# Patient Record
Sex: Female | Born: 1940 | Race: White | Hispanic: No | Smoking: Former smoker
Health system: Southern US, Community
[De-identification: ages and names within clinical notes are randomized; demographics above are authoritative.]

## PROBLEM LIST (undated history)

## (undated) DIAGNOSIS — Z8709 Personal history of other diseases of the respiratory system: Secondary | ICD-10-CM

## (undated) DIAGNOSIS — R519 Headache, unspecified: Secondary | ICD-10-CM

## (undated) DIAGNOSIS — G8929 Other chronic pain: Secondary | ICD-10-CM

## (undated) DIAGNOSIS — M1990 Unspecified osteoarthritis, unspecified site: Secondary | ICD-10-CM

## (undated) DIAGNOSIS — J449 Chronic obstructive pulmonary disease, unspecified: Secondary | ICD-10-CM

## (undated) DIAGNOSIS — Z8739 Personal history of other diseases of the musculoskeletal system and connective tissue: Secondary | ICD-10-CM

## (undated) DIAGNOSIS — R531 Weakness: Secondary | ICD-10-CM

## (undated) DIAGNOSIS — D649 Anemia, unspecified: Secondary | ICD-10-CM

## (undated) DIAGNOSIS — M51369 Other intervertebral disc degeneration, lumbar region without mention of lumbar back pain or lower extremity pain: Secondary | ICD-10-CM

## (undated) DIAGNOSIS — M2550 Pain in unspecified joint: Secondary | ICD-10-CM

## (undated) DIAGNOSIS — K219 Gastro-esophageal reflux disease without esophagitis: Secondary | ICD-10-CM

## (undated) DIAGNOSIS — R011 Cardiac murmur, unspecified: Secondary | ICD-10-CM

## (undated) DIAGNOSIS — M8580 Other specified disorders of bone density and structure, unspecified site: Secondary | ICD-10-CM

## (undated) DIAGNOSIS — I10 Essential (primary) hypertension: Secondary | ICD-10-CM

## (undated) DIAGNOSIS — M810 Age-related osteoporosis without current pathological fracture: Secondary | ICD-10-CM

## (undated) DIAGNOSIS — F329 Major depressive disorder, single episode, unspecified: Secondary | ICD-10-CM

## (undated) DIAGNOSIS — F419 Anxiety disorder, unspecified: Secondary | ICD-10-CM

## (undated) DIAGNOSIS — F32A Depression, unspecified: Secondary | ICD-10-CM

## (undated) DIAGNOSIS — R51 Headache: Secondary | ICD-10-CM

## (undated) DIAGNOSIS — Z8719 Personal history of other diseases of the digestive system: Secondary | ICD-10-CM

## (undated) DIAGNOSIS — J82 Pulmonary eosinophilia, not elsewhere classified: Secondary | ICD-10-CM

## (undated) DIAGNOSIS — S12100A Unspecified displaced fracture of second cervical vertebra, initial encounter for closed fracture: Secondary | ICD-10-CM

## (undated) DIAGNOSIS — M5136 Other intervertebral disc degeneration, lumbar region: Secondary | ICD-10-CM

## (undated) DIAGNOSIS — M660 Rupture of popliteal cyst: Secondary | ICD-10-CM

## (undated) DIAGNOSIS — N390 Urinary tract infection, site not specified: Secondary | ICD-10-CM

## (undated) DIAGNOSIS — T7840XA Allergy, unspecified, initial encounter: Secondary | ICD-10-CM

## (undated) DIAGNOSIS — H269 Unspecified cataract: Secondary | ICD-10-CM

## (undated) DIAGNOSIS — R32 Unspecified urinary incontinence: Secondary | ICD-10-CM

## (undated) DIAGNOSIS — J189 Pneumonia, unspecified organism: Secondary | ICD-10-CM

## (undated) DIAGNOSIS — R42 Dizziness and giddiness: Secondary | ICD-10-CM

## (undated) HISTORY — DX: Age-related osteoporosis without current pathological fracture: M81.0

## (undated) HISTORY — DX: Anxiety disorder, unspecified: F41.9

## (undated) HISTORY — PX: ADENOIDECTOMY: SUR15

## (undated) HISTORY — DX: Unspecified osteoarthritis, unspecified site: M19.90

## (undated) HISTORY — DX: Headache, unspecified: R51.9

## (undated) HISTORY — DX: Major depressive disorder, single episode, unspecified: F32.9

## (undated) HISTORY — PX: TONSILLECTOMY: SUR1361

## (undated) HISTORY — DX: Other chronic pain: G89.29

## (undated) HISTORY — PX: NASAL SINUS SURGERY: SHX719

## (undated) HISTORY — PX: CATARACT EXTRACTION W/ INTRAOCULAR LENS  IMPLANT, BILATERAL: SHX1307

## (undated) HISTORY — DX: Other intervertebral disc degeneration, lumbar region: M51.36

## (undated) HISTORY — DX: Rupture of popliteal cyst: M66.0

## (undated) HISTORY — DX: Unspecified displaced fracture of second cervical vertebra, initial encounter for closed fracture: S12.100A

## (undated) HISTORY — DX: Allergy, unspecified, initial encounter: T78.40XA

## (undated) HISTORY — DX: Unspecified cataract: H26.9

## (undated) HISTORY — DX: Headache: R51

## (undated) HISTORY — PX: ESOPHAGEAL DILATION: SHX303

## (undated) HISTORY — DX: Depression, unspecified: F32.A

## (undated) HISTORY — DX: Gastro-esophageal reflux disease without esophagitis: K21.9

## (undated) HISTORY — DX: Essential (primary) hypertension: I10

## (undated) HISTORY — PX: POLYPECTOMY: SHX149

## (undated) HISTORY — PX: COLONOSCOPY: SHX174

## (undated) HISTORY — PX: TONSILLECTOMY AND ADENOIDECTOMY: SHX28

## (undated) HISTORY — DX: Other intervertebral disc degeneration, lumbar region without mention of lumbar back pain or lower extremity pain: M51.369

## (undated) HISTORY — DX: Anemia, unspecified: D64.9

## (undated) HISTORY — PX: UPPER GASTROINTESTINAL ENDOSCOPY: SHX188

## (undated) HISTORY — PX: TOTAL ABDOMINAL HYSTERECTOMY: SHX209

## (undated) HISTORY — DX: Pulmonary eosinophilia, not elsewhere classified: J82

## (undated) HISTORY — DX: Chronic obstructive pulmonary disease, unspecified: J44.9

## (undated) HISTORY — PX: APPENDECTOMY: SHX54

## (undated) HISTORY — DX: Other specified disorders of bone density and structure, unspecified site: M85.80

---

## 1998-07-09 ENCOUNTER — Ambulatory Visit (HOSPITAL_COMMUNITY): Admission: RE | Admit: 1998-07-09 | Discharge: 1998-07-09 | Payer: Self-pay | Admitting: Internal Medicine

## 1998-08-29 ENCOUNTER — Other Ambulatory Visit: Admission: RE | Admit: 1998-08-29 | Discharge: 1998-08-29 | Payer: Self-pay | Admitting: Otolaryngology

## 1999-10-29 ENCOUNTER — Ambulatory Visit (HOSPITAL_COMMUNITY): Admission: RE | Admit: 1999-10-29 | Discharge: 1999-10-29 | Payer: Self-pay | Admitting: Internal Medicine

## 2005-05-09 ENCOUNTER — Ambulatory Visit: Payer: Self-pay | Admitting: Internal Medicine

## 2005-05-20 ENCOUNTER — Ambulatory Visit: Payer: Self-pay | Admitting: Internal Medicine

## 2005-06-02 ENCOUNTER — Ambulatory Visit: Payer: Self-pay | Admitting: Internal Medicine

## 2005-06-12 ENCOUNTER — Ambulatory Visit: Payer: Self-pay | Admitting: Internal Medicine

## 2005-09-23 ENCOUNTER — Ambulatory Visit: Payer: Self-pay | Admitting: Family Medicine

## 2006-09-15 ENCOUNTER — Ambulatory Visit: Payer: Self-pay | Admitting: Internal Medicine

## 2006-09-22 ENCOUNTER — Ambulatory Visit: Payer: Self-pay | Admitting: Internal Medicine

## 2006-11-05 ENCOUNTER — Ambulatory Visit: Payer: Self-pay | Admitting: Internal Medicine

## 2007-01-14 ENCOUNTER — Ambulatory Visit: Payer: Self-pay | Admitting: Internal Medicine

## 2007-06-07 ENCOUNTER — Encounter: Payer: Self-pay | Admitting: Internal Medicine

## 2007-06-09 DIAGNOSIS — K2100 Gastro-esophageal reflux disease with esophagitis, without bleeding: Secondary | ICD-10-CM | POA: Insufficient documentation

## 2007-06-09 DIAGNOSIS — M1990 Unspecified osteoarthritis, unspecified site: Secondary | ICD-10-CM | POA: Insufficient documentation

## 2007-06-30 ENCOUNTER — Ambulatory Visit: Payer: Self-pay | Admitting: Internal Medicine

## 2007-07-27 ENCOUNTER — Encounter: Payer: Self-pay | Admitting: Internal Medicine

## 2007-09-03 ENCOUNTER — Encounter: Payer: Self-pay | Admitting: Internal Medicine

## 2007-09-13 ENCOUNTER — Ambulatory Visit: Payer: Self-pay | Admitting: Family Medicine

## 2007-09-13 ENCOUNTER — Encounter (INDEPENDENT_AMBULATORY_CARE_PROVIDER_SITE_OTHER): Payer: Self-pay | Admitting: *Deleted

## 2007-09-29 ENCOUNTER — Ambulatory Visit: Payer: Self-pay | Admitting: Internal Medicine

## 2007-09-29 DIAGNOSIS — K219 Gastro-esophageal reflux disease without esophagitis: Secondary | ICD-10-CM | POA: Insufficient documentation

## 2007-09-30 ENCOUNTER — Encounter: Payer: Self-pay | Admitting: Internal Medicine

## 2007-10-04 ENCOUNTER — Encounter (INDEPENDENT_AMBULATORY_CARE_PROVIDER_SITE_OTHER): Payer: Self-pay | Admitting: *Deleted

## 2007-10-04 LAB — CONVERTED CEMR LAB
ALT: 29 units/L (ref 0–35)
AST: 34 units/L (ref 0–37)
Albumin: 4.1 g/dL (ref 3.5–5.2)
Alkaline Phosphatase: 65 units/L (ref 39–117)
BUN: 11 mg/dL (ref 6–23)
Basophils Absolute: 0 10*3/uL (ref 0.0–0.1)
Basophils Relative: 0.4 % (ref 0.0–1.0)
Bilirubin, Direct: 0.1 mg/dL (ref 0.0–0.3)
CO2: 31 meq/L (ref 19–32)
Calcium: 9.7 mg/dL (ref 8.4–10.5)
Chloride: 107 meq/L (ref 96–112)
Cholesterol: 197 mg/dL (ref 0–200)
Creatinine, Ser: 0.6 mg/dL (ref 0.4–1.2)
Eosinophils Absolute: 0.6 10*3/uL (ref 0.0–0.6)
Eosinophils Relative: 8.7 % — ABNORMAL HIGH (ref 0.0–5.0)
GFR calc Af Amer: 129 mL/min
GFR calc non Af Amer: 107 mL/min
Glucose, Bld: 102 mg/dL — ABNORMAL HIGH (ref 70–99)
HCT: 35.7 % — ABNORMAL LOW (ref 36.0–46.0)
HDL: 52.4 mg/dL (ref >39.0)
Hemoglobin: 12.6 g/dL (ref 12.0–15.0)
LDL Cholesterol: 113 mg/dL — ABNORMAL HIGH (ref 0–99)
Lymphocytes Relative: 20.7 % (ref 12.0–46.0)
MCHC: 35.3 g/dL (ref 30.0–36.0)
MCV: 94.6 fL (ref 78.0–100.0)
Monocytes Absolute: 0.6 10*3/uL (ref 0.2–0.7)
Monocytes Relative: 8.6 % (ref 3.0–11.0)
Neutro Abs: 4 10*3/uL (ref 1.4–7.7)
Neutrophils Relative %: 61.6 % (ref 43.0–77.0)
Platelets: 214 10*3/uL (ref 150–400)
Potassium: 4 meq/L (ref 3.5–5.1)
RBC: 3.78 M/uL — ABNORMAL LOW (ref 3.87–5.11)
RDW: 13.2 % (ref 11.5–14.6)
Sodium: 143 meq/L (ref 135–145)
TSH: 2.26 microintl units/mL (ref 0.35–5.50)
Total Bilirubin: 0.8 mg/dL (ref 0.3–1.2)
Total CHOL/HDL Ratio: 3.8
Total Protein: 7.6 g/dL (ref 6.0–8.3)
Triglycerides: 160 mg/dL — ABNORMAL HIGH (ref 0–149)
VLDL: 32 mg/dL (ref 0–40)
WBC: 6.6 10*3/uL (ref 4.5–10.5)

## 2007-10-22 ENCOUNTER — Ambulatory Visit: Payer: Self-pay | Admitting: Internal Medicine

## 2007-10-25 ENCOUNTER — Encounter (INDEPENDENT_AMBULATORY_CARE_PROVIDER_SITE_OTHER): Payer: Self-pay | Admitting: *Deleted

## 2007-11-02 ENCOUNTER — Ambulatory Visit: Payer: Self-pay | Admitting: Internal Medicine

## 2007-11-04 ENCOUNTER — Ambulatory Visit: Payer: Self-pay | Admitting: Internal Medicine

## 2007-11-09 ENCOUNTER — Ambulatory Visit: Payer: Self-pay | Admitting: Internal Medicine

## 2007-11-10 ENCOUNTER — Encounter (INDEPENDENT_AMBULATORY_CARE_PROVIDER_SITE_OTHER): Payer: Self-pay | Admitting: *Deleted

## 2007-11-10 LAB — CONVERTED CEMR LAB
Basophils Absolute: 0 10*3/uL (ref 0.0–0.1)
Basophils Relative: 0.7 % (ref 0.0–1.0)
Eosinophils Absolute: 0.7 10*3/uL — ABNORMAL HIGH (ref 0.0–0.6)
Eosinophils Relative: 12.2 % — ABNORMAL HIGH (ref 0.0–5.0)
Folate: 15.7 ng/mL
HCT: 36.6 % (ref 36.0–46.0)
Hemoglobin: 12.9 g/dL (ref 12.0–15.0)
Iron: 71 ug/dL (ref 42–145)
Lymphocytes Relative: 21.4 % (ref 12.0–46.0)
MCHC: 35.2 g/dL (ref 30.0–36.0)
MCV: 95.4 fL (ref 78.0–100.0)
Monocytes Absolute: 0.6 10*3/uL (ref 0.2–0.7)
Monocytes Relative: 10.3 % (ref 3.0–11.0)
Neutro Abs: 3.3 10*3/uL (ref 1.4–7.7)
Neutrophils Relative %: 55.4 % (ref 43.0–77.0)
Platelets: 214 10*3/uL (ref 150–400)
RBC: 3.83 M/uL — ABNORMAL LOW (ref 3.87–5.11)
RDW: 13.4 % (ref 11.5–14.6)
Vitamin B-12: 335 pg/mL (ref 211–911)
WBC: 5.9 10*3/uL (ref 4.5–10.5)

## 2007-11-19 ENCOUNTER — Ambulatory Visit: Payer: Self-pay | Admitting: Internal Medicine

## 2007-11-19 ENCOUNTER — Encounter: Payer: Self-pay | Admitting: Internal Medicine

## 2007-11-23 ENCOUNTER — Telehealth (INDEPENDENT_AMBULATORY_CARE_PROVIDER_SITE_OTHER): Payer: Self-pay | Admitting: *Deleted

## 2007-12-13 ENCOUNTER — Ambulatory Visit: Payer: Self-pay | Admitting: Internal Medicine

## 2008-02-14 ENCOUNTER — Telehealth: Payer: Self-pay | Admitting: Internal Medicine

## 2008-03-21 ENCOUNTER — Telehealth (INDEPENDENT_AMBULATORY_CARE_PROVIDER_SITE_OTHER): Payer: Self-pay | Admitting: *Deleted

## 2008-04-03 ENCOUNTER — Ambulatory Visit: Payer: Self-pay | Admitting: Internal Medicine

## 2008-04-16 LAB — CONVERTED CEMR LAB
Cholesterol: 195 mg/dL (ref 0–200)
Direct LDL: 102.7 mg/dL
HDL: 45.6 mg/dL (ref >39.0)
Total CHOL/HDL Ratio: 4.3
Triglycerides: 247 mg/dL (ref 0–149)
VLDL: 49 mg/dL — ABNORMAL HIGH (ref 0–40)

## 2008-04-17 ENCOUNTER — Encounter (INDEPENDENT_AMBULATORY_CARE_PROVIDER_SITE_OTHER): Payer: Self-pay | Admitting: *Deleted

## 2008-05-17 ENCOUNTER — Telehealth (INDEPENDENT_AMBULATORY_CARE_PROVIDER_SITE_OTHER): Payer: Self-pay | Admitting: *Deleted

## 2008-05-19 ENCOUNTER — Ambulatory Visit: Payer: Self-pay | Admitting: Internal Medicine

## 2008-05-19 DIAGNOSIS — T887XXA Unspecified adverse effect of drug or medicament, initial encounter: Secondary | ICD-10-CM | POA: Insufficient documentation

## 2008-05-19 DIAGNOSIS — R7989 Other specified abnormal findings of blood chemistry: Secondary | ICD-10-CM | POA: Insufficient documentation

## 2008-05-21 LAB — CONVERTED CEMR LAB
BUN: 10 mg/dL (ref 6–23)
Creatinine, Ser: 0.7 mg/dL (ref 0.4–1.2)
Hgb A1c MFr Bld: 5.8 % (ref 4.6–6.0)
Potassium: 4.4 meq/L (ref 3.5–5.1)

## 2008-05-23 ENCOUNTER — Encounter (INDEPENDENT_AMBULATORY_CARE_PROVIDER_SITE_OTHER): Payer: Self-pay | Admitting: *Deleted

## 2008-06-07 ENCOUNTER — Telehealth (INDEPENDENT_AMBULATORY_CARE_PROVIDER_SITE_OTHER): Payer: Self-pay | Admitting: *Deleted

## 2008-06-12 ENCOUNTER — Telehealth (INDEPENDENT_AMBULATORY_CARE_PROVIDER_SITE_OTHER): Payer: Self-pay | Admitting: *Deleted

## 2008-06-12 ENCOUNTER — Ambulatory Visit: Payer: Self-pay | Admitting: Internal Medicine

## 2008-06-12 DIAGNOSIS — R002 Palpitations: Secondary | ICD-10-CM | POA: Insufficient documentation

## 2008-06-26 ENCOUNTER — Ambulatory Visit: Payer: Self-pay | Admitting: Internal Medicine

## 2008-06-28 ENCOUNTER — Encounter (INDEPENDENT_AMBULATORY_CARE_PROVIDER_SITE_OTHER): Payer: Self-pay | Admitting: *Deleted

## 2008-06-28 LAB — CONVERTED CEMR LAB
BUN: 16 mg/dL (ref 6–23)
Calcium: 9.1 mg/dL (ref 8.4–10.5)
Creatinine, Ser: 0.8 mg/dL (ref 0.4–1.2)
Magnesium: 2.3 mg/dL (ref 1.5–2.5)
Potassium: 4.5 meq/L (ref 3.5–5.1)

## 2008-07-03 ENCOUNTER — Ambulatory Visit: Payer: Self-pay

## 2008-07-03 ENCOUNTER — Encounter: Payer: Self-pay | Admitting: Internal Medicine

## 2008-07-17 ENCOUNTER — Ambulatory Visit: Payer: Self-pay | Admitting: Internal Medicine

## 2008-07-17 ENCOUNTER — Telehealth (INDEPENDENT_AMBULATORY_CARE_PROVIDER_SITE_OTHER): Payer: Self-pay | Admitting: *Deleted

## 2008-07-21 ENCOUNTER — Telehealth (INDEPENDENT_AMBULATORY_CARE_PROVIDER_SITE_OTHER): Payer: Self-pay | Admitting: *Deleted

## 2008-08-04 ENCOUNTER — Ambulatory Visit: Payer: Self-pay | Admitting: Internal Medicine

## 2008-08-04 LAB — CONVERTED CEMR LAB
Cholesterol: 261 mg/dL (ref 0–200)
Direct LDL: 109.4 mg/dL
HDL: 49.1 mg/dL (ref >39.0)
Hgb A1c MFr Bld: 5.8 % (ref 4.6–6.0)
Total CHOL/HDL Ratio: 5.3
Triglycerides: 475 mg/dL (ref 0–149)
VLDL: 95 mg/dL — ABNORMAL HIGH (ref 0–40)

## 2008-08-07 ENCOUNTER — Telehealth (INDEPENDENT_AMBULATORY_CARE_PROVIDER_SITE_OTHER): Payer: Self-pay | Admitting: *Deleted

## 2008-08-09 ENCOUNTER — Ambulatory Visit: Payer: Self-pay | Admitting: Internal Medicine

## 2008-08-09 DIAGNOSIS — I10 Essential (primary) hypertension: Secondary | ICD-10-CM | POA: Insufficient documentation

## 2008-08-09 DIAGNOSIS — E781 Pure hyperglyceridemia: Secondary | ICD-10-CM | POA: Insufficient documentation

## 2008-08-10 ENCOUNTER — Encounter (INDEPENDENT_AMBULATORY_CARE_PROVIDER_SITE_OTHER): Payer: Self-pay | Admitting: *Deleted

## 2008-08-17 ENCOUNTER — Encounter: Payer: Self-pay | Admitting: Internal Medicine

## 2008-08-17 ENCOUNTER — Encounter: Admission: RE | Admit: 2008-08-17 | Discharge: 2008-09-19 | Payer: Self-pay | Admitting: Internal Medicine

## 2008-08-24 ENCOUNTER — Ambulatory Visit: Payer: Self-pay | Admitting: Cardiovascular Disease

## 2008-08-29 HISTORY — PX: SHOULDER SURGERY: SHX246

## 2008-09-06 ENCOUNTER — Ambulatory Visit: Payer: Self-pay

## 2008-09-06 ENCOUNTER — Encounter: Payer: Self-pay | Admitting: Cardiovascular Disease

## 2008-09-06 ENCOUNTER — Ambulatory Visit: Payer: Self-pay | Admitting: Cardiovascular Disease

## 2008-09-19 ENCOUNTER — Ambulatory Visit: Payer: Self-pay | Admitting: Cardiovascular Disease

## 2008-09-19 LAB — CONVERTED CEMR LAB
BUN: 11 mg/dL (ref 6–23)
CO2: 29 meq/L (ref 19–32)
Calcium: 9.6 mg/dL (ref 8.4–10.5)
Chloride: 105 meq/L (ref 96–112)
Creatinine, Ser: 0.7 mg/dL (ref 0.4–1.2)
GFR calc Af Amer: 108 mL/min
GFR calc non Af Amer: 89 mL/min
Glucose, Bld: 107 mg/dL — ABNORMAL HIGH (ref 70–99)
Potassium: 3.5 meq/L (ref 3.5–5.1)
Sodium: 140 meq/L (ref 135–145)

## 2008-09-29 ENCOUNTER — Ambulatory Visit: Payer: Self-pay | Admitting: Internal Medicine

## 2008-09-29 DIAGNOSIS — E785 Hyperlipidemia, unspecified: Secondary | ICD-10-CM | POA: Insufficient documentation

## 2008-09-29 DIAGNOSIS — F329 Major depressive disorder, single episode, unspecified: Secondary | ICD-10-CM | POA: Insufficient documentation

## 2008-09-29 DIAGNOSIS — F419 Anxiety disorder, unspecified: Secondary | ICD-10-CM | POA: Insufficient documentation

## 2008-09-29 DIAGNOSIS — R7309 Other abnormal glucose: Secondary | ICD-10-CM | POA: Insufficient documentation

## 2008-09-29 DIAGNOSIS — F411 Generalized anxiety disorder: Secondary | ICD-10-CM | POA: Insufficient documentation

## 2008-09-29 DIAGNOSIS — M25519 Pain in unspecified shoulder: Secondary | ICD-10-CM | POA: Insufficient documentation

## 2008-10-09 ENCOUNTER — Encounter (INDEPENDENT_AMBULATORY_CARE_PROVIDER_SITE_OTHER): Payer: Self-pay | Admitting: *Deleted

## 2008-10-09 ENCOUNTER — Telehealth (INDEPENDENT_AMBULATORY_CARE_PROVIDER_SITE_OTHER): Payer: Self-pay | Admitting: *Deleted

## 2008-10-09 LAB — CONVERTED CEMR LAB
ALT: 43 units/L — ABNORMAL HIGH (ref 0–35)
AST: 41 units/L — ABNORMAL HIGH (ref 0–37)
Albumin: 4.4 g/dL (ref 3.5–5.2)
Alkaline Phosphatase: 86 units/L (ref 39–117)
Basophils Absolute: 0 10*3/uL (ref 0.0–0.1)
Basophils Relative: 0.1 % (ref 0.0–3.0)
Bilirubin, Direct: 0.2 mg/dL (ref 0.0–0.3)
Cholesterol: 241 mg/dL (ref 0–200)
Direct LDL: 139.6 mg/dL
Eosinophils Absolute: 0.7 10*3/uL (ref 0.0–0.7)
Eosinophils Relative: 10.2 % — ABNORMAL HIGH (ref 0.0–5.0)
HCT: 37 % (ref 36.0–46.0)
HDL: 65.6 mg/dL (ref >39.0)
Hemoglobin: 12.6 g/dL (ref 12.0–15.0)
Hgb A1c MFr Bld: 5.3 % (ref 4.6–6.0)
Lymphocytes Relative: 18.4 % (ref 12.0–46.0)
MCHC: 34.1 g/dL (ref 30.0–36.0)
MCV: 96.5 fL (ref 78.0–100.0)
Monocytes Absolute: 0.6 10*3/uL (ref 0.1–1.0)
Monocytes Relative: 9.8 % (ref 3.0–12.0)
Neutro Abs: 3.9 10*3/uL (ref 1.4–7.7)
Neutrophils Relative %: 61.5 % (ref 43.0–77.0)
Platelets: 226 10*3/uL (ref 150–400)
RBC: 3.83 M/uL — ABNORMAL LOW (ref 3.87–5.11)
RDW: 14 % (ref 11.5–14.6)
TSH: 1.71 microintl units/mL (ref 0.35–5.50)
Total Bilirubin: 0.8 mg/dL (ref 0.3–1.2)
Total CHOL/HDL Ratio: 3.7
Total Protein: 7.8 g/dL (ref 6.0–8.3)
Triglycerides: 119 mg/dL (ref 0–149)
VLDL: 24 mg/dL (ref 0–40)
WBC: 6.4 10*3/uL (ref 4.5–10.5)

## 2008-10-13 ENCOUNTER — Encounter: Admission: RE | Admit: 2008-10-13 | Discharge: 2008-11-20 | Payer: Self-pay | Admitting: Internal Medicine

## 2008-10-19 ENCOUNTER — Encounter: Admission: RE | Admit: 2008-10-19 | Discharge: 2008-10-19 | Payer: Self-pay | Admitting: Internal Medicine

## 2008-10-24 ENCOUNTER — Encounter: Payer: Self-pay | Admitting: Internal Medicine

## 2008-10-24 ENCOUNTER — Telehealth (INDEPENDENT_AMBULATORY_CARE_PROVIDER_SITE_OTHER): Payer: Self-pay | Admitting: *Deleted

## 2008-10-26 ENCOUNTER — Telehealth (INDEPENDENT_AMBULATORY_CARE_PROVIDER_SITE_OTHER): Payer: Self-pay | Admitting: *Deleted

## 2008-10-26 ENCOUNTER — Encounter (INDEPENDENT_AMBULATORY_CARE_PROVIDER_SITE_OTHER): Payer: Self-pay | Admitting: *Deleted

## 2008-10-26 ENCOUNTER — Ambulatory Visit: Payer: Self-pay | Admitting: Family Medicine

## 2008-10-27 ENCOUNTER — Telehealth (INDEPENDENT_AMBULATORY_CARE_PROVIDER_SITE_OTHER): Payer: Self-pay | Admitting: *Deleted

## 2008-11-03 ENCOUNTER — Ambulatory Visit: Payer: Self-pay | Admitting: Cardiovascular Disease

## 2008-11-20 ENCOUNTER — Encounter: Payer: Self-pay | Admitting: Internal Medicine

## 2009-01-17 ENCOUNTER — Telehealth (INDEPENDENT_AMBULATORY_CARE_PROVIDER_SITE_OTHER): Payer: Self-pay | Admitting: *Deleted

## 2009-03-09 ENCOUNTER — Telehealth (INDEPENDENT_AMBULATORY_CARE_PROVIDER_SITE_OTHER): Payer: Self-pay | Admitting: *Deleted

## 2009-04-20 ENCOUNTER — Ambulatory Visit: Payer: Self-pay | Admitting: Family Medicine

## 2009-05-04 ENCOUNTER — Ambulatory Visit: Payer: Self-pay | Admitting: Internal Medicine

## 2009-05-04 DIAGNOSIS — M109 Gout, unspecified: Secondary | ICD-10-CM | POA: Insufficient documentation

## 2009-05-05 LAB — CONVERTED CEMR LAB
BUN: 14 mg/dL (ref 6–23)
Basophils Absolute: 0 10*3/uL (ref 0.0–0.1)
Basophils Relative: 0.1 % (ref 0.0–3.0)
Cholesterol: 183 mg/dL (ref 0–200)
Creatinine, Ser: 0.7 mg/dL (ref 0.4–1.2)
Eosinophils Absolute: 0.4 10*3/uL (ref 0.0–0.7)
Eosinophils Relative: 7.4 % — ABNORMAL HIGH (ref 0.0–5.0)
HCT: 36 % (ref 36.0–46.0)
HDL: 59.5 mg/dL (ref >39.00)
Hemoglobin: 12.3 g/dL (ref 12.0–15.0)
Hgb A1c MFr Bld: 5.4 % (ref 4.6–6.5)
LDL Cholesterol: 103 mg/dL — ABNORMAL HIGH (ref 0–99)
Lymphocytes Relative: 18.5 % (ref 12.0–46.0)
Lymphs Abs: 1 10*3/uL (ref 0.7–4.0)
MCHC: 34.3 g/dL (ref 30.0–36.0)
MCV: 94.6 fL (ref 78.0–100.0)
Monocytes Absolute: 0.5 10*3/uL (ref 0.1–1.0)
Monocytes Relative: 9.4 % (ref 3.0–12.0)
Neutro Abs: 3.6 10*3/uL (ref 1.4–7.7)
Neutrophils Relative %: 64.6 % (ref 43.0–77.0)
Platelets: 167 10*3/uL (ref 150.0–400.0)
Potassium: 4.4 meq/L (ref 3.5–5.1)
RBC: 3.8 M/uL — ABNORMAL LOW (ref 3.87–5.11)
RDW: 13.4 % (ref 11.5–14.6)
Total CHOL/HDL Ratio: 3
Triglycerides: 103 mg/dL (ref 0.0–149.0)
Uric Acid, Serum: 4 mg/dL (ref 2.4–7.0)
VLDL: 20.6 mg/dL (ref 0.0–40.0)
WBC: 5.5 10*3/uL (ref 4.5–10.5)

## 2009-05-07 ENCOUNTER — Encounter (INDEPENDENT_AMBULATORY_CARE_PROVIDER_SITE_OTHER): Payer: Self-pay | Admitting: *Deleted

## 2009-05-12 ENCOUNTER — Encounter: Payer: Self-pay | Admitting: Internal Medicine

## 2009-05-16 ENCOUNTER — Telehealth (INDEPENDENT_AMBULATORY_CARE_PROVIDER_SITE_OTHER): Payer: Self-pay | Admitting: *Deleted

## 2009-06-22 ENCOUNTER — Encounter: Payer: Self-pay | Admitting: Family Medicine

## 2009-09-08 ENCOUNTER — Encounter: Payer: Self-pay | Admitting: Internal Medicine

## 2009-10-15 ENCOUNTER — Telehealth (INDEPENDENT_AMBULATORY_CARE_PROVIDER_SITE_OTHER): Payer: Self-pay | Admitting: *Deleted

## 2009-11-02 ENCOUNTER — Encounter: Payer: Self-pay | Admitting: Internal Medicine

## 2009-11-09 ENCOUNTER — Encounter (INDEPENDENT_AMBULATORY_CARE_PROVIDER_SITE_OTHER): Payer: Self-pay | Admitting: *Deleted

## 2009-11-27 ENCOUNTER — Encounter: Payer: Self-pay | Admitting: Internal Medicine

## 2009-11-27 DIAGNOSIS — N63 Unspecified lump in unspecified breast: Secondary | ICD-10-CM | POA: Insufficient documentation

## 2009-12-10 ENCOUNTER — Telehealth: Payer: Self-pay | Admitting: Internal Medicine

## 2009-12-26 ENCOUNTER — Ambulatory Visit: Payer: Self-pay | Admitting: Internal Medicine

## 2009-12-26 DIAGNOSIS — M26629 Arthralgia of temporomandibular joint, unspecified side: Secondary | ICD-10-CM | POA: Insufficient documentation

## 2009-12-27 ENCOUNTER — Encounter (INDEPENDENT_AMBULATORY_CARE_PROVIDER_SITE_OTHER): Payer: Self-pay | Admitting: *Deleted

## 2010-01-01 ENCOUNTER — Ambulatory Visit: Payer: Self-pay | Admitting: Internal Medicine

## 2010-01-01 DIAGNOSIS — M8580 Other specified disorders of bone density and structure, unspecified site: Secondary | ICD-10-CM | POA: Insufficient documentation

## 2010-01-01 DIAGNOSIS — H15009 Unspecified scleritis, unspecified eye: Secondary | ICD-10-CM | POA: Insufficient documentation

## 2010-01-04 ENCOUNTER — Telehealth (INDEPENDENT_AMBULATORY_CARE_PROVIDER_SITE_OTHER): Payer: Self-pay | Admitting: *Deleted

## 2010-05-02 ENCOUNTER — Telehealth (INDEPENDENT_AMBULATORY_CARE_PROVIDER_SITE_OTHER): Payer: Self-pay | Admitting: *Deleted

## 2010-05-20 ENCOUNTER — Telehealth (INDEPENDENT_AMBULATORY_CARE_PROVIDER_SITE_OTHER): Payer: Self-pay | Admitting: *Deleted

## 2010-05-30 ENCOUNTER — Telehealth (INDEPENDENT_AMBULATORY_CARE_PROVIDER_SITE_OTHER): Payer: Self-pay | Admitting: *Deleted

## 2010-06-04 ENCOUNTER — Telehealth (INDEPENDENT_AMBULATORY_CARE_PROVIDER_SITE_OTHER): Payer: Self-pay | Admitting: *Deleted

## 2010-07-17 ENCOUNTER — Telehealth (INDEPENDENT_AMBULATORY_CARE_PROVIDER_SITE_OTHER): Payer: Self-pay | Admitting: *Deleted

## 2010-11-04 ENCOUNTER — Telehealth (INDEPENDENT_AMBULATORY_CARE_PROVIDER_SITE_OTHER): Payer: Self-pay | Admitting: *Deleted

## 2010-11-06 ENCOUNTER — Ambulatory Visit: Payer: Self-pay | Admitting: Internal Medicine

## 2010-11-06 ENCOUNTER — Telehealth: Payer: Self-pay | Admitting: Internal Medicine

## 2010-11-07 ENCOUNTER — Encounter: Payer: Self-pay | Admitting: Internal Medicine

## 2010-11-11 ENCOUNTER — Ambulatory Visit: Payer: Self-pay | Admitting: Internal Medicine

## 2010-11-15 ENCOUNTER — Ambulatory Visit: Payer: Self-pay | Admitting: Internal Medicine

## 2010-11-15 ENCOUNTER — Encounter (INDEPENDENT_AMBULATORY_CARE_PROVIDER_SITE_OTHER): Payer: Self-pay | Admitting: *Deleted

## 2010-11-19 LAB — CONVERTED CEMR LAB
Basophils Absolute: 0 10*3/uL (ref 0.0–0.1)
Basophils Relative: 0 % (ref 0–1)
Eosinophils Absolute: 0.2 10*3/uL (ref 0.0–0.7)
Eosinophils Relative: 2 % (ref 0–5)
HCT: 40.5 % (ref 36.0–46.0)
Hemoglobin: 13.3 g/dL (ref 12.0–15.0)
Hgb A1c MFr Bld: 6.2 % — ABNORMAL HIGH (ref <5.7)
Lymphocytes Relative: 25 % (ref 12–46)
Lymphs Abs: 2.6 10*3/uL (ref 0.7–4.0)
MCHC: 32.8 g/dL (ref 30.0–36.0)
MCV: 93.1 fL (ref 78.0–100.0)
Monocytes Absolute: 1.5 10*3/uL — ABNORMAL HIGH (ref 0.1–1.0)
Monocytes Relative: 15 % — ABNORMAL HIGH (ref 3–12)
Neutro Abs: 5.8 10*3/uL (ref 1.7–7.7)
Neutrophils Relative %: 58 % (ref 43–77)
Platelets: 363 10*3/uL (ref 150–400)
RBC: 4.35 M/uL (ref 3.87–5.11)
RDW: 14.4 % (ref 11.5–15.5)
WBC: 10.1 10*3/uL (ref 4.0–10.5)

## 2010-11-25 ENCOUNTER — Telehealth: Payer: Self-pay | Admitting: Internal Medicine

## 2010-11-25 ENCOUNTER — Ambulatory Visit: Payer: Self-pay | Admitting: Internal Medicine

## 2010-11-28 ENCOUNTER — Ambulatory Visit: Payer: Self-pay | Admitting: Internal Medicine

## 2010-11-28 DIAGNOSIS — J45998 Other asthma: Secondary | ICD-10-CM | POA: Insufficient documentation

## 2010-11-28 DIAGNOSIS — R5383 Other fatigue: Secondary | ICD-10-CM

## 2010-11-28 DIAGNOSIS — R5381 Other malaise: Secondary | ICD-10-CM | POA: Insufficient documentation

## 2010-11-28 DIAGNOSIS — R739 Hyperglycemia, unspecified: Secondary | ICD-10-CM | POA: Insufficient documentation

## 2010-12-02 LAB — CONVERTED CEMR LAB
BUN: 15 mg/dL (ref 6–23)
Basophils Absolute: 0 10*3/uL (ref 0.0–0.1)
Basophils Relative: 0.4 % (ref 0.0–3.0)
CO2: 27 meq/L (ref 19–32)
Calcium: 8.9 mg/dL (ref 8.4–10.5)
Chloride: 104 meq/L (ref 96–112)
Cortisol, Plasma: 12 ug/dL
Creatinine, Ser: 0.9 mg/dL (ref 0.4–1.2)
Eosinophils Absolute: 1.1 10*3/uL — ABNORMAL HIGH (ref 0.0–0.7)
Eosinophils Relative: 13.7 % — ABNORMAL HIGH (ref 0.0–5.0)
GFR calc non Af Amer: 70.78 mL/min (ref >60)
Glucose, Bld: 98 mg/dL (ref 70–99)
HCT: 38.5 % (ref 36.0–46.0)
Hemoglobin: 12.8 g/dL (ref 12.0–15.0)
Lymphocytes Relative: 15.6 % (ref 12.0–46.0)
Lymphs Abs: 1.2 10*3/uL (ref 0.7–4.0)
MCHC: 33.3 g/dL (ref 30.0–36.0)
MCV: 93.7 fL (ref 78.0–100.0)
Monocytes Absolute: 0.7 10*3/uL (ref 0.1–1.0)
Monocytes Relative: 9 % (ref 3.0–12.0)
Neutro Abs: 4.7 10*3/uL (ref 1.4–7.7)
Neutrophils Relative %: 61.3 % (ref 43.0–77.0)
Platelets: 174 10*3/uL (ref 150.0–400.0)
Potassium: 4.2 meq/L (ref 3.5–5.1)
RBC: 4.11 M/uL (ref 3.87–5.11)
RDW: 14.9 % — ABNORMAL HIGH (ref 11.5–14.6)
Sodium: 139 meq/L (ref 135–145)
TSH: 2.7 microintl units/mL (ref 0.35–5.50)
WBC: 7.7 10*3/uL (ref 4.5–10.5)

## 2010-12-03 LAB — CONVERTED CEMR LAB
IgA: 156 mg/dL (ref 68–378)
IgG (Immunoglobin G), Serum: 1010 mg/dL (ref 694–1618)
IgM, Serum: 257 mg/dL (ref 60–263)
Total Protein, Serum Electrophoresis: 7.2 g/dL (ref 6.0–8.3)

## 2010-12-11 ENCOUNTER — Ambulatory Visit: Payer: Self-pay | Admitting: Pulmonary Disease

## 2010-12-11 ENCOUNTER — Encounter: Payer: Self-pay | Admitting: Pulmonary Disease

## 2010-12-12 ENCOUNTER — Encounter: Payer: Self-pay | Admitting: Pulmonary Disease

## 2010-12-13 ENCOUNTER — Encounter: Payer: Self-pay | Admitting: Internal Medicine

## 2010-12-13 LAB — CONVERTED CEMR LAB
ALT: 29 units/L (ref 0–35)
AST: 34 units/L (ref 0–37)
Albumin: 4.2 g/dL (ref 3.5–5.2)
Alkaline Phosphatase: 64 units/L (ref 39–117)
BUN: 12 mg/dL (ref 6–23)
Basophils Absolute: 0 10*3/uL (ref 0.0–0.1)
Basophils Relative: 0.6 % (ref 0.0–3.0)
Bilirubin, Direct: 0.1 mg/dL (ref 0.0–0.3)
CO2: 29 meq/L (ref 19–32)
Calcium: 9.6 mg/dL (ref 8.4–10.5)
Chloride: 102 meq/L (ref 96–112)
Creatinine, Ser: 0.7 mg/dL (ref 0.4–1.2)
Eosinophils Absolute: 0.5 10*3/uL (ref 0.0–0.7)
Eosinophils Relative: 7 % — ABNORMAL HIGH (ref 0.0–5.0)
GFR calc non Af Amer: 83.04 mL/min (ref >60.00)
Glucose, Bld: 105 mg/dL — ABNORMAL HIGH (ref 70–99)
HCT: 37.8 % (ref 36.0–46.0)
Hemoglobin: 12.9 g/dL (ref 12.0–15.0)
Lymphocytes Relative: 16 % (ref 12.0–46.0)
Lymphs Abs: 1 10*3/uL (ref 0.7–4.0)
MCHC: 34.1 g/dL (ref 30.0–36.0)
MCV: 91.4 fL (ref 78.0–100.0)
Monocytes Absolute: 0.6 10*3/uL (ref 0.1–1.0)
Monocytes Relative: 9.7 % (ref 3.0–12.0)
Neutro Abs: 4.4 10*3/uL (ref 1.4–7.7)
Neutrophils Relative %: 66.7 % (ref 43.0–77.0)
Platelets: 235 10*3/uL (ref 150.0–400.0)
Potassium: 4.5 meq/L (ref 3.5–5.1)
RBC: 4.13 M/uL (ref 3.87–5.11)
RDW: 14.7 % — ABNORMAL HIGH (ref 11.5–14.6)
Sed Rate: 31 mm/hr — ABNORMAL HIGH (ref 0–22)
Sodium: 140 meq/L (ref 135–145)
Total Bilirubin: 0.4 mg/dL (ref 0.3–1.2)
Total Protein: 7.3 g/dL (ref 6.0–8.3)
WBC: 6.5 10*3/uL (ref 4.5–10.5)

## 2010-12-18 ENCOUNTER — Telehealth (INDEPENDENT_AMBULATORY_CARE_PROVIDER_SITE_OTHER): Payer: Self-pay | Admitting: *Deleted

## 2010-12-29 DIAGNOSIS — M660 Rupture of popliteal cyst: Secondary | ICD-10-CM

## 2010-12-29 DIAGNOSIS — J8281 Chronic eosinophilic pneumonia: Secondary | ICD-10-CM

## 2010-12-29 HISTORY — DX: Rupture of popliteal cyst: M66.0

## 2010-12-29 HISTORY — PX: BRONCHOSCOPY: SUR163

## 2010-12-29 HISTORY — DX: Chronic eosinophilic pneumonia: J82.81

## 2011-01-09 ENCOUNTER — Ambulatory Visit
Admission: RE | Admit: 2011-01-09 | Discharge: 2011-01-09 | Payer: Self-pay | Source: Home / Self Care | Attending: Pulmonary Disease | Admitting: Pulmonary Disease

## 2011-01-13 ENCOUNTER — Encounter: Payer: Self-pay | Admitting: Pulmonary Disease

## 2011-01-13 ENCOUNTER — Ambulatory Visit: Admit: 2011-01-13 | Payer: Self-pay | Admitting: Pulmonary Disease

## 2011-01-13 ENCOUNTER — Telehealth (INDEPENDENT_AMBULATORY_CARE_PROVIDER_SITE_OTHER): Payer: Self-pay | Admitting: *Deleted

## 2011-01-14 ENCOUNTER — Other Ambulatory Visit: Payer: Self-pay | Admitting: Pulmonary Disease

## 2011-01-14 ENCOUNTER — Encounter: Payer: Self-pay | Admitting: Pulmonary Disease

## 2011-01-14 LAB — CBC WITH DIFFERENTIAL/PLATELET
Basophils Absolute: 0.1 10*3/uL (ref 0.0–0.1)
Basophils Relative: 0.4 % (ref 0.0–3.0)
Eosinophils Absolute: 5 10*3/uL — ABNORMAL HIGH (ref 0.0–0.7)
Eosinophils Relative: 39.3 % — ABNORMAL HIGH (ref 0.0–5.0)
HCT: 35.5 % — ABNORMAL LOW (ref 36.0–46.0)
Hemoglobin: 12.1 g/dL (ref 12.0–15.0)
Lymphocytes Relative: 11.7 % — ABNORMAL LOW (ref 12.0–46.0)
Lymphs Abs: 1.5 10*3/uL (ref 0.7–4.0)
MCHC: 34.1 g/dL (ref 30.0–36.0)
MCV: 90.9 fl (ref 78.0–100.0)
Monocytes Absolute: 0.9 10*3/uL (ref 0.1–1.0)
Monocytes Relative: 6.9 % (ref 3.0–12.0)
Neutro Abs: 5.3 10*3/uL (ref 1.4–7.7)
Neutrophils Relative %: 41.7 % — ABNORMAL LOW (ref 43.0–77.0)
Platelets: 313 10*3/uL (ref 150.0–400.0)
RBC: 3.9 Mil/uL (ref 3.87–5.11)
RDW: 15 % — ABNORMAL HIGH (ref 11.5–14.6)
WBC: 12.7 10*3/uL — ABNORMAL HIGH (ref 4.5–10.5)

## 2011-01-14 LAB — CONVERTED CEMR LAB
ANA Titer 1: 1:40 {titer} — ABNORMAL HIGH
Anti Nuclear Antibody(ANA): POSITIVE — AB
HIV: NONREACTIVE
Rhuematoid fact SerPl-aCnc: <10 intl units/mL (ref <=14)

## 2011-01-14 LAB — BASIC METABOLIC PANEL
BUN: 13 mg/dL (ref 6–23)
CO2: 29 mEq/L (ref 19–32)
Calcium: 9.2 mg/dL (ref 8.4–10.5)
Chloride: 102 mEq/L (ref 96–112)
Creatinine, Ser: 0.8 mg/dL (ref 0.4–1.2)
GFR: 72.72 mL/min (ref >60.00)
Glucose, Bld: 79 mg/dL (ref 70–99)
Potassium: 4.4 mEq/L (ref 3.5–5.1)
Sodium: 140 mEq/L (ref 135–145)

## 2011-01-14 LAB — HEPATIC FUNCTION PANEL
ALT: 18 U/L (ref 0–35)
AST: 24 U/L (ref 0–37)
Albumin: 3.6 g/dL (ref 3.5–5.2)
Alkaline Phosphatase: 64 U/L (ref 39–117)
Bilirubin, Direct: 0.1 mg/dL (ref 0.0–0.3)
Total Bilirubin: 0.4 mg/dL (ref 0.3–1.2)
Total Protein: 6.8 g/dL (ref 6.0–8.3)

## 2011-01-14 LAB — BRAIN NATRIURETIC PEPTIDE: Pro B Natriuretic peptide (BNP): 37 pg/mL (ref 0.0–100.0)

## 2011-01-14 LAB — SEDIMENTATION RATE: Sed Rate: 69 mm/hr — ABNORMAL HIGH (ref 0–22)

## 2011-01-16 ENCOUNTER — Ambulatory Visit: Payer: Self-pay | Admitting: Internal Medicine

## 2011-01-21 ENCOUNTER — Encounter: Payer: Self-pay | Admitting: Pulmonary Disease

## 2011-01-21 ENCOUNTER — Ambulatory Visit (HOSPITAL_COMMUNITY)
Admission: RE | Admit: 2011-01-21 | Discharge: 2011-01-21 | Payer: Self-pay | Source: Home / Self Care | Attending: Pulmonary Disease | Admitting: Pulmonary Disease

## 2011-01-21 ENCOUNTER — Telehealth: Payer: Self-pay | Admitting: Pulmonary Disease

## 2011-01-22 LAB — FUNGAL STAIN: Fungal Smear: NONE SEEN

## 2011-01-22 LAB — BODY FLUID CELL COUNT WITH DIFFERENTIAL
Eos, Fluid: 12 %
Eos, Fluid: 48 %
Lymphs, Fluid: 12 %
Lymphs, Fluid: 2 %
Monocyte-Macrophage-Serous Fluid: 17 % — ABNORMAL LOW (ref 50–90)
Monocyte-Macrophage-Serous Fluid: 2 % — ABNORMAL LOW (ref 50–90)
Neutrophil Count, Fluid: 38 % — ABNORMAL HIGH (ref 0–25)
Neutrophil Count, Fluid: 69 % — ABNORMAL HIGH (ref 0–25)
Total Nucleated Cell Count, Fluid: 127 cu mm (ref 0–1000)
Total Nucleated Cell Count, Fluid: 720 cu mm (ref 0–1000)

## 2011-01-22 LAB — AFB STAIN: Acid Fast Smear: NONE SEEN

## 2011-01-22 LAB — PATHOLOGIST SMEAR REVIEW

## 2011-01-23 ENCOUNTER — Telehealth: Payer: Self-pay | Admitting: Pulmonary Disease

## 2011-01-23 LAB — CULTURE, RESPIRATORY W GRAM STAIN
Culture: NO GROWTH
Culture: NO GROWTH
Gram Stain: NONE SEEN
Gram Stain: NONE SEEN

## 2011-01-23 LAB — CULTURE, RESPIRATORY

## 2011-01-24 NOTE — Op Note (Signed)
Lisa Larson, Lisa Larson NO.:  [id number]  MEDICAL RECORD NO.:  [id number]          PATIENT TYPE:  AMB  LOCATION:  OMED                         FACILITY:  [facility]  PHYSICIAN:  [name], MD        DATE OF BIRTH:  28-Sep-1941  DATE OF PROCEDURE:  01/21/2011 DATE OF DISCHARGE:                              OPERATIVE REPORT   PREOPERATIVE DIAGNOSIS:  Nonresolving pneumonia, rule out eosinophilic pneumonia.  POSTOPERATIVE DIAGNOSIS:  Nonresolving pneumonia, rule out eosinophilic pneumonia.  PROCEDURE:  Bronchoscopy with BAL from the left lower lobe and right upper lobe and bronchial brushing from the left upper lobe.  INDICATION:  [name] is a 70 year old female who has a history of asthma.  She was diagnosed with pneumonia in November 2011.  She had been on multiple courses of antibiotics and prednisone with nonresolving pulmonary infiltrates.  Her peripheral smear showed greater than 35% eosinophils.  As a result, she is scheduled for bronchoscopy with airway sampling.  DESCRIPTION OF PROCEDURE:  The procedure explained to the patient. Risks were detailed as bleeding, infection, pneumothorax, and nondiagnosis.  Consent form was signed.  The patient was brought to the bronchoscopy suite.  She was given a total of 6 mg of Versed and 100 mcg of fentanyl intravenously for sedation and analgesia.  She had a total of 14 mL of 1% lidocaine instilled for topical anesthesia of her airways.  The bronchoscope was entered orally.  The vocal cords visualized and appeared to have normal motion.  The bronchoscope was then entered in the trachea.  The carina was visualized.  There is no obvious widening of the carina.  The bronchoscope was then entered in the right main bronchus.  The right upper, middle, and lower lobe orifices were all visualized.  The mucosa appeared somewhat inflamed, but there are no obvious endobronchial lesions.  Then, 120 mL of saline was  instilled to the right upper lobe with approximately 20 mL of pink fluid returned.  The bronchoscope was then entered into the left main bronchus.  The left upper lingular and lower lobe orifices were visualized.  There appeared to be inflammation mostly involving the lingular orifice and the superior segment of the left lower lobe.  The 120 mL of saline was instilled to the left lower lobe with approximately 12 mL of pink to bloody fluid returned.  Then brush cytology was performed from the left upper lobe.  The airways from the left side appeared to be somewhat friable with easy bleeding with any irritation. The airways were carefully inspected, and there did not appear to be any significant complications.  The bronchoscope was then withdrawn.  The patient returned to the recovery room in stable condition.  The specimens will be sent for cytology, microbiology, cell count with differential, and histopathology.  I will call the patient with the results of her bronchoscopy and further therapeutic or diagnostic interventions will be determined based on this.     [name], MD     VS/MEDQ  D:  01/21/2011  T:  01/21/2011  Job:  [id number]  Electronically Signed by [name] MD on 01/22/2011 08:20:44  AM

## 2011-01-26 LAB — CONVERTED CEMR LAB
ALT: 27 units/L (ref 0–35)
AST: 33 units/L (ref 0–37)
Albumin: 4.5 g/dL (ref 3.5–5.2)
Alkaline Phosphatase: 59 units/L (ref 39–117)
BUN: 16 mg/dL (ref 6–23)
Basophils Absolute: 0 10*3/uL (ref 0.0–0.1)
Basophils Relative: 0.1 % (ref 0.0–3.0)
Bilirubin, Direct: 0 mg/dL (ref 0.0–0.3)
CO2: 28 meq/L (ref 19–32)
Calcium: 9.7 mg/dL (ref 8.4–10.5)
Chloride: 102 meq/L (ref 96–112)
Cholesterol: 205 mg/dL — ABNORMAL HIGH (ref 0–200)
Creatinine, Ser: 0.8 mg/dL (ref 0.4–1.2)
Direct LDL: 107.3 mg/dL
Eosinophils Absolute: 0.7 10*3/uL (ref 0.0–0.7)
Eosinophils Relative: 8.8 % — ABNORMAL HIGH (ref 0.0–5.0)
GFR calc non Af Amer: 76.12 mL/min (ref >60)
Glucose, Bld: 90 mg/dL (ref 70–99)
HCT: 41.6 % (ref 36.0–46.0)
HDL: 62.5 mg/dL (ref >39.00)
Hemoglobin: 13.9 g/dL (ref 12.0–15.0)
Hgb A1c MFr Bld: 5.4 % (ref 4.6–6.5)
Lymphocytes Relative: 19.5 % (ref 12.0–46.0)
Lymphs Abs: 1.5 10*3/uL (ref 0.7–4.0)
MCHC: 33.4 g/dL (ref 30.0–36.0)
MCV: 96.9 fL (ref 78.0–100.0)
Monocytes Absolute: 0.6 10*3/uL (ref 0.1–1.0)
Monocytes Relative: 7.5 % (ref 3.0–12.0)
Neutro Abs: 4.9 10*3/uL (ref 1.4–7.7)
Neutrophils Relative %: 64.1 % (ref 43.0–77.0)
Platelets: 237 10*3/uL (ref 150.0–400.0)
Potassium: 4.3 meq/L (ref 3.5–5.1)
RBC: 4.29 M/uL (ref 3.87–5.11)
RDW: 13.4 % (ref 11.5–14.6)
Sodium: 139 meq/L (ref 135–145)
TSH: 2.74 microintl units/mL (ref 0.35–5.50)
Total Bilirubin: 0.8 mg/dL (ref 0.3–1.2)
Total CHOL/HDL Ratio: 3
Total Protein: 8 g/dL (ref 6.0–8.3)
Triglycerides: 180 mg/dL — ABNORMAL HIGH (ref 0.0–149.0)
Uric Acid, Serum: 3.8 mg/dL (ref 2.4–7.0)
VLDL: 36 mg/dL (ref 0.0–40.0)
Vit D, 25-Hydroxy: 48 ng/mL (ref 30–89)
WBC: 7.7 10*3/uL (ref 4.5–10.5)

## 2011-01-28 NOTE — Assessment & Plan Note (Signed)
Summary: blood pressure--tl   Vital Signs:  Patient Profile:   70 Years Old Female Weight:      159.8 pounds Temp:     98.0 degrees F oral Pulse rate:   60 / minute Resp:     15 per minute BP sitting:   132 / 80  (left arm) Cuff size:   large  Vitals Entered By: Shonna Chock (August 09, 2008 11:59 AM)             Comments STOPPED VERAPAMIL X 1 WEEK.Shonna Chock  August 09, 2008 12:02 PM  Compared cuffs, patient's cuff 124/81./[facility]  August 09, 2008 12:04 PM      PCP:  Laury Axon  Chief Complaint:  BLOOD PRESSURE CONCERS. B/P WILL RUN REAL HIGH THEN DROP REAL LOW.  History of Present Illness: She is still on Benicar samples ; Diovan Rx has not been started. She stopped Verapamil 4 days ago. Labs reviewed : TG 475; LDL 109.4. She has read [employer] Sugar [employer], she has started to reduce carbs.  Hypertension History:      She complains of headache, neurologic problems, and side effects from treatment, but denies chest pain, palpitations, dyspnea with exertion, orthopnea, PND, peripheral edema, visual symptoms, and syncope.  She notes the following problems with antihypertensive medication side effects: Benazepril caused hoarseness or cough.  Further comments include: BP  82/51- 176/94 .        Positive major cardiovascular risk factors include female age 36 years old or older, hyperlipidemia, and hypertension.  Negative major cardiovascular risk factors include non-tobacco-user status.       Current Allergies (reviewed today): ! SULFA ! PERCODAN ! LOTENSIN ! VIOXX ! * DICLOFENAC      Physical Exam  General:     well-nourished,in no acute distress; alert,appropriate and cooperative throughout examination Heart:     Normal rate and regular rhythm. S1 and S2 normal without gallop, murmur, click, rub or other extra sounds. Pulses:     R and L carotid,radial,dorsalis pedis and posterior tibial pulses are full and equal bilaterally Extremities:     No edema Psych:     slightly anxious. She D/Ced Verapamil because it had to be taken with food.    Impression & Recommendations:  Problem # 1:  LABILE HYPERTENSION (ICD-401.9)  Her updated medication list for this problem includes:    Clonidine Hcl 0.2 Mg Tabs (Clonidine hcl) .Marland Kitchen... 1 bid    Diovan 160 Mg Tabs (Valsartan) .Marland Kitchen... 1 once daily after benicar samples completed  Orders: Cardiology Referral (Cardiology)   Problem # 2:  HYPERTRIGLYCERIDEMIA (ICD-272.1)  Orders: Nutrition Referral (Nutrition)   Complete Medication List: 1)  Lorazepam 1 Mg Tabs (Lorazepam) .... 1/2 tab q8hrs prn 2)  Accolate 20 Mg Tabs (Zafirlukast) .... Once daily 3)  Oscal 500/200 D-3 Tabs (Calcium-vitamin d tabs) .... 2 tabs daily 4)  Zyrtec 10 Mg Tabs (Cetirizine hcl) .Marland Kitchen.. 1po qd 5)  Advair Diskus 250-50 Mcg/dose Misc (Fluticasone-salmeterol) .Marland Kitchen.. 1 puff bid 6)  Nasonex 50 Mcg/act Susp (Mometasone furoate) .... Use 1 to 2 sprays in each nostril daily 7)  Allopurinol 300 Mg Tabs (Allopurinol) .Marland Kitchen.. 1 by mouth qd 8)  Baby Aspirin 81 Mg Chew (Aspirin) 9)  Albuterol Mdi  10)  Vit D1000iuqd  .Marland Kitchen.. 1 by mouth qd 11)  Multivitamin  12)  Sertraline Hcl 50 Mg Tabs (Sertraline hcl) .Marland Kitchen.. 1& 1/2 qd 13)  Omeprazole 20 Mg Cpdr (Omeprazole) .Marland Kitchen.. 1 by mouth qd 14)  Mucinex  Dm  ..Marland Kitchen. 1 by mouth qd 15)  Tylenol As 2 Tabs Bid  16)  B-1  17)  Verapamil Hcl Cr 240 Mg Tbcr (verapamil Hcl)  .Marland Kitchen.. 1 qd 18)  Clonidine Hcl 0.2 Mg Tabs (Clonidine hcl) .Marland Kitchen.. 1 bid 19)  Diovan 160 Mg Tabs (Valsartan) .Marland Kitchen.. 1 once daily after benicar samples completed  Hypertension Assessment/Plan:      The patient's hypertensive risk group is category B: At least one risk factor (excluding diabetes) with no target organ damage.  Today's blood pressure is 132/80.     Patient Instructions: 1)  Stop Benicar; take Clonidine & Diovan . 2)  Check your Blood Pressure regularly. If it is above: 130/85 ON AVERAGE  over 72 hrs you should  resume the Verapamil.Tell  Pharmacist you are INTOLERANT to Benazepril  & all ACE-I drugs.   ]

## 2011-01-28 NOTE — Letter (Signed)
Summary: Results Follow up Letter  [facility] [place]  [address] [place], [place] [address]   Phone: [phone]  Fax: [phone]    05/07/2009 MRN: [id number]  [facility] [address] [place], [place]  [address]  Dear [name],  The following are the results of your recent test(s):  Test         Result    Pap Smear:        Normal _____  Not Normal _____ Comments: ______________________________________________________ Cholesterol: LDL(Bad cholesterol):         Your goal is less than:         HDL (Good cholesterol):       Your goal is more than: Comments:  ______________________________________________________ Mammogram:        Normal _____  Not Normal _____ Comments:  ___________________________________________________________________ Hemoccult:        Normal _____  Not normal _______ Comments:    _____________________________________________________________________ Other Tests: PLEASE SEE ATTAHCEDE LABS DONE ON 05/04/2009    We routinely do not discuss normal results over the telephone.  If you desire a copy of the results, or you have any questions about this information we can discuss them at your next office visit.   Sincerely,

## 2011-01-28 NOTE — Assessment & Plan Note (Signed)
Summary: CPX- jr   Vital Signs:  Patient profile:   70 year old female Height:      61 inches Weight:      154.6 pounds BMI:     29.32 Temp:     98.2 degrees F oral Resp:     16 per minute BP sitting:   128 / 80  (left arm) Cuff size:   large  Vitals Entered By: Shonna Chock (December 26, 2009 11:08 AM)  CC: CPX with fasting labs , General Medical Evaluation Comments REVIEWED MED LIST, PATIENT AGREED DOSE AND INSTRUCTION CORRECT    Primary Care Provider:  Laury Axon  CC:  CPX with fasting labs  and General Medical Evaluation.  History of Present Illness: Lisa Larson is here for a physical; she has intermittent nocturnal numbness LUE which awakens her.   Preventive Screening-Counseling & Management  Caffeine-Diet-Exercise     Does Patient Exercise: no  Allergies: 1)  ! Sulfa 2)  ! Percodan 3)  ! Lotensin 4)  ! Vioxx 5)  ! * Diclofenac  Past History:  Past Medical History: Asthma Hypertension Osteoporosis (BMD  11/27/2009 @ [facility]), S/P Fosamax X 5 years (?) GERD Depression Hyperlipidemia Anxiety Fractured shoulder 9/09,Dr Gean Birchwood   Past Surgical History: TAH+ BSO for dysfunctional bleeding T&A Colonoscopy-2001(due 2011) Sinus surgery-2001 Appendectomy Esophageal dilation X1, Dr Juanda Chance  Family History: Father: arthritis,RA,PTE, HTN Mother: HTN, Altzheimer's Siblings: bro HTN, DM; bro  NHL,esophageal stricture,HTN daughter IgA deficiency; son laryngeal cancer (sarcoma)  Social History: Former Smoker: quit 1970 Alcohol use-yes : 2-4 glasses/ day No diet Occupation:[employer] Employee Regular exercise-no  Review of Systems       Weight down with diet change. Fatty benign tissue on mammograms. Eyes:  Complains of red eye; denies discharge, eye pain, and vision loss-both eyes. ENT:  ST last week w/o purulence, frontal headache, or facial pain. CV:  Denies chest pain or discomfort, difficulty breathing at night, difficulty breathing while lying down,  leg cramps with exertion, palpitations, swelling of feet, and swelling of hands. Resp:  Denies cough, shortness of breath, sputum productive, and wheezing; No rescue MDI needed.  Physical Exam  General:  well-nourished,in no acute distress; alert,appropriate and cooperative throughout examination Head:  Normocephalic and atraumatic without obvious abnormalities.  Eyes:  No corneal or conjunctival inflammation noted. Perrla. Funduscopic exam benign, without hemorrhages, exudates or papilledema.  Ears:  External ear exam shows no significant lesions or deformities.  Otoscopic examination reveals clear canals, tympanic membranes are intact bilaterally without bulging, retraction, inflammation or discharge. Hearing is grossly normal bilaterally. Nose:  External nasal examination shows no deformity or inflammation. Nasal mucosa are pink and moist without lesions or exudates. Mouth:  Oral mucosa and oropharynx without lesions or exudates.  Teeth in good repair. Some pain R TMJ  Neck:  No deformities, masses, or tenderness noted. Lungs:  Normal respiratory effort, chest expands symmetrically. Lungs are clear to auscultation, no crackles or wheezes. Heart:  Normal rate and regular rhythm. S1 and S2 normal without gallop, murmur, click, rub . S4 Abdomen:  Bowel sounds positive,abdomen soft and non-tender without masses, organomegaly or hernias noted. Genitalia:  TAH & BSO Msk:  No deformity or scoliosis noted of thoracic or lumbar spine.   Pulses:  R and L carotid,radial,dorsalis pedis and posterior tibial pulses are full and equal bilaterally Extremities:  No clubbing, cyanosis, edema. OA hand changes. Decreased ROM R knee Neurologic:  alert & oriented X3 and DTRs symmetrical and normal.   Skin:  Intact without suspicious lesions or rashes Cervical Nodes:  No lymphadenopathy noted Axillary Nodes:  No palpable lymphadenopathy Psych:  memory intact for recent and remote, normally interactive, good eye  contact, not anxious appearing, and not depressed appearing.     Impression & Recommendations:  Problem # 1:  PREVENTIVE HEALTH CARE (ICD-V70.0)  Orders: EKG w/ Interpretation (93000) Venipuncture (96045) TLB-Lipid Panel (80061-LIPID) TLB-BMP (Basic Metabolic Panel-BMET) (80048-METABOL) TLB-CBC Platelet - w/Differential (85025-CBCD) TLB-Hepatic/Liver Function Pnl (80076-HEPATIC) TLB-TSH (Thyroid Stimulating Hormone) (84443-TSH) T-Vitamin D (25-Hydroxy) (40981-19147) TLB-A1C / Hgb A1C (Glycohemoglobin) (83036-A1C) TLB-Uric Acid, Blood (84550-URIC)  Problem # 2:  TMJ PAIN (ICD-524.62)  Problem # 3:  HYPERLIPIDEMIA (ICD-272.4)  Orders: EKG w/ Interpretation (93000) Venipuncture (82956) TLB-Lipid Panel (80061-LIPID)  Problem # 4:  HYPERGLYCEMIA, FASTING (ICD-790.29)  Orders: Venipuncture (21308) TLB-A1C / Hgb A1C (Glycohemoglobin) (83036-A1C)  Problem # 5:  HYPERTRIGLYCERIDEMIA (ICD-272.1)  Problem # 6:  ASTHMA (ICD-493.90) stable  Her updated medication list for this problem includes:    Accolate 20 Mg Tabs (Zafirlukast) ..... Once daily    Advair Diskus 250-50 Mcg/dose Misc (Fluticasone-salmeterol) .Marland Kitchen... 1 puff bid  Problem # 7:  OSTEOPOROSIS (ICD-733.00)  Orders: Venipuncture (65784) T-Vitamin D (25-Hydroxy) (69629-52841)  Problem # 8:  GERD (ICD-530.81)  Her updated medication list for this problem includes:    Omeprazole 20 Mg Cpdr (Omeprazole) .Marland Kitchen... 1 by mouth qd  Problem # 9:  GOUT, UNSPECIFIED (ICD-274.9)  Her updated medication list for this problem includes:    Allopurinol 300 Mg Tabs (Allopurinol) .Marland Kitchen... 1 by mouth qd  Orders: TLB-Uric Acid, Blood (84550-URIC)  Complete Medication List: 1)  Lorazepam 1 Mg Tabs (Lorazepam) .... 1/2 tab q8hrs prn 2)  Accolate 20 Mg Tabs (Zafirlukast) .... Once daily 3)  Oscal 500/200 D-3 Tabs (Calcium-vitamin d tabs) .... 2 tabs daily 4)  Zyrtec 10 Mg Tabs (Cetirizine hcl) .Marland Kitchen.. 1po qd 5)  Advair Diskus 250-50  Mcg/dose Misc (Fluticasone-salmeterol) .Marland Kitchen.. 1 puff bid 6)  Nasonex 50 Mcg/act Susp (Mometasone furoate) .... Use 1 to 2 sprays in each nostril daily 7)  Allopurinol 300 Mg Tabs (Allopurinol) .Marland Kitchen.. 1 by mouth qd 8)  Baby Aspirin 81 Mg Chew (Aspirin) 9)  Albuterol Mdi  10)  Vit D1000iuqd  .Marland Kitchen.. 1 by mouth qd 11)  Multivitamin  12)  Sertraline Hcl 100 Mg Tabs (Sertraline hcl) .Marland Kitchen.. 1 by mouth daily 13)  Omeprazole 20 Mg Cpdr (Omeprazole) .Marland Kitchen.. 1 by mouth qd 14)  Mucinex Dm  .Marland KitchenMarland Kitchen. 1 by mouth qd 15)  Tylenol As 2 Tabs Bid  16)  B-1  17)  Diovan 160 Mg Tabs (Valsartan) .Marland Kitchen.. 1 once daily after benicar samples completed 18)  Amlodipine Besylate 10 Mg Tabs (Amlodipine besylate) .Marland Kitchen.. 1 by mouth once daily  Patient Instructions: 1)  Check your Blood Pressure regularly. If it is above:135/85 ON AVERAGE  you should make an appointment. Soft diet & warm compresses for TMJ symptoms. Zicam Melts as needed for sore throat. Prescriptions: NASONEX 50 MCG/ACT  SUSP (MOMETASONE FUROATE) Use 1 to 2 sprays in each nostril daily  #17 Gram x 11   Entered and Authorized by:   Marga Melnick MD   Signed by:   Marga Melnick MD on 12/26/2009   Method used:   Print then Give to Patient   RxID:   [id number] LORAZEPAM 1 MG TABS (LORAZEPAM) 1/2 tab q8hrs prn  #60 x 5   Entered and Authorized by:   Marga Melnick MD   Signed by:   Marga Melnick MD  on 12/26/2009   Method used:   Print then Give to Patient   RxID:   [id number] ACCOLATE 20 MG TABS (ZAFIRLUKAST) once daily  #90 Tablet x 3   Entered and Authorized by:   Marga Melnick MD   Signed by:   Marga Melnick MD on 12/26/2009   Method used:   Print then Give to Patient   RxID:   [id number] DIOVAN 160 MG  TABS (VALSARTAN) 1 once daily AFTER Benicar samples completed  #90 x 3   Entered and Authorized by:   Marga Melnick MD   Signed by:   Marga Melnick MD on 12/26/2009   Method used:   Print then Give to Patient   RxID:   [id number]  OMEPRAZOLE 20 MG CPDR (OMEPRAZOLE) 1 by mouth qd  #990 x 1   Entered and Authorized by:   Marga Melnick MD   Signed by:   Marga Melnick MD on 12/26/2009   Method used:   Print then Give to Patient   RxID:   [id number] SERTRALINE HCL 100 MG TABS (SERTRALINE HCL) 1 by mouth daily  #90 Tablet x 3   Entered and Authorized by:   Marga Melnick MD   Signed by:   Marga Melnick MD on 12/26/2009   Method used:   Print then Give to Patient   RxID:   [id number] ALLOPURINOL 300 MG TABS (ALLOPURINOL) 1 by mouth qd  #90 x 3   Entered and Authorized by:   Marga Melnick MD   Signed by:   Marga Melnick MD on 12/26/2009   Method used:   Print then Give to Patient   RxID:   [id number]

## 2011-01-28 NOTE — Assessment & Plan Note (Signed)
Summary: ? pink eye right//fd   Vital Signs:  Patient profile:   70 year old female Height:      61 inches Weight:      155 pounds Temp:     98.1 degrees F oral Pulse rate:   68 / minute Resp:     17 per minute BP sitting:   120 / 68  (left arm)  Vitals Entered By: Jeremy Johann CMA (January 01, 2010 11:32 AM) CC: right eye pink,with some crusting at night x2week, red rash under breast Comments REVIEWED MED LIST, PATIENT AGREED DOSE AND INSTRUCTION CORRECT    Primary Care Provider:  Laury Axon  CC:  right eye pink, with some crusting at night x2week, and red rash under breast.  History of Present Illness: Pain OD X 2 weeks; no treatment. Pain awakens her; sunlight aggravates pain. BMD results reviewed; Osteopenia @ femoral neck.  Allergies: 1)  ! Sulfa 2)  ! Percodan 3)  ! Lotensin 4)  ! Vioxx 5)  ! * Diclofenac  Past History:  Past Medical History: Asthma Hypertension Osteopenia (BMD  T score -1.6 @ L femoral neck 11/27/2009), S/P Fosamax X 5 years (?) GERD Depression Hyperlipidemia Anxiety Fractured shoulder 9/09,Dr Gean Birchwood   Review of Systems Eyes:  Complains of eye irritation, eye pain, and red eye; denies blurring, discharge, and vision loss-both eyes. Derm:  Complains of changes in color of skin and rash; Recurrent inframammary rash.  Physical Exam  General:  well-nourished,in no acute distress; alert,appropriate and cooperative throughout examination Eyes:  No  conjunctival inflammation noted; mild scleritis. EOMI. Perrla. Vision grossly normal. Skin:  Minimal erythema OD lids Cervical Nodes:  No lymphadenopathy noted Axillary Nodes:  No palpable lymphadenopathy   Impression & Recommendations:  Problem # 1:  SCLERITIS (ICD-379.00)  Problem # 2:  OSTEOPENIA (ICD-733.90)  Complete Medication List: 1)  Lorazepam 1 Mg Tabs (Lorazepam) .... 1/2 tab q8hrs prn 2)  Accolate 20 Mg Tabs (Zafirlukast) .... Once daily 3)  Oscal 500/200 D-3 Tabs  (Calcium-vitamin d tabs) .... 2 tabs daily 4)  Zyrtec 10 Mg Tabs (Cetirizine hcl) .Marland Kitchen.. 1po qd 5)  Advair Diskus 250-50 Mcg/dose Misc (Fluticasone-salmeterol) .Marland Kitchen.. 1 puff bid 6)  Nasonex 50 Mcg/act Susp (Mometasone furoate) .... Use 1 to 2 sprays in each nostril daily 7)  Allopurinol 300 Mg Tabs (Allopurinol) .Marland Kitchen.. 1 by mouth qd 8)  Baby Aspirin 81 Mg Chew (Aspirin) 9)  Albuterol Mdi  10)  Vit D1000iuqd  .Marland Kitchen.. 1 by mouth qd 11)  Multivitamin  12)  Sertraline Hcl 100 Mg Tabs (Sertraline hcl) .Marland Kitchen.. 1 by mouth daily 13)  Omeprazole 20 Mg Cpdr (Omeprazole) .Marland Kitchen.. 1 by mouth qd 14)  Mucinex Dm  .Marland KitchenMarland Kitchen. 1 by mouth qd 15)  Tylenol As 2 Tabs Bid  16)  B-1  17)  Diovan 160 Mg Tabs (Valsartan) .Marland Kitchen.. 1 once daily after benicar samples completed 18)  Amlodipine Besylate 10 Mg Tabs (Amlodipine besylate) .Marland Kitchen.. 1 by mouth once daily 19)  Amoxicillin-pot Clavulanate 500-125 Mg Tabs (Amoxicillin-pot clavulanate) .Marland Kitchen.. 1 two times a day with a meal  Patient Instructions: 1)  Nizoral & Erythromycin ointment as Rxed. BMD in 25 months. Prescriptions: AMOXICILLIN-POT CLAVULANATE 500-125 MG TABS (AMOXICILLIN-POT CLAVULANATE) 1 two times a day with a meal  #20 x 0   Entered and Authorized by:   Marga Melnick MD   Signed by:   Marga Melnick MD on 01/01/2010   Method used:   Faxed to .Marland KitchenMarland Kitchen  [facility] [address] (retail)       [address]       [place], [place]  [address]       Ph: [phone]       Fax: [phone]   RxID:   [id number]

## 2011-01-28 NOTE — Progress Notes (Signed)
Summary: wants to increase Zoloft//hop  Phone Note Call from Patient Call back at Home Phone [phone]   Caller: Patient Call For: zoloft Summary of Call: pt called and left msg in ref to Zoloft taking 1 1/2, says Dr Alwyn Ren told her could take 2, calling to ask is it ok take 2.  Spoke with pt husband pt not at home, informed husband Dr Alwyn Ren out of office this pm will have to address in am Initial call taken by: Kandice Hams,  January 17, 2009 3:11 PM  Follow-up for Phone Call        up to 100 mg once daily OK; if no better OV needed Follow-up by: Marga Melnick MD,  January 17, 2009 3:18 PM  Additional Follow-up for Phone Call Additional follow up Details #1::        Spoke with pt informed per Dr Alwyn Ren, ok pt says she feels so good on the 1 1/2 wanted to go to 100mg . [facility]  Additional Follow-up by: Kandice Hams,  January 18, 2009 9:11 AM    New/Updated Medications: SERTRALINE HCL 100 MG TABS (SERTRALINE HCL) 1 by mouth daily   Prescriptions: SERTRALINE HCL 100 MG TABS (SERTRALINE HCL) 1 by mouth daily  #30 x 3   Entered by:   Kandice Hams   Authorized by:   Marga Melnick MD   Signed by:   Kandice Hams on 01/18/2009   Method used:   Faxed to ...       [facility] [address] [phone][facility] (retail)       [address]       [place], [place]  [address]       Ph: [phone] or [phone]       Fax: [phone]   RxID:   [id number]

## 2011-01-28 NOTE — Progress Notes (Signed)
Summary: **Culture Results**  Phone Note Outgoing Call Call back at [facility] Phone [phone]   Call placed by: Shonna Chock,  May 16, 2009 12:13 PM Call placed to: Patient Summary of Call: Left message on machine for patient to return call when avaliable, Reason for call:   Stool cultures negative for these infectious organisms. Excellent ; no evidence of gastrintestinal parasites  Chrae Malloy  May 16, 2009 12:14 PM     Follow-up for Phone Call        Spoke with, patient ok'd information./Chrae Malloy  May 16, 2009 12:18 PM

## 2011-01-28 NOTE — Progress Notes (Signed)
Summary: Refill Request  Phone Note Refill Request Call back at [phone] Message from:  Pharmacy on June 04, 2010 12:38 PM  Refills Requested: Medication #1:  ACCOLATE 20 MG TABS once daily   Dosage confirmed as above?Dosage Confirmed   Brand Name Necessary? No   Supply Requested: 3 months [facility] [address]  Next Appointment Scheduled: none Initial call taken by: Harold Barban,  June 04, 2010 12:38 PM    Prescriptions: ACCOLATE 20 MG TABS (ZAFIRLUKAST) once daily  #90 Tablet x 2   Entered by:   Shonna Chock   Authorized by:   Marga Melnick MD   Signed by:   Shonna Chock on 06/04/2010   Method used:   Electronically to        [facility] (retail)       [address]       [place], [place]  [address]       Ph: [phone]       Fax: [phone]   RxID:   [id number]

## 2011-01-28 NOTE — Progress Notes (Signed)
Summary: KEEP TRYING TO REACH PATIENT 06/07/08  Phone Note Call from Patient Call back at Home Phone [phone] Call back at Work Phone [phone]   Caller: Patient Summary of Call: call work # after 10:00 - bp med was changed from clonidine hcl .2mg  1 twice a day to verapamil sa 120mg  myl 1 tab a day she started taking 060909 her bp was 182/102 at 8:30 pm --- today [id number] 178/124 - 212/108 - insert states may take 1 week to take effect but she wants to know if she should increase because reading so high Initial call taken by: Okey Regal Spring,  June 07, 2008 8:49 AM  Follow-up for Phone Call        DR.HOPPER PLEASE ADVISE./Chrae Malloy  June 07, 2008 8:56 AM   Additional Follow-up for Phone Call Additional follow up Details #1::        Per Dr.Hopper have patient take Verapamil 1 by mouth every 12 hours.  Called patient @ home number x 2 ([employer]), Called work number-unable to reach patient. Will try again later. Shonna Chock  June 07, 2008 9:40 AM     Additional Follow-up for Phone Call Additional follow up Details #2::    Pt called back informed pt per Dr Alwyn Ren to take Verapamil every 12 hours.Kandice Hams  June 07, 2008 2:17 PM  Follow-up by: Kandice Hams,  June 07, 2008 2:17 PM

## 2011-01-28 NOTE — Progress Notes (Signed)
Summary: gout med/hop  Phone Note Call from Patient   Caller: Patient Call For: gout med Summary of Call: Pt called for med for gout in her right toe, pt says she has had before last refill allopurinol #30 6 refill 05/19/08 last ov 10/26/08 Initial call taken by: Kandice Hams,  October 27, 2008 3:39 PM  Follow-up for Phone Call        left msg for pt rx faxed [facility] .Kandice Hams  October 30, 2008 9:19 AM  Follow-up by: Kandice Hams,  October 30, 2008 9:19 AM      Prescriptions: ALLOPURINOL 300 MG TABS (ALLOPURINOL) 1 by mouth qd  #30 x 6   Entered by:   Kandice Hams   Authorized by:   Marga Melnick MD   Signed by:   Kandice Hams on 10/30/2008   Method used:   [facility]ed to ...       [facility] [phone][facility] (retail)       [address]       [place], [place]  [address]       Ph: [phone] or [phone]       Fax: [phone]   RxID:   [id number]

## 2011-01-28 NOTE — Progress Notes (Signed)
Summary: HOPPER-REFILL ALLOPURINOL 300MG    Phone Note Refill Request   Refills Requested: Medication #1:  ALLOPURINOL 300 MG TABS 1 by mouth qd RECD BY FAX [id number] [address] [phone] LAST FILLED #30 ON 11.09.08  Initial call taken by: Gwen Pounds,  November 23, 2007 8:52 AM  Follow-up for Phone Call        Rx completed in Dr. Tiajuana Amass Follow-up by: Wendall Stade,  November 23, 2007 9:50 AM      Prescriptions: ALLOPURINOL 300 MG TABS (ALLOPURINOL) 1 by mouth qd  #30 x 6   Entered by:   Wendall Stade   Authorized by:   Marga Melnick MD   Signed by:   Wendall Stade on 11/23/2007   Method used:   Electronically [facility] to ...       [facility] [id number]       [address]       [place], [place]  [address]       Ph: [phone] or [phone]       Fax: [phone]   RxID:   [id number] ALLOPURINOL 300 MG TABS (ALLOPURINOL) 1 by mouth qd  #30 x 6   Entered by:   Wendall Stade   Authorized by:   Marga Melnick MD   Signed by:   Wendall Stade on 11/23/2007   Method used:   Print then Give to Patient   RxID:   [id number]

## 2011-01-28 NOTE — Miscellaneous (Signed)
  Clinical Lists Changes  Orders: Added new Test order of T-2 View CXR (71020TC) - Signed 

## 2011-01-28 NOTE — Progress Notes (Signed)
Summary: lorazepam refill   Phone Note Refill Request Message from:  Fax from Pharmacy on July 17, 2010 3:55 PM  Refills Requested: Medication #1:  LORAZEPAM 1 MG TABS 1/2 tab q8hrs prn Trisha Mangle [id number] - TE [id number]  Initial call taken by: Okey Regal Spring,  July 17, 2010 3:56 PM  Follow-up for Phone Call        Please advise. Lucious Groves CMA  July 17, 2010 4:32 PM   Additional Follow-up for Phone Call Additional follow up Details #1::        OK for refill & RX 5 Additional Follow-up by: Marga Melnick MD,  July 17, 2010 4:34 PM    Prescriptions: LORAZEPAM 1 MG TABS (LORAZEPAM) 1/2 tab q8hrs prn  #60 x 5   Entered by:   Doristine Devoid CMA   Authorized by:   Marga Melnick MD   Signed by:   Doristine Devoid CMA on 07/17/2010   Method used:   Telephoned to ...       [facility] (retail)       [address]       [place], [place]  [address]       Ph: [phone]       Fax: [phone]   RxID:   [id number]

## 2011-01-28 NOTE — Medication Information (Signed)
Summary: Possible Noncompliance with Advair/[employer]  Possible Noncompliance with Advair/[employer]   Imported By: Lanelle Bal 06/27/2009 11:44:03  _____________________________________________________________________  External Attachment:    Type:   Image     Comment:   External Document

## 2011-01-28 NOTE — Assessment & Plan Note (Signed)
Summary: asthma/cbs   Vital Signs:  Patient profile:   70 year old female Height:      61 inches Weight:      154.8 pounds BMI:     29.35 O2 Sat:      89 % on Room air Temp:     98.5 degrees F oral Pulse rate:   80 / minute Resp:     18 per minute BP sitting:   112 / 74  (left arm) Cuff size:   large  Vitals Entered By: Shonna Chock CMA (November 06, 2010 10:14 AM)  O2 Flow:  Room air CC: Asthma and dry cough x 1 week , COPD follow-up   Primary Care Provider:  Laury Axon  CC:  Asthma and dry cough x 1 week  and COPD follow-up.  History of Present Illness: Asthma/COPD flare  X 7-10 days ; initial trigger was exposure to husband's RTI.  The patient reports shortness of breath, wheezing, cough, and exercise induced symptoms, but denies increased sputum, heat intolerance, and nocturnal awakening.  The patient reports limitation of moderate activities.  Current treatment includes MDI without spacer, albuterol 2X/ day.  Medication use includes controller med daily, Advair 2X/day.  Symptoms are worse with fumes or chemicals, cinnamon odor @ work. Peak flow 200 L/min; her normal is ?Marland Kitchen    Current Medications (verified): 1)  Lorazepam 1 Mg Tabs (Lorazepam) .... 1/2 Tab Q8hrs Prn 2)  Accolate 20 Mg Tabs (Zafirlukast) .... Once Daily 3)  Oscal 500/200 D-3  Tabs (Calcium-Vitamin D Tabs) .... 2 Tabs Daily 4)  Zyrtec 10 Mg Tabs (Cetirizine Hcl) .Marland Kitchen.. 1po Qd 5)  Advair Diskus 250-50 Mcg/dose Misc (Fluticasone-Salmeterol) .Marland Kitchen.. 1 Puff Bid 6)  Nasonex 50 Mcg/act  Susp (Mometasone Furoate) .... Use 1 To 2 Sprays in Each Nostril Daily 7)  Allopurinol 300 Mg Tabs (Allopurinol) .Marland Kitchen.. 1 By Mouth Qd 8)  Baby Aspirin 81 Mg Chew (Aspirin) 9)  Albuterol Mdi 10)  Vit D1000iuqd .Marland Kitchen.. 1 By Mouth Qd 11)  Multivitamin 12)  Sertraline Hcl 100 Mg Tabs (Sertraline Hcl) .Marland Kitchen.. 1 By Mouth Daily 13)  Omeprazole 20 Mg Cpdr (Omeprazole) .Marland Kitchen.. 1 By Mouth Qd 14)  Mucinex Dm .Marland Kitchen.. 1 By Mouth Qd 15)  Tylenol As 2 Tabs Bid 16)   B-1 17)  Diovan 160 Mg  Tabs (Valsartan) .Marland Kitchen.. 1 Once Daily After Benicar Samples Completed 18)  Amlodipine Besylate 10 Mg Tabs (Amlodipine Besylate) .Marland Kitchen.. 1 By Mouth Once Daily  Allergies: 1)  ! Sulfa 2)  ! Percodan 3)  ! Lotensin 4)  ! Vioxx 5)  ! * Diclofenac  Review of Systems General:  Complains of chills; denies fever. ENT:  Denies earache, nasal congestion, sinus pressure, and sore throat; No frontal headache, facial pain or purulence. Resp:  Denies chest pain with inspiration and coughing up blood.  Physical Exam  General:  well-nourished,in no acute distress; alert,appropriate and cooperative throughout examination Eyes:  No corneal or conjunctival inflammation noted.  Ears:  External ear exam shows no significant lesions or deformities.  Otoscopic examination reveals clear canals, tympanic membranes are intact bilaterally without bulging, retraction, inflammation or discharge. Hearing is grossly normal bilaterally. Nose:  External nasal examination shows no deformity or inflammation. Nasal mucosa are pink and moist without lesions or exudates. Mouth:  Oral mucosa and oropharynx without lesions or exudates.  Teeth in good repair. Lungs:  Normal respiratory effort, chest expands symmetrically. Lungs : scattered  dry crackles  w/o increased WOB Heart:  normal  rate, regular rhythm, no murmur, no gallop, no rub, no JVD, and no HJR.   Extremities:  No clubbing, cyanosis, edema. OA hand changes Skin:  Intact without suspicious lesions or rashes Cervical Nodes:  No lymphadenopathy noted Axillary Nodes:  No palpable lymphadenopathy   Impression & Recommendations:  Problem # 1:  BRONCHITIS-ACUTE (ICD-466.0)  The following medications were removed from the medication list:    Amoxicillin-pot Clavulanate 500-125 Mg Tabs (Amoxicillin-pot clavulanate) .Marland Kitchen... 1 two times a day with a meal Her updated medication list for this problem includes:    Accolate 20 Mg Tabs (Zafirlukast) .....  Once daily    Advair Diskus 250-50 Mcg/dose Misc (Fluticasone-salmeterol) .Marland Kitchen... 1 puff bid    Azithromycin 250 Mg Tabs (Azithromycin) .Marland Kitchen... As per pack    Dulera 200-5 Mcg/act Aero (Mometasone furo-formoterol fum) .Marland Kitchen... 1-2 puffs every 12 hrs ; gargle & spit after use    Singulair 10 Mg Tabs (Montelukast sodium) .Marland Kitchen... 1 once daily  Orders: T-2 View CXR (71020TC)  Problem # 2:  ASTHMA NOS W/ACUTE EXACERBATION (ICD-493.92)  Her updated medication list for this problem includes:    Accolate 20 Mg Tabs (Zafirlukast) ..... Once daily    Advair Diskus 250-50 Mcg/dose Misc (Fluticasone-salmeterol) .Marland Kitchen... 1 puff bid    Prednisone 20 Mg Tabs (Prednisone) .Marland Kitchen... 1 two times a day with a meal    Dulera 200-5 Mcg/act Aero (Mometasone furo-formoterol fum) .Marland Kitchen... 1-2 puffs every 12 hrs ; gargle & spit after use    Singulair 10 Mg Tabs (Montelukast sodium) .Marland Kitchen... 1 once daily  Orders: T-2 View CXR (71020TC)  Complete Medication List: 1)  Lorazepam 1 Mg Tabs (Lorazepam) .... 1/2 tab q8hrs prn 2)  Accolate 20 Mg Tabs (Zafirlukast) .... Once daily 3)  Oscal 500/200 D-3 Tabs (Calcium-vitamin d tabs) .... 2 tabs daily 4)  Zyrtec 10 Mg Tabs (Cetirizine hcl) .Marland Kitchen.. 1po qd 5)  Advair Diskus 250-50 Mcg/dose Misc (Fluticasone-salmeterol) .Marland Kitchen.. 1 puff bid 6)  Nasonex 50 Mcg/act Susp (Mometasone furoate) .... Use 1 to 2 sprays in each nostril daily 7)  Allopurinol 300 Mg Tabs (Allopurinol) .Marland Kitchen.. 1 by mouth qd 8)  Baby Aspirin 81 Mg Chew (Aspirin) 9)  Albuterol Mdi  10)  Vit D1000iuqd  .Marland Kitchen.. 1 by mouth qd 11)  Multivitamin  12)  Sertraline Hcl 100 Mg Tabs (Sertraline hcl) .Marland Kitchen.. 1 by mouth daily 13)  Omeprazole 20 Mg Cpdr (Omeprazole) .Marland Kitchen.. 1 by mouth qd 14)  Mucinex Dm  .Marland KitchenMarland Kitchen. 1 by mouth qd 15)  Tylenol As 2 Tabs Bid  16)  B-1  17)  Diovan 160 Mg Tabs (Valsartan) .Marland Kitchen.. 1 once daily after benicar samples completed 18)  Amlodipine Besylate 10 Mg Tabs (Amlodipine besylate) .Marland Kitchen.. 1 by mouth once daily 19)  Prednisone 20 Mg  Tabs (Prednisone) .Marland Kitchen.. 1 two times a day with a meal 20)  Azithromycin 250 Mg Tabs (Azithromycin) .... As per pack 21)  Dulera 200-5 Mcg/act Aero (Mometasone furo-formoterol fum) .Marland Kitchen.. 1-2 puffs every 12 hrs ; gargle & spit after use 22)  Singulair 10 Mg Tabs (Montelukast sodium) .Marland Kitchen.. 1 once daily  Patient Instructions: 1)  Stop Advair & use Dulera. 2)  Drink as much fluid as you can tolerate for the next few days. Prescriptions: SINGULAIR 10 MG TABS (MONTELUKAST SODIUM) 1 once daily  #30 x 11   Entered and Authorized by:   Marga Melnick MD   Signed by:   Marga Melnick MD on 11/06/2010   Method used:   Print then  Give to Patient   RxID:   [id number] DULERA 200-5 MCG/ACT AERO (MOMETASONE FURO-FORMOTEROL FUM) 1-2 puffs every 12 hrs ; gargle & spit after use  #1 x 11   Entered and Authorized by:   Marga Melnick MD   Signed by:   Marga Melnick MD on 11/06/2010   Method used:   Print then Give to Patient   RxID:   [id number] AZITHROMYCIN 250 MG TABS (AZITHROMYCIN) as per pack  #1 x 0   Entered and Authorized by:   Marga Melnick MD   Signed by:   Marga Melnick MD on 11/06/2010   Method used:   Faxed to ...       [facility] [phone][facility] (retail)       [address]       [place], [place]  [address]       Ph: [phone]       Fax: [phone]   RxID:   [id number] PREDNISONE 20 MG TABS (PREDNISONE) 1 two times a day with a meal  #14 x 0   Entered and Authorized by:   Marga Melnick MD   Signed by:   Marga Melnick MD on 11/06/2010   Method used:   Faxed to ...       [facility] [phone]* (retail)       [address]       [place], [place]  [address]       Ph: [phone]       Fax: [phone]   RxID:   [id number]    Orders Added: 1)  Est. Patient Level IV [15176] 2)  T-2 View CXR [71020TC]

## 2011-01-28 NOTE — Progress Notes (Signed)
Summary: XRay Results  Phone Note Call from Patient Call back at Home Phone [phone]   Caller: Patient Details for Reason: Chest Xray Results Summary of Call: Spoke with patient  Please pick up samples of Avelox 400 mg once daily # 5  to optimally treat the pneumonia.Hold the  Zithromax. Repeat CXray in 5 days (Mon 11/14)  Patient will stop by tomorrow in the morning to pick up samples, per Dr.Zelpha Messing patient to take Zithromax tonight then hold, patient ok'd. Copy of Xray and order to recheck Xray placed in bag with samples  **Dr.Leron Stoffers patient is schedule to work Friday, patient would like to know if its ok for her to go to work with a DX of pneumonia  Shonna Chock CMA  November 06, 2010 4:33 PM

## 2011-01-28 NOTE — Progress Notes (Signed)
Summary: HOPPER--INHALER  Phone Note Call from Patient Call back at Home Phone [phone] Call back at Work Phone [phone]   Reason for Call: Refill Medication Summary of Call: PT NEEDS A RESCUE DRUG FOR HER ASTHMA TO CARRY AROUND WITH HER[facility] [phone] Initial call taken by: Freddy Jaksch,  May 17, 2008 3:24 PM  Follow-up for Phone Call        Spokew with pt who said seems like she needs an MDI, rescueinhaler due to asthma ov scheduled to discuss.Kandice Hams  May 17, 2008 4:14 PM   Follow-up by: Kandice Hams,  May 17, 2008 4:14 PM

## 2011-01-28 NOTE — Assessment & Plan Note (Signed)
Summary: acute/asthma discuss MDI rx/alr   Vital Signs:  Patient Profile:   70 Years Old Female Weight:      162.25 pounds Temp:     98.6 degrees F oral Pulse rate:   68 / minute Resp:     14 per minute BP sitting:   130 / 74  (left arm)  Pt. in pain?   no  Vitals Entered By: Ardyth Man (May 19, 2008 10:38 AM)                  PCP:  Laury Axon  Chief Complaint:  discuss asthma.  History of Present Illness: Last week [employer] called her & inquired as to rescue MDI use." Nurse said I need a MDI ; also Oral Surgeon said Clonidine might cause dry mouth & cause teeth to fall out"  Hypertension History:      She complains of headache, neurologic problems, and side effects from treatment, but denies chest pain, palpitations, dyspnea with exertion, orthopnea, PND, peripheral edema, visual symptoms, and syncope.  She notes the following problems with antihypertensive medication side effects: see comments re Clonidine.  Further comments include: BP 110/66-145/85.        Positive major cardiovascular risk factors include female age 63 years old or older and hypertension.  Negative major cardiovascular risk factors include non-tobacco-user status.       Current Allergies: ! SULFA ! PERCODAN ! LOTENSIN ! VIOXX ! * DICLOFENAC     Review of Systems  Eyes      Denies blurring, double vision, and vision loss-both eyes.  ENT      Gargles with Lavoris at bedtime which controls dry mouth  CV      Denies bluish discoloration of lips or nails, difficulty breathing at night, difficulty breathing while lying down, leg cramps with exertion, and swelling of feet.      NO PNDyspnea  Resp      Denies cough, shortness of breath, sputum productive, and wheezing.      No rescue MDI use for use on Asmanex & Nasonex  MS      Complains of joint pain.      Denies joint redness and joint swelling.      Rheu Dxed CTS. PMH gout  Neuro      Complains of headaches, numbness, and tingling.    Denies weakness.      [place] N&T of hands. Occipital bilat ,dull ,last min ,w/o radiation; Tylenol helps   Physical Exam  General:     Well-developed,well-nourished,in no acute distress; alert,appropriate and cooperative throughout examination Eyes:     No corneal or conjunctival inflammation noted. EOMI. Perrla. Funduscopic exam benign, without hemorrhages, exudates or papilledema. Art narrowing Ears:     External ear exam shows no significant lesions or deformities.  Otoscopic examination reveals clear canals, tympanic membranes are intact bilaterally without bulging, retraction, inflammation or discharge. Hearing is grossly normal bilaterally. Nose:     External nasal examination shows no deformity or inflammation. Nasal mucosa are pink and moist without lesions or exudates. Mouth:     Oral mucosa and oropharynx without lesions or exudates.  Teeth in good repair. Normal hydration Lungs:     Normal respiratory effort, chest expands symmetrically. Lungs are clear to auscultation, no crackles or wheezes. Heart:     Normal rate and regular rhythm. S1 and S2 normal without gallop, murmur, click, rub or other extra sounds. Pulses:     R and L carotid,radial,dorsalis pedis and posterior  tibial pulses are full and equal bilaterally Extremities:     No clubbing, cyanosis, edema. Severe  deformities of fingers  noted . Neurologic:     alert & oriented X3, cranial nerves II-XII intact, strength normal in all extremities, sensation intact to light touch, and DTRs symmetrical and normal.   Skin:     Intact without suspicious lesions or rashes Cervical Nodes:     No lymphadenopathy noted Axillary Nodes:     No palpable lymphadenopathy Psych:     memory intact for recent and remote and slightly anxious.      Impression & Recommendations:  Problem # 1:  UNS ADVRS EFF UNS RX MEDICINAL&BIOLOGICAL SBSTNC (ICD-995.20) dry mouth with Clonidine  Problem # 2:  ASTHMA (ICD-493.90) Quiescent  Her updated medication list for this problem includes:    Accolate 20 Mg Tabs (Zafirlukast) ..... Once daily    Advair Diskus 250-50 Mcg/dose Misc (Fluticasone-salmeterol) .Marland Kitchen... 1 puff bid   Problem # 3:  HYPERTENSION (ICD-401.9)  The following medications were removed from the medication list:    Clonidine Hcl 0.2 Mg Tabs (Clonidine hcl) .Marland Kitchen... 1 two times a day to maintain bp < 130/85  Her updated medication list for this problem includes:    Verapamil Hcl Cr 120 Mg Tbcr (Verapamil hcl) .Marland Kitchen... 1 qd  Orders: TLB-Creatinine, Blood (82565-CREA) TLB-Potassium (K+) (84132-K) TLB-BUN (Urea Nitrogen) (84520-BUN)   Complete Medication List: 1)  Lorazepam 1 Mg Tabs (Lorazepam) .... 1/2 tab q8hrs prn 2)  Accolate 20 Mg Tabs (Zafirlukast) .... Once daily 3)  Oscal 500/200 D-3 Tabs (Calcium-vitamin d tabs) .... 2 tabs daily 4)  Zyrtec 10 Mg Tabs (Cetirizine hcl) .Marland Kitchen.. 1po qd 5)  Advair Diskus 250-50 Mcg/dose Misc (Fluticasone-salmeterol) .Marland Kitchen.. 1 puff bid 6)  Flonase 50 Mcg/act Susp (Fluticasone propionate) .Marland Kitchen.. 1-2 sprays each nostril once daily #1 7)  Allopurinol 300 Mg Tabs (Allopurinol) .Marland Kitchen.. 1 by mouth qd 8)  Baby Aspirin 81 Mg Chew (Aspirin) 9)  Albuterol Mdi  10)  Vit D1000iuqd  .Marland Kitchen.. 1 by mouth qd 11)  Multivitamin  12)  Sertraline Hcl 50 Mg Tabs (Sertraline hcl) .Marland Kitchen.. 1 by mouth qd 13)  Omeprazole 20 Mg Cpdr (Omeprazole) .Marland Kitchen.. 1 by mouth qd 14)  Mucinex Dm  .Marland KitchenMarland Kitchen. 1 by mouth qd 15)  Tylenol As 2 Tabs Bid  16)  B-1  17)  Verapamil Hcl Cr 120 Mg Tbcr (Verapamil hcl) .Marland Kitchen.. 1 qd  Other Orders: TLB-A1C / Hgb A1C (Glycohemoglobin) (83036-A1C)  Hypertension Assessment/Plan:      The patient's hypertensive risk group is category B: At least one risk factor (excluding diabetes) with no target organ damage.  Today's blood pressure is 130/74.     Patient Instructions: 1)  Check your Blood Pressure regularly. If it is above: 130/85 ON AVERAGE  you should make an appointment.   Prescriptions:  VERAPAMIL HCL CR 120 MG  TBCR (VERAPAMIL HCL) 1 qd  #30 x 5   Entered and Authorized by:   Marga Melnick MD   Signed by:   Marga Melnick MD on 05/19/2008   Method used:   Electronically sent to ...       [facility] [address] [phone][facility]       [address]       [place], [place]  [address]       Ph: [phone] or [phone]       Fax: [phone]   RxID:   [id number] OMEPRAZOLE 20 MG CPDR (OMEPRAZOLE) 1 by mouth  qd  #30 Capsule x 5   Entered and Authorized by:   Marga Melnick MD   Signed by:   Marga Melnick MD on 05/19/2008   Method used:   Electronically sent to ...       [facility] [address] [phone]*       [address]       [place], [place]  [address]       Ph: [phone] or [phone]       Fax: [phone]   RxID:   [id number] SERTRALINE HCL 50 MG TABS (SERTRALINE HCL) 1 by mouth qd  #90 x 1   Entered and Authorized by:   Marga Melnick MD   Signed by:   Marga Melnick MD on 05/19/2008   Method used:   Electronically sent to ...       [facility] [address] [facility]       [address]       [place], [place]  [address]       Ph: [phone] or [phone]       Fax: [phone]   RxID:   [id number] ALBUTEROL MDI   #1 x 2   Entered and Authorized by:   Marga Melnick MD   Signed by:   Marga Melnick MD on 05/19/2008   Method used:   Electronically sent to ...       [facility]       [address]       [place], [place]  [address]       Ph: [phone] or [phone]       Fax: [phone]   RxID:   [id number] ALLOPURINOL 300 MG TABS (ALLOPURINOL) 1 by mouth qd  #30 x 6   Entered and Authorized by:   Marga Melnick MD   Signed by:   Marga Melnick MD on 05/19/2008   Method used:   Electronically sent to ...       [facility]       [address]       [place], [place]  [address]       Ph: [phone] or [phone]       Fax: [phone]   RxID:    [id number] ADVAIR DISKUS 250-50 MCG/DOSE MISC (FLUTICASONE-SALMETEROL) 1 puff bid  #3 x 3   Entered and Authorized by:   Marga Melnick MD   Signed by:   Marga Melnick MD on 05/19/2008   Method used:   Electronically sent to ...       [facility]       [address]       [place], [place]  [address]       Ph: [phone] or [phone]       Fax: [phone]   RxID:   [id number] ACCOLATE 20 MG TABS (ZAFIRLUKAST) once daily  #60 Tablet x 5   Entered and Authorized by:   Marga Melnick MD   Signed by:   Marga Melnick MD on 05/19/2008   Method used:   Electronically sent to ...       [facility]       [address]       [place], [place]  [address]       Ph: [phone] or [phone]       Fax: [phone]   RxID:   [id number] LORAZEPAM 1 MG TABS (LORAZEPAM)  1/2 tab q8hrs prn  #60 x 3   Entered and Authorized by:   Marga Melnick MD   Signed by:   Marga Melnick MD on 05/19/2008   Method used:   Electronically sent to ...       [facility]       [address]       [place], [place]  [address]       Ph: [phone] or [phone]       Fax: [phone]   RxID:   [id number]  ]

## 2011-01-28 NOTE — Progress Notes (Signed)
Summary: FYI  Phone Note Call from Patient   Caller: Spouse Reason for Call: Talk to Nurse Summary of Call: Dr. Alwyn Ren Patient is having trouble with her blood pressure. Alanys took her blood pressure this morning 115/68, yesterday it was 98/61, August 2nd 82/51. Yesterday she is having trouble with being dizzy. Patient has stop taking one of her blood pressure to see if that medication is making her having a low blood pressure. Husband is not sure which one. Oluwatomisin has went to work. Patient is off on wednesday would like to see the doctor.  Initial call taken by: Charolette Child,  August 07, 2008 10:27 AM  Follow-up for Phone Call        Patient doesnt have time off from work until Wednesday so i made an appointment for 11:15 for the patient.  Follow-up by: Charolette Child,  August 07, 2008 10:37 AM  Additional Follow-up for Phone Call Additional follow up Details #1::        NOTED./Chrae [facility]  August 07, 2008 10:41 AM  Called patient at work, offered earlier appointment, patient stated she really cant miss work. Patient stated Wed. would be her next avaliable. Patient ok'd if symptoms changed she needs to seek medical attention at Urgent Care or Emergency Room. Additional Follow-up by: Shonna Chock,  August 07, 2008 10:50 AM

## 2011-01-28 NOTE — Miscellaneous (Signed)
Summary: Orders Update  Medications Added NASONEX 50 MCG/ACT  SUSP (MOMETASONE FUROATE) Use 1 to 2 sprays in each nostril daily       Clinical Lists Changes  Medications: Changed medication from FLONASE 50 MCG/ACT  SUSP (FLUTICASONE PROPIONATE) 1-2 sprays each nostril once daily #1 to NASONEX 50 MCG/ACT  SUSP (MOMETASONE FUROATE) Use 1 to 2 sprays in each nostril daily - Signed Rx of NASONEX 50 MCG/ACT  SUSP (MOMETASONE FUROATE) Use 1 to 2 sprays in each nostril daily;  #50 x 3;  Signed;  Entered by: Ardyth Man;  Authorized by: Marga Melnick MD;  Method used: Electronic    Prescriptions: NASONEX 50 MCG/ACT  SUSP (MOMETASONE FUROATE) Use 1 to 2 sprays in each nostril daily  #50 x 3   Entered by:   Ardyth Man   Authorized by:   Marga Melnick MD   Signed by:   Ardyth Man on 05/19/2008   Method used:   Electronically sent to ...       [id number]       [address]       [place], [place]  [address]       Ph: [phone] or [phone]       Fax: [phone]   RxID:   [id number]

## 2011-01-28 NOTE — Progress Notes (Signed)
Summary: Refill Request: DUPLICATE  Phone Note Refill Request Call back at [phone] Message from:  Pharmacy on May 20, 2010 8:44 AM  Refills Requested: Medication #1:  ADVAIR DISKUS 250-50 MCG/DOSE MISC 1 puff bid   Dosage confirmed as above?Dosage Confirmed Karin [facility]  Next Appointment Scheduled: none Initial call taken by: Harold Barban,  May 20, 2010 8:45 AM  Follow-up for Phone Call        DUPLICATE, called the pharmach and left message on VM informing them rx was filled on 05/02/10, if not on file to please fill at this time. Follow-up by: Shonna Chock,  May 20, 2010 2:24 PM

## 2011-01-28 NOTE — Letter (Signed)
Summary: Results Follow up Letter  [place] [facility] [place]  [address] [place], [place] [address]   Phone: [phone]  Fax: [phone]    10/09/2008 MRN: [id number]  [facility] [address] [place], [place]  [address]  Dear [name],  The following are the results of your recent test(s):  Test         Result    Pap Smear:        Normal _____  Not Normal _____ Comments: ______________________________________________________ Cholesterol: LDL(Bad cholesterol):         Your goal is less than:         HDL (Good cholesterol):       Your goal is more than: Comments:  ______________________________________________________ Mammogram:        Normal _____  Not Normal _____ Comments:  ___________________________________________________________________ Hemoccult:        Normal _____  Not normal _______ Comments:    _____________________________________________________________________ Other Tests: PLEASE SEE ATTACHED LABS FROM 09/29/08 AND COMMENTS    We routinely do not discuss normal results over the telephone.  If you desire a copy of the results, or you have any questions about this information we can discuss them at your next office visit.   Sincerely,

## 2011-01-28 NOTE — Progress Notes (Signed)
Summary: ?bladder infection  Phone Note Call from Patient   Caller: Patient Summary of Call: pt left VM that she may possible have a bladder infection and would like dr hopper to call her in a med to [facility]. call pt back informed pt that she would need a OV to come in to give urine sample so that we can check for infection. offer pt appt pt decline stating that she will try some over the counter meds and if no better will call for appt....................Marland KitchenFelecia Deloach CMA  January 04, 2010 8:55 AM

## 2011-01-28 NOTE — Letter (Signed)
Summary: Results Follow up Letter  [place] [facility] [place]  [address] [place], [place] [address]   Phone: [phone]  Fax: [phone]    04/17/2008 MRN: [id number]  [facility] [address] [place], [place]  [address]  Dear [name],  The following are the results of your recent test(s):  Test         Result    Pap Smear:        Normal _____  Not Normal _____ Comments: ______________________________________________________ Cholesterol: LDL(Bad cholesterol):         Your goal is less than:         HDL (Good cholesterol):       Your goal is more than: Comments:  ______________________________________________________ Mammogram:        Normal _____  Not Normal _____ Comments:  ___________________________________________________________________ Hemoccult:        Normal _____  Not normal _______ Comments:    _____________________________________________________________________ Other Tests:  Please see attached results and comments   We routinely do not discuss normal results over the telephone.  If you desire a copy of the results, or you have any questions about this information we can discuss them at your next office visit.   Sincerely,

## 2011-01-28 NOTE — Letter (Signed)
Summary: Primary Care Consult Scheduled Letter  [place] [facility] [place]  [address] [place], [place] [address]   Phone: [phone]  Fax: [phone]      08/10/2008 MRN: [id number]  Lisa Larson [address] [place], [place]  [address]    Dear [name],      We have scheduled an appointment for you.  At the recommendation of Dr.Hopper, we have scheduled you a consult with Dr. Clifton James on August 24th at 11:00am.  Their address is [facility] [address] [place]. [address]. The office phone number is [phone].  If this appointment day and time is not convenient for you, please feel free to call the office of the doctor you are being referred to at the number listed above and reschedule the appointment.     It is important for you to keep your scheduled appointments. We are here to make sure you are given good patient care. If you have questions or you have made changes to your appointment, please notify us at  [phone], ask for [name].    Thank you,  Patient Care Coordinator [facility] [place]  Appended Document: Primary Care Consult Scheduled Letter Cardiology consult not received as yet. Due to reported lability of readings; 24 hr urine for catecholamines & metanephrines will be ordered if not done by Cardiology. Renal US not ordered as resistant HTN not documented @ [facility] to date. WF HopperMD

## 2011-01-28 NOTE — Progress Notes (Signed)
Summary: **Recent Labs**  Phone Note Call from Patient   Caller: Patient Summary of Call: MESSAGE LEFT ON VOICEMAIL-CC: REQUESTING LAB RESULTS. SPOKE WITH PATIENT, AWARE COPY OF LABS MAILED TODAY. BRIEFLY DISCUSSED LABS. PATIENT OK'D./[facility]  October 09, 2008 4:58 PM

## 2011-01-28 NOTE — Assessment & Plan Note (Signed)
Summary: flu shot.cbs  Nurse Visit    Prior Medications: LORAZEPAM 1 MG TABS (LORAZEPAM) 1/2 tab q8hrs prn ACCOLATE 20 MG TABS (ZAFIRLUKAST) once daily OSCAL 500/200 D-3  TABS (CALCIUM-VITAMIN D TABS) 2 tabs daily ZYRTEC 10 MG TABS (CETIRIZINE HCL) 1po qd ADVAIR DISKUS 250-50 MCG/DOSE MISC (FLUTICASONE-SALMETEROL) 1 puff bid NASONEX 50 MCG/ACT SUSP (MOMETASONE FUROATE) 1-2 sprays each nostril qd CLONIDINE HCL 0.2 MG TABS (CLONIDINE HCL) 1 two times a day to maintain BP < 130/85 ALLOPURINOL 300 MG TABS (ALLOPURINOL) 1 by mouth qd BABY ASPIRIN 81 MG CHEW (ASPIRIN)  ALBUTEROL MDI ()  VIT D1000IUQD () 1 by mouth qd MULTIVITAMIN ()  SERTRALINE HCL 50 MG TABS (SERTRALINE HCL) 1 by mouth qd OMEPRAZOLE 20 MG CPDR (OMEPRAZOLE) 1 by mouth qd MUCINEX DM () 1 by mouth qd TYLENOL AS 2 TABS BID ()  B-1 ()  Current Allergies: ! SULFA ! PERCODAN ! LOTENSIN ! VIOXX ! * DICLOFENAC    Orders Added: 1)  Influenza Vaccine NON MCR [00028]    ]  Influenza Vaccine    Vaccine Type: Fluvax Non-MCR    Site: right deltoid    Mfr: Sanofi Pasteur    Dose: 0.5 ml    Route: IM    Given by: Ardyth Man    Exp. Date: 06/27/2008    Lot #: [id number]    VIS given: 06/27/05 version given November 02, 2007.  Flu Vaccine Consent Questions    Do you have a history of severe allergic reactions to this vaccine? no    Any prior history of allergic reactions to egg and/or gelatin? no    Do you have a sensitivity to the preservative Thimersol? no    Do you have a past history of Guillan-Barre Syndrome? no    Do you currently have an acute febrile illness? no    Have you ever had a severe reaction to latex? no    Vaccine information given and explained to patient? yes    Are you currently pregnant? no

## 2011-01-28 NOTE — Letter (Signed)
Summary: Out of Work  [facility]  [address] [place], [place] [address]   Phone: [phone]  Fax: [phone]    November 07, 2010   Employee:  Janet Berlin    To Whom It May Concern:   For Medical reasons, please excuse the above named employee from work for the following dates:  Start:   11/06/10  End:   11/12/10  If you need additional information, please feel free to contact our office.         Sincerely,    Marga Melnick MD

## 2011-01-28 NOTE — Letter (Signed)
Summary: Out of Work  [facility]  [address] [place], [place] [address]   Phone: [phone]  Fax: [phone]    September 13, 2007   Employee:  Janet Berlin    To Whom It May Concern:   For Medical reasons, please excuse the above named employee from work for the following dates:  Start:   09/12/2007  End:   09/15/2007  If you need additional information, please feel free to contact our office.         Sincerely,    Chrae Marlynn Perking

## 2011-01-28 NOTE — Letter (Signed)
Summary: Results Follow up Letter  [place] [facility] [place]  [address] [place], [place] [address]   Phone: [phone]  Fax: [phone]    06/28/2008 MRN: [id number]  [facility] [address] [place], [place]  [address]  Dear [name],  The following are the results of your recent test(s):  Test         Result    Pap Smear:        Normal _____  Not Normal _____ Comments: ______________________________________________________ Cholesterol: LDL(Bad cholesterol):         Your goal is less than:         HDL (Good cholesterol):       Your goal is more than: Comments:  ______________________________________________________ Mammogram:        Normal _____  Not Normal _____ Comments:  ___________________________________________________________________ Hemoccult:        Normal _____  Not normal _______ Comments:    _____________________________________________________________________ Other Tests:  PLEASE SEE ATTACHED LABS AND COMMENTS  We routinely do not discuss normal results over the telephone.  If you desire a copy of the results, or you have any questions about this information we can discuss them at your next office visit.   Sincerely,

## 2011-01-28 NOTE — Assessment & Plan Note (Signed)
Summary: bp up.cbs  Medications Added CLONIDINE HCL 0.2 MG TABS (CLONIDINE HCL) 1/2  - 1 two times a day to maintain BP < 130/85 * VIT D1000IUQD  * OMEPROZOLE 20MG   * MULTIVITAMIN         Vital Signs:  Patient Profile:   70 Years Old Female Weight:      156.25 pounds Pulse rate:   68 / minute Pulse rhythm:   regular BP sitting:   138 / 82  (left arm) Cuff size:   large  Pt. in pain?   no  Vitals Entered By: Wendall Stade (June 30, 2007 1:43 PM)                Chief Complaint:  blood pressure elevated.  History of Present Illness: got upset at work yesterday and blood pressure has been elevated  Hypertension History:      She complains of headache, but denies chest pain, palpitations, dyspnea with exertion, orthopnea, PND, peripheral edema, visual symptoms, neurologic problems, syncope, and side effects from treatment.  She notes no problems with any antihypertensive medication side effects.  Further comments include: minor ha over R crown,no Rx;no epistaxis.        Positive major cardiovascular risk factors include female age 30 years old or older and hypertension.  Negative major cardiovascular risk factors include non-tobacco-user status.      Prior Medications :  LORAZEPAM 1 MG TABS (LORAZEPAM) 1/2 tab q8hrs prn ACCOLATE 20 MG TABS (ZAFIRLUKAST) once daily OSCAL 500/200 D-3  TABS (CALCIUM-VITAMIN D TABS)  ZYRTEC 10 MG TABS (CETIRIZINE HCL)  ADVAIR DISKUS 250-50 MCG/DOSE MISC (FLUTICASONE-SALMETEROL)  NASONEX 50 MCG/ACT SUSP (MOMETASONE FUROATE)  CLONIDINE HCL 0.2 MG TABS (CLONIDINE HCL) 1/2 two times a day ZOLOFT 50 MG TABS (SERTRALINE HCL) once daily ALLOPURINOL 300 MG TABS (ALLOPURINOL)  BABY ASPIRIN 81 MG CHEW (ASPIRIN)  * ALBUTEROL MDI     Current Allergies (reviewed today): ! SULFA ! PERCODAN ! LOTENSIN ! VIOXX ! * DICLOFENAC Updated/Current Medications (including changes made in today's visit):  LORAZEPAM 1 MG TABS (LORAZEPAM) 1/2 tab q8hrs prn  ACCOLATE 20 MG TABS (ZAFIRLUKAST) once daily OSCAL 500/200 D-3  TABS (CALCIUM-VITAMIN D TABS)  ZYRTEC 10 MG TABS (CETIRIZINE HCL)  ADVAIR DISKUS 250-50 MCG/DOSE MISC (FLUTICASONE-SALMETEROL)  NASONEX 50 MCG/ACT SUSP (MOMETASONE FUROATE)  CLONIDINE HCL 0.2 MG TABS (CLONIDINE HCL) 1/2  - 1 two times a day to maintain BP < 130/85 ZOLOFT 50 MG TABS (SERTRALINE HCL) once daily ALLOPURINOL 300 MG TABS (ALLOPURINOL)  BABY ASPIRIN 81 MG CHEW (ASPIRIN)  * ALBUTEROL MDI  * VIT D1000IUQD  * OMEPROZOLE 20MG   * MULTIVITAMIN        Physical Exam  General:     Well-developed,well-nourished,in no acute distress; alert,appropriate and cooperative throughout examination Eyes:     art narrowing Lungs:     Normal respiratory effort, chest expands symmetrically. Lungs are clear to auscultation, no crackles or wheezes. Heart:     grade1/2-1 /6 systolic murmur.   Abdomen:     Bowel sounds positive,abdomen soft and non-tender without masses, organomegaly or hernias noted. Msk:     osteoma & ganglion dorsum R foot Pulses:     R dorsalis pedis decreased and L dorsalis pedis decreased.   Extremities:     No clubbing, cyanosis, edema, or deformity noted with normal full range of motion of all joints.      Impression & Recommendations:  Problem # 1:  HYPERTENSION (ICD-401.9)  Her updated medication  list for this problem includes:    Clonidine Hcl 0.2 Mg Tabs (Clonidine hcl) .Marland Kitchen... 1/2  - 1 two times a day to maintain bp < 130/85   Medications Added to Medication List This Visit: 1)  Clonidine Hcl 0.2 Mg Tabs (Clonidine hcl) .... 1/2  - 1 two times a day to maintain bp < 130/85 2)  Vit D1000iuqd  3)  Omeprozole 20mg   4)  Multivitamin   Hypertension Assessment/Plan:      The patient's hypertensive risk group is category B: At least one risk factor (excluding diabetes) with no target organ damage.  Today's blood pressure is 138/82.     Patient Instructions: 1)  BP goal =average < 130/85;  Clonidine 0.2 mg 1/2  to 1 two times a day to control BP    Prescriptions: CLONIDINE HCL 0.2 MG TABS (CLONIDINE HCL) 1/2  - 1 two times a day to maintain BP < 130/85  #180 x 1   Entered and Authorized by:   Marga Melnick MD   Signed by:   Marga Melnick MD on 06/30/2007   Method used:   Print then Give to Patient   RxID:   [id number]

## 2011-01-28 NOTE — Letter (Signed)
Summary: [facility]  [facility]   Imported By: Lanelle Bal 08/22/2008 10:18:16  _____________________________________________________________________  External Attachment:    Type:   Image     Comment:   External Document

## 2011-01-28 NOTE — Assessment & Plan Note (Signed)
Summary: had chest xray this morning--need to discuss///sph   Vital Signs:  Patient profile:   70 year old female Weight:      156.5 pounds BMI:     29.68 Temp:     98.4 degrees F oral Pulse rate:   80 / minute Resp:     16 per minute BP sitting:   114 / 70  (left arm) Cuff size:   large  Vitals Entered By: Shonna Chock CMA (November 11, 2010 1:40 PM) CC: Follow-up on pneumponia, COPD follow-up   Primary Care Provider:  Laury Axon  CC:  Follow-up on pneumponia and COPD follow-up.  History of Present Illness: Pneumonia  Follow-Up:       The patient reports slight  shortness of breath &  chest tightness, cough, and heat intolerance, but denies wheezing, increased sputum, and nocturnal awakening.  The patient reports limitation of most activities and missed work days ( as recommended by me).  Current Rx includes Proair  MDI with spacer, but she has not used  this.  Medication use includes controller med daily, Dulera 1 puff two times a day . She is "25%" better after 5 days of Aveox.  Current Medications (verified): 1)  Lorazepam 1 Mg Tabs (Lorazepam) .... 1/2 Tab Q8hrs Prn 2)  Accolate 20 Mg Tabs (Zafirlukast) .... Once Daily 3)  Oscal 500/200 D-3  Tabs (Calcium-Vitamin D Tabs) .... 2 Tabs Daily 4)  Zyrtec 10 Mg Tabs (Cetirizine Hcl) .Marland Kitchen.. 1po Qd 5)  Advair Diskus 250-50 Mcg/dose Misc (Fluticasone-Salmeterol) .Marland Kitchen.. 1 Puff Bid 6)  Nasonex 50 Mcg/act  Susp (Mometasone Furoate) .... Use 1 To 2 Sprays in Each Nostril Daily 7)  Allopurinol 300 Mg Tabs (Allopurinol) .Marland Kitchen.. 1 By Mouth Qd 8)  Baby Aspirin 81 Mg Chew (Aspirin) 9)  Albuterol Mdi 10)  Vit D1000iuqd .Marland Kitchen.. 1 By Mouth Qd 11)  Multivitamin 12)  Sertraline Hcl 100 Mg Tabs (Sertraline Hcl) .Marland Kitchen.. 1 By Mouth Daily 13)  Omeprazole 20 Mg Cpdr (Omeprazole) .Marland Kitchen.. 1 By Mouth Qd 14)  Mucinex Dm .Marland Kitchen.. 1 By Mouth Qd 15)  Tylenol As 2 Tabs Bid 16)  B-1 17)  Diovan 160 Mg  Tabs (Valsartan) .Marland Kitchen.. 1 Once Daily After Benicar Samples Completed 18)   Amlodipine Besylate 10 Mg Tabs (Amlodipine Besylate) .Marland Kitchen.. 1 By Mouth Once Daily 19)  Prednisone 20 Mg Tabs (Prednisone) .Marland Kitchen.. 1 Two Times A Day With A Meal 20)  Dulera 200-5 Mcg/act Aero (Mometasone Furo-Formoterol Fum) .Marland Kitchen.. 1-2 Puffs Every 12 Hrs ; Gargle & Spit After Use 21)  Singulair 10 Mg Tabs (Montelukast Sodium) .Marland Kitchen.. 1 Once Daily  Allergies: 1)  ! Sulfa 2)  ! Percodan 3)  ! Lotensin 4)  ! Vioxx 5)  ! * Diclofenac  Review of Systems General:  Denies chills, fever, and sweats. Resp:  Denies chest pain with inspiration and coughing up blood.  Physical Exam  General:  in no acute distress; alert,appropriate and cooperative throughout examination Lungs:  Normal respiratory effort, chest expands symmetrically. Lungs : dry crackles  RLL > LLL; no  wheezes. O2 sats 92% Heart:  Normal rate and regular rhythm. S1 and S2 normal without gallop, murmur, click, rub or other extra sounds. Extremities:  No clubbing, cyanosis, edema. Cervical Nodes:  No lymphadenopathy noted Axillary Nodes:  No palpable lymphadenopathy   Impression & Recommendations:  Problem # 1:  PNEUMONIA, BILATERAL (ICD-486)  Complete Medication List: 1)  Lorazepam 1 Mg Tabs (Lorazepam) .... 1/2 tab q8hrs prn 2)  Accolate 20  Mg Tabs (Zafirlukast) .... Once daily 3)  Oscal 500/200 D-3 Tabs (Calcium-vitamin d tabs) .... 2 tabs daily 4)  Zyrtec 10 Mg Tabs (Cetirizine hcl) .Marland Kitchen.. 1po qd 5)  Advair Diskus 250-50 Mcg/dose Misc (Fluticasone-salmeterol) .Marland Kitchen.. 1 puff bid 6)  Nasonex 50 Mcg/act Susp (Mometasone furoate) .... Use 1 to 2 sprays in each nostril daily 7)  Allopurinol 300 Mg Tabs (Allopurinol) .Marland Kitchen.. 1 by mouth qd 8)  Baby Aspirin 81 Mg Chew (Aspirin) 9)  Albuterol Mdi  10)  Vit D1000iuqd  .Marland Kitchen.. 1 by mouth qd 11)  Multivitamin  12)  Sertraline Hcl 100 Mg Tabs (Sertraline hcl) .Marland Kitchen.. 1 by mouth daily 13)  Omeprazole 20 Mg Cpdr (Omeprazole) .Marland Kitchen.. 1 by mouth qd 14)  Mucinex Dm  .Marland KitchenMarland Kitchen. 1 by mouth qd 15)  Tylenol As 2 Tabs  Bid  16)  B-1  17)  Diovan 160 Mg Tabs (Valsartan) .Marland Kitchen.. 1 once daily after benicar samples completed 18)  Amlodipine Besylate 10 Mg Tabs (Amlodipine besylate) .Marland Kitchen.. 1 by mouth once daily 19)  Prednisone 20 Mg Tabs (Prednisone) .Marland Kitchen.. 1 two times a day with a meal 20)  Dulera 200-5 Mcg/act Aero (Mometasone furo-formoterol fum) .Marland Kitchen.. 1-2 puffs every 12 hrs ; gargle & spit after use 21)  Singulair 10 Mg Tabs (Montelukast sodium) .Marland Kitchen.. 1 once daily  Patient Instructions: 1)  Recommended remaining out of work  until 11/18/2010 due to bilateral pneumonia. Repeat chest Xray on Fri  11/ 18. Blow up 10 balloons / day. Dulera TWO puffs two times a day . Take Azithromycin 1 pil daily Tues -Fri. Align once daily for bowel changes.   Orders Added: 1)  Est. Patient Level III [[address]]

## 2011-01-28 NOTE — Progress Notes (Signed)
Summary: refill  Phone Note Refill Request Message from:  Fax from Pharmacy on November 04, 2010 10:39 AM  Refills Requested: Medication #1:  SERTRALINE HCL 100 MG TABS 1 by mouth daily [facility] - fax [phone]  Initial call taken by: Okey Regal [place],  November 04, 2010 10:40 AM    Prescriptions: SERTRALINE HCL 100 MG TABS (SERTRALINE HCL) 1 by mouth daily  #90 Tablet x 0   Entered by:   Shonna Chock CMA   Authorized by:   Marga Melnick MD   Signed by:   Shonna Chock CMA on 11/04/2010   Method used:   Electronically to        [facility] (retail)       [address]       [place], [place]  [address]       Ph: [phone]       Fax: [phone]   RxID:   [id number]

## 2011-01-28 NOTE — Medication Information (Signed)
Summary: Approval for Diovan/[employer]  Approval for Diovan/[employer]   Imported By: Lanelle Bal 10/26/2008 15:10:21  _____________________________________________________________________  External Attachment:    Type:   Image     Comment:   External Document

## 2011-01-28 NOTE — Letter (Signed)
Summary: Primary Care Appointment Letter  [place] [facility] [place]  [address] [place], [place] [address]   Phone: [phone]  Fax: [phone]    11/09/2009 MRN: [id number]  [facility] [address] [place], [place]  [address]  Dear [name],   Your Primary Care Physician Marga Melnick MD has indicated that:    __x_____it is time to schedule an appointment.    _______you missed your appointment on______ and need to call and          reschedule.    _______you need to have lab work done.    _______you need to schedule an appointment discuss lab or test results.    _______you need to call to reschedule your appointment that is                       scheduled on _________.     Please call our office as soon as possible. Our phone number is [phone]          _________. Please press option 1. Our office is open 8a-12noon and 1p-5p, Monday through Friday.     Thank you,    [facility] Scheduler

## 2011-01-28 NOTE — Progress Notes (Signed)
Summary: new rx   Phone Note Refill Request   Refills Requested: Medication #1:  ADVAIR DISKUS 250-50 MCG/DOSE MISC 1 puff bid [facility] [address] - fax [phone] - ph [phone] --- needs new rx    Method Requested: Fax to Local Pharmacy Initial call taken by: Okey Regal [place],  May 02, 2010 1:58 PM    Prescriptions: ADVAIR DISKUS 250-50 MCG/DOSE MISC (FLUTICASONE-SALMETEROL) 1 puff bid  #1 x 5   Entered by:   Shonna Chock   Authorized by:   Marga Melnick MD   Signed by:   Shonna Chock on 05/02/2010   Method used:   Electronically to        [facility] (retail)       [address]       [place], [place]  [address]       Ph: [phone]       Fax: [phone]   RxID:   [id number]

## 2011-01-28 NOTE — Letter (Signed)
Summary: Results Follow-up Letter  [facility]  [address] [place], [place] [address]   Phone: [phone]  Fax: [phone]    05/23/2008        Lisa Larson [address] [place], [place]  [address]  Dear Lisa Larson,   The following are the results of your recent test(s):  Test     Result     Pap Smear    Normal_______  Not Normal_____       Comments: _________________________________________________________ Cholesterol LDL(Bad cholesterol):          Your goal is less than:         HDL (Good cholesterol):        Your goal is more than: _________________________________________________________ Other Tests:   _________________________________________________________  Please call for an appointment Or _________________________________________________________ _________________________________________________________ _________________________________________________________  Sincerely,  Ardyth Man [place] at [facility]

## 2011-01-28 NOTE — Miscellaneous (Signed)
Summary: Orders Update   Clinical Lists Changes  Problems: Added new problem of BREAST MASS, LEFT (ICD-611.72) Orders: Added new Referral order of Radiology Referral (Radiology) - Signed

## 2011-01-28 NOTE — Progress Notes (Signed)
Summary: LORAZEPAM//HOP  Phone Note Refill Request   Refills Requested: Medication #1:  LORAZEPAM 1 MG TABS 1/2 tab q8hrs prn REFILL [facility]  LAST OV 09/29/08, LAST REFILL #60 X 5 ON 07/21/08  Initial call taken by: Kandice Hams,  March 09, 2009 4:21 PM  Follow-up for Phone Call        OK X 1 as directed Follow-up by: Marga Melnick,  March 09, 2009 4:53 PM      Prescriptions: LORAZEPAM 1 MG TABS (LORAZEPAM) 1/2 tab q8hrs prn  #60 x 0   Entered by:   Kandice Hams   Authorized by:   Marga Melnick   Signed by:   Kandice Hams on 03/09/2009   Method used:   Printed then faxed to ...       [facility] [address] [phone][facility] ([facility]ail[facility]       [address]       [place], [place]  [address]       Ph: [phone] or [phone]       Fax: [phone]   RxID:   [id number]

## 2011-01-28 NOTE — Assessment & Plan Note (Signed)
Summary: DISCUSS TEST RESULTS AND PNEUMONIA/KB   Vital Signs:  Patient profile:   70 year old female Weight:      160.8 pounds BMI:     30.49 Temp:     98.5 degrees F oral Pulse rate:   72 / minute Resp:     17 per minute BP sitting:   132 / 80  (left arm) Cuff size:   large  Vitals Entered By: Shonna Chock CMA (November 28, 2010 8:02 AM) CC: Discus Chest Xray (patient with mailed copy) , COPD follow-up, Fatigue   Primary Care Provider:  Laury Axon  CC:  Discus Chest Xray (patient with mailed copy) , COPD follow-up, and Fatigue.  History of Present Illness:      This is a 70 year old woman who presents  with persistant respiratory  symptoms in context of slowly resolving bilateral  PNA.  The patient reports shortness of breath and cough, but denies wheezing, increased sputum, and nocturnal awakening.  The patient reports no limitation in activities.  Current treatment includes MDI without spacer.  Medication use includes quick relief med rarely and controller med daily.  Fatigue is major issue & is compromising her abiity to work. The patient reports fatigue with minimal exertion.  The patient denies fever, night sweats, and weight loss.  Other symptoms include leg swelling.  The patient denies the following symptoms: orthopnea, PND, and melena.   "I felt really good on Prednisone"  Current Medications (verified): 1)  Lorazepam 1 Mg Tabs (Lorazepam) .... 1/2 Tab Q8hrs Prn 2)  Accolate 20 Mg Tabs (Zafirlukast) .... Once Daily 3)  Oscal 500/200 D-3  Tabs (Calcium-Vitamin D Tabs) .... 2 Tabs Daily 4)  Zyrtec 10 Mg Tabs (Cetirizine Hcl) .Marland Kitchen.. 1po Qd 5)  Nasonex 50 Mcg/act  Susp (Mometasone Furoate) .... Use 1 To 2 Sprays in Each Nostril Daily 6)  Allopurinol 300 Mg Tabs (Allopurinol) .Marland Kitchen.. 1 By Mouth Qd 7)  Baby Aspirin 81 Mg Chew (Aspirin) 8)  Albuterol Mdi 9)  Vit D1000iuqd .Marland Kitchen.. 1 By Mouth Qd 10)  Multivitamin 11)  Sertraline Hcl 100 Mg Tabs (Sertraline Hcl) .Marland Kitchen.. 1 By Mouth Daily 12)   Omeprazole 20 Mg Cpdr (Omeprazole) .Marland Kitchen.. 1 By Mouth Qd 13)  Mucinex Dm .Marland Kitchen.. 1 By Mouth Qd 14)  Tylenol As 2 Tabs Bid 15)  B-1 16)  Diovan 160 Mg  Tabs (Valsartan) .Marland Kitchen.. 1 Once Daily After Benicar Samples Completed 17)  Amlodipine Besylate 10 Mg Tabs (Amlodipine Besylate) .Marland Kitchen.. 1 By Mouth Once Daily 18)  Dulera 200-5 Mcg/act Aero (Mometasone Furo-Formoterol Fum) .Marland Kitchen.. 1-2 Puffs Every 12 Hrs ; Gargle & Spit After Use 19)  Singulair 10 Mg Tabs (Montelukast Sodium) .Marland Kitchen.. 1 Once Daily  Allergies: 1)  ! Sulfa 2)  ! Percodan 3)  ! Lotensin 4)  ! Vioxx 5)  ! * Diclofenac  Physical Exam  General:  Well-developed,well-nourished,in no acute distress; alert,appropriate and cooperative throughout examination Neck:  No deformities, masses, or tenderness noted. Lungs:  Normal respiratory effort, chest expands symmetrically. Lungs are clear to auscultation, no crackles or wheezes. Heart:  normal rate, regular rhythm, no gallop, no rub, no JVD, no HJR, and grade 1/2  /6 systolic murmur LSB.   Abdomen:  Bowel sounds positive,abdomen soft and non-tender without masses, organomegaly or hernias noted. Pulses:  R and L carotid,radial,dorsalis pedis and posterior tibial pulses are full and equal bilaterally Extremities:  No clubbing, cyanosis. OA finger changes. trace left & R pedal edema.   Neurologic:  alert & oriented X3 and DTRs symmetrical and normal except 1/2 + L knee.   Skin:  Intact without suspicious lesions or rashes Cervical Nodes:  No lymphadenopathy noted Axillary Nodes:  No palpable lymphadenopathy Psych:  Oriented X3, flat affect, and subdued.     Impression & Recommendations:  Problem # 1:  PNEUMONIA, BILATERAL (ICD-486)  slowly resolving; R/O immunocompromise   Orders: Venipuncture ([address]) TLB-CBC Platelet - w/Differential (85025-CBCD) T- * Misc. Laboratory test [phone]) Misc. Referral (Misc. Ref)  Problem # 2:  FATIGUE (ICD-780.79) new, debilitating Orders: Venipuncture  ([address]) TLB-BMP (Basic Metabolic Panel-BMET) (80048-METABOL) TLB-CBC Platelet - w/Differential (85025-CBCD) TLB-TSH (Thyroid Stimulating Hormone) (84443-TSH) TLB-Cortisol (82533-CORT) T- * Misc. Laboratory test [phone])  Problem # 3:  ASTHMA (ICD-493.90)  clinically stable on controller The following medications were removed from the medication list:    Prednisone (pak) 10 Mg Tabs (Prednisone) .Marland Kitchen... As per pack Her updated medication list for this problem includes:    Accolate 20 Mg Tabs (Zafirlukast) ..... Once daily    Dulera 200-5 Mcg/act Aero (Mometasone furo-formoterol fum) .Marland Kitchen... 1-2 puffs every 12 hrs ; gargle & spit after use    Singulair 10 Mg Tabs (Montelukast sodium) .Marland Kitchen... 1 once daily  Orders: Venipuncture ([address]) TLB-Cortisol (82533-CORT) T- * Misc. Laboratory test [phone]) Misc. Referral (Misc. 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 1 once daily after benicar samples completed  Complete Medication List: 1)  Lorazepam 1 Mg Tabs (Lorazepam) .... 1/2 tab q8hrs prn 2)  Accolate 20 Mg Tabs (Zafirlukast) .... Once daily 3)  Oscal 500/200 D-3 Tabs (Calcium-vitamin d tabs) .... 2 tabs daily 4)  Zyrtec 10 Mg Tabs (Cetirizine hcl) .Marland Kitchen.. 1po qd 5)  Nasonex 50 Mcg/act Susp (Mometasone furoate) .... Use 1 to 2 sprays in each nostril daily 6)  Allopurinol 300 Mg Tabs (Allopurinol) .Marland Kitchen.. 1 by mouth qd 7)  Baby Aspirin 81 Mg Chew (Aspirin) 8)  Albuterol Mdi  9)  Vit D1000iuqd  .Marland Kitchen.. 1 by mouth qd 10)  Multivitamin  11)  Sertraline Hcl 100 Mg Tabs (Sertraline hcl) .Marland Kitchen.. 1 by mouth daily 12)  Omeprazole 20 Mg Cpdr (Omeprazole) .Marland Kitchen.. 1 by mouth qd 13)  Mucinex Dm  .Marland KitchenMarland Kitchen. 1 by mouth qd 14)  Tylenol As 2 Tabs Bid  15)  B-1  16)  Diovan 160 Mg Tabs (Valsartan) .Marland Kitchen.. 1 once daily after benicar samples completed 17)  Amlodipine Besylate 10 Mg Tabs  (Amlodipine besylate) .Marland Kitchen.. 1 by mouth once daily 18)  Dulera 200-5 Mcg/act Aero (Mometasone furo-formoterol fum) .Marland Kitchen.. 1-2 puffs every 12 hrs ; gargle & spit after use 19)  Singulair 10 Mg Tabs (Montelukast sodium) .Marland Kitchen.. 1 once daily  Patient Instructions: 1)  Recommended remaining out of work for  12/1-12/05/2010 . Extensive testing required.   Orders Added: 1)  Est. Patient Level III [[address]] 2)  Venipuncture [[address]] 3)  TLB-BMP (Basic Metabolic Panel-BMET) [80048-METABOL] 4)  TLB-CBC Platelet - w/Differential [85025-CBCD] 5)  TLB-TSH (Thyroid Stimulating Hormone) [84443-TSH] 6)  TLB-Cortisol [82533-CORT] 7)  T- * Misc. Laboratory test [phone] 8)  Misc. Referral [Misc. Ref]

## 2011-01-28 NOTE — Letter (Signed)
Summary: Out of Work  [facility]  [address] [place], [place] [address]   Phone: [phone]  Fax: [phone]    October 26, 2008   Employee:  Janet Berlin    To Whom It May Concern:   For Medical reasons, please excuse the above named employee from work for the following dates:  Start:   October 26, 2008    End:   October 27, 2008    If you need additional information, please feel free to contact our office.         Sincerely,    Doristine Devoid

## 2011-01-28 NOTE — Assessment & Plan Note (Signed)
  Prescriptions: LORAZEPAM 1 MG TABS (LORAZEPAM) 1/2 tab q8hrs prn  #30 x 2   Entered and Authorized by:   Wendall Stade   Signed by:   Wendall Stade on 06/07/2007   Method used:   Historical   RxID:   [id number]

## 2011-01-28 NOTE — Miscellaneous (Signed)
Summary: Immunization Entry--Flu and pneumo vacc   Immunization History:  Influenza Immunization History:    Influenza:  historical (09/08/2009)  Pneumovax Immunization History:    Pneumovax:  historical (09/08/2009)

## 2011-01-28 NOTE — Assessment & Plan Note (Signed)
Summary: [email or web address]   Vital Signs:  Patient Profile:   70 Years Old Female Weight:      157 pounds Temp:     98.3 degrees F oral Pulse rate:   82 / minute BP sitting:   138 / 80  (left arm) Cuff size:   large  Vitals Entered By: Shonna Chock (September 13, 2007 3:01 PM)             Comments NEED WORK NOTE: YESTERDAY TODAY AND TOMORROW IF ALLOWED.     PCP:  Laury Axon  Chief Complaint:  LOW-GRADE FEVER, CONGESTION, PRODUCTIVE CIOUGH SINCE SAT., and URI symptoms.  History of Present Illness:  URI Symptoms      This is a 70 year old woman who presents with URI symptoms.  The symptoms began duration > 3 days ago.  Pt here c/o congestion and st , cough etc since Friday.  The patient reports nasal congestion, purulent nasal discharge, sore throat, and dry cough, but denies productive cough and earache.  The patient denies fever, low-grade fever (<100.5 degrees), fever of 100.5-103 degrees, fever of 103.1-104 degrees, fever to >104 degrees, stiff neck, dyspnea, wheezing, rash, vomiting, diarrhea, use of an antipyretic, and response to antipyretic.  The patient also reports headache.  The patient denies itchy watery eyes, itchy throat, sneezing, seasonal symptoms, response to antihistamine, muscle aches, and severe fatigue.  Risk factors for Strep sinusitis include tooth pain.  The patient denies the following risk factors for Strep sinusitis: unilateral facial pain, unilateral nasal discharge, poor response to decongestant, double sickening, Strep exposure, tender adenopathy, and absence of cough.    Current Allergies: ! SULFA ! PERCODAN ! LOTENSIN ! VIOXX ! * DICLOFENAC     Review of Systems      See HPI   Physical Exam  General:     Well-developed,well-nourished,in no acute distress; alert,appropriate and cooperative throughout examination Head:     Normocephalic and atraumatic without obvious abnormalities. No apparent alopecia or balding. Nose:     L frontal sinus  tenderness, L maxillary sinus tenderness, R frontal sinus tenderness, and R maxillary sinus tenderness.   Mouth:     Oral mucosa and oropharynx without lesions or exudates.  Teeth in good repair. Neck:     no cervical lymphadenopathy.   Lungs:     Normal respiratory effort, chest expands symmetrically. Lungs are clear to auscultation, no crackles or wheezes. Heart:     normal rate, regular rhythm, and no murmur.      Impression & Recommendations:  Problem # 1:  SINUSITIS, ACUTE NOS (ICD-461.9) OTC cough med Her updated medication list for this problem includes:    Nasonex 50 Mcg/act Susp (Mometasone furoate)    Augmentin 875-125 Mg Tabs (Amoxicillin-pot clavulanate) .Marland Kitchen... 1 by mouth two times a day Instructed on treatment. Call if symptoms persist or worsen.   Complete Medication List: 1)  Lorazepam 1 Mg Tabs (Lorazepam) .... 1/2 tab q8hrs prn 2)  Accolate 20 Mg Tabs (Zafirlukast) .... Once daily 3)  Oscal 500/200 D-3 Tabs (Calcium-vitamin d tabs) 4)  Zyrtec 10 Mg Tabs (Cetirizine hcl) 5)  Advair Diskus 250-50 Mcg/dose Misc (Fluticasone-salmeterol) 6)  Nasonex 50 Mcg/act Susp (Mometasone furoate) 7)  Clonidine Hcl 0.2 Mg Tabs (Clonidine hcl) .... 1/2  - 1 two times a day to maintain bp < 130/85 8)  Zoloft 50 Mg Tabs (Sertraline hcl) .... Once daily 9)  Allopurinol 300 Mg Tabs (Allopurinol) 10)  Baby Aspirin 81 Mg Chew (Aspirin) 11)  Albuterol Mdi  12)  Vit D1000iuqd  13)  Omeprozole 20mg   14)  Multivitamin  15)  Zyrtec Allergy 10 Mg Tabs (Cetirizine hcl) 16)  Sertraline Hcl 50 Mg Tabs (Sertraline hcl) 17)  Omeprazole 20 Mg Cpdr (Omeprazole) 18)  Meloxicam 7.5 Mg Tabs (Meloxicam) 19)  Augmentin 875-125 Mg Tabs (Amoxicillin-pot clavulanate) .Marland Kitchen.. 1 by mouth two times a day     Prescriptions: AUGMENTIN 875-125 MG  TABS (AMOXICILLIN-POT CLAVULANATE) 1 by mouth two times a day  #20 x 0   Entered and Authorized by:   Loreen Freud DO   Signed by:   Loreen Freud DO on  09/13/2007   Method used:   [facility] ...       [facility]   [address]       [address]       [place], [place]  [address]       Ph: [phone] or [phone]       Fax: [phone]   RxID:   [id number]  ]

## 2011-01-28 NOTE — Progress Notes (Signed)
Summary: Hop--Cheater ARB  Phone Note Call from Patient Call back at Home Phone [phone]   Caller: Daughter Summary of Call: Pt daughter is calling back with a cheaper ARB--cozaar,Diovan are cover under her insurance--[facility] [address] Initial call taken by: Freddy Jaksch,  July 17, 2008 11:09 AM  Follow-up for Phone Call        Diovan 160 will replace Benicar 40 (to start AFTER Benicar samples completed) Follow-up by: Marga Melnick MD,  July 18, 2008 8:00 AM  Additional Follow-up for Phone Call Additional follow up Details #1::        spoke with pt husband informed Diovan to replace Benicar, [facility], burt do not start until finishing samples of Benicar.Kandice Hams  July 18, 2008 11:42 AM  Additional Follow-up by: Kandice Hams,  July 18, 2008 11:42 AM    New/Updated Medications: DIOVAN 160 MG  TABS (VALSARTAN) 1 once daily AFTER Benicar samples completed   Prescriptions: DIOVAN 160 MG  TABS (VALSARTAN) 1 once daily AFTER Benicar samples completed  #30 x 5   Entered and Authorized by:   Marga Melnick MD   Signed by:   Marga Melnick MD on 07/18/2008   Method used:   Electronically [facility] to ...       [facility] [address] [id number]       [address]       [place], [place]  [address]       Ph: [phone] or [phone]       Fax: [phone]   RxID:   [id number]

## 2011-01-28 NOTE — Assessment & Plan Note (Signed)
Summary: 2 WKS OV--PH   Vital Signs:  Patient Profile:   71 Years Old Female Weight:      162 pounds Pulse rate:   70 / minute Resp:     15 per minute BP sitting:   132 / 90  (left arm)  Vitals Entered By: Doristine Devoid (June 26, 2008 11:27 AM)                 PCP:  Laury Axon  Chief Complaint:  2 week rov on bp.  History of Present Illness: Alcohol intake "better, as little as 2 /day"  Hypertension History:      She complains of headache, palpitations, neurologic problems, and side effects from treatment, but denies chest pain, dyspnea with exertion, orthopnea, PND, peripheral edema, visual symptoms, and syncope.  She notes the following problems with antihypertensive medication side effects: Diffuse nonspecific ha Rxed with Tylenol. Occa numbness LUE.  Further comments include: BP 119/73- 170/93 on Clonidine 0.2 mg two times a day & Verapamil 240 mg once daily .        Positive major cardiovascular risk factors include female age 83 years old or older and hypertension.  Negative major cardiovascular risk factors include non-tobacco-user status.       Current Allergies: ! SULFA ! PERCODAN ! LOTENSIN ! VIOXX ! * DICLOFENAC     Review of Systems  Eyes      Denies blurring, double vision, and vision loss-both eyes.  CV      Complains of palpitations.      palpitations @ night   Physical Exam  General:     in no acute distress;flat affect but cooperative throughout examination Lungs:     Normal respiratory effort, chest expands symmetrically. Lungs are clear to auscultation, no crackles or wheezes. Heart:     Normal rate and regular rhythm. S1 and S2 normal without gallop, murmur, click, rub. S4 Pulses:     R and L carotid,radial,dorsalis pedis and posterior tibial pulses are full and equal bilaterally Neurologic:     alert & oriented X3.      Impression & Recommendations:  Problem # 1:  HYPERTENSION (ICD-401.9) Assessment: Deteriorated  Her updated  medication list for this problem includes:    Clonidine Hcl 0.2 Mg Tabs (Clonidine hcl) .Marland Kitchen... 1 bid    Benicar 40 Mg Tabs (Olmesartan medoxomil)    Verapamil 240 mg once daily  Orders: Cardiology Referral (Cardiology) TLB-Creatinine, Blood (82565-CREA) TLB-BUN (Urea Nitrogen) (84520-BUN)   Problem # 2:  PALPITATIONS (ICD-785.1)  Orders: Cardiology Referral (Cardiology) TLB-Calcium (82310-CA) TLB-Potassium (K+) (84132-K) TLB-Magnesium (Mg) (83735-MG)   Complete Medication List: 1)  Lorazepam 1 Mg Tabs (Lorazepam) .... 1/2 tab q8hrs prn 2)  Accolate 20 Mg Tabs (Zafirlukast) .... Once daily 3)  Oscal 500/200 D-3 Tabs (Calcium-vitamin d tabs) .... 2 tabs daily 4)  Zyrtec 10 Mg Tabs (Cetirizine hcl) .Marland Kitchen.. 1po qd 5)  Advair Diskus 250-50 Mcg/dose Misc (Fluticasone-salmeterol) .Marland Kitchen.. 1 puff bid 6)  Nasonex 50 Mcg/act Susp (Mometasone furoate) .... Use 1 to 2 sprays in each nostril daily 7)  Allopurinol 300 Mg Tabs (Allopurinol) .Marland Kitchen.. 1 by mouth qd 8)  Baby Aspirin 81 Mg Chew (Aspirin) 9)  Albuterol Mdi  10)  Vit D1000iuqd  .Marland Kitchen.. 1 by mouth qd 11)  Multivitamin  12)  Sertraline Hcl 50 Mg Tabs (Sertraline hcl) .Marland Kitchen.. 1 by mouth qd 13)  Omeprazole 20 Mg Cpdr (Omeprazole) .Marland Kitchen.. 1 by mouth qd 14)  Mucinex Dm  .Marland KitchenMarland Kitchen. 1 by  mouth qd 15)  Tylenol As 2 Tabs Bid  16)  B-1  17)  Verapamil Hcl Cr 240 Mg Tbcr (verapamil Hcl)  .Marland Kitchen.. 1 qd 18)  Clonidine Hcl 0.2 Mg Tabs (Clonidine hcl) .Marland Kitchen.. 1 bid 19)  Benicar 40 Mg Tabs (Olmesartan medoxomil)  Hypertension Assessment/Plan:      The patient's hypertensive risk group is category B: At least one risk factor (excluding diabetes) with no target organ damage.  Today's blood pressure is 132/90.     Patient Instructions: 1)  Please schedule a follow-up appointment in 3 weeks.   Prescriptions: BENICAR 40 MG  TABS (OLMESARTAN MEDOXOMIL)   #42 x 0   Entered and Authorized by:   Marga Melnick MD   Signed by:   Marga Melnick MD on 06/26/2008   Method used:    Print then Give to Patient   RxID:   [id number] CLONIDINE HCL 0.2 MG  TABS (CLONIDINE HCL) 1 bid  #60 x 5   Entered and Authorized by:   Marga Melnick MD   Signed by:   Marga Melnick MD on 06/26/2008   Method used:   Electronically [facility] to ...       [facility] [address] [id number]       [address]       [place], [place]  [address]       Ph: [phone] or [phone]       Fax: [phone]   RxID:   [id number]  ]

## 2011-01-28 NOTE — Assessment & Plan Note (Signed)
Summary: acute/elevated bp   Vital Signs:  Patient Profile:   70 Years Old Female Weight:      158.0 pounds Temp:     98.2 degrees F oral Pulse rate:   94 / minute Resp:     16 per minute BP sitting:   172 / 104  (right arm) Cuff size:   regular  Vitals Entered By: Shonna Chock (June 12, 2008 11:10 AM)                 PCP:  Laury Axon  Chief Complaint:  ELEVATED BLOOD PRESSURE, HA'S, CHILLS, FEELS CUNFUSED, and TINGLING(HANDS) DISCUSS WORK NOTE.  History of Present Illness: Since Clonidine D/Ced  (due to mouth dryness) & Verapamil started ; BP is progressively elevated.  Hypertension History:      She complains of headache, palpitations, neurologic problems, and side effects from treatment, but denies chest pain, dyspnea with exertion, orthopnea, PND, peripheral edema, visual symptoms, and syncope.  She notes the following problems with antihypertensive medication side effects: Lotensin caused ? hoarseness.  Further comments include: BP on CCB  168/86- 202/108.        Positive major cardiovascular risk factors include female age 66 years old or older and hypertension.  Negative major cardiovascular risk factors include non-tobacco-user status.       Current Allergies (reviewed today): ! SULFA ! PERCODAN ! LOTENSIN ! VIOXX ! * DICLOFENAC   Social History:    Former Smoker    Alcohol use-yes (Husband & daughter state 1&1/2 liters)    No diet    Review of Systems  General      Complains of chills.      Denies fever and weight loss.  Eyes      Denies blurring, double vision, and vision loss-both eyes.  CV      Denies bluish discoloration of lips or nails and leg cramps with exertion.  Neuro      Complains of numbness and tingling.      Denies brief paralysis, inability to speak, and weakness.      CTS LUE. Diffuse headache , aching, lasts < 1 hour. Tylenol 2 two times a day as needed.   Physical Exam  General:     Well-developed,well-nourished,in no acute  distress; alert,appropriate and cooperative throughout examination Eyes:     No corneal or conjunctival inflammation noted. EOMI. Perrla. Funduscopic exam benign, without hemorrhages, exudates or papilledema. Vision grossly normal. Minimal ptosis OD Lungs:     Normal respiratory effort, chest expands symmetrically. Lungs are clear to auscultation, no crackles or wheezes. Heart:     Normal rate and regular rhythm. S1 and S2 normal without gallop, murmur, click, rub or other extra sounds. Abdomen:     Bowel sounds positive,abdomen soft and non-tender without masses, organomegaly or hernias noted.No AAA or bruits Pulses:     R and L carotid,radial,dorsalis pedis and posterior tibial pulses are full and equal bilaterally Extremities:     No clubbing, cyanosis, edema.DJD  deformity noted . Neurologic:     alert & oriented X3, cranial nerves II-XII intact, strength normal in all extremities, sensation intact to light touch, and DTRs symmetrical and normal.   Skin:     Minor ecchymoses  LUE Psych:     memory intact for recent and remote, normally interactive, good eye contact, and not anxious appearing.      Impression & Recommendations:  Problem # 1:  HYPERTENSION (ICD-401.9)  Her updated medication list for this problem includes:  Clonidine Hcl 0.2 Mg Tabs (Clonidine hcl) .Marland Kitchen... 1/2- 1bid  Orders: EKG w/ Interpretation (93000)   Problem # 2:  PALPITATIONS (ICD-785.1)  Problem # 3:  HEADACHE (ICD-784.0)  Her updated medication list for this problem includes:    Baby Aspirin 81 Mg Chew (Aspirin)   Complete Medication List: 1)  Lorazepam 1 Mg Tabs (Lorazepam) .... 1/2 tab q8hrs prn 2)  Accolate 20 Mg Tabs (Zafirlukast) .... Once daily 3)  Oscal 500/200 D-3 Tabs (Calcium-vitamin d tabs) .... 2 tabs daily 4)  Zyrtec 10 Mg Tabs (Cetirizine hcl) .Marland Kitchen.. 1po qd 5)  Advair Diskus 250-50 Mcg/dose Misc (Fluticasone-salmeterol) .Marland Kitchen.. 1 puff bid 6)  Nasonex 50 Mcg/act Susp (Mometasone  furoate) .... Use 1 to 2 sprays in each nostril daily 7)  Allopurinol 300 Mg Tabs (Allopurinol) .Marland Kitchen.. 1 by mouth qd 8)  Baby Aspirin 81 Mg Chew (Aspirin) 9)  Albuterol Mdi  10)  Vit D1000iuqd  .Marland Kitchen.. 1 by mouth qd 11)  Multivitamin  12)  Sertraline Hcl 50 Mg Tabs (Sertraline hcl) .Marland Kitchen.. 1 by mouth qd 13)  Omeprazole 20 Mg Cpdr (Omeprazole) .Marland Kitchen.. 1 by mouth qd 14)  Mucinex Dm  .Marland KitchenMarland Kitchen. 1 by mouth qd 15)  Tylenol As 2 Tabs Bid  16)  B-1  17)  Verapamil Hcl Cr 240 Mg Tbcr (verapamil Hcl)  .Marland Kitchen.. 1 qd 18)  Clonidine Hcl 0.2 Mg Tabs (Clonidine hcl) .... 1/2- 1bid  Hypertension Assessment/Plan:      The patient's hypertensive risk group is category B: At least one risk factor (excluding diabetes) with no target organ damage.  Today's blood pressure is 172/104.     Patient Instructions: 1)  Check your Blood Pressure regularly. If it is above:  !30/85 ON AVERAGE  on Clonidine & Verapamil you should make an appointment. 2)  Please schedule a follow-up appointment in 2 weeks. 3)  Recommended remaining out of work for 6/15 & 06/13/08.   Prescriptions: CLONIDINE HCL 0.2 MG  TABS (CLONIDINE HCL) 1/2- 1bid  #60 x 5   Entered and Authorized by:   Marga Melnick MD   Signed by:   Marga Melnick MD on 06/12/2008   Method used:   Print then Give to Patient   RxID:   [id number] VERAPAMIL HCL CR 240 MG  TBCR (VERAPAMIL HCL) 1 qd  #30 x 5   Entered and Authorized by:   Marga Melnick MD   Signed by:   Marga Melnick MD on 06/12/2008   Method used:   Print then Give to Patient   RxID:   [id number]  ]

## 2011-01-28 NOTE — Progress Notes (Signed)
Summary: LORAZEPAM REFILL   Phone Note Refill Request Message from:  Fax from Pharmacy on December 10, 2009 4:40 PM  Refills Requested: Medication #1:  LORAZEPAM 1 MG TABS 1/2 tab q8hrs prn   Last Refilled: 05/04/2009 HAS PENDING OV SCHEDULED FOR 12/26/09  [facility]:[phone], ZO:[phone]  Initial call taken by: Doristine Devoid,  December 10, 2009 4:40 PM  Follow-up for Phone Call        OK X 1 Follow-up by: Marga Melnick MD,  December 11, 2009 5:59 AM    Prescriptions: LORAZEPAM 1 MG TABS (LORAZEPAM) 1/2 tab q8hrs prn  #60 x 0   Entered by:   Shonna Chock   Authorized by:   Marga Melnick MD   Signed by:   Shonna Chock on 12/11/2009   Method used:   Printed then faxed to ...       [facility] [phone][facility] (retail[facility]       [address]       [place], [place]  [address]       Ph: [phone] or [phone]       Fax: [phone]   RxID:   [id number]

## 2011-01-28 NOTE — Assessment & Plan Note (Signed)
Summary: 3weeks--tl   Vital Signs:  Patient Profile:   70 Years Old Female Weight:      160.4 pounds Temp:     97.7 degrees F oral Pulse rate:   72 / minute Resp:     14 per minute BP sitting:   136 / 82  (left arm) Cuff size:   regular  Vitals Entered By: Shonna Chock (July 17, 2008 9:38 AM)                 PCP:  Laury Axon  Chief Complaint:  FOLLOW-UP (BLOOD-PRESSURE).  History of Present Illness: Holter monitor reviewed ; rare SVE & couplets. Only stimulants are chocolate & coffee.  Hypertension History:      She complains of headache, palpitations, and neurologic problems, but denies chest pain, dyspnea with exertion, orthopnea, PND, peripheral edema, visual symptoms, syncope, and side effects from treatment.  She notes no problems with any antihypertensive medication side effects.  Further comments include: BP @ home varies 109/67-169/102. Headache after husband came back to town.        Positive major cardiovascular risk factors include female age 61 years old or older and hypertension.  Negative major cardiovascular risk factors include non-tobacco-user status.       Current Allergies: ! SULFA ! PERCODAN ! LOTENSIN ! VIOXX ! * DICLOFENAC     Review of Systems  Eyes      Denies blurring, double vision, and vision loss-both eyes.  Neuro      Non specific diffuse ha; Rx: Tylenol. Numbness LUE X 1 yr. Dr Jimmy Footman Dxed CTS   Physical Exam  General:     ,well-nourished,in no acute distress; alert,anxious but  cooperative throughout examination Lungs:     Normal respiratory effort, chest expands symmetrically. Lungs are clear to auscultation, no crackles or wheezes. Heart:     Normal rate and regular rhythm. S1 and S2 normal without gallop, murmur, click, rub . S4 with slurring Pulses:     R and L carotid,radial,dorsalis pedis and posterior tibial pulses are full and equal bilaterally Extremities:     No edema Psych:     moderately anxious.  "Don't ever  stop my Zoloft"    Impression & Recommendations:  Problem # 1:  PALPITATIONS (ICD-785.1)  Problem # 2:  HYPERTENSION (ICD-401.9)  Her updated medication list for this problem includes:    Clonidine Hcl 0.2 Mg Tabs (Clonidine hcl) .Marland Kitchen... 1 bid    Benicar 40 Mg Tabs (Olmesartan medoxomil)   Problem # 3:  HEADACHE (ICD-784.0)  Her updated medication list for this problem includes:    Baby Aspirin 81 Mg Chew (Aspirin)   Complete Medication List: 1)  Lorazepam 1 Mg Tabs (Lorazepam) .... 1/2 tab q8hrs prn 2)  Accolate 20 Mg Tabs (Zafirlukast) .... Once daily 3)  Oscal 500/200 D-3 Tabs (Calcium-vitamin d tabs) .... 2 tabs daily 4)  Zyrtec 10 Mg Tabs (Cetirizine hcl) .Marland Kitchen.. 1po qd 5)  Advair Diskus 250-50 Mcg/dose Misc (Fluticasone-salmeterol) .Marland Kitchen.. 1 puff bid 6)  Nasonex 50 Mcg/act Susp (Mometasone furoate) .... Use 1 to 2 sprays in each nostril daily 7)  Allopurinol 300 Mg Tabs (Allopurinol) .Marland Kitchen.. 1 by mouth qd 8)  Baby Aspirin 81 Mg Chew (Aspirin) 9)  Albuterol Mdi  10)  Vit D1000iuqd  .Marland Kitchen.. 1 by mouth qd 11)  Multivitamin  12)  Sertraline Hcl 50 Mg Tabs (Sertraline hcl) .Marland Kitchen.. 1& 1/2 qd 13)  Omeprazole 20 Mg Cpdr (Omeprazole) .Marland Kitchen.. 1 by mouth qd 14)  Mucinex Dm  .Marland KitchenMarland Kitchen. 1 by mouth qd 15)  Tylenol As 2 Tabs Bid  16)  B-1  17)  Verapamil Hcl Cr 240 Mg Tbcr (verapamil Hcl)  .Marland Kitchen.. 1 qd 18)  Clonidine Hcl 0.2 Mg Tabs (Clonidine hcl) .Marland Kitchen.. 1 bid 19)  Benicar 40 Mg Tabs (Olmesartan medoxomil)  Hypertension Assessment/Plan:      The patient's hypertensive risk group is category B: At least one risk factor (excluding diabetes) with no target organ damage.  Today's blood pressure is 136/82.     Patient Instructions: 1)  Check your Blood Pressure regularly. If it is above:  130/85 ON AVERAGE you should make an appointment. Avoid stimulants as discussed.Determine which is preferred ARB (now on Benicar 40 mg)   Prescriptions: BENICAR 40 MG  TABS (OLMESARTAN MEDOXOMIL)   #30 x 5   Entered and  Authorized by:   Marga Melnick MD   Signed by:   Marga Melnick MD on 07/17/2008   Method used:   Print then Give to Patient   RxID:   [id number] SERTRALINE HCL 50 MG TABS (SERTRALINE HCL) 1& 1/2 qd  #45 x 5   Entered and Authorized by:   Marga Melnick MD   Signed by:   Marga Melnick MD on 07/17/2008   Method used:   Electronically sent to ...       [id number]       [address]       [place], [place]  [address]       Ph: [phone] or [phone]       Fax: [phone]   RxID:   [id number]  ]

## 2011-01-28 NOTE — Progress Notes (Signed)
Summary: PT WANTS TO CHANGE FROM NASONAX TO [facility]  Phone Note Call from Patient Call back at Home Phone [phone]   Caller: Patient Reason for Call: Refill Medication, Talk to Nurse Summary of Call: DR Eleaner Dibartolo// PT IS TAKING NASONAX AND WOULD LIKE TO CHANGE TO FLONASE. PT WANTS TO KNOW IF ITS OK WITH DR Vy Badley. PT SAYS THE OTHER MED IS LIKE $50 FOR EACH RX. [facility] [address] Initial call taken by: Job Founds,  February 14, 2008 8:55 AM  Follow-up for Phone Call        OK for generic Flonase X 1 year Follow-up by: Marga Melnick MD,  February 14, 2008 6:08 PM         Appended Document: PT WANTS TO CHANGE FROM NASONAX TO FLONASE rx chanfed from nasonex to flonase and [facility] left message for patient

## 2011-01-28 NOTE — Assessment & Plan Note (Signed)
Summary: 2 week follow-up prediabetes//fd   Vital Signs:  Patient profile:   70 year old female Height:      61 inches Weight:      156.6 pounds BMI:     29.70 Temp:     97.9 degrees F oral Pulse rate:   72 / minute Resp:     17 per minute BP sitting:   122 / 74  (left arm) Cuff size:   large  Vitals Entered By: Shonna Chock (May 04, 2009 9:18 AM) CC: 1.) 2 WEEK FOLLOW-UP: ONGOING PRODUCTIVE COUGH-DIARRHEA (WORSE SINCE TAKING ABX)  2.)REFILL LORAZEPAM,ACCOLATE,ADVAIR,ALLOPURINOL,NASONEX,SERTRALINE   Primary Care Provider:  Laury Axon  CC:  1.) 2 WEEK FOLLOW-UP: ONGOING PRODUCTIVE COUGH-DIARRHEA (WORSE SINCE TAKING ABX)  2.)REFILL LORAZEPAM, ACCOLATE, ADVAIR, ALLOPURINOL, NASONEX, and SERTRALINE.  History of Present Illness: Watery stool worse on Augmentin (completed 04/30/09); minimal diarrhea pre antibiotics as per husband. UV:OZDGUYQIHK 2 mg  with benefit. Residual productive cough with yellow sputum. PMH esophageal stricture ; no PMH colitis. Colonoscopy neg 2001.  Allergies: 1)  ! Sulfa 2)  ! Percodan 3)  ! Lotensin 4)  ! Vioxx 5)  ! * Diclofenac  Past History:  Past Medical History:    Asthma    Hypertension    Osteoporosis    GERD    Depression    Hyperlipidemia    Anxiety    Fractured shoulder 9/09,Dr Turner Daniels   Past Surgical History:    TAH+ BSO-dysfunctional bleeding    T&A    Colonoscopy-2001(due 2011)    Sinus surgery-2001    Appendectomy    Esophageal dilation X1, Dr Juanda Chance  Family History:    Family History Diabetes 1st degree relative    Father: arthritis,RA,PTE, HTN    Mother: HTN, Alheimer's    Siblings: bro HTN, DM; bro  NHL,esophageal stricture,HTN    daughter IgA deficiency; son laryngeal CA(sarcoma)  Review of Systems General:  Complains of fever and sweats; denies chills; Weight down 1#; normal T 97; T up to 98.9. ENT:  Denies ear discharge, earache, nasal congestion, and sinus pressure; No frontal headache or facial pain or nasal purulence.Marland Kitchen  Resp:  Complains of cough and sputum productive; denies chest pain with inspiration, coughing up blood, shortness of breath, and wheezing. GI:  See HPI; Denies abdominal pain, bloody stools, constipation, dark tarry stools, indigestion, nausea, and vomiting.  Physical Exam  General:  in no acute distress; alert,appropriate and cooperative throughout examination Eyes:  No corneal or conjunctival inflammation noted.No icterus Ears:  External ear exam shows no significant lesions or deformities.  Otoscopic examination reveals clear canals, tympanic membranes are intact bilaterally without bulging, retraction, inflammation or discharge. Hearing is grossly normal bilaterally. Nose:  External nasal examination shows no deformity or inflammation. Nasal mucosa are pink and moist without lesions or exudates. Mouth:  Oral mucosa and oropharynx without lesions or exudates.  Teeth in good repair. Lungs:  Normal respiratory effort, chest expands symmetrically. Lungs are clear to auscultation, no crackles or wheezes. Heart:  Normal rate and regular rhythm. S1 and S2 normal without gallop, murmur, click, rub.S4 with slurring Abdomen:  Bowel sounds positive,abdomen soft and non-tender without masses, organomegaly or hernias noted. Skin:  Intact without suspicious lesions or rashes. No tenting or jaundice Cervical Nodes:  No lymphadenopathy noted Axillary Nodes:  No palpable lymphadenopathy   Impression & Recommendations:  Problem # 1:  BRONCHITIS-ACUTE (ICD-466.0)  Her updated medication list for this problem includes:    Accolate 20 Mg Tabs (Zafirlukast) .Marland KitchenMarland KitchenMarland KitchenMarland Kitchen  Once daily    Advair Diskus 250-50 Mcg/dose Misc (Fluticasone-salmeterol) .Marland Kitchen... 1 puff bid    Metronidazole 500 Mg Tabs (Metronidazole) .Marland Kitchen... 1 three times a day ;no alcohol on this  Orders: Venipuncture ([address]) TLB-CBC Platelet - w/Differential (85025-CBCD)  Problem # 2:  DIARRHEA (ICD-787.91)  Orders: Venipuncture (60454) T-Culture,  C-Diff Toxin A/B (09811-91478) T-Stool Giardia / Crypto- EIA (29562) T-Stool for O&P (13086-57846) TLB-CBC Platelet - w/Differential (85025-CBCD) TLB-Creatinine, Blood (82565-CREA) TLB-Potassium (K+) (84132-K) TLB-BUN (Urea Nitrogen) (84520-BUN)  Problem # 3:  HYPERGLYCEMIA, FASTING (ICD-790.29)  Orders: Venipuncture (96295) TLB-A1C / Hgb A1C (Glycohemoglobin) (83036-A1C)  Problem # 4:  HYPERTRIGLYCERIDEMIA (ICD-272.1)  Orders: Venipuncture (28413) TLB-Lipid Panel (80061-LIPID)  Problem # 5:  GOUT, UNSPECIFIED (ICD-274.9)  Her updated medication list for this problem includes:    Allopurinol 300 Mg Tabs (Allopurinol) .Marland Kitchen... 1 by mouth qd  Orders: TLB-Uric Acid, Blood (84550-URIC)  Complete Medication List: 1)  Lorazepam 1 Mg Tabs (Lorazepam) .... 1/2 tab q8hrs prn 2)  Accolate 20 Mg Tabs (Zafirlukast) .... Once daily 3)  Oscal 500/200 D-3 Tabs (Calcium-vitamin d tabs) .... 2 tabs daily 4)  Zyrtec 10 Mg Tabs (Cetirizine hcl) .Marland Kitchen.. 1po qd 5)  Advair Diskus 250-50 Mcg/dose Misc (Fluticasone-salmeterol) .Marland Kitchen.. 1 puff bid 6)  Nasonex 50 Mcg/act Susp (Mometasone furoate) .... Use 1 to 2 sprays in each nostril daily 7)  Allopurinol 300 Mg Tabs (Allopurinol) .Marland Kitchen.. 1 by mouth qd 8)  Baby Aspirin 81 Mg Chew (Aspirin) 9)  Albuterol Mdi  10)  Vit D1000iuqd  .Marland Kitchen.. 1 by mouth qd 11)  Multivitamin  12)  Sertraline Hcl 100 Mg Tabs (Sertraline hcl) .Marland Kitchen.. 1 by mouth daily 13)  Omeprazole 20 Mg Cpdr (Omeprazole) .Marland Kitchen.. 1 by mouth qd 14)  Mucinex Dm  .Marland KitchenMarland Kitchen. 1 by mouth qd 15)  Tylenol As 2 Tabs Bid  16)  B-1  17)  Diovan 160 Mg Tabs (Valsartan) .Marland Kitchen.. 1 once daily after benicar samples completed 18)  Amlodipine Besylate 10 Mg Tabs (Amlodipine besylate) .Marland Kitchen.. 1 by mouth once daily 19)  Metronidazole 500 Mg Tabs (Metronidazole) .Marland Kitchen.. 1 three times a day ;no alcohol on this  Patient Instructions: 1)  Follow [employer] low carb diet; avoid High Fructose Corn Syrup if #1,2 or #3 on label of  food/beverage. Prescriptions: METRONIDAZOLE 500 MG TABS (METRONIDAZOLE) 1 three times a day ;NO alcohol on this  #30 x 0   Entered and Authorized by:   Marga Melnick MD   Signed by:   Shonna Chock on 05/04/2009   Method used:   Print then Give to Patient   RxID:   [id number] SERTRALINE HCL 100 MG TABS (SERTRALINE HCL) 1 by mouth daily  #90 x 1   Entered and Authorized by:   Marga Melnick MD   Signed by:   Marga Melnick MD on 05/04/2009   Method used:   Print then Give to Patient   RxID:   [id number] ALLOPURINOL 300 MG TABS (ALLOPURINOL) 1 by mouth qd  #90 x 3   Entered and Authorized by:   Marga Melnick MD   Signed by:   Marga Melnick MD on 05/04/2009   Method used:   Print then Give to Patient   RxID:   [id number] NASONEX 50 MCG/ACT  SUSP (MOMETASONE FUROATE) Use 1 to 2 sprays in each nostril daily  #17 Gram x 11   Entered and Authorized by:   Marga Melnick MD   Signed by:   Marga Melnick MD on 05/04/2009  Method used:   Print then Give to Patient   RxID:   [id number] ADVAIR DISKUS 250-50 MCG/DOSE MISC (FLUTICASONE-SALMETEROL) 1 puff bid  #1 x 11   Entered and Authorized by:   Marga Melnick MD   Signed by:   Marga Melnick MD on 05/04/2009   Method used:   Print then Give to Patient   RxID:   [id number] LORAZEPAM 1 MG TABS (LORAZEPAM) 1/2 tab q8hrs prn  #60 x 3   Entered and Authorized by:   Marga Melnick MD   Signed by:   Marga Melnick MD on 05/04/2009   Method used:   Print then Give to Patient   RxID:   [id number] ACCOLATE 20 MG TABS (ZAFIRLUKAST) once daily  #90 x 3   Entered and Authorized by:   Marga Melnick MD   Signed by:   Marga Melnick MD on 05/04/2009   Method used:   Print then Give to Patient   RxID:   [id number]

## 2011-01-28 NOTE — Progress Notes (Signed)
Summary: prior auth APPROVED forDiovan //[employer]  Phone Note Call from Patient   Caller: Patient Call For: diovan 160 Summary of Call: pt called and says she cannot get her Diovan [facility] spoke with pharmaciy who says pt needs prior authroization Initial call taken by: Kandice Hams,  October 24, 2008 3:22 PM  Follow-up for Phone Call        Spoke with Rosann Auerbach prior auth APPROVED INDEFINITE [facility] informed and pt informed .Kandice Hams  October 24, 2008 3:37 PM  Follow-up by: Kandice Hams,  October 24, 2008 3:37 PM

## 2011-01-28 NOTE — Miscellaneous (Signed)
Summary: Discharge/[facility]  Discharge/[facility]   Imported By: Lanelle Bal 11/29/2008 09:30:22  _____________________________________________________________________  External Attachment:    Type:   Image     Comment:   External Document

## 2011-01-28 NOTE — Progress Notes (Signed)
Summary: Refill Request  Phone Note Refill Request Call back at [phone] Message from:  Pharmacy on May 30, 2010 4:20 PM  Refills Requested: Medication #1:  ALLOPURINOL 300 MG TABS 1 by mouth qd   Dosage confirmed as above?Dosage Confirmed   Supply Requested: 3 months   Last Refilled: 03/01/2010 Karin Golden on [address]  Next Appointment Scheduled: none Initial call taken by: Harold Barban,  May 30, 2010 4:20 PM    Prescriptions: ALLOPURINOL 300 MG TABS (ALLOPURINOL) 1 by mouth qd  #90 x 2   Entered by:   Shonna Chock   Authorized by:   Marga Melnick MD   Signed by:   Shonna Chock on 05/30/2010   Method used:   Electronically to        [facility] (retail)       [address]       [place], [place]  [address]       Ph: [phone]       Fax: [phone]   RxID:   [id number]

## 2011-01-28 NOTE — Assessment & Plan Note (Signed)
Summary: bronchitis--acute only//tl   Vital Signs:  Patient Profile:   70 Years Old Female Weight:      157 pounds Temp:     98.8 degrees F oral Pulse rate:   64 / minute Pulse rhythm:   regular Resp:     15 per minute BP sitting:   120 / 74  (left arm) Cuff size:   large  Pt. in pain?   no  Vitals Entered By: Wendall Stade (December 13, 2007 4:27 PM)                  PCP:  Laury Axon  Chief Complaint:  acute feeling bad and Cough.  History of Present Illness: sick since this weekend, aching all over, sore throat, headache,coughing green, fever, chills, and aches  all over  Cough      This is a 70 year old woman who presents with Cough.  The patient reports productive cough and malaise, but denies non-productive cough, pleuritic chest pain, shortness of breath, exertional dyspnea, fever, and hemoptysis.  Associated symtpoms include cold/URI symptoms, sore throat, and nasal congestion.  Ineffective prior treatments have included OTC cough medication.  Risk factors include recurrent sinus infections and history of asthma.    Current Allergies (reviewed today): ! SULFA ! PERCODAN ! LOTENSIN ! VIOXX ! * DICLOFENAC     Review of Systems  Eyes      Complains of discharge, eye pain, and red eye.      Denies vision loss-1 eye.      in OD 12/14, better today  ENT      Complains of sinus pressure.      Denies earache.      Facial pain, dental pain   Physical Exam  General:     Well-developed,well-nourished,in no acute distress; alert,appropriate and cooperative throughout examination Eyes:     No corneal or conjunctival inflammation noted. EOMI. Perrla. Vision grossly normal. Ears:     External ear exam shows no significant lesions or deformities.  Otoscopic examination reveals clear canals, tympanic membranes are intact bilaterally without bulging, retraction, inflammation or discharge. Hearing is grossly normal bilaterally. Nose:     External nasal examination  shows no deformity or inflammation. Nasal mucosa are pink and moist without lesions or exudates. Mouth:     Uvula erythematous; slightly hoarse Lungs:     Normal respiratory effort, chest expands symmetrically. Lungs are clear to auscultation, no crackles or wheezes. Cervical Nodes:     No lymphadenopathy noted Axillary Nodes:     No palpable lymphadenopathy    Impression & Recommendations:  Problem # 1:  BRONCHITIS-ACUTE (ICD-466.0)  Her updated medication list for this problem includes:    Accolate 20 Mg Tabs (Zafirlukast) ..... Once daily    Advair Diskus 250-50 Mcg/dose Misc (Fluticasone-salmeterol) .Marland Kitchen... 1 puff bid    Cefuroxime Axetil 250 Mg Tabs (Cefuroxime axetil) .Marland Kitchen... 1 two times a day   Problem # 2:  URI (ICD-465.9)  Her updated medication list for this problem includes:    Zyrtec 10 Mg Tabs (Cetirizine hcl) .Marland Kitchen... 1po qd    Baby Aspirin 81 Mg Chew (Aspirin)   Complete Medication List: 1)  Lorazepam 1 Mg Tabs (Lorazepam) .... 1/2 tab q8hrs prn 2)  Accolate 20 Mg Tabs (Zafirlukast) .... Once daily 3)  Oscal 500/200 D-3 Tabs (Calcium-vitamin d tabs) .... 2 tabs daily 4)  Zyrtec 10 Mg Tabs (Cetirizine hcl) .Marland Kitchen.. 1po qd 5)  Advair Diskus 250-50 Mcg/dose Misc (Fluticasone-salmeterol) .Marland Kitchen.. 1 puff  bid 6)  Nasonex 50 Mcg/act Susp (Mometasone furoate) .Marland Kitchen.. 1-2 sprays each nostril qd 7)  Clonidine Hcl 0.2 Mg Tabs (Clonidine hcl) .Marland Kitchen.. 1 two times a day to maintain bp < 130/85 8)  Allopurinol 300 Mg Tabs (Allopurinol) .Marland Kitchen.. 1 by mouth qd 9)  Baby Aspirin 81 Mg Chew (Aspirin) 10)  Albuterol Mdi  11)  Vit D1000iuqd  .Marland Kitchen.. 1 by mouth qd 12)  Multivitamin  13)  Sertraline Hcl 50 Mg Tabs (Sertraline hcl) .Marland Kitchen.. 1 by mouth qd 14)  Omeprazole 20 Mg Cpdr (Omeprazole) .Marland Kitchen.. 1 by mouth qd 15)  Mucinex Dm  .Marland KitchenMarland Kitchen. 1 by mouth qd 16)  Tylenol As 2 Tabs Bid  17)  B-1  18)  Cefuroxime Axetil 250 Mg Tabs (Cefuroxime axetil) .Marland Kitchen.. 1 two times a day   Patient Instructions: 1)  Delsym as needed  cough. 2)  Drink as much fluid as you can tolerate for the next few days. 3)  Recommended remaining out of work for 12/14,12/15 & 12/14/07.    Prescriptions: CEFUROXIME AXETIL 250 MG  TABS (CEFUROXIME AXETIL) 1 two times a day  #20 x 0   Entered and Authorized by:   Marga Melnick MD   Signed by:   Marga Melnick MD on 12/13/2007   Method used:   Print then Give to Patient   RxID:   [id number]  ]

## 2011-01-28 NOTE — Progress Notes (Signed)
Summary: PAPERWORK FROM [facility] ABOUT LFT HUMERUS FRACTURE  Phone Note Call from Patient Call back at [facility] Phone [phone]   Caller: Patient Summary of Call: PATIENT DROPPED OFF PAPERWORK FOR DR HOPPER TO REVIEW AND THEN ADD TO PATIENT'S CHART ABOUT HER "LEFT HUMERUS FRACTURE" FROM [facility]   WILL MAKE COPY AND SEND ORIG TO [facility] Initial call taken by: Jerolyn Shin,  October 26, 2008 11:40 AM  Follow-up for Phone Call        Placed on ledge for Dr.Hopper to review. Then to be scanned into patient's chart. Follow-up by: Shonna Chock,  October 26, 2008 12:18 PM

## 2011-01-28 NOTE — Miscellaneous (Signed)
Summary: Flu [facility] Pneumonia/[facility]  [facility] Pneumonia/[facility]   Imported By: Lanelle Bal 11/18/2010 12:55:08  _____________________________________________________________________  External Attachment:    Type:   Image     Comment:   External Document

## 2011-01-28 NOTE — Progress Notes (Signed)
Summary: ? Emphysema  Phone Note From Other Clinic Call back at [phone], Ext: [phone]   Caller: [employer] Summary of Call: Message left on Voicemail: Patient is unclear if she has a DX of Emphysema so Rosann Auerbach is calling on the patient's behalf. Please call to clarify this information.   I called and D/W with Heidi that after reviewing the patient's problem list, that is NOT one of her DX's, information was ok'd. Shonna Chock  October 15, 2009 3:40 PM

## 2011-01-28 NOTE — Progress Notes (Signed)
Summary: Xray  Phone Note Call from Patient Call back at Home Phone [phone]   Caller: Patient Summary of Call: Pt would like to be contacted once her xray results come in to let her know if she needs to make an appt with Dr. Alwyn Ren Wednesday. Pt can be reached at [phone]. Ok to leave message with husband Initial call taken by: Lavell Islam,  November 25, 2010 8:15 AM  Follow-up for Phone Call        Dr.Dayna Geurts please advise if patient needs to schedule a f/u with you based on recent xray results Follow-up by: Shonna Chock CMA,  November 25, 2010 1:19 PM  Additional Follow-up for Phone Call Additional follow up Details #1::        The Xray continues to  slowly improve. I recommend  a Pulmonary consult simply  to be cautious. 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 Lucious Groves CMA  November 26, 2010 8:21 AM   Patient notified and will come in to discuss tomorrow. Lucious Groves CMA  November 27, 2010 3:31 PM

## 2011-01-28 NOTE — Assessment & Plan Note (Signed)
Summary: 5 DAY FOLLOWUP///[facility]   Vital Signs:  Patient profile:   70 year old female Weight:      157 pounds BMI:     29.77 O2 Sat:      96 % Temp:     97.9 degrees F oral Pulse rate:   64 / minute Resp:     17 per minute BP sitting:   120 / 68  (left arm) Cuff size:   large  Vitals Entered By: Shonna Chock CMA (November 15, 2010 2:58 PM) CC: Follow-up visit: discuss xray (copy given), COPD follow-up   Primary Care Provider:  Laury Axon  CC:  Follow-up visit: discuss xray (copy given) and COPD follow-up.  History of Present Illness:      This is a 70 year old woman who presents for PNA follow-up.  The patient reports dry  cough, but denies shortness of breath, chest tightness, wheezing, increased sputum, and nocturnal awakening.   Serial Xrays reviewed : definite clearing , but residual RUL & RLL parenchymal changes.  Current Medications (verified): 1)  Lorazepam 1 Mg Tabs (Lorazepam) .... 1/2 Tab Q8hrs Prn 2)  Accolate 20 Mg Tabs (Zafirlukast) .... Once Daily 3)  Oscal 500/200 D-3  Tabs (Calcium-Vitamin D Tabs) .... 2 Tabs Daily 4)  Zyrtec 10 Mg Tabs (Cetirizine Hcl) .Marland Kitchen.. 1po Qd 5)  Nasonex 50 Mcg/act  Susp (Mometasone Furoate) .... Use 1 To 2 Sprays in Each Nostril Daily 6)  Allopurinol 300 Mg Tabs (Allopurinol) .Marland Kitchen.. 1 By Mouth Qd 7)  Baby Aspirin 81 Mg Chew (Aspirin) 8)  Albuterol Mdi 9)  Vit D1000iuqd .Marland Kitchen.. 1 By Mouth Qd 10)  Multivitamin 11)  Sertraline Hcl 100 Mg Tabs (Sertraline Hcl) .Marland Kitchen.. 1 By Mouth Daily 12)  Omeprazole 20 Mg Cpdr (Omeprazole) .Marland Kitchen.. 1 By Mouth Qd 13)  Mucinex Dm .Marland Kitchen.. 1 By Mouth Qd 14)  Tylenol As 2 Tabs Bid 15)  B-1 16)  Diovan 160 Mg  Tabs (Valsartan) .Marland Kitchen.. 1 Once Daily After Benicar Samples Completed 17)  Amlodipine Besylate 10 Mg Tabs (Amlodipine Besylate) .Marland Kitchen.. 1 By Mouth Once Daily 18)  Dulera 200-5 Mcg/act Aero (Mometasone Furo-Formoterol Fum) .Marland Kitchen.. 1-2 Puffs Every 12 Hrs ; Gargle & Spit After Use 19)  Singulair 10 Mg Tabs (Montelukast Sodium) .Marland Kitchen.. 1  Once Daily  Allergies: 1)  ! Sulfa 2)  ! Percodan 3)  ! Lotensin 4)  ! Vioxx 5)  ! * Diclofenac  Review of Systems General:  Denies fever and sweats; Minima chills. Resp:  Denies chest pain with inspiration and coughing up blood.  Physical Exam  General:  well-nourished,in no acute distress; alert,appropriate and cooperative throughout examination Lungs:  Normal respiratory effort, chest expands symmetrically. Lungs:  crackles  @ bases ; wheezes. Heart:  Normal rate and regular rhythm. S1 and S2 normal without gallop, murmur, click, rub .S4 Cervical Nodes:  No lymphadenopathy noted Axillary Nodes:  No palpable lymphadenopathy   Impression & Recommendations:  Problem # 1:  PNEUMONIA, BILATERAL (ICD-486)  slowly improving   Orders: Venipuncture (09811) T-2 View CXR (71020TC) Specimen Handling (91478)  Problem # 2:  HYPERGLYCEMIA, FASTING (ICD-790.29)  on steroids  Orders: Venipuncture (29562) Specimen Handling (13086)  Complete Medication List: 1)  Lorazepam 1 Mg Tabs (Lorazepam) .... 1/2 tab q8hrs prn 2)  Accolate 20 Mg Tabs (Zafirlukast) .... Once daily 3)  Oscal 500/200 D-3 Tabs (Calcium-vitamin d tabs) .... 2 tabs daily 4)  Zyrtec 10 Mg Tabs (Cetirizine hcl) .Marland Kitchen.. 1po qd 5)  Nasonex 50 Mcg/act  Susp (Mometasone furoate) .... Use 1 to 2 sprays in each nostril daily 6)  Allopurinol 300 Mg Tabs (Allopurinol) .Marland Kitchen.. 1 by mouth qd 7)  Baby Aspirin 81 Mg Chew (Aspirin) 8)  Albuterol Mdi  9)  Vit D1000iuqd  .Marland Kitchen.. 1 by mouth qd 10)  Multivitamin  11)  Sertraline Hcl 100 Mg Tabs (Sertraline hcl) .Marland Kitchen.. 1 by mouth daily 12)  Omeprazole 20 Mg Cpdr (Omeprazole) .Marland Kitchen.. 1 by mouth qd 13)  Mucinex Dm  .Marland KitchenMarland Kitchen. 1 by mouth qd 14)  Tylenol As 2 Tabs Bid  15)  B-1  16)  Diovan 160 Mg Tabs (Valsartan) .Marland Kitchen.. 1 once daily after benicar samples completed 17)  Amlodipine Besylate 10 Mg Tabs (Amlodipine besylate) .Marland Kitchen.. 1 by mouth once daily 18)  Dulera 200-5 Mcg/act Aero (Mometasone  furo-formoterol fum) .Marland Kitchen.. 1-2 puffs every 12 hrs ; gargle & spit after use 19)  Singulair 10 Mg Tabs (Montelukast sodium) .Marland Kitchen.. 1 once daily 20)  Prednisone (pak) 10 Mg Tabs (Prednisone) .... As per pack  Other Orders: Admin 1st Vaccine (86578) Flu Vaccine 60yrs + (46962)  Patient Instructions: 1)  Medically cleared to return to work; return to work note provided. May return 11/18/2010 , BUT must avoid freezer exposures due to recent pneumonia. Blow up 10 balloons / day. Repeat CXray 11/28 Prescriptions: PREDNISONE (PAK) 10 MG TABS (PREDNISONE) as per pack  #1 x 0   Entered and Authorized by:   Marga Melnick MD   Signed by:   Marga Melnick MD on 11/15/2010   Method used:   Faxed to ...       [facility]  [address] [phone]* (retail)       [address]       [place], [place]  [address]       Ph: [phone] or [phone]       Fax: [phone]   RxID:   [id number]    Orders Added: 1)  Est. Patient Level III [60109] 2)  Venipuncture [32355] 3)  Admin 1st Vaccine [90471] 4)  Flu Vaccine 37yrs + [90658] 5)  T-2 View CXR [71020TC] 6)  Specimen Handling [99000] Flu Vaccine Consent Questions     Do you have a history of severe allergic reactions to this vaccine? no    Any prior history of allergic reactions to egg and/or gelatin? no    Do you have a sensitivity to the preservative Thimersol? no    Do you have a past history of Guillan-Barre Syndrome? no    Do you currently have an acute febrile illness? no    Have you ever had a severe reaction to latex? no    Vaccine information given and explained to patient? yes    Are you currently pregnant? no    Lot Number:[id number]   Exp Date:06/27/2010   Site Given  Right Deltoid IMB-CBC Platelet - w/Differential [85025-CBCD] 4)  TLB-A1C / Hgb A1C (Glycohemoglobin) [83036-A1C] 5)  Admin 1st Vaccine [90471] 6)  Flu Vaccine 16yrs + [73220]    .lbflu

## 2011-01-28 NOTE — Letter (Signed)
Summary: Results Follow up Letter  [facility]  [address] [place], [place] [address]   Phone: [phone]  Fax: [phone]    11/10/2007 MRN: [id number]  [facility] [address] [place], [place]  [address]  Dear [name],  The following are the results of your recent test(s):  Test         Result    Pap Smear:        Normal _____  Not Normal _____ Comments: ______________________________________________________ Cholesterol: LDL(Bad cholesterol):         Your goal is less than:         HDL (Good cholesterol):       Your goal is more than: Comments:  ______________________________________________________ Mammogram:        Normal _____  Not Normal _____ Comments:  ___________________________________________________________________ Hemoccult:        Normal _____  Not normal _______ Comments:    _____________________________________________________________________ Other Tests:  Please see attached results and comments   We routinely do not discuss normal results over the telephone.  If you desire a copy of the results, or you have any questions about this information we can discuss them at your next office visit.   Sincerely,

## 2011-01-30 NOTE — Miscellaneous (Signed)
Summary: Orders Update pft charges  Clinical Lists Changes  Orders: Added new Service order of Carbon Monoxide diffusing w/capacity (94720) - Signed Added new Service order of Lung Volumes (94240) - Signed Added new Service order of Spirometry (Pre & Post) (94060) - Signed 

## 2011-01-30 NOTE — Assessment & Plan Note (Signed)
Summary: 1 month return/mhh   Copy to:  Dr. Marga Melnick Primary Provider/Referring Provider:  Laury Axon  CC:  patient here for follow-up...she says her breathing has improved and denies any cough or wheezing.  History of Present Illness: 70 yo female for evaluation of slow to resolve pneumonia with increased eosinophils on CBC.  She has been feeling better.  She is working full time.  She denies fever, cough, wheeze, sputum, sinus congestion, sore throat, skin rash, abdominal pain, hemoptysis, gland swelling, or leg swelling.  Current Medications (verified): 1)  Amlodipine Besylate 10 Mg Tabs (Amlodipine Besylate) .Marland Kitchen.. 1 By Mouth Once Daily 2)  Diovan 160 Mg  Tabs (Valsartan) .Marland Kitchen.. 1 Once Daily After Benicar Samples Completed 3)  Aspirin 81 Mg Tabs (Aspirin) .Marland Kitchen.. 1 By Mouth Daily 4)  Sertraline Hcl 100 Mg Tabs (Sertraline Hcl) .Marland Kitchen.. 1 By Mouth Daily 5)  Lorazepam 1 Mg Tabs (Lorazepam) .... 1/2 By Mouth Every 8 Hours As Needed 6)  Dulera 200-5 Mcg/act Aero (Mometasone Furo-Formoterol Fum) .Marland Kitchen.. 1-2 Puffs Every 12 Hrs ; Gargle & Spit After Use 7)  Singulair 10 Mg Tabs (Montelukast Sodium) .Marland Kitchen.. 1 Once Daily 8)  Proair Hfa 108 (90 Base) Mcg/act Aers (Albuterol Sulfate) .Marland Kitchen.. 1-2 Puffs Four Times Daily As Needed 9)  Nasonex 50 Mcg/act  Susp (Mometasone Furoate) .... Use 1 To 2 Sprays in Each Nostril Daily 10)  Accolate 20 Mg Tabs (Zafirlukast) .... Once Daily 11)  Zyrtec 10 Mg Tabs (Cetirizine Hcl) .Marland Kitchen.. 1 By Mouth Daily 12)  Omeprazole 20 Mg Cpdr (Omeprazole) .Marland Kitchen.. 1 By Mouth Daily 13)  Allopurinol 300 Mg Tabs (Allopurinol) .Marland Kitchen.. 1 By Mouth Qd 14)  Oscal 500/200 D-3  Tabs (Calcium-Vitamin D Tabs) .... 2 Tabs Daily 15)  Vitamin D 1000 Unit Tabs (Cholecalciferol) .Marland Kitchen.. 1 By Mouth Daily 16)  Vitamin B-1 100 Mg Tabs (Thiamine Hcl) .Marland Kitchen.. 1 By Mouth Daily As Needed in The Summer 17)  Multivitamins  Tabs (Multiple Vitamin) .Marland Kitchen.. 1 By Mouth Daily 18)  Mucinex Dm 30-600 Mg Xr12h-Tab  (Dextromethorphan-Guaifenesin) .Marland Kitchen.. 1 By Mouth Daily As Needed 19)  Tylenol Extra Strength 500 Mg Tabs (Acetaminophen) .... 2 By Mouth Two Times A Day As Needed  Allergies (verified): 1)  ! Sulfa 2)  ! Percodan 3)  ! Lotensin 4)  ! Vioxx 5)  ! 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 Asthma Hypertension Osteopenia (BMD  T score -1.6 @ L femoral neck 11/27/2009), S/P Fosamax X 5 years (?) GERD Depression Hyperlipidemia Anxiety Fractured shoulder 9/09,Dr Gean Birchwood   Past Surgical History: Reviewed history from 12/26/2009 and no changes required. TAH+ BSO for dysfunctional bleeding T&A Colonoscopy-2001(due 2011) Sinus surgery-2001 Appendectomy Esophageal dilation X1, Dr Juanda Chance  Vital Signs:  Patient profile:   70 year old female Height:      64 inches (162.56 cm) Weight:      163.13 pounds (74.15 kg) BMI:     28.10 O2 Sat:      92 % on Room air Temp:     98.3 degrees F (36.83 degrees C) oral Pulse rate:   78 / minute BP sitting:   138 / 72  (  left arm) Cuff size:   regular  Vitals Entered By: Michel Bickers CMA (January 09, 2011 10:04 AM)  O2 Sat at Rest %:  92 O2 Flow:  Room air CC: patient here for follow-up...she says her breathing has improved, denies any cough or wheezing Comments Medications reviewed with patient Michel Bickers CMA  January 09, 2011 10:14 AM   Physical Exam  General:  normal appearance and healthy appearing.   Nose:  no deformity, discharge, inflammation, or lesions Mouth:  no deformity or  lesions Neck:  no masses, thyromegaly, or abnormal cervical nodes Lungs:  clear bilaterally to auscultation and percussion Heart:  regular rate and rhythm, S1, S2 without murmurs, rubs, gallops, or clicks Extremities:  no clubbing, cyanosis, edema, or deformity noted Neurologic:  normal CN II-XII and strength normal.   Cervical Nodes:  no significant adenopathy Psych:  alert and cooperative; normal mood and affect; normal attention span and concentration   Impression & Recommendations:  Problem # 1:  PNEUMONIA, BILATERAL (ICD-486) She has improved clinically and by physical exam.  I will repeat her chest xray today to document clearing of her infiltrate.  If her chest xray is improved, then she can follow up with pulmonary as needed.  Will calll her with results of chest xray.  Other Orders: Est. Patient Level III (21308) T-2 View CXR (71020TC)  Patient Instructions: 1)  Chest xray today; will call you with results 2)  Follow up as needed

## 2011-01-30 NOTE — Progress Notes (Signed)
Summary: Refill Request  Phone Note Refill Request Call back at [phone] Message from:  Pharmacy on January 13, 2011 8:00 AM  Refills Requested: Medication #1:  OMEPRAZOLE 20 MG CPDR 1 by mouth daily   Dosage confirmed as above?Dosage Confirmed   Supply Requested: 1 year   Last Refilled: 12/03/2010 [facility] [address]  Next Appointment Scheduled: none Initial call taken by: Harold Barban,  January 13, 2011 8:01 AM    Prescriptions: OMEPRAZOLE 20 MG CPDR (OMEPRAZOLE) 1 by mouth daily  #30 x 5   Entered by:   Shonna Chock CMA   Authorized by:   Marga Melnick MD   Signed by:   Shonna Chock CMA on 01/13/2011   Method used:   Electronically to        [facility] (retail)       [address]       [place], [place]  [address]       Ph: [phone]       Fax: [phone]   RxID:   [id number]

## 2011-01-30 NOTE — Progress Notes (Signed)
  Phone Note Outgoing Call   Call placed by: [name] MD,  January 23, 2011 3:07 PM Call placed to: Patient Summary of Call: Discussed bronchoscopy results with patient.  Findings consistent with eosinophilic pneumonia.  Will start prednisone 40 mg once daily.  She is scheduled for follow up on Feb 8. Initial call taken by: [name] MD,  January 23, 2011 3:07 PM    New/Updated Medications: PREDNISONE 20 MG TABS (PREDNISONE) two by mouth once daily Prescriptions: PREDNISONE 20 MG TABS (PREDNISONE) two by mouth once daily  #60 x 2   Entered and Authorized by:   [name] MD   Signed by:   [name] MD on 01/23/2011   Method used:   Electronically to        [facility] (retail)       [address]       [place], [place]  [address]       Ph: [phone]       Fax: [phone]   RxID:   [id number]

## 2011-01-30 NOTE — Progress Notes (Signed)
Summary: refill  Phone Note Refill Request Message from:  Fax from Pharmacy on December 18, 2010 1:52 PM  Refills Requested: Medication #1:  DIOVAN 160 MG  TABS 1 once daily AFTER Benicar samples completed [facility] - fax [phone]  Initial call taken by: Okey Regal [place],  December 18, 2010 1:53 PM    Prescriptions: DIOVAN 160 MG  TABS (VALSARTAN) 1 once daily AFTER Benicar samples completed  #90 x 1   Entered by:   Shonna Chock CMA   Authorized by:   Marga Melnick MD   Signed by:   Shonna Chock CMA on 12/18/2010   Method used:   Electronically to        [facility] (retail)       [address]       [place], [place]  [address]       Ph: [phone]       Fax: [phone]   RxID:   [id number]

## 2011-01-30 NOTE — Assessment & Plan Note (Signed)
Summary: pulm eval/jd   Visit Type:  Initial Consult Copy to:  Dr. Marga Melnick Primary Provider/Referring Provider:  Laury Axon  CC:  Pulmonary consult...PFT done today.  History of Present Illness: 70 yo female for evaluation of slow to resolve pneumonia with increased eosinophils on CBC.  She developed respiratory symptoms at the beginning of Nov 2011.  Her husband had a cold, and she caught something from him.  She had shortness of breath, wheeze, and dry cough.  She would also hear some crackle noises with her breathing.  She denies skin rash, fever, hemoptysis, sinus congestion, sore throat.  She was feeling fatigued.  She has a history of allergies and asthma.  She was changed from advair to [place].  She had a chest xray from Nov 9 which showed right > left infiltrates.  She was treated with zithromax and prednisone.  She was also given a flu and pneumonia vaccine.  She had follow up chest xrays on Nov 14, 18, and 28 which showed progressive improvement in her infiltrates.  She was given an additional course of prednisone on Nov 18 for persistent shortness of breath and wheeze.  She is now off prednisone and antibiotics.  She had lab testing on Dec 1 which showed an increase eosinophil count in her peripheral smear.  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  As a result pulmonary consultation was requested.  Prior to this past November she never had pneumonia before.  She denies any history of TB.  She used to have terrible allergies, and was getting allergies shots before with Dr. Stevphen Rochester.  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  She no longer works there, and her allergies hve improved.  She denies any recent animal exposures.  She currently works at [employer].  She is from [place] originally, and denies any recent travel.  She smoked 1 pack per day for 15 years and quit 30 years  ago.  Much of her previous symptoms have improved.  She has returned to work.  She still feels fatigued.  She is not having as much cough.  She is not having fever, sputum, skin rash, sinus congestion, or wheeze.   Date               WBC              Eos (absolute)           Eos% 11/04/07         5.9                     0.7                          12.2 09/29/08         6.4                     0.7                          10.2 05/04/09         5.5                     0.4  7.4 12/26/09         7.7                     0.7                          8.8 11/15/10         10.8                   0.2                          2 11/28/10         7.7                     1.1                          13.7   Immunofixation electrophoresis 11/28/10: Total Protein           7.2 g/dL                      5.7-8.4   IgG                       1010 mg/dL                 [id number]   IgA                       156 mg/dL                   84-132   IgM                       257 mg/dL                   44-010  Pulmonary function testing 12/11/10: FEV1 1.70(82%), FEV1% 68, TLC 4.01(83%), DLCO 67%, no bronchodilator response.  Mild obstruction, normal lung volumes, mild diffusion defect.  CXR  Procedure date:  11/06/2010  Findings:       CHEST - 2 VIEW    Comparison: None.    Findings: Heart size and mediastinal contours are within normal   limits. There is extensive patchy airspace disease in the right   upper lobe and right lower lobe, with dense consolidation in the   right lower lobe.  There is a patchy opacity in the lingula.  The   remainder of the left lung is clear.  No definite pleural effusion.   Negative for pneumothorax.    IMPRESSION:   Extensive multifocal pneumonia in the right lung and pneumonia   verses atelectasis in the lingula.    CXR  Procedure date:  11/11/2010  Findings:      CHEST - 2 VIEW    Comparison: 11/06/2010    Findings: The cardiomediastinal  silhouette is stable.   Focal airspace disease/opacity within the right upper lobe and   bilateral lower lobes are improved.   No new airspace disease, pleural effusion or pneumothorax noted.   Mild left basilar atelectasis/scarring again noted.    No acute bony abnormalities are present.    IMPRESSION:   Improving multifocal pneumonia.  CXR  Procedure date:  11/15/2010  Findings:      CHEST - 2 VIEW  Comparison: 11/06/2010    Findings: Midline trachea.  Mild cardiomegaly.  Cannot exclude   right suprahilar soft tissue fullness versus central right upper   lobe airspace disease.    No pleural effusion or pneumothorax.  Minimal improvement in right   upper lobe and bibasilar airspace disease.  No new findings.  Mild   right hemidiaphragm elevation.    IMPRESSION:    1.  Improved but persistent bilateral airspace disease.  Recommend   radiographic follow-up until clearing.   2.  Cannot exclude right suprahilar soft tissue fullness versus   central right upper lobe pulmonary opacity.  CXR  Procedure date:  11/25/2010  Findings:      CHEST - 2 VIEW    Comparison: 11/15/2010 and 11/06/2010    Findings: Trachea is midline.  Heart size stable.  There has been   marked interval improvement in right upper lobe air space   consolidation, when compared with 11/06/2010.  Bibasilar air space   disease has improved as well, without complete resolution.  No   pleural fluid.    IMPRESSION:   Improving multilobar pneumonia, when compared baseline exam of   11/06/2010.   Preventive Screening-Counseling & Management  Alcohol-Tobacco     Alcohol drinks/day: 0     Smoking Status: quit     Packs/Day: 1.0     Year Started: 1965     Year Quit: 1980  Current Medications (verified): 1)  Lorazepam 1 Mg Tabs (Lorazepam) .... 1/2 By Mouth Every 8 Hours As Needed 2)  Accolate 20 Mg Tabs (Zafirlukast) .... Once Daily 3)  Oscal 500/200 D-3  Tabs (Calcium-Vitamin D Tabs) .... 2  Tabs Daily 4)  Zyrtec 10 Mg Tabs (Cetirizine Hcl) .Marland Kitchen.. 1 By Mouth Daily 5)  Nasonex 50 Mcg/act  Susp (Mometasone Furoate) .... Use 1 To 2 Sprays in Each Nostril Daily 6)  Allopurinol 300 Mg Tabs (Allopurinol) .Marland Kitchen.. 1 By Mouth Qd 7)  Aspirin 81 Mg Tabs (Aspirin) .Marland Kitchen.. 1 By Mouth Daily 8)  Proair Hfa 108 (90 Base) Mcg/act Aers (Albuterol Sulfate) .Marland Kitchen.. 1-2 Puffs Four Times Daily As Needed 9)  Vitamin D 1000 Unit Tabs (Cholecalciferol) .Marland Kitchen.. 1 By Mouth Daily 10)  Multivitamins  Tabs (Multiple Vitamin) .Marland Kitchen.. 1 By Mouth Daily 11)  Sertraline Hcl 100 Mg Tabs (Sertraline Hcl) .Marland Kitchen.. 1 By Mouth Daily 12)  Omeprazole 20 Mg Cpdr (Omeprazole) .Marland Kitchen.. 1 By Mouth Daily 13)  Mucinex Dm 30-600 Mg Xr12h-Tab (Dextromethorphan-Guaifenesin) .Marland Kitchen.. 1 By Mouth Daily As Needed 14)  Tylenol Extra Strength 500 Mg Tabs (Acetaminophen) .... 2 By Mouth Two Times A Day As Needed 15)  Vitamin B-1 100 Mg Tabs (Thiamine Hcl) .Marland Kitchen.. 1 By Mouth Daily As Needed in The Summer 16)  Diovan 160 Mg  Tabs (Valsartan) .Marland Kitchen.. 1 Once Daily After Benicar Samples Completed 17)  Amlodipine Besylate 10 Mg Tabs (Amlodipine Besylate) .Marland Kitchen.. 1 By Mouth Once Daily 18)  Dulera 200-5 Mcg/act Aero (Mometasone Furo-Formoterol Fum) .Marland Kitchen.. 1-2 Puffs Every 12 Hrs ; Gargle & Spit After Use 19)  Singulair 10 Mg Tabs (Montelukast Sodium) .Marland Kitchen.. 1 Once Daily  Allergies (verified): 1)  ! Sulfa 2)  ! Percodan 3)  ! Lotensin 4)  ! Vioxx 5)  ! * Diclofenac  Past History:  Past Medical History: Reviewed history from 01/01/2010 and no changes required. Asthma Hypertension Osteopenia (BMD  T score -1.6 @ L femoral neck 11/27/2009), S/P Fosamax X 5 years (?) GERD Depression Hyperlipidemia Anxiety Fractured shoulder 9/09,Dr Gean Birchwood  Past Surgical History: Reviewed history from 12/26/2009 and no changes required. TAH+ BSO for dysfunctional bleeding T&A Colonoscopy-2001(due 2011) Sinus surgery-2001 Appendectomy Esophageal dilation X1, Dr Juanda Chance  Family  History: Reviewed history from 12/26/2009 and no changes required. Father: arthritis,RA,PTE, HTN Mother: HTN, Altzheimer's Siblings: bro HTN, DM; bro  NHL,esophageal stricture,HTN daughter IgA deficiency; son laryngeal cancer (sarcoma)  Social History: Reviewed history from 12/26/2009 and no changes required. Former Smoker: quit 1970 Alcohol use-yes : 2-4 glasses/ day No diet Occupation:Grocery Store Employee Regular exercise-no Alcohol drinks/day:  0 Packs/Day:  1.0  Review of Systems       The patient complains of shortness of breath with activity, non-productive cough, chest pain, hand/feet swelling, and joint stiffness or pain.  The patient denies shortness of breath at rest, productive cough, coughing up blood, irregular heartbeats, acid heartburn, indigestion, loss of appetite, weight change, abdominal pain, difficulty swallowing, sore throat, tooth/dental problems, headaches, nasal congestion/difficulty breathing through nose, sneezing, itching, ear ache, anxiety, depression, rash, change in color of mucus, and fever.    Vital Signs:  Patient profile:   70 year old female Height:      64 inches (162.56 cm) Weight:      168 pounds (76.36 kg) BMI:     28.94 O2 Sat:      95 % on Room air Temp:     97.7 degrees F (36.50 degrees C) oral Pulse rate:   76 / minute BP sitting:   118 / 78  (left arm) Cuff size:   regular  Vitals Entered By: Michel Bickers CMA (December 11, 2010 11:06 AM)  O2 Sat at Rest %:  95 O2 Flow:  Room air CC: Pulmonary consult...PFT done today Is Patient Diabetic? Yes Comments Medications reviewed with patient Michel Bickers [facility]  December 11, 2010 11:20 AM   Physical Exam  General:  normal appearance and healthy appearing.   Eyes:  PERRLA and EOMI.   Ears:  TMs intact and clear with normal canals Nose:  no deformity, discharge, inflammation, or lesions Mouth:  no deformity or lesions Neck:  no masses, thyromegaly, or abnormal cervical nodes Chest  Wall:  no deformities noted Lungs:  clear bilaterally to auscultation and percussion Heart:  regular rate and rhythm, S1, S2 without murmurs, rubs, gallops, or clicks Abdomen:  bowel sounds positive; abdomen soft and non-tender without masses, or organomegaly Msk:  no deformity or scoliosis noted with normal posture Pulses:  pulses normal Extremities:  no clubbing, cyanosis, edema, or deformity noted Neurologic:  normal CN II-XII and strength normal.   Skin:  intact without lesions or rashes Cervical Nodes:  no significant adenopathy Psych:  alert and cooperative; normal mood and affect; normal attention span and concentration   Impression & Recommendations:  Problem # 1:  PNEUMONIA, BILATERAL (ICD-486) She developed pneumonia on chest xray from Nov 9.  She has clinically improved with antibiotic and prednisone therapy.  She was found to have increase in eosinophil count on peripheral smear from CBC on Dec 1.  Given her continued clinically improvement even off prednisone I am not sure how much to make off the slight increase in eosinophil count.  Additionally, eosinophilic pneumonias are more commonly associated with restrictive defect on PFT, which was not seen for [name].  To further assess this I will repeat her chest xray today.  I will also repeat her lab work, including CBC with peripheral smear to determine if she has persistent elevation in her eosinophil count.  If she does,  would then need to arrange for CT chest and also consider bronchoscopy.  I have advised her that, given her current status, there is no pulmonary contra-indication to her continuing her work schedule.  Problem # 2:  ASTHMA (ICD-493.90) This has improved with recent prednisone therapy.  She now appears stable on her current regimen.  Will defer further management of this to primary care.  Medications Added to Medication List This Visit: 1)  Aspirin 81 Mg Tabs (Aspirin) .Marland Kitchen.. 1 by mouth daily 2)   Lorazepam 1 Mg Tabs (Lorazepam) .... 1/2 by mouth every 8 hours as needed 3)  Proair Hfa 108 (90 Base) Mcg/act Aers (Albuterol sulfate) .Marland Kitchen.. 1-2 puffs four times daily as needed 4)  Zyrtec 10 Mg Tabs (Cetirizine hcl) .Marland Kitchen.. 1 by mouth daily 5)  Omeprazole 20 Mg Cpdr (Omeprazole) .Marland Kitchen.. 1 by mouth daily 6)  Vitamin D 1000 Unit Tabs (Cholecalciferol) .Marland Kitchen.. 1 by mouth daily 7)  Vitamin B-1 100 Mg Tabs (Thiamine hcl) .Marland Kitchen.. 1 by mouth daily as needed in the summer 8)  Multivitamins Tabs (Multiple vitamin) .Marland Kitchen.. 1 by mouth daily 9)  Mucinex Dm 30-600 Mg Xr12h-tab (Dextromethorphan-guaifenesin) .Marland Kitchen.. 1 by mouth daily as needed 10)  Tylenol Extra Strength 500 Mg Tabs (Acetaminophen) .... 2 by mouth two times a day as needed  Complete Medication List: 1)  Amlodipine Besylate 10 Mg Tabs (Amlodipine besylate) .Marland Kitchen.. 1 by mouth once daily 2)  Diovan 160 Mg Tabs (Valsartan) .Marland Kitchen.. 1 once daily after benicar samples completed 3)  Aspirin 81 Mg Tabs (Aspirin) .Marland Kitchen.. 1 by mouth daily 4)  Sertraline Hcl 100 Mg Tabs (Sertraline hcl) .Marland Kitchen.. 1 by mouth daily 5)  Lorazepam 1 Mg Tabs (Lorazepam) .... 1/2 by mouth every 8 hours as needed 6)  Dulera 200-5 Mcg/act Aero (Mometasone furo-formoterol fum) .Marland Kitchen.. 1-2 puffs every 12 hrs ; gargle & spit after use 7)  Singulair 10 Mg Tabs (Montelukast sodium) .Marland Kitchen.. 1 once daily 8)  Proair Hfa 108 (90 Base) Mcg/act Aers (Albuterol sulfate) .Marland Kitchen.. 1-2 puffs four times daily as needed 9)  Nasonex 50 Mcg/act Susp (Mometasone furoate) .... Use 1 to 2 sprays in each nostril daily 10)  Accolate 20 Mg Tabs (Zafirlukast) .... Once daily 11)  Zyrtec 10 Mg Tabs (Cetirizine hcl) .Marland Kitchen.. 1 by mouth daily 12)  Omeprazole 20 Mg Cpdr (Omeprazole) .Marland Kitchen.. 1 by mouth daily 13)  Allopurinol 300 Mg Tabs (Allopurinol) .Marland Kitchen.. 1 by mouth qd 14)  Oscal 500/200 D-3 Tabs (Calcium-vitamin d tabs) .... 2 tabs daily 15)  Vitamin D 1000 Unit Tabs (Cholecalciferol) .Marland Kitchen.. 1 by mouth daily 16)  Vitamin B-1 100 Mg Tabs (Thiamine hcl)  .Marland Kitchen.. 1 by mouth daily as needed in the summer 17)  Multivitamins Tabs (Multiple vitamin) .Marland Kitchen.. 1 by mouth daily 18)  Mucinex Dm 30-600 Mg Xr12h-tab (Dextromethorphan-guaifenesin) .Marland Kitchen.. 1 by mouth daily as needed 19)  Tylenol Extra Strength 500 Mg Tabs (Acetaminophen) .... 2 by mouth two times a day as needed  Other Orders: Consultation Level IV (99244) T-2 View CXR (71020TC) TLB-CBC Platelet - w/Differential (85025-CBCD) TLB-BMP (Basic Metabolic Panel-BMET) (80048-METABOL) TLB-Hepatic/Liver Function Pnl (80076-HEPATIC) TLB-Sedimentation Rate (ESR) (85652-ESR)  Patient Instructions: 1)  Lab test today 2)  Chest xray today 3)  Follow up in 4 weeks

## 2011-01-30 NOTE — Medication Information (Signed)
Summary: Noncompliance with Dulera/[employer]  Noncompliance with Dulera/[employer]   Imported By: Lanelle Bal 12/25/2010 08:01:08  _____________________________________________________________________  External Attachment:    Type:   Image     Comment:   External Document

## 2011-01-30 NOTE — Progress Notes (Signed)
Summary: states dr wanted her schedule so he could call her  Phone Note Call from Patient   Caller: Patient Call For: Dr. Craige Cotta Summary of Call: Patient stated that she had a procedure this morning and she told him to call our office back this afternoon and let him know what her schedule is so he can call her.  Her schedule is Wed she is working from 8:30 to 4:30.  She is off on Thursday.  Friday she is working from 3:00pm.  Monday she is working from 2:00 to 8:30pm.  Tuesday she is working from 2:00 to 8:30pm. Patient can be reached at [phone]. She leaves 30 minutes before she has to be at work and arrives about 45 minutes after she gets off. Initial call taken by: Vedia Coffer,  January 21, 2011 4:24 PM  Follow-up for Phone Call        called and spoke with pt.  pt states she had bronch this morning with VS and was told to call back to give her work schedule so that VS knew when to call pt at home to discuss bronch results.  Will forward message to VS.  Arman Filter LPN  January 21, 2011 5:00 PM

## 2011-01-30 NOTE — Miscellaneous (Signed)
Summary: Pulmonary function test   Pulmonary Function Test Date: 12/11/2010 Height (in.): 64 Gender: Female  Pre-Spirometry FVC    Value: 2.49 L/min   Pred: 2.90 L/min     % Pred: 86 % FEV1    Value: 1.70 L     Pred: 2.09 L     % Pred: 82 % FEV1/FVC  Value: 68 %     Pred: 72 %     % Pred: . % FEF 25-75  Value: 0.99 L/min   Pred: 2.38 L/min     % Pred: 41 %  Post-Spirometry FVC    Value: 2.49 L/min   Pred: 2.90 L/min     % Pred: 86 % FEV1    Value: 1.78 L     Pred: 2.09 L     % Pred: 85 % FEV1/FVC  Value: 71 %     Pred: 72 %     % Pred: . % FEF 25-75  Value: 1.20 L/min   Pred: 2.38 L/min     % Pred: 50 %  Lung Volumes TLC    Value: 4.01 L   % Pred: 83 % RV    Value: 1.52 L   % Pred: 80 % DLCO    Value: 14.1 %   % Pred: 67 % DLCO/VA  Value: 3.74 %   % Pred: 103 %  Comments: Mild obstruction.  No bronchodilator response.  Normal lung volumes.  Mild diffusion defect corrects for lung volumes.  Clinical Lists Changes  Observations: Added new observation of PFT COMMENTS: Mild obstruction.  No bronchodilator response.  Normal lung volumes.  Mild diffusion defect corrects for lung volumes. (12/11/2010 8:48) Added new observation of DLCO/VA%EXP: 103 % (12/11/2010 8:48) Added new observation of DLCO/VA: 3.74 % (12/11/2010 8:48) Added new observation of DLCO % EXPEC: 67 % (12/11/2010 8:48) Added new observation of DLCO: 14.1 % (12/11/2010 8:48) Added new observation of RV % EXPECT: 80 % (12/11/2010 8:48) Added new observation of RV: 1.52 L (12/11/2010 8:48) Added new observation of TLC % EXPECT: 83 % (12/11/2010 8:48) Added new observation of TLC: 4.01 L (12/11/2010 8:48) Added new observation of FEF2575%EXPS: 50 % (12/11/2010 8:48) Added new observation of PSTFEF25/75P: 2.38  (12/11/2010 8:48) Added new observation of PSTFEF25/75%: 1.20 L/min (12/11/2010 8:48) Added new observation of PSTFEV1/FCV%: . % (12/11/2010 8:48) Added new observation of FEV1FVCPRDPS: 72 % (12/11/2010  8:48) Added new observation of PSTFEV1/FVC: 71 % (12/11/2010 8:48) Added new observation of POSTFEV1%PRD: 85 % (12/11/2010 8:48) Added new observation of FEV1PRDPST: 2.09 L (12/11/2010 8:48) Added new observation of POST FEV1: 1.78 L/min (12/11/2010 8:48) Added new observation of POST FVC%EXP: 86 % (12/11/2010 8:48) Added new observation of FVCPRDPST: 2.90 L/min (12/11/2010 8:48) Added new observation of POST FVC: 2.49 L (12/11/2010 8:48) Added new observation of FEF % EXPEC: 41 % (12/11/2010 8:48) Added new observation of FEF25-75%PRE: 2.38 L/min (12/11/2010 8:48) Added new observation of FEF 25-75%: 0.99 L/min (12/11/2010 8:48) Added new observation of FEV1/FVC%EXP: . % (12/11/2010 8:48) Added new observation of FEV1/FVC PRE: 72 % (12/11/2010 8:48) Added new observation of FEV1/FVC: 68 % (12/11/2010 8:48) Added new observation of FEV1 % EXP: 82 % (12/11/2010 8:48) Added new observation of FEV1 PREDICT: 2.09 L (12/11/2010 8:48) Added new observation of FEV1: 1.70 L (12/11/2010 8:48) Added new observation of FVC % EXPECT: 86 % (12/11/2010 8:48) Added new observation of FVC PREDICT: 2.90 L (12/11/2010 8:48) Added new observation of FVC: 2.49 L (12/11/2010 8:48) Added new observation of PFT  HEIGHT: 64  (12/11/2010 8:48) Added new observation of PFT DATE: 12/11/2010  (12/11/2010 8:48)

## 2011-01-31 ENCOUNTER — Telehealth (INDEPENDENT_AMBULATORY_CARE_PROVIDER_SITE_OTHER): Payer: Self-pay | Admitting: *Deleted

## 2011-02-05 ENCOUNTER — Encounter: Payer: Self-pay | Admitting: Pulmonary Disease

## 2011-02-05 ENCOUNTER — Other Ambulatory Visit: Payer: Managed Care, Other (non HMO)

## 2011-02-05 ENCOUNTER — Ambulatory Visit (INDEPENDENT_AMBULATORY_CARE_PROVIDER_SITE_OTHER): Payer: Managed Care, Other (non HMO) | Admitting: Pulmonary Disease

## 2011-02-05 ENCOUNTER — Encounter (INDEPENDENT_AMBULATORY_CARE_PROVIDER_SITE_OTHER): Payer: Self-pay | Admitting: *Deleted

## 2011-02-05 ENCOUNTER — Other Ambulatory Visit: Payer: Self-pay | Admitting: Pulmonary Disease

## 2011-02-05 DIAGNOSIS — J8289 Other pulmonary eosinophilia, not elsewhere classified: Secondary | ICD-10-CM

## 2011-02-05 DIAGNOSIS — J82 Pulmonary eosinophilia, not elsewhere classified: Secondary | ICD-10-CM

## 2011-02-05 LAB — HEPATIC FUNCTION PANEL
ALT: 37 U/L — ABNORMAL HIGH (ref 0–35)
AST: 33 U/L (ref 0–37)
Albumin: 4.1 g/dL (ref 3.5–5.2)
Alkaline Phosphatase: 51 U/L (ref 39–117)
Bilirubin, Direct: 0.1 mg/dL (ref 0.0–0.3)
Total Bilirubin: 0.3 mg/dL (ref 0.3–1.2)
Total Protein: 7.4 g/dL (ref 6.0–8.3)

## 2011-02-05 LAB — CBC WITH DIFFERENTIAL/PLATELET
Basophils Absolute: 0 10*3/uL (ref 0.0–0.1)
Basophils Relative: 0.1 % (ref 0.0–3.0)
Eosinophils Absolute: 0 10*3/uL (ref 0.0–0.7)
Eosinophils Relative: 0 % (ref 0.0–5.0)
HCT: 40.3 % (ref 36.0–46.0)
Hemoglobin: 13.4 g/dL (ref 12.0–15.0)
Lymphocytes Relative: 6.9 % — ABNORMAL LOW (ref 12.0–46.0)
Lymphs Abs: 0.6 10*3/uL — ABNORMAL LOW (ref 0.7–4.0)
MCHC: 33.3 g/dL (ref 30.0–36.0)
MCV: 92.3 fl (ref 78.0–100.0)
Monocytes Absolute: 0.1 10*3/uL (ref 0.1–1.0)
Monocytes Relative: 1.4 % — ABNORMAL LOW (ref 3.0–12.0)
Neutro Abs: 7.6 10*3/uL (ref 1.4–7.7)
Neutrophils Relative %: 91.6 % — ABNORMAL HIGH (ref 43.0–77.0)
Platelets: 311 10*3/uL (ref 150.0–400.0)
RBC: 4.36 Mil/uL (ref 3.87–5.11)
RDW: 17 % — ABNORMAL HIGH (ref 11.5–14.6)
WBC: 8.3 10*3/uL (ref 4.5–10.5)

## 2011-02-05 LAB — BASIC METABOLIC PANEL
BUN: 21 mg/dL (ref 6–23)
CO2: 31 mEq/L (ref 19–32)
Calcium: 9.4 mg/dL (ref 8.4–10.5)
Chloride: 99 mEq/L (ref 96–112)
Creatinine, Ser: 0.8 mg/dL (ref 0.4–1.2)
GFR: 71.71 mL/min (ref >60.00)
Glucose, Bld: 136 mg/dL — ABNORMAL HIGH (ref 70–99)
Potassium: 4.5 mEq/L (ref 3.5–5.1)
Sodium: 137 mEq/L (ref 135–145)

## 2011-02-05 LAB — CONVERTED CEMR LAB: IgE (Immunoglobulin E), Serum: 282.1 intl units/mL — ABNORMAL HIGH (ref 0.0–180.0)

## 2011-02-05 NOTE — Progress Notes (Signed)
Summary: Nasonex refill  Phone Note Refill Request Message from:  Fax from Pharmacy on January 31, 2011 11:03 AM  Refills Requested: Medication #1:  NASONEX 50 MCG/ACT  SUSP Use 1 to 2 sprays in each nostril daily [id number], [address] [place], [place], [place]  phone - [phone],  fax - [phone]    qty = 17  Next Appointment Scheduled: none Initial call taken by: Jerolyn Shin,  January 31, 2011 11:03 AM    Prescriptions: NASONEX 50 MCG/ACT  SUSP (MOMETASONE FUROATE) Use 1 to 2 sprays in each nostril daily  #17 Gram x 5   Entered by:   Shonna Chock CMA   Authorized by:   Marga Melnick MD   Signed by:   Shonna Chock CMA on 01/31/2011   Method used:   Electronically to        [facility] (retail)       [address]       [place], [place]  [address]       Ph: [phone]       Fax: [phone]   RxID:   [id number]

## 2011-02-06 ENCOUNTER — Other Ambulatory Visit: Payer: Self-pay | Admitting: Pulmonary Disease

## 2011-02-06 ENCOUNTER — Ambulatory Visit (INDEPENDENT_AMBULATORY_CARE_PROVIDER_SITE_OTHER)
Admission: RE | Admit: 2011-02-06 | Discharge: 2011-02-06 | Disposition: A | Discharge status: Home / Self Care | Payer: Managed Care, Other (non HMO) | Source: Ambulatory Visit | Attending: Pulmonary Disease | Admitting: Pulmonary Disease

## 2011-02-06 DIAGNOSIS — J8289 Other pulmonary eosinophilia, not elsewhere classified: Secondary | ICD-10-CM

## 2011-02-13 NOTE — Op Note (Signed)
Summary: Bronchoscopy /  Lisa Larson  Bronchoscopy /  Lisa Larson   Imported By: Lennie Odor 02/05/2011 14:55:30  _____________________________________________________________________  External Attachment:    Type:   Image     Comment:   External Document

## 2011-02-13 NOTE — Assessment & Plan Note (Signed)
Summary: 3 wk follow-up//ms   Copy to:  Dr. Marga Melnick Primary Provider/Referring Provider:  Laury Axon  CC:  3 week follow up. Pt states her breathing has been "okay". Pt c/o dry cough. Pt states she still gets sob w/ exertions.  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  She is not having sinus congestions, sore throat, cough, wheeze, sputum, hemoptysis, fever, sweats, skin rash, abdominal pain, diarrhea, or gland swelling.  She is back to work on a regular schedule w/o difficulty.   Current Medications (verified): 1)  Amlodipine Besylate 10 Mg Tabs (Amlodipine Besylate) .Marland Kitchen.. 1 By Mouth Once Daily 2)  Diovan 160 Mg  Tabs (Valsartan) .Marland Kitchen.. 1 Once Daily After Benicar Samples Completed 3)  Sertraline Hcl 100 Mg Tabs (Sertraline Hcl) .Marland Kitchen.. 1 By Mouth Daily 4)  Lorazepam 1 Mg Tabs (Lorazepam) .... 1/2 By Mouth Every 8 Hours As Needed 5)  Dulera 200-5 Mcg/act Aero (Mometasone Furo-Formoterol Fum) .Marland Kitchen.. 1-2 Puffs Every 12 Hrs ; Gargle & Spit After Use 6)  Proair Hfa 108 (90 Base) Mcg/act Aers (Albuterol Sulfate) .Marland Kitchen.. 1-2 Puffs Four Times Daily As Needed 7)  Nasonex 50 Mcg/act  Susp (Mometasone Furoate) .... Use 1 To 2 Sprays in Each Nostril Daily 8)  Zyrtec 10 Mg Tabs (Cetirizine Hcl) .Marland Kitchen.. 1 By Mouth Daily 9)  Omeprazole 20 Mg Cpdr (Omeprazole) .Marland Kitchen.. 1 By Mouth Daily 10)  Allopurinol 300 Mg Tabs (Allopurinol) .Marland Kitchen.. 1 By Mouth Qd 11)  Oscal 500/200 D-3  Tabs (Calcium-Vitamin D Tabs) .... 2 Tabs Daily 12)  Vitamin D 1000 Unit Tabs (Cholecalciferol) .Marland Kitchen.. 1 By Mouth Daily 13)  Vitamin B-1 100 Mg Tabs (Thiamine Hcl) .Marland Kitchen.. 1 By Mouth Daily As Needed in The Summer 14)  Multivitamins  Tabs (Multiple Vitamin) .Marland Kitchen.. 1 By Mouth Daily 15)  Mucinex Dm 30-600 Mg Xr12h-Tab (Dextromethorphan-Guaifenesin) .Marland Kitchen.. 1 By Mouth Daily As Needed 16)  Tylenol Extra Strength 500 Mg Tabs (Acetaminophen) .... 2 By Mouth Two Times A Day As  Needed 17)  Prednisone 20 Mg Tabs (Prednisone) .... Two By Mouth Once Daily  Allergies (verified): 1)  ! Sulfa 2)  ! Percodan 3)  ! Lotensin 4)  ! Vioxx 5)  ! * Diclofenac  Past History:  Past Medical History: Reviewed history from 01/23/2011 and no changes required. 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 TAH+ BSO for dysfunctional bleeding T&A Colonoscopy-2001(due 2011) Sinus surgery-2001 Appendectomy Esophageal dilation X1, Dr Juanda Chance Bronchoscopy Jan. 2012, Dr. Craige Cotta  Vital Signs:  Patient profile:   70 year old female Height:      64 inches Weight:      167.50 pounds BMI:     28.86 O2 Sat:      97 % on Room air Temp:     97.9 degrees F oral Pulse rate:   69 / minute BP sitting:   138 / 74  (left arm) Cuff size:   regular  Vitals Entered By: Carver Fila (February 05, 2011 4:20 PM)  O2 Flow:  Room air CC: 3 week follow up. Pt states her breathing  has been "okay". Pt c/o dry cough. Pt states she still gets sob w/ exertions Comments meds and allergies updated Phone number updated Carver Fila  February 05, 2011 4:21 PM    Physical Exam  General:  normal appearance and healthy appearing.   Nose:  no deformity, discharge, inflammation, or lesions Mouth:  no deformity or lesions Neck:  no masses, thyromegaly, or abnormal cervical nodes Lungs:  clear bilaterally to auscultation and percussion Heart:  regular rate and rhythm, S1, S2 without murmurs, rubs, gallops, or clicks Extremities:  no clubbing, cyanosis, edema, or deformity noted Neurologic:  normal CN II-XII and strength normal.   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  Will repeat her chest xray and labs, and then decide when she can start to taper her prednisone dose.  Problem # 2:  ASTHMA (ICD-493.90) She is to continue dulera and as needed proair.  Complete Medication List: 1)  Prednisone 20 Mg Tabs (Prednisone) .... Two by mouth once daily 2)  Dulera 200-5 Mcg/act Aero (Mometasone furo-formoterol fum) .Marland Kitchen.. 1-2 puffs every 12 hrs ; gargle & spit after use 3)  Proair Hfa 108 (90 Base) Mcg/act Aers (Albuterol sulfate) .Marland Kitchen.. 1-2 puffs four times daily as needed 4)  Nasonex 50 Mcg/act Susp (Mometasone furoate) .... Use 1 to 2 sprays in each nostril daily 5)  Zyrtec 10 Mg Tabs (Cetirizine hcl) .Marland Kitchen.. 1 by mouth daily 6)  Amlodipine Besylate 10 Mg Tabs (Amlodipine besylate) .Marland Kitchen.. 1 by mouth once daily 7)  Diovan 160 Mg Tabs (Valsartan) .Marland Kitchen.. 1 once daily after benicar samples completed 8)  Sertraline Hcl 100 Mg Tabs (Sertraline hcl) .Marland Kitchen.. 1 by mouth daily 9)  Lorazepam 1 Mg Tabs (Lorazepam) .... 1/2 by mouth every 8 hours as needed 10)  Omeprazole 20 Mg Cpdr (Omeprazole) .Marland Kitchen.. 1 by mouth daily 11)  Allopurinol 300 Mg Tabs (Allopurinol) .Marland Kitchen.. 1 by mouth qd 12)  Oscal 500/200 D-3 Tabs (Calcium-vitamin d tabs) .... 2 tabs daily 13)  Vitamin D 1000 Unit Tabs (Cholecalciferol) .Marland Kitchen.. 1 by mouth daily 14)  Vitamin B-1 100 Mg Tabs (Thiamine hcl) .Marland Kitchen.. 1 by mouth daily as needed in the summer 15)  Multivitamins Tabs (Multiple vitamin) .Marland Kitchen.. 1 by mouth daily 16)  Mucinex Dm 30-600 Mg Xr12h-tab (Dextromethorphan-guaifenesin) .Marland Kitchen.. 1 by mouth daily as needed 17)  Tylenol Extra Strength 500 Mg Tabs (Acetaminophen) .... 2 by mouth two times a day as needed  Other Orders: T-2 View CXR (71020TC) Est.  Patient Level IV ([address]109) TLB-BMP (Basic Metabolic Panel-BMET) (80048-METABOL) TLB-CBC Platelet - w/Differential (85025-CBCD) TLB-Hepatic/Liver Function Pnl (80076-HEPATIC) T-IgE (Immunoglobulin E) (60454-09811)  Patient Instructions: 1)  You have eosinophilic pneumonia 2)  Will schedule chest xray 3)  Lab test today 4)  Continue prednisone 40 mg once daily  5)  Follow up in 4 weeks

## 2011-02-16 LAB — FUNGUS CULTURE W SMEAR
Fungal Smear: NONE SEEN
Fungal Smear: NONE SEEN

## 2011-02-17 ENCOUNTER — Telehealth: Payer: Self-pay | Admitting: Internal Medicine

## 2011-02-19 NOTE — Letter (Signed)
Summary: [employer] Pre Authorization Confirmation CT Chest   [employer] Pre Authorization Confirmation CT Chest   Imported By: Roderic Ovens 02/14/2011 15:51:03  _____________________________________________________________________  External Attachment:    Type:   Image     Comment:   External Document

## 2011-02-25 NOTE — Progress Notes (Signed)
Summary: refill--Lorazepam  Phone Note Refill Request Message from:  Fax from Pharmacy on February 17, 2011 8:16 AM  Refills Requested: Medication #1:  LORAZEPAM 1 MG TABS 1/2 by mouth every 8 hours as needed [facility]- [address] - fax [phone]  Initial call taken by: Okey Regal [place],  February 17, 2011 8:17 AM  Follow-up for Phone Call        RX'ed by Dr.Sood's office Follow-up by: Shonna Chock CMA,  February 17, 2011 9:59 AM  Additional Follow-up for Phone Call Additional follow up Details #1::        Dr. Craige Cotta has never filled this medication. All records show Dr. Alwyn Ren has been filling this for her. Additional Follow-up by: Michel Bickers CMA,  February 17, 2011 11:01 AM    Additional Follow-up for Phone Call Additional follow up Details #2::    Dr.Hopper please advise./Chrae [facility] CMA  February 17, 2011 1:23 PM   Additional Follow-up for Phone Call Additional follow up Details #3:: Details for Additional Follow-up Action Taken: refil X 6 mos.  Marga Melnick MD,  February 17, 2011 1:49 PM  Please advise of qty. Thanks. Lucious Groves CMA  February 17, 2011 2:55 PM   #60, R X 1  Prescriptions: LORAZEPAM 1 MG TABS (LORAZEPAM) 1/2 by mouth every 8 hours as needed  #60 x 1   Entered by:   Lucious Groves CMA   Authorized by:   Marga Melnick MD   Signed by:   Lucious Groves CMA on 02/17/2011   Method used:   Printed then faxed to ...       [facility] (retail)       [address]       [place], [place]  [address]       Ph: [phone]       Fax: [phone]   RxID:   [id number]

## 2011-02-27 ENCOUNTER — Ambulatory Visit (INDEPENDENT_AMBULATORY_CARE_PROVIDER_SITE_OTHER)
Admission: RE | Admit: 2011-02-27 | Discharge: 2011-02-27 | Disposition: A | Discharge status: Home / Self Care | Payer: Managed Care, Other (non HMO) | Source: Ambulatory Visit | Attending: Pulmonary Disease | Admitting: Pulmonary Disease

## 2011-02-27 ENCOUNTER — Ambulatory Visit (INDEPENDENT_AMBULATORY_CARE_PROVIDER_SITE_OTHER): Payer: Managed Care, Other (non HMO) | Admitting: Pulmonary Disease

## 2011-02-27 ENCOUNTER — Other Ambulatory Visit: Payer: Self-pay | Admitting: Pulmonary Disease

## 2011-02-27 ENCOUNTER — Encounter (INDEPENDENT_AMBULATORY_CARE_PROVIDER_SITE_OTHER): Payer: Self-pay | Admitting: *Deleted

## 2011-02-27 ENCOUNTER — Encounter: Payer: Self-pay | Admitting: Pulmonary Disease

## 2011-02-27 ENCOUNTER — Other Ambulatory Visit: Payer: Managed Care, Other (non HMO)

## 2011-02-27 DIAGNOSIS — J8289 Other pulmonary eosinophilia, not elsewhere classified: Secondary | ICD-10-CM

## 2011-02-27 DIAGNOSIS — J45909 Unspecified asthma, uncomplicated: Secondary | ICD-10-CM

## 2011-02-27 DIAGNOSIS — B370 Candidal stomatitis: Secondary | ICD-10-CM | POA: Insufficient documentation

## 2011-02-27 DIAGNOSIS — J82 Pulmonary eosinophilia, not elsewhere classified: Secondary | ICD-10-CM

## 2011-02-27 DIAGNOSIS — M25539 Pain in unspecified wrist: Secondary | ICD-10-CM | POA: Insufficient documentation

## 2011-02-27 LAB — HEPATIC FUNCTION PANEL
ALT: 42 U/L — ABNORMAL HIGH (ref 0–35)
AST: 29 U/L (ref 0–37)
Albumin: 4 g/dL (ref 3.5–5.2)
Alkaline Phosphatase: 39 U/L (ref 39–117)
Bilirubin, Direct: 0.1 mg/dL (ref 0.0–0.3)
Total Bilirubin: 0.5 mg/dL (ref 0.3–1.2)
Total Protein: 6.6 g/dL (ref 6.0–8.3)

## 2011-02-27 LAB — CBC WITH DIFFERENTIAL/PLATELET
Basophils Absolute: 0 10*3/uL (ref 0.0–0.1)
Basophils Relative: 0.3 % (ref 0.0–3.0)
Eosinophils Absolute: 0.1 10*3/uL (ref 0.0–0.7)
Eosinophils Relative: 1.4 % (ref 0.0–5.0)
HCT: 38.4 % (ref 36.0–46.0)
Hemoglobin: 13.2 g/dL (ref 12.0–15.0)
Lymphocytes Relative: 22 % (ref 12.0–46.0)
Lymphs Abs: 2.1 10*3/uL (ref 0.7–4.0)
MCHC: 34.4 g/dL (ref 30.0–36.0)
MCV: 92.1 fl (ref 78.0–100.0)
Monocytes Absolute: 1.1 10*3/uL — ABNORMAL HIGH (ref 0.1–1.0)
Monocytes Relative: 11.6 % (ref 3.0–12.0)
Neutro Abs: 6.1 10*3/uL (ref 1.4–7.7)
Neutrophils Relative %: 64.7 % (ref 43.0–77.0)
Platelets: 235 10*3/uL (ref 150.0–400.0)
RBC: 4.17 Mil/uL (ref 3.87–5.11)
RDW: 16.4 % — ABNORMAL HIGH (ref 11.5–14.6)
WBC: 9.5 10*3/uL (ref 4.5–10.5)

## 2011-02-27 LAB — BASIC METABOLIC PANEL
BUN: 21 mg/dL (ref 6–23)
CO2: 32 mEq/L (ref 19–32)
Calcium: 9.3 mg/dL (ref 8.4–10.5)
Chloride: 97 mEq/L (ref 96–112)
Creatinine, Ser: 0.8 mg/dL (ref 0.4–1.2)
GFR: 75.85 mL/min (ref >60.00)
Glucose, Bld: 76 mg/dL (ref 70–99)
Potassium: 4.1 mEq/L (ref 3.5–5.1)
Sodium: 137 mEq/L (ref 135–145)

## 2011-03-04 ENCOUNTER — Telehealth (INDEPENDENT_AMBULATORY_CARE_PROVIDER_SITE_OTHER): Payer: Self-pay | Admitting: *Deleted

## 2011-03-05 LAB — AFB CULTURE WITH SMEAR (NOT AT ARMC)
Acid Fast Smear: NONE SEEN
Acid Fast Smear: NONE SEEN

## 2011-03-06 NOTE — Assessment & Plan Note (Signed)
Summary: 2 week rov//sh   Visit Type:  Follow-up Copy to:  Dr. Marga Melnick Primary Provider/Referring Provider:  Loreen Freud  CC:  Follow up. Pt states her breathing has been doing "alright". Pt states she is still having a dry cough. Pt c/o swelling in her ankles and face since starting taking the prednisone.  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  She has noticed more throat soreness, and pain with swallowing.  She still has an occasional, dry cough.  She denies fever, chest pain, skin rash, sputum, hemoptysis, or sweats.  She remains on 40 mg prednisone.    She has been gaing weight, and getting puffy legs.  She feels her hands get numb and clumsy at times after she has been working for a while.   Current Medications (verified): 1)  Prednisone 20 Mg Tabs (Prednisone) .... Two By Mouth Once Daily 2)  Dulera 200-5 Mcg/act Aero (Mometasone Furo-Formoterol Fum) .Marland Kitchen.. 1-2 Puffs Every 12 Hrs ; Gargle & Spit After Use 3)  Proair Hfa 108 (90 Base) Mcg/act Aers (Albuterol Sulfate) .Marland Kitchen.. 1-2 Puffs Four Times Daily As Needed 4)  Nasonex 50 Mcg/act  Susp (Mometasone Furoate) .... Use 1 To 2 Sprays in Each Nostril Daily 5)  Zyrtec 10 Mg Tabs (Cetirizine Hcl) .Marland Kitchen.. 1 By Mouth Daily 6)  Amlodipine Besylate 10 Mg Tabs (Amlodipine Besylate) .Marland Kitchen.. 1 By Mouth Once Daily 7)  Diovan 160 Mg  Tabs (Valsartan) .Marland Kitchen.. 1 Once Daily After Benicar Samples Completed 8)  Sertraline Hcl 100 Mg Tabs (Sertraline Hcl) .Marland Kitchen.. 1 By Mouth Daily 9)  Lorazepam 1 Mg Tabs (Lorazepam) .... 1/2 By Mouth Every 8 Hours As Needed 10)  Omeprazole 20 Mg Cpdr (Omeprazole) .Marland Kitchen.. 1 By Mouth Daily 11)  Allopurinol 300 Mg Tabs (Allopurinol) .Marland Kitchen.. 1 By Mouth Qd 12)  Oscal 500/200 D-3  Tabs (Calcium-Vitamin D Tabs) .... 2 Tabs Daily 13)  Vitamin D 1000 Unit Tabs (Cholecalciferol) .Marland Kitchen.. 1 By Mouth Daily 14)  Vitamin B-1 100 Mg Tabs (Thiamine Hcl) .Marland Kitchen.. 1 By  Mouth Daily As Needed in The Summer 15)  Multivitamins  Tabs (Multiple Vitamin) .Marland Kitchen.. 1 By Mouth Daily 16)  Mucinex Dm 30-600 Mg Xr12h-Tab (Dextromethorphan-Guaifenesin) .Marland Kitchen.. 1 By Mouth Daily As Needed 17)  Tylenol Extra Strength 500 Mg Tabs (Acetaminophen) .... 2 By Mouth Two Times A Day As Needed  Allergies (verified): 1)  ! Sulfa 2)  ! Percodan 3)  ! Lotensin 4)  ! Vioxx 5)  ! 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 TAH+ BSO for dysfunctional bleeding T&A Colonoscopy-2001(due 2011) Sinus surgery-2001 Appendectomy Esophageal dilation X1, Dr Juanda Chance Bronchoscopy Jan. 2012, Dr. Craige Cotta  Vital Signs:  Patient profile:   70 year old female Height:      64 inches Weight:      169.38 pounds BMI:     29.18 O2 Sat:  97 % on Room air Temp:     98.1 degrees F oral Pulse rate:   78 / minute BP sitting:   138 / 82  (left arm) Cuff size:   regular  Vitals Entered By: Carver Fila (February 27, 2011 9:17 AM)  O2 Flow:  Room air CC: Follow up. Pt states her breathing has been doing "alright". Pt states she is still having a dry cough. Pt c/o swelling in her ankles and face since starting taking the prednisone Comments meds and allergies updated Phone number updated Mindy Silva  February 27, 2011 9:17 AM    Physical Exam  General:  normal appearance and healthy appearing.   Nose:  no deformity, discharge, inflammation, or lesions Mouth:  whitish build up in posterior pharynx with  surrounding erythema Neck:  no masses, thyromegaly, or abnormal cervical nodes Lungs:  faint basilar crackles, no wheeze Heart:  regular rate and rhythm, S1, S2 without murmurs, rubs, gallops, or clicks Abdomen:  bowel sounds positive; abdomen soft and non-tender without masses, or organomegaly Extremities:  no clubbing, cyanosis, edema, or deformity noted Neurologic:  normal CN II-XII and strength normal.   Skin:  intact without lesions or rashes Cervical Nodes:  no significant adenopathy   Wrist/Hand Exam  Skin:    Intact with no erythema; no scarring.    Inspection:    Inspection is normal.    Palpation:    Non-tender to palpation bilaterally.    Vascular:    Radial, ulnar, brachial, and axillary pulses 2+ and symmetric; capillary refill less than 2 seconds; no evidence of ischemia, clubbing, or cyanosis.    Motor:    Normal strength in the upper extremities.     Impression & Recommendations:  Problem # 1:  EOSINOPHILIC PNEUMONIA (ICD-518.3) Will repeat chest xray and labs, and then decide about plan to taper prednisone.  Problem # 2:  ASTHMA (ICD-493.90) Stable on current regimen.  Problem # 3:  THRUSH (ICD-112.0) Will give 5 days of nystatin. Emphasized need to rinse mouth after using dulera.  Problem # 4:  WRIST PAIN (ICD-719.43) Her symptoms are suggestive of carpal tunnel.  Advised her to d/w primary care.  Medications Added to Medication List This Visit: 1)  Nystatin 100000 Unit/ml Susp (Nystatin) .... 5 ml swish and swallow four times per day for 5 days  Complete Medication List: 1)  Prednisone 20 Mg Tabs (Prednisone) .... Two by mouth once daily 2)  Dulera 200-5 Mcg/act Aero (Mometasone furo-formoterol fum) .Marland Kitchen.. 1-2 puffs every 12 hrs ; gargle & spit after use 3)  Proair Hfa 108 (90 Base) Mcg/act Aers (Albuterol sulfate) .Marland Kitchen.. 1-2 puffs four times daily as needed 4)  Nasonex 50 Mcg/act Susp (Mometasone furoate) .... Use 1 to 2 sprays in each nostril  daily 5)  Zyrtec 10 Mg Tabs (Cetirizine hcl) .Marland Kitchen.. 1 by mouth daily 6)  Amlodipine Besylate 10 Mg Tabs (Amlodipine besylate) .Marland Kitchen.. 1 by mouth once daily 7)  Diovan 160 Mg Tabs (Valsartan) .Marland Kitchen.. 1 once daily after benicar samples completed 8)  Sertraline Hcl 100 Mg Tabs (Sertraline hcl) .Marland Kitchen.. 1 by mouth daily 9)  Lorazepam 1 Mg Tabs (Lorazepam) .... 1/2 by mouth every 8 hours as needed 10)  Omeprazole 20 Mg Cpdr (Omeprazole) .Marland Kitchen.. 1 by mouth daily 11)  Allopurinol 300 Mg Tabs (Allopurinol) .Marland Kitchen.. 1 by mouth qd 12)  Oscal 500/200 D-3 Tabs (Calcium-vitamin d tabs) .... 2 tabs daily 13)  Vitamin D 1000 Unit Tabs (Cholecalciferol) .Marland Kitchen.. 1 by  mouth daily 14)  Vitamin B-1 100 Mg Tabs (Thiamine hcl) .Marland Kitchen.. 1 by mouth daily as needed in the summer 15)  Multivitamins Tabs (Multiple vitamin) .Marland Kitchen.. 1 by mouth daily 16)  Mucinex Dm 30-600 Mg Xr12h-tab (Dextromethorphan-guaifenesin) .Marland Kitchen.. 1 by mouth daily as needed 17)  Tylenol Extra Strength 500 Mg Tabs (Acetaminophen) .... 2 by mouth two times a day as needed 18)  Nystatin 100000 Unit/ml Susp (Nystatin) .... 5 ml swish and swallow four times per day for 5 days  Other Orders: Est. Patient Level IV (10272) TLB-BMP (Basic Metabolic Panel-BMET) (80048-METABOL) TLB-CBC Platelet - w/Differential (85025-CBCD) TLB-Hepatic/Liver Function Pnl (80076-HEPATIC) T-2 View CXR (71020TC)  Patient Instructions: 1)  Chest xray and lab test today; will call you with results and discuss plans for prednisone dose 2)  Nystatin swish and swallow 5 ml four times per day for 5 days 3)  Rinse your mouth after using dulera 4)  Follow up in 6 to 8 weeks Prescriptions: NYSTATIN 100000 UNIT/ML SUSP (NYSTATIN) 5 ml swish and swallow four times per day for 5 days  #100 ml x 0   Entered and Authorized by:   [name] MD   Signed by:   [name] MD on 02/27/2011   Method used:   Electronically to        [facility] (retail)       [address]        [place], [place]  [address]       Ph: [phone]       Fax: [phone]   RxID:   [id number]

## 2011-03-13 ENCOUNTER — Encounter: Payer: Self-pay | Admitting: Internal Medicine

## 2011-03-13 ENCOUNTER — Ambulatory Visit (INDEPENDENT_AMBULATORY_CARE_PROVIDER_SITE_OTHER): Payer: Managed Care, Other (non HMO) | Admitting: Internal Medicine

## 2011-03-13 DIAGNOSIS — J45909 Unspecified asthma, uncomplicated: Secondary | ICD-10-CM

## 2011-03-13 DIAGNOSIS — J8289 Other pulmonary eosinophilia, not elsewhere classified: Secondary | ICD-10-CM

## 2011-03-18 NOTE — Progress Notes (Signed)
Summary: Wants a written letter--No  Phone Note Call from Patient Call back at Home Phone [phone]   Caller: Patient Call For: SOOD Summary of Call: PT RETURNED CALL FROM [facility] Initial call taken by: Tivis Ringer, CNA,  March 04, 2011 9:41 AM  Follow-up for Phone Call        Pt returned my call about her lab and cxr results. Pt verbalized understanding. Pt wants to know if Dr. Craige Cotta would write her a letter stating it is okay for her to have bottle water at work. Pt states she works at [employer] and the health department will not allow them to have bottle water in their work station. I informed pt that Dr. Craige Cotta was not in the offfice until tomorrow and [facility] would have to approve for [place] to wright this letter. She verbalized understanding and would await Dr. Evlyn Courier answer about the letter Carver Fila  March 04, 2011 10:18 AM   Additional Follow-up for Phone Call Additional follow up Details #1::        Please inform patient that there is no justification based on her respiratory disease to contradict health department requirements prohibiting water bottles in her work place.  Therefore I can not write a letter for her regarding this.  Please advise her to ask her employer if frequent water breaks may be an option. Additional Follow-up by: [name] MD,  March 12, 2011 6:04 PM    Additional Follow-up for Phone Call Additional follow up Details #2::    Called and spoke with patient and informed her that [facility] can't write her a letter and she verbalized understanding Carver Fila  March 13, 2011 8:58 AM

## 2011-03-18 NOTE — Assessment & Plan Note (Signed)
Summary: Pulmonary/  acute ov for cough ? upper airway source   Copy to:  Dr. 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  Pt denies any significant sore throat, nasal congestion or excess secretions, fever, chills, sweats, unintended wt loss, pleuritic or exertional cp, orthopnea pnd or leg swelling.  No hemoptysis.  Pt also denies any obvious fluctuation in symptoms with weather or environmental change or other alleviating or aggravating factors.         Current Medications (verified): 1)  Prednisone 20 Mg Tabs (Prednisone) .... 1.5 Pills Once Daily 2)  Dulera 200-5 Mcg/act Aero (Mometasone Furo-Formoterol Fum) .Marland Kitchen.. 1-2 Puffs Every 12 Hrs ; Gargle & Spit After Use 3)  Proair Hfa 108 (90 Base) Mcg/act Aers (Albuterol Sulfate) .Marland Kitchen.. 1-2 Puffs Four Times Daily As Needed 4)  Nasonex 50 Mcg/act  Susp (Mometasone Furoate) .... Use 1 To 2 Sprays in Each Nostril Daily 5)  Zyrtec 10 Mg Tabs (Cetirizine Hcl) .Marland Kitchen.. 1 By Mouth Daily 6)  Amlodipine Besylate 10 Mg Tabs (Amlodipine Besylate) .Marland Kitchen.. 1 By Mouth Once Daily 7)  Diovan 160 Mg  Tabs (Valsartan) .Marland Kitchen.. 1 Once Daily After Benicar Samples Completed 8)  Sertraline Hcl 100 Mg Tabs (Sertraline Hcl) .Marland Kitchen.. 1 By Mouth Daily 9)  Lorazepam 1 Mg Tabs (Lorazepam) .... 1/2 By Mouth Every 8 Hours As Needed 10)  Omeprazole 20 Mg Cpdr (Omeprazole) .Marland Kitchen.. 1 By Mouth Daily 11)  Allopurinol 300 Mg Tabs (Allopurinol) .Marland Kitchen.. 1 By Mouth Once Daily 12)  Oscal 500/200 D-3  Tabs  (Calcium-Vitamin D Tabs) .... 2 Tabs Daily 13)  Vitamin D 1000 Unit Tabs (Cholecalciferol) .Marland Kitchen.. 1 By Mouth Daily 14)  Vitamin B-1 100 Mg Tabs (Thiamine Hcl) .Marland Kitchen.. 1 By Mouth Daily As Needed in The Summer 15)  Century Senior  Tabs (Multiple Vitamins-Minerals) .Marland Kitchen.. 1 Once Daily 16)  Mucinex Dm 30-600 Mg Xr12h-Tab (Dextromethorphan-Guaifenesin) .Marland Kitchen.. 1 By Mouth Daily As Needed 17)  Tylenol Extra Strength 500 Mg Tabs (Acetaminophen) .... 2 By Mouth Two Times A Day As Needed  Allergies (verified): 1)  ! Sulfa 2)  ! Percodan 3)  ! Lotensin 4)  ! Vioxx 5)  ! 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      - 39% Eosinophils on CBC 01/14/11      - 01/14/11: HIV negative, ACE 39, RF negative, ANA 1:40, ANCA negtive      - BAL with 48% Eosinophils 01/21/11      - IgE 282 from 02/05/11 Asthma Hypertension Osteopenia (BMD  T score -1.6 @ L femoral neck 11/27/2009), S/P Fosamax X 5 years (?) GERD Depression Hyperlipidemia Anxiety Fractured shoulder 9/09,Dr Gean Birchwood   Vital Signs:  Patient profile:   70 year old female Weight:      170 pounds O2 Sat:      96 % on Room air Temp:     97.9 degrees F  oral Pulse rate:   79 / minute BP sitting:   132 / 78  (left arm)  Vitals Entered By: Vernie Murders (March 13, 2011 10:16 AM)  O2 Flow:  Room air  Physical Exam  Additional Exam:  anxious amb bf nad wt 169 > 170 March 13, 2011  HEENT: nl dentition, turbinates, and orophanx. Nl external ear canals without cough reflex NECK :  without JVD/Nodes/TM/ nl carotid upstrokes bilaterally LUNGS: no acc muscle use, clear to A and P bilaterally without cough on insp or exp maneuvers CV:  RRR  no s3 or murmur or increase in P2, no edema  ABD:  soft and nontender with nl excursion in the supine position. 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) Labs and xray reviewed from  02/27/2011 No evidence of dz activity at present  > See instructions for specific recommendations      Problem # 2:  ASTHMA (ICD-493.90) Not sure this is asthma but clearly an airway problem    DDX of  difficult airways managment all start with A and  include Adherence, Ace Inhibitors, Acid Reflux, Active Sinus Disease, Alpha 1 Antitripsin deficiency, Anxiety masquerading as Airways dz,  ABPA,  allergy(esp in young), Aspiration (esp in elderly), Adverse effects of DPI,  Active smokers, plus two Bs  = Bronchiectasis and Beta blocker use..and one C= CHF    ?Acid reflux issue:  See instructions for specific recommendations  (intolerance to ACE's which she is reported to have  always suggests an upper airway problem frequently reflux related)  ? sinusitis > augmentin then sinus ct if not better  Adherence seems ok.  I spent extra time with the patient today explaining optimal mdi  technique.  This improved from  50-75%  Medications Added to Medication List This Visit: 1)  Prednisone 20 Mg Tabs (Prednisone) .Marland Kitchen.. 1 in am with breakfast 2)  Omeprazole 20 Mg Cpdr (Omeprazole) .... Take  one 30-60 min before first meal of the day 3)  Allopurinol 300 Mg Tabs (Allopurinol) .Marland Kitchen.. 1 by mouth once daily 4)  Century Senior Tabs (Multiple vitamins-minerals) .Marland Kitchen.. 1 once daily 5)  Mucinex Dm 30-600 Mg Xr12h-tab (Dextromethorphan-guaifenesin) .Marland Kitchen.. 1 by mouth daily as needed for cough or congestion 6)  Augmentin 875-125 Mg Tabs (Amoxicillin-pot clavulanate) .... By mouth twice daily  Other Orders: Est. Patient Level IV (16109)  Patient Instructions: 1)  Omeprazole Take  one 30-60 min before first meal of the day  2)  Add pepcid 20 mg otc one bedtime  3)  if cough >  mucinex dm  4)  Change dulera 100 2 puffs first thing  in am and 2 puffs again in pm  about 12 hours later  5)  Reduce Prednisone to 20 mg each am 6)  Augmentin 875 twice daily x 10 days 7)  GERD (REFLUX)  is a common cause of respiratory symptoms. It commonly presents without heartburn and can be treated with medication, but also with lifestyle changes including avoidance of late meals, excessive alcohol, smoking cessation, and avoid fatty foods, chocolate, peppermint, colas, red wine, and acidic juices such as orange juice. NO MINT OR MENTHOL PRODUCTS SO NO COUGH DROPS  8)  USE SUGARLESS CANDY INSTEAD (jolley ranchers)  9)  NO OIL  BASED VITAMINS   Prescriptions: AUGMENTIN 875-125 MG  TABS (AMOXICILLIN-POT CLAVULANATE) By mouth twice daily  #20 x 0   Entered and Authorized by:   Nyoka Cowden MD   Signed by:   Nyoka Cowden MD on 03/13/2011   Method used:   Electronically to        [facility] (retail)       [address]       [place], [place]  [address]       Ph: [phone]       Fax: [phone]   RxID:   [id number]

## 2011-03-28 ENCOUNTER — Other Ambulatory Visit: Payer: Self-pay | Admitting: Internal Medicine

## 2011-03-28 MED ORDER — ALLOPURINOL 300 MG PO TABS: 300.0000 mg | ORAL_TABLET | Freq: Every day | ORAL | Status: DC

## 2011-03-28 NOTE — Telephone Encounter (Signed)
Uric Acid level due to be checked 274.9

## 2011-03-31 ENCOUNTER — Other Ambulatory Visit: Payer: Self-pay | Admitting: *Deleted

## 2011-03-31 MED ORDER — LORAZEPAM 1 MG PO TABS: 1.0000 mg | ORAL_TABLET | Freq: Three times a day (TID) | ORAL | Status: DC | PRN

## 2011-04-15 ENCOUNTER — Encounter: Payer: Self-pay | Admitting: Pulmonary Disease

## 2011-04-17 ENCOUNTER — Ambulatory Visit (INDEPENDENT_AMBULATORY_CARE_PROVIDER_SITE_OTHER): Payer: Managed Care, Other (non HMO) | Admitting: Pulmonary Disease

## 2011-04-17 ENCOUNTER — Encounter: Payer: Self-pay | Admitting: Pulmonary Disease

## 2011-04-17 VITALS — BP 122/72 | HR 78 | Temp 97.9°F | Ht 64.0 in | Wt 173.2 lb

## 2011-04-17 DIAGNOSIS — J45909 Unspecified asthma, uncomplicated: Secondary | ICD-10-CM

## 2011-04-17 DIAGNOSIS — J8289 Other pulmonary eosinophilia, not elsewhere classified: Secondary | ICD-10-CM

## 2011-04-17 DIAGNOSIS — J309 Allergic rhinitis, unspecified: Secondary | ICD-10-CM | POA: Insufficient documentation

## 2011-04-17 MED ORDER — PREDNISONE 5 MG PO TABS: ORAL_TABLET | ORAL | Status: DC

## 2011-04-17 NOTE — Assessment & Plan Note (Signed)
She has clinical improvement.  Will continue prednisone taper.  Will have her decrease to 15 mg prednisone daily for two weeks, and then 10 mg daily until next visit.  Will defer lab and chest xray at this time.

## 2011-04-17 NOTE — Progress Notes (Signed)
Subjective:    Patient ID: Lisa Larson, female    DOB: 07-25-1941, 70 y.o.   MRN: [id number]  HPI 70 yo female with eosinophilc pneumonia, and asthma.  She was treated for a bronchitis since her last visit with me.  She then had dental extraction and was started on amoxicillin.  Her breathing has been doing well.  She denies cough, skin rash, fever, sweats, joint swelling, or chest pain.  She is not having diarrhea.  She is concerned about weight gain from prednisone.  Past Medical History  Diagnosis Date  . 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 IGE 282 from 02-05-11  . Asthma   . Hypertension   . Osteopenia     BMD t score-1.6 at L femoral neck 11-27-2009, s/p fosamax x 5 years  . GERD (gastroesophageal reflux disease)   . Depression   . Hyperlipidemia   . Anxiety      Family History  Problem Relation Age of Onset  . Arthritis Father   . Rheum arthritis Father   . Hypertension Father   . Hypertension Mother   . Alzheimer's disease Mother   . Hypertension Brother   . Diabetes Brother   . Cancer Son     laryngeal     History   Social History  . Marital Status: Married    Spouse Name: N/A    Number of Children: N/A  . Years of Education: N/A   Occupational History  . grocery store clerk    Social History Main Topics  . Smoking status: Former Smoker -- 1.0 packs/day for 15 years    Types: Cigarettes    Quit date: 12/29/1968  . Smokeless tobacco: Not on file  . Alcohol Use: 7.0 oz/week    14 drink(s) per week  . Drug Use: No  . Sexually Active: Not on file   Other Topics Concern  . Not on file   Social History Narrative  . No narrative on file     Allergies  Allergen Reactions  . Benazepril Hcl   . Oxycodone-Aspirin   . Rofecoxib   . Sulfonamide Derivatives      Outpatient Prescriptions Prior to Visit  Medication Sig  Dispense Refill  . acetaminophen (TYLENOL) 500 MG tablet Take 500 mg by mouth every 6 (six) hours as needed.        Marland Kitchen albuterol (PROAIR HFA) 108 (90 BASE) MCG/ACT inhaler Inhale 2 puffs into the lungs every 6 (six) hours as needed.        Marland Kitchen allopurinol (ZYLOPRIM) 300 MG tablet Take 1 tablet (300 mg total) by mouth daily.  30 tablet  0  . amLODipine (NORVASC) 10 MG tablet Take 10 mg by mouth daily.        . calcium-vitamin D (OSCAL WITH D) 500-200 MG-UNIT per tablet Take 2 tablets by mouth daily.        . cetirizine (ZYRTEC) 10 MG tablet Take 10 mg by mouth daily.        . Cholecalciferol (VITAMIN D3) 1000 UNITS tablet Take 1,000 Units by mouth daily.        Marland Kitchen dextromethorphan-guaiFENesin (MUCINEX DM) 30-600 MG per 12 hr tablet Take 1 tablet by mouth every 12 (twelve) hours as needed.        Marland Kitchen LORazepam (ATIVAN) 1 MG tablet Take 1 tablet (1 mg total) by mouth every 8 (eight)  hours as needed for anxiety.  30 tablet  1  . mometasone (NASONEX) 50 MCG/ACT nasal spray 2 sprays by Nasal route daily.        . Multiple Vitamins-Minerals (CENTURY SENIOR FORMULA PO) Take 1 tablet by mouth daily.        Marland Kitchen omeprazole (PRILOSEC) 20 MG capsule Take 20 mg by mouth daily.        . predniSONE (DELTASONE) 20 MG tablet Take 20 mg by mouth daily.        . sertraline (ZOLOFT) 100 MG tablet Take 100 mg by mouth daily.        . Thiamine HCl (VITAMIN B-1) 100 MG tablet Take 100 mg by mouth daily.        . valsartan (DIOVAN) 160 MG tablet Take 160 mg by mouth daily.        Marland Kitchen amoxicillin-clavulanate (AUGMENTIN) 875-125 MG per tablet Take 1 tablet by mouth 2 (two) times daily.           Review of Systems     Objective:   Physical Exam Filed Vitals:   04/17/11 1357  BP: 122/72  Pulse: 78  Temp: 97.9 F (36.6 C)  TempSrc: Oral  Height: 5\' 4"  (1.626 m)  Weight: 173 lb 3.2 oz (78.563 kg)  SpO2: 97%     General: normal appearance and healthy appearing.  Nose: no deformity, discharge, inflammation, or lesions    Mouth: no exudate Neck: no masses, thyromegaly, or abnormal cervical nodes  Lungs: no wheeze or rales Heart: regular rate and rhythm, S1, S2 without murmurs, rubs, gallops, or clicks  Abdomen: bowel sounds positive; abdomen soft and non-tender without masses, or organomegaly  Extremities: no clubbing, cyanosis, edema, or deformity noted  Neurologic: normal CN II-XII and strength normal.  Skin: intact without lesions or rashes  Cervical Nodes: no significant adenopathy        Assessment & Plan:   EOSINOPHILIC PNEUMONIA She has clinical improvement.  Will continue prednisone taper.  Will have her decrease to 15 mg prednisone daily for two weeks, and then 10 mg daily until next visit.  Will defer lab and chest xray at this time.  ASTHMA She is to continue on dulera 100 bid and prn proair.  Allergic rhinitis She is to continue with nasonex and prn zyrtec.    Updated Medication List Outpatient Encounter Prescriptions as of 04/17/2011  Medication Sig Dispense Refill  . acetaminophen (TYLENOL) 500 MG tablet Take 500 mg by mouth every 6 (six) hours as needed.        Marland Kitchen albuterol (PROAIR HFA) 108 (90 BASE) MCG/ACT inhaler Inhale 2 puffs into the lungs every 6 (six) hours as needed.        Marland Kitchen allopurinol (ZYLOPRIM) 300 MG tablet Take 1 tablet (300 mg total) by mouth daily.  30 tablet  0  . amLODipine (NORVASC) 10 MG tablet Take 10 mg by mouth daily.        Marland Kitchen amoxicillin (AMOXIL) 500 MG capsule Take 500 mg by mouth 3 (three) times daily.        . calcium-vitamin D (OSCAL WITH D) 500-200 MG-UNIT per tablet Take 2 tablets by mouth daily.        . cetirizine (ZYRTEC) 10 MG tablet Take 10 mg by mouth daily.        . Cholecalciferol (VITAMIN D3) 1000 UNITS tablet Take 1,000 Units by mouth daily.        Marland Kitchen dextromethorphan-guaiFENesin (MUCINEX DM) 30-600 MG per 12 hr  tablet Take 1 tablet by mouth every 12 (twelve) hours as needed.        Marland Kitchen HYDROcodone-acetaminophen (NORCO) 5-325 MG per tablet 1-2  tablets every 4 hrs as needed      . LORazepam (ATIVAN) 1 MG tablet Take 1 tablet (1 mg total) by mouth every 8 (eight) hours as needed for anxiety.  30 tablet  1  . mometasone (NASONEX) 50 MCG/ACT nasal spray 2 sprays by Nasal route daily.        . Mometasone Furo-Formoterol Fum (DULERA) 100-5 MCG/ACT AERO Inhale into the lungs. 2 puffs twice a day       . Multiple Vitamins-Minerals (CENTURY SENIOR FORMULA PO) Take 1 tablet by mouth daily.        Marland Kitchen omeprazole (PRILOSEC) 20 MG capsule Take 20 mg by mouth daily.        . predniSONE (DELTASONE) 20 MG tablet Take 20 mg by mouth daily.        . sertraline (ZOLOFT) 100 MG tablet Take 100 mg by mouth daily.        . Thiamine HCl (VITAMIN B-1) 100 MG tablet Take 100 mg by mouth daily.        . valsartan (DIOVAN) 160 MG tablet Take 160 mg by mouth daily.        Marland Kitchen amoxicillin-clavulanate (AUGMENTIN) 875-125 MG per tablet Take 1 tablet by mouth 2 (two) times daily.        . predniSONE (DELTASONE) 5 MG tablet Uses as directed  14 tablet  0

## 2011-04-17 NOTE — Assessment & Plan Note (Signed)
She is to continue on dulera 100 bid and prn proair.

## 2011-04-17 NOTE — Assessment & Plan Note (Signed)
She is to continue with nasonex and prn zyrtec.

## 2011-04-17 NOTE — Patient Instructions (Signed)
Prednisone 15 mg per day for two weeks, then 10 mg per day until next visit

## 2011-05-04 ENCOUNTER — Other Ambulatory Visit: Payer: Self-pay | Admitting: Internal Medicine

## 2011-05-05 ENCOUNTER — Ambulatory Visit (INDEPENDENT_AMBULATORY_CARE_PROVIDER_SITE_OTHER): Payer: Managed Care, Other (non HMO) | Admitting: Critical Care Medicine

## 2011-05-05 ENCOUNTER — Telehealth: Payer: Self-pay | Admitting: Pulmonary Disease

## 2011-05-05 ENCOUNTER — Encounter: Payer: Self-pay | Admitting: *Deleted

## 2011-05-05 ENCOUNTER — Ambulatory Visit (INDEPENDENT_AMBULATORY_CARE_PROVIDER_SITE_OTHER)
Admission: RE | Admit: 2011-05-05 | Discharge: 2011-05-05 | Disposition: A | Discharge status: Home / Self Care | Payer: Managed Care, Other (non HMO) | Source: Ambulatory Visit | Attending: Critical Care Medicine | Admitting: Critical Care Medicine

## 2011-05-05 ENCOUNTER — Encounter: Payer: Self-pay | Admitting: Critical Care Medicine

## 2011-05-05 VITALS — BP 150/90 | HR 88 | Temp 98.2°F | Ht 61.0 in | Wt 176.4 lb

## 2011-05-05 DIAGNOSIS — M25471 Effusion, right ankle: Secondary | ICD-10-CM

## 2011-05-05 DIAGNOSIS — M25473 Effusion, unspecified ankle: Secondary | ICD-10-CM

## 2011-05-05 DIAGNOSIS — J8289 Other pulmonary eosinophilia, not elsewhere classified: Secondary | ICD-10-CM

## 2011-05-05 NOTE — Assessment & Plan Note (Signed)
dx Jan. 2012  - 39% Eosinophils on CBC 01/14/11  - 01/14/11: HIV negative, ACE 39, RF negative, ANA 1:40, ANCA negtive  - BAL with 48% Eosinophils 01/21/11  - IgE 282 from 02/05/11   Recent pred pulse and taper, now with pedal edema R>L with bruising over ankle, suspect traumatic bruise exacerbated by amlodipine and steroid use Note unremarkable ankle film, doubt DVT Plan Cont pred per dr Craige Cotta Ace wrap, stay off feet 48hrs No need for venous doppler

## 2011-05-05 NOTE — Progress Notes (Signed)
Subjective:    Patient ID: Lisa Larson, female    DOB: 11-25-1941, 70 y.o.   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  She was treated for a bronchitis since her last visit with me.  She then had dental extraction and was started on amoxicillin.  Her breathing has been doing well.  She denies cough, skin rash, fever, sweats, joint swelling, or chest pain.  She is not having diarrhea.  She is concerned about weight gain from prednisone.  05/05/2011 OV Acute workin  Sood Pt. At last ov for eos PNA pred was to taper to 10mg /d.  Elwin Sleight and nasonex to continue Since last ov notes ankle swelling on ankle.  Worse as day progresses.  Notes pain in the ankle and hard to walk on this. Had tooth cleaned , dentist saw the pt.  Advised pt see pulm as she is on pred.  No spec trauma.  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 IGE 282 from 02-05-11  . Asthma   . Hypertension   . Osteopenia     BMD t score-1.6 at L femoral neck 11-27-2009, s/p fosamax x 5 years  . GERD (gastroesophageal reflux disease)   . Depression   . Hyperlipidemia   . Anxiety      Family History  Problem Relation Age of Onset  . Arthritis Father   . Rheum arthritis Father   . Hypertension Father   . Hypertension Mother   . Alzheimer's disease Mother   . Hypertension Brother   . Diabetes Brother   . Cancer Son     laryngeal     History   Social History  . Marital Status: Married    Spouse Name: N/A    Number of Children: N/A  . Years of Education: N/A   Occupational History  . grocery store clerk    Social History Main Topics  . Smoking status: Former Smoker -- 1.0 packs/day for 15 years    Types: Cigarettes    Quit date: 12/29/1968  . Smokeless tobacco: Never  Used  . Alcohol Use: 7.0 oz/week    14 drink(s) per week  . Drug Use: No  . Sexually Active: Not on file   Other Topics Concern  . Not on file   Social History Narrative  . No narrative on file     Allergies  Allergen Reactions  . Benazepril Hcl   . Oxycodone-Aspirin   . Rofecoxib   . Sulfonamide Derivatives      Outpatient Prescriptions Prior to Visit  Medication Sig Dispense Refill  . acetaminophen (TYLENOL) 500 MG tablet Take 500 mg by mouth every 6 (six) hours as needed.        Marland Kitchen albuterol (PROAIR HFA) 108 (90 BASE) MCG/ACT inhaler Inhale 2 puffs into the lungs every 6 (six) hours as needed.        Marland Kitchen allopurinol (ZYLOPRIM) 300 MG tablet Take 1 tablet (300 mg total) by mouth daily.  30 tablet  0  . amLODipine (NORVASC) 10 MG tablet Take 10 mg by mouth daily.        . calcium-vitamin D (OSCAL WITH D) 500-200 MG-UNIT per tablet Take 2 tablets by mouth daily.        Marland Kitchen  cetirizine (ZYRTEC) 10 MG tablet Take 10 mg by mouth daily.        . Cholecalciferol (VITAMIN D3) 1000 UNITS tablet Take 1,000 Units by mouth daily.        Marland Kitchen dextromethorphan-guaiFENesin (MUCINEX DM) 30-600 MG per 12 hr tablet Take 1 tablet by mouth every 12 (twelve) hours as needed.        Marland Kitchen LORazepam (ATIVAN) 1 MG tablet Take 1 tablet (1 mg total) by mouth every 8 (eight) hours as needed for anxiety.  30 tablet  1  . mometasone (NASONEX) 50 MCG/ACT nasal spray 2 sprays by Nasal route daily.        . Mometasone Furo-Formoterol Fum (DULERA) 100-5 MCG/ACT AERO Inhale into the lungs. 2 puffs twice a day       . Multiple Vitamins-Minerals (CENTURY SENIOR FORMULA PO) Take 1 tablet by mouth daily.        Marland Kitchen omeprazole (PRILOSEC) 20 MG capsule Take 20 mg by mouth daily.       . predniSONE (DELTASONE) 20 MG tablet Take 10 mg by mouth daily.       . Thiamine HCl (VITAMIN B-1) 100 MG tablet Take 100 mg by mouth daily.        . valsartan (DIOVAN) 160 MG tablet Take 160 mg by mouth daily.        . sertraline (ZOLOFT) 100 MG  tablet Take 100 mg by mouth daily.        . predniSONE (DELTASONE) 5 MG tablet Uses as directed  14 tablet  0  . amoxicillin (AMOXIL) 500 MG capsule Take 500 mg by mouth 3 (three) times daily.        Marland Kitchen amoxicillin-clavulanate (AUGMENTIN) 875-125 MG per tablet Take 1 tablet by mouth 2 (two) times daily.        Marland Kitchen HYDROcodone-acetaminophen (NORCO) 5-325 MG per tablet 1-2 tablets every 4 hrs as needed         Review of Systems Constitutional:   No  weight loss, night sweats,  Fevers, chills, fatigue, lassitude. HEENT:   No headaches,  Difficulty swallowing,  Tooth/dental problems,  Sore throat,                No sneezing, itching, ear ache, nasal congestion, post nasal drip,   CV:  No chest pain,  Orthopnea, PND,  anasarca, dizziness, palpitations  GI  No heartburn, indigestion, abdominal pain, nausea, vomiting, diarrhea, change in bowel habits, loss of appetite  Resp: No shortness of breath with exertion or at rest.  No excess mucus, no productive cough,  No non-productive cough,  No coughing up of blood.  No change in color of mucus.  No wheezing.  No chest wall deformity  Skin: no rash or lesions.  GU: no dysuria, change in color of urine, no urgency or frequency.  No flank pain.  MS: .  No decreased range of motion.  No back pain.  Psych:  No change in mood or affect. No depression or anxiety.  No memory loss.      Objective:   Physical Exam  Filed Vitals:   05/05/11 0955  BP: 150/90  Pulse: 88  Temp: 98.2 F (36.8 C)  TempSrc: Oral  Height: 5\' 1"  (1.549 m)  Weight: 176 lb 6.4 oz (80.015 kg)  SpO2: 98%    Gen: Pleasant, well-nourished, in no distress,  normal affect  ENT: No lesions,  mouth clear,  oropharynx clear, no postnasal drip  Neck: No JVD, no TMG, no  carotid bruits  Lungs: No use of accessory muscles, no dullness to percussion, clear without rales or rhonchi  Cardiovascular: RRR, heart sounds normal, no murmur or gallops, no peripheral edema  Abdomen:  soft and NT, no HSM,  BS normal  Musculoskeletal: No deformities, no cyanosis or clubbing. Edema in R ankle, bruising in same area.  Neuro: alert, non focal  Skin: Warm, no lesions or rashes   R ankle Film 05/05/11 Findings: Soft tissue swelling diffusely, most pronounced laterally. No acute bony abnormality. Specifically, no fracture, subluxation, or dislocation. Soft tissues are intact. Advanced degenerative changes in the midfoot and hindfoot. Large plantar calcaneal spur.  IMPRESSION: No acute bony abnormality.    Filed Vitals:   05/05/11 0955  BP: 150/90  Pulse: 88  Temp: 98.2 F (36.8 C)  TempSrc: Oral  Height: 5\' 1"  (1.549 m)  Weight: 176 lb 6.4 oz (80.015 kg)  SpO2: 98%     General: normal appearance and healthy appearing.  Nose: no deformity, discharge, inflammation, or lesions  Mouth: no exudate Neck: no masses, thyromegaly, or abnormal cervical nodes  Lungs: no wheeze or rales Heart: regular rate and rhythm, S1, S2 without murmurs, rubs, gallops, or clicks  Abdomen: bowel sounds positive; abdomen soft and non-tender without masses, or organomegaly  Extremities: no clubbing, cyanosis, edema, or deformity noted  Neurologic: normal CN II-XII and strength normal.  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 acetaminophen (TYLENOL) 500 MG tablet Take 500 mg by mouth every 6 (six) hours as needed.        Marland Kitchen albuterol (PROAIR HFA) 108 (90 BASE) MCG/ACT inhaler Inhale 2 puffs into the lungs every 6 (six) hours as needed.        Marland Kitchen allopurinol (ZYLOPRIM) 300 MG tablet Take 1 tablet (300 mg total) by mouth daily.  30 tablet  0  . amLODipine (NORVASC) 10 MG tablet Take 10 mg by mouth daily.        . calcium-vitamin D (OSCAL WITH D) 500-200 MG-UNIT per tablet Take 2 tablets by mouth daily.        . cetirizine (ZYRTEC) 10 MG tablet Take 10 mg by mouth daily.        . Cholecalciferol (VITAMIN D3) 1000 UNITS tablet Take 1,000 Units by mouth daily.        Marland Kitchen dextromethorphan-guaiFENesin (MUCINEX DM) 30-600 MG per 12 hr tablet Take 1 tablet by mouth every 12 (twelve) hours as needed.        Marland Kitchen LORazepam (ATIVAN) 1 MG tablet Take 1 tablet (1 mg total) by mouth every 8 (eight) hours as needed for anxiety.  30 tablet  1  . mometasone (NASONEX) 50 MCG/ACT nasal spray 2 sprays by Nasal route daily.        . Mometasone  Furo-Formoterol Fum (DULERA) 100-5 MCG/ACT AERO Inhale into the lungs. 2 puffs twice a day       . Multiple Vitamins-Minerals (CENTURY SENIOR FORMULA PO) Take 1 tablet by mouth daily.        Marland Kitchen omeprazole (PRILOSEC) 20 MG capsule Take 20 mg by mouth daily.       . predniSONE (DELTASONE) 20 MG tablet Take 10 mg by mouth daily.       . Thiamine HCl (VITAMIN B-1) 100 MG tablet Take 100 mg by mouth daily.        . valsartan (DIOVAN) 160 MG tablet Take 160 mg by mouth daily.        Marland Kitchen DISCONTD: sertraline (ZOLOFT) 100 MG tablet Take 100 mg by mouth daily.        . predniSONE (DELTASONE) 5 MG tablet Uses as directed  14 tablet  0  . DISCONTD: amoxicillin (AMOXIL) 500 MG capsule Take 500 mg by mouth 3 (three) times daily.        Marland Kitchen DISCONTD: amoxicillin-clavulanate (AUGMENTIN) 875-125 MG per tablet Take 1 tablet by mouth 2 (two) times daily.        Marland Kitchen DISCONTD: HYDROcodone-acetaminophen (NORCO)  5-325 MG per tablet 1-2 tablets every 4 hrs as needed

## 2011-05-05 NOTE — Telephone Encounter (Signed)
Spoke w/ pt and she states her r foot is swollen and her ankles are purple, hurts to walk on it. Pt was advised she needs to call her pulmonologist to be seen. Since VS is not in pt will see PW at 9:45.

## 2011-05-05 NOTE — Patient Instructions (Signed)
Elevate R ankle, place ace wrap or ankle support See your primary Care MD if this problem continues We will obtain an xray of the ankle Stay off feet, no work for 48hours Keep next scheduled visit with Dr Craige Cotta

## 2011-05-13 NOTE — Assessment & Plan Note (Signed)
[facility]                                 ON-CALL NOTE   GRIZEL, VESELY                    MRN:          [id number]  DATE:09/12/2007                            DOB:          July 03, 1941    This is a patient of Dr. Alwyn Ren.   The patient states that she did not feel well yesterday and now she has  nasal drainage that is yellow and she thinks she has a sinus infection  and has to go to work tonight and wonders if we can call in an  antibiotic.  She denies any specific fever or face pain but just feels  malaise, has some red and watery eyes also.   I discussed with her to try Mucinex-D and that antibiotics may not even  be appropriate at this time.  And, if she does have a fever she should  probably stay out of work and follow up with Dr. Alwyn Ren as appropriate.  She can also use nasal saline washes for sinus infection clearing.     Neta Mends. Panosh, MD  Electronically Signed    WKP/MedQ  DD: 09/12/2007  DT: 09/12/2007  Job #: [id number]

## 2011-05-13 NOTE — Assessment & Plan Note (Signed)
[facility]                                 ON-CALL NOTE   Lisa Larson, Lisa Larson                    MRN:          [id number]  DATE:12/12/2007                            DOB:          06-15-1941    PRIMARY CARE PHYSICIAN:  Dr. Marga Melnick.   The patient is calling because she has a cold and she wants antibiotics  called in.   I advised her that we cannot call in medicine over the phone. She could  be seen at the [facility] urgent care today or see Dr. Alwyn Ren tomorrow  for evaluation.     Jeffrey A. Tawanna Cooler, MD  Electronically Signed    JAT/MedQ  DD: 12/12/2007  DT: 12/13/2007  Job #: [id number]

## 2011-05-13 NOTE — Assessment & Plan Note (Signed)
[facility]                            CARDIOLOGY OFFICE NOTE   JALANI, CULLIFER                    MRN:          [id number]  DATE:11/03/2008                            DOB:          1941/12/26    PRIMARY CARE PHYSICIAN:  Titus Dubin. Alwyn Ren, MD, FACP, FCCP   HISTORY OF PRESENT ILLNESS:  [name] is a pleasant 70 year old  Caucasian female with a past medical history significant for labile  essential hypertension, depression, asthma and remote tobacco abuse, as  well as alcohol use who presents for followup today in our Cardiology  office.  She was initially seen on August 24, 2008, for further  evaluation of her labile hypertension.  During that visit, the patient  was taking clonidine; however, was having severe dry mouth and dental  problems that were felt to be secondary to this.  I elected during that  visit to taper her off with the clonidine and start her on Norvasc 5 mg  once daily.  The patient called our office several weeks later to let us  know her blood pressure was still elevated, so we had increased her  Norvasc to 10 mg once daily.  She has done well since that visit;  however, she did had a fall at work where she tripped over pipe and  fractured her arm.  She has been followed for this injury by her other  doctors.  She has most recently seen in the office of Dr. Alwyn Ren by Dr.  Beverely Low for complaints of sinus congestion and possible sinus infection.  The patient also complains today of gout in her right foot.  She has no  other complaints at this time.  She denies any dizziness, palpitations,  chest pain, shortness of breath, near-syncope, syncope, orthopnea, PND  or lower extremity edema.   PAST MEDICAL HISTORY:  Unchanged and is significant for hypertension,  depression, asthma, and remote tobacco abuse.   REVIEW OF SYSTEMS:  As stated in the history present illness is  otherwise negative   PHYSICAL EXAMINATION:  VITAL  SIGNS:  Blood pressure 110/66, pulse 78 and  regular, and respirations 12 and unlabored.  GENERAL:  She is a pleasant middle-aged Caucasian female in no acute  distress.  She is alert and oriented x3.  NECK:  No JVD.  No carotid bruits.  No lymphadenopathy.  No thyromegaly.  SKIN:  Warm and dry.  PSYCHIATRIC:  Mood and affect are appropriate.  LUNGS:  Clear to auscultation bilaterally without wheezes, rhonchi, or  crackles noted.  CARDIOVASCULAR:  Regular rate and rhythm without murmurs, gallops, or  rubs noted.  ABDOMEN:  Soft, nontender, and nondistended.  Bowel sounds are present.  EXTREMITIES:  No evidence of edema.  Pulses are 2+ in all extremities.   DIAGNOSTIC STUDIES:  Surface echocardiogram performed on September 06, 2008, showed normal left ventricular systolic function with an ejection  fraction of 60%.  There were no left ventricular regional wall motion  abnormalities noted.  Doppler parameters were consistent with abnormal  left ventricular relaxation.  There were no significant valvular  abnormalities noted.  The  chambers were all in normal size.   ASSESSMENT AND PLAN:  This is a pleasant 70 year old Caucasian female  with a past medical history significant for labile hypertension, who  comes in for followup today.  Her blood pressure is well controlled  today.  She does not describe any symptoms that are suggestive of  extremely high blood pressures or extremely low blood pressures.  She  denies any dizziness, headaches, visual changes, or near-syncopal  episodes.  The patient has never had chest pain or shortness of breath.  I think she is doing well currently with her most recent medication  change which was the addition of Norvasc once daily.  I have reviewed  her echocardiogram with her today which is normal, except for a mild  relaxation abnormality, which is most likely secondary to long-standing  hypertension.  The most appropriate way to keep her from  developing  diastolic heart failure would be to adequately control her blood  pressure.  I feel that her blood pressure is well controlled at the  current time.  The patient does describe a recent gout exacerbation  which she has reported to her primary care physician.  She is asking  today if she can hold her hydrochlorothiazide secondary to the gout, and  I feel that this is appropriate.  The patient has been told in the past  by her rheumatologist that she should not take hydrochlorothiazide  during an episode of gout.  I will have her continue all of her other  medications.  I do not feel that she needs to be followed in our office  on a regular basis.  I have encouraged her to continue to follow with  Dr. Alwyn Ren in his primary care office.  She is aware that she can call  us if she has any change in her clinical status.     Lisa Carrow, MD  Electronically Signed    CM/MedQ  DD: 11/03/2008  DT: 11/03/2008  Job #: [id number]   cc:   Titus Dubin. Alwyn Ren, MD,FACP,FCCP

## 2011-05-13 NOTE — Assessment & Plan Note (Signed)
[facility]                                 ON-CALL NOTE   LYNNITA, SOMMA                    MRN:          [id number]  DATE:09/03/2008                            DOB:          January 17, 1941    The patient called concerning blood pressure medication.  She was given  a new medication amlodipine 5 mg 1 p.o. daily by Dr. Clifton James on Friday,  September 01, 2008, and had been monitoring her blood pressure throughout  the week and then found that it was still elevated above 160-170  systolic.  She called concerning this wanting to know if she needed to  take an extra dose.  She stated that Dr. Gibson Ramp nurse said that if  her blood pressure was greater than 180 systolic, then she would be okay  to take an additional 5 mg and Norvasc, however.  The patient did not do  so as her blood pressure was running 160s-170s and she was feeling bad.   I have advised the patient that she may take an additional 5 mg of  Norvasc this afternoon and increase her Norvasc to 5 mg p.o. daily and  keep track of her blood pressure.  The patient also states that she is  on Diovan 160 mg daily and I have advised her to continue that  medication as she was taking that as well.   The patient has a followup appointment with Dr. Clifton James on September 05, 2008, for blood pressure recheck and I have advised her to let him know  that we have advised her to go up on her Norvasc intermittently while  she is monitoring her blood pressure.      Bettey Mare. Lyman Bishop, NP  Electronically Signed      Verne Carrow, MD  Electronically Signed   KML/MedQ  DD: 09/03/2008  DT: 09/03/2008  Job #: [id number]

## 2011-05-13 NOTE — Assessment & Plan Note (Signed)
[facility]                            CARDIOLOGY OFFICE NOTE   Lisa Larson, Lisa Larson                    MRN:          [id number]  DATE:08/24/2008                            DOB:          1941-05-27    HISTORY OF PRESENT ILLNESS:  Lisa Larson is a pleasant 70 year old  Caucasian female with a past medical history significant for essential  hypertension, depression, asthma, and remote tobacco abuse, who presents  today for evaluation of labile hypertension.  The patient states that  she has had some adjustment in her medications recently that it seemed  to stabilize her blood pressure.  She has taken clonidine and verapamil  in the past, but has now been stabilized on clonidine along with Diovan.  The patient is accompanied by her daughter today, who states that she  has had some problems with her dental work and dry mouth.  Her oral  surgeon felt that her dry mouth was the cause of her dental problems and  this was most likely tied to clonidine.  The patient is concerned about  this and does wish to potentially eliminate clonidine from her medical  regimen, although it has been very beneficial to her recently.  She has  no other complaints at the current time other than feeling a little  dizzy when her blood pressure is low.  She does check her blood pressure  with a home blood pressure cuff.  She denies any chest pain, shortness  of breath, palpitations, near syncope, syncope, orthopnea, PND, or lower  extremity edema.   PAST MEDICAL HISTORY:  Significant for hypertension, depression, asthma,  and remote tobacco use.   PAST SURGICAL HISTORY:  Sinus surgery, tonsillectomy, and hysterectomy.   ALLERGIES:  PERCODAN, SULFA DRUGS, and ACE INHIBITORS, which cause  cough.   CURRENT MEDICATIONS:  1. Accolate 20 mg in the morning.  2. Clonidine 0.2 mg twice daily.  3. Omeprazole 20 mg once daily.  4. Allopurinol 300 mg once daily.  5. Zyrtec 1  tablet once daily.  6. Nasonex 50 mcg 2 sprays once daily.  7. Advair 50/250 mg 1 puff twice daily.  8. Os-Cal 500 Plus D 1 twice daily.  9. Vitamin D 1000 units once daily.  10.Sertraline 75 mg once daily.  11.Diovan 160 mg once daily.  12.Vitamin B1 100 mg once daily.  13.Multivitamins once daily.  14.Flaxseed oil twice daily.   SOCIAL HISTORY:  The patient remotely used tobacco, but has not smoked  in the last 20 years.  She does admit to drinking more than 2 glasses of  wine per night.  She denies any illicit drug use.  She is married and  works in the [employer] in the [employer] section.   FAMILY HISTORY:  The patient's father died at age 42 from a pulmonary  embolism and her mother died at age of 58 from dementia.  There is a  strong family history of high blood pressure in her brothers, mother,  and father.   REVIEW OF SYSTEMS:  As stated in the history of present illness.  It is  otherwise  negative.   PHYSICAL EXAMINATION:  GENERAL:  This is a pleasant middle-aged  Caucasian female in no acute distress.  Blood pressure 112/64, pulse 60  and regular, and respirations 12 and unlabored.  NECK:  No JVD.  No carotid bruits.  No lymphadenopathy.  No thyromegaly.  SKIN:  Warm and dry.  OROPHARYNX:  Clear without exudate.  LUNGS:  Clear to auscultation bilaterally without wheezes, rhonchi, or  crackles noted.  CARDIOVASCULAR:  Regular rate and rhythm without murmurs, gallops, or  rubs noted.  ABDOMEN:  Soft and nontender.  Bowel sounds present.  EXTREMITIES:  No evidence of edema.  Pulses are 2+ in the bilateral  dorsalis pedis and posterior tibial arteries.   DIAGNOSTIC STUDIES:  1. A 12-lead electrocardiogram reviewed in our office today shows      normal sinus rhythm with a ventricular rate of 62 beats per minute      and a corrected QT interval of 399 milliseconds.  There are no      ischemic changes noted.  2. Most recent fasting lipid profile performed on  08/04/2008 was a      total cholesterol 261, triglycerides 475, HDL cholesterol 49, and      LDL cholesterol 109.   ASSESSMENT AND PLAN:  This is a pleasant 70 year old Caucasian female  with a past medical history significant for essential hypertension that  has been labile in the recent past; however, it is now stabilized on two-  drug therapy.  As stated above, the patient wishes to stop using  clonidine secondary to a dry mouth that has been causing her significant  dental issues and is felt to be secondary to clonidine.  I will taper  her off the clonidine and will start Norvasc 5 mg once daily to take  along with her Diovan 160 mg once daily.  She is encouraged to continue  to follow her blood pressures at home and document these.  Given her  longstanding hypertension, I will perform a surface echocardiogram to  assess her left ventricular function, left ventricular wall thickness,  and also to rule out any significant valvular disease.  I will plan on  seeing her back in the office in 4 weeks to review the results of her  echocardiogram and to see  how her blood pressure has been controlled.  She is to alert our office  should she have swings in her blood pressure or develop headaches,  visual changes, chest pain, or dizziness.     Verne Carrow, MD  Electronically Signed    CM/MedQ  DD: 08/24/2008  DT: 08/25/2008  Job #: [id number]   cc:   Titus Dubin. Alwyn Ren, MD,FACP,FCCP  Luis Abed, MD, [facility]

## 2011-05-13 NOTE — Assessment & Plan Note (Signed)
[facility]                         GASTROENTEROLOGY OFFICE NOTE   JENNIER, SCHISSLER                    MRN:          [id number]  DATE:11/09/2007                            DOB:          10-21-1941    Ms. Cogan is a very nice 70 year old patient of Dr. Alwyn Ren who is  here today for evaluation of gastroesophageal reflux disease and pill  dysphagia.  In September, while having an upper respiratory infection,  Dr. Alwyn Ren put her on Nasonex.  The Nasonex is rather a large pill, and  patient had several episodes of choking.  She also has occasional  dysphagia to solids, but never had a food impaction or regurgitation of  her food.  Her brother, who is our patient, has a history of esophageal  stricture, which has required esophageal dilatation in the past.  Ms.  Girdler saw Dr. Lucie Leather in the past for allergies.  He determined that  she had gastroesophageal reflux, and put her on omeprazole 20 mg a day,  which she has taken now on an everyday basis.  Most recently, Ms.  Naas has awakened during the night with food and acid backing up  her throat.   We saw Ms. Gimbel in 2000 for screening colonoscopy, which was a  normal exam, and she was asked to come back in 10 years for recall  colonoscopy.  This would be in October 2010.  She has had occasional  bright red blood per rectum when she is constipated, otherwise her  bowels have been regular.   MEDICATIONS:  1. Accolate 20 mg p.o. daily.  2. Lorazepam 1 mg 1/2 tablet q.8 hours.  3. Clonidine 2 mg p.o. b.i.d.  4. Omeprazole 20 mg q.p.m.  5. Allopurinol 300 mg p.o. daily.  6. Zyrtec 1 nightly.  7. Nasonex 50 mcg 2 sprays.  8. Advair.  9. Os-Cal.  10.Vitamin D.  11.Citrulline HCL 50 mg q.a.m.  12.Tylenol p.r.n.   PAST MEDICAL HISTORY:  Significant for:  1. High blood pressure.  2. Heart rhythm problem.  3. Asthmatic bronchitis.  4. Hyperlipidemia.  5. Arthritic complaints.  6.  Anxiety/panic disorder.  7. Allergies.   FAMILY HISTORY:  Positive for history of lymphoma in her brother,  esophageal stricture in her brother, diabetes in grandmother and  brother.   SOCIAL HISTORY:  Married with 2 children.  She does not smoke, and  drinks alcohol socially.   REVIEW OF SYSTEMS:  Positive for allergies, urination changes, arthritic  complaints, skin rashes, anemia, excessive thirst, headaches, leakage of  urine.   PHYSICAL EXAMINATION:  Blood pressure 138/80.  Pulse 78.  Weight 158  pounds.  She was alert, oriented, and in no distress.  Her voice was normal.  Oral cavity was normal.  NECK:  Supple without adenopathy.  LUNGS:  Clear to auscultation.  COR:  With normal S1 and normal S2.  ABDOMEN:  Somewhat protuberant.  She was unable to relax.  Bowel sounds  were normoactive.  There was no tenderness.  Liver edge at costal  margin.  No scars.  RECTAL EXAM:  Not done.  EXTREMITIES:  No edema.  IMPRESSION:  A 70 year old white female history of gastroesophageal  reflux with recent exacerbation of nocturnal regurgitation not  controlled completely on omeprazole.  She has had pill dysphagia only  with large pills.  I suspect she may have either a mild esophageal  stricture, or possibly decreased esophageal motility, although the solid  food dysphagia, such as more of an anatomic obstruction than the  motility disorder.  Some of her medications could cause esophageal  dysmotility such as citrulline.  Her brother, who is our patient, had a  benign esophageal stricture.   PLAN:  1. Upper endoscopy scheduled to rule out Barrett's esophagus to obtain      appropriate biopsies and to proceed with esophageal dilatation if      indicated.  2. Anti-reflux measures.  It is a problem for the patient since she      comes back from work at 9 p.m., and usually has supper at that      time, and goes to bed shortly afterwards.  She will try to modify      her eating habits  at this point.  3. Take Prilosec 20 mg in the afternoon.     Hedwig Morton. Juanda Chance, MD  Electronically Signed    DMB/MedQ  DD: 11/09/2007  DT: 11/09/2007  Job #: [id number]   cc:   Titus Dubin. Alwyn Ren, MD,FACP,FCCP

## 2011-05-16 NOTE — Assessment & Plan Note (Signed)
[facility]                                   ON-CALL NOTE   MURL, ZOGG                    MRN:          [id number]  DATE:11/07/2006                            DOB:          03/29/1941    TELEPHONE TRIAGE NOTE:  Time received is 1:23 p.m.  Caller Janet Berlin.  She sees Dr. Alwyn Ren.  Telephone [phone].  Patient reports 2  days of burning on urination, also increased pressure to urinate and  frequency of urination, nausea or she is drinking plenty of water and using  as a standard.  My response is to call in __________  twice a day for the  next 5 days to [facility] at [phone].    ______________________________  Tera Mater. Clent Ridges, MD    SAF/MedQ  DD: 11/07/2006  DT: 11/07/2006  Job #: [id number]   cc:   Titus Dubin. Alwyn Ren, MD,FACP,FCCP

## 2011-05-16 NOTE — Assessment & Plan Note (Signed)
[facility]                          [place] OFFICE NOTE   Lisa Larson, Lisa Larson                    MRN:          [id number]  DATE:09/22/2006                            DOB:          Dec 06, 1941    Had a complete physical examination on September 22, 2006.  She does have  significant arthritic symptoms and had been taking Diclofenac.  Previously  she had been on Mobic.  She does have esophageal reflux and has been on  proton pump inhibitors.  Remotely, she had also been on Vioxx.   I have discussed with her the [facility] Box warnings associated with  nonsteroidal agents.  These include both increased cardiovascular risks as  well as GI risks.   She feels that her arthritis is compromising her ability to function at  work.  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   On exam she exhibits decreased range of motion of the left knee with slight  effusion.  She also has a slight limp to her gait.   Her sed rate was normal at 19.  RA factor was negative at less than 20.  Uric acid was elevated at 7.5.  She is not on hydrochlorothiazide.   Rheumatology consultation will be pursued to see if she is a candidate for  the intra-articular injections to the knees.  It is recommended that she  take Arthritis Strength Tylenol at bedtime and 1-3 baby aspirin in the  morning.  Uric acid will be addressed at this point with purine restriction;  a copy of the diet recommendations will be sent to her.       Titus Dubin. Alwyn Ren, MD,FACP,FCCP      [facility]/MedQ  DD:  09/29/2006  DT:  09/30/2006  Job #:  [id number]

## 2011-05-21 ENCOUNTER — Telehealth: Payer: Self-pay

## 2011-05-21 MED ORDER — TRAMADOL HCL 50 MG PO TABS: ORAL_TABLET | ORAL | Status: DC

## 2011-05-21 NOTE — Telephone Encounter (Signed)
The "pop" she heard  suggests she is dealing with tendon damage which would not show up on a x-ray. Please renew the tramadol and have her schedule an appointment. #30, 1/2 -1 q 6 hrs prn

## 2011-05-21 NOTE — Telephone Encounter (Signed)
Pt called would like to get rx for Tramadol for her ongoing leg pain due to arthritis called into pharmacy also has scheduled appt to have you look at ankle says she think she heard something pop and know ankle is alittle swollen and having trouble walking on it should she have xray first.   [facility].

## 2011-05-22 ENCOUNTER — Encounter: Payer: Self-pay | Admitting: Internal Medicine

## 2011-05-22 ENCOUNTER — Ambulatory Visit (INDEPENDENT_AMBULATORY_CARE_PROVIDER_SITE_OTHER): Payer: Managed Care, Other (non HMO) | Admitting: Internal Medicine

## 2011-05-22 DIAGNOSIS — IMO0002 Reserved for concepts with insufficient information to code with codable children: Secondary | ICD-10-CM

## 2011-05-22 DIAGNOSIS — M1710 Unilateral primary osteoarthritis, unspecified knee: Secondary | ICD-10-CM

## 2011-05-22 DIAGNOSIS — R609 Edema, unspecified: Secondary | ICD-10-CM

## 2011-05-22 DIAGNOSIS — M7120 Synovial cyst of popliteal space [Baker], unspecified knee: Secondary | ICD-10-CM

## 2011-05-22 MED ORDER — TRAMADOL HCL 50 MG PO TABS: ORAL_TABLET | ORAL | Status: DC

## 2011-05-22 NOTE — Progress Notes (Signed)
Subjective:    Patient ID: Lisa Larson, female    DOB: September 21, 1941, 70 y.o.   MRN: [id number]  HPI #1  Edema especially R ankle                                                                                                                                                         Onset:10/2011 ; ? Due to prednisone for Eosinophilic PNA Constitutional: no Fatigue; sleep disturbance   (no apnea , but some snoring as per husband)                                                        Cardiovascular: no Palpitations; tachycardia; claudication; paroxysmal nocturnal dyspnea Pulmonary:no Cough; sputum reduction; exertional dyspnea (improved on steroids) Endocrinologic:Weight change up 22# on steroids;  temperature intolerance to heat; no skin/hair/nail changes Possible triggers:no Salt  excess; new medications ( see comments re steroids)  #2 Extremity pain Onset:02/22 Trigger/injury:she felt a "pop" behind R knee when her son tried to help her up from couch Constitutional: no Fever, chills, sweats Musculoskeletal:no Muscle cramp or pain in R calf;some chronic  joint stiffness w/o redness or swelling Skin:no  Rash, color change Neuro: Weakness in both legs;no  incontinence (stool/urine); intermittent nonspecific numbness and tingling in legs; no tremor Heme:no Lymphadenopathy; increased  bruising . Bruising appeared 6 weeks ago. Treatment/response:ACE bandage with benefit  . Xray negative of ankle 05/07. Tramadol helped knee pain   Review of Systems     Objective:   Physical Exam General appearance:Slight Cushingnoid facies; in no acute distress.  Eyes: No conjunctival inflammation or scleral icterus is present.    Heart:  Normal rate and regular rhythm. S1 and S2 normal without gallop, murmur, click, rub or other extra sounds   Neck : normal thyroid; no NVD @ 15 degrees.  Lungs:Chest clear to auscultation; no wheezes, rhonchi,rales ,or rubs present.No increased work of breathing.    Abdomen: bowel sounds normal, soft and non-tender without masses, organomegaly or hernias noted.  No guarding or rebound . No HJR  Skin:Warm & dry.  Intact without suspicious lesions or rashes ; no jaundice or tenting  Lymphatic: No lymphadenopathy is noted about the head, neck, or  axillary  areas.    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  There is fusiform enlargement of her knees. She cries out in pain with flexion of the right knee. There is tenderness in the right popliteal space. She does have some edema at the right ankle. With range of motion of the ankle she describes pain in her knee. Homans sign is  negative. She limps  On R knee & ankle & uses a cane. She requires help onto table.          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  Clinically there is no evidence of any cardiac insufficiency; hepatic or renal etiologies are doubtful.  Plan: Renal and hepatic function will be checked. She is to continue Tramadol  every 6 hours as needed. Referral will be made to an orthopedist to evaluate the knee changes.

## 2011-05-22 NOTE — Patient Instructions (Signed)
You should remain out of work until seen by the [employer]

## 2011-05-23 ENCOUNTER — Telehealth: Payer: Self-pay

## 2011-05-23 LAB — HEPATIC FUNCTION PANEL
ALT: 31 U/L (ref 0–35)
AST: 32 U/L (ref 0–37)
Albumin: 4.2 g/dL (ref 3.5–5.2)
Alkaline Phosphatase: 58 U/L (ref 39–117)
Bilirubin, Direct: 0.1 mg/dL (ref 0.0–0.3)
Total Bilirubin: 0.6 mg/dL (ref 0.3–1.2)
Total Protein: 7.2 g/dL (ref 6.0–8.3)

## 2011-05-23 LAB — CREATININE, SERUM: Creatinine, Ser: 1 mg/dL (ref 0.4–1.2)

## 2011-05-23 LAB — BUN: BUN: 17 mg/dL (ref 6–23)

## 2011-05-23 NOTE — Telephone Encounter (Signed)
Patient states she was referred to Dr.Dean by our office and has an appointment on Tuesday at 3:45, patient would like any info that they may need to be faxed to them

## 2011-05-23 NOTE — Progress Notes (Signed)
Addended byPecola Lawless on: 05/23/2011 09:33 AM   Modules accepted: Orders

## 2011-05-28 NOTE — Telephone Encounter (Signed)
All related notes were faxed when patient was referred/rh 05-28-11

## 2011-05-29 ENCOUNTER — Encounter: Payer: Self-pay | Admitting: Pulmonary Disease

## 2011-05-29 ENCOUNTER — Ambulatory Visit (INDEPENDENT_AMBULATORY_CARE_PROVIDER_SITE_OTHER): Payer: Managed Care, Other (non HMO) | Admitting: Pulmonary Disease

## 2011-05-29 VITALS — BP 136/88 | HR 81 | Temp 98.0°F | Ht 61.0 in | Wt 172.4 lb

## 2011-05-29 DIAGNOSIS — J45909 Unspecified asthma, uncomplicated: Secondary | ICD-10-CM

## 2011-05-29 DIAGNOSIS — J8289 Other pulmonary eosinophilia, not elsewhere classified: Secondary | ICD-10-CM

## 2011-05-29 DIAGNOSIS — F1010 Alcohol abuse, uncomplicated: Secondary | ICD-10-CM | POA: Insufficient documentation

## 2011-05-29 MED ORDER — PREDNISONE 5 MG PO TABS: ORAL_TABLET | ORAL | Status: DC

## 2011-05-29 MED ORDER — MOMETASONE FURO-FORMOTEROL FUM 100-5 MCG/ACT IN AERO: INHALATION_SPRAY | RESPIRATORY_TRACT | Status: DC

## 2011-05-29 NOTE — Assessment & Plan Note (Signed)
Pt's son reports, and pt confirms, that she will drink at least one bottle of wine per day.  Advised her to use caution with her alcohol consumption given the multiple medications she is taking.  Advised her to discuss further with her PCP.

## 2011-05-29 NOTE — Patient Instructions (Signed)
Prednisone 5 mg pills: one pill daily for two weeks, then one pill every other day for two weeks, then stop prednisone Will schedule CT chest in 6 weeks Follow up in 6 weeks after CT chest done

## 2011-05-29 NOTE — Progress Notes (Signed)
Subjective:    Patient ID: Lisa Larson, female    DOB: 27-Jul-1941, 70 y.o.   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  She is not having any trouble with her breathing.  She denies cough, chest pain, fever, rash, sweats, sputum, wheeze, or hemoptysis.  She was seen by ortho and told she has a ruptured bakers cyst.  She feels like dulera helps her breathing.  She has not needed to use proair recently.  Her son reports that Lisa Larson drinks at least one bottle of wine per day, every day.  Lisa Larson does not thinks this is a problem.  She is not sure if she gets shakes, since she hasn't gone a day in years without drinking at least a bottle of wine.  Past Medical History  Diagnosis Date  . 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 IGE 282 from 02-05-11  . Asthma   . Hypertension   . Osteopenia     BMD t score-1.6 at L femoral neck 11-27-2009, s/p fosamax x 5 years  . GERD (gastroesophageal reflux disease)   . Depression   . Hyperlipidemia   . Anxiety   . Baker's cyst, ruptured 2012    right     Family History  Problem Relation Age of Onset  . Arthritis Father   . Rheum arthritis Father   . Hypertension Father   . Hypertension Mother   . Alzheimer's disease Mother   . Hypertension Brother   . Diabetes Brother   . Cancer Son     laryngeal     History   Social History  . Marital Status: Married    Spouse Name: N/A    Number of Children: N/A  . Years of Education: N/A   Occupational History  . grocery store clerk    Social History Main Topics  . Smoking status: Former Smoker -- 1.0 packs/day for 15 years    Types: Cigarettes    Quit date: 12/29/1968  . Smokeless tobacco: Never Used  . Alcohol Use: 7.0 oz/week    14 drink(s) per week  . Drug Use: No  . Sexually Active: Not on file   Other Topics Concern    . Not on file   Social History Narrative  . No narrative on file     Allergies  Allergen Reactions  . Benazepril Hcl   . Oxycodone-Aspirin   . Rofecoxib   . Sulfonamide Derivatives      Outpatient Prescriptions Prior to Visit  Medication Sig Dispense Refill  . albuterol (PROAIR HFA) 108 (90 BASE) MCG/ACT inhaler Inhale 2 puffs into the lungs every 6 (six) hours as needed.        Marland Kitchen allopurinol (ZYLOPRIM) 300 MG tablet Take 300 mg by mouth. 1/2 BY MOUTH DAILY       . amLODipine (NORVASC) 10 MG tablet Take 10 mg by mouth daily.        . calcium-vitamin D (OSCAL WITH D) 500-200 MG-UNIT per tablet Take 2 tablets by mouth daily.        . cetirizine (ZYRTEC) 10 MG tablet Take 10 mg by mouth daily.        . Cholecalciferol (VITAMIN D3) 1000 UNITS tablet Take 1,000 Units by mouth daily.        Marland Kitchen dextromethorphan-guaiFENesin (MUCINEX DM) 30-600 MG per 12 hr tablet Take  1 tablet by mouth every 12 (twelve) hours as needed.        Marland Kitchen LORazepam (ATIVAN) 1 MG tablet Take 1 tablet (1 mg total) by mouth every 8 (eight) hours as needed for anxiety.  30 tablet  1  . mometasone (NASONEX) 50 MCG/ACT nasal spray 2 sprays by Nasal route daily.        . Mometasone Furo-Formoterol Fum (DULERA) 100-5 MCG/ACT AERO Inhale into the lungs. 2 puffs twice a day      . Multiple Vitamins-Minerals (CENTURY SENIOR FORMULA PO) Take 1 tablet by mouth daily.        Marland Kitchen omeprazole (PRILOSEC) 20 MG capsule Take 20 mg by mouth daily.       . predniSONE (DELTASONE) 20 MG tablet Take 20 mg by mouth. 1/2 By mouth daily      . sertraline (ZOLOFT) 100 MG tablet TAKE ONE TABLET BY MOUTH DAILY  90 tablet  1  . Thiamine HCl (VITAMIN B-1) 100 MG tablet Take 100 mg by mouth daily.        . traMADol (ULTRAM) 50 MG tablet 1/2-1 every 6 hours prn  60 tablet  1  . valsartan (DIOVAN) 160 MG tablet Take 160 mg by mouth daily.        Marland Kitchen allopurinol (ZYLOPRIM) 300 MG tablet Take 1 tablet (300 mg total) by mouth daily.  30 tablet  0  .  famotidine (PEPCID) 20 MG tablet Take 20 mg by mouth daily.          Review of Systems     Objective:   Physical Exam  Filed Vitals:   05/29/11 1021  BP: 136/88  Pulse: 81  Temp: 98 F (36.7 C)  TempSrc: Oral  Height: 5\' 1"  (1.549 m)  Weight: 172 lb 6.4 oz (78.2 kg)  SpO2: 92%    General: normal appearance and healthy appearing.  Nose: no deformity, discharge, inflammation, or lesions  Mouth: no exudate  Neck: no masses, thyromegaly, or abnormal cervical nodes  Lungs: no wheeze or rales  Heart: regular rate and rhythm, S1, S2 without murmurs, rubs, gallops, or clicks  Abdomen: bowel sounds positive; abdomen soft and non-tender without masses, or organomegaly  Extremities: no clubbing, cyanosis, edema, or deformity noted  Neurologic: normal CN II-XII and strength normal.  Skin: intact without lesions or rashes  Cervical Nodes: no significant adenopathy      Assessment & Plan:   EOSINOPHILIC PNEUMONIA She is not having significant respiratory complaints.  Will try to wean off prednisone over the next one month.  Will then repeat CT chest off prednisone.  Advised her to call if she notices worsening symptoms.  ASTHMA Will refill dulera.  Alcohol abuse, daily use Pt's son reports, and pt confirms, that she will drink at least one bottle of wine per day.  Advised her to use caution with her alcohol consumption given the multiple medications she is taking.  Advised her to discuss further with her PCP.    Updated Medication List Outpatient Encounter Prescriptions as of 05/29/2011  Medication Sig Dispense Refill  . albuterol (PROAIR HFA) 108 (90 BASE) MCG/ACT inhaler Inhale 2 puffs into the lungs every 6 (six) hours as needed.        Marland Kitchen allopurinol (ZYLOPRIM) 300 MG tablet Take 300 mg by mouth. 1/2 BY MOUTH DAILY       . amLODipine (NORVASC) 10 MG tablet Take 10 mg by mouth daily.        . calcium-vitamin D (OSCAL  WITH D) 500-200 MG-UNIT per tablet Take 2 tablets by mouth  daily.        . cetirizine (ZYRTEC) 10 MG tablet Take 10 mg by mouth daily.        . Cholecalciferol (VITAMIN D3) 1000 UNITS tablet Take 1,000 Units by mouth daily.        Marland Kitchen dextromethorphan-guaiFENesin (MUCINEX DM) 30-600 MG per 12 hr tablet Take 1 tablet by mouth every 12 (twelve) hours as needed.        Marland Kitchen LORazepam (ATIVAN) 1 MG tablet Take 1 tablet (1 mg total) by mouth every 8 (eight) hours as needed for anxiety.  30 tablet  1  . mometasone (NASONEX) 50 MCG/ACT nasal spray 2 sprays by Nasal route daily.        . Mometasone Furo-Formoterol Fum (DULERA) 100-5 MCG/ACT AERO 2 puffs twice a day  1 Inhaler  11  . Multiple Vitamins-Minerals (CENTURY SENIOR FORMULA PO) Take 1 tablet by mouth daily.        Marland Kitchen omeprazole (PRILOSEC) 20 MG capsule Take 20 mg by mouth daily.       . sertraline (ZOLOFT) 100 MG tablet TAKE ONE TABLET BY MOUTH DAILY  90 tablet  1  . Thiamine HCl (VITAMIN B-1) 100 MG tablet Take 100 mg by mouth daily.        . traMADol (ULTRAM) 50 MG tablet 1/2-1 every 6 hours prn  60 tablet  1  . valsartan (DIOVAN) 160 MG tablet Take 160 mg by mouth daily.        Marland Kitchen DISCONTD: Mometasone Furo-Formoterol Fum (DULERA) 100-5 MCG/ACT AERO Inhale into the lungs. 2 puffs twice a day      . DISCONTD: predniSONE (DELTASONE) 20 MG tablet Take 20 mg by mouth. 1/2 By mouth daily      . predniSONE (DELTASONE) 5 MG tablet One pill daily for two weeks, then one pill every other day for two weeks  21 tablet  0  . DISCONTD: allopurinol (ZYLOPRIM) 300 MG tablet Take 1 tablet (300 mg total) by mouth daily.  30 tablet  0  . DISCONTD: famotidine (PEPCID) 20 MG tablet Take 20 mg by mouth daily.

## 2011-05-29 NOTE — Assessment & Plan Note (Signed)
Will refill dulera.

## 2011-05-29 NOTE — Assessment & Plan Note (Signed)
She is not having significant respiratory complaints.  Will try to wean off prednisone over the next one month.  Will then repeat CT chest off prednisone.  Advised her to call if she notices worsening symptoms.

## 2011-06-09 ENCOUNTER — Other Ambulatory Visit: Payer: Self-pay | Admitting: Internal Medicine

## 2011-06-27 ENCOUNTER — Other Ambulatory Visit: Payer: Self-pay | Admitting: Internal Medicine

## 2011-07-10 ENCOUNTER — Ambulatory Visit (INDEPENDENT_AMBULATORY_CARE_PROVIDER_SITE_OTHER)
Admission: RE | Admit: 2011-07-10 | Discharge: 2011-07-10 | Disposition: A | Discharge status: Home / Self Care | Payer: Managed Care, Other (non HMO) | Source: Ambulatory Visit | Attending: Pulmonary Disease | Admitting: Pulmonary Disease

## 2011-07-10 DIAGNOSIS — J8289 Other pulmonary eosinophilia, not elsewhere classified: Secondary | ICD-10-CM

## 2011-07-10 MED ORDER — IOHEXOL 300 MG/ML  SOLN
80.0000 mL | Freq: Once | INTRAMUSCULAR | Status: AC | PRN
  Administered 2011-07-10: 80 mL via INTRAVENOUS

## 2011-07-11 ENCOUNTER — Other Ambulatory Visit: Payer: Self-pay | Admitting: Internal Medicine

## 2011-07-11 MED ORDER — LORAZEPAM 1 MG PO TABS: 1.0000 mg | ORAL_TABLET | Freq: Three times a day (TID) | ORAL | Status: DC | PRN

## 2011-07-11 NOTE — Telephone Encounter (Signed)
RX sent to pharmacy  

## 2011-07-17 ENCOUNTER — Other Ambulatory Visit: Payer: Self-pay | Admitting: Internal Medicine

## 2011-07-17 MED ORDER — OMEPRAZOLE 20 MG PO CPDR: 20.0000 mg | DELAYED_RELEASE_CAPSULE | Freq: Every day | ORAL | Status: DC

## 2011-07-17 NOTE — Telephone Encounter (Signed)
Called in just in case rx was not received 05/2011

## 2011-07-18 ENCOUNTER — Telehealth: Payer: Self-pay | Admitting: Pulmonary Disease

## 2011-07-18 NOTE — Telephone Encounter (Signed)
Pt aware of results. Tay Whitwell, CMA  

## 2011-07-18 NOTE — Telephone Encounter (Signed)
Called spoke with patient who had CT chest 7.12.12, informed her that VS has been out of the office this week.  Patient voiced concern that she has had to wait a week for these results and requests another physician read her CT.  I informed patient that i cannot guarantee that another physician will do so, but she requested we try this anyway.  Will forward to doc of the day.  Dr Vassie Loll please advise, thanks!

## 2011-07-18 NOTE — Telephone Encounter (Signed)
Air space disease or pneumonia has resolved on CT scan Dr Craige Cotta to discuss further plan

## 2011-07-22 ENCOUNTER — Telehealth: Payer: Self-pay | Admitting: Pulmonary Disease

## 2011-07-22 DIAGNOSIS — J8289 Other pulmonary eosinophilia, not elsewhere classified: Secondary | ICD-10-CM

## 2011-07-22 NOTE — Telephone Encounter (Signed)
CT chest reviewed, and shows improvement in ASD.  Will have my nurse schedule ROV to review.

## 2011-07-24 ENCOUNTER — Other Ambulatory Visit: Payer: Self-pay | Admitting: Cardiovascular Disease

## 2011-07-25 NOTE — Telephone Encounter (Signed)
Pt rx refill

## 2011-07-25 NOTE — Telephone Encounter (Signed)
[place] pt. 

## 2011-07-29 NOTE — Telephone Encounter (Signed)
Spoke w/ pt and she is coming in tomorrow at 1:30 to discuss these results. Pt is aware of apt date and time

## 2011-07-29 NOTE — Telephone Encounter (Signed)
lmomtcb x1 

## 2011-07-29 NOTE — Telephone Encounter (Signed)
Returning call.

## 2011-07-30 ENCOUNTER — Encounter: Payer: Self-pay | Admitting: Pulmonary Disease

## 2011-07-30 ENCOUNTER — Ambulatory Visit (INDEPENDENT_AMBULATORY_CARE_PROVIDER_SITE_OTHER): Payer: Managed Care, Other (non HMO) | Admitting: Pulmonary Disease

## 2011-07-30 DIAGNOSIS — J8289 Other pulmonary eosinophilia, not elsewhere classified: Secondary | ICD-10-CM

## 2011-07-30 DIAGNOSIS — J45909 Unspecified asthma, uncomplicated: Secondary | ICD-10-CM

## 2011-07-30 NOTE — Assessment & Plan Note (Signed)
She is to continue dulera and prn albuterol.

## 2011-07-30 NOTE — Progress Notes (Signed)
Subjective:    Patient ID: Lisa Larson, female    DOB: Sep 25, 1941, 70 y.o.   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  She has been off prednisone since July 24.  She has not had cough, wheeze, sputum, fever, skin rash, hemoptysis, gland swelling, dyspnea, or chest pain.  She continues to use dulera.  She has not needed to use proair.  She has noticed some irritation in her right eye in the morning.  Review of Systems     Objective:   Physical Exam  BP 112/68  Pulse 84  Temp(Src) 96.3 F (35.7 C) (Oral)  Ht 5\' 1"  (1.549 m)  Wt 167 lb (75.751 kg)  BMI 31.55 kg/m2  SpO2 97%  General: normal appearance and healthy appearing.  Nose: no deformity, discharge, inflammation, or lesions  Mouth: no exudate  Neck: no masses, thyromegaly, or abnormal cervical nodes  Lungs: no wheeze or rales  Heart: regular rate and rhythm, S1, S2 without murmurs, rubs, gallops, or clicks  Abdomen: bowel sounds positive; abdomen soft and non-tender without masses, or organomegaly  Extremities: no clubbing, cyanosis, edema, or deformity noted  Neurologic: normal CN II-XII and strength normal.  Skin: intact without lesions or rashes  Cervical Nodes: no significant adenopathy  CT CHEST WITH CONTRAST 07/10/11:  Technique: Multidetector CT imaging of the chest was performed  following the standard protocol during bolus administration of  intravenous contrast.  Contrast: 80 ml Omnipaque-300  Comparison: None.   Findings: Bilateral multilobar pulmonary air space disease has  resolved since previous exam. Mild scarring is seen in the  inferior aspect the right middle lobe. No neural persistent areas  of pulmonary air space disease are identified. No suspicious  pulmonary nodules or masses are identified. No endobronchial  lesion identified. No evidence of pleural effusion.  No mediastinal or hilar masses are identified. No adenopathy seen  elsewhere within the  thorax. Hepatic steatosis again noted. Both  adrenal glands are normal in appearance.   IMPRESSION:  1. Resolution of multilobar pulmonary air space disease since  previous exam.  2. No active disease or acute findings.  3. Stable hepatic steatosis.        Assessment & Plan:   EOSINOPHILIC PNEUMONIA She has done well since d/c of prednisone.  Will monitor clinically and repeat chest xray and labs at next visit.    ASTHMA She is to continue dulera and prn albuterol.    Updated Medication List Outpatient Encounter Prescriptions as of 07/30/2011  Medication Sig Dispense Refill  . albuterol (PROAIR HFA) 108 (90 BASE) MCG/ACT inhaler Inhale 2 puffs into the lungs every 6 (six) hours as needed.        Marland Kitchen allopurinol (ZYLOPRIM) 300 MG tablet Take 300 mg by mouth. 1/2 BY MOUTH DAILY       . amLODipine (NORVASC) 10 MG tablet TAKE ONE TABLET BY MOUTH DAILY  30 tablet  0  . calcium-vitamin D (OSCAL WITH D) 500-200 MG-UNIT per tablet Take 2 tablets by mouth daily.        . cetirizine (ZYRTEC) 10 MG tablet Take 10 mg by mouth daily.        . Cholecalciferol (VITAMIN D3) 1000 UNITS tablet Take 1,000 Units by mouth daily.        Marland Kitchen dextromethorphan-guaiFENesin (MUCINEX DM) 30-600 MG per 12 hr tablet Take 1 tablet by mouth every 12 (twelve) hours as needed.        Marland Kitchen DIOVAN 160 MG  tablet TAKE ONE TABLET ONCE A DAY AFTER BENICAR SAMPLES COMPLETED  30 tablet  4  . LORazepam (ATIVAN) 1 MG tablet Take 1 tablet (1 mg total) by mouth every 8 (eight) hours as needed for anxiety.  30 tablet  1  . mometasone (NASONEX) 50 MCG/ACT nasal spray 2 sprays by Nasal route daily.        . Mometasone Furo-Formoterol Fum (DULERA) 100-5 MCG/ACT AERO 2 puffs twice a day  1 Inhaler  11  . Multiple Vitamins-Minerals (CENTURY SENIOR FORMULA PO) Take 1 tablet by mouth daily.        Marland Kitchen omeprazole (PRILOSEC) 20 MG capsule Take 1 capsule (20 mg total) by mouth daily.  90 capsule  2  . sertraline (ZOLOFT) 100 MG tablet TAKE ONE  TABLET BY MOUTH DAILY  90 tablet  1  . Thiamine HCl (VITAMIN B-1) 100 MG tablet Take 100 mg by mouth daily. As needed      . DISCONTD: famotidine (PEPCID) 20 MG tablet Take 20 mg by mouth. 1 at bedtime as needed       . DISCONTD: predniSONE (DELTASONE) 5 MG tablet One pill daily for two weeks, then one pill every other day for two weeks  21 tablet  0  . DISCONTD: traMADol (ULTRAM) 50 MG tablet 1/2-1 every 6 hours prn  60 tablet  1

## 2011-07-30 NOTE — Assessment & Plan Note (Signed)
She has done well since d/c of prednisone.  Will monitor clinically and repeat chest xray and labs at next visit.

## 2011-07-30 NOTE — Patient Instructions (Signed)
Follow up in 6 weeks with Chest xray, CMET, and CBC with differential

## 2011-08-11 ENCOUNTER — Other Ambulatory Visit: Payer: Self-pay | Admitting: Internal Medicine

## 2011-08-15 ENCOUNTER — Other Ambulatory Visit: Payer: Self-pay | Admitting: Internal Medicine

## 2011-08-15 MED ORDER — LORAZEPAM 1 MG PO TABS: 1.0000 mg | ORAL_TABLET | Freq: Three times a day (TID) | ORAL | Status: DC | PRN

## 2011-08-15 NOTE — Telephone Encounter (Signed)
RX sent to pharmacy  

## 2011-09-11 ENCOUNTER — Other Ambulatory Visit: Payer: Self-pay | Admitting: Cardiovascular Disease

## 2011-09-11 ENCOUNTER — Other Ambulatory Visit: Payer: Self-pay | Admitting: Internal Medicine

## 2011-09-11 NOTE — Telephone Encounter (Signed)
274.9 Uric Acid Level

## 2011-09-18 ENCOUNTER — Ambulatory Visit (INDEPENDENT_AMBULATORY_CARE_PROVIDER_SITE_OTHER): Payer: Managed Care, Other (non HMO) | Admitting: Internal Medicine

## 2011-09-18 ENCOUNTER — Encounter: Payer: Self-pay | Admitting: Internal Medicine

## 2011-09-18 VITALS — BP 114/68 | HR 82 | Temp 98.0°F | Resp 14 | Ht 60.0 in | Wt 162.0 lb

## 2011-09-18 DIAGNOSIS — I10 Essential (primary) hypertension: Secondary | ICD-10-CM

## 2011-09-18 DIAGNOSIS — R7989 Other specified abnormal findings of blood chemistry: Secondary | ICD-10-CM

## 2011-09-18 DIAGNOSIS — E119 Type 2 diabetes mellitus without complications: Secondary | ICD-10-CM

## 2011-09-18 DIAGNOSIS — Z0000 Encounter for general adult medical examination without abnormal findings: Secondary | ICD-10-CM

## 2011-09-18 DIAGNOSIS — M899 Disorder of bone, unspecified: Secondary | ICD-10-CM

## 2011-09-18 DIAGNOSIS — E790 Hyperuricemia without signs of inflammatory arthritis and tophaceous disease: Secondary | ICD-10-CM

## 2011-09-18 DIAGNOSIS — M949 Disorder of cartilage, unspecified: Secondary | ICD-10-CM

## 2011-09-18 DIAGNOSIS — E785 Hyperlipidemia, unspecified: Secondary | ICD-10-CM

## 2011-09-18 LAB — CBC WITH DIFFERENTIAL/PLATELET
Basophils Absolute: 0 10*3/uL (ref 0.0–0.1)
Basophils Relative: 0.4 % (ref 0.0–3.0)
Eosinophils Absolute: 0.6 10*3/uL (ref 0.0–0.7)
Eosinophils Relative: 9.7 % — ABNORMAL HIGH (ref 0.0–5.0)
HCT: 44.2 % (ref 36.0–46.0)
Hemoglobin: 14.8 g/dL (ref 12.0–15.0)
Lymphocytes Relative: 16.8 % (ref 12.0–46.0)
Lymphs Abs: 1 10*3/uL (ref 0.7–4.0)
MCHC: 33.4 g/dL (ref 30.0–36.0)
MCV: 94.9 fl (ref 78.0–100.0)
Monocytes Absolute: 0.5 10*3/uL (ref 0.1–1.0)
Monocytes Relative: 8.7 % (ref 3.0–12.0)
Neutro Abs: 3.9 10*3/uL (ref 1.4–7.7)
Neutrophils Relative %: 64.4 % (ref 43.0–77.0)
Platelets: 174 10*3/uL (ref 150.0–400.0)
RBC: 4.66 Mil/uL (ref 3.87–5.11)
RDW: 14.5 % (ref 11.5–14.6)
WBC: 6.1 10*3/uL (ref 4.5–10.5)

## 2011-09-18 LAB — HEPATIC FUNCTION PANEL
ALT: 31 U/L (ref 0–35)
AST: 47 U/L — ABNORMAL HIGH (ref 0–37)
Albumin: 4.6 g/dL (ref 3.5–5.2)
Alkaline Phosphatase: 68 U/L (ref 39–117)
Bilirubin, Direct: 0 mg/dL (ref 0.0–0.3)
Total Bilirubin: 0.7 mg/dL (ref 0.3–1.2)
Total Protein: 7.7 g/dL (ref 6.0–8.3)

## 2011-09-18 LAB — LIPID PANEL
Cholesterol: 203 mg/dL — ABNORMAL HIGH (ref 0–200)
HDL: 67 mg/dL (ref >39.00)
Total CHOL/HDL Ratio: 3
Triglycerides: 114 mg/dL (ref 0.0–149.0)
VLDL: 22.8 mg/dL (ref 0.0–40.0)

## 2011-09-18 LAB — BASIC METABOLIC PANEL
BUN: 12 mg/dL (ref 6–23)
CO2: 28 mEq/L (ref 19–32)
Calcium: 9.7 mg/dL (ref 8.4–10.5)
Chloride: 102 mEq/L (ref 96–112)
Creatinine, Ser: 0.9 mg/dL (ref 0.4–1.2)
GFR: 70.61 mL/min (ref >60.00)
Glucose, Bld: 107 mg/dL — ABNORMAL HIGH (ref 70–99)
Potassium: 3.8 mEq/L (ref 3.5–5.1)
Sodium: 140 mEq/L (ref 135–145)

## 2011-09-18 LAB — LDL CHOLESTEROL, DIRECT: Direct LDL: 120.5 mg/dL

## 2011-09-18 LAB — TSH: TSH: 1.1 u[IU]/mL (ref 0.35–5.50)

## 2011-09-18 LAB — URIC ACID: Uric Acid, Serum: 4.2 mg/dL (ref 2.4–7.0)

## 2011-09-18 LAB — HEMOGLOBIN A1C: Hgb A1c MFr Bld: 5.2 % (ref 4.6–6.5)

## 2011-09-18 MED ORDER — SERTRALINE HCL 100 MG PO TABS: 100.0000 mg | ORAL_TABLET | Freq: Every day | ORAL | Status: DC

## 2011-09-18 MED ORDER — ALLOPURINOL 300 MG PO TABS: ORAL_TABLET | ORAL | Status: DC

## 2011-09-18 MED ORDER — TRAMADOL HCL 50 MG PO TABS: ORAL_TABLET | ORAL | Status: DC

## 2011-09-18 MED ORDER — AMLODIPINE BESYLATE 10 MG PO TABS: 10.0000 mg | ORAL_TABLET | Freq: Every day | ORAL | Status: DC

## 2011-09-18 MED ORDER — LOSARTAN POTASSIUM 100 MG PO TABS: 100.0000 mg | ORAL_TABLET | Freq: Every day | ORAL | Status: DC

## 2011-09-18 MED ORDER — LORAZEPAM 1 MG PO TABS: 1.0000 mg | ORAL_TABLET | Freq: Three times a day (TID) | ORAL | Status: DC | PRN

## 2011-09-18 MED ORDER — OMEPRAZOLE 20 MG PO CPDR: 20.0000 mg | DELAYED_RELEASE_CAPSULE | Freq: Every day | ORAL | Status: DC

## 2011-09-18 MED ORDER — ALBUTEROL SULFATE HFA 108 (90 BASE) MCG/ACT IN AERS: 2.0000 | INHALATION_SPRAY | Freq: Four times a day (QID) | RESPIRATORY_TRACT | Status: DC | PRN

## 2011-09-18 MED ORDER — MOMETASONE FUROATE 50 MCG/ACT NA SUSP: 2.0000 | Freq: Every day | NASAL | Status: DC

## 2011-09-18 NOTE — Progress Notes (Signed)
Addended by: Edgardo Roys on: 09/18/2011 12:11 PM   Modules accepted: Orders

## 2011-09-18 NOTE — Progress Notes (Signed)
Addended byMarga Melnick F on: 09/18/2011 12:13 PM   Modules accepted: Orders

## 2011-09-18 NOTE — Patient Instructions (Signed)
Verify when her mammogram is due. As you prescriptions run out these will be replaced with generics if at all possible.

## 2011-09-18 NOTE — Progress Notes (Signed)
Subjective:    Patient ID: Lisa Larson, female    DOB: 10/09/1941, 70 y.o.   MRN: [id number]  HPI  She is not on Medicare at this time but remains on [employer]. 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 Medicare will start December 1 of this year.  She is essentially asymptomatic at this time except for some postnasal drainage which started this morning. Because of her pending medication coverage she is trying to change her medicines to generics.    Review of Systems CHRONIC HYPERTENSION: Disease Monitoring  Blood pressure range: averages < 140/90  Chest pain: no   Dyspnea: yes, stable (see Pulmonary records, Dr Craige Cotta)   Claudication: no   Medication compliance: yes,   Medication Side Effects  Lightheadedness: no   Urinary frequency: yes , nocturia 2-3 X/ night  Edema: no   Preventitive Healthcare:  Exercise: no, due to pulmonary disease   Diet Pattern: no plan  Salt Restriction: yes       Objective:   Physical Exam Gen.: well-nourished in appearance. Alert, appropriate and cooperative throughout exam. Head: Normocephalic without obvious abnormalities Eyes: No corneal or conjunctival inflammation noted. Pupils equal round reactive to light and accommodation.  Extraocular motion intact. Ptosis OS> OD. Ears: External  ear exam reveals no significant lesions or deformities. Canals: wax on R ; L normal.  Nose: External nasal exam reveals no deformity or inflammation. Nasal mucosa are pink and moist. No lesions or exudates noted.  Mouth: Oral mucosa and oropharynx reveal no lesions or exudates. Teeth in good repair. Neck: No deformities, masses, or tenderness noted. Range of motion  Slightly decreased. Thyroid normal. Lungs: Normal respiratory effort; chest expands symmetrically. Lungs are clear to auscultation without rales, wheezes, or increased work of breathing. SURPRIZINGLY clear Heart: Normal rate and rhythm. Normal  S1 and S2. No gallop, click, or rub. S4 w/o murmur. Abdomen: Bowel sounds normal; abdomen soft and nontender. No masses, organomegaly or hernias noted. Genitalia: S/P TAH & ? USO  .                                                                                   Musculoskeletal/extremities: Lordosis noted of  the thoracic  spine. No clubbing, cyanosis, edema noted. Range of motion  Dramatically decreased @ knees.Joints :marked DJD of hands. Fusiform changes of knees. Nail health  good. Vascular: Carotid, radial artery, dorsalis pedis and  posterior tibial pulses are full and equal. No bruits present. Neurologic: Alert and oriented x3. Deep tendon reflexes asymmetrical ; decreased @ L knee       Skin: Intact without suspicious lesions or rashes. Lymph: No cervical, axillary  lymphadenopathy present. Psych: Mood and affect are essentially  normal. Normally interactive  Assessment & Plan:  #1 comprehensive physical exam; no acute findings #2 see Problem List with Assessments & Recommendations Plan: see Orders

## 2011-09-19 LAB — VITAMIN D 25 HYDROXY (VIT D DEFICIENCY, FRACTURES): Vit D, 25-Hydroxy: 51 ng/mL (ref 30–89)

## 2011-09-25 ENCOUNTER — Telehealth: Payer: Self-pay | Admitting: *Deleted

## 2011-09-25 DIAGNOSIS — I10 Essential (primary) hypertension: Secondary | ICD-10-CM

## 2011-09-25 DIAGNOSIS — Z1231 Encounter for screening mammogram for malignant neoplasm of breast: Secondary | ICD-10-CM

## 2011-09-25 NOTE — Telephone Encounter (Signed)
Pt called to inform Dr Alwyn Ren that her last mammogram was done on 09-27-09 with indication to recheck in 1 year. Pt states that Dr hopper was unclear if she needed to have this done now. Please advise

## 2011-09-25 NOTE — Telephone Encounter (Signed)
I do recommend that the mammogram be scheduled as was recommended by the radiology facility. Please call them    and schedule this.

## 2011-09-26 MED ORDER — LOSARTAN POTASSIUM 100 MG PO TABS: 100.0000 mg | ORAL_TABLET | Freq: Every day | ORAL | Status: DC

## 2011-09-26 NOTE — Telephone Encounter (Signed)
Spoke with patient, patient ok with referral for mammogram, patient states she and day except 09/30/11.

## 2011-09-29 ENCOUNTER — Other Ambulatory Visit: Payer: Self-pay | Admitting: Pulmonary Disease

## 2011-09-29 DIAGNOSIS — J8289 Other pulmonary eosinophilia, not elsewhere classified: Secondary | ICD-10-CM

## 2011-09-30 ENCOUNTER — Encounter: Payer: Self-pay | Admitting: Pulmonary Disease

## 2011-09-30 ENCOUNTER — Ambulatory Visit (INDEPENDENT_AMBULATORY_CARE_PROVIDER_SITE_OTHER)
Admission: RE | Admit: 2011-09-30 | Discharge: 2011-09-30 | Disposition: A | Discharge status: Home / Self Care | Payer: Managed Care, Other (non HMO) | Source: Ambulatory Visit | Attending: Pulmonary Disease | Admitting: Pulmonary Disease

## 2011-09-30 ENCOUNTER — Ambulatory Visit (INDEPENDENT_AMBULATORY_CARE_PROVIDER_SITE_OTHER): Payer: Managed Care, Other (non HMO) | Admitting: Pulmonary Disease

## 2011-09-30 DIAGNOSIS — Z23 Encounter for immunization: Secondary | ICD-10-CM

## 2011-09-30 DIAGNOSIS — J45909 Unspecified asthma, uncomplicated: Secondary | ICD-10-CM

## 2011-09-30 DIAGNOSIS — J8289 Other pulmonary eosinophilia, not elsewhere classified: Secondary | ICD-10-CM

## 2011-09-30 NOTE — Patient Instructions (Signed)
Flu shot today Follow up in 3 months with chest xray and CBC with differential

## 2011-09-30 NOTE — Progress Notes (Signed)
Subjective:    Patient ID: Lisa Larson, female    DOB: [birth year], 70 y.o.   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  She has been doing well.  She denies cough, wheeze, sputum, fever, hemoptysis, skin rash, or chest pain.  She is not using albuterol much.  Her main problem is related to back pain and knee pain.  Lab work from 09/18/11 was reviewed.  Past Medical History  Diagnosis Date  . 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 IGE 282 from 02-05-11  . Asthma   . Hypertension   . Osteopenia     BMD t score-1.6 at L femoral neck 11-27-2009, s/p fosamax x 5 years  . GERD (gastroesophageal reflux disease)   . Depression   . Hyperlipidemia   . Anxiety   . Baker's cyst, ruptured 2012    right    Family History  Problem Relation Age of Onset  . Arthritis Father   . Rheum arthritis Father   . Hypertension Father   . Hypertension Mother   . Alzheimer's disease Mother   . Hypertension Brother   . Diabetes Brother   . Cancer Son     laryngeal    Social History  . Marital Status: Married   Occupational History  . grocery store clerk    Social History Main Topics  . Smoking status: Former Smoker -- 1.0 packs/day for 15 years    Types: Cigarettes    Quit date: 12/29/1968  . Smokeless tobacco: Never Used  . Alcohol Use: 7.0 oz/week    14 drink(s) per week  . Drug Use: No   Allergies  Allergen Reactions  . Benazepril Hcl     No PMH of angioedema; ACE-I caused cough  . Oxycodone-Aspirin   . Rofecoxib   . Sulfonamide Derivatives     Outpatient Prescriptions Prior to Visit  Medication Sig Dispense Refill  . albuterol (PROAIR HFA) 108 (90 BASE) MCG/ACT inhaler Inhale 2 puffs into the lungs every 6 (six) hours as needed.  18 g  11  . allopurinol (ZYLOPRIM) 300 MG tablet 1/2 BY MOUTH DAILY  45 tablet  2  .  amLODipine (NORVASC) 10 MG tablet Take 1 tablet (10 mg total) by mouth daily.  90 tablet  2  . calcium-vitamin D (OSCAL WITH D) 500-200 MG-UNIT per tablet Take 2 tablets by mouth daily.        . cetirizine (ZYRTEC) 10 MG tablet Take 10 mg by mouth daily.        . Cholecalciferol (VITAMIN D3) 1000 UNITS tablet Take 1,000 Units by mouth daily.        Marland Kitchen dextromethorphan-guaiFENesin (MUCINEX DM) 30-600 MG per 12 hr tablet Take 1 tablet by mouth every 12 (twelve) hours as needed.        . famotidine (PEPCID) 20 MG tablet Take 20 mg by mouth daily.        Marland Kitchen LORazepam (ATIVAN) 1 MG tablet Take 1 tablet (1 mg total) by mouth every 8 (eight) hours as needed for anxiety.  30 tablet  1  . losartan (COZAAR) 100 MG tablet Take 1 tablet (100 mg total) by mouth daily.  90 tablet  3  . mometasone (NASONEX) 50 MCG/ACT nasal spray Place 2 sprays into the nose daily.  17 g  11  . Mometasone Furo-Formoterol Fum (DULERA) 100-5  MCG/ACT AERO 2 puffs twice a day  1 Inhaler  11  . Multiple Vitamins-Minerals (CENTURY SENIOR FORMULA PO) Take 1 tablet by mouth daily.        Marland Kitchen omeprazole (PRILOSEC) 20 MG capsule Take 1 capsule (20 mg total) by mouth daily.  90 capsule  3  . sertraline (ZOLOFT) 100 MG tablet Take 1 tablet (100 mg total) by mouth daily.  90 tablet  2  . Thiamine HCl (VITAMIN B-1) 100 MG tablet Take 100 mg by mouth daily. As needed      . traMADol (ULTRAM) 50 MG tablet 1/2-1 by mouth every 6 hours as needed  60 tablet  1   Review of Systems     Objective:   Physical Exam  BP 138/78  Pulse 86  Temp(Src) 98.1 F (36.7 C) (Oral)  Wt 164 lb 9.6 oz (74.662 kg)  General: normal appearance and healthy appearing.  Nose: no deformity, discharge, inflammation, or lesions  Mouth: no exudate  Neck: no masses, thyromegaly, or abnormal cervical nodes  Lungs: no wheeze or rales  Heart: regular rate and rhythm, S1, S2 without murmurs, rubs, gallops, or clicks  Abdomen: bowel sounds positive; abdomen soft and  non-tender without masses, or organomegaly  Extremities: no clubbing, cyanosis, edema, or deformity noted  Neurologic: normal CN II-XII and strength normal.  Skin: intact without lesions or rashes  Cervical Nodes: no significant adenopathy      Assessment & Plan:   EOSINOPHILIC PNEUMONIA Clinically stable off prednisone.  She had increased percentage of eosinophils on recent blood test, but absolute count was in normal range.  I don't think she needs additional prednisone at this time.  Will continue clinical monitoring and repeat xray and lab work at next visit.  ASTHMA Stable on current inhaler regimen.  She got her influenza vaccine today.    Updated Medication List Outpatient Encounter Prescriptions as of 09/30/2011  Medication Sig Dispense Refill  . albuterol (PROAIR HFA) 108 (90 BASE) MCG/ACT inhaler Inhale 2 puffs into the lungs every 6 (six) hours as needed.  18 g  11  . allopurinol (ZYLOPRIM) 300 MG tablet 1/2 BY MOUTH DAILY  45 tablet  2  . amLODipine (NORVASC) 10 MG tablet Take 1 tablet (10 mg total) by mouth daily.  90 tablet  2  . calcium-vitamin D (OSCAL WITH D) 500-200 MG-UNIT per tablet Take 2 tablets by mouth daily.        . cetirizine (ZYRTEC) 10 MG tablet Take 10 mg by mouth daily.        . Cholecalciferol (VITAMIN D3) 1000 UNITS tablet Take 1,000 Units by mouth daily.        Marland Kitchen dextromethorphan-guaiFENesin (MUCINEX DM) 30-600 MG per 12 hr tablet Take 1 tablet by mouth every 12 (twelve) hours as needed.        . famotidine (PEPCID) 20 MG tablet Take 20 mg by mouth daily.        Marland Kitchen LORazepam (ATIVAN) 1 MG tablet Take 1 tablet (1 mg total) by mouth every 8 (eight) hours as needed for anxiety.  30 tablet  1  . losartan (COZAAR) 100 MG tablet Take 1 tablet (100 mg total) by mouth daily.  90 tablet  3  . mometasone (NASONEX) 50 MCG/ACT nasal spray Place 2 sprays into the nose daily.  17 g  11  . Mometasone Furo-Formoterol Fum (DULERA) 100-5 MCG/ACT AERO 2 puffs twice a day   1 Inhaler  11  . Multiple Vitamins-Minerals (CENTURY [place]  FORMULA PO) Take 1 tablet by mouth daily.        Marland Kitchen omeprazole (PRILOSEC) 20 MG capsule Take 1 capsule (20 mg total) by mouth daily.  90 capsule  3  . sertraline (ZOLOFT) 100 MG tablet Take 1 tablet (100 mg total) by mouth daily.  90 tablet  2  . Thiamine HCl (VITAMIN B-1) 100 MG tablet Take 100 mg by mouth daily. As needed      . traMADol (ULTRAM) 50 MG tablet 1/2-1 by mouth every 6 hours as needed  60 tablet  1

## 2011-09-30 NOTE — Assessment & Plan Note (Signed)
Stable on current inhaler regimen.  She got her influenza vaccine today.

## 2011-09-30 NOTE — Assessment & Plan Note (Signed)
Clinically stable off prednisone.  She had increased percentage of eosinophils on recent blood test, but absolute count was in normal range.  I don't think she needs additional prednisone at this time.  Will continue clinical monitoring and repeat xray and lab work at next visit.

## 2011-10-01 ENCOUNTER — Telehealth: Payer: Self-pay | Admitting: Pulmonary Disease

## 2011-10-01 DIAGNOSIS — J8281 Chronic eosinophilic pneumonia: Secondary | ICD-10-CM

## 2011-10-01 NOTE — Telephone Encounter (Signed)
CXR 09/30/11 shows vague nodular area in Rt upper lung field.    Results d/w pt over the phone.  Will arrange for CT chest with IV contrast to further assess.

## 2011-10-02 ENCOUNTER — Other Ambulatory Visit: Payer: Managed Care, Other (non HMO)

## 2011-10-07 ENCOUNTER — Telehealth: Payer: Self-pay | Admitting: Pulmonary Disease

## 2011-10-07 DIAGNOSIS — J8281 Chronic eosinophilic pneumonia: Secondary | ICD-10-CM

## 2011-10-07 NOTE — Telephone Encounter (Signed)
I spoke with pt and pt and she is requesting her CT results results from Friday. Pt states she had this done at [facility]. Pt also wanted [place] to add diclofenac to her allergy list but does not remember what kind of reaction she had to it. Please advise Dr. Craige Cotta, thanks  Carver Fila, CMA

## 2011-10-08 ENCOUNTER — Telehealth: Payer: Self-pay | Admitting: Pulmonary Disease

## 2011-10-08 MED ORDER — PREDNISONE 20 MG PO TABS: 20.0000 mg | ORAL_TABLET | Freq: Every day | ORAL | Status: AC

## 2011-10-08 NOTE — Telephone Encounter (Signed)
I have received disc. Pt states she wants a call back tomorrow, she can't wait any longer. Please advise Dr. Craige Cotta it is in your look at, thanks  Carver Fila, CMA

## 2011-10-08 NOTE — Telephone Encounter (Signed)
Reviewed CT chest results and d/w pt over phone.  Will start prednisone 20 mg daily.  Will have my nurse schedule ROV in 2 weeks with chest xray and CBC with differential.

## 2011-10-08 NOTE — Telephone Encounter (Signed)
Pt's daughter tiffany says mom is anxious and worried wants to know if any other doctor can read ct and give her results can be reached at [phone].Lisa Larson

## 2011-10-08 NOTE — Telephone Encounter (Signed)
I spoke with Dr. Craige Cotta and he does not have the results. I spoke with Lisa Larson and she is aware she needs to bring disc by so VS can review this tomorrow. Lisa Larson states she will get her daughter to drop the disc off this afternoon. Will hold in my box until receive disc.

## 2011-10-08 NOTE — Telephone Encounter (Signed)
Pt is requesting her CT results today that was done on Friday. Pt states she does not want to wait until Dr. Craige Cotta replies and wants these results ASAP. Will forward to the doc of the day. Please advise Dr. Shelle Iron, thanks  Carver Fila, CMA

## 2011-10-08 NOTE — Telephone Encounter (Signed)
Error.Lisa Larson ° °

## 2011-10-08 NOTE — Telephone Encounter (Signed)
I have paged Dr. Craige Cotta to see if he can review pt's ct results

## 2011-10-08 NOTE — Telephone Encounter (Signed)
PT NEEDS TO KNOW THE RESULTS OF HER CT. "CANNOT WAIT UNTIL VS RETURNS TO OFFICE". SHE ASKS THAT SOMEONE GIVE HER RESULTS TODAY. SHE IS VERY CONCERNED. CALL PT (AT # GIVEN PREVIOUSLY) OR HER DAUGHTER [name] Wittwer @ [phone]. AGAIN, SHE ASK THAT ANOTHER DR REVIEW THIS IS VS IS NOT AVAIL. Hazel Sams

## 2011-10-09 NOTE — Telephone Encounter (Signed)
Returning call.

## 2011-10-09 NOTE — Telephone Encounter (Signed)
Pt is coming in 10/30/11 at 9 am. Pt aware to have labs and cxr prior to apt. Nothing further was needed

## 2011-10-09 NOTE — Telephone Encounter (Signed)
[email or web address]  

## 2011-10-15 ENCOUNTER — Encounter: Payer: Self-pay | Admitting: Pulmonary Disease

## 2011-10-17 ENCOUNTER — Encounter: Payer: Self-pay | Admitting: Internal Medicine

## 2011-10-30 ENCOUNTER — Ambulatory Visit (INDEPENDENT_AMBULATORY_CARE_PROVIDER_SITE_OTHER): Payer: Managed Care, Other (non HMO) | Admitting: Pulmonary Disease

## 2011-10-30 ENCOUNTER — Encounter: Payer: Self-pay | Admitting: Pulmonary Disease

## 2011-10-30 ENCOUNTER — Ambulatory Visit (INDEPENDENT_AMBULATORY_CARE_PROVIDER_SITE_OTHER)
Admission: RE | Admit: 2011-10-30 | Discharge: 2011-10-30 | Disposition: A | Discharge status: Home / Self Care | Payer: Managed Care, Other (non HMO) | Source: Ambulatory Visit | Attending: Pulmonary Disease | Admitting: Pulmonary Disease

## 2011-10-30 ENCOUNTER — Other Ambulatory Visit (INDEPENDENT_AMBULATORY_CARE_PROVIDER_SITE_OTHER): Payer: Managed Care, Other (non HMO)

## 2011-10-30 VITALS — BP 140/72 | HR 79 | Temp 97.9°F | Ht 61.0 in | Wt 169.4 lb

## 2011-10-30 DIAGNOSIS — J8281 Chronic eosinophilic pneumonia: Secondary | ICD-10-CM

## 2011-10-30 DIAGNOSIS — J8289 Other pulmonary eosinophilia, not elsewhere classified: Secondary | ICD-10-CM

## 2011-10-30 DIAGNOSIS — J45909 Unspecified asthma, uncomplicated: Secondary | ICD-10-CM

## 2011-10-30 DIAGNOSIS — J309 Allergic rhinitis, unspecified: Secondary | ICD-10-CM

## 2011-10-30 LAB — CBC WITH DIFFERENTIAL/PLATELET
Basophils Absolute: 0 10*3/uL (ref 0.0–0.1)
Basophils Relative: 0.4 % (ref 0.0–3.0)
Eosinophils Absolute: 0.1 10*3/uL (ref 0.0–0.7)
Eosinophils Relative: 1.7 % (ref 0.0–5.0)
HCT: 38.7 % (ref 36.0–46.0)
Hemoglobin: 13.3 g/dL (ref 12.0–15.0)
Lymphocytes Relative: 27.8 % (ref 12.0–46.0)
Lymphs Abs: 2.2 10*3/uL (ref 0.7–4.0)
MCHC: 34.3 g/dL (ref 30.0–36.0)
MCV: 94.2 fl (ref 78.0–100.0)
Monocytes Absolute: 0.7 10*3/uL (ref 0.1–1.0)
Monocytes Relative: 8.9 % (ref 3.0–12.0)
Neutro Abs: 4.7 10*3/uL (ref 1.4–7.7)
Neutrophils Relative %: 61.2 % (ref 43.0–77.0)
Platelets: 176 10*3/uL (ref 150.0–400.0)
RBC: 4.11 Mil/uL (ref 3.87–5.11)
RDW: 14.4 % (ref 11.5–14.6)
WBC: 7.7 10*3/uL (ref 4.5–10.5)

## 2011-10-30 MED ORDER — PREDNISONE 5 MG PO TABS: ORAL_TABLET | ORAL | Status: DC

## 2011-10-30 MED ORDER — MOMETASONE FURO-FORMOTEROL FUM 100-5 MCG/ACT IN AERO: 2.0000 | INHALATION_SPRAY | Freq: Every day | RESPIRATORY_TRACT | Status: DC

## 2011-10-30 MED ORDER — MOMETASONE FUROATE 50 MCG/ACT NA SUSP: 2.0000 | Freq: Every day | NASAL | Status: DC | PRN

## 2011-10-30 NOTE — Assessment & Plan Note (Signed)
She can use nasonex as needed.

## 2011-10-30 NOTE — Assessment & Plan Note (Signed)
She has radiographic improvement and decrease in eosinophil count with prednisone.  Will gradually decrease prednisone as tolerated and f/u CXR and CBC with differential.

## 2011-10-30 NOTE — Progress Notes (Signed)
Chief Complaint  Patient presents with  . 2 week follow    w/ cxr and lab. Pt states her breathing has been alright. Pt c/o dry cough for a while.    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  She has felt more energetic since starting prednisone again.  She does not have cough, wheeze, sputum, fever, skin rash, or leg swelling.  Her daughter wants her to get a second opinion from an allergist.  She is scheduled to Dr. Lucie Leather.    Past Medical History  Diagnosis Date  . 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 IGE 282 from 02-05-11  . Asthma   . Hypertension   . Osteopenia     BMD t score-1.6 at L femoral neck 11-27-2009, s/p fosamax x 5 years  . GERD (gastroesophageal reflux disease)   . Depression   . Hyperlipidemia   . Anxiety   . Baker's cyst, ruptured 2012    right    Past Surgical History  Procedure Date  . Shoulder surgery 08-2008    fracture repair, Dr. Gean Birchwood  . Total abdominal hysterectomy   . Tonsillectomy and adenoidectomy   . Colonoscopy   . Nasal sinus surgery   . Appendectomy   . Esophageal dilation     dr. Juanda Chance  . Bronchoscopy 12-2010    Dr. Craige Cotta    Current Outpatient Prescriptions on File Prior to Visit  Medication Sig Dispense Refill  . albuterol (PROAIR HFA) 108 (90 BASE) MCG/ACT inhaler Inhale 2 puffs into the lungs every 6 (six) hours as needed.  18 g  11  . allopurinol (ZYLOPRIM) 300 MG tablet 1/2 BY MOUTH DAILY  45 tablet  2  . amLODipine (NORVASC) 10 MG tablet Take 1 tablet (10 mg total) by mouth daily.  90 tablet  2  . calcium-vitamin D (OSCAL WITH D) 500-200 MG-UNIT per tablet Take 2 tablets by mouth daily.        . cetirizine (ZYRTEC) 10 MG tablet Take 10 mg by mouth daily.        . Cholecalciferol (VITAMIN D3) 1000 UNITS tablet Take 1,000 Units by mouth daily.        Marland Kitchen  dextromethorphan-guaiFENesin (MUCINEX DM) 30-600 MG per 12 hr tablet Take 1 tablet by mouth every 12 (twelve) hours as needed.        . famotidine (PEPCID) 20 MG tablet Take 20 mg by mouth daily.        Marland Kitchen LORazepam (ATIVAN) 1 MG tablet Take 1 tablet (1 mg total) by mouth every 8 (eight) hours as needed for anxiety.  30 tablet  1  . losartan (COZAAR) 100 MG tablet Take 1 tablet (100 mg total) by mouth daily.  90 tablet  3  . omeprazole (PRILOSEC) 20 MG capsule Take 1 capsule (20 mg total) by mouth daily.  90 capsule  3  . sertraline (ZOLOFT) 100 MG tablet Take 1 tablet (100 mg total) by mouth daily.  90 tablet  2  . Thiamine HCl (VITAMIN B-1) 100 MG tablet Take 100 mg by mouth daily. As needed      . traMADol (ULTRAM) 50 MG tablet 1/2-1 by mouth every 6 hours as needed  60 tablet  1    Allergies  Allergen Reactions  . Benazepril Hcl     No PMH of angioedema; ACE-I caused  cough  . Diclofenac   . Oxycodone-Aspirin   . Rofecoxib   . Sulfonamide Derivatives     Physical Exam:  Blood pressure 140/72, pulse 79, temperature 97.9 F (36.6 C), temperature source Oral, height 5\' 1"  (1.549 m), weight 169 lb 6.4 oz (76.839 kg), SpO2 97.00%.  General: normal appearance and healthy appearing.  Nose: no deformity, discharge, inflammation, or lesions  Mouth: no exudate  Neck: no masses, thyromegaly, or abnormal cervical nodes  Lungs: no wheeze or rales  Heart: regular rate and rhythm, S1, S2 without murmurs, rubs, gallops, or clicks  Abdomen: bowel sounds positive; abdomen soft and non-tender without masses, or organomegaly  Extremities: no clubbing, cyanosis, edema, or deformity noted  Neurologic: normal CN II-XII and strength normal.  Skin: intact without lesions or rashes  Cervical Nodes: no significant adenopathy  CHEST - 2 VIEW 10/30/11:  Comparison: Chest x-ray of 09/30/2011  Findings: Linear atelectasis and/or scarring remains at the lung bases primarily in the right middle lobe and or  lingula. No active infiltrate or effusion is seen. The heart is mildly enlarged and stable. There are degenerative changes throughout the thoracic spine.  IMPRESSION:  Stable basilar linear atelectasis or scarring. No active lung disease.  Lab Results  Component Value Date   WBC 7.7 10/30/2011   HGB 13.3 10/30/2011   HCT 38.7 10/30/2011   MCV 94.2 10/30/2011   PLT 176.0 10/30/2011  Eosinophil 0.1  Assessment/Plan:  EOSINOPHILIC PNEUMONIA She has radiographic improvement and decrease in eosinophil count with prednisone.  Will gradually decrease prednisone as tolerated and f/u CXR and CBC with differential.  ASTHMA Will decrease dulera to two puffs qhs.  Allergic rhinitis She can use nasonex as needed.     Outpatient Encounter Prescriptions as of 10/30/2011  Medication Sig Dispense Refill  . albuterol (PROAIR HFA) 108 (90 BASE) MCG/ACT inhaler Inhale 2 puffs into the lungs every 6 (six) hours as needed.  18 g  11  . allopurinol (ZYLOPRIM) 300 MG tablet 1/2 BY MOUTH DAILY  45 tablet  2  . amLODipine (NORVASC) 10 MG tablet Take 1 tablet (10 mg total) by mouth daily.  90 tablet  2  . calcium-vitamin D (OSCAL WITH D) 500-200 MG-UNIT per tablet Take 2 tablets by mouth daily.        . cetirizine (ZYRTEC) 10 MG tablet Take 10 mg by mouth daily.        . Cholecalciferol (VITAMIN D3) 1000 UNITS tablet Take 1,000 Units by mouth daily.        Marland Kitchen dextromethorphan-guaiFENesin (MUCINEX DM) 30-600 MG per 12 hr tablet Take 1 tablet by mouth every 12 (twelve) hours as needed.        . famotidine (PEPCID) 20 MG tablet Take 20 mg by mouth daily.        Marland Kitchen LORazepam (ATIVAN) 1 MG tablet Take 1 tablet (1 mg total) by mouth every 8 (eight) hours as needed for anxiety.  30 tablet  1  . losartan (COZAAR) 100 MG tablet Take 1 tablet (100 mg total) by mouth daily.  90 tablet  3  . mometasone (NASONEX) 50 MCG/ACT nasal spray Place 2 sprays into the nose daily as needed.  17 g  11  . mometasone-formoterol  (DULERA) 100-5 MCG/ACT AERO Inhale 2 puffs into the lungs at bedtime. 2 puffs twice a day  1 Inhaler  11  . omeprazole (PRILOSEC) 20 MG capsule Take 1 capsule (20 mg total) by mouth daily.  90 capsule  3  .  predniSONE (DELTASONE) 20 MG tablet Once a day      . sertraline (ZOLOFT) 100 MG tablet Take 1 tablet (100 mg total) by mouth daily.  90 tablet  2  . Thiamine HCl (VITAMIN B-1) 100 MG tablet Take 100 mg by mouth daily. As needed      . traMADol (ULTRAM) 50 MG tablet 1/2-1 by mouth every 6 hours as needed  60 tablet  1  . DISCONTD: mometasone (NASONEX) 50 MCG/ACT nasal spray Place 2 sprays into the nose daily.  17 g  11  . DISCONTD: Mometasone Furo-Formoterol Fum (DULERA) 100-5 MCG/ACT AERO 2 puffs twice a day  1 Inhaler  11  . predniSONE (DELTASONE) 5 MG tablet Use as directed  30 tablet  3  . DISCONTD: Multiple Vitamins-Minerals (CENTURY SENIOR FORMULA PO) Take 1 tablet by mouth daily.          Abdalla Naramore 10/30/2011, 11:55 AM

## 2011-10-30 NOTE — Assessment & Plan Note (Signed)
Will decrease dulera to two puffs qhs.

## 2011-10-30 NOTE — Patient Instructions (Signed)
Prednisone 20 mg daily for 2 weeks, then 15 mg daily until next visit Can use nasonex as needed Change dulera to two puffs at night Follow up in 6 weeks with chest xray and CBC with differential

## 2011-11-04 ENCOUNTER — Encounter: Payer: Self-pay | Admitting: Pulmonary Disease

## 2011-11-26 ENCOUNTER — Other Ambulatory Visit: Payer: Self-pay | Admitting: Internal Medicine

## 2011-11-28 MED ORDER — LORAZEPAM 1 MG PO TABS: 1.0000 mg | ORAL_TABLET | Freq: Three times a day (TID) | ORAL | Status: DC | PRN

## 2011-11-28 NOTE — Telephone Encounter (Signed)
Nasonex was filled for a year by Dr.Sood, left message on voicemail for pharmacy

## 2011-12-10 ENCOUNTER — Other Ambulatory Visit: Payer: Self-pay | Admitting: Pulmonary Disease

## 2011-12-10 DIAGNOSIS — J8289 Other pulmonary eosinophilia, not elsewhere classified: Secondary | ICD-10-CM

## 2011-12-11 ENCOUNTER — Ambulatory Visit (INDEPENDENT_AMBULATORY_CARE_PROVIDER_SITE_OTHER): Payer: Managed Care, Other (non HMO) | Admitting: Pulmonary Disease

## 2011-12-11 ENCOUNTER — Telehealth: Payer: Self-pay | Admitting: Pulmonary Disease

## 2011-12-11 ENCOUNTER — Encounter: Payer: Self-pay | Admitting: Pulmonary Disease

## 2011-12-11 ENCOUNTER — Other Ambulatory Visit (INDEPENDENT_AMBULATORY_CARE_PROVIDER_SITE_OTHER): Payer: Managed Care, Other (non HMO)

## 2011-12-11 ENCOUNTER — Ambulatory Visit (INDEPENDENT_AMBULATORY_CARE_PROVIDER_SITE_OTHER)
Admission: RE | Admit: 2011-12-11 | Discharge: 2011-12-11 | Disposition: A | Discharge status: Home / Self Care | Payer: Managed Care, Other (non HMO) | Source: Ambulatory Visit | Attending: Pulmonary Disease | Admitting: Pulmonary Disease

## 2011-12-11 DIAGNOSIS — J8289 Other pulmonary eosinophilia, not elsewhere classified: Secondary | ICD-10-CM

## 2011-12-11 DIAGNOSIS — R739 Hyperglycemia, unspecified: Secondary | ICD-10-CM

## 2011-12-11 DIAGNOSIS — R7309 Other abnormal glucose: Secondary | ICD-10-CM

## 2011-12-11 LAB — CBC WITH DIFFERENTIAL/PLATELET
Basophils Absolute: 0 10*3/uL (ref 0.0–0.1)
Basophils Relative: 0 % (ref 0.0–3.0)
Eosinophils Absolute: 0.1 10*3/uL (ref 0.0–0.7)
Eosinophils Relative: 2 % (ref 0.0–5.0)
HCT: 38.6 % (ref 36.0–46.0)
Hemoglobin: 13.3 g/dL (ref 12.0–15.0)
Lymphocytes Relative: 11.7 % — ABNORMAL LOW (ref 12.0–46.0)
Lymphs Abs: 0.8 10*3/uL (ref 0.7–4.0)
MCHC: 34.3 g/dL (ref 30.0–36.0)
MCV: 94.7 fl (ref 78.0–100.0)
Monocytes Absolute: 0.4 10*3/uL (ref 0.1–1.0)
Monocytes Relative: 6.2 % (ref 3.0–12.0)
Neutro Abs: 5.7 10*3/uL (ref 1.4–7.7)
Neutrophils Relative %: 80.1 % — ABNORMAL HIGH (ref 43.0–77.0)
Platelets: 193 10*3/uL (ref 150.0–400.0)
RBC: 4.08 Mil/uL (ref 3.87–5.11)
RDW: 14.9 % — ABNORMAL HIGH (ref 11.5–14.6)
WBC: 7.2 10*3/uL (ref 4.5–10.5)

## 2011-12-11 LAB — HEMOGLOBIN A1C: Hgb A1c MFr Bld: 5.6 % (ref 4.6–6.5)

## 2011-12-11 MED ORDER — PREDNISONE 5 MG PO TABS: 5.0000 mg | ORAL_TABLET | Freq: Every day | ORAL | Status: DC

## 2011-12-11 NOTE — Progress Notes (Signed)
Chief Complaint  Patient presents with  . Follow-up    w/ cxr and cbc. Pt states her breathing has been fine. pt c/o dry cough    History of Present Illness: Lisa Larson is a 70 y.o. 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  She was seen by Dr. Lucie Leather since last visit.  She is now on 10 mg prednisone per day.   Her breathing is okay.  She denies cough, wheeze, chest pain, fever, sweats, sinus congestion, wheeze, sputum production, sore throat, or skin rashes.  She has noticed more trouble with her vision while reading.   Past Medical History  Diagnosis Date  . 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 IGE 282 from 02-05-11  . Asthma   . Hypertension   . Osteopenia     BMD t score-1.6 at L femoral neck 11-27-2009, s/p fosamax x 5 years  . GERD (gastroesophageal reflux disease)   . Depression   . Hyperlipidemia   . Anxiety   . Baker's cyst, ruptured 2012    right    Past Surgical History  Procedure Date  . Shoulder surgery 08-2008    fracture repair, Dr. Gean Birchwood  . Total abdominal hysterectomy   . Tonsillectomy and adenoidectomy   . Colonoscopy   . Nasal sinus surgery   . Appendectomy   . Esophageal dilation     dr. Juanda Chance  . Bronchoscopy 12-2010    Dr. Craige Cotta    Current Outpatient Prescriptions on File Prior to Visit  Medication Sig Dispense Refill  . albuterol (PROAIR HFA) 108 (90 BASE) MCG/ACT inhaler Inhale 2 puffs into the lungs every 6 (six) hours as needed.  18 g  11  . allopurinol (ZYLOPRIM) 300 MG tablet 1/2 BY MOUTH DAILY  45 tablet  2  . amLODipine (NORVASC) 10 MG tablet Take 1 tablet (10 mg total) by mouth daily.  90 tablet  2  . calcium-vitamin D (OSCAL WITH D) 500-200 MG-UNIT per tablet Take 2 tablets by mouth daily.        . cetirizine (ZYRTEC) 10 MG tablet Take 10 mg by mouth daily.        . Cholecalciferol (VITAMIN D3)  1000 UNITS tablet Take 1,000 Units by mouth daily.        Marland Kitchen dextromethorphan-guaiFENesin (MUCINEX DM) 30-600 MG per 12 hr tablet Take 1 tablet by mouth every 12 (twelve) hours as needed.        . famotidine (PEPCID) 20 MG tablet Take 20 mg by mouth daily.        Marland Kitchen LORazepam (ATIVAN) 1 MG tablet Take 1 tablet (1 mg total) by mouth every 8 (eight) hours as needed for anxiety.  30 tablet  2  . losartan (COZAAR) 100 MG tablet Take 1 tablet (100 mg total) by mouth daily.  90 tablet  3  . mometasone (NASONEX) 50 MCG/ACT nasal spray Place 2 sprays into the nose daily as needed.  17 g  11  . mometasone-formoterol (DULERA) 100-5 MCG/ACT AERO Inhale 2 puffs into the lungs at bedtime. 2 puffs twice a day  1 Inhaler  11  . omeprazole (PRILOSEC) 20 MG capsule Take 1 capsule (20 mg total) by mouth daily.  90 capsule  3  . sertraline (ZOLOFT) 100 MG tablet Take 1 tablet (100 mg total) by mouth daily.  90 tablet  2  . Thiamine HCl (VITAMIN  B-1) 100 MG tablet Take 100 mg by mouth daily. As needed      . traMADol (ULTRAM) 50 MG tablet 1/2-1 by mouth every 6 hours as needed  60 tablet  1    Allergies  Allergen Reactions  . Benazepril Hcl     No PMH of angioedema; ACE-I caused cough  . Diclofenac   . Oxycodone-Aspirin   . Rofecoxib   . Sulfonamide Derivatives     Physical Exam:  Blood pressure 134/76, pulse 99, temperature 97.4 F (36.3 C), temperature source Oral, height 5' (1.524 m), weight 174 lb 9.6 oz (79.198 kg), SpO2 97.00%.  General - obese HEENT - PERRLA, EOMI, no sinus tenderness, no oral exudate, no LAN, no thyromegaly Chest - no wheeze/rales Cardiac - s1s2 regular, no murmur Abd - soft, nontender Ext - no e/c/c Neuro - normal strength Skin - no rashes Psych - normal mood, behavior  Lab Results  Component Value Date   WBC 7.2 12/11/2011   HGB 13.3 12/11/2011   HCT 38.6 12/11/2011   MCV 94.7 12/11/2011   PLT 193.0 12/11/2011   Eosinophil 2%, absolute 0.1  Dg Chest 2  View  12/11/2011  *RADIOLOGY REPORT*  Clinical Data: Cough.  CHEST - 2 VIEW  Comparison: Plain films of the chest 10/30/2011 and 12/11/2010.  Findings: Mild linear atelectasis or scar in the right middle lobe and left lower lobe noted.  Lungs otherwise clear.  Heart size normal.  No pneumothorax or effusion.  IMPRESSION: No acute finding.  Stable compared to prior exam.  Original Report Authenticated By: Bernadene Bell. Maricela Curet, M.D.     Assessment/Plan:    Outpatient Encounter Prescriptions as of 12/11/2011  Medication Sig Dispense Refill  . albuterol (PROAIR HFA) 108 (90 BASE) MCG/ACT inhaler Inhale 2 puffs into the lungs every 6 (six) hours as needed.  18 g  11  . allopurinol (ZYLOPRIM) 300 MG tablet 1/2 BY MOUTH DAILY  45 tablet  2  . amLODipine (NORVASC) 10 MG tablet Take 1 tablet (10 mg total) by mouth daily.  90 tablet  2  . calcium-vitamin D (OSCAL WITH D) 500-200 MG-UNIT per tablet Take 2 tablets by mouth daily.        . cetirizine (ZYRTEC) 10 MG tablet Take 10 mg by mouth daily.        . Cholecalciferol (VITAMIN D3) 1000 UNITS tablet Take 1,000 Units by mouth daily.        Marland Kitchen dextromethorphan-guaiFENesin (MUCINEX DM) 30-600 MG per 12 hr tablet Take 1 tablet by mouth every 12 (twelve) hours as needed.        . famotidine (PEPCID) 20 MG tablet Take 20 mg by mouth daily.        Marland Kitchen LORazepam (ATIVAN) 1 MG tablet Take 1 tablet (1 mg total) by mouth every 8 (eight) hours as needed for anxiety.  30 tablet  2  . losartan (COZAAR) 100 MG tablet Take 1 tablet (100 mg total) by mouth daily.  90 tablet  3  . mometasone (NASONEX) 50 MCG/ACT nasal spray Place 2 sprays into the nose daily as needed.  17 g  11  . mometasone-formoterol (DULERA) 100-5 MCG/ACT AERO Inhale 2 puffs into the lungs at bedtime. 2 puffs twice a day  1 Inhaler  11  . omeprazole (PRILOSEC) 20 MG capsule Take 1 capsule (20 mg total) by mouth daily.  90 capsule  3  . predniSONE (DELTASONE) 5 MG tablet Take 1 tablet (5 mg total) by  mouth daily.  Use as directed  30 tablet  3  . sertraline (ZOLOFT) 100 MG tablet Take 1 tablet (100 mg total) by mouth daily.  90 tablet  2  . Thiamine HCl (VITAMIN B-1) 100 MG tablet Take 100 mg by mouth daily. As needed      . traMADol (ULTRAM) 50 MG tablet 1/2-1 by mouth every 6 hours as needed  60 tablet  1  . DISCONTD: predniSONE (DELTASONE) 5 MG tablet Use as directed  30 tablet  3  . DISCONTD: predniSONE (DELTASONE) 5 MG tablet Take 10 mg by mouth daily. Use as directed       . DISCONTD: predniSONE (DELTASONE) 20 MG tablet Once a day        Malin Sambrano 12/11/2011, 2:58 PM

## 2011-12-11 NOTE — Telephone Encounter (Signed)
I spoke with patient about results and she verbalized understanding and had no questions 

## 2011-12-11 NOTE — Patient Instructions (Signed)
Prednisone 5 mg daily until next visit Follow up in 2 months

## 2011-12-11 NOTE — Telephone Encounter (Signed)
Will have my nurse inform patient that HbA1C was normal.  Lab Results  Component Value Date   HGBA1C 5.6 12/11/2011

## 2011-12-11 NOTE — Telephone Encounter (Signed)
I spoke with pt and made her aware these results are not in yet and will call her once they do

## 2011-12-11 NOTE — Assessment & Plan Note (Signed)
She is doing well with prednisone taper.  Will continue decrease prednisone as tolerated.  She is to use 5 mg per day until next visit.

## 2012-01-19 ENCOUNTER — Encounter: Payer: Self-pay | Admitting: Pulmonary Disease

## 2012-01-26 ENCOUNTER — Telehealth: Payer: Self-pay | Admitting: Internal Medicine

## 2012-01-26 MED ORDER — LORAZEPAM 1 MG PO TABS: 1.0000 mg | ORAL_TABLET | Freq: Three times a day (TID) | ORAL | Status: DC | PRN

## 2012-01-26 NOTE — Telephone Encounter (Signed)
Request for new prescription  Lorazepam 1mg  tablet. Take one tablet by mouth every 8 hrs as needed for anxiety.

## 2012-01-26 NOTE — Telephone Encounter (Signed)
Rx sent 

## 2012-02-24 ENCOUNTER — Telehealth: Payer: Self-pay | Admitting: Pulmonary Disease

## 2012-02-24 ENCOUNTER — Ambulatory Visit (INDEPENDENT_AMBULATORY_CARE_PROVIDER_SITE_OTHER): Payer: Managed Care, Other (non HMO) | Admitting: Pulmonary Disease

## 2012-02-24 ENCOUNTER — Encounter: Payer: Self-pay | Admitting: Pulmonary Disease

## 2012-02-24 VITALS — BP 120/76 | HR 85 | Temp 97.0°F | Ht 60.0 in | Wt 175.4 lb

## 2012-02-24 DIAGNOSIS — J309 Allergic rhinitis, unspecified: Secondary | ICD-10-CM

## 2012-02-24 DIAGNOSIS — J45909 Unspecified asthma, uncomplicated: Secondary | ICD-10-CM

## 2012-02-24 DIAGNOSIS — J8289 Other pulmonary eosinophilia, not elsewhere classified: Secondary | ICD-10-CM

## 2012-02-24 MED ORDER — PREDNISONE 2.5 MG PO TABS: 2.5000 mg | ORAL_TABLET | Freq: Every day | ORAL | Status: DC

## 2012-02-24 NOTE — Telephone Encounter (Signed)
Spoke with pt and she states that she lost her rx for prednisone that [facility] gave her. I advised will send to pharmacy electronically. Pt states nothing further needed.

## 2012-02-24 NOTE — Assessment & Plan Note (Signed)
She has been doing well.  Will decrease her prednisone to 2.5 mg daily.

## 2012-02-24 NOTE — Patient Instructions (Signed)
Prednisone 2.5 mg daily Follow up in 4 to 6 weeks

## 2012-02-24 NOTE — Assessment & Plan Note (Signed)
She is to continue her current inhaler regimen. 

## 2012-02-24 NOTE — Progress Notes (Signed)
Chief Complaint  Patient presents with  . Follow-up    reports breathing is doing well.  no new complaints.  requesting sample of nasonex    History of Present Illness: Lisa Larson is a 71 y.o. 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  She is now down to 5 mg prednisone per day.  She is not having cough, wheeze, sputum, fever, rash, hemoptysis, or gland swelling.  She does not need to use proair much.  She was approved for disability based on her arthritis.   Past Medical History  Diagnosis Date  . 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 IGE 282 from 02-05-11  . Asthma   . Hypertension   . Osteopenia     BMD t score-1.6 at L femoral neck 11-27-2009, s/p fosamax x 5 years  . GERD (gastroesophageal reflux disease)   . Depression   . Hyperlipidemia   . Anxiety   . Baker's cyst, ruptured 2012    right    Past Surgical History  Procedure Date  . Shoulder surgery 08-2008    fracture repair, Dr. Gean Birchwood  . Total abdominal hysterectomy   . Tonsillectomy and adenoidectomy   . Colonoscopy   . Nasal sinus surgery   . Appendectomy   . Esophageal dilation     dr. Juanda Chance  . Bronchoscopy 12-2010    Dr. Craige Cotta    Current Outpatient Prescriptions on File Prior to Visit  Medication Sig Dispense Refill  . albuterol (PROAIR HFA) 108 (90 BASE) MCG/ACT inhaler Inhale 2 puffs into the lungs every 6 (six) hours as needed.  18 g  11  . allopurinol (ZYLOPRIM) 300 MG tablet 1/2 BY MOUTH DAILY  45 tablet  2  . amLODipine (NORVASC) 10 MG tablet Take 1 tablet (10 mg total) by mouth daily.  90 tablet  2  . calcium-vitamin D (OSCAL WITH D) 500-200 MG-UNIT per tablet Take 2 tablets by mouth daily.        . cetirizine (ZYRTEC) 10 MG tablet Take 10 mg by mouth daily.        . Cholecalciferol (VITAMIN D3) 1000 UNITS tablet Take 1,000 Units by mouth daily.        Marland Kitchen  dextromethorphan-guaiFENesin (MUCINEX DM) 30-600 MG per 12 hr tablet Take 1 tablet by mouth every 12 (twelve) hours as needed.        . famotidine (PEPCID) 20 MG tablet Take 20 mg by mouth daily.        Marland Kitchen LORazepam (ATIVAN) 1 MG tablet Take 1 tablet (1 mg total) by mouth every 8 (eight) hours as needed for anxiety.  30 tablet  2  . losartan (COZAAR) 100 MG tablet Take 1 tablet (100 mg total) by mouth daily.  90 tablet  3  . mometasone (NASONEX) 50 MCG/ACT nasal spray Place 2 sprays into the nose daily as needed.  17 g  11  . omeprazole (PRILOSEC) 20 MG capsule Take 1 capsule (20 mg total) by mouth daily.  90 capsule  3  . sertraline (ZOLOFT) 100 MG tablet Take 1 tablet (100 mg total) by mouth daily.  90 tablet  2  . Thiamine HCl (VITAMIN B-1) 100 MG tablet Take 100 mg by mouth daily. As needed      . traMADol (ULTRAM) 50 MG tablet 1/2-1 by mouth every 6 hours as needed  60 tablet  1  Allergies  Allergen Reactions  . Benazepril Hcl     No PMH of angioedema; ACE-I caused cough  . Diclofenac   . Oxycodone-Aspirin   . Rofecoxib   . Sulfonamide Derivatives     Physical Exam:  Blood pressure 120/76, pulse 85, temperature 97 F (36.1 C), temperature source Oral, height 5' (1.524 m), weight 175 lb 6.4 oz (79.561 kg), SpO2 94.00%.  General - obese HEENT - PERRLA, EOMI, no sinus tenderness, no oral exudate, no LAN, no thyromegaly Chest - no wheeze/rales Cardiac - s1s2 regular, no murmur Abd - soft, nontender Ext - no e/c/c Neuro - normal strength Skin - no rashes Psych - normal mood, behavior  Assessment/Plan:    Outpatient Encounter Prescriptions as of 02/24/2012  Medication Sig Dispense Refill  . albuterol (PROAIR HFA) 108 (90 BASE) MCG/ACT inhaler Inhale 2 puffs into the lungs every 6 (six) hours as needed.  18 g  11  . allopurinol (ZYLOPRIM) 300 MG tablet 1/2 BY MOUTH DAILY  45 tablet  2  . amLODipine (NORVASC) 10 MG tablet Take 1 tablet (10 mg total) by mouth daily.  90 tablet   2  . calcium-vitamin D (OSCAL WITH D) 500-200 MG-UNIT per tablet Take 2 tablets by mouth daily.        . cetirizine (ZYRTEC) 10 MG tablet Take 10 mg by mouth daily.        . Cholecalciferol (VITAMIN D3) 1000 UNITS tablet Take 1,000 Units by mouth daily.        Marland Kitchen dextromethorphan-guaiFENesin (MUCINEX DM) 30-600 MG per 12 hr tablet Take 1 tablet by mouth every 12 (twelve) hours as needed.        . famotidine (PEPCID) 20 MG tablet Take 20 mg by mouth daily.        Marland Kitchen LORazepam (ATIVAN) 1 MG tablet Take 1 tablet (1 mg total) by mouth every 8 (eight) hours as needed for anxiety.  30 tablet  2  . losartan (COZAAR) 100 MG tablet Take 1 tablet (100 mg total) by mouth daily.  90 tablet  3  . mometasone (NASONEX) 50 MCG/ACT nasal spray Place 2 sprays into the nose daily as needed.  17 g  11  . mometasone-formoterol (DULERA) 100-5 MCG/ACT AERO Inhale 2 puffs into the lungs at bedtime.      Marland Kitchen omeprazole (PRILOSEC) 20 MG capsule Take 1 capsule (20 mg total) by mouth daily.  90 capsule  3  . sertraline (ZOLOFT) 100 MG tablet Take 1 tablet (100 mg total) by mouth daily.  90 tablet  2  . Thiamine HCl (VITAMIN B-1) 100 MG tablet Take 100 mg by mouth daily. As needed      . traMADol (ULTRAM) 50 MG tablet 1/2-1 by mouth every 6 hours as needed  60 tablet  1  . DISCONTD: mometasone-formoterol (DULERA) 100-5 MCG/ACT AERO Inhale 2 puffs into the lungs at bedtime. 2 puffs twice a day  1 Inhaler  11  . DISCONTD: predniSONE (DELTASONE) 5 MG tablet Take 1 tablet (5 mg total) by mouth daily. Use as directed  30 tablet  3  . predniSONE (DELTASONE) 2.5 MG tablet Take 1 tablet (2.5 mg total) by mouth daily.  30 tablet  2    Lisa Larson 02/24/2012, 3:25 PM

## 2012-02-24 NOTE — Assessment & Plan Note (Signed)
Stable

## 2012-03-02 ENCOUNTER — Telehealth: Payer: Self-pay | Admitting: Internal Medicine

## 2012-03-02 NOTE — Telephone Encounter (Signed)
Rosann Auerbach was informed that Dr.Hopper is out of office until 03/08/12.

## 2012-03-02 NOTE — Telephone Encounter (Signed)
I thought hers was a respiratory disability , but her [facility]ologist stated her Orthopedist has  Documented disability based on musculoskeletal issues. Rosann Auerbach should contact these subspecialists for clarification

## 2012-03-02 NOTE — Telephone Encounter (Signed)
Pam from [facility] called and would like to know if Dr. Alwyn Ren is responsible for the patient's work restrictions. Call back # [phone] ext [phone].

## 2012-03-02 NOTE — Telephone Encounter (Signed)
I called [employer] and left detailed message for Pam with Dr.Hopper's response, pam to return call if she has additional questions or concerns

## 2012-03-29 ENCOUNTER — Telehealth: Payer: Self-pay | Admitting: Internal Medicine

## 2012-03-29 MED ORDER — LORAZEPAM 1 MG PO TABS: ORAL_TABLET | ORAL | Status: DC

## 2012-03-29 NOTE — Telephone Encounter (Signed)
#   30 ; 1/2 -1 q 8 hrs prn only, not routinely

## 2012-03-29 NOTE — Telephone Encounter (Signed)
OK to refill

## 2012-03-29 NOTE — Telephone Encounter (Signed)
Refill: lorazepam 1mg  tablet. Take 1 tablet by mouth every 8 hours as needed for anxiety.

## 2012-04-23 ENCOUNTER — Ambulatory Visit (INDEPENDENT_AMBULATORY_CARE_PROVIDER_SITE_OTHER): Payer: Managed Care, Other (non HMO) | Admitting: Pulmonary Disease

## 2012-04-23 ENCOUNTER — Encounter: Payer: Self-pay | Admitting: Pulmonary Disease

## 2012-04-23 ENCOUNTER — Ambulatory Visit (INDEPENDENT_AMBULATORY_CARE_PROVIDER_SITE_OTHER)
Admission: RE | Admit: 2012-04-23 | Discharge: 2012-04-23 | Disposition: A | Discharge status: Home / Self Care | Payer: Managed Care, Other (non HMO) | Source: Ambulatory Visit | Attending: Pulmonary Disease | Admitting: Pulmonary Disease

## 2012-04-23 ENCOUNTER — Other Ambulatory Visit (INDEPENDENT_AMBULATORY_CARE_PROVIDER_SITE_OTHER): Payer: Managed Care, Other (non HMO)

## 2012-04-23 VITALS — BP 112/70 | HR 83 | Temp 98.1°F | Ht 60.0 in | Wt 170.4 lb

## 2012-04-23 DIAGNOSIS — J8289 Other pulmonary eosinophilia, not elsewhere classified: Secondary | ICD-10-CM

## 2012-04-23 DIAGNOSIS — J309 Allergic rhinitis, unspecified: Secondary | ICD-10-CM

## 2012-04-23 DIAGNOSIS — J45909 Unspecified asthma, uncomplicated: Secondary | ICD-10-CM

## 2012-04-23 LAB — CBC WITH DIFFERENTIAL/PLATELET
Basophils Absolute: 0 10*3/uL (ref 0.0–0.1)
Basophils Relative: 0.6 % (ref 0.0–3.0)
Eosinophils Absolute: 0.6 10*3/uL (ref 0.0–0.7)
Eosinophils Relative: 9.4 % — ABNORMAL HIGH (ref 0.0–5.0)
HCT: 41.9 % (ref 36.0–46.0)
Hemoglobin: 13.8 g/dL (ref 12.0–15.0)
Lymphocytes Relative: 14.6 % (ref 12.0–46.0)
Lymphs Abs: 1 10*3/uL (ref 0.7–4.0)
MCHC: 32.9 g/dL (ref 30.0–36.0)
MCV: 95.9 fl (ref 78.0–100.0)
Monocytes Absolute: 0.8 10*3/uL (ref 0.1–1.0)
Monocytes Relative: 11.1 % (ref 3.0–12.0)
Neutro Abs: 4.4 10*3/uL (ref 1.4–7.7)
Neutrophils Relative %: 64.3 % (ref 43.0–77.0)
Platelets: 178 10*3/uL (ref 150.0–400.0)
RBC: 4.37 Mil/uL (ref 3.87–5.11)
RDW: 14.3 % (ref 11.5–14.6)
WBC: 6.8 10*3/uL (ref 4.5–10.5)

## 2012-04-23 LAB — COMPREHENSIVE METABOLIC PANEL
ALT: 60 U/L — ABNORMAL HIGH (ref 0–35)
AST: 54 U/L — ABNORMAL HIGH (ref 0–37)
Albumin: 4.2 g/dL (ref 3.5–5.2)
Alkaline Phosphatase: 68 U/L (ref 39–117)
BUN: 16 mg/dL (ref 6–23)
CO2: 30 mEq/L (ref 19–32)
Calcium: 9.5 mg/dL (ref 8.4–10.5)
Chloride: 103 mEq/L (ref 96–112)
Creatinine, Ser: 1 mg/dL (ref 0.4–1.2)
GFR: 60.52 mL/min (ref >60.00)
Glucose, Bld: 97 mg/dL (ref 70–99)
Potassium: 4.4 mEq/L (ref 3.5–5.1)
Sodium: 141 mEq/L (ref 135–145)
Total Bilirubin: 0.6 mg/dL (ref 0.3–1.2)
Total Protein: 7.2 g/dL (ref 6.0–8.3)

## 2012-04-23 NOTE — Assessment & Plan Note (Signed)
Slightly worse with increase in pollen.  She is to continue her current regimen of nasonex and zyrtec.

## 2012-04-23 NOTE — Progress Notes (Signed)
Chief Complaint  Patient presents with  . Follow-up    Pt states she has some increase SOB due to pollen. some dry cough, very little wheezing denies any chest tx    History of Present Illness: Lisa Larson is a 71 y.o. 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  She has noticed more trouble with her allergies with increase in pollen.  She is not having much cough, wheeze, or chest congestion.  She denies fever or sweats.    She was seen by Dr. Nita Sells with dermatology for a skin lesion on her back.  She had biopsy of this done.  Pathology report from 04/02/12 showed excoriation with dermal eosinophils.   Past Medical History  Diagnosis Date  . 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 IGE 282 from 02-05-11  . Asthma   . Hypertension   . Osteopenia     BMD t score-1.6 at L femoral neck 11-27-2009, s/p fosamax x 5 years  . GERD (gastroesophageal reflux disease)   . Depression   . Hyperlipidemia   . Anxiety   . Baker's cyst, ruptured 2012    right    Past Surgical History  Procedure Date  . Shoulder surgery 08-2008    fracture repair, Dr. Gean Birchwood  . Total abdominal hysterectomy   . Tonsillectomy and adenoidectomy   . Colonoscopy   . Nasal sinus surgery   . Appendectomy   . Esophageal dilation     dr. Juanda Chance  . Bronchoscopy 12-2010    Dr. Craige Cotta    Current Outpatient Prescriptions on File Prior to Visit  Medication Sig Dispense Refill  . albuterol (PROAIR HFA) 108 (90 BASE) MCG/ACT inhaler Inhale 2 puffs into the lungs every 6 (six) hours as needed.  18 g  11  . allopurinol (ZYLOPRIM) 300 MG tablet 1/2 BY MOUTH DAILY  45 tablet  2  . amLODipine (NORVASC) 10 MG tablet Take 1 tablet (10 mg total) by mouth daily.  90 tablet  2  . calcium-vitamin D (OSCAL WITH D) 500-200 MG-UNIT per tablet Take 2 tablets by mouth daily.        . cetirizine  (ZYRTEC) 10 MG tablet Take 10 mg by mouth daily.        . Cholecalciferol (VITAMIN D3) 1000 UNITS tablet Take 1,000 Units by mouth daily.        Marland Kitchen dextromethorphan-guaiFENesin (MUCINEX DM) 30-600 MG per 12 hr tablet Take 1 tablet by mouth every 12 (twelve) hours as needed.        . famotidine (PEPCID) 20 MG tablet Take 20 mg by mouth daily.        Marland Kitchen LORazepam (ATIVAN) 1 MG tablet Take 1/2-1 tablet every 8 hours as needed. Not routinely.  30 tablet  0  . losartan (COZAAR) 100 MG tablet Take 1 tablet (100 mg total) by mouth daily.  90 tablet  3  . mometasone (NASONEX) 50 MCG/ACT nasal spray Place 2 sprays into the nose daily as needed.  17 g  11  . mometasone-formoterol (DULERA) 100-5 MCG/ACT AERO Inhale 2 puffs into the lungs at bedtime.      Marland Kitchen omeprazole (PRILOSEC) 20 MG capsule Take 1 capsule (20 mg total) by mouth daily.  90 capsule  3  . sertraline (ZOLOFT) 100 MG tablet Take 1 tablet (100 mg total) by mouth daily.  90 tablet  2  .  Thiamine HCl (VITAMIN B-1) 100 MG tablet Take 100 mg by mouth daily. As needed      . traMADol (ULTRAM) 50 MG tablet 1/2-1 by mouth every 6 hours as needed  60 tablet  1    Allergies  Allergen Reactions  . Benazepril Hcl     No PMH of angioedema; ACE-I caused cough  . Diclofenac   . Oxycodone-Aspirin   . Rofecoxib   . Sulfonamide Derivatives     Physical Exam:  Blood pressure 112/70, pulse 83, temperature 98.1 F (36.7 C), temperature source Oral, height 5' (1.524 m), weight 170 lb 6.4 oz (77.293 kg), SpO2 96.00%.  General - obese HEENT - PERRLA, EOMI, no sinus tenderness, no oral exudate, no LAN, no thyromegaly Chest - no wheeze/rales Cardiac - s1s2 regular, no murmur Abd - soft, nontender Ext - no e/c/c Neuro - normal strength Skin - healing skin biopsy site mid-lower back Psych - normal mood, behavior  Assessment/Plan:    Outpatient Encounter Prescriptions as of 04/23/2012  Medication Sig Dispense Refill  . albuterol (PROAIR HFA) 108 (90  BASE) MCG/ACT inhaler Inhale 2 puffs into the lungs every 6 (six) hours as needed.  18 g  11  . allopurinol (ZYLOPRIM) 300 MG tablet 1/2 BY MOUTH DAILY  45 tablet  2  . amLODipine (NORVASC) 10 MG tablet Take 1 tablet (10 mg total) by mouth daily.  90 tablet  2  . calcium-vitamin D (OSCAL WITH D) 500-200 MG-UNIT per tablet Take 2 tablets by mouth daily.        . cetirizine (ZYRTEC) 10 MG tablet Take 10 mg by mouth daily.        . Cholecalciferol (VITAMIN D3) 1000 UNITS tablet Take 1,000 Units by mouth daily.        Marland Kitchen dextromethorphan-guaiFENesin (MUCINEX DM) 30-600 MG per 12 hr tablet Take 1 tablet by mouth every 12 (twelve) hours as needed.        . famotidine (PEPCID) 20 MG tablet Take 20 mg by mouth daily.        Marland Kitchen LORazepam (ATIVAN) 1 MG tablet Take 1/2-1 tablet every 8 hours as needed. Not routinely.  30 tablet  0  . losartan (COZAAR) 100 MG tablet Take 1 tablet (100 mg total) by mouth daily.  90 tablet  3  . mometasone (NASONEX) 50 MCG/ACT nasal spray Place 2 sprays into the nose daily as needed.  17 g  11  . mometasone-formoterol (DULERA) 100-5 MCG/ACT AERO Inhale 2 puffs into the lungs at bedtime.      Marland Kitchen omeprazole (PRILOSEC) 20 MG capsule Take 1 capsule (20 mg total) by mouth daily.  90 capsule  3  . predniSONE (DELTASONE) 2.5 MG tablet 1 tablet daily      . sertraline (ZOLOFT) 100 MG tablet Take 1 tablet (100 mg total) by mouth daily.  90 tablet  2  . Thiamine HCl (VITAMIN B-1) 100 MG tablet Take 100 mg by mouth daily. As needed      . traMADol (ULTRAM) 50 MG tablet 1/2-1 by mouth every 6 hours as needed  60 tablet  1    Lesley Atkin 04/23/2012, 4:18 PM

## 2012-04-23 NOTE — Assessment & Plan Note (Signed)
She is to continue her current inhaler regimen. 

## 2012-04-23 NOTE — Patient Instructions (Signed)
Chest xray and lab tests today>>will call with results Follow up in 6 weeks

## 2012-04-23 NOTE — Assessment & Plan Note (Signed)
Her respiratory symptoms have been stable.  Will continue 2.5 mg prednisone pending results of her tests.  Will repeat chest xray and lab results today, and call her with results.  She also had recent skin biopsy which showed eosinophils.  Will need to d/w Dr. Margo Aye with dermatology whether this may be related to her lung process.

## 2012-04-24 ENCOUNTER — Other Ambulatory Visit: Payer: Self-pay | Admitting: Internal Medicine

## 2012-04-26 ENCOUNTER — Telehealth: Payer: Self-pay | Admitting: *Deleted

## 2012-04-26 NOTE — Telephone Encounter (Signed)
Pt states that she would like to get a Rx for clonazepam instead of the lorazepam. Pt also notes that in the pass she has only been given a 10 day supply of the med and would like to get the quantity increased.  Last filled 03-29-12 #30.

## 2012-04-26 NOTE — Telephone Encounter (Signed)
I'm sorry but these are not equivalent agents; lorazepam would need to be weaned and the clonazepam initiated slowly. Please make an office visit

## 2012-04-27 NOTE — Telephone Encounter (Signed)
Called patient to schedule OV to discuss medication change. Pt states she is going through a lot right now with her husband's failing health, and she is under much stress. Patient was upset and crying on the phone while talking about her husband. I scheduled her for next available appt, tomorrow 04/28/12 @ 12:00p.

## 2012-04-28 ENCOUNTER — Encounter: Payer: Self-pay | Admitting: Internal Medicine

## 2012-04-28 ENCOUNTER — Telehealth: Payer: Self-pay | Admitting: Pulmonary Disease

## 2012-04-28 ENCOUNTER — Ambulatory Visit (INDEPENDENT_AMBULATORY_CARE_PROVIDER_SITE_OTHER): Payer: Managed Care, Other (non HMO) | Admitting: Internal Medicine

## 2012-04-28 VITALS — BP 140/82 | HR 77 | Temp 97.8°F | Wt 169.0 lb

## 2012-04-28 DIAGNOSIS — F32A Depression, unspecified: Secondary | ICD-10-CM

## 2012-04-28 DIAGNOSIS — R51 Headache: Secondary | ICD-10-CM

## 2012-04-28 DIAGNOSIS — F329 Major depressive disorder, single episode, unspecified: Secondary | ICD-10-CM

## 2012-04-28 DIAGNOSIS — F419 Anxiety disorder, unspecified: Secondary | ICD-10-CM

## 2012-04-28 DIAGNOSIS — F411 Generalized anxiety disorder: Secondary | ICD-10-CM

## 2012-04-28 MED ORDER — CLONAZEPAM 0.5 MG PO TABS: ORAL_TABLET | ORAL | Status: DC

## 2012-04-28 NOTE — Telephone Encounter (Signed)
BMET    Component Value Date/Time   NA 141 04/23/2012 1641   K 4.4 04/23/2012 1641   CL 103 04/23/2012 1641   CO2 30 04/23/2012 1641   GLUCOSE 97 04/23/2012 1641   BUN 16 04/23/2012 1641   CREATININE 1.0 04/23/2012 1641   CALCIUM 9.5 04/23/2012 1641   GFRNONAA 83.04 12/11/2010 1158   GFRAA 108 09/19/2008 0952    CBC    Component Value Date/Time   WBC 6.8 04/23/2012 1641   RBC 4.37 04/23/2012 1641   HGB 13.8 04/23/2012 1641   HCT 41.9 04/23/2012 1641   PLT 178.0 04/23/2012 1641   MCV 95.9 04/23/2012 1641   MCHC 32.9 04/23/2012 1641   RDW 14.3 04/23/2012 1641   LYMPHSABS 1.0 04/23/2012 1641   MONOABS 0.8 04/23/2012 1641   EOSABS 0.6 04/23/2012 1641   BASOSABS 0.0 04/23/2012 1641    Lab Results  Component Value Date   ALT 60* 04/23/2012   AST 54* 04/23/2012   ALKPHOS 68 04/23/2012   BILITOT 0.6 04/23/2012   Dg Chest 2 View  04/23/2012  *RADIOLOGY REPORT*  Clinical Data: Ex-smoker with COPD.  Shortness of breath.  CHEST - 2 VIEW  Comparison: 12/11/2011 and 10/30/2011.  Findings: The heart size and mediastinal contours are stable. There is stable linear scarring bilaterally.  No significant hyperinflation, confluent airspace opacity or significant pleural effusion is seen.  There are diffuse osteophytes of the thoracic spine.  IMPRESSION: Stable chronic lung disease with linear scarring bilaterally.  No acute cardiopulmonary process.  Original Report Authenticated By: Gerrianne Scale, M.D.     Attempted to call pt to discuss results.   Will have my nurse inform patient that her eosinophil level is still elevated, but better than before.  Other labs and chest xray improved.  She needs to continue prednisone 2.5 mg daily until next visit.

## 2012-04-28 NOTE — Patient Instructions (Signed)
Alcohol If you drink, do it moderately - 1 drink per day or less.   Please consider counselling services through social services.  Do not take alcohol within 6 hours of the clonazepam. Stop lorazepam

## 2012-04-28 NOTE — Telephone Encounter (Signed)
Pt is requesting her lab and cxr results Please advise Dr. Craige Cotta, thanks

## 2012-04-28 NOTE — Progress Notes (Signed)
  Subjective:    Patient ID: Lisa Larson, female    DOB: 1941/10/16, 71 y.o.   MRN: [id number]  HPI She is inquiring about changing from lorazepam to clonazepam. Because of her husband's severe, advanced health issues; she does not feel lorazepam has been effective.  She is on sertraline milligrams daily. She been taking lorazepam 1 mg one half pill twice a day.  In the last 2 weeks she struck her head on a cabinet; she did not lose consciousness. Since that time she decreased her alcohol intake from 4 glasses of wine a night to roughly 2.  She describes mixed anxiety and depression but denies panic attacks.  For disability has been terminated. She'll lose her insurance shortly     Review of Systems She has had some nausea since the head injury with intermittent headache. There's been no loss of consciousness.  She is unable to characterize the headache. It is diffuse over the entire head. It will last up to all day. She's also had some discomfort in her neck  She's had some chronic hearing loss.  She recently had cataract surgery bilaterally.     Objective:   Physical Exam  Gen. appearance: Well-nourished, in no distress Eyes: Extraocular motion intact, field of vision normal, vision grossly intact, no nystagmus ENT: Canals clear, tympanic membranes normal,  hearing grossly normal Neck: Normal range of motion but pain with lateral rotation, no masses, normal thyroid Cardiovascular: Rate and rhythm normal; no murmurs, gallops or extra heart sounds Muscle skeletal: Osteoarthritic finger changes, tone, &  strength normal Neuro:no cranial nerve deficit, deep tendon  reflexes normal. She limps when she walks. Romberg testing with finger-nose testing is normal. Oriented x3 Lymph: No cervical or axillary LA Skin: Warm and dry without suspicious lesions or rashes Psych: Affect flat but  interactive and cooperative. She exhibits lack of insight into the risk of the concomitant  tranquilizer & heavy alcohol intake.        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 No neurovascular deficit present  Plan: I discussed the risk of fall and health or life compromise with present situation with her son in attendance. Counseling would be invaluable if this is a possibility.

## 2012-04-29 NOTE — Telephone Encounter (Signed)
I spoke with patient about results and she verbalized understanding and had no questions 

## 2012-04-30 ENCOUNTER — Telehealth: Payer: Self-pay

## 2012-04-30 ENCOUNTER — Telehealth: Payer: Self-pay | Admitting: Pulmonary Disease

## 2012-04-30 NOTE — Telephone Encounter (Signed)
Left message to call office

## 2012-04-30 NOTE — Telephone Encounter (Signed)
Called, spoke with Lisa Larson.  Lisa Larson states she had a skin bx positive eosinophil.  States Dr. Craige Cotta was going to discuss this with Dr. Margo Aye to see if this could be contributing to her lung condition.  She is calling to see if Dr. Craige Cotta has spoke with Dr. Margo Aye yet.  Lisa Larson aware Dr. Craige Cotta will be back in the office on Monday and ok with this.  Dr. Craige Cotta, pls advise.  Thank you.

## 2012-04-30 NOTE — Telephone Encounter (Signed)
These cannot be taken together; she has to be either on the clonazepam or lorazepam, but not both. Neither should be taken within 8 hours of heavy alcohol intake. Please call prescription for lorazepam #30 pharmacy. Jury letter was written

## 2012-04-30 NOTE — Telephone Encounter (Signed)
Patient called to say she does not like the way Clonazepam makes her feel (nauseated and just doesn't feel well) . Patient would like to switch back to Lorazepam. Patient will need a new rx for Lorazepam sent to [facility] on [address].  Dr.Hopper please advise

## 2012-05-04 NOTE — Telephone Encounter (Signed)
Spoke with Dr. Margo Aye.  He reviewed path report, and said it is not unusually to have eosinophils in skin lesions, and this is a non-specific finding.  He advised nothing extra/different needed to be done for skin lesion as long as it continues to improve.  He is not sure if this is related to her eosinophilic pneumonia.  Left message on pt's voicemail advising of above.  Advised she is to continue her current dose of prednisone, and will discuss further at next visit.

## 2012-05-04 NOTE — Telephone Encounter (Signed)
lmomtcb to verify pt received Dr. Evlyn Courier VM

## 2012-05-06 MED ORDER — PREDNISONE 2.5 MG PO TABS: 2.5000 mg | ORAL_TABLET | Freq: Every day | ORAL | Status: DC

## 2012-05-06 NOTE — Telephone Encounter (Signed)
Pt advised. Mason Burleigh, CMA  

## 2012-05-11 MED ORDER — LORAZEPAM 1 MG PO TABS: 1.0000 mg | ORAL_TABLET | Freq: Three times a day (TID) | ORAL | Status: AC | PRN

## 2012-05-11 NOTE — Telephone Encounter (Signed)
Discuss with patient who states that she is not taking the clonazepam.

## 2012-05-14 ENCOUNTER — Telehealth: Payer: Self-pay | Admitting: Internal Medicine

## 2012-05-14 MED ORDER — AMLODIPINE BESYLATE 10 MG PO TABS: 10.0000 mg | ORAL_TABLET | Freq: Every day | ORAL | Status: DC

## 2012-05-14 NOTE — Telephone Encounter (Signed)
Refill: Amlodipine besylate 10mg  tab. Take one tablet by mouth daily. Qty 90. Last fill 05-12-12

## 2012-05-14 NOTE — Telephone Encounter (Signed)
RX sent

## 2012-06-14 ENCOUNTER — Other Ambulatory Visit: Payer: Self-pay | Admitting: Pulmonary Disease

## 2012-07-15 ENCOUNTER — Other Ambulatory Visit: Payer: Self-pay | Admitting: *Deleted

## 2012-07-15 DIAGNOSIS — I10 Essential (primary) hypertension: Secondary | ICD-10-CM

## 2012-07-15 MED ORDER — LORAZEPAM 1 MG PO TABS: ORAL_TABLET | ORAL | Status: DC

## 2012-07-15 MED ORDER — SERTRALINE HCL 100 MG PO TABS: 100.0000 mg | ORAL_TABLET | Freq: Every day | ORAL | Status: DC

## 2012-07-15 MED ORDER — LOSARTAN POTASSIUM 100 MG PO TABS: 100.0000 mg | ORAL_TABLET | Freq: Every day | ORAL | Status: DC

## 2012-07-15 NOTE — Telephone Encounter (Signed)
Lorazepam # 30; other 2 # 90

## 2012-07-15 NOTE — Telephone Encounter (Signed)
Last OV 04-28-12, lorazepam last filled 04-30-12 #30, celexa last filled #90 2. Pt states that she just got her medicare approve so she would like to get hard Rx so she can shop around to see where med are cheaper at...Marland KitchenMarland KitchenPlease advise

## 2012-07-15 NOTE — Telephone Encounter (Signed)
Rx printed awaiting signature. 

## 2012-07-21 ENCOUNTER — Ambulatory Visit: Payer: Managed Care, Other (non HMO) | Admitting: Pulmonary Disease

## 2012-08-19 ENCOUNTER — Ambulatory Visit (INDEPENDENT_AMBULATORY_CARE_PROVIDER_SITE_OTHER)
Admission: RE | Admit: 2012-08-19 | Discharge: 2012-08-19 | Disposition: A | Discharge status: Home / Self Care | Payer: Medicare Other | Source: Ambulatory Visit | Attending: Pulmonary Disease | Admitting: Pulmonary Disease

## 2012-08-19 ENCOUNTER — Encounter: Payer: Self-pay | Admitting: Pulmonary Disease

## 2012-08-19 ENCOUNTER — Other Ambulatory Visit (INDEPENDENT_AMBULATORY_CARE_PROVIDER_SITE_OTHER): Payer: Medicare Other

## 2012-08-19 ENCOUNTER — Ambulatory Visit (INDEPENDENT_AMBULATORY_CARE_PROVIDER_SITE_OTHER): Payer: Medicare Other | Admitting: Pulmonary Disease

## 2012-08-19 VITALS — BP 130/70 | HR 94 | Temp 97.9°F | Ht 61.0 in | Wt 167.7 lb

## 2012-08-19 DIAGNOSIS — T50904A Poisoning by unspecified drugs, medicaments and biological substances, undetermined, initial encounter: Secondary | ICD-10-CM

## 2012-08-19 DIAGNOSIS — J8289 Other pulmonary eosinophilia, not elsewhere classified: Secondary | ICD-10-CM

## 2012-08-19 DIAGNOSIS — J8281 Chronic eosinophilic pneumonia: Secondary | ICD-10-CM

## 2012-08-19 DIAGNOSIS — J4540 Moderate persistent asthma, uncomplicated: Secondary | ICD-10-CM

## 2012-08-19 DIAGNOSIS — R7309 Other abnormal glucose: Secondary | ICD-10-CM | POA: Diagnosis not present

## 2012-08-19 DIAGNOSIS — J45909 Unspecified asthma, uncomplicated: Secondary | ICD-10-CM

## 2012-08-19 DIAGNOSIS — R739 Hyperglycemia, unspecified: Secondary | ICD-10-CM

## 2012-08-19 DIAGNOSIS — T50905A Adverse effect of unspecified drugs, medicaments and biological substances, initial encounter: Secondary | ICD-10-CM

## 2012-08-19 LAB — COMPREHENSIVE METABOLIC PANEL
ALT: 66 U/L — ABNORMAL HIGH (ref 0–35)
AST: 75 U/L — ABNORMAL HIGH (ref 0–37)
Albumin: 4.4 g/dL (ref 3.5–5.2)
Alkaline Phosphatase: 71 U/L (ref 39–117)
BUN: 13 mg/dL (ref 6–23)
CO2: 28 mEq/L (ref 19–32)
Calcium: 9.9 mg/dL (ref 8.4–10.5)
Chloride: 101 mEq/L (ref 96–112)
Creatinine, Ser: 0.8 mg/dL (ref 0.4–1.2)
GFR: 71.38 mL/min (ref >60.00)
Glucose, Bld: 97 mg/dL (ref 70–99)
Potassium: 4.5 mEq/L (ref 3.5–5.1)
Sodium: 137 mEq/L (ref 135–145)
Total Bilirubin: 0.9 mg/dL (ref 0.3–1.2)
Total Protein: 7.6 g/dL (ref 6.0–8.3)

## 2012-08-19 LAB — CBC WITH DIFFERENTIAL/PLATELET
Basophils Absolute: 0 10*3/uL (ref 0.0–0.1)
Basophils Relative: 0.3 % (ref 0.0–3.0)
Eosinophils Absolute: 0.7 10*3/uL (ref 0.0–0.7)
Eosinophils Relative: 10.2 % — ABNORMAL HIGH (ref 0.0–5.0)
HCT: 41.5 % (ref 36.0–46.0)
Hemoglobin: 13.9 g/dL (ref 12.0–15.0)
Lymphocytes Relative: 15 % (ref 12.0–46.0)
Lymphs Abs: 1.1 10*3/uL (ref 0.7–4.0)
MCHC: 33.4 g/dL (ref 30.0–36.0)
MCV: 97.3 fl (ref 78.0–100.0)
Monocytes Absolute: 0.7 10*3/uL (ref 0.1–1.0)
Monocytes Relative: 10 % (ref 3.0–12.0)
Neutro Abs: 4.6 10*3/uL (ref 1.4–7.7)
Neutrophils Relative %: 64.5 % (ref 43.0–77.0)
Platelets: 174 10*3/uL (ref 150.0–400.0)
RBC: 4.26 Mil/uL (ref 3.87–5.11)
RDW: 14.1 % (ref 11.5–14.6)
WBC: 7.1 10*3/uL (ref 4.5–10.5)

## 2012-08-19 LAB — HEMOGLOBIN A1C: Hgb A1c MFr Bld: 5.1 % (ref 4.6–6.5)

## 2012-08-19 MED ORDER — MOMETASONE FURO-FORMOTEROL FUM 100-5 MCG/ACT IN AERO: 2.0000 | INHALATION_SPRAY | Freq: Every day | RESPIRATORY_TRACT | Status: DC

## 2012-08-19 NOTE — Assessment & Plan Note (Signed)
Will resume dulera and continue prn albuterol.  Will need to arrange for f/u PFT's at next visit.

## 2012-08-19 NOTE — Patient Instructions (Signed)
Chest xray and labs today>>will call with results Dulera two puffs twice per day, and rinse mouth after each use Follow up in 6 to 8 weeks

## 2012-08-19 NOTE — Progress Notes (Signed)
Chief Complaint  Patient presents with  . Follow-up    still SOB w/ exertion, wheezing, some cough, occasional chest hurting. denies ay runny nose, PND. has not taken the dulera in 2 weeks bc she ran out    History of Present Illness: Lisa Larson is a 71 y.o. 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  She ran out of her dulera.  She noticed more cough, wheeze, and dyspnea after this.  She denies fever.  She still has sore on her back.  She denies gland swelling, or joint swelling.  She remains on 2.5 mg prednisone daily.    Past Medical History  Diagnosis Date  . 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 IGE 282 from 02-05-11  . Asthma   . Hypertension   . Osteopenia     BMD t score-1.6 at L femoral neck 11-27-2009, s/p fosamax x 5 years  . GERD (gastroesophageal reflux disease)   . Depression   . Hyperlipidemia   . Anxiety   . Baker's cyst, ruptured 2012    right    Past Surgical History  Procedure Date  . Shoulder surgery 08-2008    fracture repair, Dr. Gean Birchwood  . Total abdominal hysterectomy   . Tonsillectomy and adenoidectomy   . Colonoscopy   . Nasal sinus surgery   . Appendectomy   . Esophageal dilation     dr. Juanda Chance  . Bronchoscopy 12-2010    Dr. Craige Cotta    Current Outpatient Prescriptions on File Prior to Visit  Medication Sig Dispense Refill  . albuterol (PROAIR HFA) 108 (90 BASE) MCG/ACT inhaler Inhale 2 puffs into the lungs every 6 (six) hours as needed.  18 g  11  . allopurinol (ZYLOPRIM) 300 MG tablet 1/2 BY MOUTH DAILY  45 tablet  2  . amLODipine (NORVASC) 10 MG tablet Take 1 tablet (10 mg total) by mouth daily.  90 tablet  2  . calcium-vitamin D (OSCAL WITH D) 500-200 MG-UNIT per tablet Take 2 tablets by mouth daily.        . cetirizine (ZYRTEC) 10 MG tablet Take 10 mg by mouth daily.        . Cholecalciferol (VITAMIN D3)  1000 UNITS tablet Take 1,000 Units by mouth daily.        Marland Kitchen dextromethorphan-guaiFENesin (MUCINEX DM) 30-600 MG per 12 hr tablet Take 1 tablet by mouth every 12 (twelve) hours as needed.        . famotidine (PEPCID) 20 MG tablet Take 20 mg by mouth daily.        Marland Kitchen LORazepam (ATIVAN) 1 MG tablet Take 1 tablet (1 mg total) by mouth every 8 (eight) hours as needed for anxiety.  30 tablet  0  . losartan (COZAAR) 100 MG tablet Take 1 tablet (100 mg total) by mouth daily.  90 tablet  0  . mometasone (NASONEX) 50 MCG/ACT nasal spray Place 2 sprays into the nose daily as needed.  17 g  11  . mometasone-formoterol (DULERA) 100-5 MCG/ACT AERO Inhale 2 puffs into the lungs at bedtime.      Marland Kitchen omeprazole (PRILOSEC) 20 MG capsule TAKE ONE CAPSULE BY MOUTH EVERY DAY  90 capsule  1  . predniSONE (DELTASONE) 2.5 MG tablet Take 1 tablet (2.5 mg total) by mouth daily. 1 tablet daily  30 tablet  6  . sertraline (ZOLOFT) 100 MG tablet Take  1 tablet (100 mg total) by mouth daily.  90 tablet  0  . Thiamine HCl (VITAMIN B-1) 100 MG tablet Take 100 mg by mouth daily. As needed      . traMADol (ULTRAM) 50 MG tablet 1/2-1 by mouth every 6 hours as needed  60 tablet  1    Allergies  Allergen Reactions  . Benazepril Hcl     No PMH of angioedema; ACE-I caused cough  . Diclofenac   . Oxycodone-Aspirin   . Rofecoxib   . Sulfonamide Derivatives     Physical Exam:  Blood pressure 130/70, pulse 94, temperature 97.9 F (36.6 C), temperature source Oral, height 5\' 1"  (1.549 m), weight 167 lb 11.2 oz (76.068 kg), SpO2 92.00%.  Body mass index is 31.69 kg/(m^2). Wt Readings from Last 3 Encounters:  08/19/12 167 lb 11.2 oz (76.068 kg)  04/28/12 169 lb (76.658 kg)  04/23/12 170 lb 6.4 oz (77.293 kg)    General - obese HEENT - PERRLA, EOMI, no sinus tenderness, no oral exudate, no LAN, no thyromegaly Chest - no wheeze/rales Cardiac - s1s2 regular, no murmur Abd - soft, nontender Ext - no e/c/c Neuro - normal  strength Skin - healing skin biopsy site mid-lower back Psych - normal mood, behavior  Assessment/Plan:    Outpatient Encounter Prescriptions as of 08/19/2012  Medication Sig Dispense Refill  . albuterol (PROAIR HFA) 108 (90 BASE) MCG/ACT inhaler Inhale 2 puffs into the lungs every 6 (six) hours as needed.  18 g  11  . allopurinol (ZYLOPRIM) 300 MG tablet 1/2 BY MOUTH DAILY  45 tablet  2  . amLODipine (NORVASC) 10 MG tablet Take 1 tablet (10 mg total) by mouth daily.  90 tablet  2  . calcium-vitamin D (OSCAL WITH D) 500-200 MG-UNIT per tablet Take 2 tablets by mouth daily.        . cetirizine (ZYRTEC) 10 MG tablet Take 10 mg by mouth daily.        . Cholecalciferol (VITAMIN D3) 1000 UNITS tablet Take 1,000 Units by mouth daily.        Marland Kitchen dextromethorphan-guaiFENesin (MUCINEX DM) 30-600 MG per 12 hr tablet Take 1 tablet by mouth every 12 (twelve) hours as needed.        . famotidine (PEPCID) 20 MG tablet Take 20 mg by mouth daily.        Marland Kitchen LORazepam (ATIVAN) 1 MG tablet Take 1 tablet (1 mg total) by mouth every 8 (eight) hours as needed for anxiety.  30 tablet  0  . losartan (COZAAR) 100 MG tablet Take 1 tablet (100 mg total) by mouth daily.  90 tablet  0  . mometasone (NASONEX) 50 MCG/ACT nasal spray Place 2 sprays into the nose daily as needed.  17 g  11  . mometasone-formoterol (DULERA) 100-5 MCG/ACT AERO Inhale 2 puffs into the lungs at bedtime.      Marland Kitchen omeprazole (PRILOSEC) 20 MG capsule TAKE ONE CAPSULE BY MOUTH EVERY DAY  90 capsule  1  . predniSONE (DELTASONE) 2.5 MG tablet Take 1 tablet (2.5 mg total) by mouth daily. 1 tablet daily  30 tablet  6  . sertraline (ZOLOFT) 100 MG tablet Take 1 tablet (100 mg total) by mouth daily.  90 tablet  0  . Thiamine HCl (VITAMIN B-1) 100 MG tablet Take 100 mg by mouth daily. As needed      . traMADol (ULTRAM) 50 MG tablet 1/2-1 by mouth every 6 hours as needed  60 tablet  1  . DISCONTD: clonazePAM (KLONOPIN) 0.5 MG tablet 1 qhs prn  30 tablet  0  .  DISCONTD: predniSONE (DELTASONE) 2.5 MG tablet TAKE 1 TABLET BY MOUTH EVERY DAY  30 tablet  1    Tarrin Lebow 08/19/2012, 10:37 AM

## 2012-08-19 NOTE — Assessment & Plan Note (Signed)
Will repeat chest xray and labs today.  Will call with results, and discuss further about plans for prednisone.

## 2012-08-20 ENCOUNTER — Telehealth: Payer: Self-pay | Admitting: Pulmonary Disease

## 2012-08-20 DIAGNOSIS — J8281 Chronic eosinophilic pneumonia: Secondary | ICD-10-CM

## 2012-08-20 MED ORDER — PREDNISONE 10 MG PO TABS: 10.0000 mg | ORAL_TABLET | Freq: Every day | ORAL | Status: DC

## 2012-08-20 NOTE — Telephone Encounter (Signed)
I spoke with pt and is aware I have placed order for her cbc w/ diff to be done on 09/02/12. Nothing further was needed

## 2012-08-20 NOTE — Telephone Encounter (Signed)
CXR and Lab tests from 08/19/12 reviewed with pt.   CXR no change.  Labs showed increased eosinophil count.  Will increase prednisone to 10 mg daily>>new script sent.  Will have my nurse schedule repeat CBC with differential for September 02, 2012.   Advised her to d/w PCP about refilling ativan.

## 2012-09-03 ENCOUNTER — Other Ambulatory Visit (INDEPENDENT_AMBULATORY_CARE_PROVIDER_SITE_OTHER): Payer: Medicare Other

## 2012-09-03 ENCOUNTER — Telehealth: Payer: Self-pay | Admitting: Pulmonary Disease

## 2012-09-03 DIAGNOSIS — J8289 Other pulmonary eosinophilia, not elsewhere classified: Secondary | ICD-10-CM

## 2012-09-03 DIAGNOSIS — J8281 Chronic eosinophilic pneumonia: Secondary | ICD-10-CM

## 2012-09-03 LAB — CBC WITH DIFFERENTIAL/PLATELET
Basophils Absolute: 0 10*3/uL (ref 0.0–0.1)
Basophils Relative: 0.1 % (ref 0.0–3.0)
Eosinophils Absolute: 0.2 10*3/uL (ref 0.0–0.7)
Eosinophils Relative: 3 % (ref 0.0–5.0)
HCT: 40.5 % (ref 36.0–46.0)
Hemoglobin: 13.6 g/dL (ref 12.0–15.0)
Lymphocytes Relative: 25.3 % (ref 12.0–46.0)
Lymphs Abs: 2 10*3/uL (ref 0.7–4.0)
MCHC: 33.5 g/dL (ref 30.0–36.0)
MCV: 97.3 fl (ref 78.0–100.0)
Monocytes Absolute: 1 10*3/uL (ref 0.1–1.0)
Monocytes Relative: 12.2 % — ABNORMAL HIGH (ref 3.0–12.0)
Neutro Abs: 4.8 10*3/uL (ref 1.4–7.7)
Neutrophils Relative %: 59.4 % (ref 43.0–77.0)
Platelets: 172 10*3/uL (ref 150.0–400.0)
RBC: 4.16 Mil/uL (ref 3.87–5.11)
RDW: 14 % (ref 11.5–14.6)
WBC: 8 10*3/uL (ref 4.5–10.5)

## 2012-09-03 MED ORDER — PREDNISONE 10 MG PO TABS: 5.0000 mg | ORAL_TABLET | Freq: Every day | ORAL | Status: DC

## 2012-09-03 NOTE — Telephone Encounter (Signed)
CBC    Component Value Date/Time   WBC 8.0 09/03/2012 0955   RBC 4.16 09/03/2012 0955   HGB 13.6 09/03/2012 0955   HCT 40.5 09/03/2012 0955   PLT 172.0 09/03/2012 0955   MCV 97.3 09/03/2012 0955   MCHC 33.5 09/03/2012 0955   RDW 14.0 09/03/2012 0955   LYMPHSABS 2.0 09/03/2012 0955   MONOABS 1.0 09/03/2012 0955   EOSABS 0.2 09/03/2012 0955   BASOSABS 0.0 09/03/2012 0955   Results d/w pt over phone.  She is feeling better.  Will have her change prednisone to 5 mg daily until next visit.

## 2012-09-08 ENCOUNTER — Telehealth: Payer: Self-pay | Admitting: Internal Medicine

## 2012-09-08 MED ORDER — AMLODIPINE BESYLATE 10 MG PO TABS: 10.0000 mg | ORAL_TABLET | Freq: Every day | ORAL | Status: DC

## 2012-09-08 NOTE — Telephone Encounter (Signed)
Refill: Amlodipine besylate 10mg .

## 2012-09-08 NOTE — Telephone Encounter (Signed)
RX sent

## 2012-10-12 ENCOUNTER — Encounter: Payer: Self-pay | Admitting: Pulmonary Disease

## 2012-10-12 ENCOUNTER — Other Ambulatory Visit (INDEPENDENT_AMBULATORY_CARE_PROVIDER_SITE_OTHER): Payer: Medicare Other

## 2012-10-12 ENCOUNTER — Ambulatory Visit (INDEPENDENT_AMBULATORY_CARE_PROVIDER_SITE_OTHER): Payer: Medicare Other | Admitting: Pulmonary Disease

## 2012-10-12 ENCOUNTER — Ambulatory Visit (INDEPENDENT_AMBULATORY_CARE_PROVIDER_SITE_OTHER)
Admission: RE | Admit: 2012-10-12 | Discharge: 2012-10-12 | Disposition: A | Discharge status: Home / Self Care | Payer: Medicare Other | Source: Ambulatory Visit | Attending: Pulmonary Disease | Admitting: Pulmonary Disease

## 2012-10-12 VITALS — BP 130/72 | HR 82 | Temp 98.1°F | Ht 61.0 in | Wt 167.0 lb

## 2012-10-12 DIAGNOSIS — R918 Other nonspecific abnormal finding of lung field: Secondary | ICD-10-CM | POA: Diagnosis not present

## 2012-10-12 DIAGNOSIS — J309 Allergic rhinitis, unspecified: Secondary | ICD-10-CM | POA: Diagnosis not present

## 2012-10-12 DIAGNOSIS — H6121 Impacted cerumen, right ear: Secondary | ICD-10-CM | POA: Insufficient documentation

## 2012-10-12 DIAGNOSIS — Z23 Encounter for immunization: Secondary | ICD-10-CM

## 2012-10-12 DIAGNOSIS — J45909 Unspecified asthma, uncomplicated: Secondary | ICD-10-CM

## 2012-10-12 DIAGNOSIS — H6120 Impacted cerumen, unspecified ear: Secondary | ICD-10-CM

## 2012-10-12 DIAGNOSIS — J8281 Chronic eosinophilic pneumonia: Secondary | ICD-10-CM

## 2012-10-12 DIAGNOSIS — J8289 Other pulmonary eosinophilia, not elsewhere classified: Secondary | ICD-10-CM

## 2012-10-12 DIAGNOSIS — J4540 Moderate persistent asthma, uncomplicated: Secondary | ICD-10-CM

## 2012-10-12 LAB — CBC WITH DIFFERENTIAL/PLATELET
Basophils Absolute: 0 10*3/uL (ref 0.0–0.1)
Basophils Relative: 0.4 % (ref 0.0–3.0)
Eosinophils Absolute: 0.3 10*3/uL (ref 0.0–0.7)
Eosinophils Relative: 4.7 % (ref 0.0–5.0)
HCT: 38.9 % (ref 36.0–46.0)
Hemoglobin: 13.1 g/dL (ref 12.0–15.0)
Lymphocytes Relative: 17.9 % (ref 12.0–46.0)
Lymphs Abs: 1.3 10*3/uL (ref 0.7–4.0)
MCHC: 33.6 g/dL (ref 30.0–36.0)
MCV: 97.9 fl (ref 78.0–100.0)
Monocytes Absolute: 0.8 10*3/uL (ref 0.1–1.0)
Monocytes Relative: 10.5 % (ref 3.0–12.0)
Neutro Abs: 4.8 10*3/uL (ref 1.4–7.7)
Neutrophils Relative %: 66.5 % (ref 43.0–77.0)
Platelets: 189 10*3/uL (ref 150.0–400.0)
RBC: 3.97 Mil/uL (ref 3.87–5.11)
RDW: 14.5 % (ref 11.5–14.6)
WBC: 7.3 10*3/uL (ref 4.5–10.5)

## 2012-10-12 MED ORDER — PREDNISONE 5 MG PO TABS: ORAL_TABLET | ORAL | Status: DC

## 2012-10-12 MED ORDER — MOMETASONE FURO-FORMOTEROL FUM 100-5 MCG/ACT IN AERO: 2.0000 | INHALATION_SPRAY | RESPIRATORY_TRACT | Status: DC | PRN

## 2012-10-12 NOTE — Patient Instructions (Signed)
Prednisone 5 mg daily Chest xray and blood test today>>will call with results Will schedule PFT>>will call with results Dulera two puffs as needed for cough, wheeze, or chest congestion Nasonex two sprays as needed for sinus congestion Try debrox for ear wax>>can get this over the counter Flu shot today Follow up in 8 weeks

## 2012-10-12 NOTE — Progress Notes (Signed)
Chief Complaint  Patient presents with  . Follow-up    breathing unchanged, occasional wheezing, occaisonal cough. dulera costs her $230  . Medication Refill    prednisone, nasonex, dulera    History of Present Illness: Lisa Larson is a 71 y.o. 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  She has been doing okay.  She is not having much cough.  Her daughter told her she was wheezing last week, but she didn't feel like she was.  She denies fever, sweats, sputum, chest pain, or hemoptysis.  The rash on her back has resolved.  She uses her dulera several times per week.  This helps.  She is concerned about how much this costs.  She is using her proair about twice per week.  She remains on  5 mg prednisone daily.  Tests: CBC 01/14/11>>39% eosinophils 01/14/11>> HIV negative, ACE 39, RF negative, ANA 1:40, ANCA negative  CT chest 01/16/11>>Multifocal b/l ASD with GGO BAL 01/21/11>>48% Eosinophils  IgE 02/25/11>>282 CT chest 07/22/11>>resolution of ASD CT chest 10/03/11>>recurrence of nodular infiltrate Rt upper lobe 10/08/11>>resume prednisone 10/08/11 CXR 08/19/12>>no acute findings  Past Medical History  Diagnosis Date  . 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 Asthma   . Hypertension   . Osteopenia     BMD t score-1.6 at L femoral neck 11-27-2009, s/p fosamax x 5 years  . GERD (gastroesophageal reflux disease)   . Depression   . Hyperlipidemia   . Anxiety   . Baker's cyst, ruptured 2012    right    Past Surgical History  Procedure Date  . Shoulder surgery 08-2008    fracture repair, Dr. Gean Birchwood  . Total abdominal hysterectomy   . Tonsillectomy and adenoidectomy   . Colonoscopy   . Nasal sinus surgery   . Appendectomy   . Esophageal dilation     dr. Juanda Chance  . Bronchoscopy 12-2010    Dr. Craige Cotta    Current Outpatient Prescriptions on File Prior to Visit  Medication Sig Dispense Refill  . albuterol (PROAIR HFA) 108 (90 BASE) MCG/ACT inhaler Inhale 2  puffs into the lungs every 6 (six) hours as needed.  18 g  11  . allopurinol (ZYLOPRIM) 300 MG tablet 1/2 BY MOUTH DAILY  45 tablet  2  . amLODipine (NORVASC) 10 MG tablet Take 1 tablet (10 mg total) by mouth daily.  90 tablet  1  . calcium-vitamin D (OSCAL WITH D) 500-200 MG-UNIT per tablet Take 2 tablets by mouth daily.        Marland Kitchen LORazepam (ATIVAN) 1 MG tablet Take 1 tablet (1 mg total) by mouth every 8 (eight) hours as needed for anxiety.  30 tablet  0  . losartan (COZAAR) 100 MG tablet Take 1 tablet (100 mg total) by mouth daily.  90 tablet  0  . mometasone (NASONEX) 50 MCG/ACT nasal spray Place 2 sprays into the nose daily as needed.  17 g  11  . omeprazole (PRILOSEC) 20 MG capsule TAKE ONE CAPSULE BY MOUTH EVERY DAY  90 capsule  1  . sertraline (ZOLOFT) 100 MG tablet Take 1 tablet (100 mg total) by mouth daily.  90 tablet  0  . Thiamine HCl (VITAMIN B-1) 100 MG tablet Take 100 mg by mouth daily. As needed      . traMADol (ULTRAM) 50 MG tablet 1/2-1 by mouth every 6 hours as needed  60 tablet  1  . DISCONTD: mometasone-formoterol (DULERA) 100-5 MCG/ACT AERO Inhale 2 puffs into the lungs at  bedtime.  1 Inhaler  5    Allergies  Allergen Reactions  . Benazepril Hcl     No PMH of angioedema; ACE-I caused cough  . Diclofenac   . Oxycodone-Aspirin   . Rofecoxib   . Sulfonamide Derivatives     Physical Exam:  Filed Vitals:   10/12/12 1345 10/12/12 1346  BP:  130/72  Pulse:  82  Temp: 98.1 F (36.7 C)   TempSrc: Oral   Height: 5\' 1"  (1.549 m)   Weight: 167 lb (75.751 kg)   SpO2:  96%    Body mass index is 31.55 kg/(m^2).  Wt Readings from Last 3 Encounters:  10/12/12 167 lb (75.751 kg)  08/19/12 167 lb 11.2 oz (76.068 kg)  04/28/12 169 lb (76.658 kg)    General - obese HEENT - no sinus tenderness, no oral exudate, no LAN, b/l cerumen build up Chest - no wheeze/rales/dullness Cardiac - s1s2 regular, no murmur Abd - soft, nontender Ext - no edema Neuro - normal  strength Skin - well healed skin biopsy site mid-lower back Psych - normal mood, behavior  Assessment/Plan:    Outpatient Encounter Prescriptions as of 10/12/2012  Medication Sig Dispense Refill  . albuterol (PROAIR HFA) 108 (90 BASE) MCG/ACT inhaler Inhale 2 puffs into the lungs every 6 (six) hours as needed.  18 g  11  . allopurinol (ZYLOPRIM) 300 MG tablet 1/2 BY MOUTH DAILY  45 tablet  2  . amLODipine (NORVASC) 10 MG tablet Take 1 tablet (10 mg total) by mouth daily.  90 tablet  1  . calcium-vitamin D (OSCAL WITH D) 500-200 MG-UNIT per tablet Take 2 tablets by mouth daily.        Marland Kitchen LORazepam (ATIVAN) 1 MG tablet Take 1 tablet (1 mg total) by mouth every 8 (eight) hours as needed for anxiety.  30 tablet  0  . losartan (COZAAR) 100 MG tablet Take 1 tablet (100 mg total) by mouth daily.  90 tablet  0  . mometasone (NASONEX) 50 MCG/ACT nasal spray Place 2 sprays into the nose daily as needed.  17 g  11  . mometasone-formoterol (DULERA) 100-5 MCG/ACT AERO Inhale 2 puffs into the lungs as needed (Cough, wheezing, chest congestion).  1 Inhaler  5  . omeprazole (PRILOSEC) 20 MG capsule TAKE ONE CAPSULE BY MOUTH EVERY DAY  90 capsule  1  . sertraline (ZOLOFT) 100 MG tablet Take 1 tablet (100 mg total) by mouth daily.  90 tablet  0  . Thiamine HCl (VITAMIN B-1) 100 MG tablet Take 100 mg by mouth daily. As needed      . traMADol (ULTRAM) 50 MG tablet 1/2-1 by mouth every 6 hours as needed  60 tablet  1  . DISCONTD: mometasone-formoterol (DULERA) 100-5 MCG/ACT AERO Inhale 2 puffs into the lungs at bedtime.  1 Inhaler  5  . DISCONTD: predniSONE (DELTASONE) 10 MG tablet Take 0.5 tablets (5 mg total) by mouth daily.  30 tablet  5  . predniSONE (DELTASONE) 5 MG tablet Use as directed  30 tablet  3  . DISCONTD: cetirizine (ZYRTEC) 10 MG tablet Take 10 mg by mouth daily.        Marland Kitchen DISCONTD: Cholecalciferol (VITAMIN D3) 1000 UNITS tablet Take 1,000 Units by mouth daily.        Marland Kitchen DISCONTD:  dextromethorphan-guaiFENesin (MUCINEX DM) 30-600 MG per 12 hr tablet Take 1 tablet by mouth every 12 (twelve) hours as needed.        Marland Kitchen DISCONTD:  famotidine (PEPCID) 20 MG tablet Take 20 mg by mouth daily.          Takeela Peil 10/12/2012, 2:23 PM

## 2012-10-12 NOTE — Assessment & Plan Note (Addendum)
Stable.  She is getting benefit from prednisone therapy for this also.  She is concerned about the expense of her inhalers.  I have given her a sample of dulera.  She can use dulera and/or proair as needed for cough, wheeze, or chest congestion.  Will check her PFT's.

## 2012-10-12 NOTE — Assessment & Plan Note (Signed)
Stable current regimen of prednisone 5 mg daily.  Will repeat CXR and CBC with diff>>will call with results and discuss whether she can decrease her prednisone further.

## 2012-10-12 NOTE — Assessment & Plan Note (Signed)
Stable.  She can use nasonex as needed.  I have given her sample of this.

## 2012-10-12 NOTE — Assessment & Plan Note (Signed)
She can try OTC debrox.  If this does not help, then she is to d/w her PCP.

## 2012-10-13 ENCOUNTER — Telehealth: Payer: Self-pay | Admitting: Pulmonary Disease

## 2012-10-13 DIAGNOSIS — M1710 Unilateral primary osteoarthritis, unspecified knee: Secondary | ICD-10-CM | POA: Diagnosis not present

## 2012-10-13 MED ORDER — PREDNISONE 2.5 MG PO TABS: ORAL_TABLET | ORAL | Status: DC

## 2012-10-13 NOTE — Telephone Encounter (Signed)
Dg Chest 2 View  10/12/2012  *RADIOLOGY REPORT*  Clinical Data: Follow-up evaluation for pneumonia.  CHEST - 2 VIEW  Comparison: Chest x-ray 08/19/2012.  Findings: Linear opacities in the mid lungs bilaterally, unchanged compared to the prior examination, favored to represent areas of scarring.  No acute consolidative airspace disease.  No pleural effusions.  Mild elevation of the left hemidiaphragm is unchanged. Pulmonary vasculature is within normal limits.  Heart size is upper limits of normal.  Mediastinal contours are unremarkable. Atherosclerosis in the thoracic aorta.  IMPRESSION: 1.  No radiographic evidence of acute cardiopulmonary disease.  The appearance of the chest is essentially unchanged, as above.   Original Report Authenticated By: Florencia Reasons, M.D.    CBC    Component Value Date/Time   WBC 7.3 10/12/2012 1432   RBC 3.97 10/12/2012 1432   HGB 13.1 10/12/2012 1432   HCT 38.9 10/12/2012 1432   PLT 189.0 10/12/2012 1432   MCV 97.9 10/12/2012 1432   MCHC 33.6 10/12/2012 1432   RDW 14.5 10/12/2012 1432   LYMPHSABS 1.3 10/12/2012 1432   MONOABS 0.8 10/12/2012 1432   EOSABS 0.3 10/12/2012 1432   BASOSABS 0.0 10/12/2012 1432    Discussed results with pt.  She is to change prednisone to 5 mg every other day alternating with 2.5 mg every other day.

## 2012-10-16 ENCOUNTER — Other Ambulatory Visit: Payer: Self-pay | Admitting: Internal Medicine

## 2012-10-18 NOTE — Telephone Encounter (Signed)
Uric Acid (995.20/Gout)

## 2012-10-25 ENCOUNTER — Other Ambulatory Visit: Payer: Self-pay | Admitting: Internal Medicine

## 2012-10-25 ENCOUNTER — Telehealth: Payer: Self-pay | Admitting: Internal Medicine

## 2012-10-25 MED ORDER — SERTRALINE HCL 100 MG PO TABS: 100.0000 mg | ORAL_TABLET | Freq: Every day | ORAL | Status: DC

## 2012-10-25 NOTE — Telephone Encounter (Signed)
Refill Sertraline

## 2012-10-25 NOTE — Telephone Encounter (Signed)
Refill: Sertraline 100 mg.

## 2012-10-25 NOTE — Telephone Encounter (Signed)
RX sent

## 2012-11-04 ENCOUNTER — Ambulatory Visit (INDEPENDENT_AMBULATORY_CARE_PROVIDER_SITE_OTHER): Payer: Medicare Other | Admitting: Pulmonary Disease

## 2012-11-04 DIAGNOSIS — J8289 Other pulmonary eosinophilia, not elsewhere classified: Secondary | ICD-10-CM

## 2012-11-04 LAB — PULMONARY FUNCTION TEST

## 2012-11-04 NOTE — Progress Notes (Signed)
PFT done today. 

## 2012-11-09 ENCOUNTER — Telehealth: Payer: Self-pay | Admitting: Pulmonary Disease

## 2012-11-09 NOTE — Telephone Encounter (Signed)
PFT 11/04/12>>FEV1 1.77 (87%), FEV1% 68, TLC 4.87 (101%), DLCO 69%, +BD from FEF 25-75%.  Results reviewed with pt.  No change to current treatment plan.

## 2012-11-17 ENCOUNTER — Encounter: Payer: Self-pay | Admitting: Pulmonary Disease

## 2012-11-19 ENCOUNTER — Other Ambulatory Visit: Payer: Self-pay | Admitting: Internal Medicine

## 2012-11-19 NOTE — Telephone Encounter (Signed)
Rx sent.    MW 

## 2012-11-26 ENCOUNTER — Other Ambulatory Visit: Payer: Self-pay | Admitting: Internal Medicine

## 2012-11-26 NOTE — Telephone Encounter (Signed)
Refill done.  

## 2012-12-07 ENCOUNTER — Encounter: Payer: Self-pay | Admitting: Pulmonary Disease

## 2012-12-07 ENCOUNTER — Ambulatory Visit (INDEPENDENT_AMBULATORY_CARE_PROVIDER_SITE_OTHER): Payer: Medicare Other | Admitting: Pulmonary Disease

## 2012-12-07 VITALS — BP 120/74 | HR 76 | Temp 98.3°F | Ht 61.0 in | Wt 169.2 lb

## 2012-12-07 DIAGNOSIS — J8289 Other pulmonary eosinophilia, not elsewhere classified: Secondary | ICD-10-CM | POA: Diagnosis not present

## 2012-12-07 DIAGNOSIS — J309 Allergic rhinitis, unspecified: Secondary | ICD-10-CM | POA: Diagnosis not present

## 2012-12-07 DIAGNOSIS — J45909 Unspecified asthma, uncomplicated: Secondary | ICD-10-CM

## 2012-12-07 DIAGNOSIS — J4540 Moderate persistent asthma, uncomplicated: Secondary | ICD-10-CM

## 2012-12-07 MED ORDER — PREDNISONE 2.5 MG PO TABS: 2.5000 mg | ORAL_TABLET | Freq: Every day | ORAL | Status: DC

## 2012-12-07 MED ORDER — CETIRIZINE HCL 10 MG PO CAPS: 10.0000 mg | ORAL_CAPSULE | Freq: Every day | ORAL | Status: DC | PRN

## 2012-12-07 MED ORDER — BECLOMETHASONE DIPROPIONATE 80 MCG/ACT IN AERS: 2.0000 | INHALATION_SPRAY | Freq: Two times a day (BID) | RESPIRATORY_TRACT | Status: DC

## 2012-12-07 NOTE — Assessment & Plan Note (Signed)
Clinically stable.  Will decrease prednisone to 2.5 mg daily.  Will plan to follow up in 8 weeks with chest xray and CBC with differential.

## 2012-12-07 NOTE — Patient Instructions (Signed)
Stop dulera Qvar two puffs twice per day, and rinse mouth after each use Change prednisone to 2.5 mg (one pill) daily Follow up in 8 weeks with Chest xray and CBC with differential

## 2012-12-07 NOTE — Progress Notes (Signed)
Chief Complaint  Patient presents with  . Follow-up    Pt reports breathing is doing well, dulera has helped (pt reports too expensive), also reports having a dry cough x 1-2 weeks-- would like rx for zyrrtec     History of Present Illness: Lisa Larson is a 71 y.o. 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  She has been doing okay.  She has noticed a dry cough over the past one week.  She denies sinus congestion, post nasal drip, fever, wheeze, swelling, or rash.  She is concerned about expense of dulera.  She wants a script for cetirizine.  Tests: CBC 01/14/11>>39% eosinophils 01/14/11>> HIV negative, ACE 39, RF negative, ANA 1:40, ANCA negative  CT chest 01/16/11>>Multifocal b/l ASD with GGO BAL 01/21/11>>48% Eosinophils  IgE 02/25/11>>282 CT chest 07/22/11>>resolution of ASD CT chest 10/03/11>>recurrence of nodular infiltrate Rt upper lobe 10/08/11>>resume prednisone 10/08/11 CXR 08/19/12>>no acute findings PFT 11/04/12>>FEV1 1.77 (87%), FEV1% 68, TLC 4.87 (101%), DLCO 69%, +BD from FEF 25-75%.  Past Medical History  Diagnosis Date  . 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 Asthma   . Hypertension   . Osteopenia     BMD t score-1.6 at L femoral neck 11-27-2009, s/p fosamax x 5 years  . GERD (gastroesophageal reflux disease)   . Depression   . Hyperlipidemia   . Anxiety   . Baker's cyst, ruptured 2012    right    Past Surgical History  Procedure Date  . Shoulder surgery 08-2008    fracture repair, Dr. Gean Birchwood  . Total abdominal hysterectomy   . Tonsillectomy and adenoidectomy   . Colonoscopy   . Nasal sinus surgery   . Appendectomy   . Esophageal dilation     dr. Juanda Chance  . Bronchoscopy 12-2010    Dr. Craige Cotta    Current Outpatient Prescriptions on File Prior to Visit  Medication Sig Dispense Refill  . albuterol (PROAIR HFA) 108 (90 BASE) MCG/ACT inhaler Inhale 2 puffs into the lungs every 6 (six) hours as needed.  18 g  11  . allopurinol  (ZYLOPRIM) 300 MG tablet TAKE 1/2 TABLET BY MOUTH EVERY DAY  15 tablet  0  . amLODipine (NORVASC) 10 MG tablet Take 1 tablet (10 mg total) by mouth daily.  90 tablet  1  . calcium-vitamin D (OSCAL WITH D) 500-200 MG-UNIT per tablet Take 2 tablets by mouth daily.        Marland Kitchen LORazepam (ATIVAN) 1 MG tablet TAKE 1 TABLET BY MOUTH EVERY 8 HOURS AS NEEDED FOR ANXIETY  30 tablet  0  . losartan (COZAAR) 100 MG tablet TAKE 1 TABLET BY MOUTH EVERY DAY  30 tablet  5  . mometasone (NASONEX) 50 MCG/ACT nasal spray Place 2 sprays into the nose daily as needed.  17 g  11  . mometasone-formoterol (DULERA) 100-5 MCG/ACT AERO Inhale 2 puffs into the lungs as needed (Cough, wheezing, chest congestion).  1 Inhaler  5  . omeprazole (PRILOSEC) 20 MG capsule TAKE ONE CAPSULE BY MOUTH EVERY DAY  90 capsule  1  . predniSONE (DELTASONE) 2.5 MG tablet 2 pills every other day, alternating with 1 pill every other day  45 tablet  3  . sertraline (ZOLOFT) 100 MG tablet Take 1 tablet (100 mg total) by mouth daily.  90 tablet  1  . Thiamine HCl (VITAMIN B-1) 100 MG tablet Take 100 mg by mouth daily. As needed      . traMADol (ULTRAM) 50 MG tablet 1/2-1 by  mouth every 6 hours as needed  60 tablet  1    Allergies  Allergen Reactions  . Benazepril Hcl     No PMH of angioedema; ACE-I caused cough  . Diclofenac   . Oxycodone-Aspirin   . Rofecoxib   . Sulfonamide Derivatives     Physical Exam:  Filed Vitals:   12/07/12 1345  BP: 120/74  Pulse: 76  Temp: 98.3 F (36.8 C)  TempSrc: Oral  Height: 5\' 1"  (1.549 m)  Weight: 169 lb 3.2 oz (76.749 kg)  SpO2: 98%    Current Encounter SPO2  12/07/12 1345 98%  10/12/12 1346 96%    Body mass index is 31.97 kg/(m^2).   Wt Readings from Last 3 Encounters:  12/07/12 169 lb 3.2 oz (76.749 kg)  10/12/12 167 lb (75.751 kg)  08/19/12 167 lb 11.2 oz (76.068 kg)    General - obese HEENT - no sinus tenderness, no oral exudate, no LAN Chest - no wheeze/rales/dullness Cardiac  - s1s2 regular, no murmur Abd - soft, nontender Ext - no edema Neuro - normal strength Skin - no rash Psych - normal mood, behavior  Assessment/Plan:  [name], MD [facility] Pulmonary/Critical Care 12/07/2012, 1:53 PM Pager:  [phone] After 3pm call: [phone]

## 2012-12-07 NOTE — Assessment & Plan Note (Signed)
I have given sample of nasonex.  I have written script for cetirizine.

## 2012-12-07 NOTE — Assessment & Plan Note (Signed)
Stable.  Will change from dulera to Qvar.  I have given her sample of Qvar.

## 2012-12-20 ENCOUNTER — Other Ambulatory Visit: Payer: Self-pay | Admitting: Internal Medicine

## 2012-12-31 ENCOUNTER — Other Ambulatory Visit: Payer: Self-pay | Admitting: Internal Medicine

## 2012-12-31 NOTE — Telephone Encounter (Signed)
Uric Acid (DX gout(

## 2013-02-02 ENCOUNTER — Other Ambulatory Visit: Payer: Self-pay | Admitting: Internal Medicine

## 2013-02-02 DIAGNOSIS — M109 Gout, unspecified: Secondary | ICD-10-CM

## 2013-02-02 DIAGNOSIS — T887XXA Unspecified adverse effect of drug or medicament, initial encounter: Secondary | ICD-10-CM

## 2013-02-02 NOTE — Telephone Encounter (Signed)
Future orders placed 

## 2013-02-21 ENCOUNTER — Ambulatory Visit (INDEPENDENT_AMBULATORY_CARE_PROVIDER_SITE_OTHER)
Admission: RE | Admit: 2013-02-21 | Discharge: 2013-02-21 | Disposition: A | Discharge status: Home / Self Care | Payer: Medicare Other | Source: Ambulatory Visit | Attending: Pulmonary Disease | Admitting: Pulmonary Disease

## 2013-02-21 ENCOUNTER — Telehealth: Payer: Self-pay | Admitting: Pulmonary Disease

## 2013-02-21 ENCOUNTER — Ambulatory Visit (INDEPENDENT_AMBULATORY_CARE_PROVIDER_SITE_OTHER): Payer: Medicare Other | Admitting: Pulmonary Disease

## 2013-02-21 ENCOUNTER — Encounter: Payer: Self-pay | Admitting: Pulmonary Disease

## 2013-02-21 ENCOUNTER — Other Ambulatory Visit (INDEPENDENT_AMBULATORY_CARE_PROVIDER_SITE_OTHER): Payer: Medicare Other

## 2013-02-21 VITALS — BP 108/62 | HR 71 | Temp 97.3°F | Ht 61.0 in | Wt 168.8 lb

## 2013-02-21 DIAGNOSIS — J984 Other disorders of lung: Secondary | ICD-10-CM | POA: Diagnosis not present

## 2013-02-21 DIAGNOSIS — R059 Cough, unspecified: Secondary | ICD-10-CM | POA: Diagnosis not present

## 2013-02-21 DIAGNOSIS — R05 Cough: Secondary | ICD-10-CM | POA: Diagnosis not present

## 2013-02-21 DIAGNOSIS — J8289 Other pulmonary eosinophilia, not elsewhere classified: Secondary | ICD-10-CM | POA: Diagnosis not present

## 2013-02-21 DIAGNOSIS — J309 Allergic rhinitis, unspecified: Secondary | ICD-10-CM | POA: Diagnosis not present

## 2013-02-21 DIAGNOSIS — J45909 Unspecified asthma, uncomplicated: Secondary | ICD-10-CM | POA: Diagnosis not present

## 2013-02-21 DIAGNOSIS — J4540 Moderate persistent asthma, uncomplicated: Secondary | ICD-10-CM

## 2013-02-21 DIAGNOSIS — R110 Nausea: Secondary | ICD-10-CM | POA: Insufficient documentation

## 2013-02-21 LAB — CBC WITH DIFFERENTIAL/PLATELET
Basophils Absolute: 0 10*3/uL (ref 0.0–0.1)
Basophils Relative: 0.2 % (ref 0.0–3.0)
Eosinophils Absolute: 0.5 10*3/uL (ref 0.0–0.7)
Eosinophils Relative: 7.7 % — ABNORMAL HIGH (ref 0.0–5.0)
HCT: 37.4 % (ref 36.0–46.0)
Hemoglobin: 12.7 g/dL (ref 12.0–15.0)
Lymphocytes Relative: 20.8 % (ref 12.0–46.0)
Lymphs Abs: 1.2 10*3/uL (ref 0.7–4.0)
MCHC: 33.9 g/dL (ref 30.0–36.0)
MCV: 93.6 fl (ref 78.0–100.0)
Monocytes Absolute: 0.6 10*3/uL (ref 0.1–1.0)
Monocytes Relative: 10.9 % (ref 3.0–12.0)
Neutro Abs: 3.5 10*3/uL (ref 1.4–7.7)
Neutrophils Relative %: 60.4 % (ref 43.0–77.0)
Platelets: 185 10*3/uL (ref 150.0–400.0)
RBC: 3.99 Mil/uL (ref 3.87–5.11)
RDW: 13.4 % (ref 11.5–14.6)
WBC: 5.9 10*3/uL (ref 4.5–10.5)

## 2013-02-21 MED ORDER — ONDANSETRON HCL 4 MG PO TABS: 4.0000 mg | ORAL_TABLET | Freq: Three times a day (TID) | ORAL | Status: DC | PRN

## 2013-02-21 MED ORDER — PREDNISONE 2.5 MG PO TABS: 2.5000 mg | ORAL_TABLET | ORAL | Status: DC

## 2013-02-21 MED ORDER — MOMETASONE FURO-FORMOTEROL FUM 100-5 MCG/ACT IN AERO: 2.0000 | INHALATION_SPRAY | Freq: Two times a day (BID) | RESPIRATORY_TRACT | Status: DC

## 2013-02-21 NOTE — Progress Notes (Signed)
Chief Complaint  Patient presents with  . Follow-up    w/ CXR and labs. Breathing is ok, c/o dry cough, slight increase in wheezing but no chest tx.     History of Present Illness: Lisa Larson is a 72 y.o. 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  She has been using proair daily since she switched from [place] to Qvar.  She denies sputum, hemoptysis, fever, rash, wheeze, or swelling.  She had episode of stomach virus two weeks ago.  She had diarrhea and vomiting for about 36 hours.  This is improved, but she still feels nauseous.  She is using 2.5 mg prednisone daily.  Tests: CBC 01/14/11>>39% eosinophils 01/14/11>> HIV negative, ACE 39, RF negative, ANA 1:40, ANCA negative  CT chest 01/16/11>>Multifocal b/l ASD with GGO BAL 01/21/11>>48% Eosinophils  IgE 02/25/11>>282 CT chest 07/22/11>>resolution of ASD CT chest 10/03/11>>recurrence of nodular infiltrate Rt upper lobe 10/08/11>>resume prednisone 10/08/11 CXR 08/19/12>>no acute findings PFT 11/04/12>>FEV1 1.77 (87%), FEV1% 68, TLC 4.87 (101%), DLCO 69%, +BD from FEF 25-75%. CXR 02/21/13 >> Rt lung scarring, no change  Past Medical History  Diagnosis Date  . 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 Asthma   . Hypertension   . Osteopenia     BMD t score-1.6 at L femoral neck 11-27-2009, s/p fosamax x 5 years  . GERD (gastroesophageal reflux disease)   . Depression   . Hyperlipidemia   . Anxiety   . Baker's cyst, ruptured 2012    right    Past Surgical History  Procedure Laterality Date  . Shoulder surgery  08-2008    fracture repair, Dr. Gean Birchwood  . Total abdominal hysterectomy    . Tonsillectomy and adenoidectomy    . Colonoscopy    . Nasal sinus surgery    . Appendectomy    . Esophageal dilation      dr. Juanda Chance  . Bronchoscopy  12-2010    Dr. Craige Cotta    Current Outpatient Prescriptions on File Prior to Visit  Medication Sig Dispense Refill  . albuterol (PROAIR HFA) 108 (90 BASE) MCG/ACT inhaler  Inhale 2 puffs into the lungs every 6 (six) hours as needed.  18 g  11  . allopurinol (ZYLOPRIM) 300 MG tablet LABS OVERDUE, 1/2 by mouth daily  15 tablet  0  . amLODipine (NORVASC) 10 MG tablet Take 1 tablet (10 mg total) by mouth daily.  90 tablet  1  . LORazepam (ATIVAN) 1 MG tablet TAKE 1 TABLET BY MOUTH EVERY 8 HOURS AS NEEDED FOR ANXIETY  30 tablet  0  . losartan (COZAAR) 100 MG tablet TAKE 1 TABLET BY MOUTH EVERY DAY  30 tablet  5  . mometasone (NASONEX) 50 MCG/ACT nasal spray Place 2 sprays into the nose daily as needed.  17 g  11  . omeprazole (PRILOSEC) 20 MG capsule TAKE ONE CAPSULE BY MOUTH EVERY DAY  90 capsule  1  . predniSONE (DELTASONE) 2.5 MG tablet Take 1 tablet (2.5 mg total) by mouth daily.      . sertraline (ZOLOFT) 100 MG tablet TAKE 1 TABLET BY MOUTH EVERY DAY  90 tablet  1  . traMADol (ULTRAM) 50 MG tablet 1/2-1 by mouth every 6 hours as needed  60 tablet  1   No current facility-administered medications on file prior to visit.    Allergies  Allergen Reactions  . Benazepril Hcl     No PMH of angioedema; ACE-I caused cough  . Diclofenac   . Oxycodone-Aspirin   .  Rofecoxib   . Sulfonamide Derivatives     Physical Exam:  Filed Vitals:   02/21/13 1047 02/21/13 1049  BP:  108/62  Pulse:  71  Temp: 97.3 F (36.3 C)   TempSrc: Oral   Height: 5\' 1"  (1.549 m)   Weight: 168 lb 12.8 oz (76.567 kg)   SpO2:  97%    Current Encounter SPO2  02/21/13 1049 97%  12/07/12 1345 98%    Body mass index is 31.91 kg/(m^2).   Wt Readings from Last 3 Encounters:  02/21/13 168 lb 12.8 oz (76.567 kg)  12/07/12 169 lb 3.2 oz (76.749 kg)  10/12/12 167 lb (75.751 kg)    General - obese HEENT - no sinus tenderness, no oral exudate, no LAN Chest - no wheeze/rales/dullness Cardiac - s1s2 regular, no murmur Abd - soft, nontender Ext - no edema Neuro - normal strength Skin - no rash Psych - normal mood, behavior  Dg Chest 2 View  02/21/2013  *RADIOLOGY REPORT*   Clinical Data: Follow up pneumonia, cough  CHEST - 2 VIEW  Comparison: 10/12/2012  Findings: Stable scarring in the right mid/lower lung.  Bibasilar opacities, possibly atelectasis. No pleural effusion or pneumothorax.  Heart is top normal in size.  Degenerative changes of the visualized thoracolumbar spine.  IMPRESSION: No evidence of acute cardiopulmonary disease.  Stable right lower lobe scarring.   Original Report Authenticated By: Charline Bills, M.D.     CBC    Component Value Date/Time   WBC 5.9 02/21/2013 1036   RBC 3.99 02/21/2013 1036   HGB 12.7 02/21/2013 1036   HCT 37.4 02/21/2013 1036   PLT 185.0 02/21/2013 1036   MCV 93.6 02/21/2013 1036   MCHC 33.9 02/21/2013 1036   RDW 13.4 02/21/2013 1036   LYMPHSABS 1.2 02/21/2013 1036   MONOABS 0.6 02/21/2013 1036   EOSABS 0.5 02/21/2013 1036   BASOSABS 0.0 02/21/2013 1036      Assessment/Plan:  [name], MD [facility] Pulmonary/Critical Care 02/21/2013, 10:50 AM Pager:  [phone] After 3pm call: [phone]

## 2013-02-21 NOTE — Assessment & Plan Note (Signed)
Stable.  Will call her with results of CBC.  Will decrease prednisone to 2.5 mg every other day.

## 2013-02-21 NOTE — Assessment & Plan Note (Signed)
She has frequent use of proair since change from dulera to Qvar.  Her main concern with dulera was expense, but Qvar is no better.  Will restart dulera, and have her stop Qvar.

## 2013-02-21 NOTE — Patient Instructions (Signed)
Stop Qvar Dulera two puffs twice per day, and rinse mouth after each use Change prednisone to 2.5 mg pill every other day until next visit Zofran 4 mg as needed for nausea Follow up in 8 weeks

## 2013-02-21 NOTE — Assessment & Plan Note (Signed)
She had a stomach virus two weeks ago, and has residual nausea.  Will give her script for prn zofran.

## 2013-02-21 NOTE — Telephone Encounter (Signed)
Please advise Dr. Sood thanks 

## 2013-02-21 NOTE — Assessment & Plan Note (Signed)
I have given sample of nasonex.

## 2013-02-22 NOTE — Telephone Encounter (Signed)
As discussed during her office visit on 02/21/13, please inform pt that CXR showed scarring in Rt lung, but otherwise normal.  Please also inform her that lab tests were okay also.  No change to treatment plan as detailed on 02/21/13.

## 2013-02-22 NOTE — Telephone Encounter (Signed)
PT informed of lab results per Dr Craige Cotta.

## 2013-03-01 ENCOUNTER — Other Ambulatory Visit: Payer: Self-pay | Admitting: Internal Medicine

## 2013-03-02 NOTE — Telephone Encounter (Signed)
Left message on VM for patient to return call when available, reason for call (not left on VM) patient will need to sign contract and pick up RX

## 2013-03-15 DIAGNOSIS — Z79899 Other long term (current) drug therapy: Secondary | ICD-10-CM | POA: Diagnosis not present

## 2013-04-04 ENCOUNTER — Ambulatory Visit (INDEPENDENT_AMBULATORY_CARE_PROVIDER_SITE_OTHER): Payer: Medicare Other | Admitting: Internal Medicine

## 2013-04-04 ENCOUNTER — Encounter: Payer: Self-pay | Admitting: Internal Medicine

## 2013-04-04 VITALS — BP 128/78 | HR 79 | Temp 98.1°F | Resp 14 | Ht 60.9 in | Wt 165.0 lb

## 2013-04-04 DIAGNOSIS — F3289 Other specified depressive episodes: Secondary | ICD-10-CM

## 2013-04-04 DIAGNOSIS — F329 Major depressive disorder, single episode, unspecified: Secondary | ICD-10-CM | POA: Diagnosis not present

## 2013-04-04 DIAGNOSIS — M109 Gout, unspecified: Secondary | ICD-10-CM | POA: Diagnosis not present

## 2013-04-04 DIAGNOSIS — M899 Disorder of bone, unspecified: Secondary | ICD-10-CM | POA: Diagnosis not present

## 2013-04-04 DIAGNOSIS — E785 Hyperlipidemia, unspecified: Secondary | ICD-10-CM | POA: Diagnosis not present

## 2013-04-04 DIAGNOSIS — Z23 Encounter for immunization: Secondary | ICD-10-CM | POA: Diagnosis not present

## 2013-04-04 DIAGNOSIS — Z0000 Encounter for general adult medical examination without abnormal findings: Secondary | ICD-10-CM | POA: Diagnosis not present

## 2013-04-04 DIAGNOSIS — M949 Disorder of cartilage, unspecified: Secondary | ICD-10-CM

## 2013-04-04 DIAGNOSIS — I10 Essential (primary) hypertension: Secondary | ICD-10-CM | POA: Diagnosis not present

## 2013-04-04 LAB — BASIC METABOLIC PANEL
BUN: 9 mg/dL (ref 6–23)
CO2: 28 mEq/L (ref 19–32)
Calcium: 9.7 mg/dL (ref 8.4–10.5)
Chloride: 101 mEq/L (ref 96–112)
Creatinine, Ser: 0.8 mg/dL (ref 0.4–1.2)
GFR: 71.25 mL/min (ref >60.00)
Glucose, Bld: 106 mg/dL — ABNORMAL HIGH (ref 70–99)
Potassium: 3.8 mEq/L (ref 3.5–5.1)
Sodium: 138 mEq/L (ref 135–145)

## 2013-04-04 LAB — HEPATIC FUNCTION PANEL
ALT: 38 U/L — ABNORMAL HIGH (ref 0–35)
AST: 43 U/L — ABNORMAL HIGH (ref 0–37)
Albumin: 4.2 g/dL (ref 3.5–5.2)
Alkaline Phosphatase: 77 U/L (ref 39–117)
Bilirubin, Direct: 0.1 mg/dL (ref 0.0–0.3)
Total Bilirubin: 0.8 mg/dL (ref 0.3–1.2)
Total Protein: 7.6 g/dL (ref 6.0–8.3)

## 2013-04-04 LAB — LIPID PANEL
Cholesterol: 215 mg/dL — ABNORMAL HIGH (ref 0–200)
HDL: 54.7 mg/dL (ref >39.00)
Total CHOL/HDL Ratio: 4
Triglycerides: 131 mg/dL (ref 0.0–149.0)
VLDL: 26.2 mg/dL (ref 0.0–40.0)

## 2013-04-04 LAB — LDL CHOLESTEROL, DIRECT: Direct LDL: 132.6 mg/dL

## 2013-04-04 LAB — TSH: TSH: 2.32 u[IU]/mL (ref 0.35–5.50)

## 2013-04-04 LAB — URIC ACID: Uric Acid, Serum: 3.5 mg/dL (ref 2.4–7.0)

## 2013-04-04 MED ORDER — ALLOPURINOL 300 MG PO TABS: ORAL_TABLET | ORAL | Status: DC

## 2013-04-04 MED ORDER — LOSARTAN POTASSIUM 100 MG PO TABS: ORAL_TABLET | ORAL | Status: DC

## 2013-04-04 MED ORDER — SERTRALINE HCL 100 MG PO TABS: ORAL_TABLET | ORAL | Status: DC

## 2013-04-04 MED ORDER — LORAZEPAM 1 MG PO TABS: ORAL_TABLET | ORAL | Status: DC

## 2013-04-04 MED ORDER — OMEPRAZOLE 20 MG PO CPDR: DELAYED_RELEASE_CAPSULE | ORAL | Status: DC

## 2013-04-04 MED ORDER — AMLODIPINE BESYLATE 10 MG PO TABS: 10.0000 mg | ORAL_TABLET | Freq: Every day | ORAL | Status: DC

## 2013-04-04 NOTE — Progress Notes (Signed)
Subjective:    Patient ID: Lisa Larson, female    DOB: Mar 17, 1941, 72 y.o.   MRN: [id number]  HPI Medicare Wellness Visit:  Psychosocial & medical history were reviewed as required by Medicare (abuse,antisocial behavioral risks,firearm risk).  Social history: caffeine: 1 cup coffee /day  , alcohol: 28 glasses of wine/ week  ,  tobacco use: quit 1970 Exercise :  None due to knees No home & personal  safety / fall risk Activities of daily living: no limitations  Seatbelt used; NO smoke alarm employed. Power of Attorney/Living Will status : in place Ophthalmology exam current Hearing evaluation not current Orientation :oriented X 3  Memory & recall :good Math testing:fair Mood & affect : flat . Depression / anxiety: major stressors (see below) Travel history : last 1994 [place] Immunization status : Shingles needed Transfusion history:  none  Preventive health surveillance ( colonoscopies, BMD , mammograms,PAP as per protocol/ SOC):  colonoscopy ?due; mammogram appt cancelled Dental care:  Every 6 mos. Chart reviewed &  Updated. Active issues reviewed & addressed.      Review of Systems She is under a great deal of stress. Her husband they be terminally ill and is in a nursing home. Her daughter is facing heart surgery. Her son has trigeminal neuralgia and has moved in with her.     Objective:   Physical Exam Gen.: Adequately nourished in appearance. Alert, appropriate and cooperative throughout exam. Head: Normocephalic without obvious abnormalities  Eyes: No corneal or conjunctival inflammation noted.  Extraocular motion intact. Vision grossly normal with lenses Ears: External  ear exam reveals no significant lesions or deformities. Canals clear .TMs normal. Hearing is grossly slightly decreased bilaterally. Nose: External nasal exam reveals no deformity or inflammation. Nasal mucosa are pink and moist. No lesions or exudates noted.  Mouth: Oral mucosa and oropharynx  reveal no lesions or exudates. Teeth in good repair. Neck: No deformities, masses, or tenderness noted. Range of motion slightly decreased. Thyroid normal. Lungs: Normal respiratory effort; chest expands symmetrically. Lungs are clear to auscultation without rales, wheezes, or increased work of breathing. Heart: Normal rate and rhythm. Normal S1 and S2. No gallop, click, or rub. S4 w/o murmur. Abdomen: Bowel sounds normal; abdomen soft and nontender. No masses, organomegaly or hernias noted. Genitalia: S/P TAH & BSO                  Musculoskeletal/extremities: Accentuated curvature of upper thoracic  Spine. There is some asymmetry of the posterior thoracic musculature suggesting occult scoliosis. No clubbing, cyanosis,or  edema. Range of motion decreased @ L knee.Tone & strength  Normal. Joints  reveal significant DJD DIP changes. Nail health good. Able to lie down & sit up with minimal help. Negative SLR bilaterally Vascular: Carotid, radial artery, dorsalis pedis and  posterior tibial pulses are full and equal. No bruits present. Neurologic: Alert and oriented x3. Deep tendon reflexes symmetrical and normal.  Skin: Intact without suspicious lesions or rashes. Lymph: No cervical, axillary lymphadenopathy present. Psych: Mood and affect are normal. Normally interactive  Assessment & Plan:  #1 Medicare Wellness Exam; criteria met ; data entered #2 Situational depression; on Sertraline 100 mg /day #3 excess alcohol intake  Plan: see Orders

## 2013-04-04 NOTE — Patient Instructions (Addendum)
Preventive Health Care: Eat a low-fat diet with lots of fruits and vegetables, up to 7-9 servings per day. This would eliminate need for vitamin supplements for most individuals. Consume less than 30 grams of sugar per day from foods & drinks with High Fructose Corn Syrup as #2,3 or #4 on label. Smoke detectors on every level of your home, check batteries every year. Alcohol If you drink, do it moderately - 1 drink per day or less.  Please do not use Q-tips !. Should wax build up occur, please put 2-3 drops of mineral oil in the affected  ear at night to soften the wax .Cover the canal with a  cotton ball to prevent the oil from staining bed linens.In the morning fill the ear canal with hydrogen peroxide & lie in the opposite lateral decubitus position(on the side opposite the affected ear)  for 10-15 minutes.After allowing this period of time for the peroxide to dissolve the wax ;shower and use the thinnest washrag available to wick out the wax.If both ears are involved ; alternate this treatment from ear to ear each night until no wax is found on the washrag.

## 2013-04-08 LAB — VITAMIN D 1,25 DIHYDROXY
Vitamin D 1, 25 (OH)2 Total: 47 pg/mL (ref 18–72)
Vitamin D2 1, 25 (OH)2: <8 pg/mL
Vitamin D3 1, 25 (OH)2: 47 pg/mL

## 2013-04-18 ENCOUNTER — Encounter: Payer: Self-pay | Admitting: Internal Medicine

## 2013-04-24 ENCOUNTER — Other Ambulatory Visit: Payer: Self-pay | Admitting: Pulmonary Disease

## 2013-04-25 ENCOUNTER — Ambulatory Visit (INDEPENDENT_AMBULATORY_CARE_PROVIDER_SITE_OTHER)
Admission: RE | Admit: 2013-04-25 | Discharge: 2013-04-25 | Disposition: A | Discharge status: Home / Self Care | Payer: Medicare Other | Source: Ambulatory Visit | Attending: Pulmonary Disease | Admitting: Pulmonary Disease

## 2013-04-25 ENCOUNTER — Encounter: Payer: Self-pay | Admitting: Pulmonary Disease

## 2013-04-25 ENCOUNTER — Other Ambulatory Visit (INDEPENDENT_AMBULATORY_CARE_PROVIDER_SITE_OTHER): Payer: Medicare Other

## 2013-04-25 ENCOUNTER — Ambulatory Visit (INDEPENDENT_AMBULATORY_CARE_PROVIDER_SITE_OTHER): Payer: Medicare Other | Admitting: Pulmonary Disease

## 2013-04-25 VITALS — BP 110/68 | HR 77 | Temp 97.7°F | Ht 61.0 in | Wt 168.0 lb

## 2013-04-25 DIAGNOSIS — J8281 Chronic eosinophilic pneumonia: Secondary | ICD-10-CM

## 2013-04-25 DIAGNOSIS — J8289 Other pulmonary eosinophilia, not elsewhere classified: Secondary | ICD-10-CM | POA: Diagnosis not present

## 2013-04-25 DIAGNOSIS — J4540 Moderate persistent asthma, uncomplicated: Secondary | ICD-10-CM

## 2013-04-25 DIAGNOSIS — R7309 Other abnormal glucose: Secondary | ICD-10-CM

## 2013-04-25 DIAGNOSIS — R739 Hyperglycemia, unspecified: Secondary | ICD-10-CM

## 2013-04-25 DIAGNOSIS — J984 Other disorders of lung: Secondary | ICD-10-CM | POA: Diagnosis not present

## 2013-04-25 DIAGNOSIS — J309 Allergic rhinitis, unspecified: Secondary | ICD-10-CM | POA: Diagnosis not present

## 2013-04-25 DIAGNOSIS — J45909 Unspecified asthma, uncomplicated: Secondary | ICD-10-CM

## 2013-04-25 LAB — CBC WITH DIFFERENTIAL/PLATELET
Basophils Absolute: 0 10*3/uL (ref 0.0–0.1)
Basophils Relative: 0.5 % (ref 0.0–3.0)
Eosinophils Absolute: 0.8 10*3/uL — ABNORMAL HIGH (ref 0.0–0.7)
Eosinophils Relative: 13.7 % — ABNORMAL HIGH (ref 0.0–5.0)
HCT: 41.6 % (ref 36.0–46.0)
Hemoglobin: 14.2 g/dL (ref 12.0–15.0)
Lymphocytes Relative: 17.4 % (ref 12.0–46.0)
Lymphs Abs: 1 10*3/uL (ref 0.7–4.0)
MCHC: 34 g/dL (ref 30.0–36.0)
MCV: 96.8 fl (ref 78.0–100.0)
Monocytes Absolute: 0.6 10*3/uL (ref 0.1–1.0)
Monocytes Relative: 11 % (ref 3.0–12.0)
Neutro Abs: 3.2 10*3/uL (ref 1.4–7.7)
Neutrophils Relative %: 57.4 % (ref 43.0–77.0)
Platelets: 152 10*3/uL (ref 150.0–400.0)
RBC: 4.3 Mil/uL (ref 3.87–5.11)
RDW: 14.9 % — ABNORMAL HIGH (ref 11.5–14.6)
WBC: 5.6 10*3/uL (ref 4.5–10.5)

## 2013-04-25 LAB — HEMOGLOBIN A1C: Hgb A1c MFr Bld: 5.2 % (ref 4.6–6.5)

## 2013-04-25 NOTE — Progress Notes (Signed)
Chief Complaint  Patient presents with  . Follow-up    Pt reports she has been wheezing some. Pt reports she uses her rescue inhaler 2-3 times per week. She thinks her breathing is doing okay. she had a loss in the family so not sure if that is affecting everything.     History of Present Illness: Lisa Larson is a 72 y.o. 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  Her husband passed away two weeks ago.  She has been very distraught.  In fact, she didn't realize today would have been their wedding anniversary.  She is having trouble recently keeping up with her own health needs.  She has not been taking her medications on a consistent basis.  She has noticed her breathing has gotten worse as a result.  She has been trying to stay strong for her children.  She has noticed more cough and wheeze.  She denies sputum, fever, chest pain, or skin rash.  She has noticed some ankle swelling when she stands too long.  Tests: CBC 01/14/11>>39% eosinophils 01/14/11>> HIV negative, ACE 39, RF negative, ANA 1:40, ANCA negative  CT chest 01/16/11>>Multifocal b/l ASD with GGO BAL 01/21/11>>48% Eosinophils  IgE 02/25/11>>282 CT chest 07/22/11>>resolution of ASD CT chest 10/03/11>>recurrence of nodular infiltrate Rt upper lobe 10/08/11>>resume prednisone 10/08/11 CXR 08/19/12>>no acute findings PFT 11/04/12>>FEV1 1.77 (87%), FEV1% 68, TLC 4.87 (101%), DLCO 69%, +BD from FEF 25-75%. 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  She  has past surgical history that includes Shoulder surgery (08-2008); Total abdominal hysterectomy; Tonsillectomy and adenoidectomy; Colonoscopy; Nasal sinus surgery; Appendectomy; Esophageal dilation; Bronchoscopy (12-2010); and Cataract extraction  w/ intraocular lens  implant, bilateral.   Current Outpatient Prescriptions on File Prior to Visit  Medication Sig Dispense Refill  . albuterol (PROAIR HFA) 108 (90 BASE) MCG/ACT inhaler Inhale 2 puffs into the lungs every 6 (six) hours as needed.  18 g  11  . allopurinol (ZYLOPRIM) 300 MG tablet TAKE 1/2 TABLET BY MOUTH EVERY DAY  45 tablet  3  . amLODipine (NORVASC) 10 MG tablet Take 1 tablet (10 mg total) by mouth daily.  90 tablet  3  . Cetirizine HCl 10 MG CAPS Take 10 mg by mouth daily.      Marland Kitchen LORazepam (ATIVAN) 1 MG tablet TAKE 1 TABLET BY MOUTH EVERY 8 HOURS AS NEEDED Do not take within 8-12 hrs of alcohol  30 tablet  0  . losartan (COZAAR) 100 MG tablet TAKE 1 TABLET BY MOUTH EVERY DAY  90 tablet  3  . mometasone (NASONEX) 50 MCG/ACT nasal spray Place 2 sprays into the nose daily as needed.  17 g  11  . mometasone-formoterol (DULERA) 100-5 MCG/ACT AERO Inhale 2 puffs into the lungs 2 (two) times daily.  1 Inhaler  5  . omeprazole (PRILOSEC) 20 MG capsule TAKE ONE CAPSULE BY MOUTH EVERY DAY  90 capsule  3  . ondansetron (ZOFRAN) 4 MG tablet Take 1 tablet (4 mg total) by mouth every 8 (eight) hours as needed for nausea.  20 tablet  0  . predniSONE (DELTASONE) 2.5 MG tablet Take 1 tablet (2.5 mg total) by mouth every other day.      . sertraline (ZOLOFT) 100 MG tablet TAKE 1 TABLET BY MOUTH EVERY DAY  90 tablet  3  .  traMADol (ULTRAM) 50 MG tablet 1/2-1 by mouth every 6 hours as needed  60 tablet  1   No current facility-administered medications on file prior to visit.    Allergies  Allergen Reactions  . Sulfonamide Derivatives     Rash; dyspnea  . Benazepril Hcl     No PMH of angioedema; ACE-I caused cough  . Diclofenac   . Oxycodone-Aspirin   . Rofecoxib     Physical Exam:  General - obese HEENT - no sinus tenderness, no oral exudate, no LAN Chest - no wheeze/rales/dullness Cardiac - s1s2 regular, no murmur Abd - soft, nontender Ext - no edema Neuro - normal  strength Skin - no rash Psych - depressed    CBC    Component Value Date/Time   WBC 5.9 02/21/2013 1036   RBC 3.99 02/21/2013 1036   HGB 12.7 02/21/2013 1036   HCT 37.4 02/21/2013 1036   PLT 185.0 02/21/2013 1036   MCV 93.6 02/21/2013 1036   MCHC 33.9 02/21/2013 1036   RDW 13.4 02/21/2013 1036   LYMPHSABS 1.2 02/21/2013 1036   MONOABS 0.6 02/21/2013 1036   EOSABS 0.5 02/21/2013 1036   BASOSABS 0.0 02/21/2013 1036      Assessment/Plan:  [name], MD [facility] Pulmonary/Critical Care 04/25/2013, 10:27 AM Pager:  [phone] After 3pm call: [phone]

## 2013-04-25 NOTE — Patient Instructions (Signed)
Continue prednisone 2.5 mg every other day Lab work and chest xray today Follow up in 4 months

## 2013-04-26 ENCOUNTER — Telehealth: Payer: Self-pay | Admitting: Pulmonary Disease

## 2013-04-26 NOTE — Telephone Encounter (Signed)
Dg Chest 2 View  04/25/2013  *RADIOLOGY REPORT*  Clinical Data: Eosinophilic pneumonia.  Wheezing.  CHEST - 2 VIEW  Comparison: 02/21/2013  Findings: Increasing rounded opacity in the right lung base concerning for pneumonia.  Stable linear densities in both lung bases compatible with scarring.  Heart is borderline in size.  No effusions.  Mediastinal contours within normal limits.  No acute bony abnormality.  IMPRESSION: Bibasilar scarring.  Increasing rounded opacity the right lung base concerning for pneumonia.  Recommend follow up after treatment.   Original Report Authenticated By: Charlett Nose, M.D.     CBC    Component Value Date/Time   WBC 5.6 04/25/2013 1226   RBC 4.30 04/25/2013 1226   HGB 14.2 04/25/2013 1226   HCT 41.6 04/25/2013 1226   PLT 152.0 04/25/2013 1226   MCV 96.8 04/25/2013 1226   MCHC 34.0 04/25/2013 1226   RDW 14.9* 04/25/2013 1226   LYMPHSABS 1.0 04/25/2013 1226   MONOABS 0.6 04/25/2013 1226   EOSABS 0.8* 04/25/2013 1226   BASOSABS 0.0 04/25/2013 1226    Lab Results  Component Value Date   HGBA1C 5.2 04/25/2013    Attempted to call pt to discuss results.  She likely has progression of eosinophilic pneumonia due to inconsistent use of medications since her husband recently passed away.  Will have my nurse call to inform patient that CXR shows increase in pneumonia.  Will have her change her prednisone to 10 mg daily until next office visit.  She will need repeat chest xray and CBC with differential at next office visit.  She will need to have ROV schedule in 3 to 4 weeks instead of 4 months as was previously planned at last last ROV.

## 2013-04-27 ENCOUNTER — Telehealth: Payer: Self-pay | Admitting: Pulmonary Disease

## 2013-04-27 MED ORDER — PREDNISONE 10 MG PO TABS: 10.0000 mg | ORAL_TABLET | Freq: Every day | ORAL | Status: DC

## 2013-04-27 NOTE — Telephone Encounter (Signed)
See phone note 04/26/13

## 2013-04-27 NOTE — Telephone Encounter (Signed)
Please explain to pt that she likely has worsening of her eosinophilic pneumonia and needs higher dose of prednisone.  Her exam and lab tests weren't consistent with a bacterial pneumonia, and therefore does not need antibiotics at this time.

## 2013-04-27 NOTE — Assessment & Plan Note (Signed)
She is to continue nasonex and prn cetirizine.

## 2013-04-27 NOTE — Telephone Encounter (Signed)
Spoke with pt and notified of results per Dr. Craige Cotta. Pt verbalized understanding and denied any questions. She is scheduled for rov with VS for 05/25/13 at 11 am  Rx for 10 mg prednisone was sent to pharm  Nothing further needed

## 2013-04-27 NOTE — Telephone Encounter (Signed)
See 4/30 phone note

## 2013-04-27 NOTE — Assessment & Plan Note (Addendum)
Advised her to resume taking prednisone at 2.5 mg every other day for now.  Will repeat her chest xray and CBC with differential.  Depending on results will determine what adjustments to make to her prednisone regimen.  She is also concerned whether using prednisone can predispose her to develop diabetes.  Will check HbA1C.

## 2013-04-27 NOTE — Telephone Encounter (Signed)
I spoke with pt and explained to her VS response. She voiced her understanding and needed nothing further.

## 2013-04-27 NOTE — Assessment & Plan Note (Signed)
Will have her resume Dulera.  I have given a sample of this.  She can continue proair as needed.

## 2013-04-27 NOTE — Telephone Encounter (Signed)
I spoke with pt and she stated her husband had died from bacterial PNA. She is wanting to know if that could effect her in any way and if this would show up in her CXR (meaning bacterial PNA). Pt is just concerned bc her husband passed away from this. Please advise Dr. Craige Cotta thanks

## 2013-04-27 NOTE — Telephone Encounter (Signed)
lmomtcb x1 

## 2013-04-29 ENCOUNTER — Telehealth: Payer: Self-pay | Admitting: Pulmonary Disease

## 2013-04-29 NOTE — Telephone Encounter (Signed)
i advised pt of level. Nothing further was needed.

## 2013-05-20 ENCOUNTER — Telehealth: Payer: Self-pay | Admitting: Internal Medicine

## 2013-05-20 ENCOUNTER — Other Ambulatory Visit: Payer: Self-pay | Admitting: Internal Medicine

## 2013-05-20 NOTE — Telephone Encounter (Signed)
Per Dr.Hopper patient to be seen for she has not been seen recently for gas. Spoke with patient, patient scheduled appointment for next Thursday at 10:00 am

## 2013-05-20 NOTE — Telephone Encounter (Signed)
Patient is calling to request a referral to see a GI doctor. States that she has been having issues with severe gas. Patient has not had a colonoscopy in 10 years and is due for a screening. Please advise if patient needs an appointment.

## 2013-05-25 ENCOUNTER — Ambulatory Visit (INDEPENDENT_AMBULATORY_CARE_PROVIDER_SITE_OTHER): Payer: Medicare Other | Admitting: Pulmonary Disease

## 2013-05-25 ENCOUNTER — Ambulatory Visit (INDEPENDENT_AMBULATORY_CARE_PROVIDER_SITE_OTHER)
Admission: RE | Admit: 2013-05-25 | Discharge: 2013-05-25 | Disposition: A | Discharge status: Home / Self Care | Payer: Medicare Other | Source: Ambulatory Visit | Attending: Pulmonary Disease | Admitting: Pulmonary Disease

## 2013-05-25 ENCOUNTER — Encounter: Payer: Self-pay | Admitting: Pulmonary Disease

## 2013-05-25 ENCOUNTER — Other Ambulatory Visit (INDEPENDENT_AMBULATORY_CARE_PROVIDER_SITE_OTHER): Payer: Medicare Other

## 2013-05-25 VITALS — BP 138/94 | HR 89 | Temp 98.3°F | Ht 61.0 in | Wt 168.0 lb

## 2013-05-25 DIAGNOSIS — J984 Other disorders of lung: Secondary | ICD-10-CM | POA: Diagnosis not present

## 2013-05-25 DIAGNOSIS — J8281 Chronic eosinophilic pneumonia: Secondary | ICD-10-CM

## 2013-05-25 DIAGNOSIS — R222 Localized swelling, mass and lump, trunk: Secondary | ICD-10-CM | POA: Insufficient documentation

## 2013-05-25 DIAGNOSIS — J309 Allergic rhinitis, unspecified: Secondary | ICD-10-CM

## 2013-05-25 DIAGNOSIS — R110 Nausea: Secondary | ICD-10-CM

## 2013-05-25 DIAGNOSIS — J8289 Other pulmonary eosinophilia, not elsewhere classified: Secondary | ICD-10-CM

## 2013-05-25 LAB — CBC WITH DIFFERENTIAL/PLATELET
Basophils Absolute: 0 10*3/uL (ref 0.0–0.1)
Basophils Relative: 0.2 % (ref 0.0–3.0)
Eosinophils Absolute: 0.2 10*3/uL (ref 0.0–0.7)
Eosinophils Relative: 2.2 % (ref 0.0–5.0)
HCT: 40.7 % (ref 36.0–46.0)
Hemoglobin: 13.9 g/dL (ref 12.0–15.0)
Lymphocytes Relative: 11.7 % — ABNORMAL LOW (ref 12.0–46.0)
Lymphs Abs: 0.8 10*3/uL (ref 0.7–4.0)
MCHC: 34.2 g/dL (ref 30.0–36.0)
MCV: 94.6 fl (ref 78.0–100.0)
Monocytes Absolute: 0.3 10*3/uL (ref 0.1–1.0)
Monocytes Relative: 4.2 % (ref 3.0–12.0)
Neutro Abs: 5.8 10*3/uL (ref 1.4–7.7)
Neutrophils Relative %: 81.7 % — ABNORMAL HIGH (ref 43.0–77.0)
Platelets: 153 10*3/uL (ref 150.0–400.0)
RBC: 4.3 Mil/uL (ref 3.87–5.11)
RDW: 14.3 % (ref 11.5–14.6)
WBC: 7.1 10*3/uL (ref 4.5–10.5)

## 2013-05-25 MED ORDER — ONDANSETRON HCL 4 MG PO TABS: 4.0000 mg | ORAL_TABLET | Freq: Three times a day (TID) | ORAL | Status: DC | PRN

## 2013-05-25 MED ORDER — PREDNISONE 10 MG PO TABS: 10.0000 mg | ORAL_TABLET | Freq: Every day | ORAL | Status: DC

## 2013-05-25 NOTE — Progress Notes (Signed)
Chief Complaint  Patient presents with  . Follow-up    Breathing is unchanged. Reports having a "knot" on her upper chest and coughing. Denies chest pain or tightness, SOB.    History of Present Illness: Lisa Larson is a 72 y.o. 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  Her breathing has been doing better.  She is not having cough, sputum, chest congestion, wheeze, sinus congestion, or fever.  She is using 10 mg prednisone daily.  She has not needed to use nasonex or albuterol much.  She has noted swelling in her chest over her sternum.  This is not tender or itchy.  Tests: CBC 01/14/11>>39% eosinophils 01/14/11>> HIV negative, ACE 39, RF negative, ANA 1:40, ANCA negative  CT chest 01/16/11>>Multifocal b/l ASD with GGO BAL 01/21/11>>48% Eosinophils  IgE 02/25/11>>282 CT chest 07/22/11>>resolution of ASD CT chest 10/03/11>>recurrence of nodular infiltrate Rt upper lobe 10/08/11>>resume prednisone 10/08/11 CXR 08/19/12>>no acute findings PFT 11/04/12>>FEV1 1.77 (87%), FEV1% 68, TLC 4.87 (101%), DLCO 69%, +BD from FEF 25-75%. 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  She  has past surgical history that includes Shoulder surgery (08-2008); Total abdominal hysterectomy; Tonsillectomy and adenoidectomy; Colonoscopy; Nasal sinus surgery; Appendectomy; Esophageal dilation; Bronchoscopy (12-2010); and Cataract extraction w/ intraocular lens  implant, bilateral.   Current Outpatient Prescriptions on File Prior to Visit  Medication Sig Dispense Refill  . albuterol (PROAIR HFA) 108 (90 BASE) MCG/ACT inhaler Inhale 2 puffs into the lungs every 6 (six) hours as needed.  18 g  11  . allopurinol (ZYLOPRIM) 300 MG tablet TAKE 1/2 TABLET BY MOUTH EVERY DAY  45  tablet  3  . amLODipine (NORVASC) 10 MG tablet Take 1 tablet (10 mg total) by mouth daily.  90 tablet  3  . Cetirizine HCl 10 MG CAPS Take 10 mg by mouth daily.      Marland Kitchen LORazepam (ATIVAN) 1 MG tablet TAKE 1 TABLET BY MOUTH EVERY 8 HOURS AS NEEDED Do not take within 8-12 hrs of alcohol  30 tablet  0  . losartan (COZAAR) 100 MG tablet TAKE 1 TABLET BY MOUTH EVERY DAY  90 tablet  3  . mometasone (NASONEX) 50 MCG/ACT nasal spray Place 2 sprays into the nose daily as needed.  17 g  11  . mometasone-formoterol (DULERA) 100-5 MCG/ACT AERO Inhale 2 puffs into the lungs 2 (two) times daily.  1 Inhaler  5  . omeprazole (PRILOSEC) 20 MG capsule TAKE ONE CAPSULE BY MOUTH EVERY DAY  90 capsule  3  . ondansetron (ZOFRAN) 4 MG tablet Take 1 tablet (4 mg total) by mouth every 8 (eight) hours as needed for nausea.  20 tablet  0  . predniSONE (DELTASONE) 10 MG tablet Take 1 tablet (10 mg total) by mouth daily.  30 tablet  0  . sertraline (ZOLOFT) 100 MG tablet TAKE 1 TABLET BY MOUTH EVERY DAY  90 tablet  3  . traMADol (ULTRAM) 50 MG tablet 1/2-1 by mouth every 6 hours as needed  60 tablet  1   No current facility-administered medications on file prior to visit.    Allergies  Allergen Reactions  . Sulfonamide Derivatives     Rash; dyspnea  . Benazepril Hcl     No PMH of angioedema; ACE-I caused cough  . Diclofenac   . Oxycodone-Aspirin   .  Rofecoxib     Physical Exam:  General - obese HEENT - no sinus tenderness, no oral exudate, no LAN Chest - no wheeze/rales/dullness. Soft area over sternum non tender and w/o warmth. Cardiac - s1s2 regular, no murmur Abd - soft, nontender Ext - no edema Neuro - normal strength Skin - no rash Psych - normal mood, behavior    CBC    Component Value Date/Time   WBC 7.1 05/25/2013 1150   RBC 4.30 05/25/2013 1150   HGB 13.9 05/25/2013 1150   HCT 40.7 05/25/2013 1150   PLT 153.0 05/25/2013 1150   MCV 94.6 05/25/2013 1150   MCHC 34.2 05/25/2013 1150   RDW 14.3  05/25/2013 1150   LYMPHSABS 0.8 05/25/2013 1150   MONOABS 0.3 05/25/2013 1150   EOSABS 0.2 05/25/2013 1150   BASOSABS 0.0 05/25/2013 1150   Dg Chest 2 View  04/25/2013   *RADIOLOGY REPORT*  Clinical Data: Eosinophilic pneumonia.  Wheezing.  CHEST - 2 VIEW  Comparison: 02/21/2013  Findings: Increasing rounded opacity in the right lung base concerning for pneumonia.  Stable linear densities in both lung bases compatible with scarring.  Heart is borderline in size.  No effusions.  Mediastinal contours within normal limits.  No acute bony abnormality.  IMPRESSION: Bibasilar scarring.  Increasing rounded opacity the right lung base concerning for pneumonia.  Recommend follow up after treatment.   Original Report Authenticated By: Charlett Nose, M.D.   Dg Chest 2 View  05/25/2013   *RADIOLOGY REPORT*  Clinical Data: Cough, chest pain, former smoking history  CHEST - 2 VIEW  Comparison: Chest x-ray of 04/25/2013  Findings: The vague opacity at the right lung base noted previously has cleared most likely having represented pneumonia which has resolved. Basilar scarring appears stable primarily within the right middle lobe.  No effusion is seen.  Mediastinal contours are stable.  The heart is mildly enlarged and stable.  No acute bony abnormality is seen with degenerative changes present in the lower thoracic spine.  IMPRESSION:  1.  Clearing of right basilar opacity most consistent with resolution of pneumonia. 2.  Little change in primarily right middle lobe linear scarring.   Original Report Authenticated By: Dwyane Dee, M.D.      Assessment/Plan:  Lisa Helling, MD [facility] Pulmonary/Critical Care 05/25/2013, 12:12 PM Pager:  [phone] After 3pm call: [phone]

## 2013-05-25 NOTE — Patient Instructions (Signed)
Continue prednisone 10 mg daily Follow up in 6 weeks with chest xray

## 2013-05-25 NOTE — Assessment & Plan Note (Signed)
CXR and labs better since increase in prednisone.  Will continue prednisone 10 mg daily, and repeat CXR at follow up in 6 weeks.

## 2013-05-25 NOTE — Assessment & Plan Note (Signed)
Continue dulera and prn proair.

## 2013-05-25 NOTE — Assessment & Plan Note (Signed)
She notes lump in her mid chest.  This feels like a lipoma.  She will d/w her PCP.

## 2013-05-25 NOTE — Assessment & Plan Note (Signed)
Refilled zofran

## 2013-05-25 NOTE — Assessment & Plan Note (Signed)
Stable on prn nasonex.

## 2013-05-26 ENCOUNTER — Telehealth: Payer: Self-pay | Admitting: Internal Medicine

## 2013-05-26 ENCOUNTER — Encounter: Payer: Self-pay | Admitting: Internal Medicine

## 2013-05-26 ENCOUNTER — Ambulatory Visit (INDEPENDENT_AMBULATORY_CARE_PROVIDER_SITE_OTHER): Payer: Medicare Other | Admitting: Internal Medicine

## 2013-05-26 VITALS — BP 124/80 | HR 77 | Temp 98.2°F | Wt 168.0 lb

## 2013-05-26 DIAGNOSIS — D179 Benign lipomatous neoplasm, unspecified: Secondary | ICD-10-CM | POA: Diagnosis not present

## 2013-05-26 DIAGNOSIS — K219 Gastro-esophageal reflux disease without esophagitis: Secondary | ICD-10-CM | POA: Diagnosis not present

## 2013-05-26 DIAGNOSIS — K6289 Other specified diseases of anus and rectum: Secondary | ICD-10-CM | POA: Diagnosis not present

## 2013-05-26 DIAGNOSIS — R0789 Other chest pain: Secondary | ICD-10-CM

## 2013-05-26 NOTE — Patient Instructions (Addendum)
Reflux of gastric acid may be asymptomatic as this may occur mainly during sleep.The triggers for reflux  include stress; the "aspirin family" ; alcohol; peppermint; and caffeine (coffee, tea, cola, and chocolate). The aspirin family would include aspirin and the nonsteroidal agents such as ibuprofen &  Naproxen. Tylenol would not cause reflux. If having symptoms ; food & drink should be avoided for @ least 2 hours before going to bed.   Please take Nexium 40 mg samples in place of the Prilosec until you're seen by gastroenterology.

## 2013-05-26 NOTE — Progress Notes (Signed)
Subjective:    Patient ID: Lisa Larson, female    DOB: 19-Jan-1941, 72 y.o.   MRN: [id number]  HPI  She has several concerns. Over the last 6 weeks she's had rectal discomfort up to a level II which has been stable. This is reminiscent of the pain she had hemorrhoids following delivery of her child. She's been using cleansing wipes which helped minimally.  She's also concerned about a "lump "over the mid chest which is been present for 4 days, noted by her son. It is nontender and does not hurt. There is no associated change in color or temperature. There was no specific trigger or injury prior to appearance of the lesion.  Almost after the fact she mentions chest pain which occurs at night and awakens her. It will last several hours. She is unable to quantitate the discomfort but states it is up to a level IV. This is been present for approximately 10 days, again with no specific trigger or injury. It is not exertional and not associated with palpitations, dyspnea,PND, or hemoptysis.No associated dyspepsia ; she has pill dysphagia if not capsules. She takes maintenance each am. PMH of esophageal stricture; S/P X 1. Last colonoscopy 11 years ago.   Review of Systems  There was no associated rash or radicular pain in the area of the discomfort. Constitutional: No fever, chills, sweats, unexplained weight loss HEENT: Reports mild hearing loss and wax in ears.  No diplopia, blurred vision, or loss of vision, or tinnitus Cardiopulmonary: See HPI, occasional dry cough present.   GI: No abdominal pain; unexplained weight loss; melena; rectal bleeding GU: No hematuria, pyuria, or dysuria MS: Reports stiff knees. No reddness Heme/Lymph Reports bruising more easily with age. No abnormal bleeding Derm: No change in color or temperature of skin; no rash in area of pain Neuro: Tremor in hands since taking prednisone.  Occasional urine or stool incontinence (12 episodes in last year) due to urgency  and not being close enough to a bathroom.  No limb weakness; radicular pain. Psych: In addition to her husband's death in 2024/04/18Coumadin 2 other family members have died recently. She feels that the stress is part of her problem. She does not attend church but is spiritual.         Objective:   Physical Exam General appearance is one of adequate nourishment.No distress.  Eyes: No conjunctival inflammation or scleral icterus is present.  Oral exam: Dental hygiene is good; lips and gums are healthy appearing.There is no oropharyngeal erythema or exudate noted.   Heart:  Normal rate and regular rhythm. S1 and S2 normal without gallop, murmur, click, rub or other extra sounds    Chest: Mild pectus carinatum with superimposed lipoma which is not fixed to subcutaneous tissues and which transilluminates  Lungs:Chest clear to auscultation; minor rales  present.No increased work of breathing.   Abdomen: bowel sounds normal, soft and non-tender without masses, organomegaly or hernias noted.  No guarding or rebound   Skin:Warm & dry.  Intact without suspicious lesions or rashes ; no jaundice ; slight  tenting  Lymphatic: No lymphadenopathy is noted about the head, neck, axilla   Rectal exam negative for masses; Hemoccult card negative              Assessment & Plan:  #1 rectal pain; negative rectal exam and Hemoccult. Her colonoscopy is overdue. GI consultation appropriate in view of her concerns.  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 She'll be given a trial of Nexium in place of the Prilosec. Again GI consultation to address this problem as well.  #4 exogenous stress due to death of family members. I have recommended that she consult with hospice for support services. Hospice did work with her husband prior to his death

## 2013-05-26 NOTE — Telephone Encounter (Signed)
Spoke with patient and told her our policy for changing physicians. She said she will call back when she is not upset to discuss. Patient crying and states just forget and call to let Dr. Alwyn Ren know. Called and spoke with Dr. Frederik Pear office. They will let Dr. Alwyn Ren know.

## 2013-05-26 NOTE — Telephone Encounter (Signed)
Spoke with Rene Kocher and was notified of the situation. Will wait to see what the pt decides when she calls back.

## 2013-05-27 ENCOUNTER — Telehealth: Payer: Self-pay | Admitting: Internal Medicine

## 2013-05-27 NOTE — Telephone Encounter (Signed)
Spoke with Pt advise that I have discuss this issue with my [facility] and she is to followup with this issue and let Pt know the outcome.

## 2013-05-27 NOTE — Telephone Encounter (Signed)
Patient would like to switch from Dr. Juanda Chance to Dr. Christella Hartigan. Lisa Larson wants to have the same doctor as her daughter. Is this ok with you Dr. Juanda Chance?

## 2013-05-27 NOTE — Telephone Encounter (Signed)
This Dept of GI policy , not mine & I have no say

## 2013-05-27 NOTE — Telephone Encounter (Signed)
Patient called requesting to speak with Chrae regarding her GI doctor. She would like to switch to Dr. Christella Hartigan because of a family member's recommendation. She states she does not understand why she cannot switch doctors if they are in the same practice.

## 2013-05-27 NOTE — Telephone Encounter (Signed)
I called patient and again explained to her as I did on 05/26/13, that it is the decision of the specialty office she is a patient of, to allow her to change providers within the same practice.  I also explained to patient that this is the case with most if not all specialty offices, and even some primary care practices.  Patient has already been contacted by [facility], and was told their policy on switching providers.  Patient is upset, does not understand why she cannot see who she wants because this is a free world.  Patient states she plans to call [facility] back again to discuss how bad she wants to see Dr. Christella Hartigan.

## 2013-05-27 NOTE — Telephone Encounter (Signed)
OK to switch 

## 2013-05-30 NOTE — Telephone Encounter (Signed)
lmom for pt to call back

## 2013-05-30 NOTE — Telephone Encounter (Signed)
Dr Christella Hartigan, will you accept Ms Ahart as a NP; she states her daughter sees you? Thanks.

## 2013-05-30 NOTE — Telephone Encounter (Signed)
That is good with me, thanks

## 2013-05-31 NOTE — Telephone Encounter (Signed)
Spoke with patient and she would like to schedule OV with Dr. Christella Hartigan. Scheduled patient on 06/03/13 at 10:00 AM with Dr. Christella Hartigan.

## 2013-06-03 ENCOUNTER — Ambulatory Visit (INDEPENDENT_AMBULATORY_CARE_PROVIDER_SITE_OTHER): Payer: Medicare Other | Admitting: Gastroenterology

## 2013-06-03 ENCOUNTER — Encounter: Payer: Self-pay | Admitting: Gastroenterology

## 2013-06-03 VITALS — BP 116/72 | HR 80 | Wt 171.1 lb

## 2013-06-03 DIAGNOSIS — K5900 Constipation, unspecified: Secondary | ICD-10-CM | POA: Diagnosis not present

## 2013-06-03 DIAGNOSIS — K6289 Other specified diseases of anus and rectum: Secondary | ICD-10-CM

## 2013-06-03 MED ORDER — MOVIPREP 100 G PO SOLR: 1.0000 | Freq: Once | ORAL | Status: DC

## 2013-06-03 NOTE — Patient Instructions (Addendum)
Please start taking citrucel (orange flavored) powder fiber supplement.  This may cause some bloating at first but that usually goes away. Begin with a small spoonful and work your way up to a large, heaping spoonful daily over a week. You will be set up for a colonoscopy for constipation, rectal discomfort (LEC, moderate sedation)                                               We are excited to introduce MyChart, a new best-in-class service that provides you online access to important information in your electronic medical record. We want to make it easier for you to view your health information - all in one secure location - when and where you need it. We expect MyChart will enhance the quality of care and service we provide.  When you register for MyChart, you can:    View your test results.    Request appointments and receive appointment reminders via email.    Request medication renewals.    View your medical history, allergies, medications and immunizations.    Communicate with your physician's office through a password-protected site.    Conveniently print information such as your medication lists.  To find out if MyChart is right for you, please talk to a member of our clinical staff today. We will gladly answer your questions about this free health and wellness tool.  If you are age 70 or older and want a member of your family to have access to your record, you must provide written consent by completing a proxy form available at our office. Please speak to our clinical staff about guidelines regarding accounts for patients younger than age 16.  As you activate your MyChart account and need any technical assistance, please call the MyChart technical support line at [phone]) or email your question to [email or web address]. If you email your question(s), please include your name, a return phone number and the best time to reach you.  If you have non-urgent  health-related questions, you can send a message to our office through MyChart at [email or web address]. If you have a medical emergency, call 911.  Thank you for using MyChart as your new health and wellness resource!   MyChart licensed from [employer],  3474-2595. Patents Pending.

## 2013-06-03 NOTE — Progress Notes (Signed)
Review of pertinent gastrointestinal problems: 1. EGD Dr. Juanda Chance 2008; "acute gastritis" RUT biopsy taken, I cannot find result today.  Empiric maloney dilator passed 48Fr.   HPI: This is a  very pleasant 72 year old woman whom I am meeting for the first time today.  She requested that I become her primary GI.  In past month or so, she's had rectal pain.  No bleeding.  She has had some constipation recently.  But mostly has BM every day.  Has had 3 deaths recently (husband died, nephew killed himself, husbands sister died) all in the past month.  Had colonoscopy 11 years ago, recommended 10 year recall.  She has been on antibiotics recently for possible tooth infection  Review of systems: Pertinent positive and negative review of systems were noted in the above HPI section. Complete review of systems was performed and was otherwise normal.    Past Medical History  Diagnosis Date  . 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 Asthma   . Hypertension   . Osteopenia     BMD T score-1.6 at L femoral neck 11-27-2009, s/p fosamax x 5 years  . GERD (gastroesophageal reflux disease)   . Depression   . Hyperlipidemia   . Anxiety   . Baker's cyst, ruptured 2012    right  . Arthritis   . Chronic headaches   . COPD (chronic obstructive pulmonary disease)   . Status post dilation of esophageal narrowing   . Lipoma     Past Surgical History  Procedure Laterality Date  . Shoulder surgery Left 08-2008    fracture repair, Dr. Gean Birchwood  . Total abdominal hysterectomy    . Tonsillectomy and adenoidectomy    . Colonoscopy      negative  . Nasal sinus surgery    . Appendectomy    . Esophageal dilation      Dr Juanda Chance  . Bronchoscopy  12-2010    Dr. Craige Cotta  . Cataract extraction w/ intraocular lens  implant, bilateral Bilateral     Current Outpatient Prescriptions  Medication Sig Dispense Refill  . albuterol (PROAIR HFA) 108 (90 BASE) MCG/ACT inhaler Inhale 2 puffs into the lungs  every 6 (six) hours as needed.  18 g  11  . allopurinol (ZYLOPRIM) 300 MG tablet TAKE 1/2 TABLET BY MOUTH EVERY DAY  45 tablet  3  . amLODipine (NORVASC) 10 MG tablet Take 1 tablet (10 mg total) by mouth daily.  90 tablet  3  . Cetirizine HCl 10 MG CAPS Take 10 mg by mouth daily.      Marland Kitchen esomeprazole (NEXIUM) 40 MG capsule Take 40 mg by mouth daily before breakfast.      . LORazepam (ATIVAN) 1 MG tablet TAKE 1 TABLET BY MOUTH EVERY 8 HOURS AS NEEDED Do not take within 8-12 hrs of alcohol  30 tablet  0  . losartan (COZAAR) 100 MG tablet TAKE 1 TABLET BY MOUTH EVERY DAY  90 tablet  3  . mometasone (NASONEX) 50 MCG/ACT nasal spray Place 2 sprays into the nose daily as needed.  17 g  11  . mometasone-formoterol (DULERA) 100-5 MCG/ACT AERO Inhale 2 puffs into the lungs 2 (two) times daily.  1 Inhaler  5  . ondansetron (ZOFRAN) 4 MG tablet Take 1 tablet (4 mg total) by mouth every 8 (eight) hours as needed for nausea.  20 tablet  1  . predniSONE (DELTASONE) 10 MG tablet Take 1 tablet (10 mg total) by mouth daily.  30 tablet  2  . sertraline (ZOLOFT) 100 MG tablet TAKE 1 TABLET BY MOUTH EVERY DAY  90 tablet  3  . thiamine (VITAMIN B-1) 100 MG tablet Take 100 mg by mouth daily.      . traMADol (ULTRAM) 50 MG tablet 1/2-1 by mouth every 6 hours as needed  60 tablet  1   No current facility-administered medications for this visit.    Allergies as of 06/03/2013 - Review Complete 06/03/2013  Allergen Reaction Noted  . Sulfonamide derivatives  06/09/2007  . Benazepril hcl  06/09/2007  . Diclofenac  10/07/2011  . Oxycodone-aspirin  06/09/2007  . Rofecoxib  06/09/2007    Family History  Problem Relation Age of Onset  . Arthritis Father   . Rheum arthritis Father   . Hypertension Father   . Hypertension Mother   . Alzheimer's disease Mother   . Hypertension Brother   . Diabetes Brother   . Cancer Son     laryngeal  . Stroke Neg Hx   . Other Son     trigeminal neuralgia    History    Social History  . Marital Status: Married    Spouse Name: N/A    Number of Children: 2  . Years of Education: N/A   Occupational History  . grocery store clerk    Social History Main Topics  . Smoking status: Former Smoker -- 1.00 packs/day for 15 years    Types: Cigarettes    Quit date: 12/29/1968  . Smokeless tobacco: Never Used     Comment: smoked 1966- ? 1970, up to 1 ppd  . Alcohol Use: 16.8 oz/week    28 Glasses of wine per week     Comment: 4 a day  . Drug Use: No  . Sexually Active: Not on file   Other Topics Concern  . Not on file   Social History Narrative  . No narrative on file       Physical Exam: Wt 171 lb 2 oz (77.622 kg)  BMI 32.35 kg/m2 Constitutional: generally well-appearing Psychiatric: alert and oriented x3 Eyes: extraocular movements intact Mouth: oral pharynx moist, no lesions Neck: supple no lymphadenopathy Cardiovascular: heart regular rate and rhythm Lungs: clear to auscultation bilaterally Abdomen: soft, nontender, nondistended, no obvious ascites, no peritoneal signs, normal bowel sounds Extremities: no lower extremity edema bilaterally Skin: no lesions on visible extremities Rectal examination with female assistant in the room: Very small amount of external hemorrhoid tissue otherwise the examination was normal with no distal rectal masses, stool is brown, not sent for Hemoccult testing   Assessment and plan: 72 y.o. female with  intermittent constipation, rectal discomfort  Perhaps her constipation has pyrosis for rectal discomfort. She will add Citrucel fiber supplements to her diet. She has not had a colonoscopy in about 11 years would like to proceed with that for her now.

## 2013-06-08 ENCOUNTER — Other Ambulatory Visit: Payer: Medicare Other | Admitting: Gastroenterology

## 2013-06-28 HISTORY — PX: OTHER SURGICAL HISTORY: SHX169

## 2013-06-29 ENCOUNTER — Encounter: Payer: Self-pay | Admitting: Gastroenterology

## 2013-06-29 ENCOUNTER — Ambulatory Visit (AMBULATORY_SURGERY_CENTER): Payer: Medicare Other | Admitting: Gastroenterology

## 2013-06-29 VITALS — BP 101/53 | HR 70 | Temp 97.9°F | Resp 24 | Ht 61.0 in | Wt 171.0 lb

## 2013-06-29 DIAGNOSIS — I10 Essential (primary) hypertension: Secondary | ICD-10-CM | POA: Diagnosis not present

## 2013-06-29 DIAGNOSIS — K5900 Constipation, unspecified: Secondary | ICD-10-CM

## 2013-06-29 DIAGNOSIS — D126 Benign neoplasm of colon, unspecified: Secondary | ICD-10-CM | POA: Diagnosis not present

## 2013-06-29 DIAGNOSIS — E119 Type 2 diabetes mellitus without complications: Secondary | ICD-10-CM | POA: Diagnosis not present

## 2013-06-29 DIAGNOSIS — K219 Gastro-esophageal reflux disease without esophagitis: Secondary | ICD-10-CM | POA: Diagnosis not present

## 2013-06-29 MED ORDER — SODIUM CHLORIDE 0.9 % IV SOLN: 500.0000 mL | INTRAVENOUS | Status: DC

## 2013-06-29 NOTE — Progress Notes (Signed)
Propofol given over incremental dosages. Pt poorly tolerated versed fentanyl sedation. Low does propofol added to sedation for relaxing effect for patient. VSS uneventful after propofol.

## 2013-06-29 NOTE — Patient Instructions (Addendum)
Findings:  Polyps Recommendations: wait for pathology to decide when to repeat colonoscopy  YOU HAD AN ENDOSCOPIC PROCEDURE TODAY AT THE [facility]: Refer to the procedure report that was given to you for any specific questions about what was found during the examination.  If the procedure report does not answer your questions, please call your gastroenterologist to clarify.  If you requested that your care partner not be given the details of your procedure findings, then the procedure report has been included in a sealed envelope for you to review at your convenience later.  YOU SHOULD EXPECT: Some feelings of bloating in the abdomen. Passage of more gas than usual.  Walking can help get rid of the air that was put into your GI tract during the procedure and reduce the bloating. If you had a lower endoscopy (such as a colonoscopy or flexible sigmoidoscopy) you may notice spotting of blood in your stool or on the toilet paper. If you underwent a bowel prep for your procedure, then you may not have a normal bowel movement for a few days.  DIET: Your first meal following the procedure should be a light meal and then it is ok to progress to your normal diet.  A half-sandwich or bowl of soup is an example of a good first meal.  Heavy or fried foods are harder to digest and may make you feel nauseous or bloated.  Likewise meals heavy in dairy and vegetables can cause extra gas to form and this can also increase the bloating.  Drink plenty of fluids but you should avoid alcoholic beverages for 24 hours.  ACTIVITY: Your care partner should take you home directly after the procedure.  You should plan to take it easy, moving slowly for the rest of the day.  You can resume normal activity the day after the procedure however you should NOT DRIVE or use heavy machinery for 24 hours (because of the sedation medicines used during the test).    SYMPTOMS TO REPORT IMMEDIATELY: A gastroenterologist can be  reached at any hour.  During normal business hours, 8:30 AM to 5:00 PM Monday through Friday, call [phone].  After hours and on weekends, please call the GI answering service at [phone] who will take a message and have the physician on call contact you.   Following lower endoscopy (colonoscopy or flexible sigmoidoscopy):  Excessive amounts of blood in the stool  Significant tenderness or worsening of abdominal pains  Swelling of the abdomen that is new, acute  Fever of 100F or higher  FOLLOW UP: If any biopsies were taken you will be contacted by phone or by letter within the next 1-3 weeks.  Call your gastroenterologist if you have not heard about the biopsies in 3 weeks.  Our staff will call the home number listed on your records the next business day following your procedure to check on you and address any questions or concerns that you may have at that time regarding the information given to you following your procedure. This is a courtesy call and so if there is no answer at the home number and we have not heard from you through the emergency physician on call, we will assume that you have returned to your regular daily activities without incident.  SIGNATURES/CONFIDENTIALITY: You and/or your care partner have signed paperwork which will be entered into your electronic medical record.  These signatures attest to the fact that that the information above on your After Visit Summary  has been reviewed and is understood.  Full responsibility of the confidentiality of this discharge information lies with you and/or your care-partner.  Please follow all discharge instructions given to you by the recovery room nurse. If you have any questions or problems after discharge please call one of the numbers listed above. You will receive a phone call in the am to see how you are doing and answer any questions you may have. Thank you for choosing [facility] for your health care  needs.

## 2013-06-29 NOTE — Op Note (Signed)
[facility] [address] [place], [address]   COLONOSCOPY PROCEDURE REPORT  PATIENT: Lisa Larson, Lisa Larson  MR#: [id number] BIRTHDATE: [birth year] , 72  yrs. old GENDER: Female ENDOSCOPIST: Rachael Fee, MD REFERRED [id number] Alwyn Ren, M.D. PROCEDURE DATE:  06/29/2013 PROCEDURE:   Colonoscopy with snare polypectomy ASA CLASS:   Class III INDICATIONS:constipation, rectal discomfort; last colonoscopy >10 years ago. MEDICATIONS: MAC sedation, administered by CRNA, propofol (Diprivan) 20mg  IV, Fentanyl 150 mcg IV, and Versed 5 mg IV  DESCRIPTION OF PROCEDURE:   After the risks benefits and alternatives of the procedure were thoroughly explained, informed consent was obtained.  A digital rectal exam revealed no abnormalities of the rectum.   The LB [id number]  endoscope was introduced through the anus and advanced to the cecum, which was identified by both the appendix and ileocecal valve. No adverse events experienced.   The quality of the prep was good.  The instrument was then slowly withdrawn as the colon was fully examined.  COLON FINDINGS: Two sessile polyps were found, removed and sent to pathology.  Theses ranged in size from 2mm to 4mm, located in ascending and transverse segments, removed with cold snare.  The examination was otherwise normal.  Retroflexed views revealed no abnormalities. The time to cecum=3 minutes 20 seconds.  Withdrawal time=7 minutes 45 seconds.  The scope was withdrawn and the procedure completed. COMPLICATIONS: There were no complications.  ENDOSCOPIC IMPRESSION: Two sessile polyps were found, removed and sent to pathology. The examination was otherwise normal.  RECOMMENDATIONS: If the polyp(s) removed today are proven to be adenomatous (pre-cancerous) polyps, you will need a repeat colonoscopy in 5 years.  Otherwise you should continue to follow colorectal cancer screening guidelines for "routine risk" patients with  colonoscopy in 10 years.  You will receive a letter within 1-2 weeks with the results of your biopsy as well as final recommendations.  Please call my office if you have not received a letter after 3 weeks. Please start the daily fiber supplement that was recommended at your last office visit.   eSigned:  Rachael Fee, MD 06/29/2013 10:23 AM

## 2013-06-29 NOTE — Progress Notes (Signed)
Called to room to assist during endoscopic procedure.  Patient ID and intended procedure confirmed with present staff. Received instructions for my participation in the procedure from the performing physician.  

## 2013-06-29 NOTE — Progress Notes (Signed)
Patient did not experience any of the following events: a burn prior to discharge; a fall within the facility; wrong site/side/patient/procedure/implant event; or a hospital transfer or hospital admission upon discharge from the facility. (G8907) Patient did not have preoperative order for IV antibiotic SSI prophylaxis. (G8918)  

## 2013-06-30 ENCOUNTER — Telehealth: Payer: Self-pay

## 2013-06-30 NOTE — Progress Notes (Signed)
The following G-Codes are for the date of service 06-29-2013:  Patient did not experience any of the following events: a burn prior to discharge; a fall within the facility; wrong site/side/patient/procedure/implant event; or a hospital transfer or hospital admission upon discharge from the facility. (G8907)Patient did not have preoperative order for IV antibiotic SSI prophylaxis. (G8918) 

## 2013-06-30 NOTE — Telephone Encounter (Deleted)
#  [phone] tel has been disconnected. maw

## 2013-06-30 NOTE — Telephone Encounter (Signed)
Left a message on the pt's answering machine to call us if she has any questions or concerns. Maw  Pt tel # [phone].  It was incorrect on the call back screen. Maw

## 2013-07-07 ENCOUNTER — Ambulatory Visit (INDEPENDENT_AMBULATORY_CARE_PROVIDER_SITE_OTHER)
Admission: RE | Admit: 2013-07-07 | Discharge: 2013-07-07 | Disposition: A | Discharge status: Home / Self Care | Payer: Medicare Other | Source: Ambulatory Visit | Attending: Pulmonary Disease | Admitting: Pulmonary Disease

## 2013-07-07 ENCOUNTER — Ambulatory Visit (INDEPENDENT_AMBULATORY_CARE_PROVIDER_SITE_OTHER): Payer: Medicare Other | Admitting: Pulmonary Disease

## 2013-07-07 ENCOUNTER — Encounter: Payer: Self-pay | Admitting: Pulmonary Disease

## 2013-07-07 VITALS — BP 110/62 | HR 75 | Temp 98.0°F | Ht 60.25 in | Wt 172.4 lb

## 2013-07-07 DIAGNOSIS — J45909 Unspecified asthma, uncomplicated: Secondary | ICD-10-CM | POA: Diagnosis not present

## 2013-07-07 DIAGNOSIS — J4540 Moderate persistent asthma, uncomplicated: Secondary | ICD-10-CM

## 2013-07-07 DIAGNOSIS — J8289 Other pulmonary eosinophilia, not elsewhere classified: Secondary | ICD-10-CM | POA: Diagnosis not present

## 2013-07-07 DIAGNOSIS — J309 Allergic rhinitis, unspecified: Secondary | ICD-10-CM

## 2013-07-07 DIAGNOSIS — J984 Other disorders of lung: Secondary | ICD-10-CM | POA: Diagnosis not present

## 2013-07-07 MED ORDER — PREDNISONE 5 MG PO TABS: 5.0000 mg | ORAL_TABLET | Freq: Every day | ORAL | Status: DC

## 2013-07-07 MED ORDER — PREDNISONE 2.5 MG PO TABS: 2.5000 mg | ORAL_TABLET | Freq: Every day | ORAL | Status: DC

## 2013-07-07 NOTE — Assessment & Plan Note (Signed)
Stable on prn nasonex and claritin.

## 2013-07-07 NOTE — Patient Instructions (Signed)
Prednisone 7.5 mg daily until next visit Follow up in 2 months with chest xray, CBC with differential, and CMET

## 2013-07-07 NOTE — Assessment & Plan Note (Signed)
Continue dulera and prn proair.

## 2013-07-07 NOTE — Assessment & Plan Note (Signed)
Stable clinically and on chest xray.  Will decrease prednisone to 7.5 mg daily.  Will f/u CXR and labs in 2 months

## 2013-07-07 NOTE — Progress Notes (Signed)
Chief Complaint  Patient presents with  . Follow-up    W/ CXR. Pt reports of being slight hoarse, slight dry cough. Pt states at times she wakes up with "strange sounds" in her chest. Does not sound like wheezing.     History of Present Illness: Lisa Larson is a 72 y.o. 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  She gets occasional hoarseness and cough.  She is not having sputum, fever, chest pain, wheeze, hemoptysis, skin rash, joint pain, or swelling.  She uses albuterol about once per week.  She has to use nasonex about twice per week.  Tests: CBC 01/14/11>>39% eosinophils 01/14/11>> HIV negative, ACE 39, RF negative, ANA 1:40, ANCA negative  CT chest 01/16/11>>Multifocal b/l ASD with GGO BAL 01/21/11>>48% Eosinophils  IgE 02/25/11>>282 CT chest 07/22/11>>resolution of ASD CT chest 10/03/11>>recurrence of nodular infiltrate Rt upper lobe 10/08/11>>resume prednisone 10/08/11 CXR 08/19/12>>no acute findings PFT 11/04/12>>FEV1 1.77 (87%), FEV1% 68, TLC 4.87 (101%), DLCO 69%, +BD from FEF 25-75%. CXR 02/21/13 >> Rt lung scarring, no change  She  has a past medical history of Eosinophilic pneumonia (January 2012); Asthma; Hypertension; Osteopenia; GERD (gastroesophageal reflux disease); Depression; Hyperlipidemia; Anxiety; Baker's cyst, ruptured (2012); Arthritis; Chronic headaches; COPD (chronic obstructive pulmonary disease); Status post dilation of esophageal narrowing; and Lipoma.  She  has past surgical history that includes Shoulder surgery (Left, 08-2008); Total abdominal hysterectomy; Tonsillectomy and adenoidectomy; Colonoscopy; Nasal sinus surgery; Appendectomy; Esophageal dilation; Bronchoscopy (12-2010); and Cataract extraction w/ intraocular lens  implant, bilateral (Bilateral).   Current Outpatient Prescriptions on File Prior to Visit  Medication Sig Dispense Refill  . albuterol (PROAIR HFA) 108 (90 BASE) MCG/ACT inhaler Inhale 2 puffs into the lungs every 6  (six) hours as needed.  18 g  11  . allopurinol (ZYLOPRIM) 300 MG tablet TAKE 1/2 TABLET BY MOUTH EVERY DAY  45 tablet  3  . amLODipine (NORVASC) 10 MG tablet Take 1 tablet (10 mg total) by mouth daily.  90 tablet  3  . Cetirizine HCl 10 MG CAPS Take 10 mg by mouth daily.      Marland Kitchen esomeprazole (NEXIUM) 40 MG capsule Take 40 mg by mouth daily before breakfast.      . LORazepam (ATIVAN) 1 MG tablet TAKE 1 TABLET BY MOUTH EVERY 8 HOURS AS NEEDED Do not take within 8-12 hrs of alcohol  30 tablet  0  . losartan (COZAAR) 100 MG tablet TAKE 1 TABLET BY MOUTH EVERY DAY  90 tablet  3  . mometasone (NASONEX) 50 MCG/ACT nasal spray Place 2 sprays into the nose daily as needed.  17 g  11  . mometasone-formoterol (DULERA) 100-5 MCG/ACT AERO Inhale 2 puffs into the lungs 2 (two) times daily.  1 Inhaler  5  . ondansetron (ZOFRAN) 4 MG tablet Take 1 tablet (4 mg total) by mouth every 8 (eight) hours as needed for nausea.  20 tablet  1  . predniSONE (DELTASONE) 10 MG tablet Take 1 tablet (10 mg total) by mouth daily.  30 tablet  2  . sertraline (ZOLOFT) 100 MG tablet TAKE 1 TABLET BY MOUTH EVERY DAY  90 tablet  3  . thiamine (VITAMIN B-1) 100 MG tablet Take 100 mg by mouth daily.      . traMADol (ULTRAM) 50 MG tablet 1/2-1 by mouth every 6 hours as needed  60 tablet  1   No current facility-administered medications on file prior to visit.    Allergies  Allergen Reactions  . Sulfonamide Derivatives  Rash; dyspnea  . Benazepril Hcl     No PMH of angioedema; ACE-I caused cough  . Diclofenac   . Oxycodone-Aspirin   . Rofecoxib     Physical Exam:  General - obese HEENT - no sinus tenderness, no oral exudate, no LAN Chest - no wheeze/rales/dullness Cardiac - s1s2 regular, no murmur Abd - soft, nontender Ext - no edema Neuro - normal strength Skin - no rash Psych - normal mood, behavior    CBC    Component Value Date/Time   WBC 7.1 05/25/2013 1150   RBC 4.30 05/25/2013 1150   HGB 13.9  05/25/2013 1150   HCT 40.7 05/25/2013 1150   PLT 153.0 05/25/2013 1150   MCV 94.6 05/25/2013 1150   MCHC 34.2 05/25/2013 1150   RDW 14.3 05/25/2013 1150   LYMPHSABS 0.8 05/25/2013 1150   MONOABS 0.3 05/25/2013 1150   EOSABS 0.2 05/25/2013 1150   BASOSABS 0.0 05/25/2013 1150     Dg Chest 2 View  07/07/2013   *RADIOLOGY REPORT*  Clinical Data: Recent pneumonia; cough  CHEST - 2 VIEW  Comparison: May 25, 2013  Findings: There is persistent scarring in right middle lobe and lingula.  No edema or consolidation is appreciated on this study. There is no new opacity.  Heart size and pulmonary vascularity are normal.  No adenopathy. There is degenerative change in the thoracic spine.  IMPRESSION: Scarring in the right middle lobe and lingula, stable.  No frank edema or consolidation.  No new opacity.   Original Report Authenticated By: Bretta Bang, M.D.      Assessment/Plan:  [name], MD [facility] Pulmonary/Critical Care 07/07/2013, 1:54 PM Pager:  [phone] After 3pm call: [phone]

## 2013-07-10 ENCOUNTER — Encounter: Payer: Self-pay | Admitting: Gastroenterology

## 2013-08-01 ENCOUNTER — Telehealth: Payer: Self-pay | Admitting: Internal Medicine

## 2013-08-01 ENCOUNTER — Telehealth: Payer: Self-pay | Admitting: Pulmonary Disease

## 2013-08-01 MED ORDER — ESOMEPRAZOLE MAGNESIUM 40 MG PO CPDR: 40.0000 mg | DELAYED_RELEASE_CAPSULE | Freq: Every day | ORAL | Status: DC

## 2013-08-01 MED ORDER — MOMETASONE FURO-FORMOTEROL FUM 100-5 MCG/ACT IN AERO: 2.0000 | INHALATION_SPRAY | Freq: Two times a day (BID) | RESPIRATORY_TRACT | Status: DC

## 2013-08-01 NOTE — Telephone Encounter (Signed)
RX sent

## 2013-08-01 NOTE — Telephone Encounter (Signed)
Samples are up front for pick up. Pt is aware. 

## 2013-08-01 NOTE — Telephone Encounter (Signed)
Patient left vm on triage line stating she needs rx for nexium sent to [facility] [address]. She was recently given samples.

## 2013-08-16 DIAGNOSIS — M25529 Pain in unspecified elbow: Secondary | ICD-10-CM | POA: Diagnosis not present

## 2013-08-16 DIAGNOSIS — S5000XA Contusion of unspecified elbow, initial encounter: Secondary | ICD-10-CM | POA: Diagnosis not present

## 2013-08-16 DIAGNOSIS — S42209A Unspecified fracture of upper end of unspecified humerus, initial encounter for closed fracture: Secondary | ICD-10-CM | POA: Diagnosis not present

## 2013-08-17 DIAGNOSIS — S42309D Unspecified fracture of shaft of humerus, unspecified arm, subsequent encounter for fracture with routine healing: Secondary | ICD-10-CM | POA: Diagnosis not present

## 2013-08-17 DIAGNOSIS — F329 Major depressive disorder, single episode, unspecified: Secondary | ICD-10-CM | POA: Diagnosis not present

## 2013-08-17 DIAGNOSIS — S40029A Contusion of unspecified upper arm, initial encounter: Secondary | ICD-10-CM | POA: Diagnosis not present

## 2013-08-17 DIAGNOSIS — I10 Essential (primary) hypertension: Secondary | ICD-10-CM | POA: Diagnosis not present

## 2013-08-17 DIAGNOSIS — S40019A Contusion of unspecified shoulder, initial encounter: Secondary | ICD-10-CM | POA: Diagnosis not present

## 2013-08-17 DIAGNOSIS — R269 Unspecified abnormalities of gait and mobility: Secondary | ICD-10-CM | POA: Diagnosis not present

## 2013-08-18 ENCOUNTER — Other Ambulatory Visit: Payer: Medicare Other

## 2013-08-18 ENCOUNTER — Telehealth: Payer: Self-pay

## 2013-08-18 DIAGNOSIS — S40029A Contusion of unspecified upper arm, initial encounter: Secondary | ICD-10-CM | POA: Diagnosis not present

## 2013-08-18 DIAGNOSIS — S42309D Unspecified fracture of shaft of humerus, unspecified arm, subsequent encounter for fracture with routine healing: Secondary | ICD-10-CM | POA: Diagnosis not present

## 2013-08-18 DIAGNOSIS — S40019A Contusion of unspecified shoulder, initial encounter: Secondary | ICD-10-CM | POA: Diagnosis not present

## 2013-08-18 DIAGNOSIS — R269 Unspecified abnormalities of gait and mobility: Secondary | ICD-10-CM | POA: Diagnosis not present

## 2013-08-18 DIAGNOSIS — F329 Major depressive disorder, single episode, unspecified: Secondary | ICD-10-CM | POA: Diagnosis not present

## 2013-08-18 DIAGNOSIS — I10 Essential (primary) hypertension: Secondary | ICD-10-CM | POA: Diagnosis not present

## 2013-08-18 NOTE — Telephone Encounter (Signed)
Message left on triage voicemail: patient with recent fall, fell and broke shoulder (followed up by ortho). Patient is unable to get around due to recent fall. Patient with pain and burning when urinating and would like to know if MD will send in rx for ABX.  I called [name] and informed her that ABX requires an appointment. [name] indicated that she is having the hardest time with her mother right now and is absolutely unable to bring her in. [name] indicated a home health nurse is coming out today and questions if they can collect a clean catch and if she can drop sample urine sample for U/A and culture. She would like to know if a ABX will be prescribed then, I advised [name] that I will send message to covering MD to further advise

## 2013-08-18 NOTE — Telephone Encounter (Signed)
Ok for [facility] to collect a urine and either home health or family member can bring urine to office to run UA.  If UA/culture is + will prescribe abx

## 2013-08-18 NOTE — Telephone Encounter (Signed)
Patient's daughter was informed of Dr.Tabori's response and verbalized understanding. Per Charlean Sanfilippo [facility]) - place patient on lab schedule for today, urine specimen will be processed and resulted to covering MD

## 2013-08-19 DIAGNOSIS — F329 Major depressive disorder, single episode, unspecified: Secondary | ICD-10-CM | POA: Diagnosis not present

## 2013-08-19 DIAGNOSIS — S40019A Contusion of unspecified shoulder, initial encounter: Secondary | ICD-10-CM | POA: Diagnosis not present

## 2013-08-19 DIAGNOSIS — I10 Essential (primary) hypertension: Secondary | ICD-10-CM | POA: Diagnosis not present

## 2013-08-19 DIAGNOSIS — S40029A Contusion of unspecified upper arm, initial encounter: Secondary | ICD-10-CM | POA: Diagnosis not present

## 2013-08-19 DIAGNOSIS — R269 Unspecified abnormalities of gait and mobility: Secondary | ICD-10-CM | POA: Diagnosis not present

## 2013-08-19 DIAGNOSIS — S42309D Unspecified fracture of shaft of humerus, unspecified arm, subsequent encounter for fracture with routine healing: Secondary | ICD-10-CM | POA: Diagnosis not present

## 2013-08-20 ENCOUNTER — Ambulatory Visit (INDEPENDENT_AMBULATORY_CARE_PROVIDER_SITE_OTHER): Payer: Medicare Other | Admitting: Internal Medicine

## 2013-08-20 ENCOUNTER — Encounter: Payer: Self-pay | Admitting: Internal Medicine

## 2013-08-20 VITALS — Temp 98.3°F | Wt 172.0 lb

## 2013-08-20 DIAGNOSIS — R350 Frequency of micturition: Secondary | ICD-10-CM

## 2013-08-20 DIAGNOSIS — N390 Urinary tract infection, site not specified: Secondary | ICD-10-CM | POA: Insufficient documentation

## 2013-08-20 LAB — POCT URINALYSIS DIPSTICK
Glucose, UA: NEGATIVE
Spec Grav, UA: 1.015
Urobilinogen, UA: 0.2
pH, UA: 6.5

## 2013-08-20 MED ORDER — CIPROFLOXACIN HCL 500 MG PO TABS: 500.0000 mg | ORAL_TABLET | Freq: Two times a day (BID) | ORAL | Status: DC

## 2013-08-20 NOTE — Progress Notes (Signed)
Subjective:    Patient ID: Lisa Larson, female    DOB: 11-Aug-1941, 72 y.o.   MRN: [id number]  HPI Here with daughter Just broke shoulder--- had brought urine to [facility] office (home health nurse brought it) to keep her from having to go out  Started with dizziness 6 days ago Then noted increased urinary frequency Fell that night and couldn't get up---- fractured left shoulder (and knee is weak also)  Ongoing dysuria and frequency Burning Using depends--- having incontinence No fever  Current Outpatient Prescriptions on File Prior to Visit  Medication Sig Dispense Refill  . albuterol (PROAIR HFA) 108 (90 BASE) MCG/ACT inhaler Inhale 2 puffs into the lungs every 6 (six) hours as needed.  18 g  11  . allopurinol (ZYLOPRIM) 300 MG tablet TAKE 1/2 TABLET BY MOUTH EVERY DAY  45 tablet  3  . amLODipine (NORVASC) 10 MG tablet Take 1 tablet (10 mg total) by mouth daily.  90 tablet  3  . Cetirizine HCl 10 MG CAPS Take 10 mg by mouth daily.      Marland Kitchen esomeprazole (NEXIUM) 40 MG capsule Take 1 capsule (40 mg total) by mouth daily before breakfast.  30 capsule  5  . LORazepam (ATIVAN) 1 MG tablet TAKE 1 TABLET BY MOUTH EVERY 8 HOURS AS NEEDED Do not take within 8-12 hrs of alcohol  30 tablet  0  . losartan (COZAAR) 100 MG tablet TAKE 1 TABLET BY MOUTH EVERY DAY  90 tablet  3  . mometasone (NASONEX) 50 MCG/ACT nasal spray Place 2 sprays into the nose daily as needed.  17 g  11  . mometasone-formoterol (DULERA) 100-5 MCG/ACT AERO Inhale 2 puffs into the lungs 2 (two) times daily.  2 Inhaler  0  . ondansetron (ZOFRAN) 4 MG tablet Take 1 tablet (4 mg total) by mouth every 8 (eight) hours as needed for nausea.  20 tablet  1  . predniSONE (DELTASONE) 2.5 MG tablet Take 1 tablet (2.5 mg total) by mouth daily.  30 tablet  3  . predniSONE (DELTASONE) 5 MG tablet Take 1 tablet (5 mg total) by mouth daily.  30 tablet  3  . sertraline (ZOLOFT) 100 MG tablet TAKE 1 TABLET BY MOUTH EVERY DAY  90 tablet  3   . thiamine (VITAMIN B-1) 100 MG tablet Take 100 mg by mouth daily.      . traMADol (ULTRAM) 50 MG tablet 1/2-1 by mouth every 6 hours as needed  60 tablet  1   No current facility-administered medications on file prior to visit.    Allergies  Allergen Reactions  . Sulfonamide Derivatives     Rash; dyspnea  . Benazepril Hcl     No PMH of angioedema; ACE-I caused cough  . Diclofenac   . Oxycodone-Aspirin   . Rofecoxib     Past Medical History  Diagnosis Date  . 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 Asthma   . Hypertension   . Osteopenia     BMD T score-1.6 at L femoral neck 11-27-2009, s/p fosamax x 5 years  . GERD (gastroesophageal reflux disease)   . Depression   . Hyperlipidemia   . Anxiety   . Baker's cyst, ruptured 2012    right  . Arthritis   . Chronic headaches   . COPD (chronic obstructive pulmonary disease)   . Status post dilation of esophageal narrowing   . Lipoma     Past Surgical History  Procedure Laterality Date  . Shoulder surgery Left  08-2008    fracture repair, Dr. Gean Birchwood  . Total abdominal hysterectomy    . Tonsillectomy and adenoidectomy    . Colonoscopy      negative  . Nasal sinus surgery    . Appendectomy    . Esophageal dilation      Dr Juanda Chance  . Bronchoscopy  12-2010    Dr. Craige Cotta  . Cataract extraction w/ intraocular lens  implant, bilateral Bilateral     Family History  Problem Relation Age of Onset  . Arthritis Father   . Rheum arthritis Father   . Hypertension Father   . Hypertension Mother   . Alzheimer's disease Mother   . Hypertension Brother   . Diabetes Brother   . Cancer Son     laryngeal  . Stroke Neg Hx   . Other Son     trigeminal neuralgia    History   Social History  . Marital Status: Widowed    Spouse Name: N/A    Number of Children: 2  . Years of Education: N/A   Occupational History  . grocery store clerk    Social History Main Topics  . Smoking status: Former Smoker -- 1.00 packs/day for 15  years    Types: Cigarettes    Quit date: 12/29/1968  . Smokeless tobacco: Never Used     Comment: smoked 1966- ? 1970, up to 1 ppd  . Alcohol Use: 16.8 oz/week    28 Glasses of wine per week     Comment: 4 a day  . Drug Use: No  . Sexual Activity: Not on file   Other Topics Concern  . Not on file   Social History Narrative  . No narrative on file   Review of Systems Some nausea No sig abdominal pain Appetite is off Ongoing back pain--may be some worse lately Some constipation--better with citrucel    Objective:   Physical Exam  Constitutional: She appears well-developed and well-nourished. No distress.  Abdominal: She exhibits no distension. There is no tenderness. There is no rebound and no guarding.  Musculoskeletal:  Bruising along left shoulder/chest/arm---in sling No CVA tenderness          Assessment & Plan:

## 2013-08-20 NOTE — Assessment & Plan Note (Signed)
Fairly clear symptoms and abnormal urinalysis Not enough to send and no recurrent infections Symptoms started before the fall---might have contributed to her weakness  Will treat with cipro

## 2013-08-22 DIAGNOSIS — F329 Major depressive disorder, single episode, unspecified: Secondary | ICD-10-CM | POA: Diagnosis not present

## 2013-08-22 DIAGNOSIS — S42309D Unspecified fracture of shaft of humerus, unspecified arm, subsequent encounter for fracture with routine healing: Secondary | ICD-10-CM | POA: Diagnosis not present

## 2013-08-22 DIAGNOSIS — S40019A Contusion of unspecified shoulder, initial encounter: Secondary | ICD-10-CM | POA: Diagnosis not present

## 2013-08-22 DIAGNOSIS — R269 Unspecified abnormalities of gait and mobility: Secondary | ICD-10-CM | POA: Diagnosis not present

## 2013-08-22 DIAGNOSIS — S40029A Contusion of unspecified upper arm, initial encounter: Secondary | ICD-10-CM | POA: Diagnosis not present

## 2013-08-22 DIAGNOSIS — I10 Essential (primary) hypertension: Secondary | ICD-10-CM | POA: Diagnosis not present

## 2013-08-24 DIAGNOSIS — R269 Unspecified abnormalities of gait and mobility: Secondary | ICD-10-CM | POA: Diagnosis not present

## 2013-08-24 DIAGNOSIS — I10 Essential (primary) hypertension: Secondary | ICD-10-CM | POA: Diagnosis not present

## 2013-08-24 DIAGNOSIS — S40029A Contusion of unspecified upper arm, initial encounter: Secondary | ICD-10-CM | POA: Diagnosis not present

## 2013-08-24 DIAGNOSIS — S40019A Contusion of unspecified shoulder, initial encounter: Secondary | ICD-10-CM | POA: Diagnosis not present

## 2013-08-24 DIAGNOSIS — S42309D Unspecified fracture of shaft of humerus, unspecified arm, subsequent encounter for fracture with routine healing: Secondary | ICD-10-CM | POA: Diagnosis not present

## 2013-08-24 DIAGNOSIS — F329 Major depressive disorder, single episode, unspecified: Secondary | ICD-10-CM | POA: Diagnosis not present

## 2013-08-25 DIAGNOSIS — S42309D Unspecified fracture of shaft of humerus, unspecified arm, subsequent encounter for fracture with routine healing: Secondary | ICD-10-CM | POA: Diagnosis not present

## 2013-08-25 DIAGNOSIS — F329 Major depressive disorder, single episode, unspecified: Secondary | ICD-10-CM | POA: Diagnosis not present

## 2013-08-25 DIAGNOSIS — R269 Unspecified abnormalities of gait and mobility: Secondary | ICD-10-CM | POA: Diagnosis not present

## 2013-08-25 DIAGNOSIS — S40029A Contusion of unspecified upper arm, initial encounter: Secondary | ICD-10-CM | POA: Diagnosis not present

## 2013-08-25 DIAGNOSIS — S40019A Contusion of unspecified shoulder, initial encounter: Secondary | ICD-10-CM | POA: Diagnosis not present

## 2013-08-25 DIAGNOSIS — I10 Essential (primary) hypertension: Secondary | ICD-10-CM | POA: Diagnosis not present

## 2013-08-26 ENCOUNTER — Telehealth: Payer: Self-pay | Admitting: Internal Medicine

## 2013-08-26 DIAGNOSIS — S40019A Contusion of unspecified shoulder, initial encounter: Secondary | ICD-10-CM | POA: Diagnosis not present

## 2013-08-26 DIAGNOSIS — I10 Essential (primary) hypertension: Secondary | ICD-10-CM | POA: Diagnosis not present

## 2013-08-26 DIAGNOSIS — S42309D Unspecified fracture of shaft of humerus, unspecified arm, subsequent encounter for fracture with routine healing: Secondary | ICD-10-CM | POA: Diagnosis not present

## 2013-08-26 DIAGNOSIS — F329 Major depressive disorder, single episode, unspecified: Secondary | ICD-10-CM | POA: Diagnosis not present

## 2013-08-26 DIAGNOSIS — R269 Unspecified abnormalities of gait and mobility: Secondary | ICD-10-CM | POA: Diagnosis not present

## 2013-08-26 DIAGNOSIS — S40029A Contusion of unspecified upper arm, initial encounter: Secondary | ICD-10-CM | POA: Diagnosis not present

## 2013-08-26 NOTE — Telephone Encounter (Signed)
Last refill:05-13-13 Last OV:05-26-13 UDS:03-11-13-Mod Risk-UDS due. Please advise.//AB/CMA

## 2013-08-30 ENCOUNTER — Other Ambulatory Visit: Payer: Self-pay | Admitting: Internal Medicine

## 2013-08-30 DIAGNOSIS — S40019A Contusion of unspecified shoulder, initial encounter: Secondary | ICD-10-CM | POA: Diagnosis not present

## 2013-08-30 DIAGNOSIS — F329 Major depressive disorder, single episode, unspecified: Secondary | ICD-10-CM | POA: Diagnosis not present

## 2013-08-30 DIAGNOSIS — S42209A Unspecified fracture of upper end of unspecified humerus, initial encounter for closed fracture: Secondary | ICD-10-CM | POA: Diagnosis not present

## 2013-08-30 DIAGNOSIS — S40029A Contusion of unspecified upper arm, initial encounter: Secondary | ICD-10-CM | POA: Diagnosis not present

## 2013-08-30 DIAGNOSIS — R269 Unspecified abnormalities of gait and mobility: Secondary | ICD-10-CM | POA: Diagnosis not present

## 2013-08-30 DIAGNOSIS — M1710 Unilateral primary osteoarthritis, unspecified knee: Secondary | ICD-10-CM | POA: Diagnosis not present

## 2013-08-30 DIAGNOSIS — S42309D Unspecified fracture of shaft of humerus, unspecified arm, subsequent encounter for fracture with routine healing: Secondary | ICD-10-CM | POA: Diagnosis not present

## 2013-08-30 DIAGNOSIS — I10 Essential (primary) hypertension: Secondary | ICD-10-CM

## 2013-08-30 NOTE — Telephone Encounter (Signed)
[facility] and [facility]

## 2013-08-30 NOTE — Telephone Encounter (Signed)
Please arrange UDS, okay to refill, #30, Rx printed

## 2013-08-31 DIAGNOSIS — S42309D Unspecified fracture of shaft of humerus, unspecified arm, subsequent encounter for fracture with routine healing: Secondary | ICD-10-CM | POA: Diagnosis not present

## 2013-08-31 DIAGNOSIS — F329 Major depressive disorder, single episode, unspecified: Secondary | ICD-10-CM | POA: Diagnosis not present

## 2013-08-31 DIAGNOSIS — R269 Unspecified abnormalities of gait and mobility: Secondary | ICD-10-CM | POA: Diagnosis not present

## 2013-08-31 DIAGNOSIS — S40029A Contusion of unspecified upper arm, initial encounter: Secondary | ICD-10-CM | POA: Diagnosis not present

## 2013-08-31 DIAGNOSIS — I10 Essential (primary) hypertension: Secondary | ICD-10-CM | POA: Diagnosis not present

## 2013-08-31 DIAGNOSIS — S40019A Contusion of unspecified shoulder, initial encounter: Secondary | ICD-10-CM | POA: Diagnosis not present

## 2013-09-01 DIAGNOSIS — I10 Essential (primary) hypertension: Secondary | ICD-10-CM | POA: Diagnosis not present

## 2013-09-01 DIAGNOSIS — R269 Unspecified abnormalities of gait and mobility: Secondary | ICD-10-CM | POA: Diagnosis not present

## 2013-09-01 DIAGNOSIS — S42309D Unspecified fracture of shaft of humerus, unspecified arm, subsequent encounter for fracture with routine healing: Secondary | ICD-10-CM | POA: Diagnosis not present

## 2013-09-01 DIAGNOSIS — F329 Major depressive disorder, single episode, unspecified: Secondary | ICD-10-CM | POA: Diagnosis not present

## 2013-09-01 DIAGNOSIS — S40019A Contusion of unspecified shoulder, initial encounter: Secondary | ICD-10-CM | POA: Diagnosis not present

## 2013-09-01 DIAGNOSIS — S40029A Contusion of unspecified upper arm, initial encounter: Secondary | ICD-10-CM | POA: Diagnosis not present

## 2013-09-02 DIAGNOSIS — S42309D Unspecified fracture of shaft of humerus, unspecified arm, subsequent encounter for fracture with routine healing: Secondary | ICD-10-CM | POA: Diagnosis not present

## 2013-09-02 DIAGNOSIS — S40019A Contusion of unspecified shoulder, initial encounter: Secondary | ICD-10-CM | POA: Diagnosis not present

## 2013-09-02 DIAGNOSIS — S40029A Contusion of unspecified upper arm, initial encounter: Secondary | ICD-10-CM | POA: Diagnosis not present

## 2013-09-02 DIAGNOSIS — F329 Major depressive disorder, single episode, unspecified: Secondary | ICD-10-CM | POA: Diagnosis not present

## 2013-09-02 DIAGNOSIS — I10 Essential (primary) hypertension: Secondary | ICD-10-CM | POA: Diagnosis not present

## 2013-09-02 DIAGNOSIS — R269 Unspecified abnormalities of gait and mobility: Secondary | ICD-10-CM | POA: Diagnosis not present

## 2013-09-06 ENCOUNTER — Telehealth: Payer: Self-pay | Admitting: Internal Medicine

## 2013-09-06 DIAGNOSIS — S42309D Unspecified fracture of shaft of humerus, unspecified arm, subsequent encounter for fracture with routine healing: Secondary | ICD-10-CM | POA: Diagnosis not present

## 2013-09-06 DIAGNOSIS — S40019A Contusion of unspecified shoulder, initial encounter: Secondary | ICD-10-CM | POA: Diagnosis not present

## 2013-09-06 DIAGNOSIS — S40029A Contusion of unspecified upper arm, initial encounter: Secondary | ICD-10-CM | POA: Diagnosis not present

## 2013-09-06 DIAGNOSIS — R269 Unspecified abnormalities of gait and mobility: Secondary | ICD-10-CM | POA: Diagnosis not present

## 2013-09-06 DIAGNOSIS — I10 Essential (primary) hypertension: Secondary | ICD-10-CM | POA: Diagnosis not present

## 2013-09-06 DIAGNOSIS — F329 Major depressive disorder, single episode, unspecified: Secondary | ICD-10-CM | POA: Diagnosis not present

## 2013-09-06 NOTE — Telephone Encounter (Signed)
Spoke with Diane the pt nurse. She states that the pt had swelling of her feet last week. States that it is a +1 and is bilateral in both feet and ankles. Nurse states pt has no complaints of pain, nausea, dizziness. Nurse also advised that pt has been treated with Cipro for a UTI and diflucan for a spot under her arm. Would like to know what you advise and if the abx could be the cause.

## 2013-09-06 NOTE — Telephone Encounter (Signed)
Patient Information:  Caller Name: Donia Pounds  Phone: [phone]  Patient: Lisa Larson, Lisa Larson  Gender: Female  DOB: [birth year]  Age: 72 Years  PCP: Marga Melnick  Office Follow Up:  Does the office need to follow up with this patient?: Yes  Instructions For The Office: Diane from [place] would like a call back to hear Dr.Hopper's thoughts on the edema  RN Note:  offered to schedule appt, but Diane from [place] wanted to send message to the office and see what Dr.Hopper recommended  Symptoms  Reason For Call & Symptoms: c/o swelling to feet/ankles; says swelling has worsened; +1 at this time; denies SOB or any other sxs; Imari says that she has never had swelling in her feet, other than during pregnancy, so feels like this is an abnormal sx for her  Reviewed Health History In EMR: Yes  Reviewed Medications In EMR: Yes  Reviewed Allergies In EMR: Yes  Reviewed Surgeries / Procedures: Yes  Date of Onset of Symptoms: 08/30/2013  Treatments Tried: elevating feet  Treatments Tried Worked: No  Guideline(s) Used:  Leg Swelling and Edema  Disposition Per Guideline:   See Within 3 Days in Office  Reason For Disposition Reached:   Mild swelling of both ankles (i.e., pedal edema) AND new onset or worsening  Advice Given:  N/A  RN Overrode Recommendation:  Follow Up With Office Later  would like to hear from the office

## 2013-09-06 NOTE — Telephone Encounter (Signed)
I recommend fasting hepatic panel and BMET to assess possible causes of the edema. Office visit 1-2 days after labs done.

## 2013-09-07 ENCOUNTER — Ambulatory Visit: Payer: Medicare Other | Admitting: Pulmonary Disease

## 2013-09-07 DIAGNOSIS — R269 Unspecified abnormalities of gait and mobility: Secondary | ICD-10-CM | POA: Diagnosis not present

## 2013-09-07 DIAGNOSIS — S42309D Unspecified fracture of shaft of humerus, unspecified arm, subsequent encounter for fracture with routine healing: Secondary | ICD-10-CM | POA: Diagnosis not present

## 2013-09-07 DIAGNOSIS — S40029A Contusion of unspecified upper arm, initial encounter: Secondary | ICD-10-CM | POA: Diagnosis not present

## 2013-09-07 DIAGNOSIS — I10 Essential (primary) hypertension: Secondary | ICD-10-CM | POA: Diagnosis not present

## 2013-09-07 DIAGNOSIS — F329 Major depressive disorder, single episode, unspecified: Secondary | ICD-10-CM | POA: Diagnosis not present

## 2013-09-07 DIAGNOSIS — S40019A Contusion of unspecified shoulder, initial encounter: Secondary | ICD-10-CM | POA: Diagnosis not present

## 2013-09-07 NOTE — Telephone Encounter (Signed)
Spoke w/pt. She made appts for 09/08/13 and 09/09/13, but is having transportation issues. May need to call back and reschedule.

## 2013-09-08 ENCOUNTER — Other Ambulatory Visit (INDEPENDENT_AMBULATORY_CARE_PROVIDER_SITE_OTHER): Payer: Medicare Other

## 2013-09-08 ENCOUNTER — Encounter: Payer: Self-pay | Admitting: Internal Medicine

## 2013-09-08 DIAGNOSIS — M109 Gout, unspecified: Secondary | ICD-10-CM | POA: Diagnosis not present

## 2013-09-08 DIAGNOSIS — R7401 Elevation of levels of liver transaminase levels: Secondary | ICD-10-CM | POA: Diagnosis not present

## 2013-09-08 DIAGNOSIS — R7309 Other abnormal glucose: Secondary | ICD-10-CM

## 2013-09-08 DIAGNOSIS — R269 Unspecified abnormalities of gait and mobility: Secondary | ICD-10-CM | POA: Diagnosis not present

## 2013-09-08 DIAGNOSIS — T887XXA Unspecified adverse effect of drug or medicament, initial encounter: Secondary | ICD-10-CM

## 2013-09-08 DIAGNOSIS — I10 Essential (primary) hypertension: Secondary | ICD-10-CM | POA: Diagnosis not present

## 2013-09-08 DIAGNOSIS — S40029A Contusion of unspecified upper arm, initial encounter: Secondary | ICD-10-CM | POA: Diagnosis not present

## 2013-09-08 DIAGNOSIS — F329 Major depressive disorder, single episode, unspecified: Secondary | ICD-10-CM | POA: Diagnosis not present

## 2013-09-08 DIAGNOSIS — S40019A Contusion of unspecified shoulder, initial encounter: Secondary | ICD-10-CM | POA: Diagnosis not present

## 2013-09-08 DIAGNOSIS — S42309D Unspecified fracture of shaft of humerus, unspecified arm, subsequent encounter for fracture with routine healing: Secondary | ICD-10-CM | POA: Diagnosis not present

## 2013-09-08 LAB — ALT: ALT: 20 U/L (ref 0–35)

## 2013-09-08 LAB — BASIC METABOLIC PANEL
BUN: 13 mg/dL (ref 6–23)
CO2: 28 mEq/L (ref 19–32)
Calcium: 9.4 mg/dL (ref 8.4–10.5)
Chloride: 105 mEq/L (ref 96–112)
Creatinine, Ser: 0.8 mg/dL (ref 0.4–1.2)
GFR: 81.11 mL/min (ref >60.00)
Glucose, Bld: 84 mg/dL (ref 70–99)
Potassium: 3.7 mEq/L (ref 3.5–5.1)
Sodium: 138 mEq/L (ref 135–145)

## 2013-09-08 LAB — HEPATIC FUNCTION PANEL
ALT: 21 U/L (ref 0–35)
AST: 27 U/L (ref 0–37)
Albumin: 4.2 g/dL (ref 3.5–5.2)
Alkaline Phosphatase: 76 U/L (ref 39–117)
Bilirubin, Direct: 0.1 mg/dL (ref 0.0–0.3)
Total Bilirubin: 0.7 mg/dL (ref 0.3–1.2)
Total Protein: 7.1 g/dL (ref 6.0–8.3)

## 2013-09-08 LAB — URIC ACID: Uric Acid, Serum: 4.3 mg/dL (ref 2.4–7.0)

## 2013-09-08 LAB — AST: AST: 25 U/L (ref 0–37)

## 2013-09-08 LAB — HEMOGLOBIN A1C: Hgb A1c MFr Bld: 5 % (ref 4.6–6.5)

## 2013-09-09 ENCOUNTER — Encounter: Payer: Self-pay | Admitting: Internal Medicine

## 2013-09-09 ENCOUNTER — Ambulatory Visit (INDEPENDENT_AMBULATORY_CARE_PROVIDER_SITE_OTHER): Payer: Medicare Other | Admitting: Internal Medicine

## 2013-09-09 VITALS — BP 108/65 | HR 74 | Temp 98.1°F | Wt 175.8 lb

## 2013-09-09 DIAGNOSIS — I951 Orthostatic hypotension: Secondary | ICD-10-CM

## 2013-09-09 DIAGNOSIS — F329 Major depressive disorder, single episode, unspecified: Secondary | ICD-10-CM | POA: Diagnosis not present

## 2013-09-09 DIAGNOSIS — R269 Unspecified abnormalities of gait and mobility: Secondary | ICD-10-CM | POA: Diagnosis not present

## 2013-09-09 DIAGNOSIS — S40019A Contusion of unspecified shoulder, initial encounter: Secondary | ICD-10-CM | POA: Diagnosis not present

## 2013-09-09 DIAGNOSIS — R609 Edema, unspecified: Secondary | ICD-10-CM | POA: Diagnosis not present

## 2013-09-09 DIAGNOSIS — S40029A Contusion of unspecified upper arm, initial encounter: Secondary | ICD-10-CM | POA: Diagnosis not present

## 2013-09-09 DIAGNOSIS — S42309D Unspecified fracture of shaft of humerus, unspecified arm, subsequent encounter for fracture with routine healing: Secondary | ICD-10-CM | POA: Diagnosis not present

## 2013-09-09 DIAGNOSIS — I10 Essential (primary) hypertension: Secondary | ICD-10-CM | POA: Diagnosis not present

## 2013-09-09 NOTE — Telephone Encounter (Signed)
Pt was seen today for OV with Dr. Lavell Islam

## 2013-09-09 NOTE — Progress Notes (Signed)
  Subjective:    Patient ID: Lisa Larson, female    DOB: 1941-05-03, 72 y.o.   MRN: [id number]  HPI   She has had edema for approximately 3 weeks. There was no specific trigger such as increased salt intake, immobilization, or prolonged travel. Blood pressures have been monitored by home health nurses.  Labs 09/08/13 were reviewed. She has normal hepatorenal function.  Significantly she is on amlodipine 10 mg daily; she's also on chronic prednisone    Review of Systems  Home blood pressure  Averages < 135/85 Patient is compliant with medications Exercise program as exercises taught by Physical Therapy daily ; seen by PT 3 times per week  On low-fat low-salt diet  No chest pain, palpitations, dyspnea, claudication or paroxysmal nocturnal dyspnea described Significant positional lightheadedness. No significant headache, epistaxis, or syncope   She fractured her left shoulder 08/14/13 in a fall. This occurred at approximately 10 PM. It was in the context of some alcohol ingestion  She denied any cardiac or neurologic prodrome prior to the fall. Specifically she denied any change in heart rhythm or rate; headache;  limb numbness, tingling, or weakness          Objective:   Physical Exam Appears adequately nourished & in no acute distress  No carotid bruits are present.Thyroid normal to palpation  Heart rhythm and rate are normal with no significant murmurs or gallops.  Chest is clear with no increased work of breathing  There is no evidence of aortic aneurysm or renal artery bruits  Abdomen soft with no organomegaly or masses. Dullness to percussion in the upper quadrants  No clubbing or cyanosis . One half-1+ edema. Pedal pulses are intact   No ischemic skin changes are present . Nails  Healthy   Alert and oriented.   Left upper extremity in a sling. She has this mixed arthritic changes in hands, and mainly DIP joints, osteoarthritic in nature       Assessment  & Plan:  #1 edema; this may be related to the amlodipine and the prednisone. No hepatorenal insufficiency  #2 hypertension, well-controlled  Plan: See orders and recommendations.

## 2013-09-09 NOTE — Patient Instructions (Addendum)
Please review the medication list in the After Visit Summary provided.Please verify the medication name (this may be  brand or generic) & correct dosage. Write the name of the prescribing physician to the right of the medication and share this with all medical staff seen at each appointment. This will help provide continuity of care; help optimize therapeutic interventions;and help prevent drug:drug adverse reaction.  Decrease amlodipine 10 mg one half pill daily until your present supply is completed. Do not resume the amlodipine and monitor your blood pressure off this medication. This medication can cause significant edema.  Minimal Blood Pressure Goal= AVERAGE < 140/90;  Ideal is an AVERAGE < 135/85. This AVERAGE should be calculated from @ least 5-7 BP readings taken @ different times of day on different days of week. You should not respond to isolated BP readings , but rather the AVERAGE for that week .Please bring your  blood pressure cuff to office visits to verify that it is reliable.It  can also be checked against the blood pressure device at the pharmacy. Finger or wrist cuffs are not dependable; an arm cuff is.  Repeat the isometric exercises discussed 4- 5 times prior to standing if you've been seated for a period of time.

## 2013-09-12 DIAGNOSIS — S40029A Contusion of unspecified upper arm, initial encounter: Secondary | ICD-10-CM | POA: Diagnosis not present

## 2013-09-12 DIAGNOSIS — I10 Essential (primary) hypertension: Secondary | ICD-10-CM | POA: Diagnosis not present

## 2013-09-12 DIAGNOSIS — R269 Unspecified abnormalities of gait and mobility: Secondary | ICD-10-CM | POA: Diagnosis not present

## 2013-09-12 DIAGNOSIS — S42309D Unspecified fracture of shaft of humerus, unspecified arm, subsequent encounter for fracture with routine healing: Secondary | ICD-10-CM | POA: Diagnosis not present

## 2013-09-12 DIAGNOSIS — S40019A Contusion of unspecified shoulder, initial encounter: Secondary | ICD-10-CM | POA: Diagnosis not present

## 2013-09-12 DIAGNOSIS — Z79899 Other long term (current) drug therapy: Secondary | ICD-10-CM | POA: Diagnosis not present

## 2013-09-12 DIAGNOSIS — F329 Major depressive disorder, single episode, unspecified: Secondary | ICD-10-CM | POA: Diagnosis not present

## 2013-09-13 ENCOUNTER — Ambulatory Visit (INDEPENDENT_AMBULATORY_CARE_PROVIDER_SITE_OTHER): Payer: Medicare Other | Admitting: Internal Medicine

## 2013-09-13 ENCOUNTER — Other Ambulatory Visit: Payer: Self-pay | Admitting: Internal Medicine

## 2013-09-13 ENCOUNTER — Ambulatory Visit (INDEPENDENT_AMBULATORY_CARE_PROVIDER_SITE_OTHER)
Admission: RE | Admit: 2013-09-13 | Discharge: 2013-09-13 | Disposition: A | Discharge status: Home / Self Care | Payer: Medicare Other | Source: Ambulatory Visit | Attending: Internal Medicine | Admitting: Internal Medicine

## 2013-09-13 ENCOUNTER — Other Ambulatory Visit (INDEPENDENT_AMBULATORY_CARE_PROVIDER_SITE_OTHER): Payer: Medicare Other

## 2013-09-13 ENCOUNTER — Encounter: Payer: Self-pay | Admitting: Internal Medicine

## 2013-09-13 VITALS — BP 116/60 | HR 69 | Temp 97.1°F | Ht 61.0 in

## 2013-09-13 DIAGNOSIS — S40029A Contusion of unspecified upper arm, initial encounter: Secondary | ICD-10-CM | POA: Diagnosis not present

## 2013-09-13 DIAGNOSIS — S42309D Unspecified fracture of shaft of humerus, unspecified arm, subsequent encounter for fracture with routine healing: Secondary | ICD-10-CM | POA: Diagnosis not present

## 2013-09-13 DIAGNOSIS — F329 Major depressive disorder, single episode, unspecified: Secondary | ICD-10-CM | POA: Diagnosis not present

## 2013-09-13 DIAGNOSIS — I10 Essential (primary) hypertension: Secondary | ICD-10-CM | POA: Diagnosis not present

## 2013-09-13 DIAGNOSIS — J45909 Unspecified asthma, uncomplicated: Secondary | ICD-10-CM | POA: Diagnosis not present

## 2013-09-13 DIAGNOSIS — J4540 Moderate persistent asthma, uncomplicated: Secondary | ICD-10-CM

## 2013-09-13 DIAGNOSIS — J8289 Other pulmonary eosinophilia, not elsewhere classified: Secondary | ICD-10-CM | POA: Diagnosis not present

## 2013-09-13 DIAGNOSIS — S40019A Contusion of unspecified shoulder, initial encounter: Secondary | ICD-10-CM | POA: Diagnosis not present

## 2013-09-13 DIAGNOSIS — R269 Unspecified abnormalities of gait and mobility: Secondary | ICD-10-CM | POA: Diagnosis not present

## 2013-09-13 LAB — COMPREHENSIVE METABOLIC PANEL
ALT: 20 U/L (ref 0–35)
AST: 23 U/L (ref 0–37)
Albumin: 3.9 g/dL (ref 3.5–5.2)
Alkaline Phosphatase: 70 U/L (ref 39–117)
BUN: 14 mg/dL (ref 6–23)
CO2: 28 mEq/L (ref 19–32)
Calcium: 9.1 mg/dL (ref 8.4–10.5)
Chloride: 104 mEq/L (ref 96–112)
Creatinine, Ser: 0.8 mg/dL (ref 0.4–1.2)
GFR: 77.52 mL/min (ref >60.00)
Glucose, Bld: 97 mg/dL (ref 70–99)
Potassium: 4 mEq/L (ref 3.5–5.1)
Sodium: 137 mEq/L (ref 135–145)
Total Bilirubin: 0.5 mg/dL (ref 0.3–1.2)
Total Protein: 6.8 g/dL (ref 6.0–8.3)

## 2013-09-13 LAB — CBC WITH DIFFERENTIAL/PLATELET
Basophils Absolute: 0 10*3/uL (ref 0.0–0.1)
Basophils Relative: 0 % (ref 0.0–3.0)
Eosinophils Absolute: 0.2 10*3/uL (ref 0.0–0.7)
Eosinophils Relative: 2.5 % (ref 0.0–5.0)
HCT: 33.8 % — ABNORMAL LOW (ref 36.0–46.0)
Hemoglobin: 11.3 g/dL — ABNORMAL LOW (ref 12.0–15.0)
Lymphocytes Relative: 6.5 % — ABNORMAL LOW (ref 12.0–46.0)
Lymphs Abs: 0.6 10*3/uL — ABNORMAL LOW (ref 0.7–4.0)
MCHC: 33.5 g/dL (ref 30.0–36.0)
MCV: 95.6 fl (ref 78.0–100.0)
Monocytes Absolute: 0.5 10*3/uL (ref 0.1–1.0)
Monocytes Relative: 5.7 % (ref 3.0–12.0)
Neutro Abs: 7.5 10*3/uL (ref 1.4–7.7)
Neutrophils Relative %: 85.3 % — ABNORMAL HIGH (ref 43.0–77.0)
Platelets: 198 10*3/uL (ref 150.0–400.0)
RBC: 3.54 Mil/uL — ABNORMAL LOW (ref 3.87–5.11)
RDW: 14.4 % (ref 11.5–14.6)
WBC: 8.8 10*3/uL (ref 4.5–10.5)

## 2013-09-13 MED ORDER — PREDNISONE 5 MG PO TABS: 5.0000 mg | ORAL_TABLET | Freq: Every day | ORAL | Status: DC

## 2013-09-13 NOTE — Progress Notes (Signed)
Chief Complaint  Patient presents with  . Follow-up    Dr. Craige Cotta pt-- CXT/labs done--reports breathing is doing well, chest pain,leg swelling at times in center of chest-- concerned about adjusting prednisone dosage--denies any other concerns at this time    History of Present Illness: Lisa Larson is a 72 y.o. 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  She gets occasional hoarseness and cough.  She is not having sputum, fever, chest pain, wheeze, hemoptysis, skin rash, joint pain, or swelling.  She uses albuterol about once per week.  She has to use nasonex about twice per week.  Tests: CBC 01/14/11>>39% eosinophils 01/14/11>> HIV negative, ACE 39, RF negative, ANA 1:40, ANCA negative  CT chest 01/16/11>>Multifocal b/l ASD with GGO BAL 01/21/11>>48% Eosinophils  IgE 02/25/11>>282 CT chest 07/22/11>>resolution of ASD CT chest 10/03/11>>recurrence of nodular infiltrate Rt upper lobe 10/08/11>>resume prednisone 10/08/11 CXR 08/19/12>>no acute findings PFT 11/04/12>>FEV1 1.77 (87%), FEV1% 68, TLC 4.87 (101%), DLCO 69%, +BD from FEF 25-75%. CXR 02/21/13 >> Rt lung scarring, no change   09/13/2013 f/u ov/Mateja Dier re: eos pna on pred @ 7.5 mg per day  Chief Complaint  Patient presents with  . Follow-up    Dr. Craige Cotta pt-- CXT/labs done--reports breathing is doing well, chest pain,leg swelling at times in center of chest-- concerned about adjusting prednisone dosage--denies any other concerns at this time  no ex or pleuritic cp and not limited by sob  No obvious daytime variabilty or assoc chronic cough, chest tightness, subjective wheeze overt sinus or hb symptoms. No unusual exp hx or h/o childhood pna/ asthma or knowledge of premature birth.   Sleeping ok without nocturnal  or early am exacerbation  of respiratory  c/o's or need for noct saba. Also denies any obvious fluctuation of symptoms with weather or environmental changes or other aggravating or alleviating factors except  as outlined above    She  has a past medical history of Eosinophilic pneumonia (January 2012); Asthma; Hypertension; Osteopenia; GERD (gastroesophageal reflux disease); Depression; Hyperlipidemia; Anxiety; Baker's cyst, ruptured (2012); Arthritis; Chronic headaches; COPD (chronic obstructive pulmonary disease); Status post dilation of esophageal narrowing; Lipoma; and Shoulder fracture, left (08/14/2013).  She  has past surgical history that includes Shoulder surgery (Left, 08-2008); Total abdominal hysterectomy; Tonsillectomy and adenoidectomy; Colonoscopy; Nasal sinus surgery; Appendectomy; Esophageal dilation; Bronchoscopy (12-2010); and Cataract extraction w/ intraocular lens  implant, bilateral (Bilateral).   Current Outpatient Prescriptions on File Prior to Visit  Medication Sig Dispense Refill  . albuterol (PROAIR HFA) 108 (90 BASE) MCG/ACT inhaler Inhale 2 puffs into the lungs every 6 (six) hours as needed.  18 g  11  . calcium carbonate (OS-CAL) 600 MG TABS tablet Take 600 mg by mouth 2 (two) times daily with a meal.      . Cetirizine HCl 10 MG CAPS Take 10 mg by mouth daily.      . Cholecalciferol (VITAMIN D3) 2000 UNITS capsule Take 2,000 Units by mouth daily.      Marland Kitchen esomeprazole (NEXIUM) 40 MG capsule Take 1 capsule (40 mg total) by mouth daily before breakfast.  30 capsule  5  . HYDROcodone-acetaminophen (NORCO/VICODIN) 5-325 MG per tablet Take 1 tablet by mouth every 6 (six) hours as needed for pain.      Marland Kitchen LORazepam (ATIVAN) 1 MG tablet TAKE 1 TABLET BY MOUTH EVERY 8 HOURS AS NEEDED (DO NOT TAKE WITHIN 8-12 HOURS OF ALCOHOL)  30 tablet  0  . losartan (COZAAR) 100 MG tablet TAKE 1 TABLET BY  MOUTH EVERY DAY  90 tablet  3  . mometasone-formoterol (DULERA) 100-5 MCG/ACT AERO Inhale 2 puffs into the lungs 2 (two) times daily.  2 Inhaler  0  . ondansetron (ZOFRAN) 4 MG tablet Take 1 tablet (4 mg total) by mouth every 8 (eight) hours as needed for nausea.  20 tablet  1  . predniSONE  (DELTASONE) 2.5 MG tablet Take 1 tablet (2.5 mg total) by mouth daily.  30 tablet  3  . predniSONE (DELTASONE) 5 MG tablet Take 1 tablet (5 mg total) by mouth daily.  30 tablet  3  . sertraline (ZOLOFT) 100 MG tablet TAKE 1 TABLET BY MOUTH EVERY DAY  90 tablet  3  . thiamine (VITAMIN B-1) 100 MG tablet Take 50 mg by mouth daily.        No current facility-administered medications on file prior to visit.    Allergies  Allergen Reactions  . Sulfonamide Derivatives     Rash; dyspnea  . Benazepril Hcl     No PMH of angioedema; ACE-I caused cough  . Diclofenac   . Oxycodone-Aspirin   . Rofecoxib     Physical Exam:  General - obese HEENT - no sinus tenderness, no oral exudate, no LAN Chest - no wheeze/rales/dullness Cardiac - s1s2 regular, no murmur Abd - soft, nontender Ext - no edema Neuro - normal strength Skin - no rash Psych - normal mood, behavior  CXR  09/13/2013 :  No acute cardiopulmonary disease. Stable appearance from the prior exam.     CBC    Component Value Date/Time   WBC 8.8 09/13/2013 1156   RBC 3.54* 09/13/2013 1156   HGB 11.3* 09/13/2013 1156   HCT 33.8* 09/13/2013 1156   PLT 198.0 09/13/2013 1156   MCV 95.6 09/13/2013 1156   MCHC 33.5 09/13/2013 1156   RDW 14.4 09/13/2013 1156   LYMPHSABS 0.6* 09/13/2013 1156   MONOABS 0.5 09/13/2013 1156   EOSABS 0.2 09/13/2013 1156   BASOSABS 0.0 09/13/2013 1156     No results found.    Assessment/Plan:

## 2013-09-13 NOTE — Patient Instructions (Addendum)
Reduce the prednisone to 5 mg daily   Please schedule a follow up office visit in 6 weeks, call sooner if needed with Dr Craige Cotta

## 2013-09-14 DIAGNOSIS — R269 Unspecified abnormalities of gait and mobility: Secondary | ICD-10-CM | POA: Diagnosis not present

## 2013-09-14 DIAGNOSIS — S42309D Unspecified fracture of shaft of humerus, unspecified arm, subsequent encounter for fracture with routine healing: Secondary | ICD-10-CM | POA: Diagnosis not present

## 2013-09-14 DIAGNOSIS — F329 Major depressive disorder, single episode, unspecified: Secondary | ICD-10-CM | POA: Diagnosis not present

## 2013-09-14 DIAGNOSIS — S40019A Contusion of unspecified shoulder, initial encounter: Secondary | ICD-10-CM | POA: Diagnosis not present

## 2013-09-14 DIAGNOSIS — I10 Essential (primary) hypertension: Secondary | ICD-10-CM | POA: Diagnosis not present

## 2013-09-14 DIAGNOSIS — S40029A Contusion of unspecified upper arm, initial encounter: Secondary | ICD-10-CM | POA: Diagnosis not present

## 2013-09-14 NOTE — Assessment & Plan Note (Signed)
Adequate control on present rx, reviewed > no change in rx needed   

## 2013-09-14 NOTE — Progress Notes (Signed)
Quick Note:  Informed pt of labs and she verbalized understanding and stated she would call back if had further questions ______

## 2013-09-14 NOTE — Assessment & Plan Note (Addendum)
The goal with a chronic steroid dependent illness is always arriving at the lowest effective dose that controls the disease/symptoms and not accepting a set "formula" which is based on statistics or guidelines that don't always take into account patient  variability or the natural hx of the dz in every individual patient, which may well vary over time.  For now therefore I recommend the patient maintain  5 mg daily until next ov with Dr Craige Cotta.

## 2013-09-15 DIAGNOSIS — S40029A Contusion of unspecified upper arm, initial encounter: Secondary | ICD-10-CM | POA: Diagnosis not present

## 2013-09-15 DIAGNOSIS — I10 Essential (primary) hypertension: Secondary | ICD-10-CM | POA: Diagnosis not present

## 2013-09-15 DIAGNOSIS — S42309D Unspecified fracture of shaft of humerus, unspecified arm, subsequent encounter for fracture with routine healing: Secondary | ICD-10-CM | POA: Diagnosis not present

## 2013-09-15 DIAGNOSIS — R269 Unspecified abnormalities of gait and mobility: Secondary | ICD-10-CM | POA: Diagnosis not present

## 2013-09-15 DIAGNOSIS — S40019A Contusion of unspecified shoulder, initial encounter: Secondary | ICD-10-CM | POA: Diagnosis not present

## 2013-09-15 DIAGNOSIS — F329 Major depressive disorder, single episode, unspecified: Secondary | ICD-10-CM | POA: Diagnosis not present

## 2013-09-16 DIAGNOSIS — F329 Major depressive disorder, single episode, unspecified: Secondary | ICD-10-CM | POA: Diagnosis not present

## 2013-09-16 DIAGNOSIS — S42309D Unspecified fracture of shaft of humerus, unspecified arm, subsequent encounter for fracture with routine healing: Secondary | ICD-10-CM | POA: Diagnosis not present

## 2013-09-16 DIAGNOSIS — S40029A Contusion of unspecified upper arm, initial encounter: Secondary | ICD-10-CM | POA: Diagnosis not present

## 2013-09-16 DIAGNOSIS — I10 Essential (primary) hypertension: Secondary | ICD-10-CM | POA: Diagnosis not present

## 2013-09-16 DIAGNOSIS — R269 Unspecified abnormalities of gait and mobility: Secondary | ICD-10-CM | POA: Diagnosis not present

## 2013-09-16 DIAGNOSIS — S40019A Contusion of unspecified shoulder, initial encounter: Secondary | ICD-10-CM | POA: Diagnosis not present

## 2013-09-21 ENCOUNTER — Ambulatory Visit
Admission: RE | Admit: 2013-09-21 | Discharge: 2013-09-21 | Disposition: A | Discharge status: Home / Self Care | Payer: Medicare Other | Source: Ambulatory Visit | Attending: Orthopedic Surgery | Admitting: Orthopedic Surgery

## 2013-09-21 ENCOUNTER — Other Ambulatory Visit: Payer: Self-pay | Admitting: Orthopedic Surgery

## 2013-09-21 ENCOUNTER — Other Ambulatory Visit: Payer: Self-pay | Admitting: Internal Medicine

## 2013-09-21 DIAGNOSIS — M1710 Unilateral primary osteoarthritis, unspecified knee: Secondary | ICD-10-CM | POA: Diagnosis not present

## 2013-09-21 DIAGNOSIS — M79605 Pain in left leg: Secondary | ICD-10-CM

## 2013-09-21 DIAGNOSIS — M79609 Pain in unspecified limb: Secondary | ICD-10-CM | POA: Diagnosis not present

## 2013-09-21 DIAGNOSIS — M7989 Other specified soft tissue disorders: Secondary | ICD-10-CM | POA: Diagnosis not present

## 2013-09-21 DIAGNOSIS — R609 Edema, unspecified: Secondary | ICD-10-CM

## 2013-09-21 DIAGNOSIS — S42209A Unspecified fracture of upper end of unspecified humerus, initial encounter for closed fracture: Secondary | ICD-10-CM | POA: Diagnosis not present

## 2013-09-21 NOTE — Telephone Encounter (Signed)
rx refill- Ativan Last OV- 09/09/13 Last refilled- 08/26/13 #30 / 0 rf  UDS moderate risk- 03/11/13  Please advise- DJR

## 2013-09-26 ENCOUNTER — Telehealth: Payer: Self-pay | Admitting: *Deleted

## 2013-09-26 NOTE — Telephone Encounter (Signed)
Patient called requesting lab results from 09/08/2013. Copy mailed to patient She is also requesting a refill on LORazepam (ATIVAN) 1 MG tablet Last OV: 09/09/2013 Last refill: 08/26/2013 UDS: 03/11/2013

## 2013-09-26 NOTE — Telephone Encounter (Signed)
OK X 1 .The medication is " as needed" and should not be taken on a regular basis. It should be taken :" one half to one every 8 hours as needed only". Regular use can increase risk of addiction but more importantly affect level of alertness and balance with increased risk of falling. All labs 9/11 were completely normal

## 2013-09-27 ENCOUNTER — Other Ambulatory Visit: Payer: Self-pay | Admitting: Internal Medicine

## 2013-09-27 DIAGNOSIS — M25519 Pain in unspecified shoulder: Secondary | ICD-10-CM | POA: Diagnosis not present

## 2013-09-27 MED ORDER — LORAZEPAM 1 MG PO TABS: ORAL_TABLET | ORAL | Status: DC

## 2013-09-27 NOTE — Telephone Encounter (Signed)
Spoke with the pt and informed her of Dr. Frederik Pear note below.  Pt understood and agreed.  Rx printed and faxed to the pharmacy.//AB/CMA

## 2013-09-29 DIAGNOSIS — M25519 Pain in unspecified shoulder: Secondary | ICD-10-CM | POA: Diagnosis not present

## 2013-10-05 DIAGNOSIS — M25519 Pain in unspecified shoulder: Secondary | ICD-10-CM | POA: Diagnosis not present

## 2013-10-06 DIAGNOSIS — S42209A Unspecified fracture of upper end of unspecified humerus, initial encounter for closed fracture: Secondary | ICD-10-CM | POA: Diagnosis not present

## 2013-10-11 DIAGNOSIS — M25519 Pain in unspecified shoulder: Secondary | ICD-10-CM | POA: Diagnosis not present

## 2013-10-12 ENCOUNTER — Ambulatory Visit (INDEPENDENT_AMBULATORY_CARE_PROVIDER_SITE_OTHER): Payer: Medicare Other | Admitting: Internal Medicine

## 2013-10-12 ENCOUNTER — Encounter: Payer: Self-pay | Admitting: Internal Medicine

## 2013-10-12 VITALS — BP 120/73 | HR 68 | Temp 98.4°F | Wt 163.4 lb

## 2013-10-12 DIAGNOSIS — Z23 Encounter for immunization: Secondary | ICD-10-CM

## 2013-10-12 DIAGNOSIS — R079 Chest pain, unspecified: Secondary | ICD-10-CM

## 2013-10-12 MED ORDER — ESOMEPRAZOLE MAGNESIUM 40 MG PO CPDR: 40.0000 mg | DELAYED_RELEASE_CAPSULE | Freq: Every day | ORAL | Status: DC

## 2013-10-12 NOTE — Patient Instructions (Signed)
Reflux of gastric acid may be asymptomatic as this may occur mainly during sleep.The triggers for reflux  include stress; the "aspirin family" ; alcohol; peppermint; and caffeine (coffee, tea, cola, and chocolate). The aspirin family would include aspirin and the nonsteroidal agents such as ibuprofen &  Naproxen. Tylenol would not cause reflux. If having symptoms ; food & drink should be avoided for @ least 2 hours before going to bed.  

## 2013-10-12 NOTE — Progress Notes (Signed)
  Subjective:    Patient ID: Lisa Larson, female    DOB: Oct 17, 1941, 72 y.o.   MRN: [id number]  HPI   She has had intermittent substernal chest pain over the last 3 months. The initial episode occurred in the middle of the night. It is described as aching ("a heavy heart"); lasting minutes; and nonradiating. There is no factors such as deep breathing; movement of the thorax; or exercise which make it worse.  She did have some discomfort in the left elbow area prompting her orthopedist to refer her for evaluation  There is also no associated nausea, sweating, shortness of breath, palpitations and chest discomfort.  She does not find it is related to any medications or foods. She had mild chest pain today.      Review of Systems  [employer] company had raised her co-pay on Nexium so she's been using the over-the-counter 20 mg dose with response.  At this time she denies dyspepsia, dysphagia, abdominal pain, unexplained weight loss, melena, or rectal bleeding.  There's been no associated cough, sputum production, or hemoptysis with the chest pain.  There's been no radicular character to the pain. Her Orthopedist x-rayed her shoulder to assess healing  The ankle edema has resolved off amlodipine  She has no paroxysmal nocturnal dyspnea or claudication. She also has had no syncope or significant dizziness  There's been no rash, color change or temperature change in the area of discomfort.     Objective:   Physical Exam Appears well-nourished & in no acute distress  No carotid bruits are present.No neck pain distention present at 10 - 15 degrees. Thyroid normal to palpation  Heart rhythm and rate are normal with no significant murmurs or gallops.  Chest is surprisingly clear with no increased work of breathing  There is no evidence of aortic aneurysm or renal artery bruits  Abdomen protuberant but soft with no organomegaly or masses. No HJR  No clubbing, cyanosis or  significant edema present. Homan's negative  Pedal pulses are intact   No ischemic skin changes are present .    She is using a cane for ambulation. She requires help onto the exam table and help both reclining and sitting up. She has decreased range of motion of the left upper extremity. Tone and strength are generally decreased            Assessment & Plan:  #1 atypical chest pain See orders

## 2013-10-13 LAB — TROPONIN I: Troponin I: <0.01 ng/mL (ref <0.06)

## 2013-10-18 DIAGNOSIS — M25519 Pain in unspecified shoulder: Secondary | ICD-10-CM | POA: Diagnosis not present

## 2013-10-21 DIAGNOSIS — M25519 Pain in unspecified shoulder: Secondary | ICD-10-CM | POA: Diagnosis not present

## 2013-10-25 DIAGNOSIS — M25519 Pain in unspecified shoulder: Secondary | ICD-10-CM | POA: Diagnosis not present

## 2013-10-27 ENCOUNTER — Ambulatory Visit (INDEPENDENT_AMBULATORY_CARE_PROVIDER_SITE_OTHER): Payer: Medicare Other | Admitting: Internal Medicine

## 2013-10-27 ENCOUNTER — Encounter: Payer: Self-pay | Admitting: Internal Medicine

## 2013-10-27 VITALS — BP 130/79 | HR 68 | Temp 98.2°F | Wt 162.6 lb

## 2013-10-27 DIAGNOSIS — H6120 Impacted cerumen, unspecified ear: Secondary | ICD-10-CM

## 2013-10-27 DIAGNOSIS — H833X9 Noise effects on inner ear, unspecified ear: Secondary | ICD-10-CM | POA: Diagnosis not present

## 2013-10-27 DIAGNOSIS — H9319 Tinnitus, unspecified ear: Secondary | ICD-10-CM

## 2013-10-27 DIAGNOSIS — H6123 Impacted cerumen, bilateral: Secondary | ICD-10-CM

## 2013-10-27 DIAGNOSIS — H833X3 Noise effects on inner ear, bilateral: Secondary | ICD-10-CM

## 2013-10-27 DIAGNOSIS — H9313 Tinnitus, bilateral: Secondary | ICD-10-CM

## 2013-10-27 NOTE — Patient Instructions (Signed)
Your next office appointment will be determined based upon review of your Audiology evaluation

## 2013-10-27 NOTE — Progress Notes (Signed)
  Subjective:    Patient ID: Lisa Larson, female    DOB: May 15, 1941, 72 y.o.   MRN: [id number]  HPI  She's had some bilateral hearing loss associated with some tinnitus over the last 6 months. Her son is very concerned about her hearing loss    Review of Systems  She specifically denies blurred vision, double vision, or loss of vision.  She's had no frontal headache, facial pain, or nasal purulence  Also absent of any fever, chills, or sweats.     Objective:   Physical Exam General appearance:good health ;well nourished; no acute distress or increased work of breathing is present.  No  lymphadenopathy about the head, neck, or axilla noted.   Eyes: No conjunctival inflammation or lid edema is present. There is no scleral icterus.  Ears:  External ear exam shows no significant lesions or deformities.  Otoscopic examination revealed wax bilaterally. Hearing decreased bilaterally Nose:  External nasal examination shows no deformity or inflammation. Nasal mucosa are pink and moist without lesions or exudates. No septal dislocation or deviation.No obstruction to airflow.   Oral exam: Dental hygiene is good; lips and gums are healthy appearing.There is no oropharyngeal erythema or exudate noted.   Neck:  No deformities,  masses, or tenderness noted.   Supple with decreased range of motion without pain.   Skin: Warm & dry w/o jaundice or tenting.         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 Whisper heard @ 6 feet post procedure. Audiology evaluation

## 2013-10-28 DIAGNOSIS — M25519 Pain in unspecified shoulder: Secondary | ICD-10-CM | POA: Diagnosis not present

## 2013-11-01 DIAGNOSIS — H9190 Unspecified hearing loss, unspecified ear: Secondary | ICD-10-CM | POA: Diagnosis not present

## 2013-11-01 DIAGNOSIS — H903 Sensorineural hearing loss, bilateral: Secondary | ICD-10-CM | POA: Diagnosis not present

## 2013-11-01 DIAGNOSIS — H9319 Tinnitus, unspecified ear: Secondary | ICD-10-CM | POA: Diagnosis not present

## 2013-11-04 DIAGNOSIS — M25519 Pain in unspecified shoulder: Secondary | ICD-10-CM | POA: Diagnosis not present

## 2013-11-08 DIAGNOSIS — M25519 Pain in unspecified shoulder: Secondary | ICD-10-CM | POA: Diagnosis not present

## 2013-11-15 DIAGNOSIS — M25519 Pain in unspecified shoulder: Secondary | ICD-10-CM | POA: Diagnosis not present

## 2013-11-18 DIAGNOSIS — M25519 Pain in unspecified shoulder: Secondary | ICD-10-CM | POA: Diagnosis not present

## 2013-11-22 DIAGNOSIS — M25519 Pain in unspecified shoulder: Secondary | ICD-10-CM | POA: Diagnosis not present

## 2013-11-30 ENCOUNTER — Telehealth: Payer: Self-pay | Admitting: *Deleted

## 2013-11-30 ENCOUNTER — Encounter: Payer: Self-pay | Admitting: Internal Medicine

## 2013-11-30 ENCOUNTER — Other Ambulatory Visit: Payer: Self-pay | Admitting: *Deleted

## 2013-11-30 MED ORDER — LORAZEPAM 1 MG PO TABS: ORAL_TABLET | ORAL | Status: DC

## 2013-11-30 NOTE — Telephone Encounter (Signed)
LORazepam (ATIVAN) 1 MG tablet Last refill: 09/27/13 #30, 0 refills Last OV: 10/27/13 UDS due, moderate risk

## 2013-11-30 NOTE — Telephone Encounter (Signed)
OK X1 

## 2013-12-03 ENCOUNTER — Other Ambulatory Visit: Payer: Self-pay | Admitting: Internal Medicine

## 2013-12-05 ENCOUNTER — Ambulatory Visit (INDEPENDENT_AMBULATORY_CARE_PROVIDER_SITE_OTHER): Payer: Medicare Other | Admitting: Pulmonary Disease

## 2013-12-05 ENCOUNTER — Telehealth: Payer: Self-pay | Admitting: Pulmonary Disease

## 2013-12-05 ENCOUNTER — Encounter: Payer: Self-pay | Admitting: Pulmonary Disease

## 2013-12-05 ENCOUNTER — Other Ambulatory Visit (INDEPENDENT_AMBULATORY_CARE_PROVIDER_SITE_OTHER): Payer: Medicare Other

## 2013-12-05 ENCOUNTER — Ambulatory Visit (INDEPENDENT_AMBULATORY_CARE_PROVIDER_SITE_OTHER)
Admission: RE | Admit: 2013-12-05 | Discharge: 2013-12-05 | Disposition: A | Discharge status: Home / Self Care | Payer: Medicare Other | Source: Ambulatory Visit | Attending: Pulmonary Disease | Admitting: Pulmonary Disease

## 2013-12-05 VITALS — BP 122/80 | HR 76 | Temp 98.1°F | Ht 61.0 in | Wt 161.0 lb

## 2013-12-05 DIAGNOSIS — J45909 Unspecified asthma, uncomplicated: Secondary | ICD-10-CM

## 2013-12-05 DIAGNOSIS — J8289 Other pulmonary eosinophilia, not elsewhere classified: Secondary | ICD-10-CM | POA: Diagnosis not present

## 2013-12-05 DIAGNOSIS — J984 Other disorders of lung: Secondary | ICD-10-CM | POA: Diagnosis not present

## 2013-12-05 DIAGNOSIS — J4540 Moderate persistent asthma, uncomplicated: Secondary | ICD-10-CM

## 2013-12-05 DIAGNOSIS — J45998 Other asthma: Secondary | ICD-10-CM

## 2013-12-05 LAB — CBC WITH DIFFERENTIAL/PLATELET
Basophils Absolute: 0 10*3/uL (ref 0.0–0.1)
Basophils Relative: 0.3 % (ref 0.0–3.0)
Eosinophils Absolute: 0.6 10*3/uL (ref 0.0–0.7)
Eosinophils Relative: 7.9 % — ABNORMAL HIGH (ref 0.0–5.0)
HCT: 37.4 % (ref 36.0–46.0)
Hemoglobin: 12.9 g/dL (ref 12.0–15.0)
Lymphocytes Relative: 24.3 % (ref 12.0–46.0)
Lymphs Abs: 1.9 10*3/uL (ref 0.7–4.0)
MCHC: 34.3 g/dL (ref 30.0–36.0)
MCV: 87.7 fl (ref 78.0–100.0)
Monocytes Absolute: 0.8 10*3/uL (ref 0.1–1.0)
Monocytes Relative: 10.1 % (ref 3.0–12.0)
Neutro Abs: 4.4 10*3/uL (ref 1.4–7.7)
Neutrophils Relative %: 57.4 % (ref 43.0–77.0)
Platelets: 165 10*3/uL (ref 150.0–400.0)
RBC: 4.27 Mil/uL (ref 3.87–5.11)
RDW: 16.3 % — ABNORMAL HIGH (ref 11.5–14.6)
WBC: 7.7 10*3/uL (ref 4.5–10.5)

## 2013-12-05 LAB — COMPREHENSIVE METABOLIC PANEL
ALT: 27 U/L (ref 0–35)
AST: 33 U/L (ref 0–37)
Albumin: 4.2 g/dL (ref 3.5–5.2)
Alkaline Phosphatase: 73 U/L (ref 39–117)
BUN: 12 mg/dL (ref 6–23)
CO2: 30 mEq/L (ref 19–32)
Calcium: 9.2 mg/dL (ref 8.4–10.5)
Chloride: 104 mEq/L (ref 96–112)
Creatinine, Ser: 0.8 mg/dL (ref 0.4–1.2)
GFR: 72.1 mL/min (ref >60.00)
Glucose, Bld: 76 mg/dL (ref 70–99)
Potassium: 3.7 mEq/L (ref 3.5–5.1)
Sodium: 140 mEq/L (ref 135–145)
Total Bilirubin: 0.9 mg/dL (ref 0.3–1.2)
Total Protein: 7.4 g/dL (ref 6.0–8.3)

## 2013-12-05 NOTE — Progress Notes (Signed)
Chief Complaint  Patient presents with  . Follow-up    Breathing is unchanged. Reports wheezing. Had labs and CXR done today.    History of Present Illness: Lisa Larson is a 72 y.o. 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  She has lots of stress at home.    She ran out of dulera a few weeks ago, and has noticed more wheezing.  She was feeling better prior to this.  She remains on 5 mg prednisone daily.  She denies chest pain, fever, rash, sputum, joint swelling, or hemoptysis.  Tests: CBC 01/14/11>>39% eosinophils 01/14/11>> HIV negative, ACE 39, RF negative, ANA 1:40, ANCA negative  CT chest 01/16/11>>Multifocal b/l ASD with GGO BAL 01/21/11>>48% Eosinophils  IgE 02/25/11>>282 CT chest 07/22/11>>resolution of ASD CT chest 10/03/11>>recurrence of nodular infiltrate Rt upper lobe 10/08/11>>resume prednisone 10/08/11 CXR 08/19/12>>no acute findings PFT 11/04/12>>FEV1 1.77 (87%), FEV1% 68, TLC 4.87 (101%), DLCO 69%, +BD from FEF 25-75%. 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  She  has past surgical history that includes Shoulder surgery (Left, 08-2008); Total abdominal hysterectomy; Tonsillectomy and adenoidectomy; Colonoscopy; Nasal sinus surgery; Appendectomy; Esophageal dilation; Bronchoscopy (12-2010); and Cataract extraction w/ intraocular lens  implant, bilateral (Bilateral).   Current Outpatient Prescriptions on File Prior to Visit  Medication Sig Dispense Refill  . albuterol (PROAIR HFA) 108 (90 BASE) MCG/ACT inhaler Inhale 2 puffs into the lungs every 6 (six) hours as  needed.  18 g  11  . calcium carbonate (OS-CAL) 600 MG TABS tablet Take 600 mg by mouth 2 (two) times daily with a meal.      . Cetirizine HCl 10 MG CAPS Take 10 mg by mouth daily.      . Cholecalciferol (VITAMIN D3) 2000 UNITS capsule Take 2,000 Units by mouth daily.      Marland Kitchen esomeprazole (NEXIUM) 40 MG capsule Take 1 capsule (40 mg total) by mouth daily before breakfast.  30 capsule  5  . HYDROcodone-acetaminophen (NORCO/VICODIN) 5-325 MG per tablet Take 1 tablet by mouth every 6 (six) hours as needed for pain.      Marland Kitchen LORazepam (ATIVAN) 1 MG tablet TAKE 1/2 TO 1 TABLET BY MOUTH EVERY 8 HOURS AS NEEDED ONLY. (DO NOT TAKE WITHIN 8-12 HOURS OF ALCOHOL.)  30 tablet  0  . losartan (COZAAR) 100 MG tablet TAKE 1 TABLET BY MOUTH EVERY DAY  90 tablet  3  . ondansetron (ZOFRAN) 4 MG tablet Take 1 tablet (4 mg total) by mouth every 8 (eight) hours as needed for nausea.  20 tablet  1  . predniSONE (DELTASONE) 5 MG tablet Take 1 tablet (5 mg total) by mouth daily.  100 tablet  0  . sertraline (ZOLOFT) 100 MG tablet TAKE 1 TABLET BY MOUTH EVERY DAY  90 tablet  3  . thiamine (VITAMIN B-1) 100 MG tablet Take 50 mg by mouth daily.       . mometasone-formoterol (DULERA) 100-5 MCG/ACT AERO Inhale 2 puffs into the lungs 2 (two) times daily.  2 Inhaler  0   No current facility-administered medications on file prior to visit.    Allergies  Allergen Reactions  . Sulfonamide Derivatives     Rash; dyspnea  . Benazepril Hcl  No PMH of angioedema; ACE-I caused cough  . Diclofenac   . Oxycodone-Aspirin   . Rofecoxib     Physical Exam:  General - obese HEENT - no sinus tenderness, no oral exudate, no LAN Chest - no wheeze/rales/dullness Cardiac - s1s2 regular, no murmur Abd - soft, nontender Ext - no edema Neuro - normal strength Skin - no rash Psych - normal mood, behavior    CBC    Component Value Date/Time   WBC 8.8 09/13/2013 1156   RBC 3.54* 09/13/2013 1156   HGB 11.3* 09/13/2013 1156   HCT  33.8* 09/13/2013 1156   PLT 198.0 09/13/2013 1156   MCV 95.6 09/13/2013 1156   MCHC 33.5 09/13/2013 1156   RDW 14.4 09/13/2013 1156   LYMPHSABS 0.6* 09/13/2013 1156   MONOABS 0.5 09/13/2013 1156   EOSABS 0.2 09/13/2013 1156   BASOSABS 0.0 09/13/2013 1156    12/05/2013    CLINICAL DATA:  Follow-up asthma with wheezing.  History of smoking.   EXAM: CHEST  2 VIEW   COMPARISON:  09/13/2013   FINDINGS:  Two views of the chest demonstrate a stable linear density in the right lower chest which may represent scarring. Stable lucency in the upper lungs suggest chronic changes. Heart and mediastinum are within normal limits. No focal airspace disease. Chronic endplate changes in the thoracic spine.   IMPRESSION:  Stable scarring in the right lung.  No acute chest findings.    Electronically Signed   By: Richarda Overlie M.D.   On: 12/05/2013 12:47    Assessment/Plan:  [name], MD [facility] Pulmonary/Critical Care 12/05/2013, 11:42 AM Pager:  [phone] After 3pm call: [phone]

## 2013-12-05 NOTE — Telephone Encounter (Signed)
I called and spoke with pt. She reports if Dr. Craige Cotta wants her to have CXR and labs done then she wants this to be done prior to her appt. Pt saw MW in September and had CXR and labs done at his visit.  Please advise Dr. Craige Cotta thanks

## 2013-12-05 NOTE — Telephone Encounter (Signed)
Called spoke with patient, advised of pending lab and cxr orders.  Pt aware to arrive early for her appt, then go for recommended tests.  Nothing further needed; will sign off.

## 2013-12-05 NOTE — Patient Instructions (Signed)
Will call with results of blood test Follow up in 3 months

## 2013-12-05 NOTE — Telephone Encounter (Signed)
Per VS - pt will need bloodwork and CXR before her appointment. Orders have been placed.

## 2013-12-07 DIAGNOSIS — M25519 Pain in unspecified shoulder: Secondary | ICD-10-CM | POA: Diagnosis not present

## 2013-12-08 ENCOUNTER — Telehealth: Payer: Self-pay | Admitting: Pulmonary Disease

## 2013-12-08 NOTE — Telephone Encounter (Signed)
lmtcb x1 

## 2013-12-08 NOTE — Telephone Encounter (Signed)
Pt is aware that Dr. Craige Cotta has not had a chance to take a look at her results. Advised her that I would call as soon as he did.  VS - pt is requesting results, please advise. Thanks!

## 2013-12-08 NOTE — Telephone Encounter (Signed)
Results have been explained to patient, pt expressed understanding. Nothing further needed. Pt aware to cont pred 5mg  qd mychart message sent as in with detailed lab results per pt request.

## 2013-12-08 NOTE — Assessment & Plan Note (Signed)
Stable clinically and on xray.  Will repeat her CBC with differential to assess eosinophil count.  She is to continue 5 mg prednisone daily for now.

## 2013-12-08 NOTE — Assessment & Plan Note (Signed)
She has progression of asthma symptoms since she ran out of dulera.  Will renew this.  She is to continue prn proair.

## 2013-12-08 NOTE — Telephone Encounter (Signed)
CBC    Component Value Date/Time   WBC 7.7 12/05/2013 1043   RBC 4.27 12/05/2013 1043   HGB 12.9 12/05/2013 1043   HCT 37.4 12/05/2013 1043   PLT 165.0 12/05/2013 1043   MCV 87.7 12/05/2013 1043   MCHC 34.3 12/05/2013 1043   RDW 16.3* 12/05/2013 1043   LYMPHSABS 1.9 12/05/2013 1043   MONOABS 0.8 12/05/2013 1043   EOSABS 0.6 12/05/2013 1043   BASOSABS 0.0 12/05/2013 1043    CMP     Component Value Date/Time   NA 140 12/05/2013 1043   K 3.7 12/05/2013 1043   CL 104 12/05/2013 1043   CO2 30 12/05/2013 1043   GLUCOSE 76 12/05/2013 1043   BUN 12 12/05/2013 1043   CREATININE 0.8 12/05/2013 1043   CALCIUM 9.2 12/05/2013 1043   PROT 7.4 12/05/2013 1043   ALBUMIN 4.2 12/05/2013 1043   AST 33 12/05/2013 1043   ALT 27 12/05/2013 1043   ALKPHOS 73 12/05/2013 1043   BILITOT 0.9 12/05/2013 1043    Please inform pt that percentage of eosinophils on blood test is better, but still relatively high.  She should continue prednisone 5 mg daily.

## 2013-12-12 ENCOUNTER — Other Ambulatory Visit: Payer: Self-pay | Admitting: Internal Medicine

## 2013-12-13 ENCOUNTER — Other Ambulatory Visit: Payer: Self-pay | Admitting: Pulmonary Disease

## 2013-12-29 DIAGNOSIS — Z8709 Personal history of other diseases of the respiratory system: Secondary | ICD-10-CM

## 2013-12-29 HISTORY — DX: Personal history of other diseases of the respiratory system: Z87.09

## 2014-01-18 ENCOUNTER — Ambulatory Visit (INDEPENDENT_AMBULATORY_CARE_PROVIDER_SITE_OTHER): Payer: Medicare Other | Admitting: Internal Medicine

## 2014-01-18 ENCOUNTER — Encounter: Payer: Self-pay | Admitting: Internal Medicine

## 2014-01-18 VITALS — BP 121/78 | HR 80 | Temp 98.2°F | Wt 159.8 lb

## 2014-01-18 DIAGNOSIS — M2550 Pain in unspecified joint: Secondary | ICD-10-CM | POA: Diagnosis not present

## 2014-01-18 DIAGNOSIS — F3289 Other specified depressive episodes: Secondary | ICD-10-CM | POA: Diagnosis not present

## 2014-01-18 DIAGNOSIS — R198 Other specified symptoms and signs involving the digestive system and abdomen: Secondary | ICD-10-CM | POA: Diagnosis not present

## 2014-01-18 DIAGNOSIS — R194 Change in bowel habit: Secondary | ICD-10-CM | POA: Insufficient documentation

## 2014-01-18 DIAGNOSIS — F329 Major depressive disorder, single episode, unspecified: Secondary | ICD-10-CM

## 2014-01-18 LAB — RHEUMATOID FACTOR: Rhuematoid fact SerPl-aCnc: 10 IU/mL (ref <=14)

## 2014-01-18 NOTE — Progress Notes (Signed)
Pre visit review using our clinic review tool, if applicable. No additional management support is needed unless otherwise documented below in the visit note. 

## 2014-01-18 NOTE — Assessment & Plan Note (Signed)
Sed rate, RA,CCP

## 2014-01-18 NOTE — Patient Instructions (Signed)
Please take the probiotic , Align, every day until the bowels are normal. This will replace the normal bacteria which  are necessary for formation of normal stool and processing of food.  I recommend a Hospice Grief Counselling  consultation .

## 2014-01-18 NOTE — Assessment & Plan Note (Signed)
Hospice grief counselling recommended

## 2014-01-18 NOTE — Progress Notes (Signed)
   Subjective:    Patient ID: Lisa Larson, female    DOB: 02/11/1941, 73 y.o.   MRN: [id number]  HPI  After colonoscopy at which a pre-malignant polyp was removed; she mistakenly took Citrucel instead of Align. This resulted in loosening of the bowels of significance. She realized her mistake and has restarted with some improvement in her symptoms. She questions lactose intolerance , but she eats ice cream nightly.   Review of Systems   She has had bilateral hip pain for 3-4 days. She questioned whether this might be related to Nexium. She also has some shoulder discomfort but she has orthopedic problems for which she's been in rehabilitation.   She also describes "hurting all over". This is despite taking 5 mg of maintenance prednisone for her eosinophilic pneumonia  She continues to be depressed in the context of her husband's death last year. This is manifested by anhedonia and frank depression.     Objective:   Physical Exam Gen.: adequately nourished in appearance. Alert, appropriate and cooperative throughout exam.Appears younger than stated age  Head: Normocephalic without obvious abnormalities Eyes: No corneal or conjunctival inflammation noted.  Neck: No deformities, masses, or tenderness noted. Range of motion decreased. Lungs: Normal respiratory effort; chest expands symmetrically. Lungs are clear to auscultation without rales, wheezes, or increased work of breathing. Heart: Normal rate and rhythm. Normal S1 and S2. No gallop, click, or rub. s4 w/o murmur. Abdomen: Bowel sounds normal; abdomen soft and nontender. No masses, organomegaly or hernias noted.                 Musculoskeletal/extremities:   Accentuated curvature of upper thoracic spine. No clubbing, cyanosis, or edema noted. Range of motion normal .Tone & strength normal. Pain with range of motion of the left shoulder Hand joints reveal significant  PIP/ DIP changes. Fingernail health good. Fusiform knees w/o  effusion Vascular: Carotid, radial artery, dorsalis pedis and  posterior tibial pulses are  equal. Pedal pulses decreased Neurologic: Alert and oriented x3. Using cane.       Skin: Intact without suspicious lesions or rashes. Lymph: No cervical, axillary lymphadenopathy present. Psych: Mood and affect are flat. Normally interactive                                                                                        Assessment & Plan:  #1 hip and shoulder girdle arthralgias  #2 Bowel changes most likely related to Citrucel. #3 depression See orders

## 2014-01-18 NOTE — Assessment & Plan Note (Signed)
See AVS

## 2014-01-19 LAB — SEDIMENTATION RATE: Sed Rate: 24 mm/hr — ABNORMAL HIGH (ref 0–22)

## 2014-01-19 LAB — CYCLIC CITRUL PEPTIDE ANTIBODY, IGG: Cyclic Citrullin Peptide Ab: <2 U/mL (ref 0.0–5.0)

## 2014-02-20 ENCOUNTER — Telehealth: Payer: Self-pay | Admitting: *Deleted

## 2014-02-20 NOTE — Telephone Encounter (Signed)
Both OK  She has steroid induced Osteopenia

## 2014-02-20 NOTE — Telephone Encounter (Signed)
Patient phoned and stated orders for her bone scan and her mammogram needed to be put into the EMR system so that she could get them completed today.  Please advise.  CB# [phone]

## 2014-03-08 ENCOUNTER — Telehealth: Payer: Self-pay | Admitting: *Deleted

## 2014-03-08 DIAGNOSIS — I10 Essential (primary) hypertension: Secondary | ICD-10-CM

## 2014-03-08 DIAGNOSIS — M899 Disorder of bone, unspecified: Secondary | ICD-10-CM | POA: Diagnosis not present

## 2014-03-08 DIAGNOSIS — Z1231 Encounter for screening mammogram for malignant neoplasm of breast: Secondary | ICD-10-CM | POA: Diagnosis not present

## 2014-03-08 DIAGNOSIS — M949 Disorder of cartilage, unspecified: Secondary | ICD-10-CM | POA: Diagnosis not present

## 2014-03-08 MED ORDER — LOSARTAN POTASSIUM 100 MG PO TABS
ORAL_TABLET | ORAL | Status: DC
Start: 2014-03-08 — End: 2015-03-11

## 2014-03-08 NOTE — Telephone Encounter (Signed)
Bettey Mare, RPh phoned requesting refills for patient's losartan and ativan.  Refilled losartan per protocol.  Please advise regarding ativan.  CB# [phone]

## 2014-03-08 NOTE — Telephone Encounter (Signed)
Lorazepam OK X1

## 2014-03-09 ENCOUNTER — Telehealth: Payer: Self-pay | Admitting: *Deleted

## 2014-03-09 MED ORDER — LORAZEPAM 1 MG PO TABS: ORAL_TABLET | ORAL | Status: DC

## 2014-03-09 NOTE — Telephone Encounter (Signed)
Requesting Lorazepam 1mg  Take 1/2 to 1 tablet by mouth every 8 hours as needed. Last refill:12-06-13;#30,0 Last OV:01-18-14  UDS:09-07-13-Low risk Please advise.//AB/CMA

## 2014-03-09 NOTE — Telephone Encounter (Signed)
Phoned in Ativan order, per MD, to Antony Haste, [place]

## 2014-03-09 NOTE — Telephone Encounter (Signed)
Rx printed and faxed to the pharmacy.//AB/CMA 

## 2014-03-09 NOTE — Telephone Encounter (Signed)
OK X1 

## 2014-03-09 NOTE — Addendum Note (Signed)
Addended by: Harl Bowie on: 03/09/2014 03:45 PM   Modules accepted: Orders

## 2014-03-11 ENCOUNTER — Encounter: Payer: Self-pay | Admitting: Internal Medicine

## 2014-03-20 ENCOUNTER — Ambulatory Visit
Admission: RE | Admit: 2014-03-20 | Discharge: 2014-03-20 | Disposition: A | Discharge status: Home / Self Care | Payer: Medicare Other | Source: Ambulatory Visit | Attending: Pulmonary Disease | Admitting: Pulmonary Disease

## 2014-03-20 ENCOUNTER — Other Ambulatory Visit (INDEPENDENT_AMBULATORY_CARE_PROVIDER_SITE_OTHER): Payer: Medicare Other

## 2014-03-20 ENCOUNTER — Ambulatory Visit (INDEPENDENT_AMBULATORY_CARE_PROVIDER_SITE_OTHER): Payer: Medicare Other | Admitting: Pulmonary Disease

## 2014-03-20 ENCOUNTER — Encounter: Payer: Self-pay | Admitting: Pulmonary Disease

## 2014-03-20 VITALS — BP 142/82 | HR 74 | Temp 97.3°F | Ht 60.0 in | Wt 165.8 lb

## 2014-03-20 DIAGNOSIS — J82 Pulmonary eosinophilia, not elsewhere classified: Principal | ICD-10-CM

## 2014-03-20 DIAGNOSIS — J8289 Other pulmonary eosinophilia, not elsewhere classified: Secondary | ICD-10-CM

## 2014-03-20 DIAGNOSIS — E119 Type 2 diabetes mellitus without complications: Secondary | ICD-10-CM

## 2014-03-20 DIAGNOSIS — J8281 Chronic eosinophilic pneumonia: Secondary | ICD-10-CM

## 2014-03-20 DIAGNOSIS — J309 Allergic rhinitis, unspecified: Secondary | ICD-10-CM

## 2014-03-20 DIAGNOSIS — J45998 Other asthma: Secondary | ICD-10-CM

## 2014-03-20 DIAGNOSIS — M109 Gout, unspecified: Secondary | ICD-10-CM

## 2014-03-20 DIAGNOSIS — J45909 Unspecified asthma, uncomplicated: Secondary | ICD-10-CM

## 2014-03-20 LAB — BASIC METABOLIC PANEL
BUN: 12 mg/dL (ref 6–23)
CO2: 29 mEq/L (ref 19–32)
Calcium: 9.5 mg/dL (ref 8.4–10.5)
Chloride: 103 mEq/L (ref 96–112)
Creatinine, Ser: 1 mg/dL (ref 0.4–1.2)
GFR: 60.91 mL/min (ref >60.00)
Glucose, Bld: 100 mg/dL — ABNORMAL HIGH (ref 70–99)
Potassium: 4 mEq/L (ref 3.5–5.1)
Sodium: 140 mEq/L (ref 135–145)

## 2014-03-20 LAB — CBC WITH DIFFERENTIAL/PLATELET
Basophils Absolute: 0 10*3/uL (ref 0.0–0.1)
Basophils Relative: 0.4 % (ref 0.0–3.0)
Eosinophils Absolute: 0.4 10*3/uL (ref 0.0–0.7)
Eosinophils Relative: 7.7 % — ABNORMAL HIGH (ref 0.0–5.0)
HCT: 35 % — ABNORMAL LOW (ref 36.0–46.0)
Hemoglobin: 11.8 g/dL — ABNORMAL LOW (ref 12.0–15.0)
Lymphocytes Relative: 22.5 % (ref 12.0–46.0)
Lymphs Abs: 1.2 10*3/uL (ref 0.7–4.0)
MCHC: 33.6 g/dL (ref 30.0–36.0)
MCV: 98 fl (ref 78.0–100.0)
Monocytes Absolute: 0.7 10*3/uL (ref 0.1–1.0)
Monocytes Relative: 12.1 % — ABNORMAL HIGH (ref 3.0–12.0)
Neutro Abs: 3.1 10*3/uL (ref 1.4–7.7)
Neutrophils Relative %: 57.3 % (ref 43.0–77.0)
Platelets: 136 10*3/uL — ABNORMAL LOW (ref 150.0–400.0)
RBC: 3.57 Mil/uL — ABNORMAL LOW (ref 3.87–5.11)
RDW: 15.4 % — ABNORMAL HIGH (ref 11.5–14.6)
WBC: 5.5 10*3/uL (ref 4.5–10.5)

## 2014-03-20 LAB — HEMOGLOBIN A1C: Hgb A1c MFr Bld: 5.3 % (ref 4.6–6.5)

## 2014-03-20 LAB — URIC ACID: Uric Acid, Serum: 5.9 mg/dL (ref 2.4–7.0)

## 2014-03-20 MED ORDER — MOMETASONE FURO-FORMOTEROL FUM 100-5 MCG/ACT IN AERO: 2.0000 | INHALATION_SPRAY | Freq: Two times a day (BID) | RESPIRATORY_TRACT | Status: DC

## 2014-03-20 NOTE — Patient Instructions (Signed)
Try salt water spray (nasal irrigation) for sinus congestion Check with your insurance company about alternative to dulera Chest xray and lab tests today Follow up in 3 months

## 2014-03-20 NOTE — Progress Notes (Signed)
Chief Complaint  Patient presents with  . Follow-up    C/o dry cough x1 month. Pt denies SOB, CP/tightness.     History of Present Illness: Lisa Larson is a 73 y.o. 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  She has occasional dry cough.  This happens mostly at night when her sinus are congested.  She denies fever, wheeze, or chest pain.  She has not had skin rash.  She remains on 5 mg prednisone daily.  Tests: CBC 01/14/11>>39% eosinophils 01/14/11>> HIV negative, ACE 39, RF negative, ANA 1:40, ANCA negative  CT chest 01/16/11>>Multifocal b/l ASD with GGO BAL 01/21/11>>48% Eosinophils  IgE 02/25/11>>282 CT chest 07/22/11>>resolution of ASD CT chest 10/03/11>>recurrence of nodular infiltrate Rt upper lobe 10/08/11>>resume prednisone 10/08/11 CXR 08/19/12>>no acute findings PFT 11/04/12>>FEV1 1.77 (87%), FEV1% 68, TLC 4.87 (101%), DLCO 69%, +BD from FEF 25-75%. 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  She  has past surgical history that includes Shoulder surgery (Left, 08-2008); Total abdominal hysterectomy; Tonsillectomy and adenoidectomy; Colonoscopy; Nasal sinus surgery; Appendectomy; Esophageal dilation; Bronchoscopy (12-2010); and Cataract extraction w/ intraocular lens  implant, bilateral (Bilateral).   Current Outpatient Prescriptions on File Prior to Visit  Medication Sig Dispense Refill  . Acetaminophen (TYLENOL ARTHRITIS PAIN PO) Take 1 capsule by mouth daily as needed.      Marland Kitchen albuterol (PROAIR HFA) 108 (90 BASE) MCG/ACT inhaler Inhale 2 puffs into the lungs every 6 (six)  hours as needed.  18 g  11  . calcium carbonate (OS-CAL) 600 MG TABS tablet Take 600 mg by mouth 2 (two) times daily with a meal.      . Cetirizine HCl 10 MG CAPS Take 10 mg by mouth daily.      . Cholecalciferol (VITAMIN D3) 2000 UNITS capsule Take 2,000 Units by mouth daily.      Marland Kitchen LORazepam (ATIVAN) 1 MG tablet TAKE 1/2 TO 1 TABLET BY MOUTH EVERY 8 HOURS AS NEEDED ONLY. (DO NOT TAKE WITHIN 8-12 HOURS OF ALCOHOL.)  30 tablet  0  . losartan (COZAAR) 100 MG tablet TAKE 1 TABLET BY MOUTH EVERY DAY  90 tablet  3  . mometasone-formoterol (DULERA) 100-5 MCG/ACT AERO Inhale 2 puffs into the lungs 2 (two) times daily.  2 Inhaler  0  . NON FORMULARY Take 1 tablet by mouth daily. Probiotic      . ondansetron (ZOFRAN) 4 MG tablet Take 1 tablet (4 mg total) by mouth every 8 (eight) hours as needed for nausea.  20 tablet  1  . predniSONE (DELTASONE) 5 MG tablet TAKE 1 TABLET BY MOUTH EVERY DAY  30 tablet  2  . sertraline (ZOLOFT) 100 MG tablet TAKE 1 TABLET BY MOUTH EVERY DAY  90 tablet  3  . thiamine (VITAMIN B-1) 100 MG tablet Take 50 mg by mouth daily.       . methylcellulose packet Take 1 each by mouth as needed for constipation.       No current facility-administered medications on file prior to visit.    Allergies  Allergen Reactions  . Sulfonamide Derivatives     Rash; dyspnea  . Benazepril Hcl     No PMH of angioedema; ACE-I caused cough  .  Diclofenac   . Oxycodone-Aspirin   . Rofecoxib     Physical Exam:  General - obese HEENT - no sinus tenderness, no oral exudate, no LAN Chest - no wheeze/rales/dullness Cardiac - s1s2 regular, no murmur Abd - soft, nontender Ext - no edema Neuro - normal strength Skin - no rash Psych - normal mood, behavior   Assessment/Plan:  [name], MD [place] 03/20/2014, 10:21 AM Pager:  [phone] After 3pm call: [phone]

## 2014-03-21 ENCOUNTER — Encounter: Payer: Self-pay | Admitting: Internal Medicine

## 2014-03-21 NOTE — Assessment & Plan Note (Signed)
She had recent symptoms of Rt 1st metatarsal pain, and resumed allopurinol.  Will check uric acid level at her request. Advised her to d/w her PCP.

## 2014-03-21 NOTE — Assessment & Plan Note (Signed)
Her current symptoms of cough are likely related to allergies and post-nasal drip.  She can try nasal irrigation.  Advised her call if she wants to resume nasal steroid spray.

## 2014-03-21 NOTE — Assessment & Plan Note (Addendum)
Will repeat her chest xray and lab work today.  Will then decide about whether she can try to reduce her dose of prednisone.  She is concerned about whether she is getting diabetes from being on prednisone.  Will check her HbA1C.

## 2014-03-21 NOTE — Assessment & Plan Note (Signed)
She will check with her insurance company about which alternate LABA/ICS inhaler they will allow in place of dulera.

## 2014-03-23 ENCOUNTER — Telehealth: Payer: Self-pay | Admitting: Pulmonary Disease

## 2014-03-23 NOTE — Telephone Encounter (Signed)
Lab Results  Component Value Date   HGBA1C 5.3 03/20/2014   URIC ACID 5.9  CBC    Component Value Date/Time   WBC 5.5 03/20/2014 1053   RBC 3.57* 03/20/2014 1053   HGB 11.8* 03/20/2014 1053   HCT 35.0* 03/20/2014 1053   PLT 136.0* 03/20/2014 1053   MCV 98.0 03/20/2014 1053   MCHC 33.6 03/20/2014 1053   RDW 15.4* 03/20/2014 1053   LYMPHSABS 1.2 03/20/2014 1053   MONOABS 0.7 03/20/2014 1053   EOSABS 0.4 03/20/2014 1053   BASOSABS 0.0 03/20/2014 1053    BMET    Component Value Date/Time   NA 140 03/20/2014 1053   K 4.0 03/20/2014 1053   CL 103 03/20/2014 1053   CO2 29 03/20/2014 1053   GLUCOSE 100* 03/20/2014 1053   BUN 12 03/20/2014 1053   CREATININE 1.0 03/20/2014 1053   CALCIUM 9.5 03/20/2014 1053    Will have my nurse inform pt that labs are normal.  She needs to come back in to get chest xray >> will call her with results and discuss plans then regarding prednisone.

## 2014-03-27 NOTE — Telephone Encounter (Signed)
lmtcb x1 

## 2014-03-27 NOTE — Telephone Encounter (Signed)
Pt is aware of results. She knows that she needs an xray, attempted to have this done last week but the xray machine was broken. Pt will be in town tomorrow and will xray done then.

## 2014-03-28 ENCOUNTER — Telehealth: Payer: Self-pay | Admitting: Pulmonary Disease

## 2014-03-28 ENCOUNTER — Ambulatory Visit (INDEPENDENT_AMBULATORY_CARE_PROVIDER_SITE_OTHER)
Admission: RE | Admit: 2014-03-28 | Discharge: 2014-03-28 | Disposition: A | Discharge status: Home / Self Care | Payer: Medicare Other | Source: Ambulatory Visit | Attending: Pulmonary Disease | Admitting: Pulmonary Disease

## 2014-03-28 ENCOUNTER — Other Ambulatory Visit: Payer: Self-pay | Admitting: Pulmonary Disease

## 2014-03-28 DIAGNOSIS — J8289 Other pulmonary eosinophilia, not elsewhere classified: Secondary | ICD-10-CM | POA: Diagnosis not present

## 2014-03-28 DIAGNOSIS — J984 Other disorders of lung: Secondary | ICD-10-CM | POA: Diagnosis not present

## 2014-03-28 DIAGNOSIS — J82 Pulmonary eosinophilia, not elsewhere classified: Principal | ICD-10-CM

## 2014-03-28 MED ORDER — AZITHROMYCIN 250 MG PO TABS
ORAL_TABLET | ORAL | Status: DC
Start: 2014-03-28 — End: 2014-05-09

## 2014-03-28 NOTE — Telephone Encounter (Signed)
Per SN----  zpak #1  Take as directed Continue the prednisone the same Use nasal saline rec by Dr. Halford Chessman at the last OV.  thanks

## 2014-03-28 NOTE — Telephone Encounter (Signed)
Per SN----  cxr with stable scarring at the right middle lobe.  NAD.  thanks

## 2014-03-28 NOTE — Telephone Encounter (Signed)
I spoke with patient about results and she verbalized understanding and had no questions 

## 2014-03-28 NOTE — Telephone Encounter (Signed)
Called and spoke with pt. She reports she has a cold. C/o nasal congestion, slight dry cough, just doesn't feel good, slight wheezing but no chest tx. Pt saw VS 03/20/14. Pt is going to have her CXR done today.  Pt takes fexofenadine. Pt requesting recs. Please advise Dr. Lenna Gilford as VS is on vacation   Allergies  Allergen Reactions  . Sulfonamide Derivatives     Rash; dyspnea  . Benazepril Hcl     No PMH of angioedema; ACE-I caused cough  . Diclofenac   . Oxycodone-Aspirin   . Rofecoxib

## 2014-03-28 NOTE — Telephone Encounter (Signed)
Called made pt aware of recs. She voiced her understanding. She had CXR done today and she wants these results today. She is worried about what it might look like. VS is vacation. Please advise SN. CXR has been printed off as well. thanks

## 2014-03-30 ENCOUNTER — Telehealth: Payer: Self-pay | Admitting: Pulmonary Disease

## 2014-03-30 NOTE — Telephone Encounter (Signed)
Pt is aware of results. 

## 2014-03-30 NOTE — Telephone Encounter (Signed)
Dg Chest 2 View  03/28/2014   CLINICAL DATA:  Cough, ex-smoker, 3 month follow-up examination  EXAM: CHEST  2 VIEW  COMPARISON:  DG CHEST 2 VIEW dated 12/05/2013  FINDINGS: The lungs are adequately inflated. Stable scarring in the right middle lobe is present and stable. There is no alveolar pneumonia. There is no pleural effusion or pneumothorax. The cardiac silhouette is top-normal in size. The pulmonary vascularity is not engorged. The mediastinum is normal in width. The observed portions of the bony thorax exhibit degenerative change but no acute abnormality.  IMPRESSION: There are chronic changes present as described. There is no evidence of active cardiopulmonary disease.   Electronically Signed   By: David  [place]   On: 03/28/2014 12:46   Please inform pt that CXR shows stable scarring.  No change to current treatment plan.

## 2014-05-04 ENCOUNTER — Telehealth: Payer: Self-pay | Admitting: Gastroenterology

## 2014-05-04 NOTE — Telephone Encounter (Signed)
Pt has a change in bowels after she lost her husband and became very stressed would like to be seen.  She was given an appointment for 05/08/14 with Amy

## 2014-05-08 ENCOUNTER — Ambulatory Visit: Payer: Medicare Other | Admitting: Physician Assistant

## 2014-05-09 ENCOUNTER — Ambulatory Visit (INDEPENDENT_AMBULATORY_CARE_PROVIDER_SITE_OTHER): Payer: Medicare Other | Admitting: Physician Assistant

## 2014-05-09 ENCOUNTER — Other Ambulatory Visit (INDEPENDENT_AMBULATORY_CARE_PROVIDER_SITE_OTHER): Payer: Medicare Other

## 2014-05-09 ENCOUNTER — Encounter: Payer: Self-pay | Admitting: Physician Assistant

## 2014-05-09 VITALS — BP 124/70 | HR 68 | Ht 60.0 in | Wt 165.2 lb

## 2014-05-09 DIAGNOSIS — R197 Diarrhea, unspecified: Secondary | ICD-10-CM | POA: Diagnosis not present

## 2014-05-09 DIAGNOSIS — R195 Other fecal abnormalities: Secondary | ICD-10-CM

## 2014-05-09 LAB — CBC WITH DIFFERENTIAL/PLATELET
Basophils Absolute: 0 10*3/uL (ref 0.0–0.1)
Basophils Relative: 0.3 % (ref 0.0–3.0)
Eosinophils Absolute: 0.3 10*3/uL (ref 0.0–0.7)
Eosinophils Relative: 4.9 % (ref 0.0–5.0)
HCT: 34.9 % — ABNORMAL LOW (ref 36.0–46.0)
Hemoglobin: 11.9 g/dL — ABNORMAL LOW (ref 12.0–15.0)
Lymphocytes Relative: 11.3 % — ABNORMAL LOW (ref 12.0–46.0)
Lymphs Abs: 0.7 10*3/uL (ref 0.7–4.0)
MCHC: 34 g/dL (ref 30.0–36.0)
MCV: 100.5 fl — ABNORMAL HIGH (ref 78.0–100.0)
Monocytes Absolute: 0.4 10*3/uL (ref 0.1–1.0)
Monocytes Relative: 6.9 % (ref 3.0–12.0)
Neutro Abs: 4.5 10*3/uL (ref 1.4–7.7)
Neutrophils Relative %: 76.6 % (ref 43.0–77.0)
Platelets: 126 10*3/uL — ABNORMAL LOW (ref 150.0–400.0)
RBC: 3.47 Mil/uL — ABNORMAL LOW (ref 3.87–5.11)
RDW: 15.7 % — ABNORMAL HIGH (ref 11.5–15.5)
WBC: 5.9 10*3/uL (ref 4.0–10.5)

## 2014-05-09 MED ORDER — RESTORA PO CAPS
1.0000 | ORAL_CAPSULE | Freq: Every day | ORAL | Status: DC
Start: 2014-05-09 — End: 2014-10-02

## 2014-05-09 MED ORDER — CILIDINIUM-CHLORDIAZEPOXIDE 2.5-5 MG PO CAPS: ORAL_CAPSULE | ORAL | Status: DC

## 2014-05-09 NOTE — Progress Notes (Signed)
Subjective:    Patient ID: Lisa Larson, female    DOB: 09/06/1941, 73 y.o.   MRN: [id number]  HPI  Lisa Larson is a pleasant 73 year old white female known to Dr. Ardis Hughs. She had been seen in July of 2014 with complaints of constipation and rectal discomfort and underwent a colonoscopy with finding of 2 small sessile polyps which were removed. These were 2 mm to 4 mm in size and were tubular adenomas. Exam was otherwise negative. Patient has had a difficult winter, was diagnosed with eosinophilic pneumonia and has been on low-dose steroids, she also has history of hypertension, COPD, anxiety, hyperlipidemia, and depression. She lost her husband about a year ago. Patient comes in today do to change in bowel habits. She has developed postprandial urgency and frequent stools and says that on most days her stools are very loose. She has had some intermittent nausea especially early in the mornings. She denies any ongoing abdominal pain or cramping. Her appetite has been decreased however she is actually gained weight over the past few months due to prednisone. Generally not seeing any blood in her stools but says she saw scant amount of blood on the tissue a couple of times recently. She states is so far today she has had 5 bowel movements in May range from 4-6 bowel movements per day. She has been afraid to go out to eat  because eating definitely exacerbates her symptoms. She was seen by Dr. [name]  a few months ago and was asked to start on a probiotic ,which she said she had been taking fairly regularly in the form of Align, but did not notice any change. She also tried Citrucel but said that caused an increase in stool so she stopped it. Patient had also taken a Z-Pak about a month ago in addition to being on steroids over the past few months. She has been on Zoloft for depression and has a prescription for Ativan which she says definitely helps and sometimes will help with her urgency as  well.    Review of Systems  Constitutional: Positive for appetite change.  HENT: Negative.   Eyes: Negative.   Respiratory: Positive for cough.   Gastrointestinal: Positive for diarrhea and anal bleeding.  Endocrine: Negative.   Genitourinary: Negative.   Musculoskeletal: Positive for arthralgias.  Skin: Negative.   Allergic/Immunologic: Negative.   Neurological: Negative.   Hematological: Negative.   Psychiatric/Behavioral: Positive for dysphoric mood. The patient is nervous/anxious.    Outpatient Prescriptions Prior to Visit  Medication Sig Dispense Refill  . Acetaminophen (TYLENOL ARTHRITIS PAIN PO) Take 1 capsule by mouth daily as needed.      Marland Kitchen albuterol (PROAIR HFA) 108 (90 BASE) MCG/ACT inhaler Inhale 2 puffs into the lungs every 6 (six) hours as needed.  18 g  11  . allopurinol (ZYLOPRIM) 300 MG tablet Take 0.5 tablets by mouth daily.      . calcium carbonate (OS-CAL) 600 MG TABS tablet Take 600 mg by mouth 2 (two) times daily with a meal.      . Cetirizine HCl 10 MG CAPS Take 10 mg by mouth daily.      . Cholecalciferol (VITAMIN D3) 2000 UNITS capsule Take 2,000 Units by mouth daily.      Marland Kitchen esomeprazole (NEXIUM) 40 MG capsule Take 20 mg by mouth daily before breakfast.      . LORazepam (ATIVAN) 1 MG tablet TAKE 1/2 TO 1 TABLET BY MOUTH EVERY 8 HOURS AS NEEDED ONLY. (DO NOT TAKE  WITHIN 8-12 HOURS OF ALCOHOL.)  30 tablet  0  . losartan (COZAAR) 100 MG tablet TAKE 1 TABLET BY MOUTH EVERY DAY  90 tablet  3  . methylcellulose packet Take 1 each by mouth as needed for constipation.      . mometasone-formoterol (DULERA) 100-5 MCG/ACT AERO Inhale 2 puffs into the lungs 2 (two) times daily.  2 Inhaler  0  . NON FORMULARY Take 1 tablet by mouth daily. Probiotic      . ondansetron (ZOFRAN) 4 MG tablet Take 1 tablet (4 mg total) by mouth every 8 (eight) hours as needed for nausea.  20 tablet  1  . predniSONE (DELTASONE) 5 MG tablet TAKE 1 TABLET BY MOUTH EVERY DAY  30 tablet  2  .  sertraline (ZOLOFT) 100 MG tablet TAKE 1 TABLET BY MOUTH EVERY DAY  90 tablet  3  . thiamine (VITAMIN B-1) 100 MG tablet Take 50 mg by mouth daily.       Marland Kitchen azithromycin (ZITHROMAX) 250 MG tablet Take as directed  6 tablet  0   No facility-administered medications prior to visit.   Allergies  Allergen Reactions  . Sulfonamide Derivatives     Rash; dyspnea  . Benazepril Hcl     No PMH of angioedema; ACE-I caused cough  . Diclofenac   . Oxycodone-Aspirin   . Rofecoxib    Patient Active Problem List   Diagnosis Date Noted  . Arthralgia of multiple sites, bilateral 01/18/2014  . Bowel habit changes 01/18/2014  . UTI (urinary tract infection) 08/20/2013  . Nausea 02/21/2013  . Excessive cerumen in ear canal 10/12/2012  . Allergic rhinitis 04/17/2011  . EOSINOPHILIC PNEUMONIA 62/69/4854  . DM 11/28/2010  . Asthma, persistent controlled 11/28/2010  . OSTEOPENIA 01/01/2010  . GOUT, UNSPECIFIED 05/04/2009  . HYPERLIPIDEMIA 09/29/2008  . ANXIETY STATE, UNSPECIFIED 09/29/2008  . DEPRESSION 09/29/2008  . HYPERTRIGLYCERIDEMIA 08/09/2008  . 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 GERD 09/29/2007  . DEGENERATIVE JOINT DISEASE 06/09/2007   History  Substance Use Topics  . Smoking status: Former Smoker -- 1.00 packs/day for 15 years    Types: Cigarettes    Quit date: 12/29/1968  . Smokeless tobacco: Never Used     Comment: smoked 1966- ? 1970, up to 1 ppd  . Alcohol Use: 16.8 oz/week    28 Glasses of wine per week     Comment: 4 a day      family history includes Alzheimer's disease in her mother; Arthritis in her father; Cancer in her son; Colitis in her mother; Diabetes in her brother; Hypertension in her brother, father, and mother; Irritable bowel syndrome in her mother; Non-Hodgkin's lymphoma in her brother; Other in her son; Rheum arthritis in her father. There is no history of Stroke or Colon cancer.  Objective:   Physical Exam  well-developed older white female in no acute  distress, pleasant blood pressure 124/70 pulse 68 height 5 foot weight 165. HEENT ;nontraumatic normocephalic EOMI PERRLA sclera anicteric, Supple; no JVD, Cardiovascular; regular rate and rhythm with S1-S2 no murmur or gallop, Pulmonary; clear bilaterally, Abdomen; soft basically nontender there is no palpable mass or hepatosplenomegaly bowel sounds are active, Rectal; exam small external skin tag stool is brown and heme positive, Extremities; no clubbing cyanosis or edema skin warm and dry, Psych; mood and affect appropriate        Assessment & Plan:  #94  73 year old female with several month history of change in bowel habits with urgency and diarrhea/loose stools.  She is heme positive but this may be from local anorectal irritation. Negative colonoscopy less than 1 year ago save 2 small tubular adenomas. 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

## 2014-05-09 NOTE — Progress Notes (Signed)
i agree with the plan above 

## 2014-05-09 NOTE — Patient Instructions (Signed)
Please go to the basement level to have your labs drawn and stool test. Take a probiotic daily. Some suggestions of probiotic. 1. Restora 2. Culturel 3. Hardin Negus colon health  We sent a prescription for Librax to [facility] [employer].

## 2014-05-10 ENCOUNTER — Telehealth: Payer: Self-pay | Admitting: Physician Assistant

## 2014-05-10 LAB — GASTROINTESTINAL PATHOGEN PANEL PCR
C. difficile Tox A/B, PCR: NEGATIVE
Campylobacter, PCR: NEGATIVE
Cryptosporidium, PCR: NEGATIVE
E coli (ETEC) LT/ST PCR: NEGATIVE
E coli (STEC) stx1/stx2, PCR: NEGATIVE
E coli 0157, PCR: NEGATIVE
Giardia lamblia, PCR: NEGATIVE
Norovirus, PCR: NEGATIVE
Rotavirus A, PCR: NEGATIVE
Salmonella, PCR: NEGATIVE
Shigella, PCR: NEGATIVE

## 2014-05-10 MED ORDER — DICYCLOMINE HCL 10 MG PO CAPS: ORAL_CAPSULE | ORAL | Status: DC

## 2014-05-10 NOTE — Telephone Encounter (Signed)
Called the patient to advise Amy Esterwood PA-C agreed to prescribing the Bentyl ( dicyclomine ) . She can take 1 tab with breakfast and 1 tab with dinner.  I let the patient know this medication will be less expensive than the Librax. She wanted this prescription sent to [facility].

## 2014-05-11 ENCOUNTER — Telehealth: Payer: Self-pay | Admitting: Physician Assistant

## 2014-05-11 NOTE — Telephone Encounter (Signed)
See note attached to labs 

## 2014-05-11 NOTE — Telephone Encounter (Signed)
Patient requesting lab results. Please, advise

## 2014-05-29 ENCOUNTER — Other Ambulatory Visit: Payer: Self-pay

## 2014-05-29 DIAGNOSIS — F3289 Other specified depressive episodes: Secondary | ICD-10-CM

## 2014-05-29 DIAGNOSIS — F329 Major depressive disorder, single episode, unspecified: Secondary | ICD-10-CM

## 2014-05-29 MED ORDER — SERTRALINE HCL 100 MG PO TABS: ORAL_TABLET | ORAL | Status: DC

## 2014-05-29 NOTE — Telephone Encounter (Signed)
Last ov 01/18/14

## 2014-05-29 NOTE — Telephone Encounter (Signed)
OK # [phone] 

## 2014-06-13 ENCOUNTER — Ambulatory Visit (INDEPENDENT_AMBULATORY_CARE_PROVIDER_SITE_OTHER): Payer: Medicare Other | Admitting: Internal Medicine

## 2014-06-13 ENCOUNTER — Encounter: Payer: Self-pay | Admitting: Internal Medicine

## 2014-06-13 VITALS — BP 130/70 | HR 87 | Temp 98.2°F | Wt 163.4 lb

## 2014-06-13 DIAGNOSIS — IMO0002 Reserved for concepts with insufficient information to code with codable children: Secondary | ICD-10-CM

## 2014-06-13 DIAGNOSIS — S139XXA Sprain of joints and ligaments of unspecified parts of neck, initial encounter: Secondary | ICD-10-CM | POA: Diagnosis not present

## 2014-06-13 DIAGNOSIS — M533 Sacrococcygeal disorders, not elsewhere classified: Secondary | ICD-10-CM

## 2014-06-13 NOTE — Progress Notes (Signed)
Pre visit review using our clinic review tool, if applicable. No additional management support is needed unless otherwise documented below in the visit note. 

## 2014-06-13 NOTE — Patient Instructions (Signed)
Use an anti-inflammatory cream such as Aspercreme or Zostrix cream twice a day to the affected area as needed. In lieu of this warm moist compresses or  hot water bottle can be used. Do not apply ice .  If the  symptoms persist or progress;  I recommend a Physical Therapy  consultation .  Use a rubber doughnut when sitting for prolonged period time to prevent discomfort in the coccyx. Soaking  in hot tub will also provide relief.

## 2014-06-13 NOTE — Progress Notes (Signed)
   Subjective:    Patient ID: Lisa Larson, female    DOB: May 13, 1941, 73 y.o.   MRN: [id number]  HPI  Approximately 8 days ago her left ankle gave way and she slid to the floor landing on her buttocks.  Since that time she's had pain in the posterior neck which has been difficult to qualify; she states it has been as severe as a level VII. With nonsteroidals it has decreased to level V.  She has a past medical history of "broken coccyx with childbirth". Since the event she's had soreness in this area.  She states her son insisted she come in because he was concerned about her color and that  it might indicate that she was "getting ready to have an asthma attack".      Review of Systems  She has no numbness, tingling, or weakness in the extremities  She has no incontinence of urine or stool.  She denies any melena or rectal bleeding.       Objective:   Physical Exam   She appears adequately nourished and in no distress.  I cannot appreciate any significant pallor.  Thyroid normal to palpation  She has no scleral icterus or jaundice.  She has no lymphadenopathy about the neck or axilla  There is decreased range of motion of the neck laterally Chest is clear with no wheezing; no increased work of breathing  S4 is present with no dysrhythmias  Deep tendon reflexes are equal in the upper extremities  There is no loss of strength in  the upper extremities on direct  testing .  She does have mixed arthritic changes in hands, most severe at the DIP joints.  She's tender over the coccyx to palpation         Assessment & Plan:  #1 cervical musculo skeletal pain without neurologic deficit  #2 coccydynia, acute on chronic  See recommendations

## 2014-06-26 ENCOUNTER — Ambulatory Visit (INDEPENDENT_AMBULATORY_CARE_PROVIDER_SITE_OTHER): Payer: Medicare Other | Admitting: Pulmonary Disease

## 2014-06-26 ENCOUNTER — Encounter: Payer: Self-pay | Admitting: Pulmonary Disease

## 2014-06-26 VITALS — BP 128/82 | HR 78 | Ht 60.0 in | Wt 163.6 lb

## 2014-06-26 DIAGNOSIS — J302 Other seasonal allergic rhinitis: Secondary | ICD-10-CM

## 2014-06-26 DIAGNOSIS — J8289 Other pulmonary eosinophilia, not elsewhere classified: Secondary | ICD-10-CM

## 2014-06-26 DIAGNOSIS — J3089 Other allergic rhinitis: Secondary | ICD-10-CM | POA: Diagnosis not present

## 2014-06-26 DIAGNOSIS — J45998 Other asthma: Secondary | ICD-10-CM

## 2014-06-26 DIAGNOSIS — J45909 Unspecified asthma, uncomplicated: Secondary | ICD-10-CM | POA: Diagnosis not present

## 2014-06-26 DIAGNOSIS — J82 Pulmonary eosinophilia, not elsewhere classified: Principal | ICD-10-CM

## 2014-06-26 MED ORDER — PREDNISONE 2.5 MG PO TABS: 2.5000 mg | ORAL_TABLET | Freq: Every day | ORAL | Status: DC

## 2014-06-26 NOTE — Progress Notes (Signed)
Chief Complaint  Patient presents with  . Follow-up    Pt states no complaints with breathing since last OV.     History of Present Illness: Lisa Larson is a 73 y.o. 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  She recently fell after her ankle gave out.  She hurt her neck/back/coccyx are, but is slowly getting better.  Her breathing has been okay.  Her son says she wheezes sometimes, but she is not aware of this.  She has not needed to use proair much, and is not using dulera every day.  She has notice coughing episodes mostly at night.  She remains on prednisone 5 mg daily.  She denies any fever, chest pain, hemoptysis, skin rash, or leg swelling.  Tests: CBC 01/14/11>>39% eosinophils 01/14/11>> HIV negative, ACE 39, RF negative, ANA 1:40, ANCA negative  CT chest 01/16/11>>Multifocal b/l ASD with GGO BAL 01/21/11>>48% Eosinophils  IgE 02/25/11>>282 CT chest 07/22/11>>resolution of ASD CT chest 10/03/11>>recurrence of nodular infiltrate Rt upper lobe 10/08/11>>resume prednisone 10/08/11 CXR 08/19/12>>no acute findings PFT 11/04/12>>FEV1 1.77 (87%), FEV1% 68, TLC 4.87 (101%), DLCO 69%, +BD from FEF 25-75%. 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  She  has past surgical history that includes Shoulder surgery (Left, 08-2008); Total abdominal hysterectomy; Tonsillectomy and adenoidectomy; Colonoscopy; Nasal sinus surgery; Appendectomy; Esophageal dilation; Bronchoscopy (12-2010); and Cataract extraction w/ intraocular lens  implant, bilateral (Bilateral).   Current Outpatient Prescriptions  on File Prior to Visit  Medication Sig Dispense Refill  . Acetaminophen (TYLENOL ARTHRITIS PAIN PO) Take 1 capsule by mouth daily as needed.      Marland Kitchen albuterol (PROAIR HFA) 108 (90 BASE) MCG/ACT inhaler Inhale 2 puffs into the lungs every 6 (six) hours as needed.  18 g  11  . calcium carbonate (OS-CAL) 600 MG TABS tablet Take 600 mg by mouth 2 (two) times daily with a meal.      . Cetirizine HCl 10 MG CAPS Take 10 mg by mouth daily.      . Cholecalciferol (VITAMIN D3) 2000 UNITS capsule Take 2,000 Units by mouth daily.      Marland Kitchen dicyclomine (BENTYL) 10 MG capsule Take 1 tab with breakfast and 1 tab with dinner.  60 capsule  2  . esomeprazole (NEXIUM) 40 MG capsule Take 20 mg by mouth daily as needed.       Marland Kitchen LORazepam (ATIVAN) 1 MG tablet TAKE 1/2 TO 1 TABLET BY MOUTH EVERY 8 HOURS AS NEEDED ONLY. (DO NOT TAKE WITHIN 8-12 HOURS OF ALCOHOL.)  30 tablet  0  . losartan (COZAAR) 100 MG tablet TAKE 1 TABLET BY MOUTH EVERY DAY  90 tablet  3  . methylcellulose packet Take 1 each by mouth as needed for constipation.      . mometasone-formoterol (DULERA) 100-5 MCG/ACT AERO Inhale 2 puffs into the lungs 2 (two) times daily.  2 Inhaler  0  . NON FORMULARY Take 1 tablet by mouth daily. Probiotic      . ondansetron (ZOFRAN) 4 MG tablet Take 1 tablet (4 mg total) by mouth every 8 (eight) hours as needed for nausea.  20 tablet  1  . predniSONE (DELTASONE) 5 MG tablet TAKE 1 TABLET BY MOUTH EVERY DAY  30 tablet  2  . Probiotic Product (RESTORA) CAPS Take 1 capsule by mouth daily.  20 capsule  0  . sertraline (ZOLOFT) 100 MG tablet TAKE 1 TABLET BY MOUTH EVERY DAY  90 tablet  0  . thiamine (VITAMIN B-1) 100 MG tablet Take 50 mg by mouth daily.        No current facility-administered medications on file prior to visit.    Allergies  Allergen Reactions  . Sulfonamide Derivatives     Rash; dyspnea  . Benazepril Hcl     No PMH of angioedema; ACE-I caused cough  . Diclofenac   . Oxycodone-Aspirin   . Rofecoxib      Physical Exam:  General - obese HEENT - no sinus tenderness, no oral exudate, no LAN Chest - no wheeze/rales/dullness Cardiac - s1s2 regular, no murmur Abd - soft, nontender Ext - no edema Neuro - normal strength Skin - no rash Psych - normal mood, behavior   Assessment/Plan:  Lisa Mires, MD [place] 06/26/2014, 10:16 AM Pager:  [phone] After 3pm call: [phone]

## 2014-06-26 NOTE — Patient Instructions (Signed)
Prednisone 2.5 mg pill daily Follow up in 3 months with Chest xray, CBC with differential, and BMET

## 2014-06-28 ENCOUNTER — Other Ambulatory Visit: Payer: Self-pay | Admitting: Internal Medicine

## 2014-06-28 DIAGNOSIS — E119 Type 2 diabetes mellitus without complications: Secondary | ICD-10-CM

## 2014-07-06 NOTE — Assessment & Plan Note (Signed)
Stable.  Will decrease her dose of prednisone to 2.5 mg daily.  Will plan to repeat CXR, and lab work prior to next visit.

## 2014-07-06 NOTE — Assessment & Plan Note (Signed)
She is to continue dulera.  Explained that she could use this more regularly at night if her nocturnal cough symptoms persist.

## 2014-07-06 NOTE — Assessment & Plan Note (Signed)
Stable

## 2014-07-10 ENCOUNTER — Encounter: Payer: Medicare Other | Admitting: Internal Medicine

## 2014-08-16 ENCOUNTER — Other Ambulatory Visit: Payer: Self-pay | Admitting: Pulmonary Disease

## 2014-08-17 ENCOUNTER — Encounter: Payer: Medicare Other | Admitting: Internal Medicine

## 2014-08-17 NOTE — Telephone Encounter (Signed)
Pt last had refill 05/25/13 for zofran x 1 refill by Dr. Halford Chessman.  Please advise thanks

## 2014-08-21 ENCOUNTER — Telehealth: Payer: Self-pay | Admitting: Pulmonary Disease

## 2014-08-21 MED ORDER — MOMETASONE FURO-FORMOTEROL FUM 100-5 MCG/ACT IN AERO: 2.0000 | INHALATION_SPRAY | Freq: Two times a day (BID) | RESPIRATORY_TRACT | Status: DC

## 2014-08-21 NOTE — Telephone Encounter (Signed)
Spoke with the pt  She states that the [facility] is not covered by her ins  She is asking for samples  I advised will leave 2 boxes up front, but will need to bring drug formulary list to her upcoming appt so that VS can chose something more affordable  Pt verbalized understanding  Nothing further needed

## 2014-09-18 ENCOUNTER — Encounter: Payer: Medicare Other | Admitting: Internal Medicine

## 2014-09-20 IMAGING — CR DG CHEST 2V
2 series · 2 of 2 positions shown · non-contrast
Comparison: DG CHEST 2 VIEW dated 12/05/2013

CLINICAL DATA: Cough, ex-smoker, 3 month follow-up examination

EXAM:
CHEST  2 VIEW

[view not recorded (1 of 2)]
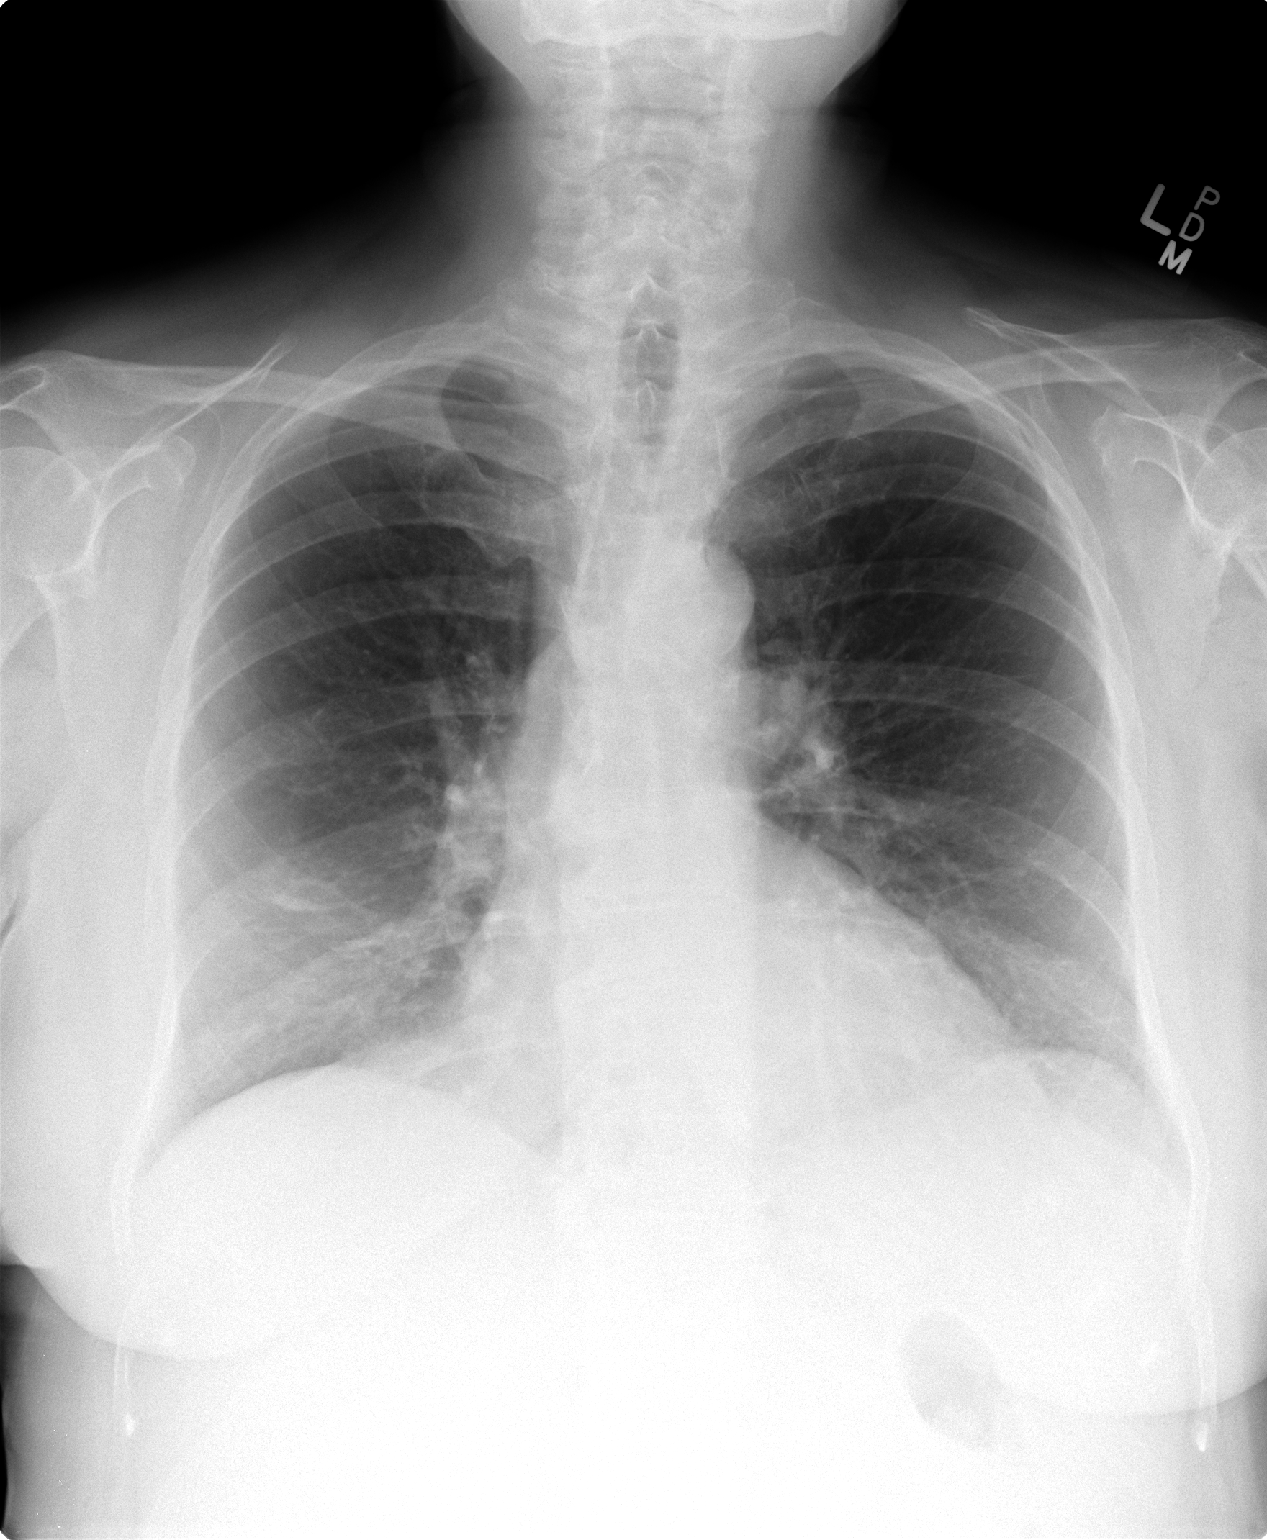

[view not recorded (2 of 2)]
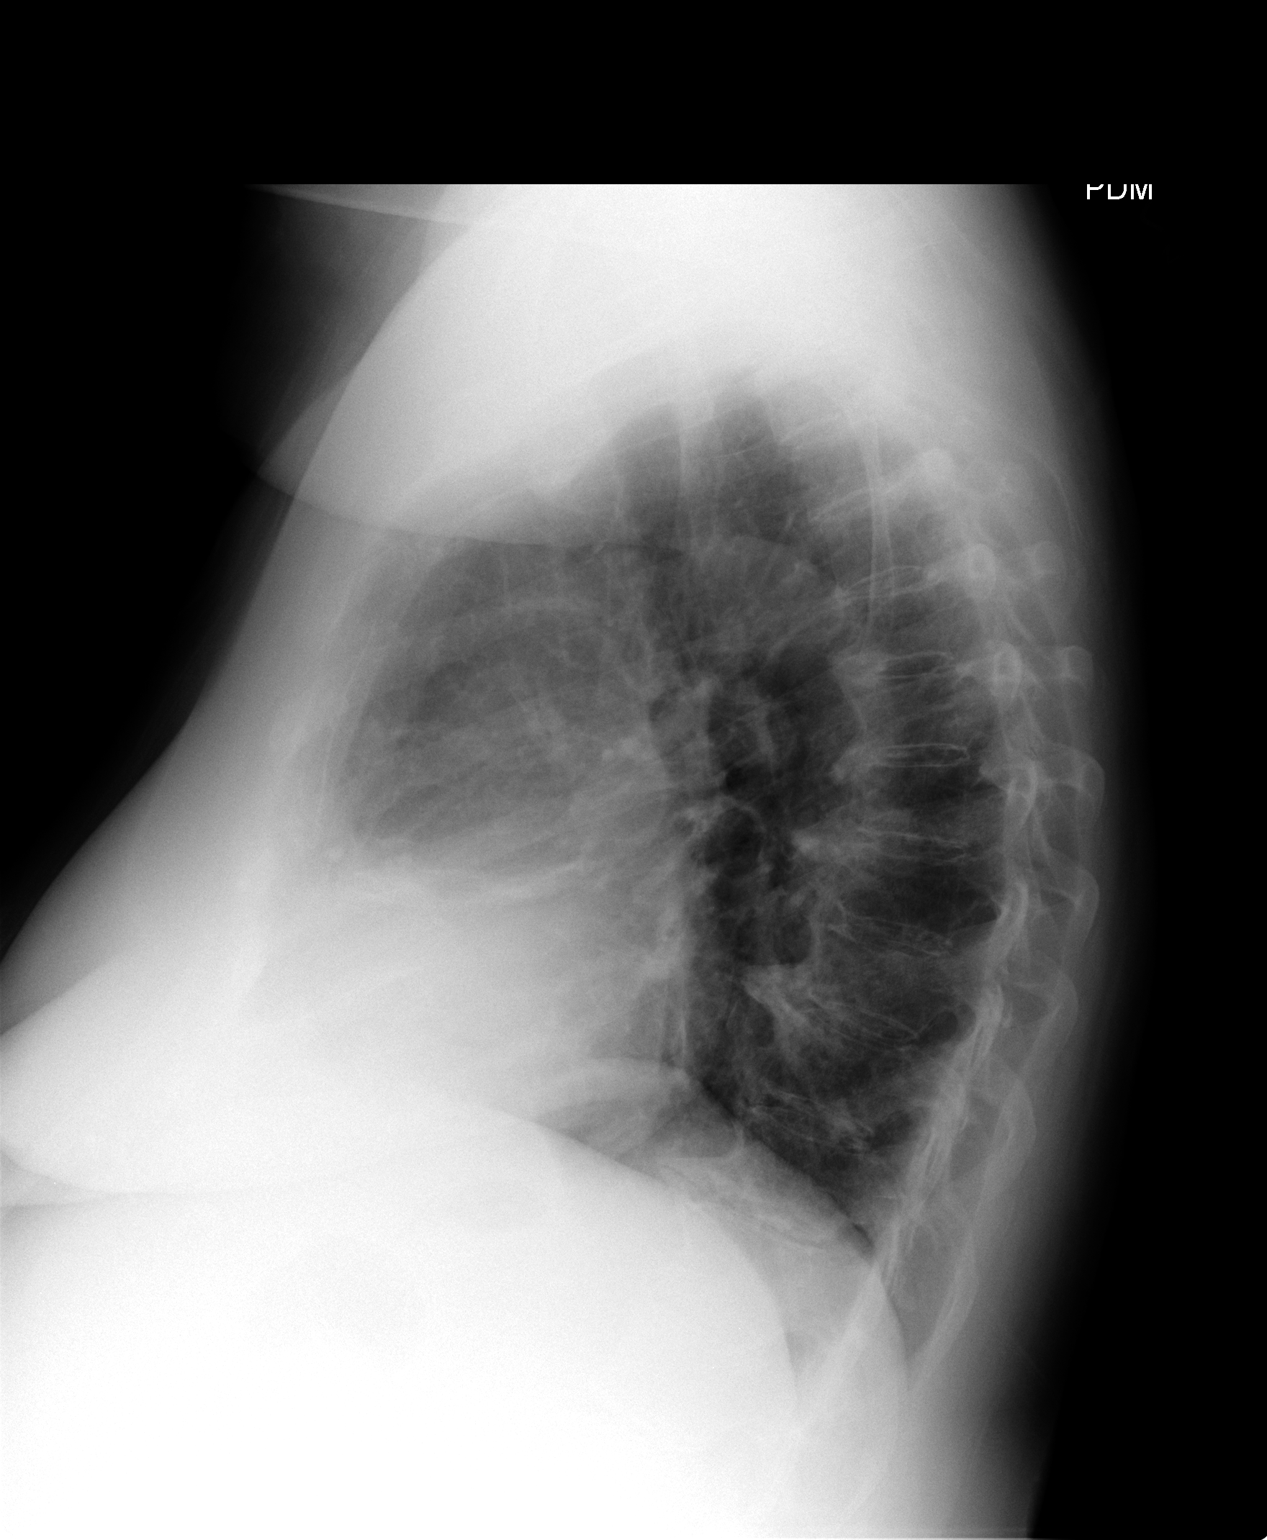

[2 of 2 positions shown; findings below may reference images not displayed]

FINDINGS: The lungs are adequately inflated. Stable scarring in the right
middle lobe is present and stable. There is no alveolar pneumonia.
There is no pleural effusion or pneumothorax. The cardiac silhouette
is top-normal in size. The pulmonary vascularity is not engorged.
The mediastinum is normal in width. The observed portions of the
bony thorax exhibit degenerative change but no acute abnormality.
IMPRESSION: There are chronic changes present as described. There is no evidence
of active cardiopulmonary disease.

## 2014-09-25 ENCOUNTER — Ambulatory Visit (INDEPENDENT_AMBULATORY_CARE_PROVIDER_SITE_OTHER): Payer: Medicare Other | Admitting: Internal Medicine

## 2014-09-25 ENCOUNTER — Other Ambulatory Visit (INDEPENDENT_AMBULATORY_CARE_PROVIDER_SITE_OTHER): Payer: Medicare Other

## 2014-09-25 ENCOUNTER — Encounter: Payer: Self-pay | Admitting: Internal Medicine

## 2014-09-25 VITALS — BP 138/80 | HR 82 | Temp 98.1°F | Resp 15 | Ht 60.0 in | Wt 158.4 lb

## 2014-09-25 DIAGNOSIS — M899 Disorder of bone, unspecified: Secondary | ICD-10-CM | POA: Diagnosis not present

## 2014-09-25 DIAGNOSIS — I10 Essential (primary) hypertension: Secondary | ICD-10-CM

## 2014-09-25 DIAGNOSIS — E781 Pure hyperglyceridemia: Secondary | ICD-10-CM | POA: Diagnosis not present

## 2014-09-25 DIAGNOSIS — R5381 Other malaise: Secondary | ICD-10-CM

## 2014-09-25 DIAGNOSIS — M949 Disorder of cartilage, unspecified: Secondary | ICD-10-CM

## 2014-09-25 DIAGNOSIS — K219 Gastro-esophageal reflux disease without esophagitis: Secondary | ICD-10-CM

## 2014-09-25 DIAGNOSIS — R5383 Other fatigue: Secondary | ICD-10-CM | POA: Diagnosis not present

## 2014-09-25 DIAGNOSIS — F3289 Other specified depressive episodes: Secondary | ICD-10-CM

## 2014-09-25 DIAGNOSIS — D649 Anemia, unspecified: Secondary | ICD-10-CM

## 2014-09-25 DIAGNOSIS — F411 Generalized anxiety disorder: Secondary | ICD-10-CM | POA: Diagnosis not present

## 2014-09-25 DIAGNOSIS — F329 Major depressive disorder, single episode, unspecified: Secondary | ICD-10-CM | POA: Diagnosis not present

## 2014-09-25 LAB — VITAMIN D 25 HYDROXY (VIT D DEFICIENCY, FRACTURES): VITD: 73.27 ng/mL (ref 30.00–100.00)

## 2014-09-25 LAB — LIPID PANEL
Cholesterol: 240 mg/dL — ABNORMAL HIGH (ref 0–200)
HDL: 103.2 mg/dL (ref >39.00)
LDL Cholesterol: 118 mg/dL — ABNORMAL HIGH (ref 0–99)
NonHDL: 136.8
Total CHOL/HDL Ratio: 2
Triglycerides: 95 mg/dL (ref 0.0–149.0)
VLDL: 19 mg/dL (ref 0.0–40.0)

## 2014-09-25 LAB — BASIC METABOLIC PANEL
BUN: 11 mg/dL (ref 6–23)
CO2: 28 mEq/L (ref 19–32)
Calcium: 9.8 mg/dL (ref 8.4–10.5)
Chloride: 101 mEq/L (ref 96–112)
Creatinine, Ser: 0.9 mg/dL (ref 0.4–1.2)
GFR: 64.69 mL/min (ref >60.00)
Glucose, Bld: 107 mg/dL — ABNORMAL HIGH (ref 70–99)
Potassium: 4 mEq/L (ref 3.5–5.1)
Sodium: 136 mEq/L (ref 135–145)

## 2014-09-25 LAB — CBC WITH DIFFERENTIAL/PLATELET
Basophils Absolute: 0 10*3/uL (ref 0.0–0.1)
Basophils Relative: 0.8 % (ref 0.0–3.0)
Eosinophils Absolute: 0.6 10*3/uL (ref 0.0–0.7)
Eosinophils Relative: 9.9 % — ABNORMAL HIGH (ref 0.0–5.0)
HCT: 39.8 % (ref 36.0–46.0)
Hemoglobin: 13.5 g/dL (ref 12.0–15.0)
Lymphocytes Relative: 18.1 % (ref 12.0–46.0)
Lymphs Abs: 1.2 10*3/uL (ref 0.7–4.0)
MCHC: 34 g/dL (ref 30.0–36.0)
MCV: 98.5 fl (ref 78.0–100.0)
Monocytes Absolute: 0.5 10*3/uL (ref 0.1–1.0)
Monocytes Relative: 7.6 % (ref 3.0–12.0)
Neutro Abs: 4.2 10*3/uL (ref 1.4–7.7)
Neutrophils Relative %: 63.6 % (ref 43.0–77.0)
Platelets: 141 10*3/uL — ABNORMAL LOW (ref 150.0–400.0)
RBC: 4.04 Mil/uL (ref 3.87–5.11)
RDW: 14.1 % (ref 11.5–15.5)
WBC: 6.5 10*3/uL (ref 4.0–10.5)

## 2014-09-25 LAB — HEPATIC FUNCTION PANEL
ALT: 43 U/L — ABNORMAL HIGH (ref 0–35)
AST: 57 U/L — ABNORMAL HIGH (ref 0–37)
Albumin: 4.4 g/dL (ref 3.5–5.2)
Alkaline Phosphatase: 57 U/L (ref 39–117)
Bilirubin, Direct: 0.2 mg/dL (ref 0.0–0.3)
Total Bilirubin: 0.8 mg/dL (ref 0.2–1.2)
Total Protein: 7.6 g/dL (ref 6.0–8.3)

## 2014-09-25 LAB — FERRITIN: Ferritin: 693.4 ng/mL — ABNORMAL HIGH (ref 10.0–291.0)

## 2014-09-25 LAB — TSH: TSH: 4.8 u[IU]/mL — ABNORMAL HIGH (ref 0.35–4.50)

## 2014-09-25 MED ORDER — SERTRALINE HCL 50 MG PO TABS: 50.0000 mg | ORAL_TABLET | Freq: Every day | ORAL | Status: DC

## 2014-09-25 MED ORDER — RANITIDINE HCL 150 MG PO TABS: 150.0000 mg | ORAL_TABLET | Freq: Two times a day (BID) | ORAL | Status: DC

## 2014-09-25 MED ORDER — LORAZEPAM 1 MG PO TABS: ORAL_TABLET | ORAL | Status: DC

## 2014-09-25 NOTE — Assessment & Plan Note (Signed)
   A1c 5.3% in May of this year

## 2014-09-25 NOTE — Assessment & Plan Note (Signed)
Improved; she rarely takes lorazepam.  Excess alcohol consumption and associated risk were discussed. She was to minimize lorazepam use. Standard of care of less than 1 alcoholic beverage per day reviewed

## 2014-09-25 NOTE — Progress Notes (Signed)
Pre visit review using our clinic review tool, if applicable. No additional management support is needed unless otherwise documented below in the visit note. 

## 2014-09-25 NOTE — Assessment & Plan Note (Signed)
Ranitidine 150 mg bid due to Nexium cost

## 2014-09-25 NOTE — Assessment & Plan Note (Signed)
Depression is significantly improved. Sertraline will be decreased to 50 mg daily to minimize risk of serotonin syndrome due to drug: drug interactions

## 2014-09-25 NOTE — Patient Instructions (Addendum)
Your next office appointment will be determined based upon review of your pending labs . Those instructions will be transmitted to you through My Chart   Reflux of gastric acid may be asymptomatic as this may occur mainly during sleep.The triggers for reflux  include stress; the "aspirin family" ; alcohol; peppermint; and caffeine (coffee, tea, cola, and chocolate). The aspirin family would include aspirin and the nonsteroidal agents such as ibuprofen &  Naproxen. Tylenol would not cause reflux. If having symptoms ; food & drink should be avoided for @ least 2 hours before going to bed.    Alcohol intake is recommended as less than  1 drink per day for women. Alcohol typically raises  triglycerides significantly.

## 2014-09-25 NOTE — Progress Notes (Signed)
Subjective:    Patient ID: Lisa Larson, female    DOB: 1941-05-08, 73 y.o.   MRN: [id number]  HPI She is here to assess active health issues & conditions. PMH, FH, & Social history verified & updated   She does have indigestion if she misses her PPI.  She has frequent stools but also will have loose stool. Her colonoscopy in July 2014 did reveal polyps.  In May of this year she did have a mild anemia. She has no definite bleeding dyscrasia but describes difficulty stopping bleeding. She also describes delayed healing.  She describes significant fatigue although she feels that her mood has improved dramatically. She rarely takes lorazepam.  She will have 4 drinks or more daily.    Review of Systems Unexplained weight loss, abdominal pain, significant dyspepsia, dysphagia, melena, rectal bleeding, or persistently small caliber stools are denied. Epistaxis, hemoptysis, hematuria, melena, or rectal bleeding denied. No unexplained weight loss, significant dyspepsia,dysphagia, or abdominal pain.  There is no abnormal bruising , bleeding, or difficulty stopping bleeding with injury.     Objective:   Physical Exam  Gen.: Healthy and well-nourished in appearance. Alert, appropriate and cooperative throughout exam. Appears younger than stated age  Head: Normocephalic without obvious abnormalities Eyes: Ptosis of lids.No corneal or conjunctival inflammation noted. Pupils equal round reactive to light and accommodation. Extraocular motion intact.  Ears: External  ear exam reveals no significant lesions or deformities. Canals clear .TMs normal. Hearing is grossly normal bilaterally. Nose: External nasal exam reveals no deformity or inflammation. Nasal mucosa are pink and moist. No lesions or exudates noted.   Mouth: Oral mucosa and oropharynx reveal no lesions or exudates. Upper partial Neck: No deformities, masses, or tenderness noted. Range of motion decreased. Thyroid normal. Lungs:  Normal respiratory effort; chest expands symmetrically. Lungs are clear to auscultation without rales, wheezes, or increased work of breathing. Heart: Normal rate and rhythm. Normal S1 and S2. No gallop, click, or rub. No murmur. Abdomen: Bowel sounds normal; abdomen soft and nontender. No masses, organomegaly or hernias noted. Genitalia: as per Gyn                                  Musculoskeletal/extremities:  Accentuated curvature of thoracic spine. No clubbing, cyanosis, edema, or significant extremity  deformity noted. Range of motion normal .Tone & strength normal. Hand joints reveal marked DJD DIP changes. Fusiform knees with crepitus. Fingernail health good. Able to lie down & sit up w/o help. Negative SLR bilaterally Vascular: Carotid, radial artery, dorsalis pedis and  posterior tibial pulses are equal but DPP decreased. No bruits present. Neurologic: Alert and oriented x3. Deep tendon reflexes symmetrical and normal.  Gait :limp of LLE      Skin: Intact without suspicious lesions or rashes. Lymph: No cervical, axillary lymphadenopathy present. Psych: Mood and affect are normal. Normally interactive                                                                                        Assessment & Plan:  See Current Assessment & Plan in Problem  List under specific Diagnosis

## 2014-09-25 NOTE — Assessment & Plan Note (Signed)
CBC ferritin 

## 2014-09-25 NOTE — Assessment & Plan Note (Signed)
BMET  Vitamin D level TSH

## 2014-09-26 ENCOUNTER — Telehealth: Payer: Self-pay

## 2014-09-26 ENCOUNTER — Other Ambulatory Visit (INDEPENDENT_AMBULATORY_CARE_PROVIDER_SITE_OTHER): Payer: Medicare Other

## 2014-09-26 ENCOUNTER — Other Ambulatory Visit: Payer: Self-pay | Admitting: Internal Medicine

## 2014-09-26 DIAGNOSIS — R7309 Other abnormal glucose: Secondary | ICD-10-CM | POA: Diagnosis not present

## 2014-09-26 DIAGNOSIS — R946 Abnormal results of thyroid function studies: Secondary | ICD-10-CM | POA: Insufficient documentation

## 2014-09-26 DIAGNOSIS — R740 Nonspecific elevation of levels of transaminase and lactic acid dehydrogenase [LDH]: Secondary | ICD-10-CM

## 2014-09-26 DIAGNOSIS — R7401 Elevation of levels of liver transaminase levels: Secondary | ICD-10-CM

## 2014-09-26 DIAGNOSIS — R7402 Elevation of levels of lactic acid dehydrogenase (LDH): Secondary | ICD-10-CM | POA: Insufficient documentation

## 2014-09-26 LAB — HEMOGLOBIN A1C: Hgb A1c MFr Bld: 5.1 % (ref 4.6–6.5)

## 2014-09-26 NOTE — Telephone Encounter (Signed)
Message copied by Shelly Coss on Tue Sep 26, 2014  8:29 AM ------      Message from: Hendricks Limes      Created: Tue Sep 26, 2014  7:02 AM       Please add A1c (790.29)       ------

## 2014-09-26 NOTE — Telephone Encounter (Signed)
Request for add on has been faxed to lab 

## 2014-10-02 ENCOUNTER — Ambulatory Visit (INDEPENDENT_AMBULATORY_CARE_PROVIDER_SITE_OTHER)
Admission: RE | Admit: 2014-10-02 | Discharge: 2014-10-02 | Disposition: A | Discharge status: Home / Self Care | Payer: Medicare Other | Source: Ambulatory Visit | Attending: Pulmonary Disease | Admitting: Pulmonary Disease

## 2014-10-02 ENCOUNTER — Ambulatory Visit (INDEPENDENT_AMBULATORY_CARE_PROVIDER_SITE_OTHER): Payer: Medicare Other | Admitting: Pulmonary Disease

## 2014-10-02 ENCOUNTER — Encounter: Payer: Self-pay | Admitting: Pulmonary Disease

## 2014-10-02 VITALS — BP 118/76 | HR 79 | Temp 97.7°F | Ht 60.0 in | Wt 161.4 lb

## 2014-10-02 DIAGNOSIS — J302 Other seasonal allergic rhinitis: Secondary | ICD-10-CM | POA: Diagnosis not present

## 2014-10-02 DIAGNOSIS — I10 Essential (primary) hypertension: Secondary | ICD-10-CM | POA: Diagnosis not present

## 2014-10-02 DIAGNOSIS — J45998 Other asthma: Secondary | ICD-10-CM | POA: Diagnosis not present

## 2014-10-02 DIAGNOSIS — Z23 Encounter for immunization: Secondary | ICD-10-CM

## 2014-10-02 DIAGNOSIS — J82 Pulmonary eosinophilia, not elsewhere classified: Secondary | ICD-10-CM

## 2014-10-02 DIAGNOSIS — J8281 Chronic eosinophilic pneumonia: Secondary | ICD-10-CM

## 2014-10-02 DIAGNOSIS — J8289 Other pulmonary eosinophilia, not elsewhere classified: Secondary | ICD-10-CM

## 2014-10-02 DIAGNOSIS — J984 Other disorders of lung: Secondary | ICD-10-CM | POA: Diagnosis not present

## 2014-10-02 MED ORDER — ALBUTEROL SULFATE HFA 108 (90 BASE) MCG/ACT IN AERS: 2.0000 | INHALATION_SPRAY | Freq: Four times a day (QID) | RESPIRATORY_TRACT | Status: DC | PRN

## 2014-10-02 NOTE — Patient Instructions (Signed)
Chest xray today Flu shot today Follow up in 3 months

## 2014-10-02 NOTE — Assessment & Plan Note (Signed)
Clinically stable.  Recently lab work showed increased eosinophil percentage, but absolute value was normal.  Will repeat CXR today >> if okay, will then change to 2.5 mg prednisone qod.  Will send script for 5 mg pills that she can then break in half.

## 2014-10-02 NOTE — Progress Notes (Signed)
Chief Complaint  Patient presents with  . Follow-up    Needs refill of Albuterol HFA. Pt c/o incr cough with congestion wth weather change. Denies mucus production. Pt still does nothave coverage for Dulera-problems with insurance. Requests flu shot.    History of Present Illness: Lisa Larson is a 73 y.o. 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  She remains on 2.5 mg prednisone daily.  She denies cough, fever, wheeze, sweats, rash, or gland swelling.  She has notice more runny nose >> she changed to generic cetirizine, but only using 5 mg daily.    She is having trouble getting dulera approved by her insurance.  She was told there are no alternatives that will be covered.   Tests: CBC 01/14/11>>39% eosinophils 01/14/11>> HIV negative, ACE 39, RF negative, ANA 1:40, ANCA negative  CT chest 01/16/11>>Multifocal b/l ASD with GGO BAL 01/21/11>>48% Eosinophils  IgE 02/25/11>>282 CT chest 07/22/11>>resolution of ASD CT chest 10/03/11>>recurrence of nodular infiltrate Rt upper lobe 10/08/11>>resume prednisone 10/08/11 CXR 08/19/12>>no acute findings PFT 11/04/12>>FEV1 1.77 (87%), FEV1% 68, TLC 4.87 (101%), DLCO 69%, +BD from FEF 25-75%. CXR 12/05/13 >> Rt lung scarring, no change  PMHx, PSHx, Medications, Allergies, Fhx, Shx reviewed.  Physical Exam:  General - obese HEENT - no sinus tenderness, no oral exudate, no LAN Chest - no wheeze/rales/dullness Cardiac - s1s2 regular, no murmur Abd - soft, nontender Ext - no edema Neuro - normal strength Skin - no rash Psych - normal mood, behavior  CBC    Component Value Date/Time   WBC 6.5 09/25/2014 1149   RBC 4.04 09/25/2014 1149   HGB 13.5 09/25/2014 1149   HCT 39.8 09/25/2014 1149   PLT 141.0* 09/25/2014 1149   MCV 98.5 09/25/2014 1149   MCHC 34.0 09/25/2014 1149   RDW 14.1 09/25/2014 1149   LYMPHSABS 1.2 09/25/2014 1149   MONOABS 0.5 09/25/2014 1149   EOSABS 0.6 09/25/2014 1149   BASOSABS 0.0 09/25/2014 1149        Assessment/Plan:  [name], MD [place] 10/02/2014, 10:00 AM Pager:  [phone] After 3pm call: [phone]

## 2014-10-02 NOTE — Assessment & Plan Note (Signed)
Explained that typical dose for cetirizine is 10 mg >> she will change to higher dose and see if this improves her runny nose.

## 2014-10-02 NOTE — Assessment & Plan Note (Signed)
She has done well with dulera.  Unfortunately her insurance company is preventing her from continuing this inhaler.  She will try to determine if there is alternative inhaler that she can use.  Sent refill for proair.

## 2014-10-03 ENCOUNTER — Telehealth: Payer: Self-pay | Admitting: Pulmonary Disease

## 2014-10-03 MED ORDER — PREDNISONE 5 MG PO TABS: 2.5000 mg | ORAL_TABLET | ORAL | Status: DC

## 2014-10-03 NOTE — Telephone Encounter (Signed)
Dg Chest 2 View  10/02/2014   CLINICAL DATA:  Eosinophilic pneumonia ; hypertension  EXAM: CHEST  2 VIEW  COMPARISON:  December 05, 2013 and March 28, 2014  FINDINGS: There is scarring in the right lower lobe, stable. There is no edema or consolidation. Heart is upper normal in size with pulmonary vascularity within normal limits. No adenopathy. There is degenerative change in the thoracic spine.  IMPRESSION: Stable right lower lobe scarring. No edema or consolidation. No apparent adenopathy.   Electronically Signed   By: Lowella Grip M.D.   On: 10/02/2014 10:23     Will have my nurse inform pt that CXR looks okay.  No evidence for recurrence of eosinophilic pneumonia.    She can change prednisone to 2.5 mg every other day, and remain on this dose until next follow up.  Please send in new script for prednisone 5 mg pills, take 1/2 pill every other day, dispense 30 pills with 3 refills.

## 2014-10-03 NOTE — Addendum Note (Signed)
Addended by: Virl Cagey on: 10/03/2014 09:08 AM   Modules accepted: Orders

## 2014-10-03 NOTE — Telephone Encounter (Signed)
Results have been explained to patient, pt expressed understanding.  Pt aware that Rx called into pharmacy -- prednisone 5 mg pills, take 1/2 pill every other day, dispense 30 pills  Nothing further needed.

## 2014-10-20 ENCOUNTER — Other Ambulatory Visit: Payer: Self-pay | Admitting: Pulmonary Disease

## 2014-10-24 NOTE — Telephone Encounter (Signed)
Patient requesting refill on Prednisone.  Ok to refill? 

## 2014-10-24 NOTE — Telephone Encounter (Signed)
Send script for prednisone 5 mg pill, 1/2 pill every other day, dispense 30 pills with 3 refills.

## 2014-11-02 ENCOUNTER — Encounter: Payer: Self-pay | Admitting: Internal Medicine

## 2014-11-02 ENCOUNTER — Ambulatory Visit (INDEPENDENT_AMBULATORY_CARE_PROVIDER_SITE_OTHER): Payer: Medicare Other | Admitting: Internal Medicine

## 2014-11-02 ENCOUNTER — Other Ambulatory Visit (INDEPENDENT_AMBULATORY_CARE_PROVIDER_SITE_OTHER): Payer: Medicare Other

## 2014-11-02 VITALS — BP 120/80 | HR 87 | Temp 98.0°F | Resp 14 | Wt 156.2 lb

## 2014-11-02 DIAGNOSIS — E785 Hyperlipidemia, unspecified: Secondary | ICD-10-CM | POA: Diagnosis not present

## 2014-11-02 DIAGNOSIS — F419 Anxiety disorder, unspecified: Secondary | ICD-10-CM | POA: Diagnosis not present

## 2014-11-02 DIAGNOSIS — R7402 Elevation of levels of lactic acid dehydrogenase (LDH): Secondary | ICD-10-CM

## 2014-11-02 DIAGNOSIS — E119 Type 2 diabetes mellitus without complications: Secondary | ICD-10-CM

## 2014-11-02 DIAGNOSIS — R740 Nonspecific elevation of levels of transaminase and lactic acid dehydrogenase [LDH]: Secondary | ICD-10-CM | POA: Diagnosis not present

## 2014-11-02 DIAGNOSIS — R748 Abnormal levels of other serum enzymes: Secondary | ICD-10-CM

## 2014-11-02 DIAGNOSIS — K589 Irritable bowel syndrome without diarrhea: Secondary | ICD-10-CM | POA: Diagnosis not present

## 2014-11-02 DIAGNOSIS — R7401 Elevation of levels of liver transaminase levels: Secondary | ICD-10-CM

## 2014-11-02 DIAGNOSIS — R946 Abnormal results of thyroid function studies: Secondary | ICD-10-CM

## 2014-11-02 DIAGNOSIS — K219 Gastro-esophageal reflux disease without esophagitis: Secondary | ICD-10-CM

## 2014-11-02 LAB — LIPID PANEL
Cholesterol: 197 mg/dL (ref 0–200)
HDL: 45.1 mg/dL (ref >39.00)
LDL Cholesterol: 127 mg/dL — ABNORMAL HIGH (ref 0–99)
NonHDL: 151.9
Total CHOL/HDL Ratio: 4
Triglycerides: 123 mg/dL (ref 0.0–149.0)
VLDL: 24.6 mg/dL (ref 0.0–40.0)

## 2014-11-02 LAB — T4, FREE: Free T4: 0.77 ng/dL (ref 0.60–1.60)

## 2014-11-02 LAB — ALT: ALT: 21 U/L (ref 0–35)

## 2014-11-02 LAB — AST: AST: 35 U/L (ref 0–37)

## 2014-11-02 LAB — HEPATIC FUNCTION PANEL
ALT: 21 U/L (ref 0–35)
AST: 35 U/L (ref 0–37)
Albumin: 3.6 g/dL (ref 3.5–5.2)
Alkaline Phosphatase: 59 U/L (ref 39–117)
Bilirubin, Direct: 0.2 mg/dL (ref 0.0–0.3)
Total Bilirubin: 0.8 mg/dL (ref 0.2–1.2)
Total Protein: 7.5 g/dL (ref 6.0–8.3)

## 2014-11-02 LAB — TSH: TSH: 2.67 u[IU]/mL (ref 0.35–4.50)

## 2014-11-02 MED ORDER — OMEPRAZOLE 20 MG PO CPDR: 20.0000 mg | DELAYED_RELEASE_CAPSULE | Freq: Every day | ORAL | Status: DC

## 2014-11-02 NOTE — Progress Notes (Signed)
Pre visit review using our clinic review tool, if applicable. No additional management support is needed unless otherwise documented below in the visit note. 

## 2014-11-02 NOTE — Patient Instructions (Signed)
Please continue a probiotic [employer], Florastor OR Align, every day . This will replace the normal bacteria which  are necessary for formation of normal stool and processing of food.  Reflux of gastric acid may be asymptomatic as this may occur mainly during sleep.The triggers for reflux  include stress; the "aspirin family" ; alcohol; peppermint; and caffeine (coffee, tea, cola, and chocolate). The aspirin family would include aspirin and the nonsteroidal agents such as ibuprofen &  Naproxen. Tylenol would not cause reflux. If having symptoms ; food & drink should be avoided for @ least 2 hours before going to bed.

## 2014-11-02 NOTE — Progress Notes (Signed)
   Subjective:    Patient ID: Lisa Larson, female    DOB: May 20, 1941, 73 y.o.   MRN: [id number]  HPI   She was unable to tolerate the ranitidine due to itching and increased abdominal discomfort. She also believed that it caused constipation and loose to watery stools.  An OTC Probiotic has been of benefit.  She switched to omeprazole over the weekend 10/31-11/1 with marked decrease in the frequency of the symptoms. She still has some loose stool. She describes the sensation of "rocks in my stomach" when stressed. She asked about increasing Lorazepam; I discussed risks as per Dr Elliot Cousin group, especially with alcohol.  She tried dicyclomine prescribed by the GI PA. She felt this affected her hearing.  Her last colonoscopy revealed polyps in 2013. She is to have this repeated in 2018.  She does have some decreased caliber of the stool but it's not persistently pencil thin.  She is concerned about some elevation of LFTs. Possible role of alcohol discussed. She was also under the impression she was anemic; labs reviewed & copy provided.   Review of Systems Unexplained weight loss, abdominal pain, significant dyspepsia, dysphagia, melena, rectal bleeding, or persistently small caliber stools are denied.     Objective:   Physical Exam    Positive or pertinent findings include: She has bilateral ptosis. There is slight exotropia of the left eye. There is slight tenting of the skin. Pedal pulses are decreased, especially the dorsalis pedis pulses. She has mixed arthritic changes in the hands. She becomes emotional intermittently with slight tearing when she discusses family stress.  General appearance :adequately nourished; in no distress. Eyes: No conjunctival inflammation or scleral icterus is present. Oral exam: Dental hygiene is good. Lips and gums are healthy appearing.There is no oropharyngeal erythema or exudate noted.  Heart:  Normal rate and regular rhythm. S1 and S2  normal without gallop, murmur, click, rub or other extra sounds   Lungs:Chest surprisingly clear; no wheezes, rhonchi,rales ,or rubs present.No increased work of breathing.  Abdomen: bowel sounds normal, soft and non-tender without masses, organomegaly or hernias noted.  No guarding or rebound.  Vascular : all pulses equal ; no bruits present. Skin:Warm & dry.  Intact without suspicious lesions or rashes ; no jaundice  Lymphatic: No lymphadenopathy is noted about the head, neck, axilla          Assessment & Plan:  #1 GERD #2IBS #3 elevated LFTs #4 anxiety See orders

## 2014-11-03 ENCOUNTER — Encounter: Payer: Self-pay | Admitting: Internal Medicine

## 2014-11-03 ENCOUNTER — Telehealth: Payer: Self-pay | Admitting: Internal Medicine

## 2014-11-03 NOTE — Telephone Encounter (Signed)
Patient would like a copy of her recent lab results mailed to her home address. She was unable to find Dr. Clayborn Heron comments on MyChart.

## 2014-11-06 NOTE — Telephone Encounter (Signed)
Result have been mailed

## 2014-11-10 ENCOUNTER — Telehealth: Payer: Self-pay | Admitting: Internal Medicine

## 2014-11-10 NOTE — Telephone Encounter (Signed)
Called pt no answer x's 10 rings.../lmb 

## 2014-11-10 NOTE — Telephone Encounter (Signed)
Pt request phone call concern about lab result that she got from my chart. Need clarification on this.

## 2014-11-10 NOTE — Telephone Encounter (Signed)
Pt return call back she stated on her lab report md had diabetes on her sheet. She states she was never told that she was a diabetic. Inform pt that at one time back in 2011 she had some abn glucose. They have been monitoring and for past couple years her A1C has been normal. MD check back in sept it was 5.1 not considered a diabetic...Lisa Larson

## 2014-11-20 ENCOUNTER — Telehealth: Payer: Self-pay | Admitting: Pulmonary Disease

## 2014-11-20 NOTE — Telephone Encounter (Signed)
Called and spoke to pt. Pt stated she was informed by her insurance that aceytlcysteine would be used in replacement of the dulera and would be cheaper. Pt is currently paying $250 per month. Advised pt that it does not seem accurate to have aceytlcysteine as an alternative and that I would call to see if there are any other medications of a different tier that would be cheaper. Called [facility] [phone]) and was advised there aren't any covered alternatives that they are aware of.   VS please advise if we are able to prescribe a similar medication to dulera and can check if covered.

## 2014-11-21 ENCOUNTER — Telehealth: Payer: Self-pay | Admitting: Pulmonary Disease

## 2014-11-21 MED ORDER — BUDESONIDE-FORMOTEROL FUMARATE 160-4.5 MCG/ACT IN AERO: 2.0000 | INHALATION_SPRAY | Freq: Two times a day (BID) | RESPIRATORY_TRACT | Status: DC

## 2014-11-21 MED ORDER — MOMETASONE FURO-FORMOTEROL FUM 100-5 MCG/ACT IN AERO: 2.0000 | INHALATION_SPRAY | Freq: Two times a day (BID) | RESPIRATORY_TRACT | Status: DC

## 2014-11-21 NOTE — Telephone Encounter (Signed)
Alternatives to dulera would be advair, symbicort, or breo.

## 2014-11-21 NOTE — Telephone Encounter (Signed)
Called and spoke to pt. Informed pt of the recs per VS. Rx sent to preferred pharmacy. Pt verbalized understanding and denied any further questions or concerns at this time.   

## 2014-11-21 NOTE — Telephone Encounter (Signed)
Called and spoke to pt. Pt stated she called [facility] and the Advair and symbicort are a tier 3 med that are both $35/month. Pt stated even with filling Dulera at [facility] it is still $195/month and she is unable to pay for it.   VS please advise if pt can change to advair or symbicort.

## 2014-11-21 NOTE — Telephone Encounter (Signed)
Pt returned call & can be reached at [phone].  Lisa Larson

## 2014-11-21 NOTE — Telephone Encounter (Signed)
Called and spoke with pt and she is aware of refills at the pharmacy.  Pt will call and get this refilled.  Nothing further is needed.

## 2014-11-21 NOTE — Telephone Encounter (Signed)
Called and spoke to pt. Informed pt of the alternatives. Pt stated she will call [facility] and see if they are covered and if cheaper than dulera. Pt also stated to send a script into [facility] instead of [facility] d/t her insurance covering more of the med if filled at [facility]. Pt stated she will call back if she wants to stay with [facility] or switch to a cheaper alternative.

## 2014-11-21 NOTE — Telephone Encounter (Signed)
Please change to symbicort 160/4.5 two puffs bid.  Dispense 1 inhaler with 5 refills.

## 2014-11-29 ENCOUNTER — Telehealth: Payer: Self-pay | Admitting: Pulmonary Disease

## 2014-11-29 MED ORDER — MOMETASONE FURO-FORMOTEROL FUM 100-5 MCG/ACT IN AERO: 2.0000 | INHALATION_SPRAY | Freq: Two times a day (BID) | RESPIRATORY_TRACT | Status: DC

## 2014-11-29 NOTE — Telephone Encounter (Signed)
Pt aware that Dulera samples are up front to be picked up --- Dulera 100-5, 30 day supply Pt states she is unable to get this through her insurance until after Jan 1 d/t it not being covered. Pt states that her insurance company faxed a form and it was never sent back. I explained to the patient that we more than likely did not receive this fax and in the future, if she is aware of something being faxed to our office that it time sensitive and needs immediate attention to let me know and if not received we can request it be faxed again. Explained to the patient that sometimes faxes do not come through the line. Apologized for the inconvenience. Nothing further needed.

## 2014-12-10 ENCOUNTER — Emergency Department (HOSPITAL_COMMUNITY)
Admission: EM | Admit: 2014-12-10 | Discharge: 2014-12-10 | Disposition: A | Discharge status: Home / Self Care | Payer: Medicare Other | Attending: Emergency Medicine | Admitting: Emergency Medicine

## 2014-12-10 ENCOUNTER — Encounter (HOSPITAL_COMMUNITY): Payer: Self-pay | Admitting: *Deleted

## 2014-12-10 ENCOUNTER — Emergency Department (HOSPITAL_COMMUNITY): Payer: Medicare Other

## 2014-12-10 DIAGNOSIS — S199XXA Unspecified injury of neck, initial encounter: Secondary | ICD-10-CM | POA: Diagnosis present

## 2014-12-10 DIAGNOSIS — Z79899 Other long term (current) drug therapy: Secondary | ICD-10-CM | POA: Diagnosis not present

## 2014-12-10 DIAGNOSIS — F419 Anxiety disorder, unspecified: Secondary | ICD-10-CM | POA: Insufficient documentation

## 2014-12-10 DIAGNOSIS — M8580 Other specified disorders of bone density and structure, unspecified site: Secondary | ICD-10-CM | POA: Insufficient documentation

## 2014-12-10 DIAGNOSIS — Z8701 Personal history of pneumonia (recurrent): Secondary | ICD-10-CM | POA: Insufficient documentation

## 2014-12-10 DIAGNOSIS — Y998 Other external cause status: Secondary | ICD-10-CM | POA: Insufficient documentation

## 2014-12-10 DIAGNOSIS — Z8781 Personal history of (healed) traumatic fracture: Secondary | ICD-10-CM | POA: Diagnosis not present

## 2014-12-10 DIAGNOSIS — F329 Major depressive disorder, single episode, unspecified: Secondary | ICD-10-CM | POA: Diagnosis not present

## 2014-12-10 DIAGNOSIS — F0781 Postconcussional syndrome: Secondary | ICD-10-CM | POA: Diagnosis not present

## 2014-12-10 DIAGNOSIS — Y9389 Activity, other specified: Secondary | ICD-10-CM | POA: Insufficient documentation

## 2014-12-10 DIAGNOSIS — M545 Low back pain: Secondary | ICD-10-CM | POA: Diagnosis not present

## 2014-12-10 DIAGNOSIS — S3992XA Unspecified injury of lower back, initial encounter: Secondary | ICD-10-CM | POA: Diagnosis not present

## 2014-12-10 DIAGNOSIS — M1990 Unspecified osteoarthritis, unspecified site: Secondary | ICD-10-CM | POA: Diagnosis not present

## 2014-12-10 DIAGNOSIS — K219 Gastro-esophageal reflux disease without esophagitis: Secondary | ICD-10-CM | POA: Insufficient documentation

## 2014-12-10 DIAGNOSIS — S3993XA Unspecified injury of pelvis, initial encounter: Secondary | ICD-10-CM | POA: Diagnosis not present

## 2014-12-10 DIAGNOSIS — Z862 Personal history of diseases of the blood and blood-forming organs and certain disorders involving the immune mechanism: Secondary | ICD-10-CM | POA: Diagnosis not present

## 2014-12-10 DIAGNOSIS — Z87891 Personal history of nicotine dependence: Secondary | ICD-10-CM | POA: Diagnosis not present

## 2014-12-10 DIAGNOSIS — M533 Sacrococcygeal disorders, not elsewhere classified: Secondary | ICD-10-CM | POA: Diagnosis not present

## 2014-12-10 DIAGNOSIS — Z8639 Personal history of other endocrine, nutritional and metabolic disease: Secondary | ICD-10-CM | POA: Insufficient documentation

## 2014-12-10 DIAGNOSIS — Y92410 Unspecified street and highway as the place of occurrence of the external cause: Secondary | ICD-10-CM | POA: Diagnosis not present

## 2014-12-10 DIAGNOSIS — I10 Essential (primary) hypertension: Secondary | ICD-10-CM | POA: Diagnosis not present

## 2014-12-10 DIAGNOSIS — M542 Cervicalgia: Secondary | ICD-10-CM | POA: Diagnosis not present

## 2014-12-10 DIAGNOSIS — S161XXA Strain of muscle, fascia and tendon at neck level, initial encounter: Secondary | ICD-10-CM | POA: Diagnosis not present

## 2014-12-10 DIAGNOSIS — M25551 Pain in right hip: Secondary | ICD-10-CM | POA: Diagnosis not present

## 2014-12-10 DIAGNOSIS — J449 Chronic obstructive pulmonary disease, unspecified: Secondary | ICD-10-CM | POA: Diagnosis not present

## 2014-12-10 MED ORDER — IBUPROFEN 400 MG PO TABS
400.0000 mg | ORAL_TABLET | Freq: Four times a day (QID) | ORAL | Status: DC | PRN
  Administered 2014-12-10: 400 mg via ORAL
  Filled 2014-12-10: qty 2

## 2014-12-10 MED ORDER — CYCLOBENZAPRINE HCL 10 MG PO TABS
10.0000 mg | ORAL_TABLET | Freq: Once | ORAL | Status: AC
  Administered 2014-12-10: 10 mg via ORAL
  Filled 2014-12-10: qty 1

## 2014-12-10 MED ORDER — PREDNISONE 50 MG PO TABS: 50.0000 mg | ORAL_TABLET | Freq: Every day | ORAL | Status: DC

## 2014-12-10 MED ORDER — IBUPROFEN 400 MG PO TABS: 400.0000 mg | ORAL_TABLET | Freq: Four times a day (QID) | ORAL | Status: DC | PRN

## 2014-12-10 NOTE — ED Notes (Signed)
Pt ambulates with cane. Stable and steady. alertx4
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
## 2014-12-10 NOTE — ED Notes (Signed)
MD Nanavati at the bedside  
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
## 2014-12-10 NOTE — Discharge Instructions (Signed)
We saw you in the ER for the neck pain, back pain, leg pain. Our imaging is normal in the ER. The CT scan doesn't show any fracture. We suspect that there might be contusion from the trauma and swelling as well, which might be causing the nerve compression. We also think that you are having a cervical sprain/spasms, from a whip lash type injury. Please see your primary care doctor. ICE and HEAT the area of pain.   Cervical Sprain A cervical sprain is an injury in the neck in which the strong, fibrous tissues (ligaments) that connect your neck bones stretch or tear. Cervical sprains can range from mild to severe. Severe cervical sprains can cause the neck vertebrae to be unstable. This can lead to damage of the spinal cord and can result in serious nervous system problems. The amount of time it takes for a cervical sprain to get better depends on the cause and extent of the injury. Most cervical sprains heal in 1 to 3 weeks. CAUSES  Severe cervical sprains may be caused by:   Contact sport injuries (such as from football, rugby, wrestling, hockey, auto racing, gymnastics, diving, martial arts, or boxing).   Motor vehicle collisions.   Whiplash injuries. This is an injury from a sudden forward and backward whipping movement of the head and neck.  Falls.  Mild cervical sprains may be caused by:   Being in an awkward position, such as while cradling a telephone between your ear and shoulder.   Sitting in a chair that does not offer proper support.   Working at a poorly Landscape architect station.   Looking up or down for long periods of time.  SYMPTOMS   Pain, soreness, stiffness, or a burning sensation in the front, back, or sides of the neck. This discomfort may develop immediately after the injury or slowly, 24 hours or more after the injury.   Pain or tenderness directly in the middle of the back of the neck.   Shoulder or upper back pain.   Limited ability to move the  neck.   Headache.   Dizziness.   Weakness, numbness, or tingling in the hands or arms.   Muscle spasms.   Difficulty swallowing or chewing.   Tenderness and swelling of the neck.  DIAGNOSIS  Most of the time your health care provider can diagnose a cervical sprain by taking your history and doing a physical exam. Your health care provider will ask about previous neck injuries and any known neck problems, such as arthritis in the neck. X-rays may be taken to find out if there are any other problems, such as with the bones of the neck. Other tests, such as a CT scan or MRI, may also be needed.  TREATMENT  Treatment depends on the severity of the cervical sprain. Mild sprains can be treated with rest, keeping the neck in place (immobilization), and pain medicines. Severe cervical sprains are immediately immobilized. Further treatment is done to help with pain, muscle spasms, and other symptoms and may include:  Medicines, such as pain relievers, numbing medicines, or muscle relaxants.   Physical therapy. This may involve stretching exercises, strengthening exercises, and posture training. Exercises and improved posture can help stabilize the neck, strengthen muscles, and help stop symptoms from returning.  HOME CARE INSTRUCTIONS   Put ice on the injured area.   Put ice in a plastic bag.   Place a towel between your skin and the bag.   Leave the ice on  for 15-20 minutes, 3-4 times a day.   If your injury was severe, you may have been given a cervical collar to wear. A cervical collar is a two-piece collar designed to keep your neck from moving while it heals.  Do not remove the collar unless instructed by your health care provider.  If you have long hair, keep it outside of the collar.  Ask your health care provider before making any adjustments to your collar. Minor adjustments may be required over time to improve comfort and reduce pressure on your chin or on the back  of your head.  Ifyou are allowed to remove the collar for cleaning or bathing, follow your health care provider's instructions on how to do so safely.  Keep your collar clean by wiping it with mild soap and water and drying it completely. If the collar you have been given includes removable pads, remove them every 1-2 days and hand wash them with soap and water. Allow them to air dry. They should be completely dry before you wear them in the collar.  If you are allowed to remove the collar for cleaning and bathing, wash and dry the skin of your neck. Check your skin for irritation or sores. If you see any, tell your health care provider.  Do not drive while wearing the collar.   Only take over-the-counter or prescription medicines for pain, discomfort, or fever as directed by your health care provider.   Keep all follow-up appointments as directed by your health care provider.   Keep all physical therapy appointments as directed by your health care provider.   Make any needed adjustments to your workstation to promote good posture.   Avoid positions and activities that make your symptoms worse.   Warm up and stretch before being active to help prevent problems.  SEEK MEDICAL CARE IF:   Your pain is not controlled with medicine.   You are unable to decrease your pain medicine over time as planned.   Your activity level is not improving as expected.  SEEK IMMEDIATE MEDICAL CARE IF:   You develop any bleeding.  You develop stomach upset.  You have signs of an allergic reaction to your medicine.   Your symptoms get worse.   You develop new, unexplained symptoms.   You have numbness, tingling, weakness, or paralysis in any part of your body.  MAKE SURE YOU:   Understand these instructions.  Will watch your condition.  Will get help right away if you are not doing well or get worse. Document Released: 10/12/2007 Document Revised: 12/20/2013 Document Reviewed:  06/22/2013 [facility] Patient Information 2015 [place], [place]. This information is not intended to replace advice given to you by your health care provider. Make sure you discuss any questions you have with your health care provider.  Concussion A concussion, or closed-head injury, is a brain injury caused by a direct blow to the head or by a quick and sudden movement (jolt) of the head or neck. Concussions are usually not life-threatening. Even so, the effects of a concussion can be serious. If you have had a concussion before, you are more likely to experience concussion-like symptoms after a direct blow to the head.  CAUSES  Direct blow to the head, such as from running into another player during a soccer game, being hit in a fight, or hitting your head on a hard surface.  A jolt of the head or neck that causes the brain to move back and forth inside  the skull, such as in a car crash. SIGNS AND SYMPTOMS The signs of a concussion can be hard to notice. Early on, they may be missed by you, family members, and health care providers. You may look fine but act or feel differently. Symptoms are usually temporary, but they may last for days, weeks, or even longer. Some symptoms may appear right away while others may not show up for hours or days. Every head injury is different. Symptoms include:  Mild to moderate headaches that will not go away.  A feeling of pressure inside your head.  Having more trouble than usual:  Learning or remembering things you have heard.  Answering questions.  Paying attention or concentrating.  Organizing daily tasks.  Making decisions and solving problems.  Slowness in thinking, acting or reacting, speaking, or reading.  Getting lost or being easily confused.  Feeling tired all the time or lacking energy (fatigued).  Feeling drowsy.  Sleep disturbances.  Sleeping more than usual.  Sleeping less than usual.  Trouble falling asleep.  Trouble  sleeping (insomnia).  Loss of balance or feeling lightheaded or dizzy.  Nausea or vomiting.  Numbness or tingling.  Increased sensitivity to:  Sounds.  Lights.  Distractions.  Vision problems or eyes that tire easily.  Diminished sense of taste or smell.  Ringing in the ears.  Mood changes such as feeling sad or anxious.  Becoming easily irritated or angry for little or no reason.  Lack of motivation.  Seeing or hearing things other people do not see or hear (hallucinations). DIAGNOSIS Your health care provider can usually diagnose a concussion based on a description of your injury and symptoms. He or she will ask whether you passed out (lost consciousness) and whether you are having trouble remembering events that happened right before and during your injury. Your evaluation might include:  A brain scan to look for signs of injury to the brain. Even if the test shows no injury, you may still have a concussion.  Blood tests to be sure other problems are not present. TREATMENT  Concussions are usually treated in an emergency department, in urgent care, or at a clinic. You may need to stay in the hospital overnight for further treatment.  Tell your health care provider if you are taking any medicines, including prescription medicines, over-the-counter medicines, and natural remedies. Some medicines, such as blood thinners (anticoagulants) and aspirin, may increase the chance of complications. Also tell your health care provider whether you have had alcohol or are taking illegal drugs. This information may affect treatment.  Your health care provider will send you home with important instructions to follow.  How fast you will recover from a concussion depends on many factors. These factors include how severe your concussion is, what part of your brain was injured, your age, and how healthy you were before the concussion.  Most people with mild injuries recover fully.  Recovery can take time. In general, recovery is slower in older persons. Also, persons who have had a concussion in the past or have other medical problems may find that it takes longer to recover from their current injury. HOME CARE INSTRUCTIONS General Instructions  Carefully follow the directions your health care provider gave you.  Only take over-the-counter or prescription medicines for pain, discomfort, or fever as directed by your health care provider.  Take only those medicines that your health care provider has approved.  Do not drink alcohol until your health care provider says you are well  enough to do so. Alcohol and certain other drugs may slow your recovery and can put you at risk of further injury.  If it is harder than usual to remember things, write them down.  If you are easily distracted, try to do one thing at a time. For example, do not try to watch TV while fixing dinner.  Talk with family members or close friends when making important decisions.  Keep all follow-up appointments. Repeated evaluation of your symptoms is recommended for your recovery.  Watch your symptoms and tell others to do the same. Complications sometimes occur after a concussion. Older adults with a brain injury may have a higher risk of serious complications, such as a blood clot on the brain.  Tell your teachers, school nurse, school counselor, coach, athletic trainer, or work Freight forwarder about your injury, symptoms, and restrictions. Tell them about what you can or cannot do. They should watch for:  Increased problems with attention or concentration.  Increased difficulty remembering or learning new information.  Increased time needed to complete tasks or assignments.  Increased irritability or decreased ability to cope with stress.  Increased symptoms.  Rest. Rest helps the brain to heal. Make sure you:  Get plenty of sleep at night. Avoid staying up late at night.  Keep the same  bedtime hours on weekends and weekdays.  Rest during the day. Take daytime naps or rest breaks when you feel tired.  Limit activities that require a lot of thought or concentration. These include:  Doing homework or job-related work.  Watching TV.  Working on the computer.  Avoid any situation where there is potential for another head injury (football, hockey, soccer, basketball, martial arts, downhill snow sports and horseback riding). Your condition will get worse every time you experience a concussion. You should avoid these activities until you are evaluated by the appropriate follow-up health care providers. Returning To Your Regular Activities You will need to return to your normal activities slowly, not all at once. You must give your body and brain enough time for recovery.  Do not return to sports or other athletic activities until your health care provider tells you it is safe to do so.  Ask your health care provider when you can drive, ride a bicycle, or operate heavy machinery. Your ability to react may be slower after a brain injury. Never do these activities if you are dizzy.  Ask your health care provider about when you can return to work or school. Preventing Another Concussion It is very important to avoid another brain injury, especially before you have recovered. In rare cases, another injury can lead to permanent brain damage, brain swelling, or death. The risk of this is greatest during the first 7-10 days after a head injury. Avoid injuries by:  Wearing a seat belt when riding in a car.  Drinking alcohol only in moderation.  Wearing a helmet when biking, skiing, skateboarding, skating, or doing similar activities.  Avoiding activities that could lead to a second concussion, such as contact or recreational sports, until your health care provider says it is okay.  Taking safety measures in your home.  Remove clutter and tripping hazards from floors and  stairways.  Use grab bars in bathrooms and handrails by stairs.  Place non-slip mats on floors and in bathtubs.  Improve lighting in dim areas. SEEK MEDICAL CARE IF:  You have increased problems paying attention or concentrating.  You have increased difficulty remembering or learning new information.  You  need more time to complete tasks or assignments than before.  You have increased irritability or decreased ability to cope with stress.  You have more symptoms than before. Seek medical care if you have any of the following symptoms for more than 2 weeks after your injury:  Lasting (chronic) headaches.  Dizziness or balance problems.  Nausea.  Vision problems.  Increased sensitivity to noise or light.  Depression or mood swings.  Anxiety or irritability.  Memory problems.  Difficulty concentrating or paying attention.  Sleep problems.  Feeling tired all the time. SEEK IMMEDIATE MEDICAL CARE IF:  You have severe or worsening headaches. These may be a sign of a blood clot in the brain.  You have weakness (even if only in one hand, leg, or part of the face).  You have numbness.  You have decreased coordination.  You vomit repeatedly.  You have increased sleepiness.  One pupil is larger than the other.  You have convulsions.  You have slurred speech.  You have increased confusion. This may be a sign of a blood clot in the brain.  You have increased restlessness, agitation, or irritability.  You are unable to recognize people or places.  You have neck pain.  It is difficult to wake you up.  You have unusual behavior changes.  You lose consciousness. MAKE SURE YOU:  Understand these instructions.  Will watch your condition.  Will get help right away if you are not doing well or get worse. Document Released: 03/06/2004 Document Revised: 12/20/2013 Document Reviewed: 07/07/2013 [facility] Patient Information 2015 [place], [place]. This  information is not intended to replace advice given to you by your health care provider. Make sure you discuss any questions you have with your health care provider. Contusion A contusion is a deep bruise. Contusions are the result of an injury that caused bleeding under the skin. The contusion may turn blue, purple, or yellow. Minor injuries will give you a painless contusion, but more severe contusions may stay painful and swollen for a few weeks.  CAUSES  A contusion is usually caused by a blow, trauma, or direct force to an area of the body. SYMPTOMS   Swelling and redness of the injured area.  Bruising of the injured area.  Tenderness and soreness of the injured area.  Pain. DIAGNOSIS  The diagnosis can be made by taking a history and physical exam. An X-ray, CT scan, or MRI may be needed to determine if there were any associated injuries, such as fractures. TREATMENT  Specific treatment will depend on what area of the body was injured. In general, the best treatment for a contusion is resting, icing, elevating, and applying cold compresses to the injured area. Over-the-counter medicines may also be recommended for pain control. Ask your caregiver what the best treatment is for your contusion. HOME CARE INSTRUCTIONS   Put ice on the injured area.  Put ice in a plastic bag.  Place a towel between your skin and the bag.  Leave the ice on for 15-20 minutes, 3-4 times a day, or as directed by your health care provider.  Only take over-the-counter or prescription medicines for pain, discomfort, or fever as directed by your caregiver. Your caregiver may recommend avoiding anti-inflammatory medicines (aspirin, ibuprofen, and naproxen) for 48 hours because these medicines may increase bruising.  Rest the injured area.  If possible, elevate the injured area to reduce swelling. SEEK IMMEDIATE MEDICAL CARE IF:   You have increased bruising or swelling.  You have  pain that is getting  worse.  Your swelling or pain is not relieved with medicines. MAKE SURE YOU:   Understand these instructions.  Will watch your condition.  Will get help right away if you are not doing well or get worse. Document Released: 09/24/2005 Document Revised: 12/20/2013 Document Reviewed: 10/20/2011 [facility] Patient Information 2015 [place], [place]. This information is not intended to replace advice given to you by your health care provider. Make sure you discuss any questions you have with your health care provider. Lumbosacral Radiculopathy Lumbosacral radiculopathy is a pinched nerve or nerves in the low back (lumbosacral area). When this happens you may have weakness in your legs and may not be able to stand on your toes. You may have pain going down into your legs. There may be difficulties with walking normally. There are many causes of this problem. Sometimes this may happen from an injury, or simply from arthritis or boney problems. It may also be caused by other illnesses such as diabetes. If there is no improvement after treatment, further studies may be done to find the exact cause. DIAGNOSIS  X-rays may be needed if the problems become long standing. Electromyograms may be done. This study is one in which the working of nerves and muscles is studied. HOME CARE INSTRUCTIONS   Applications of ice packs may be helpful. Ice can be used in a plastic bag with a towel around it to prevent frostbite to skin. This may be used every 2 hours for 20 to 30 minutes, or as needed, while awake, or as directed by your caregiver.  Only take over-the-counter or prescription medicines for pain, discomfort, or fever as directed by your caregiver.  If physical therapy was prescribed, follow your caregiver's directions. SEEK IMMEDIATE MEDICAL CARE IF:   You have pain not controlled with medications.  You seem to be getting worse rather than better.  You develop increasing weakness in your legs.  You  develop loss of bowel or bladder control.  You have difficulty with walking or balance, or develop clumsiness in the use of your legs.  You have a fever. MAKE SURE YOU:   Understand these instructions.  Will watch your condition.  Will get help right away if you are not doing well or get worse. Document Released: 12/15/2005 Document Revised: 03/08/2012 Document Reviewed: 08/04/2008 [facility] Patient Information 2015 [place], [place]. This information is not intended to replace advice given to you by your health care provider. Make sure you discuss any questions you have with your health care provider.  Back Exercises Back exercises help treat and prevent back injuries. The goal of back exercises is to increase the strength of your abdominal and back muscles and the flexibility of your back. These exercises should be started when you no longer have back pain. Back exercises include:  Pelvic Tilt. Lie on your back with your knees bent. Tilt your pelvis until the lower part of your back is against the floor. Hold this position 5 to 10 sec and repeat 5 to 10 times.  Knee to Chest. Pull first 1 knee up against your chest and hold for 20 to 30 seconds, repeat this with the other knee, and then both knees. This may be done with the other leg straight or bent, whichever feels better.  Sit-Ups or Curl-Ups. Bend your knees 90 degrees. Start with tilting your pelvis, and do a partial, slow sit-up, lifting your trunk only 30 to 45 degrees off the floor. Take at least 2 to  3 seconds for each sit-up. Do not do sit-ups with your knees out straight. If partial sit-ups are difficult, simply do the above but with only tightening your abdominal muscles and holding it as directed.  Hip-Lift. Lie on your back with your knees flexed 90 degrees. Push down with your feet and shoulders as you raise your hips a couple inches off the floor; hold for 10 seconds, repeat 5 to 10 times.  Back arches. Lie on your stomach,  propping yourself up on bent elbows. Slowly press on your hands, causing an arch in your low back. Repeat 3 to 5 times. Any initial stiffness and discomfort should lessen with repetition over time.  Shoulder-Lifts. Lie face down with arms beside your body. Keep hips and torso pressed to floor as you slowly lift your head and shoulders off the floor. Do not overdo your exercises, especially in the beginning. Exercises may cause you some mild back discomfort which lasts for a few minutes; however, if the pain is more severe, or lasts for more than 15 minutes, do not continue exercises until you see your caregiver. Improvement with exercise therapy for back problems is slow.  Cervical Strain and Sprain (Whiplash) with Rehab Cervical strain and sprain are injuries that commonly occur with "whiplash" injuries. Whiplash occurs when the neck is forcefully whipped backward or forward, such as during a motor vehicle accident or during contact sports. The muscles, ligaments, tendons, discs, and nerves of the neck are susceptible to injury when this occurs. RISK FACTORS Risk of having a whiplash injury increases if:  Osteoarthritis of the spine.  Situations that make head or neck accidents or trauma more likely.  High-risk sports (football, rugby, wrestling, hockey, auto racing, gymnastics, diving, contact karate, or boxing).  Poor strength and flexibility of the neck.  Previous neck injury.  Poor tackling technique.  Improperly fitted or padded equipment. SYMPTOMS   Pain or stiffness in the front or back of neck or both.  Symptoms may present immediately or up to 24 hours after injury.  Dizziness, headache, nausea, and vomiting.  Muscle spasm with soreness and stiffness in the neck.  Tenderness and swelling at the injury site. PREVENTION  Learn and use proper technique (avoid tackling with the head, spearing, and head-butting; use proper falling techniques to avoid landing on the  head).  Warm up and stretch properly before activity.  Maintain physical fitness:  Strength, flexibility, and endurance.  Cardiovascular fitness.  Wear properly fitted and padded protective equipment, such as padded soft collars, for participation in contact sports. PROGNOSIS  Recovery from cervical strain and sprain injuries is dependent on the extent of the injury. These injuries are usually curable in 1 week to 3 months with appropriate treatment.  RELATED COMPLICATIONS   Temporary numbness and weakness may occur if the nerve roots are damaged, and this may persist until the nerve has completely healed.  Chronic pain due to frequent recurrence of symptoms.  Prolonged healing, especially if activity is resumed too soon (before complete recovery). TREATMENT  Treatment initially involves the use of ice and medication to help reduce pain and inflammation. It is also important to perform strengthening and stretching exercises and modify activities that worsen symptoms so the injury does not get worse. These exercises may be performed at home or with a therapist. For patients who experience severe symptoms, a soft, padded collar may be recommended to be worn around the neck.  Improving your posture may help reduce symptoms. Posture improvement includes pulling your  chin and abdomen in while sitting or standing. If you are sitting, sit in a firm chair with your buttocks against the back of the chair. While sleeping, try replacing your pillow with a small towel rolled to 2 inches in diameter, or use a cervical pillow or soft cervical collar. Poor sleeping positions delay healing.  For patients with nerve root damage, which causes numbness or weakness, the use of a cervical traction apparatus may be recommended. Surgery is rarely necessary for these injuries. However, cervical strain and sprains that are present at birth (congenital) may require surgery. MEDICATION   If pain medication is  necessary, nonsteroidal anti-inflammatory medications, such as aspirin and ibuprofen, or other minor pain relievers, such as acetaminophen, are often recommended.  Do not take pain medication for 7 days before surgery.  Prescription pain relievers may be given if deemed necessary by your caregiver. Use only as directed and only as much as you need. HEAT AND COLD:   Cold treatment (icing) relieves pain and reduces inflammation. Cold treatment should be applied for 10 to 15 minutes every 2 to 3 hours for inflammation and pain and immediately after any activity that aggravates your symptoms. Use ice packs or an ice massage.  Heat treatment may be used prior to performing the stretching and strengthening activities prescribed by your caregiver, physical therapist, or athletic trainer. Use a heat pack or a warm soak. SEEK MEDICAL CARE IF:   Symptoms get worse or do not improve in 2 weeks despite treatment.  New, unexplained symptoms develop (drugs used in treatment may produce side effects). EXERCISES RANGE OF MOTION (ROM) AND STRETCHING EXERCISES - Cervical Strain and Sprain These exercises may help you when beginning to rehabilitate your injury. In order to successfully resolve your symptoms, you must improve your posture. These exercises are designed to help reduce the forward-head and rounded-shoulder posture which contributes to this condition. Your symptoms may resolve with or without further involvement from your physician, physical therapist or athletic trainer. While completing these exercises, remember:   Restoring tissue flexibility helps normal motion to return to the joints. This allows healthier, less painful movement and activity.  An effective stretch should be held for at least 20 seconds, although you may need to begin with shorter hold times for comfort.  A stretch should never be painful. You should only feel a gentle lengthening or release in the stretched tissue. STRETCH-  Axial Extensors  Lie on your back on the floor. You may bend your knees for comfort. Place a rolled-up hand towel or dish towel, about 2 inches in diameter, under the part of your head that makes contact with the floor.  Gently tuck your chin, as if trying to make a "double chin," until you feel a gentle stretch at the base of your head.  Hold __________ seconds. Repeat __________ times. Complete this exercise __________ times per day.  STRETCH - Axial Extension   Stand or sit on a firm surface. Assume a good posture: chest up, shoulders drawn back, abdominal muscles slightly tense, knees unlocked (if standing) and feet hip width apart.  Slowly retract your chin so your head slides back and your chin slightly lowers. Continue to look straight ahead.  You should feel a gentle stretch in the back of your head. Be certain not to feel an aggressive stretch since this can cause headaches later.  Hold for __________ seconds. Repeat __________ times. Complete this exercise __________ times per day. STRETCH - Cervical Side Bend  Stand or sit on a firm surface. Assume a good posture: chest up, shoulders drawn back, abdominal muscles slightly tense, knees unlocked (if standing) and feet hip width apart.  Without letting your nose or shoulders move, slowly tip your right / left ear to your shoulder until your feel a gentle stretch in the muscles on the opposite side of your neck.  Hold __________ seconds. Repeat __________ times. Complete this exercise __________ times per day. STRETCH - Cervical Rotators   Stand or sit on a firm surface. Assume a good posture: chest up, shoulders drawn back, abdominal muscles slightly tense, knees unlocked (if standing) and feet hip width apart.  Keeping your eyes level with the ground, slowly turn your head until you feel a gentle stretch along the back and opposite side of your neck.  Hold __________ seconds. Repeat __________ times. Complete this exercise  __________ times per day. RANGE OF MOTION - Neck Circles   Stand or sit on a firm surface. Assume a good posture: chest up, shoulders drawn back, abdominal muscles slightly tense, knees unlocked (if standing) and feet hip width apart.  Gently roll your head down and around from the back of one shoulder to the back of the other. The motion should never be forced or painful.  Repeat the motion 10-20 times, or until you feel the neck muscles relax and loosen. Repeat __________ times. Complete the exercise __________ times per day. STRENGTHENING EXERCISES - Cervical Strain and Sprain These exercises may help you when beginning to rehabilitate your injury. They may resolve your symptoms with or without further involvement from your physician, physical therapist, or athletic trainer. While completing these exercises, remember:   Muscles can gain both the endurance and the strength needed for everyday activities through controlled exercises.  Complete these exercises as instructed by your physician, physical therapist, or athletic trainer. Progress the resistance and repetitions only as guided.  You may experience muscle soreness or fatigue, but the pain or discomfort you are trying to eliminate should never worsen during these exercises. If this pain does worsen, stop and make certain you are following the directions exactly. If the pain is still present after adjustments, discontinue the exercise until you can discuss the trouble with your clinician. STRENGTH - Cervical Flexors, Isometric  Face a wall, standing about 6 inches away. Place a small pillow, a ball about 6-8 inches in diameter, or a folded towel between your forehead and the wall.  Slightly tuck your chin and gently push your forehead into the soft object. Push only with mild to moderate intensity, building up tension gradually. Keep your jaw and forehead relaxed.  Hold 10 to 20 seconds. Keep your breathing relaxed.  Release the  tension slowly. Relax your neck muscles completely before you start the next repetition. Repeat __________ times. Complete this exercise __________ times per day. STRENGTH- Cervical Lateral Flexors, Isometric   Stand about 6 inches away from a wall. Place a small pillow, a ball about 6-8 inches in diameter, or a folded towel between the side of your head and the wall.  Slightly tuck your chin and gently tilt your head into the soft object. Push only with mild to moderate intensity, building up tension gradually. Keep your jaw and forehead relaxed.  Hold 10 to 20 seconds. Keep your breathing relaxed.  Release the tension slowly. Relax your neck muscles completely before you start the next repetition. Repeat __________ times. Complete this exercise __________ times per day. STRENGTH - Cervical Extensors, Isometric  Stand about 6 inches away from a wall. Place a small pillow, a ball about 6-8 inches in diameter, or a folded towel between the back of your head and the wall.  Slightly tuck your chin and gently tilt your head back into the soft object. Push only with mild to moderate intensity, building up tension gradually. Keep your jaw and forehead relaxed.  Hold 10 to 20 seconds. Keep your breathing relaxed.  Release the tension slowly. Relax your neck muscles completely before you start the next repetition. Repeat __________ times. Complete this exercise __________ times per day. POSTURE AND BODY MECHANICS CONSIDERATIONS - Cervical Strain and Sprain Keeping correct posture when sitting, standing or completing your activities will reduce the stress put on different body tissues, allowing injured tissues a chance to heal and limiting painful experiences. The following are general guidelines for improved posture. Your physician or physical therapist will provide you with any instructions specific to your needs. While reading these guidelines, remember:  The exercises prescribed by your provider  will help you have the flexibility and strength to maintain correct postures.  The correct posture provides the optimal environment for your joints to work. All of your joints have less wear and tear when properly supported by a spine with good posture. This means you will experience a healthier, less painful body.  Correct posture must be practiced with all of your activities, especially prolonged sitting and standing. Correct posture is as important when doing repetitive low-stress activities (typing) as it is when doing a single heavy-load activity (lifting). PROLONGED STANDING WHILE SLIGHTLY LEANING FORWARD When completing a task that requires you to lean forward while standing in one place for a long time, place either foot up on a stationary 2- to 4-inch high object to help maintain the best posture. When both feet are on the ground, the low back tends to lose its slight inward curve. If this curve flattens (or becomes too large), then the back and your other joints will experience too much stress, fatigue more quickly, and can cause pain.  RESTING POSITIONS Consider which positions are most painful for you when choosing a resting position. If you have pain with flexion-based activities (sitting, bending, stooping, squatting), choose a position that allows you to rest in a less flexed posture. You would want to avoid curling into a fetal position on your side. If your pain worsens with extension-based activities (prolonged standing, working overhead), avoid resting in an extended position such as sleeping on your stomach. Most people will find more comfort when they rest with their spine in a more neutral position, neither too rounded nor too arched. Lying on a non-sagging bed on your side with a pillow between your knees, or on your back with a pillow under your knees will often provide some relief. Keep in mind, being in any one position for a prolonged period of time, no matter how correct your  posture, can still lead to stiffness. WALKING Walk with an upright posture. Your ears, shoulders, and hips should all line up. OFFICE WORK When working at a desk, create an environment that supports good, upright posture. Without extra support, muscles fatigue and lead to excessive strain on joints and other tissues. CHAIR:  A chair should be able to slide under your desk when your back makes contact with the back of the chair. This allows you to work closely.  The chair's height should allow your eyes to be level with the upper part of your monitor and  your hands to be slightly lower than your elbows.  Body position:  Your feet should make contact with the floor. If this is not possible, use a foot rest.  Keep your ears over your shoulders. This will reduce stress on your neck and low back. Document Released: 12/15/2005 Document Revised: 05/01/2014 Document Reviewed: 03/29/2009 [facility] Patient Information 2015 [place], [place]. This information is not intended to replace advice given to you by your health care provider. Make sure you discuss any questions you have with your health care provider.  See your caregivers for assistance with developing a proper back exercise program. Document Released: 01/22/2005 Document Revised: 03/08/2012 Document Reviewed: 10/16/2011 [facility] Patient Information 2015 [place], [place]. This information is not intended to replace advice given to you by your health care provider. Make sure you discuss any questions you have with your health care provider.

## 2014-12-15 ENCOUNTER — Ambulatory Visit: Payer: Medicare Other | Admitting: Internal Medicine

## 2014-12-18 ENCOUNTER — Other Ambulatory Visit (INDEPENDENT_AMBULATORY_CARE_PROVIDER_SITE_OTHER): Payer: Medicare Other

## 2014-12-18 ENCOUNTER — Ambulatory Visit (INDEPENDENT_AMBULATORY_CARE_PROVIDER_SITE_OTHER): Payer: Medicare Other | Admitting: Internal Medicine

## 2014-12-18 ENCOUNTER — Encounter: Payer: Self-pay | Admitting: Internal Medicine

## 2014-12-18 DIAGNOSIS — R413 Other amnesia: Secondary | ICD-10-CM | POA: Diagnosis not present

## 2014-12-18 DIAGNOSIS — M5416 Radiculopathy, lumbar region: Secondary | ICD-10-CM

## 2014-12-18 DIAGNOSIS — J209 Acute bronchitis, unspecified: Secondary | ICD-10-CM | POA: Diagnosis not present

## 2014-12-18 DIAGNOSIS — R351 Nocturia: Secondary | ICD-10-CM

## 2014-12-18 DIAGNOSIS — J069 Acute upper respiratory infection, unspecified: Secondary | ICD-10-CM | POA: Diagnosis not present

## 2014-12-18 LAB — CBC WITH DIFFERENTIAL/PLATELET
Basophils Absolute: 0 10*3/uL (ref 0.0–0.1)
Basophils Relative: 0.2 % (ref 0.0–3.0)
Eosinophils Absolute: 0.5 10*3/uL (ref 0.0–0.7)
Eosinophils Relative: 4.3 % (ref 0.0–5.0)
HCT: 37.1 % (ref 36.0–46.0)
Hemoglobin: 12.1 g/dL (ref 12.0–15.0)
Lymphocytes Relative: 8.7 % — ABNORMAL LOW (ref 12.0–46.0)
Lymphs Abs: 0.9 10*3/uL (ref 0.7–4.0)
MCHC: 32.7 g/dL (ref 30.0–36.0)
MCV: 98.6 fl (ref 78.0–100.0)
Monocytes Absolute: 0.9 10*3/uL (ref 0.1–1.0)
Monocytes Relative: 8.6 % (ref 3.0–12.0)
Neutro Abs: 8.5 10*3/uL — ABNORMAL HIGH (ref 1.4–7.7)
Neutrophils Relative %: 78.2 % — ABNORMAL HIGH (ref 43.0–77.0)
Platelets: 157 10*3/uL (ref 150.0–400.0)
RBC: 3.77 Mil/uL — ABNORMAL LOW (ref 3.87–5.11)
RDW: 13.8 % (ref 11.5–15.5)
WBC: 10.8 10*3/uL — ABNORMAL HIGH (ref 4.0–10.5)

## 2014-12-18 LAB — RPR

## 2014-12-18 MED ORDER — AMOXICILLIN 500 MG PO CAPS: 500.0000 mg | ORAL_CAPSULE | Freq: Three times a day (TID) | ORAL | Status: DC

## 2014-12-18 MED ORDER — GABAPENTIN 100 MG PO CAPS: ORAL_CAPSULE | ORAL | Status: DC
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
## 2014-12-18 NOTE — Patient Instructions (Addendum)
The  referrals will be scheduled and you'll be notified of the time.Please call the Referral Co-Ordinator @ [phone] if you have not been notified of appointment time within 7-10 days.  Zicam Melts or Zinc lozenges as per package label for sore throat .

## 2014-12-18 NOTE — Progress Notes (Signed)
Pre visit review using our clinic review tool, if applicable. No additional management support is needed unless otherwise documented below in the visit note. 

## 2014-12-19 ENCOUNTER — Encounter: Payer: Self-pay | Admitting: Internal Medicine

## 2014-12-19 ENCOUNTER — Other Ambulatory Visit: Payer: Self-pay | Admitting: Internal Medicine

## 2014-12-19 LAB — VITAMIN B12: Vitamin B-12: 244 pg/mL (ref 211–911)

## 2014-12-19 MED ORDER — ALBUTEROL SULFATE HFA 108 (90 BASE) MCG/ACT IN AERS: 2.0000 | INHALATION_SPRAY | Freq: Four times a day (QID) | RESPIRATORY_TRACT | Status: DC | PRN

## 2014-12-20 ENCOUNTER — Other Ambulatory Visit: Payer: Self-pay | Admitting: Internal Medicine

## 2014-12-20 DIAGNOSIS — E538 Deficiency of other specified B group vitamins: Secondary | ICD-10-CM

## 2014-12-25 ENCOUNTER — Other Ambulatory Visit: Payer: Self-pay | Admitting: Internal Medicine

## 2014-12-25 ENCOUNTER — Encounter: Payer: Self-pay | Admitting: Internal Medicine

## 2014-12-25 MED ORDER — AZITHROMYCIN 250 MG PO TABS: ORAL_TABLET | ORAL | Status: DC

## 2014-12-26 ENCOUNTER — Other Ambulatory Visit: Payer: Self-pay | Admitting: Internal Medicine

## 2014-12-26 ENCOUNTER — Ambulatory Visit (INDEPENDENT_AMBULATORY_CARE_PROVIDER_SITE_OTHER): Payer: Medicare Other | Admitting: Neurology

## 2014-12-26 ENCOUNTER — Encounter: Payer: Self-pay | Admitting: Internal Medicine

## 2014-12-26 ENCOUNTER — Encounter: Payer: Self-pay | Admitting: Neurology

## 2014-12-26 VITALS — BP 132/92 | HR 82 | Resp 18 | Ht 60.0 in | Wt 159.0 lb

## 2014-12-26 DIAGNOSIS — F0781 Postconcussional syndrome: Secondary | ICD-10-CM

## 2014-12-26 DIAGNOSIS — R413 Other amnesia: Secondary | ICD-10-CM | POA: Diagnosis not present

## 2014-12-26 MED ORDER — BENZONATATE 200 MG PO CAPS: 200.0000 mg | ORAL_CAPSULE | Freq: Three times a day (TID) | ORAL | Status: DC | PRN
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
## 2014-12-26 NOTE — Patient Instructions (Signed)
1. MRI brain without contrast 2. Physical and cognitive (brain) rest are important after a concussion 3. Follow-up in 6 months

## 2014-12-28 ENCOUNTER — Encounter: Payer: Self-pay | Admitting: Neurology

## 2015-01-03 ENCOUNTER — Encounter: Payer: Self-pay | Admitting: Internal Medicine

## 2015-01-03 ENCOUNTER — Other Ambulatory Visit: Payer: Self-pay | Admitting: Internal Medicine

## 2015-01-03 DIAGNOSIS — J441 Chronic obstructive pulmonary disease with (acute) exacerbation: Secondary | ICD-10-CM

## 2015-01-03 NOTE — Telephone Encounter (Signed)
Patient would like to know if she should see her pulmonologist, Dr. Halford Chessman, for her productive cough. Please see her MyChart message.

## 2015-01-05 ENCOUNTER — Ambulatory Visit (INDEPENDENT_AMBULATORY_CARE_PROVIDER_SITE_OTHER)
Admission: RE | Admit: 2015-01-05 | Discharge: 2015-01-05 | Disposition: A | Discharge status: Home / Self Care | Payer: Medicare Other | Source: Ambulatory Visit | Attending: Adult Health | Admitting: Adult Health

## 2015-01-05 ENCOUNTER — Ambulatory Visit (INDEPENDENT_AMBULATORY_CARE_PROVIDER_SITE_OTHER): Payer: Medicare Other | Admitting: Adult Health

## 2015-01-05 ENCOUNTER — Encounter: Payer: Self-pay | Admitting: Adult Health

## 2015-01-05 ENCOUNTER — Other Ambulatory Visit (INDEPENDENT_AMBULATORY_CARE_PROVIDER_SITE_OTHER): Payer: Medicare Other

## 2015-01-05 VITALS — BP 118/88 | HR 74 | Temp 97.1°F | Ht 60.0 in | Wt 159.0 lb

## 2015-01-05 DIAGNOSIS — J8289 Other pulmonary eosinophilia, not elsewhere classified: Secondary | ICD-10-CM

## 2015-01-05 DIAGNOSIS — J45998 Other asthma: Secondary | ICD-10-CM

## 2015-01-05 DIAGNOSIS — J82 Pulmonary eosinophilia, not elsewhere classified: Secondary | ICD-10-CM

## 2015-01-05 DIAGNOSIS — E538 Deficiency of other specified B group vitamins: Secondary | ICD-10-CM

## 2015-01-05 DIAGNOSIS — R05 Cough: Secondary | ICD-10-CM | POA: Diagnosis not present

## 2015-01-05 LAB — CBC WITH DIFFERENTIAL/PLATELET
Basophils Absolute: 0.1 10*3/uL (ref 0.0–0.1)
Basophils Relative: 1 % (ref 0.0–3.0)
Eosinophils Absolute: 1.1 10*3/uL — ABNORMAL HIGH (ref 0.0–0.7)
Eosinophils Relative: 13.5 % — ABNORMAL HIGH (ref 0.0–5.0)
HCT: 36.6 % (ref 36.0–46.0)
Hemoglobin: 12.2 g/dL (ref 12.0–15.0)
Lymphocytes Relative: 17.9 % (ref 12.0–46.0)
Lymphs Abs: 1.4 10*3/uL (ref 0.7–4.0)
MCHC: 33.4 g/dL (ref 30.0–36.0)
MCV: 96.1 fl (ref 78.0–100.0)
Monocytes Absolute: 0.7 10*3/uL (ref 0.1–1.0)
Monocytes Relative: 8.5 % (ref 3.0–12.0)
Neutro Abs: 4.7 10*3/uL (ref 1.4–7.7)
Neutrophils Relative %: 59.1 % (ref 43.0–77.0)
Platelets: 259 10*3/uL (ref 150.0–400.0)
RBC: 3.81 Mil/uL — ABNORMAL LOW (ref 3.87–5.11)
RDW: 13.3 % (ref 11.5–15.5)
WBC: 7.9 10*3/uL (ref 4.0–10.5)

## 2015-01-05 MED ORDER — MOMETASONE FURO-FORMOTEROL FUM 100-5 MCG/ACT IN AERO: 2.0000 | INHALATION_SPRAY | Freq: Two times a day (BID) | RESPIRATORY_TRACT | Status: AC

## 2015-01-05 MED ORDER — PREDNISONE 20 MG PO TABS: ORAL_TABLET | ORAL | Status: DC

## 2015-01-05 NOTE — Patient Instructions (Signed)
Increase Prednisone 40mg  daily for 2 weeks , then 30mg  daily for 1 week then 20mg  daily for 1 week then hold 10mg  daily until seen back in office  Follow up Dr. Halford Chessman  In 2-3  week with chest xray and As needed   Labs done today, will call with results.  Please contact office for sooner follow up if symptoms do not improve or worsen or seek emergency care
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
## 2015-01-05 NOTE — Addendum Note (Signed)
Addended by: Parke Poisson E on: 01/05/2015 04:49 PM   Modules accepted: Orders

## 2015-01-05 NOTE — Assessment & Plan Note (Signed)
?  flare with reoccurence of Right sided infiltrates No clinical improvement with abx x 2  On minimal dose of chronic steroids at 2.5mg  .every other day.   Check cbc with diff  Plan  Increase Prednisone 40mg  daily for 2 weeks , then 30mg  daily for 1 week then 20mg  daily for 1 week then hold 10mg  daily until seen back in office  Follow up Dr. Halford Chessman  In 2-3  week with chest xray and As needed   Labs done today, will call with results.  Please contact office for sooner follow up if symptoms do not improve or worsen or seek emergency care

## 2015-01-07 NOTE — Progress Notes (Signed)
Reviewed and agree.  Looks like she has flare of eosinophilic PNA with recurrent pulmonary infiltrates and elevated peripheral eosinophilia.

## 2015-01-08 ENCOUNTER — Telehealth: Payer: Self-pay | Admitting: Neurology

## 2015-01-08 ENCOUNTER — Ambulatory Visit
Admission: RE | Admit: 2015-01-08 | Discharge: 2015-01-08 | Disposition: A | Discharge status: Home / Self Care | Payer: Medicare Other | Source: Ambulatory Visit | Attending: Neurology | Admitting: Neurology

## 2015-01-08 DIAGNOSIS — S0990XA Unspecified injury of head, initial encounter: Secondary | ICD-10-CM | POA: Diagnosis not present

## 2015-01-08 DIAGNOSIS — R413 Other amnesia: Secondary | ICD-10-CM | POA: Diagnosis not present

## 2015-01-08 DIAGNOSIS — R4182 Altered mental status, unspecified: Secondary | ICD-10-CM | POA: Diagnosis not present

## 2015-01-08 LAB — METHYLMALONIC ACID, SERUM: Methylmalonic Acid, Quant: 181 nmol/L (ref 87–318)

## 2015-01-08 NOTE — Telephone Encounter (Signed)
Discussed MRI results with patient, MRI brain unremarkable, no acute changes. 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 Recommend CT of the cervical spine further evaluation. She is already scheduled to see Ortho Dr. Marcene Duos at 1/13 at 10:45am for leg symptoms. Instructed patient to discuss symptoms and MRI findings with him, our office will send him report of MRI brain. Discussed doing CT cervical spine, and if symptoms worsen acutely, go to ER immediately.   Tiff, pls fax report to Dr. Marlou Sa and FYI appt on 1/13 with patient. Thanks

## 2015-01-09 NOTE — Telephone Encounter (Signed)
Results faxed to Dr. Marlou Sa.

## 2015-01-10 DIAGNOSIS — M5442 Lumbago with sciatica, left side: Secondary | ICD-10-CM | POA: Diagnosis not present

## 2015-01-10 DIAGNOSIS — S12100K Unspecified displaced fracture of second cervical vertebra, subsequent encounter for fracture with nonunion: Secondary | ICD-10-CM | POA: Diagnosis not present

## 2015-01-10 DIAGNOSIS — M25512 Pain in left shoulder: Secondary | ICD-10-CM | POA: Diagnosis not present

## 2015-01-10 DIAGNOSIS — M25511 Pain in right shoulder: Secondary | ICD-10-CM | POA: Diagnosis not present

## 2015-01-11 ENCOUNTER — Other Ambulatory Visit: Payer: Self-pay | Admitting: Orthopedic Surgery

## 2015-01-11 DIAGNOSIS — M545 Low back pain: Secondary | ICD-10-CM

## 2015-01-11 DIAGNOSIS — M542 Cervicalgia: Secondary | ICD-10-CM

## 2015-01-12 ENCOUNTER — Other Ambulatory Visit: Payer: Medicare Other

## 2015-01-15 ENCOUNTER — Other Ambulatory Visit: Payer: Self-pay | Admitting: Internal Medicine

## 2015-01-15 ENCOUNTER — Encounter: Payer: Self-pay | Admitting: Internal Medicine

## 2015-01-15 MED ORDER — ONDANSETRON HCL 4 MG PO TABS: 4.0000 mg | ORAL_TABLET | Freq: Three times a day (TID) | ORAL | Status: DC | PRN

## 2015-01-22 ENCOUNTER — Ambulatory Visit: Payer: Medicare Other | Admitting: Pulmonary Disease

## 2015-01-22 ENCOUNTER — Encounter: Payer: Self-pay | Admitting: Internal Medicine

## 2015-01-22 ENCOUNTER — Telehealth: Payer: Self-pay | Admitting: Pulmonary Disease

## 2015-01-22 ENCOUNTER — Encounter: Payer: Self-pay | Admitting: Neurology

## 2015-01-22 NOTE — Telephone Encounter (Signed)
Please call to reschedule her ROV >> can be with me or Tammy Parrett.

## 2015-01-23 ENCOUNTER — Encounter: Payer: Self-pay | Admitting: Neurology

## 2015-01-23 ENCOUNTER — Other Ambulatory Visit: Payer: Medicare Other

## 2015-01-23 ENCOUNTER — Telehealth: Payer: Self-pay | Admitting: Family Medicine
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
## 2015-01-23 NOTE — Telephone Encounter (Signed)
Pt scheduled for OV with VS 01/31/15 at 10am with cxr prior. Nothing further needed.

## 2015-01-24 ENCOUNTER — Ambulatory Visit: Payer: Self-pay | Admitting: Internal Medicine

## 2015-01-25 ENCOUNTER — Ambulatory Visit: Payer: Medicare Other | Admitting: Internal Medicine

## 2015-01-25 ENCOUNTER — Ambulatory Visit
Admission: RE | Admit: 2015-01-25 | Discharge: 2015-01-25 | Disposition: A | Discharge status: Home / Self Care | Payer: Medicare Other | Source: Ambulatory Visit | Attending: Orthopedic Surgery | Admitting: Orthopedic Surgery

## 2015-01-25 DIAGNOSIS — M545 Low back pain: Secondary | ICD-10-CM

## 2015-01-25 DIAGNOSIS — M4807 Spinal stenosis, lumbosacral region: Secondary | ICD-10-CM | POA: Diagnosis not present

## 2015-01-25 DIAGNOSIS — S12101K Unspecified nondisplaced fracture of second cervical vertebra, subsequent encounter for fracture with nonunion: Secondary | ICD-10-CM | POA: Diagnosis not present

## 2015-01-25 DIAGNOSIS — M542 Cervicalgia: Secondary | ICD-10-CM

## 2015-01-25 DIAGNOSIS — S3992XA Unspecified injury of lower back, initial encounter: Secondary | ICD-10-CM | POA: Diagnosis not present

## 2015-01-25 DIAGNOSIS — M5136 Other intervertebral disc degeneration, lumbar region: Secondary | ICD-10-CM | POA: Diagnosis not present

## 2015-01-26 ENCOUNTER — Encounter: Payer: Self-pay | Admitting: Family

## 2015-01-26 ENCOUNTER — Ambulatory Visit (INDEPENDENT_AMBULATORY_CARE_PROVIDER_SITE_OTHER): Payer: Medicare Other | Admitting: Family

## 2015-01-26 VITALS — BP 148/90 | HR 72 | Temp 98.0°F | Resp 18 | Wt 161.0 lb

## 2015-01-26 DIAGNOSIS — J019 Acute sinusitis, unspecified: Secondary | ICD-10-CM | POA: Diagnosis not present

## 2015-01-26 MED ORDER — BENZONATATE 200 MG PO CAPS: 200.0000 mg | ORAL_CAPSULE | Freq: Three times a day (TID) | ORAL | Status: DC | PRN

## 2015-01-26 MED ORDER — LEVOFLOXACIN 500 MG PO TABS: 500.0000 mg | ORAL_TABLET | Freq: Every day | ORAL | Status: DC

## 2015-01-26 MED ORDER — FLUCONAZOLE 150 MG PO TABS: 150.0000 mg | ORAL_TABLET | Freq: Once | ORAL | Status: DC

## 2015-01-26 NOTE — Assessment & Plan Note (Signed)
Symptoms and exam consistent with bacterial sinusitis. Given previous antibiotic use will start levofloxacin. Refill Tessalon. Start Diflucan if use infection develops from multiple antibiotic use. Continue over-the-counter medications as needed for symptom relief. Follow-up if symptoms worsen or fail to improve.

## 2015-01-26 NOTE — Progress Notes (Signed)
Subjective:    Patient ID: Lisa Larson, female    DOB: 11-Feb-1941, 74 y.o.   MRN: [id number]  Chief Complaint  Patient presents with  . Cough    productive cough, sore throat, would like refill of tessalon, sinus drainage, has been sick since december was well for 2 weeks and started getting sick again    HPI:  Lisa Larson is a 74 y.o. female who presents today for an acute visit.   Associated symptoms of productive cough producing purulent sputum and sore throat  has been going on since December with a two week break from symptoms. She has been tried on Azithromycin, Amoxicillin and prednisone which provided minimal relief.   Allergies  Allergen Reactions  . Sulfonamide Derivatives     Rash; dyspnea  . Ace Inhibitors   . Benazepril Hcl     No PMH of angioedema; ACE-I caused cough  . Diclofenac   . Oxycodone-Aspirin   . Rofecoxib     Current Outpatient Prescriptions on File Prior to Visit  Medication Sig Dispense Refill  . albuterol (PROAIR HFA) 108 (90 BASE) MCG/ACT inhaler Inhale 2 puffs into the lungs every 6 (six) hours as needed. 18 g 2  . benzonatate (TESSALON) 200 MG capsule Take 1 capsule (200 mg total) by mouth 3 (three) times daily as needed for cough. 15 capsule 0  . calcium carbonate (OS-CAL) 600 MG TABS tablet Take 600 mg by mouth 2 (two) times daily with a meal.    . Cetirizine HCl 10 MG CAPS Take 10 mg by mouth daily.    . Cholecalciferol (VITAMIN D3) 2000 UNITS capsule Take 2,000 Units by mouth daily.    Marland Kitchen gabapentin (NEURONTIN) 100 MG capsule One pill every eight hours as needed for leg pain 30 capsule 2  . ibuprofen (ADVIL,MOTRIN) 400 MG tablet Take 1 tablet (400 mg total) by mouth every 6 (six) hours as needed. 30 tablet 0  . LORazepam (ATIVAN) 1 MG tablet TAKE 1/2 TO 1 TABLET BY MOUTH EVERY 8 HOURS AS NEEDED ONLY. (DO NOT TAKE WITHIN 8-12 HOURS OF ALCOHOL.) (Patient taking differently: Take 0.5-1 mg by mouth as needed for anxiety. ) 30 tablet 1    . losartan (COZAAR) 100 MG tablet TAKE 1 TABLET BY MOUTH EVERY DAY (Patient taking differently: Take 100 mg by mouth daily. ) 90 tablet 3  . mometasone-formoterol (DULERA) 100-5 MCG/ACT AERO Inhale 2 puffs into the lungs 2 (two) times daily. 2 Inhaler 0  . omeprazole (PRILOSEC) 20 MG capsule Take 1 capsule (20 mg total) by mouth daily. 30 capsule 5  . ondansetron (ZOFRAN) 4 MG tablet Take 1 tablet (4 mg total) by mouth every 8 (eight) hours as needed. for nausea 12 tablet 0  . predniSONE (DELTASONE) 20 MG tablet 2 tabs daily for 2 weeks then 1.5  tab daily for 1 week then 1 tab daily for 1 week  then 1/2 tab daily and hold at this dose. 60 tablet 0  . predniSONE (DELTASONE) 5 MG tablet Take 1/2 tablet every other day. (Patient taking differently: Take 2.5 mg by mouth every other day. ) 30 tablet 3  . Probiotic Product (PHILLIPS COLON HEALTH PO) Take 1 capsule by mouth daily.    . sertraline (ZOLOFT) 50 MG tablet Take 1 tablet (50 mg total) by mouth daily. 30 tablet 5  . thiamine (VITAMIN B-1) 100 MG tablet Take 50 mg by mouth daily.      No current facility-administered medications on file prior  to visit.      Review of Systems  Constitutional: Negative for fever and chills.  HENT: Positive for congestion, sinus pressure and sore throat.   Respiratory: Negative for chest tightness and shortness of breath.   Cardiovascular: Negative for chest pain.  Neurological: Negative for headaches.      Objective:    BP 148/90 mmHg  Pulse 72  Temp(Src) 98 F (36.7 C) (Oral)  Resp 18  Wt 161 lb (73.029 kg)  SpO2 97% Nursing note and vital signs reviewed.  Physical Exam  Constitutional: She is oriented to person, place, and time. She appears well-developed and well-nourished. No distress.  HENT:  Right Ear: Hearing, tympanic membrane, external ear and ear canal normal.  Left Ear: Hearing, tympanic membrane, external ear and ear canal normal.  Nose: Right sinus exhibits maxillary sinus  tenderness and frontal sinus tenderness. Left sinus exhibits maxillary sinus tenderness and frontal sinus tenderness.  Mouth/Throat: Uvula is midline, oropharynx is clear and moist and mucous membranes are normal.  Cardiovascular: Normal rate, regular rhythm, normal heart sounds and intact distal pulses.   Pulmonary/Chest: Effort normal and breath sounds normal.  Neurological: She is alert and oriented to person, place, and time.  Skin: Skin is warm and dry.  Psychiatric: She has a normal mood and affect. Her behavior is normal. Judgment and thought content normal.       Assessment & Plan:

## 2015-01-26 NOTE — Patient Instructions (Signed)
Thank you for choosing [facility].  Summary/Instructions:  Your prescription(s) have been submitted to your pharmacy or been printed and provided for you. Please take as directed and contact our office if you believe you are having problem(s) with the medication(s) or have any questions.  If your symptoms worsen or fail to improve, please contact our office for further instruction, or in case of emergency go directly to the emergency room at the closest medical facility.     

## 2015-01-26 NOTE — Progress Notes (Signed)
Pre visit review using our clinic review tool, if applicable. No additional management support is needed unless otherwise documented below in the visit note. 

## 2015-01-29 ENCOUNTER — Other Ambulatory Visit: Payer: Self-pay | Admitting: Internal Medicine

## 2015-01-29 ENCOUNTER — Telehealth: Payer: Self-pay | Admitting: *Deleted

## 2015-01-29 DIAGNOSIS — S12110A Anterior displaced Type II dens fracture, initial encounter for closed fracture: Secondary | ICD-10-CM | POA: Diagnosis not present

## 2015-01-29 NOTE — Telephone Encounter (Signed)
Should not be a maintenance medication; OV needed for refill

## 2015-01-29 NOTE — Telephone Encounter (Signed)
[facility] - Client TELEPHONE [facility] Patient Name: Lisa Larson Gender: Female DOB: 04/30/1941 Age: 73 Y 9 M 4 D Return Phone Number: [phone] (Primary) Address: [address] City/State/Zip: [name] [address] Client [facility] - Client Client Site [place] - Night Physician [place], [place] Type Call [place] Name [place] Phone Number [phone] Relationship To Patient Self Is this call to report lab results? No Call Type General Information Initial Comment Caller states, has an appt this morning at 10 am, she needs to cancel this due to transportation issues -- would like to reschedule for tomorrow after lunch with anyone General Information Type Appointment Nurse Assessment Guidelines Guideline Title Affirmed Question Affirmed Notes Nurse Date/Time (Eastern Time) Disp. Time Eilene Ghazi Time) Disposition Final User 01/25/2015 7:56:27 AM General Information Provided Yes Eather Colas After Care Instructions Given Call Event Type User Date / Time Description

## 2015-01-30 ENCOUNTER — Encounter: Payer: Self-pay | Admitting: Neurology

## 2015-01-31 ENCOUNTER — Ambulatory Visit (INDEPENDENT_AMBULATORY_CARE_PROVIDER_SITE_OTHER)
Admission: RE | Admit: 2015-01-31 | Discharge: 2015-01-31 | Disposition: A | Discharge status: Home / Self Care | Payer: Medicare Other | Source: Ambulatory Visit | Attending: Pulmonary Disease | Admitting: Pulmonary Disease

## 2015-01-31 ENCOUNTER — Ambulatory Visit (INDEPENDENT_AMBULATORY_CARE_PROVIDER_SITE_OTHER): Payer: Medicare Other | Admitting: Pulmonary Disease

## 2015-01-31 ENCOUNTER — Encounter: Payer: Self-pay | Admitting: Pulmonary Disease

## 2015-01-31 VITALS — BP 128/72 | HR 90 | Temp 98.1°F | Ht 60.0 in | Wt 164.0 lb

## 2015-01-31 DIAGNOSIS — J82 Pulmonary eosinophilia, not elsewhere classified: Secondary | ICD-10-CM | POA: Diagnosis not present

## 2015-01-31 DIAGNOSIS — J8289 Other pulmonary eosinophilia, not elsewhere classified: Secondary | ICD-10-CM

## 2015-01-31 DIAGNOSIS — J189 Pneumonia, unspecified organism: Secondary | ICD-10-CM | POA: Diagnosis not present

## 2015-01-31 DIAGNOSIS — J302 Other seasonal allergic rhinitis: Secondary | ICD-10-CM | POA: Diagnosis not present

## 2015-01-31 DIAGNOSIS — J019 Acute sinusitis, unspecified: Secondary | ICD-10-CM

## 2015-01-31 DIAGNOSIS — J45998 Other asthma: Secondary | ICD-10-CM

## 2015-01-31 MED ORDER — FLUTICASONE PROPIONATE 50 MCG/ACT NA SUSP: 2.0000 | Freq: Every day | NASAL | Status: DC

## 2015-01-31 MED ORDER — MOMETASONE FURO-FORMOTEROL FUM 100-5 MCG/ACT IN AERO: 2.0000 | INHALATION_SPRAY | Freq: Two times a day (BID) | RESPIRATORY_TRACT | Status: DC

## 2015-01-31 MED ORDER — PREDNISONE 10 MG PO TABS: 10.0000 mg | ORAL_TABLET | Freq: Every day | ORAL | Status: DC

## 2015-01-31 MED ORDER — LEVOFLOXACIN 500 MG PO TABS: 500.0000 mg | ORAL_TABLET | Freq: Every day | ORAL | Status: DC

## 2015-01-31 NOTE — Patient Instructions (Signed)
Levaquin 500 mg daily for 7 days Prednisone 10 mg daily Dulera two puffs twice per day Flonase two sprays each nostril daily >> check with pharmacist if nasacort is less expensive Follow up in 8 weeks with chest xray and CBC with differential
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
## 2015-02-02 DIAGNOSIS — S12112A Nondisplaced Type II dens fracture, initial encounter for closed fracture: Secondary | ICD-10-CM | POA: Diagnosis not present

## 2015-02-02 DIAGNOSIS — Z6831 Body mass index (BMI) 31.0-31.9, adult: Secondary | ICD-10-CM | POA: Diagnosis not present

## 2015-02-02 DIAGNOSIS — S12100A Unspecified displaced fracture of second cervical vertebra, initial encounter for closed fracture: Secondary | ICD-10-CM | POA: Insufficient documentation

## 2015-02-02 DIAGNOSIS — I10 Essential (primary) hypertension: Secondary | ICD-10-CM | POA: Diagnosis not present

## 2015-02-03 ENCOUNTER — Encounter: Payer: Self-pay | Admitting: Internal Medicine

## 2015-02-05 ENCOUNTER — Ambulatory Visit (INDEPENDENT_AMBULATORY_CARE_PROVIDER_SITE_OTHER): Payer: Medicare Other | Admitting: Internal Medicine

## 2015-02-05 ENCOUNTER — Other Ambulatory Visit (INDEPENDENT_AMBULATORY_CARE_PROVIDER_SITE_OTHER): Payer: Medicare Other

## 2015-02-05 ENCOUNTER — Encounter: Payer: Self-pay | Admitting: Internal Medicine

## 2015-02-05 VITALS — BP 140/82 | HR 80 | Temp 97.7°F | Resp 16 | Ht 60.0 in | Wt 160.0 lb

## 2015-02-05 DIAGNOSIS — R946 Abnormal results of thyroid function studies: Secondary | ICD-10-CM

## 2015-02-05 DIAGNOSIS — M549 Dorsalgia, unspecified: Secondary | ICD-10-CM | POA: Diagnosis not present

## 2015-02-05 DIAGNOSIS — R740 Nonspecific elevation of levels of transaminase and lactic acid dehydrogenase [LDH]: Secondary | ICD-10-CM

## 2015-02-05 DIAGNOSIS — M609 Myositis, unspecified: Secondary | ICD-10-CM | POA: Diagnosis not present

## 2015-02-05 DIAGNOSIS — M791 Myalgia: Secondary | ICD-10-CM

## 2015-02-05 DIAGNOSIS — F411 Generalized anxiety disorder: Secondary | ICD-10-CM | POA: Diagnosis not present

## 2015-02-05 DIAGNOSIS — IMO0001 Reserved for inherently not codable concepts without codable children: Secondary | ICD-10-CM

## 2015-02-05 DIAGNOSIS — R7401 Elevation of levels of liver transaminase levels: Secondary | ICD-10-CM

## 2015-02-05 DIAGNOSIS — F0781 Postconcussional syndrome: Secondary | ICD-10-CM

## 2015-02-05 DIAGNOSIS — M2550 Pain in unspecified joint: Secondary | ICD-10-CM

## 2015-02-05 DIAGNOSIS — R739 Hyperglycemia, unspecified: Secondary | ICD-10-CM

## 2015-02-05 LAB — HEPATIC FUNCTION PANEL
ALT: 25 U/L (ref 0–35)
AST: 24 U/L (ref 0–37)
Albumin: 4.1 g/dL (ref 3.5–5.2)
Alkaline Phosphatase: 45 U/L (ref 39–117)
Bilirubin, Direct: 0.2 mg/dL (ref 0.0–0.3)
Total Bilirubin: 0.7 mg/dL (ref 0.2–1.2)
Total Protein: 7 g/dL (ref 6.0–8.3)

## 2015-02-05 LAB — CK: Total CK: 48 U/L (ref 7–177)

## 2015-02-05 LAB — HEMOGLOBIN A1C: Hgb A1c MFr Bld: 5.6 % (ref 4.6–6.5)

## 2015-02-05 LAB — SEDIMENTATION RATE: Sed Rate: 28 mm/hr — ABNORMAL HIGH (ref 0–22)

## 2015-02-05 LAB — TSH: TSH: 2.08 u[IU]/mL (ref 0.35–4.50)

## 2015-02-05 MED ORDER — DULOXETINE HCL 30 MG PO CPEP: 30.0000 mg | ORAL_CAPSULE | Freq: Every day | ORAL | Status: DC
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
## 2015-02-05 NOTE — Progress Notes (Signed)
Pre visit review using our clinic review tool, if applicable. No additional management support is needed unless otherwise documented below in the visit note. 

## 2015-02-05 NOTE — Patient Instructions (Signed)
Your next office appointment will be determined based upon review of your pending labs . Those instructions will be transmitted to you through My Chart   Critical values will be called  Followup as needed for your acute issue. Please report any significant change in your symptoms.

## 2015-02-05 NOTE — Assessment & Plan Note (Signed)
A1c

## 2015-02-06 ENCOUNTER — Encounter: Payer: Self-pay | Admitting: Internal Medicine

## 2015-02-06 DIAGNOSIS — F0781 Postconcussional syndrome: Secondary | ICD-10-CM | POA: Insufficient documentation

## 2015-02-06 DIAGNOSIS — M549 Dorsalgia, unspecified: Secondary | ICD-10-CM | POA: Insufficient documentation

## 2015-02-06 NOTE — Assessment & Plan Note (Signed)
Cymbalta may help chronic pain as well as anxiety Odontoid fracture monitor by Dr Ellene Route; multiple co-morbidities (chiefly steroid dependent eosinophilic pneumonia & osteopenia) seemingly preclude surgical approach

## 2015-02-06 NOTE — Assessment & Plan Note (Signed)
Mental status screening WNL 12/26/14 and 02/05/15

## 2015-02-06 NOTE — Assessment & Plan Note (Signed)
Trial of Cymbalta

## 2015-02-08 ENCOUNTER — Telehealth: Payer: Self-pay | Admitting: Pulmonary Disease

## 2015-02-08 NOTE — Telephone Encounter (Signed)
Initiated PA through [email or web address] for [facility] [id number] Key : [id number] Call back to check status, [phone] Determination within 5 business days

## 2015-02-12 NOTE — Telephone Encounter (Signed)
Lisa Larson has been approved through 02/08/16 Filed under Medicare Part D.  Pt aware.

## 2015-02-13 ENCOUNTER — Telehealth: Payer: Self-pay

## 2015-02-13 NOTE — Telephone Encounter (Signed)
Phone call from patient checking to see if our office received a form and records from [facility] to give info to Dr [name]. 01/10/15 office note is scanned in chart as well as HIPPA form from them.

## 2015-02-14 NOTE — Telephone Encounter (Signed)
Dr Randel Pigg 01/13/14 records reviewed & info entered into her EMR record for continuity of care

## 2015-02-15 ENCOUNTER — Other Ambulatory Visit: Payer: Self-pay | Admitting: Internal Medicine

## 2015-02-15 NOTE — Telephone Encounter (Signed)
If you are staying nauseated ;you should be checked & consideration given to medication adjustment. It is not wize simply to take nausea medication. You may possibly need further testing.

## 2015-02-26 ENCOUNTER — Ambulatory Visit: Payer: Medicare Other | Admitting: Pulmonary Disease

## 2015-02-27 ENCOUNTER — Other Ambulatory Visit: Payer: Self-pay | Admitting: Internal Medicine

## 2015-02-28 ENCOUNTER — Other Ambulatory Visit: Payer: Self-pay | Admitting: Internal Medicine

## 2015-02-28 MED ORDER — GABAPENTIN 100 MG PO CAPS: ORAL_CAPSULE | ORAL | Status: DC

## 2015-02-28 MED ORDER — ONDANSETRON HCL 4 MG PO TABS: 4.0000 mg | ORAL_TABLET | Freq: Three times a day (TID) | ORAL | Status: DC | PRN

## 2015-02-28 NOTE — Telephone Encounter (Signed)
Left msg on triage stating needing refills on her gabapentin and ondanstron. Pt had also sent mychart msg. Replied back to pt stating refills has been sent back to [facility]...Lisa Larson

## 2015-03-07 DIAGNOSIS — M5442 Lumbago with sciatica, left side: Secondary | ICD-10-CM | POA: Diagnosis not present

## 2015-03-11 ENCOUNTER — Other Ambulatory Visit: Payer: Self-pay | Admitting: Internal Medicine

## 2015-03-15 DIAGNOSIS — Z6831 Body mass index (BMI) 31.0-31.9, adult: Secondary | ICD-10-CM | POA: Diagnosis not present

## 2015-03-15 DIAGNOSIS — I10 Essential (primary) hypertension: Secondary | ICD-10-CM | POA: Diagnosis not present

## 2015-03-15 DIAGNOSIS — S12112A Nondisplaced Type II dens fracture, initial encounter for closed fracture: Secondary | ICD-10-CM | POA: Diagnosis not present

## 2015-03-21 ENCOUNTER — Encounter: Payer: Self-pay | Admitting: Internal Medicine

## 2015-03-21 ENCOUNTER — Ambulatory Visit: Payer: Medicare Other | Admitting: Pulmonary Disease

## 2015-03-22 ENCOUNTER — Other Ambulatory Visit: Payer: Self-pay | Admitting: Internal Medicine

## 2015-03-22 NOTE — Telephone Encounter (Signed)
Lorazepam has been called to [facility] on [address]

## 2015-03-22 NOTE — Telephone Encounter (Signed)
OK X1 

## 2015-03-22 NOTE — Telephone Encounter (Signed)
2.8.16 last seen by you

## 2015-03-24 ENCOUNTER — Emergency Department (HOSPITAL_COMMUNITY): Payer: Medicare Other

## 2015-03-24 ENCOUNTER — Encounter: Payer: Self-pay | Admitting: Family Medicine

## 2015-03-24 ENCOUNTER — Encounter (HOSPITAL_COMMUNITY): Payer: Self-pay

## 2015-03-24 ENCOUNTER — Ambulatory Visit (INDEPENDENT_AMBULATORY_CARE_PROVIDER_SITE_OTHER): Payer: Medicare Other | Admitting: Family Medicine

## 2015-03-24 ENCOUNTER — Observation Stay (HOSPITAL_COMMUNITY): Payer: Medicare Other

## 2015-03-24 ENCOUNTER — Observation Stay (HOSPITAL_COMMUNITY)
Admission: EM | Admit: 2015-03-24 | Discharge: 2015-03-25 | Disposition: A | Discharge status: Home / Self Care | Payer: Medicare Other | Attending: Internal Medicine | Admitting: Internal Medicine

## 2015-03-24 VITALS — BP 132/78 | HR 84 | Temp 98.1°F | Wt 161.0 lb

## 2015-03-24 DIAGNOSIS — J45909 Unspecified asthma, uncomplicated: Secondary | ICD-10-CM | POA: Insufficient documentation

## 2015-03-24 DIAGNOSIS — J45998 Other asthma: Secondary | ICD-10-CM | POA: Diagnosis not present

## 2015-03-24 DIAGNOSIS — M8580 Other specified disorders of bone density and structure, unspecified site: Secondary | ICD-10-CM | POA: Insufficient documentation

## 2015-03-24 DIAGNOSIS — F32A Depression, unspecified: Secondary | ICD-10-CM | POA: Diagnosis present

## 2015-03-24 DIAGNOSIS — Z886 Allergy status to analgesic agent status: Secondary | ICD-10-CM | POA: Diagnosis not present

## 2015-03-24 DIAGNOSIS — I10 Essential (primary) hypertension: Secondary | ICD-10-CM

## 2015-03-24 DIAGNOSIS — R52 Pain, unspecified: Secondary | ICD-10-CM | POA: Insufficient documentation

## 2015-03-24 DIAGNOSIS — Z7952 Long term (current) use of systemic steroids: Secondary | ICD-10-CM | POA: Insufficient documentation

## 2015-03-24 DIAGNOSIS — Z9841 Cataract extraction status, right eye: Secondary | ICD-10-CM | POA: Insufficient documentation

## 2015-03-24 DIAGNOSIS — Z9049 Acquired absence of other specified parts of digestive tract: Secondary | ICD-10-CM | POA: Insufficient documentation

## 2015-03-24 DIAGNOSIS — F419 Anxiety disorder, unspecified: Secondary | ICD-10-CM | POA: Diagnosis not present

## 2015-03-24 DIAGNOSIS — Z9842 Cataract extraction status, left eye: Secondary | ICD-10-CM | POA: Insufficient documentation

## 2015-03-24 DIAGNOSIS — Z90710 Acquired absence of both cervix and uterus: Secondary | ICD-10-CM | POA: Insufficient documentation

## 2015-03-24 DIAGNOSIS — J449 Chronic obstructive pulmonary disease, unspecified: Secondary | ICD-10-CM | POA: Insufficient documentation

## 2015-03-24 DIAGNOSIS — F1099 Alcohol use, unspecified with unspecified alcohol-induced disorder: Secondary | ICD-10-CM | POA: Insufficient documentation

## 2015-03-24 DIAGNOSIS — E876 Hypokalemia: Secondary | ICD-10-CM | POA: Diagnosis not present

## 2015-03-24 DIAGNOSIS — E785 Hyperlipidemia, unspecified: Secondary | ICD-10-CM | POA: Diagnosis not present

## 2015-03-24 DIAGNOSIS — K8020 Calculus of gallbladder without cholecystitis without obstruction: Secondary | ICD-10-CM | POA: Insufficient documentation

## 2015-03-24 DIAGNOSIS — D696 Thrombocytopenia, unspecified: Secondary | ICD-10-CM | POA: Diagnosis not present

## 2015-03-24 DIAGNOSIS — R079 Chest pain, unspecified: Secondary | ICD-10-CM

## 2015-03-24 DIAGNOSIS — Z8701 Personal history of pneumonia (recurrent): Secondary | ICD-10-CM | POA: Diagnosis not present

## 2015-03-24 DIAGNOSIS — Z882 Allergy status to sulfonamides status: Secondary | ICD-10-CM | POA: Insufficient documentation

## 2015-03-24 DIAGNOSIS — Z888 Allergy status to other drugs, medicaments and biological substances status: Secondary | ICD-10-CM | POA: Diagnosis not present

## 2015-03-24 DIAGNOSIS — R748 Abnormal levels of other serum enzymes: Secondary | ICD-10-CM

## 2015-03-24 DIAGNOSIS — G894 Chronic pain syndrome: Secondary | ICD-10-CM | POA: Diagnosis not present

## 2015-03-24 DIAGNOSIS — R918 Other nonspecific abnormal finding of lung field: Secondary | ICD-10-CM | POA: Insufficient documentation

## 2015-03-24 DIAGNOSIS — K219 Gastro-esophageal reflux disease without esophagitis: Secondary | ICD-10-CM

## 2015-03-24 DIAGNOSIS — R0789 Other chest pain: Secondary | ICD-10-CM | POA: Diagnosis not present

## 2015-03-24 DIAGNOSIS — Z87891 Personal history of nicotine dependence: Secondary | ICD-10-CM | POA: Diagnosis not present

## 2015-03-24 DIAGNOSIS — F329 Major depressive disorder, single episode, unspecified: Secondary | ICD-10-CM | POA: Insufficient documentation

## 2015-03-24 LAB — CBC
HCT: 37.2 % (ref 36.0–46.0)
Hemoglobin: 12.3 g/dL (ref 12.0–15.0)
MCH: 31.5 pg (ref 26.0–34.0)
MCHC: 33.1 g/dL (ref 30.0–36.0)
MCV: 95.1 fL (ref 78.0–100.0)
Platelets: 144 10*3/uL — ABNORMAL LOW (ref 150–400)
RBC: 3.91 MIL/uL (ref 3.87–5.11)
RDW: 15.2 % (ref 11.5–15.5)
WBC: 7.9 10*3/uL (ref 4.0–10.5)

## 2015-03-24 LAB — URINALYSIS, ROUTINE W REFLEX MICROSCOPIC
Bilirubin Urine: NEGATIVE
Glucose, UA: NEGATIVE mg/dL
Hgb urine dipstick: NEGATIVE
Ketones, ur: NEGATIVE mg/dL
Nitrite: NEGATIVE
Protein, ur: NEGATIVE mg/dL
Specific Gravity, Urine: 1.017 (ref 1.005–1.030)
Urobilinogen, UA: 0.2 mg/dL (ref 0.0–1.0)
pH: 6 (ref 5.0–8.0)

## 2015-03-24 LAB — BASIC METABOLIC PANEL
Anion gap: 11 (ref 5–15)
BUN: 15 mg/dL (ref 6–23)
CO2: 22 mmol/L (ref 19–32)
Calcium: 8.8 mg/dL (ref 8.4–10.5)
Chloride: 105 mmol/L (ref 96–112)
Creatinine, Ser: 0.9 mg/dL (ref 0.50–1.10)
GFR calc Af Amer: 73 mL/min — ABNORMAL LOW (ref >90)
GFR calc non Af Amer: 63 mL/min — ABNORMAL LOW (ref >90)
Glucose, Bld: 102 mg/dL — ABNORMAL HIGH (ref 70–99)
Potassium: 3.3 mmol/L — ABNORMAL LOW (ref 3.5–5.1)
Sodium: 138 mmol/L (ref 135–145)

## 2015-03-24 LAB — I-STAT TROPONIN, ED: Troponin i, poc: 0.02 ng/mL (ref 0.00–0.08)

## 2015-03-24 LAB — URINE MICROSCOPIC-ADD ON

## 2015-03-24 LAB — HEPATIC FUNCTION PANEL
ALT: 53 U/L — ABNORMAL HIGH (ref 0–35)
AST: 53 U/L — ABNORMAL HIGH (ref 0–37)
Albumin: 4.1 g/dL (ref 3.5–5.2)
Alkaline Phosphatase: 49 U/L (ref 39–117)
Bilirubin, Direct: 0.3 mg/dL (ref 0.0–0.5)
Indirect Bilirubin: 0.8 mg/dL (ref 0.3–0.9)
Total Bilirubin: 1.1 mg/dL (ref 0.3–1.2)
Total Protein: 6.7 g/dL (ref 6.0–8.3)

## 2015-03-24 LAB — TROPONIN I
Troponin I: <0.03 ng/mL (ref <0.031)
Troponin I: <0.03 ng/mL (ref <0.031)

## 2015-03-24 LAB — LIPASE, BLOOD: Lipase: 102 U/L — ABNORMAL HIGH (ref 11–59)

## 2015-03-24 MED ORDER — PREDNISONE 10 MG PO TABS
10.0000 mg | ORAL_TABLET | Freq: Every day | ORAL | Status: DC
  Administered 2015-03-25: 10 mg via ORAL
  Filled 2015-03-24: qty 1

## 2015-03-24 MED ORDER — LORATADINE 10 MG PO TABS
10.0000 mg | ORAL_TABLET | Freq: Every day | ORAL | Status: DC
  Administered 2015-03-24: 10 mg via ORAL
  Filled 2015-03-24 (×2): qty 1

## 2015-03-24 MED ORDER — ONDANSETRON HCL 4 MG/2ML IJ SOLN: 4.0000 mg | Freq: Four times a day (QID) | INTRAMUSCULAR | Status: DC | PRN

## 2015-03-24 MED ORDER — LORAZEPAM 0.5 MG PO TABS
0.5000 mg | ORAL_TABLET | Freq: Four times a day (QID) | ORAL | Status: DC | PRN
Start: 2015-03-24 — End: 2015-03-25
  Administered 2015-03-24 – 2015-03-25 (×2): 0.5 mg via ORAL
  Filled 2015-03-24 (×2): qty 1

## 2015-03-24 MED ORDER — VITAMIN B-1 50 MG PO TABS
50.0000 mg | ORAL_TABLET | Freq: Every day | ORAL | Status: DC
  Administered 2015-03-24 – 2015-03-25 (×2): 50 mg via ORAL
  Filled 2015-03-24 (×2): qty 1

## 2015-03-24 MED ORDER — ALUM & MAG HYDROXIDE-SIMETH 200-200-20 MG/5ML PO SUSP: 30.0000 mL | Freq: Four times a day (QID) | ORAL | Status: DC | PRN

## 2015-03-24 MED ORDER — ONDANSETRON HCL 4 MG PO TABS: 4.0000 mg | ORAL_TABLET | Freq: Four times a day (QID) | ORAL | Status: DC | PRN

## 2015-03-24 MED ORDER — MOMETASONE FURO-FORMOTEROL FUM 100-5 MCG/ACT IN AERO
2.0000 | INHALATION_SPRAY | Freq: Two times a day (BID) | RESPIRATORY_TRACT | Status: DC
Start: 2015-03-24 — End: 2015-03-25
  Filled 2015-03-24: qty 8.8

## 2015-03-24 MED ORDER — CALCIUM CARBONATE 1250 (500 CA) MG PO TABS
1250.0000 mg | ORAL_TABLET | Freq: Two times a day (BID) | ORAL | Status: DC
  Administered 2015-03-24 – 2015-03-25 (×2): 1250 mg via ORAL
  Filled 2015-03-24 (×3): qty 1

## 2015-03-24 MED ORDER — POTASSIUM CHLORIDE IN NACL 20-0.9 MEQ/L-% IV SOLN
INTRAVENOUS | Status: DC
  Administered 2015-03-24 – 2015-03-25 (×2): via INTRAVENOUS
  Filled 2015-03-24 (×3): qty 1000

## 2015-03-24 MED ORDER — MORPHINE SULFATE 2 MG/ML IJ SOLN: 2.0000 mg | INTRAMUSCULAR | Status: DC | PRN

## 2015-03-24 MED ORDER — ASPIRIN EC 81 MG PO TBEC
81.0000 mg | DELAYED_RELEASE_TABLET | Freq: Every day | ORAL | Status: DC
  Administered 2015-03-24 – 2015-03-25 (×2): 81 mg via ORAL
  Filled 2015-03-24 (×2): qty 1

## 2015-03-24 MED ORDER — LOSARTAN POTASSIUM 50 MG PO TABS
100.0000 mg | ORAL_TABLET | Freq: Every day | ORAL | Status: DC
  Administered 2015-03-24: 100 mg via ORAL
  Filled 2015-03-24 (×2): qty 2

## 2015-03-24 MED ORDER — FLUTICASONE PROPIONATE 50 MCG/ACT NA SUSP
2.0000 | Freq: Every day | NASAL | Status: DC
  Administered 2015-03-24 – 2015-03-25 (×2): 2 via NASAL
  Filled 2015-03-24: qty 16

## 2015-03-24 MED ORDER — SODIUM CHLORIDE 0.9 % IJ SOLN
3.0000 mL | Freq: Two times a day (BID) | INTRAMUSCULAR | Status: DC
  Administered 2015-03-25: 3 mL via INTRAVENOUS

## 2015-03-24 MED ORDER — HYDROCODONE-ACETAMINOPHEN 5-325 MG PO TABS
1.0000 | ORAL_TABLET | Freq: Four times a day (QID) | ORAL | Status: DC | PRN
  Administered 2015-03-24 – 2015-03-25 (×2): 1 via ORAL
  Filled 2015-03-24 (×2): qty 1

## 2015-03-24 MED ORDER — ACETAMINOPHEN 325 MG PO TABS: 650.0000 mg | ORAL_TABLET | Freq: Four times a day (QID) | ORAL | Status: DC | PRN

## 2015-03-24 MED ORDER — ENOXAPARIN SODIUM 40 MG/0.4ML ~~LOC~~ SOLN
40.0000 mg | SUBCUTANEOUS | Status: DC
  Administered 2015-03-24: 40 mg via SUBCUTANEOUS
  Filled 2015-03-24 (×2): qty 0.4

## 2015-03-24 MED ORDER — PANTOPRAZOLE SODIUM 40 MG IV SOLR
40.0000 mg | INTRAVENOUS | Status: DC
  Administered 2015-03-24: 40 mg via INTRAVENOUS
  Filled 2015-03-24 (×2): qty 40

## 2015-03-24 MED ORDER — VITAMIN D 1000 UNITS PO TABS
2000.0000 [IU] | ORAL_TABLET | Freq: Every day | ORAL | Status: DC
Start: 2015-03-24 — End: 2015-03-25
  Administered 2015-03-24 – 2015-03-25 (×2): 2000 [IU] via ORAL
  Filled 2015-03-24 (×2): qty 2

## 2015-03-24 MED ORDER — DULOXETINE HCL 30 MG PO CPEP
30.0000 mg | ORAL_CAPSULE | Freq: Every day | ORAL | Status: DC
  Administered 2015-03-25: 30 mg via ORAL
  Filled 2015-03-24: qty 1

## 2015-03-24 MED ORDER — ACETAMINOPHEN 650 MG RE SUPP: 650.0000 mg | Freq: Four times a day (QID) | RECTAL | Status: DC | PRN

## 2015-03-24 MED ORDER — ALBUTEROL SULFATE (2.5 MG/3ML) 0.083% IN NEBU: 3.0000 mL | INHALATION_SOLUTION | Freq: Four times a day (QID) | RESPIRATORY_TRACT | Status: DC | PRN
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
## 2015-03-24 NOTE — ED Provider Notes (Signed)
CSN: [id number]     Arrival date & time 03/24/15  1010 History   First MD Initiated Contact with Patient 03/24/15 1105     Chief Complaint  Patient presents with  . Hypertension  . Chest Pain     (Consider location/radiation/quality/duration/timing/severity/associated sxs/prior Treatment) HPI  The patient is a 74 y/o female with hypertension who presents to the emergency department with multiple complaints, including chest and abdominal pain. For the past week, she has had a gradual onset of intermittent chest pain lasting several minutes at a time. She has not identified any relieving or aggravating factors. Her abdominal discomfort began more recently, within the last day. It is located in the epigastric area and bilateral upper quadrants. She does not feel it in her back. She endorses fatigue, cold sweats, anorexia, nausea, headache, diarrhea. The diarrhea was noted last night and this morning, was yellow in color, and very foul-smelling. Her blood pressure has been high for the past week, checked at home, with no change in BP medication. She has also had acid reflux worse from her baseline in the past week. She denies fever, vomiting, dysuria, hematuria, hematochezia.   She is a previous smoker, quit many years ago. She drinks 3-4 glasses of wine/night but has not for the past week due to feeling sick.  Past Medical History  Diagnosis Date  . 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 Asthma   . Hypertension   . Osteopenia     BMD T score-1.6 at L femoral neck 11-27-2009, s/p fosamax x 5 years  . GERD (gastroesophageal reflux disease)   . Depression   . Hyperlipidemia   . Anxiety   . Baker's cyst, ruptured 2012    right  . Arthritis   . Chronic headaches   . COPD (chronic obstructive pulmonary disease)   . Status post dilation of esophageal narrowing   . Lipoma   . Shoulder fracture, left 08/14/2013     Dr Marlou Sa   Past Surgical History  Procedure Laterality Date  . Shoulder  surgery Left 08-2008    fracture repair, Dr. Frederik Pear  . Total abdominal hysterectomy    . Tonsillectomy and adenoidectomy    . Colonoscopy with polypectomy  06/2013  . Nasal sinus surgery    . Appendectomy    . Esophageal dilation      Dr Olevia Perches  . Bronchoscopy  12-2010    Dr. Halford Chessman  . Cataract extraction w/ intraocular lens  implant, bilateral Bilateral    Family History  Problem Relation Age of Onset  . Arthritis Father   . Rheum arthritis Father   . Hypertension Father   . Hypertension Mother   . Alzheimer's disease Mother   . Hypertension Brother   . Diabetes Brother   . Cancer Son     laryngeal  . Stroke Neg Hx   . Other Son     trigeminal neuralgia  . Colon cancer Neg Hx   . Non-Hodgkin's lymphoma Brother   . Colitis Mother   . Irritable bowel syndrome Mother   . Heart attack Paternal Grandmother   . Diabetes Paternal Grandmother    History  Substance Use Topics  . Smoking status: Former Smoker -- 1.00 packs/day for 15 years    Types: Cigarettes    Quit date: 12/29/1968  . Smokeless tobacco: Never Used     Comment: smoked 1966- ? 1970, up to 1 ppd  . Alcohol Use: 16.8 oz/week    28 Glasses of wine per  week     Comment: 4 a day or more   OB History    No data available     Review of Systems  All other systems negative except as documented in the HPI. All pertinent positives and negatives as reviewed in the HPI.  Allergies  Sulfonamide derivatives; Ace inhibitors; Benazepril hcl; Diclofenac; Oxycodone-aspirin; and Rofecoxib  Home Medications   Prior to Admission medications   Medication Sig Start Date End Date Taking? Authorizing Provider  albuterol (PROAIR HFA) 108 (90 BASE) MCG/ACT inhaler Inhale 2 puffs into the lungs every 6 (six) hours as needed. 12/19/14  Yes Hendricks Limes, MD  calcium carbonate (OS-CAL) 600 MG TABS tablet Take 600 mg by mouth 2 (two) times daily with a meal.   Yes Historical Provider, MD  Cetirizine HCl 10 MG CAPS Take 10 mg  by mouth daily. 12/07/12  Yes [name], MD  Cholecalciferol (VITAMIN D3) 2000 UNITS capsule Take 2,000 Units by mouth daily.   Yes Historical Provider, MD  DULoxetine (CYMBALTA) 30 MG capsule Take 1 capsule (30 mg total) by mouth daily. 02/05/15  Yes Hendricks Limes, MD  fluticasone (FLONASE) 50 MCG/ACT nasal spray Place 2 sprays into both nostrils daily. 01/31/15  Yes [name], MD  gabapentin (NEURONTIN) 100 MG capsule TAKE 1 CAPSULE BY MOUTH EVERY 8 HOURS AS NEEDED FOR LEG PAIN 02/28/15  Yes Hendricks Limes, MD  HYDROcodone-acetaminophen (NORCO/VICODIN) 5-325 MG per tablet Take 1 tablet by mouth every 6 (six) hours as needed for moderate pain.   Yes Historical Provider, MD  LORazepam (ATIVAN) 1 MG tablet TAKE 1/2 TO 1 TABLET BY MOUTH EVERY 8 HOURS AS NEEDED. DO NOT TAKE WITHIN 8-12 HOURS OF ALCOHOL 03/22/15  Yes Hendricks Limes, MD  losartan (COZAAR) 100 MG tablet TAKE 1 TABLET BY MOUTH EVERY DAY 03/12/15  Yes Hendricks Limes, MD  mometasone-formoterol [facility]) 100-5 MCG/ACT AERO Inhale 2 puffs into the lungs 2 (two) times daily. 01/31/15  Yes [name], MD  omeprazole (PRILOSEC) 20 MG capsule Take 1 capsule (20 mg total) by mouth daily. Patient taking differently: Take 20 mg by mouth daily as needed (acid reflux).  11/02/14  Yes Hendricks Limes, MD  ondansetron (ZOFRAN) 4 MG tablet Take 1 tablet (4 mg total) by mouth every 8 (eight) hours as needed. for nausea 02/28/15  Yes Hendricks Limes, MD  predniSONE (DELTASONE) 10 MG tablet Take 10 mg by mouth daily with breakfast.   Yes Historical Provider, MD  Probiotic Product ([place]) Take 1 capsule by mouth daily.   Yes Historical Provider, MD  thiamine (VITAMIN B-1) 100 MG tablet Take 50 mg by mouth daily.    Yes Historical Provider, MD   BP 122/67 mmHg  Pulse 90  Temp(Src) 97.5 F (36.4 C) (Oral)  Resp 14  SpO2 99% Physical Exam  Constitutional: She is oriented to person, place, and time. She appears well-developed and  well-nourished. She appears distressed.  HENT:  Head: Normocephalic and atraumatic.  Eyes: EOM are normal. Pupils are equal, round, and reactive to light.  Cardiovascular: Normal rate, regular rhythm, normal heart sounds and intact distal pulses.  Exam reveals no gallop and no friction rub.   No murmur heard. Pulmonary/Chest: Effort normal and breath sounds normal. No respiratory distress.  Abdominal: Soft. Normal appearance, normal aorta and bowel sounds are normal. She exhibits no pulsatile midline mass. There is tenderness in the right upper quadrant, right lower quadrant, epigastric area and left  upper quadrant. There is guarding. There is no rigidity and no rebound.  Musculoskeletal: Normal range of motion. She exhibits no edema or tenderness.  Neurological: She is alert and oriented to person, place, and time.  Skin: Skin is warm and dry. No rash noted. She is not diaphoretic. No erythema.  Psychiatric: She has a normal mood and affect.    ED Course  Procedures (including critical care time) Labs Review Labs Reviewed  CBC - Abnormal; Notable for the following:    Platelets 144 (*)    All other components within normal limits  BASIC METABOLIC PANEL - Abnormal; Notable for the following:    Potassium 3.3 (*)    Glucose, Bld 102 (*)    GFR calc non Af Amer 63 (*)    GFR calc Af Amer 73 (*)    All other components within normal limits  I-STAT TROPOININ, ED    Imaging Review Dg Chest 2 View  03/24/2015   CLINICAL DATA:  Five day history of chest pain  EXAM: CHEST  2 VIEW  COMPARISON:  January 31, 2015  FINDINGS: There is right middle lobe infiltrate with volume loss, better seen on the lateral view. There is mild scarring in the left base. Lungs elsewhere clear. Heart size and pulmonary vascularity are normal. No adenopathy. No pneumothorax. Bones are osteoporotic.  IMPRESSION: Right middle lobe infiltrate.  Mild scarring left base.   Electronically Signed   By: Lowella Grip III  M.D.   On: 03/24/2015 12:45     EKG Interpretation   Date/Time:  Saturday March 24 2015 10:27:37 EDT Ventricular Rate:  90 PR Interval:  117 QRS Duration: 85 QT Interval:  340 QTC Calculation: 416 R Axis:   37 Text Interpretation:  Sinus rhythm Borderline short PR interval Low  voltage, precordial leads No previous ECGs available Confirmed by Wyvonnia Dusky   MD, STEPHEN [id number]) on 03/24/2015 10:30:31 AM      Spoke with the [facility] who will admit the patient for further evaluation and care   Dalia Heading, PA-C 03/24/15 [place], MD 03/25/15 [phone]

## 2015-03-24 NOTE — Assessment & Plan Note (Signed)
New with multiple complaints. Explained to pt and her son that we could check UA and EKG here but if she is feeling as bad as she says and her son is concerned about her mental and cardiac status, she should go to the ER for full work up. They agreed and are going to [facility] now.

## 2015-03-24 NOTE — ED Notes (Signed)
Dr. Rockne Menghini is with pt. At this time--will transport asap.  Pt. Remains in no distress.  Her son has been, and continues to be with her.

## 2015-03-24 NOTE — ED Notes (Signed)
Per pt, has had multiple complaints for last week.  Chest pain, nausea, cold sweat, reflux, fatigue, high blood pressure, decreased appetite, dehydration, headache.  Pt went to [facility] this morning but told that it would be quicker to come here for an EKG than trying to get one there.  Pt does complain left upper shoulder and arm pain today.
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
## 2015-03-24 NOTE — ED Notes (Signed)
Upon first attempt to take pt. To her room on $ [place], the hospitalist was with her.  Now the ultrasonographer is here and beginning her abd. U/s.  She remains in no distress.

## 2015-03-25 DIAGNOSIS — R0789 Other chest pain: Secondary | ICD-10-CM | POA: Diagnosis not present

## 2015-03-25 DIAGNOSIS — E876 Hypokalemia: Secondary | ICD-10-CM | POA: Diagnosis not present

## 2015-03-25 DIAGNOSIS — R748 Abnormal levels of other serum enzymes: Secondary | ICD-10-CM | POA: Diagnosis not present

## 2015-03-25 LAB — BASIC METABOLIC PANEL
Anion gap: 5 (ref 5–15)
BUN: 8 mg/dL (ref 6–23)
CO2: 25 mmol/L (ref 19–32)
Calcium: 9 mg/dL (ref 8.4–10.5)
Chloride: 112 mmol/L (ref 96–112)
Creatinine, Ser: 0.83 mg/dL (ref 0.50–1.10)
GFR calc Af Amer: 80 mL/min — ABNORMAL LOW (ref >90)
GFR calc non Af Amer: 69 mL/min — ABNORMAL LOW (ref >90)
Glucose, Bld: 92 mg/dL (ref 70–99)
Potassium: 4.2 mmol/L (ref 3.5–5.1)
Sodium: 142 mmol/L (ref 135–145)

## 2015-03-25 LAB — TROPONIN I: Troponin I: <0.03 ng/mL (ref <0.031)

## 2015-03-25 LAB — LIPASE, BLOOD: Lipase: 30 U/L (ref 11–59)

## 2015-03-25 MED ORDER — ASPIRIN 81 MG PO TBEC: 81.0000 mg | DELAYED_RELEASE_TABLET | Freq: Every day | ORAL | Status: DC

## 2015-03-25 MED ORDER — POTASSIUM CHLORIDE CRYS ER 20 MEQ PO TBCR
40.0000 meq | EXTENDED_RELEASE_TABLET | Freq: Once | ORAL | Status: AC
  Administered 2015-03-25: 40 meq via ORAL
  Filled 2015-03-25: qty 2

## 2015-03-25 MED ORDER — OMEPRAZOLE 20 MG PO CPDR: 20.0000 mg | DELAYED_RELEASE_CAPSULE | Freq: Every day | ORAL | Status: DC

## 2015-03-25 NOTE — Evaluation (Signed)
Physical Therapy Evaluation Patient Details Name: Lisa Larson MRN: [id number] DOB: [birth year] Today's Date: 03/25/2015   History of Present Illness     Clinical Impression  Pt very pleasant and motivated.  Pt performing all mobility tasks including amb fwd, bkwd and sideways without assist. Pt demonstrating good safety awareness and good stability with use of SPC.  Pt plans dc home this date with intermittent assist of family.    Follow Up Recommendations No PT follow up    Equipment Recommendations  None recommended by PT    Recommendations for Other Services       Precautions / Restrictions Precautions Precautions: None Restrictions Weight Bearing Restrictions: No      Mobility  Bed Mobility Overal bed mobility: Independent                Transfers Overall transfer level: Modified independent Equipment used: Straight cane                Ambulation/Gait Ambulation/Gait assistance: Supervision;Modified independent (Device/Increase time) Ambulation Distance (Feet): 400 Feet Assistive device: None;Straight cane Gait Pattern/deviations: Step-through pattern;Antalgic Gait velocity: mod pace    General Gait Details: Pt ambulated unassisted with SPC.  Pt able to ambulate sans assist device but with noted increased antalgic gait.  Pt perfoms back step and side step with decreased speed but demonstrating good stability and saftey awareness.  Stairs            Wheelchair Mobility    Modified Rankin (Stroke Patients Only)       Balance Overall balance assessment: No apparent balance deficits (not formally assessed)                                           Pertinent Vitals/Pain Pain Assessment: No/denies pain    Home Living Family/patient expects to be discharged to:: Private residence Living Arrangements: Alone Available Help at Discharge: Family;Available PRN/intermittently Type of Home: House Home Access: Ramped  entrance     Home Layout: One level Home Equipment: Walker - 2 wheels;Cane - single point      Prior Function Level of Independence: Independent;Independent with assistive device(s)         Comments: Utilized SPC "unless I forgot it"     Hand Dominance        Extremity/Trunk Assessment   Upper Extremity Assessment: Overall WFL for tasks assessed           Lower Extremity Assessment: Overall WFL for tasks assessed         Communication   Communication: No difficulties  Cognition Arousal/Alertness: Awake/alert Behavior During Therapy: WFL for tasks assessed/performed Overall Cognitive Status: Within Functional Limits for tasks assessed                      General Comments      Exercises        Assessment/Plan    PT Assessment Patent does not need any further PT services  PT Diagnosis Difficulty walking   PT Problem List    PT Treatment Interventions     PT Goals (Current goals can be found in the Care Plan section) Acute Rehab PT Goals Patient Stated Goal: HOME    Frequency     Barriers to discharge        Co-evaluation  End of Session Equipment Utilized During Treatment: Gait belt Activity Tolerance: Patient tolerated treatment well Patient left: in bed;with call bell/phone within reach;with family/visitor present Nurse Communication: Mobility status    Functional Assessment Tool Used: Clincal judgement Functional Limitation: Mobility: Walking and moving around Mobility: Walking and Moving Around Current Status ([id number]): At least 1 percent but less than 20 percent impaired, limited or restricted Mobility: Walking and Moving Around Goal Status [phone]): At least 1 percent but less than 20 percent impaired, limited or restricted Mobility: Walking and Moving Around Discharge Status [phone]): At least 1 percent but less than 20 percent impaired, limited or restricted    Time: 1240-1258 PT Time Calculation (min)  (ACUTE ONLY): 18 min   Charges:   PT Evaluation $Initial PT Evaluation Tier I: 1 Procedure     PT G Codes:   PT G-Codes **NOT FOR INPATIENT CLASS** Functional Assessment Tool Used: Clincal judgement Functional Limitation: Mobility: Walking and moving around Mobility: Walking and Moving Around Current Status ([id number]): At least 1 percent but less than 20 percent impaired, limited or restricted Mobility: Walking and Moving Around Goal Status [phone]): At least 1 percent but less than 20 percent impaired, limited or restricted Mobility: Walking and Moving Around Discharge Status [phone]): At least 1 percent but less than 20 percent impaired, limited or restricted    Lisa Larson 03/25/2015, 2:50 PM
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
## 2015-03-25 NOTE — Discharge Instructions (Signed)

## 2015-03-25 NOTE — Progress Notes (Signed)
UR completed 

## 2015-03-26 ENCOUNTER — Telehealth: Payer: Self-pay | Admitting: *Deleted

## 2015-03-26 NOTE — Telephone Encounter (Signed)
Transition Care Management Follow-up Telephone Call D/ C 03/25/15  How have you been since you were released from the hospital? Pt statts she is doing ok   Do you understand why you were in the hospital? YES   Do you understand the discharge instrcutions? YES  Items Reviewed:  Medications reviewed: YES  Allergies reviewed: NO  Dietary changes reviewed: YES, heart healthy  Referrals reviewed: No referral recommended   Functional Questionnaire:   Activities of Daily Living (ADLs):   She states they are independent in the following: ambulation, bathing and hygiene, feeding, continence, grooming, toileting and dressing States she doesn't require assistance     Any transportation issues/concerns?: NO   Any patient concerns? NO   Confirmed importance and date/time of follow-up visits scheduled: YES, made appt w/Dr. [name] 03/28/15   Confirmed with patient if condition begins to worsen call PCP or go to the ER.

## 2015-03-27 DIAGNOSIS — M4726 Other spondylosis with radiculopathy, lumbar region: Secondary | ICD-10-CM | POA: Diagnosis not present

## 2015-03-27 DIAGNOSIS — M5442 Lumbago with sciatica, left side: Secondary | ICD-10-CM | POA: Diagnosis not present

## 2015-03-28 ENCOUNTER — Encounter: Payer: Self-pay | Admitting: Internal Medicine

## 2015-03-28 ENCOUNTER — Ambulatory Visit (INDEPENDENT_AMBULATORY_CARE_PROVIDER_SITE_OTHER): Payer: Medicare Other | Admitting: Internal Medicine

## 2015-03-28 ENCOUNTER — Ambulatory Visit (INDEPENDENT_AMBULATORY_CARE_PROVIDER_SITE_OTHER)
Admission: RE | Admit: 2015-03-28 | Discharge: 2015-03-28 | Disposition: A | Discharge status: Home / Self Care | Payer: Medicare Other | Source: Ambulatory Visit | Attending: Internal Medicine | Admitting: Internal Medicine

## 2015-03-28 ENCOUNTER — Other Ambulatory Visit (INDEPENDENT_AMBULATORY_CARE_PROVIDER_SITE_OTHER): Payer: Medicare Other

## 2015-03-28 VITALS — BP 124/80 | HR 104 | Temp 98.1°F | Resp 16 | Ht 60.0 in | Wt 161.5 lb

## 2015-03-28 DIAGNOSIS — J984 Other disorders of lung: Secondary | ICD-10-CM | POA: Diagnosis not present

## 2015-03-28 DIAGNOSIS — E876 Hypokalemia: Secondary | ICD-10-CM

## 2015-03-28 DIAGNOSIS — R938 Abnormal findings on diagnostic imaging of other specified body structures: Secondary | ICD-10-CM | POA: Diagnosis not present

## 2015-03-28 DIAGNOSIS — R748 Abnormal levels of other serum enzymes: Secondary | ICD-10-CM

## 2015-03-28 DIAGNOSIS — R9389 Abnormal findings on diagnostic imaging of other specified body structures: Secondary | ICD-10-CM

## 2015-03-28 LAB — CBC WITH DIFFERENTIAL/PLATELET
Basophils Absolute: 0 10*3/uL (ref 0.0–0.1)
Basophils Relative: 0 % (ref 0.0–3.0)
Eosinophils Absolute: 0 10*3/uL (ref 0.0–0.7)
Eosinophils Relative: 0 % (ref 0.0–5.0)
HCT: 36 % (ref 36.0–46.0)
Hemoglobin: 12.6 g/dL (ref 12.0–15.0)
Lymphocytes Relative: 6.6 % — ABNORMAL LOW (ref 12.0–46.0)
Lymphs Abs: 0.6 10*3/uL — ABNORMAL LOW (ref 0.7–4.0)
MCHC: 35 g/dL (ref 30.0–36.0)
MCV: 91.7 fl (ref 78.0–100.0)
Monocytes Absolute: 0.4 10*3/uL (ref 0.1–1.0)
Monocytes Relative: 4.9 % (ref 3.0–12.0)
Neutro Abs: 7.5 10*3/uL (ref 1.4–7.7)
Neutrophils Relative %: 88.5 % — ABNORMAL HIGH (ref 43.0–77.0)
Platelets: 206 10*3/uL (ref 150.0–400.0)
RBC: 3.93 Mil/uL (ref 3.87–5.11)
RDW: 15.8 % — ABNORMAL HIGH (ref 11.5–15.5)
WBC: 8.4 10*3/uL (ref 4.0–10.5)

## 2015-03-28 LAB — HEPATIC FUNCTION PANEL
ALT: 36 U/L — ABNORMAL HIGH (ref 0–35)
AST: 26 U/L (ref 0–37)
Albumin: 4.3 g/dL (ref 3.5–5.2)
Alkaline Phosphatase: 52 U/L (ref 39–117)
Bilirubin, Direct: 0.2 mg/dL (ref 0.0–0.3)
Total Bilirubin: 0.6 mg/dL (ref 0.2–1.2)
Total Protein: 7.3 g/dL (ref 6.0–8.3)

## 2015-03-28 LAB — BASIC METABOLIC PANEL
BUN: 10 mg/dL (ref 6–23)
CO2: 30 mEq/L (ref 19–32)
Calcium: 9.9 mg/dL (ref 8.4–10.5)
Chloride: 100 mEq/L (ref 96–112)
Creatinine, Ser: 0.82 mg/dL (ref 0.40–1.20)
GFR: 72.85 mL/min (ref >60.00)
Glucose, Bld: 129 mg/dL — ABNORMAL HIGH (ref 70–99)
Potassium: 4 mEq/L (ref 3.5–5.1)
Sodium: 137 mEq/L (ref 135–145)

## 2015-03-28 LAB — LIPASE: Lipase: 27 U/L (ref 11.0–59.0)

## 2015-03-28 NOTE — Patient Instructions (Signed)
  Your next office appointment will be determined based upon review of your pending labs & xrays  Those instructions will be transmitted to you by My Chart  Critical results will be called.   Followup as needed for any active or acute issue. Please report any significant change in your symptoms. 

## 2015-03-28 NOTE — Assessment & Plan Note (Signed)
CBC & dif  LFTs Lipase Consider GI referral

## 2015-03-28 NOTE — Progress Notes (Signed)
   Subjective:    Patient ID: Lisa Larson, female    DOB: 1941-03-16, 74 y.o.   MRN: [id number]  HPI  The hospital records 3/26-3/27/16 were reviewed. She presented with nausea, abdominal pain, anorexia, generalized weakness, fatigue, and chills. She was also having chest pain. Cardiac enzymes were negative. Chest x-ray did suggest possible right middle lobe infiltrate although she had no significant pulmonary symptoms beyond her baseline. Ultrasound revealed gallstones the largest of which was 5 mm. She also had fatty liver. Lipase was as high as 120 but dropped to 30. She had mild reduction in platelet count without associated bleeding.  Prior to admission she had not been drinking  because of nausea for at least 2-3 days. Since discharge she has had some alcohol intake.  Follow-up of CBC, comprehensive metabolic profile, and lipase was recommended. Consideration for referral to General Surgery for the mobile gallstones was mentioned along with possible evaluation by GI.    Review of Systems Unexplained weight loss,anorexia, nausea /vomiting, abdominal pain, significant dyspepsia, dysphagia, melena, rectal bleeding, or persistently small caliber stools are now absent.  No fever , chills , sweats reported.   There is no significant cough, sputum production, wheezing,or  paroxysmal nocturnal dyspnea.     Objective:   Physical Exam Pertinent or positive findings include: She has a distant S4.  Abdomen is protuberant. There is slight tenting of the skin.  She has crepitus of the knees.  Pedal pulses are decreased, especially dorsalis pedis pulses.   General appearance is one of adequate nourishment w/o distress. Eyes: No conjunctival inflammation or scleral icterus is present. Oral exam: Dental hygiene is good; lips and gums are healthy appearing.There is no oropharyngeal erythema or exudate noted.  Heart:  Normal rate and regular rhythm. S1 and S2 normal without gallop, murmur,  click,or rub. Lungs:Chest clear to auscultation; no wheezes, rhonchi,rales ,or rubs present.No increased work of breathing.  Abdomen: bowel sounds normal, soft and non-tender without masses, organomegaly or hernias noted.  No guarding or rebound . Musculoskeletal: Able to lie flat and sit up without help. Negative straight leg raising bilaterally. Gait normal Skin:Warm & dry.  Intact without suspicious lesions or rashes ; no jaundice or tenting Lymphatic: No lymphadenopathy is noted about the head, neck, axilla             Assessment & Plan:  See Current Assessment & Plan in Problem List under specific Diagnosis

## 2015-03-28 NOTE — Progress Notes (Signed)
Pre visit review using our clinic review tool, if applicable. No additional management support is needed unless otherwise documented below in the visit note. 

## 2015-03-28 NOTE — Assessment & Plan Note (Signed)
CXRay

## 2015-03-29 ENCOUNTER — Telehealth: Payer: Self-pay

## 2015-03-29 ENCOUNTER — Telehealth: Payer: Self-pay | Admitting: Pulmonary Disease

## 2015-03-29 ENCOUNTER — Other Ambulatory Visit (INDEPENDENT_AMBULATORY_CARE_PROVIDER_SITE_OTHER): Payer: Medicare Other

## 2015-03-29 ENCOUNTER — Other Ambulatory Visit: Payer: Self-pay | Admitting: Internal Medicine

## 2015-03-29 DIAGNOSIS — R7309 Other abnormal glucose: Secondary | ICD-10-CM

## 2015-03-29 DIAGNOSIS — K8000 Calculus of gallbladder with acute cholecystitis without obstruction: Secondary | ICD-10-CM

## 2015-03-29 DIAGNOSIS — R748 Abnormal levels of other serum enzymes: Secondary | ICD-10-CM

## 2015-03-29 DIAGNOSIS — K8020 Calculus of gallbladder without cholecystitis without obstruction: Secondary | ICD-10-CM | POA: Insufficient documentation

## 2015-03-29 DIAGNOSIS — R739 Hyperglycemia, unspecified: Secondary | ICD-10-CM

## 2015-03-29 LAB — HEMOGLOBIN A1C: Hgb A1c MFr Bld: 5.6 % (ref 4.6–6.5)

## 2015-03-29 NOTE — Telephone Encounter (Signed)
Patient was in hospital on 03/24/15.  She had RML infiltrate noted on CXR.  CBC with diff did not show elevation in eosinophils.  She was not started on Abx since clinical suspicion for PNA was low.  ?if could be atelectasis in setting of asthma.  Attempted to call pt to discuss in more detail >> no answer.  Will have my nurse schedule ROV with either me or Lisa Larson to further assess.

## 2015-03-29 NOTE — Telephone Encounter (Signed)
Dr. Halford Chessman please advise if you are ok with waiting to see pt on 5/17 at next available appt or is it ok to double book her?  If so, please advise if you have a particular day you'd prefer.  Thanks!

## 2015-03-29 NOTE — Telephone Encounter (Signed)
lmtcb x1 for pt. 

## 2015-03-29 NOTE — Telephone Encounter (Signed)
Spoke with patient - states that she has company over which is why she did not answer her phone. Refused to schedule appt at this time. States that she will call back when she wants to schedule. States that she has seen all the results on MyChart already.  Will send to Dr Gwenette Greet as Juluis Rainier.

## 2015-03-29 NOTE — Telephone Encounter (Signed)
Noted.  Please inform Dr. [name] about this also.

## 2015-03-29 NOTE — Telephone Encounter (Signed)
Request for add on has been faxed to lab 

## 2015-03-29 NOTE — Telephone Encounter (Signed)
-----   Message from Hendricks Limes, MD sent at 03/29/2015  6:36 AM EDT ----- Please add A1c (R73.9)

## 2015-03-29 NOTE — Telephone Encounter (Signed)
Pt returned call.  Pt only wants to see VS. He has nothing available until 5/17  [phone]

## 2015-03-29 NOTE — Telephone Encounter (Signed)
Can double book visit.

## 2015-03-29 NOTE — Telephone Encounter (Signed)
Dr. [name] please see below messages pertaining to mutual patient. Thanks.

## 2015-03-30 NOTE — Telephone Encounter (Signed)
Pt returning call.Lisa Larson ° °

## 2015-03-30 NOTE — Telephone Encounter (Signed)
Pt called back  Lisa Larson will make appt on a day he is not already overbooked

## 2015-04-04 ENCOUNTER — Other Ambulatory Visit: Payer: Self-pay | Admitting: Internal Medicine

## 2015-04-05 DIAGNOSIS — M170 Bilateral primary osteoarthritis of knee: Secondary | ICD-10-CM | POA: Diagnosis not present

## 2015-04-05 DIAGNOSIS — M4726 Other spondylosis with radiculopathy, lumbar region: Secondary | ICD-10-CM | POA: Diagnosis not present

## 2015-04-05 DIAGNOSIS — M25561 Pain in right knee: Secondary | ICD-10-CM | POA: Diagnosis not present

## 2015-04-13 DIAGNOSIS — M1711 Unilateral primary osteoarthritis, right knee: Secondary | ICD-10-CM | POA: Diagnosis not present

## 2015-04-13 DIAGNOSIS — M1712 Unilateral primary osteoarthritis, left knee: Secondary | ICD-10-CM | POA: Diagnosis not present

## 2015-04-13 DIAGNOSIS — M25562 Pain in left knee: Secondary | ICD-10-CM | POA: Diagnosis not present

## 2015-04-13 DIAGNOSIS — M25561 Pain in right knee: Secondary | ICD-10-CM | POA: Diagnosis not present

## 2015-04-16 ENCOUNTER — Other Ambulatory Visit: Payer: Self-pay | Admitting: Internal Medicine

## 2015-04-16 NOTE — Telephone Encounter (Signed)
If nausea is persistent or recurring; GI referral indicated . Rather than simply treating a symptom the cause should be determined

## 2015-04-17 ENCOUNTER — Encounter: Payer: Self-pay | Admitting: Physician Assistant

## 2015-04-17 ENCOUNTER — Other Ambulatory Visit (INDEPENDENT_AMBULATORY_CARE_PROVIDER_SITE_OTHER): Payer: Medicare Other

## 2015-04-17 ENCOUNTER — Ambulatory Visit (INDEPENDENT_AMBULATORY_CARE_PROVIDER_SITE_OTHER): Payer: Medicare Other | Admitting: Physician Assistant

## 2015-04-17 VITALS — BP 122/70 | HR 84 | Ht 60.0 in | Wt 162.5 lb

## 2015-04-17 DIAGNOSIS — R079 Chest pain, unspecified: Secondary | ICD-10-CM

## 2015-04-17 DIAGNOSIS — K760 Fatty (change of) liver, not elsewhere classified: Secondary | ICD-10-CM | POA: Diagnosis not present

## 2015-04-17 DIAGNOSIS — R1013 Epigastric pain: Secondary | ICD-10-CM

## 2015-04-17 DIAGNOSIS — K8020 Calculus of gallbladder without cholecystitis without obstruction: Secondary | ICD-10-CM

## 2015-04-17 LAB — COMPREHENSIVE METABOLIC PANEL
ALT: 30 U/L (ref 0–35)
AST: 25 U/L (ref 0–37)
Albumin: 4.2 g/dL (ref 3.5–5.2)
Alkaline Phosphatase: 51 U/L (ref 39–117)
BUN: 17 mg/dL (ref 6–23)
CO2: 31 mEq/L (ref 19–32)
Calcium: 9.6 mg/dL (ref 8.4–10.5)
Chloride: 101 mEq/L (ref 96–112)
Creatinine, Ser: 0.94 mg/dL (ref 0.40–1.20)
GFR: 62.22 mL/min (ref >60.00)
Glucose, Bld: 97 mg/dL (ref 70–99)
Potassium: 4 mEq/L (ref 3.5–5.1)
Sodium: 138 mEq/L (ref 135–145)
Total Bilirubin: 0.9 mg/dL (ref 0.2–1.2)
Total Protein: 6.8 g/dL (ref 6.0–8.3)

## 2015-04-17 LAB — LIPASE: Lipase: 26 U/L (ref 11.0–59.0)
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
## 2015-04-17 NOTE — Progress Notes (Signed)
i agree with the above note, plan 

## 2015-04-17 NOTE — Patient Instructions (Addendum)
Please go to the basement level to have your labs drawn.  We made you an appointment at [facility] for 05-09-2015 at 10:30 am.  Please arrive at 10:00 am . You will see Dr. Greer Pickerel.

## 2015-04-18 ENCOUNTER — Other Ambulatory Visit: Payer: Self-pay | Admitting: Orthopedic Surgery

## 2015-04-18 DIAGNOSIS — M25561 Pain in right knee: Secondary | ICD-10-CM

## 2015-04-24 ENCOUNTER — Encounter: Payer: Self-pay | Admitting: Pulmonary Disease

## 2015-04-24 ENCOUNTER — Ambulatory Visit (INDEPENDENT_AMBULATORY_CARE_PROVIDER_SITE_OTHER): Payer: Medicare Other | Admitting: Pulmonary Disease

## 2015-04-24 ENCOUNTER — Ambulatory Visit (INDEPENDENT_AMBULATORY_CARE_PROVIDER_SITE_OTHER)
Admission: RE | Admit: 2015-04-24 | Discharge: 2015-04-24 | Disposition: A | Discharge status: Home / Self Care | Payer: Medicare Other | Source: Ambulatory Visit | Attending: Pulmonary Disease | Admitting: Pulmonary Disease

## 2015-04-24 VITALS — BP 122/80 | HR 80 | Ht 60.0 in | Wt 164.0 lb

## 2015-04-24 DIAGNOSIS — J82 Pulmonary eosinophilia, not elsewhere classified: Secondary | ICD-10-CM

## 2015-04-24 DIAGNOSIS — J449 Chronic obstructive pulmonary disease, unspecified: Secondary | ICD-10-CM | POA: Diagnosis not present

## 2015-04-24 DIAGNOSIS — R0602 Shortness of breath: Secondary | ICD-10-CM | POA: Diagnosis not present

## 2015-04-24 DIAGNOSIS — J8281 Chronic eosinophilic pneumonia: Secondary | ICD-10-CM

## 2015-04-24 DIAGNOSIS — J45998 Other asthma: Secondary | ICD-10-CM | POA: Diagnosis not present

## 2015-04-24 MED ORDER — ONDANSETRON HCL 4 MG PO TABS: 4.0000 mg | ORAL_TABLET | Freq: Three times a day (TID) | ORAL | Status: DC | PRN

## 2015-04-24 NOTE — Progress Notes (Signed)
Chief Complaint  Patient presents with  . Follow-up    Pt states breathing has improved. Denies any SOB, chest congestion/tightness, wheezing, cough. Pt no longer taking dulera.     History of Present Illness: Lisa Larson is a 74 y.o. 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  She was in hospital with gallbladder disease in March.  She had CXR which showed infiltrate in RML.    She feels her breathing is the only thing that isn't causing her problems.  She denies cough, wheeze, sputum, chest pain, fever, or hemoptysis.  She ran out of dulera, and has not used for several weeks.  She remains on prednisone 10 mg daily.  She feels flonase helps a lot with her sinuses.  She is not using albuterol much.  CBC from 03/28/15 showed absolute eosinophil count of 0.  Tests: CBC 01/14/11>>39% eosinophils 01/14/11>> HIV negative, ACE 39, RF negative, ANA 1:40, ANCA negative  CT chest 01/16/11>>Multifocal b/l ASD with GGO BAL 01/21/11>>48% Eosinophils  IgE 02/25/11>>282 CT chest 07/22/11>>resolution of ASD CT chest 10/03/11>>recurrence of nodular infiltrate Rt upper lobe PFT 11/04/12>>FEV1 1.77 (87%), FEV1% 68, TLC 4.87 (101%), DLCO 69%, +BD from FEF 25-75%.  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  Physical Exam: Blood pressure 122/80, pulse 80, height 5' (1.524 m), weight 164 lb (74.39 kg), SpO2 96 %. Body mass index is 32.03 kg/(m^2).  General - obese HEENT - no sinus tenderness, no oral exudate, no LAN, soft collar in place Chest - no wheeze/rales/dullness Cardiac - s1s2 regular, no murmur Abd - soft, nontender Ext - no edema Neuro - normal strength Skin - no rash Psych - normal mood, behavior   Assessment/Plan:  Eosinophilic pneumonia. She was noted to have faint infiltrate in RML on CXR in March 2016. Plan: - continue prednisone 10 mg daily - repeat CXR  today  Asthma. Plan: - continue prn proair - defer restarting dulera for now  Allergic rhinitis. Plan: - continue flonase   [name], MD [place] 04/24/2015, 2:10 PM Pager:  [phone] After 3pm call: [phone]

## 2015-04-24 NOTE — Patient Instructions (Signed)
Chest xray today  Follow up in 4 months 

## 2015-04-26 ENCOUNTER — Encounter: Payer: Self-pay | Admitting: Pulmonary Disease

## 2015-04-27 ENCOUNTER — Telehealth: Payer: Self-pay | Admitting: Pulmonary Disease

## 2015-04-27 NOTE — Telephone Encounter (Signed)
VS - please advise on pt's CXR results. Thanks.

## 2015-04-27 NOTE — Telephone Encounter (Signed)
Pt calling about her xray results   [phone]

## 2015-04-27 NOTE — Telephone Encounter (Signed)
Dg Chest 2 View  04/24/2015   CLINICAL DATA:  Shortness of breath. History of COPD. History of pneumonia.  EXAM: CHEST  2 VIEW  COMPARISON:  PA and lateral chest 03/24/2015, 03/28/2015, 10/02/2014 and single view of the chest 09/13/2013. CT chest 07/10/2011.  FINDINGS: Chronic linear opacity in the right middle lobe is identified and likely due to scar. It is present on the 2012 CT scan. The lungs are otherwise clear. Heart size is upper normal. No pneumothorax or pleural effusion.  IMPRESSION: No acute abnormality.  Chronic right middle lobe scarring.   Electronically Signed   By: Inge Rise M.D.   On: 04/24/2015 15:38    Will have my nurse inform pt that chest xray shows chronic scar in middle of right lung from previous episodes of pneumonia.  No evidence for active pneumonia at this time.  No change to current tx plan.

## 2015-04-27 NOTE — Telephone Encounter (Signed)
Patient notified.  No questions or concerns at this time. Nothing further needed.   

## 2015-04-29 ENCOUNTER — Ambulatory Visit
Admission: RE | Admit: 2015-04-29 | Discharge: 2015-04-29 | Disposition: A | Discharge status: Home / Self Care | Payer: Medicare Other | Source: Ambulatory Visit | Attending: Orthopedic Surgery | Admitting: Orthopedic Surgery

## 2015-04-29 DIAGNOSIS — M25561 Pain in right knee: Secondary | ICD-10-CM

## 2015-04-29 DIAGNOSIS — M23251 Derangement of posterior horn of lateral meniscus due to old tear or injury, right knee: Secondary | ICD-10-CM | POA: Diagnosis not present

## 2015-04-29 DIAGNOSIS — M1711 Unilateral primary osteoarthritis, right knee: Secondary | ICD-10-CM | POA: Diagnosis not present

## 2015-05-01 ENCOUNTER — Other Ambulatory Visit: Payer: Self-pay | Admitting: Internal Medicine

## 2015-05-02 ENCOUNTER — Encounter: Payer: Self-pay | Admitting: Pulmonary Disease

## 2015-05-02 ENCOUNTER — Encounter: Payer: Self-pay | Admitting: Internal Medicine

## 2015-05-02 ENCOUNTER — Other Ambulatory Visit: Payer: Self-pay | Admitting: Internal Medicine

## 2015-05-02 NOTE — Telephone Encounter (Signed)
5.4.16 mychart email from pt: Message     Could you please send me an email or preferably slow mail the results of my last lung x ray. it's important to me since I've had this pneumonia since Feb. of 2112 and it's now May 2016.    Thanking you in advance.    Lisa Larson    [phone].    Per VS' documentation in the 4.29.16 phone note: Will have my nurse inform pt that chest xray shows chronic scar in middle of right lung from previous episodes of pneumonia. No evidence for active pneumonia at this time. No change to current tx plan.  Email sent to patient with the above cxr results as stated by VS. Also offered to mail results to patient as well if she would like Will go ahead and sign off on email

## 2015-05-02 NOTE — Telephone Encounter (Signed)
Lorazepam has been called to [facility]

## 2015-05-02 NOTE — Telephone Encounter (Signed)
This medication is among those which experts have documented to have a very  high risk of affecting  mental  alertness  & balance. This results in increased risk of falling with serious health or life threatening injury. Such medication should be taken as infrequently as possible and @  the lowest possible dose.It should not be taken with alcohol, sedatives  or other agents which have a similar  adverse risk potential. These risks are greater as we age as there is decreased ability of the liver and kidneys to metabolize and excrete the medication, resulting in   increased blood levels of the active ingredient.  #30 1/2 prn only, NOT routinely

## 2015-05-03 DIAGNOSIS — M1711 Unilateral primary osteoarthritis, right knee: Secondary | ICD-10-CM | POA: Diagnosis not present

## 2015-05-03 DIAGNOSIS — M25561 Pain in right knee: Secondary | ICD-10-CM | POA: Diagnosis not present

## 2015-05-10 DIAGNOSIS — Z6831 Body mass index (BMI) 31.0-31.9, adult: Secondary | ICD-10-CM | POA: Diagnosis not present

## 2015-05-10 DIAGNOSIS — S12112A Nondisplaced Type II dens fracture, initial encounter for closed fracture: Secondary | ICD-10-CM | POA: Diagnosis not present

## 2015-05-31 ENCOUNTER — Other Ambulatory Visit: Payer: Self-pay | Admitting: Internal Medicine

## 2015-06-02 ENCOUNTER — Encounter: Payer: Self-pay | Admitting: Internal Medicine

## 2015-06-04 ENCOUNTER — Other Ambulatory Visit: Payer: Self-pay | Admitting: Geriatric Medicine

## 2015-06-04 MED ORDER — GABAPENTIN 100 MG PO CAPS: ORAL_CAPSULE | ORAL | Status: DC

## 2015-06-06 ENCOUNTER — Other Ambulatory Visit (HOSPITAL_COMMUNITY): Payer: Self-pay | Admitting: Orthopedic Surgery

## 2015-06-06 DIAGNOSIS — M1711 Unilateral primary osteoarthritis, right knee: Secondary | ICD-10-CM | POA: Diagnosis not present

## 2015-06-14 ENCOUNTER — Encounter (HOSPITAL_COMMUNITY): Payer: Self-pay

## 2015-06-14 ENCOUNTER — Encounter (HOSPITAL_COMMUNITY)
Admission: RE | Admit: 2015-06-14 | Discharge: 2015-06-14 | Disposition: A | Discharge status: Home / Self Care | Payer: Medicare Other | Source: Ambulatory Visit | Attending: Orthopedic Surgery | Admitting: Orthopedic Surgery

## 2015-06-14 DIAGNOSIS — Z01818 Encounter for other preprocedural examination: Secondary | ICD-10-CM | POA: Diagnosis not present

## 2015-06-14 HISTORY — DX: Personal history of other diseases of the musculoskeletal system and connective tissue: Z87.39

## 2015-06-14 HISTORY — DX: Dizziness and giddiness: R42

## 2015-06-14 HISTORY — DX: Personal history of other diseases of the respiratory system: Z87.09

## 2015-06-14 HISTORY — DX: Pain in unspecified joint: M25.50

## 2015-06-14 HISTORY — DX: Weakness: R53.1

## 2015-06-14 HISTORY — DX: Cardiac murmur, unspecified: R01.1

## 2015-06-14 LAB — BASIC METABOLIC PANEL
Anion gap: 10 (ref 5–15)
BUN: 13 mg/dL (ref 6–20)
CO2: 25 mmol/L (ref 22–32)
Calcium: 9.5 mg/dL (ref 8.9–10.3)
Chloride: 104 mmol/L (ref 101–111)
Creatinine, Ser: 0.94 mg/dL (ref 0.44–1.00)
GFR calc Af Amer: >60 mL/min (ref >60)
GFR calc non Af Amer: 59 mL/min — ABNORMAL LOW (ref >60)
Glucose, Bld: 174 mg/dL — ABNORMAL HIGH (ref 65–99)
Potassium: 4 mmol/L (ref 3.5–5.1)
Sodium: 139 mmol/L (ref 135–145)

## 2015-06-14 LAB — CBC
HCT: 38.1 % (ref 36.0–46.0)
Hemoglobin: 12.6 g/dL (ref 12.0–15.0)
MCH: 33.4 pg (ref 26.0–34.0)
MCHC: 33.1 g/dL (ref 30.0–36.0)
MCV: 101.1 fL — ABNORMAL HIGH (ref 78.0–100.0)
Platelets: 174 10*3/uL (ref 150–400)
RBC: 3.77 MIL/uL — ABNORMAL LOW (ref 3.87–5.11)
RDW: 14.3 % (ref 11.5–15.5)
WBC: 9.1 10*3/uL (ref 4.0–10.5)

## 2015-06-14 MED ORDER — CHLORHEXIDINE GLUCONATE 4 % EX LIQD: 60.0000 mL | Freq: Once | CUTANEOUS | Status: DC

## 2015-06-14 NOTE — Progress Notes (Addendum)
Saw a Cardiologist 8+yrs ago and sent by Dr.Hopper d/t chest pain but ruled anxiety  Echo report in epic from 2009  Stress test denies   Heart cath denies  Medical Md is Dr.William Hopper  EKG in epic from 03-24-15  CXR in epic from 04-24-15

## 2015-06-14 NOTE — Pre-Procedure Instructions (Signed)
Tiwanna Tuch  06/14/2015      [id number] - [place], [place] - [address] [place] [address] Phone: [phone] Fax: [phone]    Your procedure is scheduled on Tues, June 20 @ 7:30 AM  Report to [facility] Admitting at  5:30 AM.  Call this number if you have problems the morning of surgery:  [phone]   Remember:  Do not eat food or drink liquids after midnight.  Take these medicines the morning of surgery with A SIP OF WATER:Albuterol<Bring Your Inhaler With You>,Cetirizine,Cymbalta(Duloxetine),Flonase(Fluticasone),Gabapentin(Neurontin),Pain Pill(if needed),Ativan(Lorazepam),Omeprazole(Prilosec)              Stop taking your Aspirin,Vitamins,and any Herbal Medications. No Goody's,BC's,Aleve,Ibuprofen,or Fish Oil.   Do not wear jewelry, make-up or nail polish.  Do not wear lotions, powders, or perfumes.  You may wear deodorant.  Do not shave 48 hours prior to surgery.    Do not bring valuables to the hospital.  [facility] is not responsible for any belongings or valuables.  Contacts, dentures or bridgework may not be worn into surgery.  Leave your suitcase in the car.  After surgery it may be brought to your room.  For patients admitted to the hospital, discharge time will be determined by your treatment team.  Patients discharged the day of surgery will not be allowed to drive home.    Special instructions:  [facility] - Preparing for Surgery  Before surgery, you can play an important role.  Because skin is not sterile, your skin needs to be as free of germs as possible.  You can reduce the number of germs on you skin by washing with CHG (chlorahexidine gluconate) soap before surgery.  CHG is an antiseptic cleaner which kills germs and bonds with the skin to continue killing germs even after washing.  Please DO NOT use if you have an allergy to CHG or  antibacterial soaps.  If your skin becomes reddened/irritated stop using the CHG and inform your nurse when you arrive at Short Stay.  Do not shave (including legs and underarms) for at least 48 hours prior to the first CHG shower.  You may shave your face.  Please follow these instructions carefully:   1.  Shower with CHG Soap the night before surgery and the                                morning of Surgery.  2.  If you choose to wash your hair, wash your hair first as usual with your       normal shampoo.  3.  After you shampoo, rinse your hair and body thoroughly to remove the                      Shampoo.  4.  Use CHG as you would any other liquid soap.  You can apply chg directly       to the skin and wash gently with scrungie or a clean washcloth.  5.  Apply the CHG Soap to your body ONLY FROM THE NECK DOWN.        Do not use on open wounds or open sores.  Avoid contact with your eyes,       ears, mouth and genitals (private parts).  Wash genitals (private parts)  with your normal soap.  6.  Wash thoroughly, paying special attention to the area where your surgery        will be performed.  7.  Thoroughly rinse your body with warm water from the neck down.  8.  DO NOT shower/wash with your normal soap after using and rinsing off       the CHG Soap.  9.  Pat yourself dry with a clean towel.            10.  Wear clean pajamas.            11.  Place clean sheets on your bed the night of your first shower and do not        sleep with pets.  Day of Surgery  Do not apply any lotions/deoderants the morning of surgery.  Please wear clean clothes to the hospital/surgery center.    Please read over the following fact sheets that you were given. Pain Booklet, Coughing and Deep Breathing and Surgical Site Infection Prevention
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
## 2015-06-18 ENCOUNTER — Ambulatory Visit (HOSPITAL_COMMUNITY): Payer: Medicare Other | Admitting: Anesthesiology

## 2015-06-18 ENCOUNTER — Encounter (HOSPITAL_COMMUNITY): Payer: Self-pay | Admitting: *Deleted

## 2015-06-18 ENCOUNTER — Encounter (HOSPITAL_COMMUNITY)
Admission: RE | Disposition: A | Discharge status: Home / Self Care | Payer: Self-pay | Source: Ambulatory Visit | Attending: Orthopedic Surgery

## 2015-06-18 ENCOUNTER — Ambulatory Visit (HOSPITAL_COMMUNITY)
Admission: RE | Admit: 2015-06-18 | Discharge: 2015-06-18 | Disposition: A | Discharge status: Home / Self Care | Payer: Medicare Other | Source: Ambulatory Visit | Attending: Orthopedic Surgery | Admitting: Orthopedic Surgery

## 2015-06-18 DIAGNOSIS — Z885 Allergy status to narcotic agent status: Secondary | ICD-10-CM | POA: Diagnosis not present

## 2015-06-18 DIAGNOSIS — E669 Obesity, unspecified: Secondary | ICD-10-CM | POA: Insufficient documentation

## 2015-06-18 DIAGNOSIS — Z87891 Personal history of nicotine dependence: Secondary | ICD-10-CM | POA: Insufficient documentation

## 2015-06-18 DIAGNOSIS — Z6832 Body mass index (BMI) 32.0-32.9, adult: Secondary | ICD-10-CM | POA: Insufficient documentation

## 2015-06-18 DIAGNOSIS — M1990 Unspecified osteoarthritis, unspecified site: Secondary | ICD-10-CM | POA: Diagnosis not present

## 2015-06-18 DIAGNOSIS — J45909 Unspecified asthma, uncomplicated: Secondary | ICD-10-CM | POA: Diagnosis not present

## 2015-06-18 DIAGNOSIS — K219 Gastro-esophageal reflux disease without esophagitis: Secondary | ICD-10-CM | POA: Diagnosis not present

## 2015-06-18 DIAGNOSIS — Z882 Allergy status to sulfonamides status: Secondary | ICD-10-CM | POA: Diagnosis not present

## 2015-06-18 DIAGNOSIS — S83281D Other tear of lateral meniscus, current injury, right knee, subsequent encounter: Secondary | ICD-10-CM | POA: Diagnosis not present

## 2015-06-18 DIAGNOSIS — Z888 Allergy status to other drugs, medicaments and biological substances status: Secondary | ICD-10-CM | POA: Insufficient documentation

## 2015-06-18 DIAGNOSIS — R011 Cardiac murmur, unspecified: Secondary | ICD-10-CM | POA: Diagnosis not present

## 2015-06-18 DIAGNOSIS — M8580 Other specified disorders of bone density and structure, unspecified site: Secondary | ICD-10-CM | POA: Diagnosis not present

## 2015-06-18 DIAGNOSIS — X58XXXA Exposure to other specified factors, initial encounter: Secondary | ICD-10-CM | POA: Diagnosis not present

## 2015-06-18 DIAGNOSIS — Y998 Other external cause status: Secondary | ICD-10-CM | POA: Insufficient documentation

## 2015-06-18 DIAGNOSIS — S83241A Other tear of medial meniscus, current injury, right knee, initial encounter: Secondary | ICD-10-CM | POA: Diagnosis not present

## 2015-06-18 DIAGNOSIS — J449 Chronic obstructive pulmonary disease, unspecified: Secondary | ICD-10-CM | POA: Diagnosis not present

## 2015-06-18 DIAGNOSIS — M94261 Chondromalacia, right knee: Secondary | ICD-10-CM | POA: Insufficient documentation

## 2015-06-18 DIAGNOSIS — I10 Essential (primary) hypertension: Secondary | ICD-10-CM | POA: Insufficient documentation

## 2015-06-18 DIAGNOSIS — M109 Gout, unspecified: Secondary | ICD-10-CM | POA: Insufficient documentation

## 2015-06-18 DIAGNOSIS — M67461 Ganglion, right knee: Secondary | ICD-10-CM | POA: Insufficient documentation

## 2015-06-18 DIAGNOSIS — M5136 Other intervertebral disc degeneration, lumbar region: Secondary | ICD-10-CM | POA: Insufficient documentation

## 2015-06-18 DIAGNOSIS — M25861 Other specified joint disorders, right knee: Secondary | ICD-10-CM | POA: Diagnosis not present

## 2015-06-18 DIAGNOSIS — M6740 Ganglion, unspecified site: Secondary | ICD-10-CM | POA: Diagnosis not present

## 2015-06-18 DIAGNOSIS — Y9289 Other specified places as the place of occurrence of the external cause: Secondary | ICD-10-CM | POA: Diagnosis not present

## 2015-06-18 DIAGNOSIS — F419 Anxiety disorder, unspecified: Secondary | ICD-10-CM | POA: Insufficient documentation

## 2015-06-18 DIAGNOSIS — Y9389 Activity, other specified: Secondary | ICD-10-CM | POA: Insufficient documentation

## 2015-06-18 DIAGNOSIS — S83281A Other tear of lateral meniscus, current injury, right knee, initial encounter: Secondary | ICD-10-CM | POA: Diagnosis not present

## 2015-06-18 DIAGNOSIS — F329 Major depressive disorder, single episode, unspecified: Secondary | ICD-10-CM | POA: Insufficient documentation

## 2015-06-18 HISTORY — PX: KNEE ARTHROSCOPY: SHX127

## 2015-06-18 SURGERY — ARTHROSCOPY, KNEE
Anesthesia: General | Site: Knee | Laterality: Right

## 2015-06-18 MED ORDER — MORPHINE SULFATE 4 MG/ML IJ SOLN
INTRAMUSCULAR | Status: AC
  Filled 2015-06-18: qty 2

## 2015-06-18 MED ORDER — LIDOCAINE HCL (CARDIAC) 20 MG/ML IV SOLN
INTRAVENOUS | Status: DC | PRN
  Administered 2015-06-18: 100 mg via INTRAVENOUS

## 2015-06-18 MED ORDER — ONDANSETRON HCL 4 MG/2ML IJ SOLN
INTRAMUSCULAR | Status: AC
  Filled 2015-06-18: qty 2

## 2015-06-18 MED ORDER — BUPIVACAINE HCL (PF) 0.25 % IJ SOLN
INTRAMUSCULAR | Status: DC | PRN
  Administered 2015-06-18: 10 mL

## 2015-06-18 MED ORDER — CLONIDINE HCL (ANALGESIA) 100 MCG/ML EP SOLN
EPIDURAL | Status: DC | PRN
  Administered 2015-06-18: 1 mL

## 2015-06-18 MED ORDER — OXYCODONE HCL 5 MG PO TABS: 5.0000 mg | ORAL_TABLET | Freq: Once | ORAL | Status: DC | PRN

## 2015-06-18 MED ORDER — FENTANYL CITRATE (PF) 100 MCG/2ML IJ SOLN
INTRAMUSCULAR | Status: DC | PRN
Start: 2015-06-18 — End: 2015-06-18
  Administered 2015-06-18: 50 ug via INTRAVENOUS

## 2015-06-18 MED ORDER — LACTATED RINGERS IV SOLN
INTRAVENOUS | Status: DC | PRN
  Administered 2015-06-18: 07:00:00 via INTRAVENOUS

## 2015-06-18 MED ORDER — MIDAZOLAM HCL 2 MG/2ML IJ SOLN
INTRAMUSCULAR | Status: AC
  Filled 2015-06-18: qty 2

## 2015-06-18 MED ORDER — PROPOFOL 10 MG/ML IV BOLUS
INTRAVENOUS | Status: DC | PRN
  Administered 2015-06-18: 150 mg via INTRAVENOUS

## 2015-06-18 MED ORDER — PROMETHAZINE HCL 25 MG/ML IJ SOLN: 6.2500 mg | INTRAMUSCULAR | Status: DC | PRN

## 2015-06-18 MED ORDER — CEFAZOLIN SODIUM-DEXTROSE 2-3 GM-% IV SOLR
2.0000 g | INTRAVENOUS | Status: AC
  Administered 2015-06-18: 2 g via INTRAVENOUS

## 2015-06-18 MED ORDER — OXYCODONE HCL 5 MG/5ML PO SOLN: 5.0000 mg | Freq: Once | ORAL | Status: DC | PRN

## 2015-06-18 MED ORDER — MIDAZOLAM HCL 5 MG/5ML IJ SOLN
INTRAMUSCULAR | Status: DC | PRN
  Administered 2015-06-18: 2 mg via INTRAVENOUS

## 2015-06-18 MED ORDER — ONDANSETRON HCL 4 MG/2ML IJ SOLN
INTRAMUSCULAR | Status: DC | PRN
  Administered 2015-06-18: 4 mg via INTRAVENOUS

## 2015-06-18 MED ORDER — HYDROMORPHONE HCL 1 MG/ML IJ SOLN: 0.2500 mg | INTRAMUSCULAR | Status: DC | PRN

## 2015-06-18 MED ORDER — LIDOCAINE HCL (CARDIAC) 20 MG/ML IV SOLN
INTRAVENOUS | Status: AC
  Filled 2015-06-18: qty 5

## 2015-06-18 MED ORDER — FENTANYL CITRATE (PF) 250 MCG/5ML IJ SOLN
INTRAMUSCULAR | Status: AC
  Filled 2015-06-18: qty 5

## 2015-06-18 MED ORDER — PROPOFOL 10 MG/ML IV BOLUS
INTRAVENOUS | Status: AC
  Filled 2015-06-18: qty 20

## 2015-06-18 MED ORDER — ROCURONIUM BROMIDE 50 MG/5ML IV SOLN
INTRAVENOUS | Status: AC
  Filled 2015-06-18: qty 1

## 2015-06-18 MED ORDER — MEPERIDINE HCL 25 MG/ML IJ SOLN: 6.2500 mg | INTRAMUSCULAR | Status: DC | PRN

## 2015-06-18 MED ORDER — CLONIDINE HCL (ANALGESIA) 100 MCG/ML EP SOLN
150.0000 ug | Freq: Once | EPIDURAL | Status: DC
  Filled 2015-06-18: qty 1.5

## 2015-06-18 MED ORDER — MORPHINE SULFATE 4 MG/ML IJ SOLN
INTRAMUSCULAR | Status: DC | PRN
  Administered 2015-06-18: 8 mg via INTRAVENOUS

## 2015-06-18 MED ORDER — BUPIVACAINE HCL (PF) 0.25 % IJ SOLN
INTRAMUSCULAR | Status: AC
  Filled 2015-06-18: qty 30

## 2015-06-18 MED ORDER — SODIUM CHLORIDE 0.9 % IR SOLN
Status: DC | PRN
  Administered 2015-06-18 (×2): 3000 mL

## 2015-06-18 MED ORDER — LIDOCAINE HCL 1 % IJ SOLN
INTRAMUSCULAR | Status: DC | PRN
  Administered 2015-06-18: 10 mL via INTRAMUSCULAR

## 2015-06-18 SURGICAL SUPPLY — 57 items
BANDAGE ELASTIC 6 VELCRO ST LF (GAUZE/BANDAGES/DRESSINGS) ×2 IMPLANT
BANDAGE ESMARK 6X9 LF (GAUZE/BANDAGES/DRESSINGS) IMPLANT
BLADE CUDA 5.5 (BLADE) IMPLANT
BLADE GREAT WHITE 4.2 (BLADE) ×1 IMPLANT
BLADE GREAT WHITE 4.2MM (BLADE) ×1
BLADE SURG ROTATE 9660 (MISCELLANEOUS) IMPLANT
BNDG CMPR 9X6 STRL LF SNTH (GAUZE/BANDAGES/DRESSINGS)
BNDG ESMARK 6X9 LF (GAUZE/BANDAGES/DRESSINGS)
BUR OVAL 6.0 (BURR) IMPLANT
COVER SURGICAL LIGHT HANDLE (MISCELLANEOUS) ×3 IMPLANT
CUFF TOURNIQUET SINGLE 34IN LL (TOURNIQUET CUFF) IMPLANT
CUFF TOURNIQUET SINGLE 44IN (TOURNIQUET CUFF) IMPLANT
DRAPE ARTHROSCOPY W/POUCH 114 (DRAPES) ×3 IMPLANT
DRAPE INCISE IOBAN 66X45 STRL (DRAPES) IMPLANT
DRAPE PROXIMA HALF (DRAPES) IMPLANT
DRAPE U-SHAPE 47X51 STRL (DRAPES) ×3 IMPLANT
DURAPREP 26ML APPLICATOR (WOUND CARE) ×5 IMPLANT
GAUZE SPONGE 4X4 12PLY STRL (GAUZE/BANDAGES/DRESSINGS) ×2 IMPLANT
GAUZE XEROFORM 1X8 LF (GAUZE/BANDAGES/DRESSINGS) ×2 IMPLANT
GLOVE BIO SURGEON STRL SZ7 (GLOVE) ×2 IMPLANT
GLOVE BIOGEL M 7.0 STRL (GLOVE) ×2 IMPLANT
GLOVE BIOGEL PI IND STRL 6.5 (GLOVE) IMPLANT
GLOVE BIOGEL PI IND STRL 8 (GLOVE) ×1 IMPLANT
GLOVE BIOGEL PI INDICATOR 6.5 (GLOVE) ×2
GLOVE BIOGEL PI INDICATOR 8 (GLOVE) ×2
GLOVE SURG ORTHO 8.0 STRL STRW (GLOVE) ×3 IMPLANT
GLOVE SURG SS PI 6.5 STRL IVOR (GLOVE) ×2 IMPLANT
GLOVE SURG SS PI 7.0 STRL IVOR (GLOVE) ×2 IMPLANT
GOWN STRL REUS W/ TWL LRG LVL3 (GOWN DISPOSABLE) ×2 IMPLANT
GOWN STRL REUS W/ TWL XL LVL3 (GOWN DISPOSABLE) ×1 IMPLANT
GOWN STRL REUS W/TWL LRG LVL3 (GOWN DISPOSABLE) ×9
GOWN STRL REUS W/TWL XL LVL3 (GOWN DISPOSABLE) ×3
KIT BASIN OR (CUSTOM PROCEDURE TRAY) ×3 IMPLANT
KIT ROOM TURNOVER OR (KITS) ×3 IMPLANT
MANIFOLD NEPTUNE II (INSTRUMENTS) ×2 IMPLANT
NDL 18GX1X1/2 (RX/OR ONLY) (NEEDLE) IMPLANT
NDL HYPO 25GX1X1/2 BEV (NEEDLE) ×1 IMPLANT
NEEDLE 18GX1X1/2 (RX/OR ONLY) (NEEDLE) IMPLANT
NEEDLE HYPO 25GX1X1/2 BEV (NEEDLE) ×3 IMPLANT
NS IRRIG 1000ML POUR BTL (IV SOLUTION) IMPLANT
PACK ARTHROSCOPY DSU (CUSTOM PROCEDURE TRAY) ×3 IMPLANT
PAD ARMBOARD 7.5X6 YLW CONV (MISCELLANEOUS) ×6 IMPLANT
PADDING CAST COTTON 6X4 STRL (CAST SUPPLIES) ×3 IMPLANT
SET ARTHROSCOPY TUBING (MISCELLANEOUS) ×3
SET ARTHROSCOPY TUBING LN (MISCELLANEOUS) ×1 IMPLANT
SPONGE LAP 4X18 X RAY DECT (DISPOSABLE) ×3 IMPLANT
SUT ETHILON 3 0 PS 1 (SUTURE) ×2 IMPLANT
SUT MENISCAL KIT (KITS) IMPLANT
SYR 20ML ECCENTRIC (SYRINGE) ×3 IMPLANT
SYR CONTROL 10ML LL (SYRINGE) IMPLANT
SYR TB 1ML LUER SLIP (SYRINGE) ×3 IMPLANT
TOWEL OR 17X24 6PK STRL BLUE (TOWEL DISPOSABLE) ×3 IMPLANT
TOWEL OR 17X26 10 PK STRL BLUE (TOWEL DISPOSABLE) ×3 IMPLANT
TUBE CONNECTING 12'X1/4 (SUCTIONS) ×1
TUBE CONNECTING 12X1/4 (SUCTIONS) ×2 IMPLANT
WAND HAND CNTRL MULTIVAC 90 (MISCELLANEOUS) ×3 IMPLANT
WATER STERILE IRR 1000ML POUR (IV SOLUTION) ×3 IMPLANT

## 2015-06-18 NOTE — Anesthesia Procedure Notes (Signed)
Procedure Name: LMA Insertion Date/Time: 06/18/2015 7:46 AM Performed by: Manus Gunning, Cleotha Whalin J Pre-anesthesia Checklist: Patient identified, Emergency Drugs available, Suction available, Patient being monitored and Timeout performed Patient Re-evaluated:Patient Re-evaluated prior to inductionOxygen Delivery Method: Circle system utilized Preoxygenation: Pre-oxygenation with 100% oxygen Intubation Type: IV induction LMA: LMA inserted LMA Size: 4.0 Number of attempts: 1 Placement Confirmation: positive ETCO2 and breath sounds checked- equal and bilateral Tube secured with: Tape Dental Injury: Teeth and Oropharynx as per pre-operative assessment

## 2015-06-18 NOTE — Anesthesia Postprocedure Evaluation (Signed)
Anesthesia Post Note  Patient: Lisa Larson  Procedure(s) Performed: Procedure(s) (LRB): ARTHROSCOPY KNEE WITH DEBRIDEMENT, GANGLION CYST ASPIRATION (Right)  Anesthesia type: General  Patient location: PACU  Post pain: Pain level controlled  Post assessment: Post-op Vital signs reviewed  Last Vitals: BP 157/79 mmHg  Pulse 76  Temp(Src) 36.4 C (Oral)  Resp 18  Ht 5' (1.524 m)  Wt 166 lb (75.297 kg)  BMI 32.42 kg/m2  SpO2 96%  Post vital signs: Reviewed  Level of consciousness: sedated  Complications: No apparent anesthesia complications

## 2015-06-18 NOTE — Interval H&P Note (Signed)
History and Physical Interval Note:  06/18/2015 6:48 AM  Lisa Larson  has presented today for surgery, with the diagnosis of RIGHT KNEE LATERAL MENISCAL TEAR, GANGLION CYST  The various methods of treatment have been discussed with the patient and family. After consideration of risks, benefits and other options for treatment, the patient has consented to  Procedure(s) with comments: ARTHROSCOPY KNEE WITH DEBRIDEMENT, GANGLION CYST ASPIRATION (Right) - RIGHT KNEE DOA, DEBRIDEMENT, GANGLION CYST ASPIRATION as a surgical intervention .  The patient's history has been reviewed, patient examined, no change in status, stable for surgery.  I have reviewed the patient's chart and labs.  Questions were answered to the patient's satisfaction. Also plan cyst aspiration    Lisa Larson SCOTT

## 2015-06-18 NOTE — Anesthesia Preprocedure Evaluation (Signed)
Anesthesia Evaluation  Patient identified by MRN, date of birth, ID band Patient awake    Reviewed: Allergy & Precautions, NPO status , Patient's Chart, lab work & pertinent test results  Airway Mallampati: II  TM Distance: >3 FB Neck ROM: Full    Dental no notable dental hx.    Pulmonary asthma , COPD COPD inhaler, former smoker,  breath sounds clear to auscultation  Pulmonary exam normal       Cardiovascular hypertension, Pt. on medications Normal cardiovascular exam+ Valvular Problems/Murmurs Rhythm:Regular Rate:Normal     Neuro/Psych  Headaches, PSYCHIATRIC DISORDERS Anxiety Depression    GI/Hepatic Neg liver ROS, GERD-  ,  Endo/Other  negative endocrine ROS  Renal/GU negative Renal ROS     Musculoskeletal negative musculoskeletal ROS (+) Arthritis -,   Abdominal (+) + obese,   Peds  Hematology  (+) anemia ,   Anesthesia Other Findings   Reproductive/Obstetrics negative OB ROS                             Anesthesia Physical Anesthesia Plan  ASA: III  Anesthesia Plan: General   Post-op Pain Management:    Induction: Intravenous  Airway Management Planned: LMA  Additional Equipment: None  Intra-op Plan:   Post-operative Plan: Extubation in OR  Informed Consent: I have reviewed the patients History and Physical, chart, labs and discussed the procedure including the risks, benefits and alternatives for the proposed anesthesia with the patient or authorized representative who has indicated his/her understanding and acceptance.   Dental advisory given  Plan Discussed with: CRNA  Anesthesia Plan Comments:         Anesthesia Quick Evaluation

## 2015-06-18 NOTE — Transfer of Care (Signed)
Immediate Anesthesia Transfer of Care Note  Patient: Lisa Larson  Procedure(s) Performed: Procedure(s) with comments: ARTHROSCOPY KNEE WITH DEBRIDEMENT, GANGLION CYST ASPIRATION (Right) - RIGHT KNEE DOA, DEBRIDEMENT, GANGLION CYST ASPIRATION  Patient Location: PACU  Anesthesia Type:General  Level of Consciousness: awake  Airway & Oxygen Therapy: Patient Spontanous Breathing and Patient connected to nasal cannula oxygen  Post-op Assessment: Report given to RN and Post -op Vital signs reviewed and stable  Post vital signs: Reviewed and stable  Last Vitals:  Filed Vitals:   06/18/15 0649  BP: 168/85  Pulse: 68  Temp: 36.6 C  Resp: 20    Complications: No apparent anesthesia complications

## 2015-06-18 NOTE — Brief Op Note (Signed)
06/18/2015  8:45 AM  PATIENT:  Lisa Larson  74 y.o. female  PRE-OPERATIVE DIAGNOSIS:  RIGHT KNEE LATERAL MENISCAL TEAR, GANGLION CYST  POST-OPERATIVE DIAGNOSIS:  RIGHT KNEE LATERAL MENISCAL TEAR, GANGLION CYST medial meniscal tear grade 4 cm all 3 compartments  PROCEDURE:  Procedure(s) with comments: ARTHROSCOPY KNEE WITH DEBRIDEMENT, GANGLION CYST ASPIRATION (Right) - RIGHT KNEE DOA, DEBRIDEMENT, GANGLION CYST ASPIRATION  SURGEON:  Surgeon(s) and Role:    * Meredith Pel, MD - Primary  PHYSICIAN ASSISTANT:   ASSISTANTS: none   ANESTHESIA:   general  EBL:     BLOOD ADMINISTERED:none  DRAINS: none   LOCAL MEDICATIONS USED:  MARCAINE     SPECIMEN:  No Specimen  DISPOSITION OF SPECIMEN:  N/A  COUNTS:  YES  TOURNIQUET:    DICTATION: .Dragon Dictation  PLAN OF CARE: Discharge to home after PACU  PATIENT DISPOSITION:  PACU - hemodynamically stable.   Delay start of Pharmacological VTE agent (>24hrs) due to surgical blood loss or risk of bleeding: not applicable

## 2015-06-18 NOTE — Op Note (Signed)
[name], [name] NO.:  [id number]  MEDICAL RECORD NO.:  [id number]  LOCATION:  [facility]                         FACILITY:  [facility]  PHYSICIAN:  Anderson Malta, M.D.    DATE OF BIRTH:  Apr 02, 1941  DATE OF PROCEDURE:  06/18/2015 DATE OF DISCHARGE:  06/18/2015                              OPERATIVE REPORT   PREOPERATIVE DIAGNOSIS:  Right knee lateral meniscal tear.  POSTOPERATIVE DIAGNOSES:  Right knee lateral meniscal tear and right knee medial meniscal tear.  Ganglion cyst.  Grade 4 chondromalacia, all 3 compartments.  PROCEDURE:  Right knee arthroscopy, partial medial and lateral meniscectomies, aspiration of ganglion cyst.  SURGEON:  Anderson Malta, M.D.  ASSISTANT:  None.  ANESTHESIA:  General.  INDICATIONS:  Lisa Larson is a patient with right knee locking, presents for operative management after explanation of risks and benefits and failure of conservative therapy.  OPERATIVE FINDINGS: 1. Examination under anesthesia, range of motion is about 5-130     degrees of flexion with stability, varus-valgus stress.  ACL and     PCL intact.  No posterolateral rotatory instability is noted. 2. Diagnostic and operative arthroscopy:.     a.     Small tear of the medial meniscus involving about 30%      anterior-posterior width of the meniscus over a 1 cm area, she      also had a small dime-sized area of grade 4 chondromalacia on the      medial femoral condyle.     b.     Intact ACL and PCL.     c.     Tear and instability of the entire posterior horn of the      lateral meniscus with grade 3-4 chondromalacia over 50% of      weightbearing surface area of the lateral femoral condyle.     d.     Grade 4 chondromalacia with eburnated bone within the      trochlea and undersurface of the patella with no loose bodies in      the medial and lateral gutter.  PROCEDURE IN DETAIL:  The patient was brought to the operating room, where general endotracheal anesthesia was  induced.  Preoperative antibiotics were administered.  Time-out was called.  Right leg was prescrubbed with alcohol and Betadine, allowed to air dry, prepped with DuraPrep solution, draped in sterile manner.  Anterior, inferior, and lateral portals were established.  Anterior, inferior, medial portal established under direct visualization.  Diagnostic arthroscopy demonstrated significant wear in the patellofemoral compartment.  No loose chondral flaps.  No loose bodies in the medial and lateral gutter. Medial meniscus had a small tear, which was debrided back to a stable rim with a combination of basket punch and shaver.  ACL and PCL were intact.  Lateral compartment was inspected, more significant chondromalacia present on both the femur and tibia.  The lateral meniscus posterior horn was avulsed and degeneratively torn.  It was unstable in going into the joint.  Essentially, complete meniscectomy was performed.  Bleeding points were encountered and controlled using ArthroCare wand.  Following meniscectomy, attempt was made to aspirate cyst posterolaterally.  At this time, thorough irrigation was performed in the  joint.  Instruments were removed.  Portals were closed using 3-0 nylon.  Solution of Marcaine, morphine, clonidine injected into the knee.  The patient was placed in a bulky dressing.  Incision was closed using 3-0 nylon.  Tolerated the procedure well without immediate complications.     Anderson Malta, M.D.     GSD/MEDQ  D:  06/18/2015  T:  06/18/2015  Job:  [id number]

## 2015-06-19 ENCOUNTER — Encounter (HOSPITAL_COMMUNITY): Payer: Self-pay | Admitting: Orthopedic Surgery

## 2015-06-25 ENCOUNTER — Other Ambulatory Visit: Payer: Self-pay

## 2015-06-27 ENCOUNTER — Other Ambulatory Visit: Payer: Self-pay | Admitting: Internal Medicine

## 2015-06-27 ENCOUNTER — Ambulatory Visit (INDEPENDENT_AMBULATORY_CARE_PROVIDER_SITE_OTHER): Payer: Medicare Other | Admitting: Neurology

## 2015-06-27 ENCOUNTER — Encounter: Payer: Self-pay | Admitting: Neurology

## 2015-06-27 VITALS — BP 122/70 | HR 95 | Ht 60.0 in | Wt 165.0 lb

## 2015-06-27 DIAGNOSIS — F0781 Postconcussional syndrome: Secondary | ICD-10-CM | POA: Insufficient documentation

## 2015-06-27 DIAGNOSIS — R413 Other amnesia: Secondary | ICD-10-CM | POA: Diagnosis not present

## 2015-06-27 NOTE — Telephone Encounter (Signed)
OK X1  My retirement date is 12/29/2015; but I will be in office only T, Weds & Thurs during the months Oct-Dec.You should transition your care to another PCP by Oct 1,2016.   

## 2015-06-27 NOTE — Telephone Encounter (Signed)
Please advise, thanks.
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
## 2015-06-27 NOTE — Telephone Encounter (Signed)
Ativan RX faxed to [facility]
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
## 2015-07-20 ENCOUNTER — Other Ambulatory Visit (HOSPITAL_COMMUNITY): Payer: Self-pay | Admitting: Orthopedic Surgery

## 2015-07-20 ENCOUNTER — Ambulatory Visit (HOSPITAL_COMMUNITY)
Admission: RE | Admit: 2015-07-20 | Discharge: 2015-07-20 | Disposition: A | Discharge status: Home / Self Care | Payer: Medicare Other | Source: Ambulatory Visit | Attending: Internal Medicine | Admitting: Internal Medicine

## 2015-07-20 ENCOUNTER — Other Ambulatory Visit: Payer: Self-pay | Admitting: Internal Medicine

## 2015-07-20 DIAGNOSIS — M7989 Other specified soft tissue disorders: Secondary | ICD-10-CM | POA: Insufficient documentation

## 2015-07-20 DIAGNOSIS — M79604 Pain in right leg: Secondary | ICD-10-CM | POA: Insufficient documentation

## 2015-07-20 DIAGNOSIS — R609 Edema, unspecified: Secondary | ICD-10-CM | POA: Diagnosis not present

## 2015-07-20 NOTE — Progress Notes (Signed)
Right lower extremity venous duplex completed.  Right:  No evidence of DVT, superficial thrombosis, or Baker's cyst.  Left:  Negative for DVT in the common femoral vein.  

## 2015-07-28 ENCOUNTER — Other Ambulatory Visit: Payer: Self-pay | Admitting: Internal Medicine

## 2015-07-30 ENCOUNTER — Other Ambulatory Visit: Payer: Self-pay

## 2015-07-30 MED ORDER — DULOXETINE HCL 30 MG PO CPEP: ORAL_CAPSULE | ORAL | Status: DC

## 2015-08-09 ENCOUNTER — Encounter: Payer: Self-pay | Admitting: Internal Medicine

## 2015-08-09 DIAGNOSIS — M170 Bilateral primary osteoarthritis of knee: Secondary | ICD-10-CM | POA: Diagnosis not present

## 2015-08-21 ENCOUNTER — Other Ambulatory Visit: Payer: Self-pay | Admitting: Internal Medicine

## 2015-08-21 NOTE — Telephone Encounter (Signed)
OK X1  My retirement date is 12/29/2015; but I will be in office on a limited schedule Oct-Dec. To guarantee continuity of care you should transition your care to another PCP by Oct 1,2016.     

## 2015-08-21 NOTE — Telephone Encounter (Signed)
Please advise, thanks.

## 2015-08-22 ENCOUNTER — Other Ambulatory Visit: Payer: Self-pay | Admitting: Emergency Medicine

## 2015-08-22 MED ORDER — LORAZEPAM 1 MG PO TABS: ORAL_TABLET | ORAL | Status: DC

## 2015-08-22 NOTE — Telephone Encounter (Signed)
Ativan rx faxed to [facility]

## 2015-09-06 DIAGNOSIS — H11821 Conjunctivochalasis, right eye: Secondary | ICD-10-CM | POA: Diagnosis not present

## 2015-09-10 ENCOUNTER — Ambulatory Visit (INDEPENDENT_AMBULATORY_CARE_PROVIDER_SITE_OTHER): Payer: Medicare Other | Admitting: Pulmonary Disease

## 2015-09-10 ENCOUNTER — Encounter: Payer: Self-pay | Admitting: Pulmonary Disease

## 2015-09-10 VITALS — BP 128/70 | HR 84 | Temp 98.1°F | Ht 60.0 in | Wt 168.4 lb

## 2015-09-10 DIAGNOSIS — J45998 Other asthma: Secondary | ICD-10-CM

## 2015-09-10 DIAGNOSIS — J8281 Chronic eosinophilic pneumonia: Secondary | ICD-10-CM

## 2015-09-10 DIAGNOSIS — J82 Pulmonary eosinophilia, not elsewhere classified: Secondary | ICD-10-CM | POA: Diagnosis not present

## 2015-09-10 MED ORDER — BECLOMETHASONE DIPROPIONATE 80 MCG/ACT IN AERS: 2.0000 | INHALATION_SPRAY | Freq: Two times a day (BID) | RESPIRATORY_TRACT | Status: DC

## 2015-09-10 NOTE — Patient Instructions (Signed)
Qvar two puffs twice per day >> rinse mouth after each use  Call in two weeks to let Dr. Halford Chessman know if breathing better with Qvar inhaler  Follow up in 4 months

## 2015-09-10 NOTE — Progress Notes (Signed)
Chief Complaint  Patient presents with  . Follow-up    pt states shes had an increase in cough and some wheezing during the night. pt uses inhailer with great relief. no further complaints at this time,     History of Present Illness: Lisa Larson is a 74 y.o. 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  She was not using ICS or LABA inhalers >> to expensive.  She has noticed more cough and wheeze at night since her inhaler regimen was changed.  She uses proair, which is helpful.  She remains on 10 mg prednisone daily.  She denies chest pain, fever, hemoptysis, or skin rash.  She has trouble with her memory.  Tests: CBC 01/14/11>>39% eosinophils 01/14/11>> HIV negative, ACE 39, RF negative, ANA 1:40, ANCA negative  CT chest 01/16/11>>Multifocal b/l ASD with GGO BAL 01/21/11>>48% Eosinophils  IgE 02/25/11>>282 CT chest 07/22/11>>resolution of ASD CT chest 10/03/11>>recurrence of nodular infiltrate Rt upper lobe PFT 11/04/12>>FEV1 1.77 (87%), FEV1% 68, TLC 4.87 (101%), DLCO 69%, +BD from FEF 25-75%.  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  Physical Exam: BP 128/70 mmHg  Pulse 84  Temp(Src) 98.1 F (36.7 C)  Ht 5' (1.524 m)  Wt 168 lb 6.4 oz (76.386 kg)  BMI 32.89 kg/m2  SpO2 96%  General - obese HEENT - no sinus tenderness, no oral exudate, no LAN Chest - no wheeze/rales/dullness Cardiac - s1s2 regular, no murmur Abd - soft, nontender Ext - no edema Neuro - normal strength Skin - no rash Psych - normal mood, behavior  CBC Latest Ref Rng 06/14/2015 03/28/2015 03/24/2015  WBC 4.0 - 10.5 K/uL 9.1 8.4 7.9  Hemoglobin 12.0 - 15.0 g/dL 12.6 12.6 12.3  Hematocrit 36.0 - 46.0 % 38.1 36.0 37.2  Platelets 150 - 400 K/uL 174 206.0 144(L)      Assessment/Plan:  Eosinophilic pneumonia. Plan: - continue prednisone 10 mg daily for now >> if her respiratory symptoms  improve with adjustment to inhaler regimen, then will try to wean down prednisone  Persistent asthma. Plan: - will add Qvar >> she will check with her insurance about whether this is preferred inhaler on her formulary - asked her to call in two weeks to update her status - continue prn proair  Allergic rhinitis. Plan: - continue flonase   [name], MD [place] 09/10/2015, 2:42 PM Pager:  [phone] After 3pm call: [phone]

## 2015-09-11 ENCOUNTER — Telehealth: Payer: Self-pay | Admitting: *Deleted

## 2015-09-11 NOTE — Telephone Encounter (Signed)
Initiated PA for Qvar thru [employer]. Ref#  [id number]. Pending review. Will await response.

## 2015-09-12 ENCOUNTER — Encounter: Payer: Self-pay | Admitting: Pulmonary Disease

## 2015-09-12 NOTE — Telephone Encounter (Signed)
PA denied for Qvar. Insurance states needs to try Asmanex HFA, Asmanex Twisthaler or Pulmicort Flexhaler. Please advise. Thanks

## 2015-09-12 NOTE — Telephone Encounter (Signed)
Dr Halford Chessman, the following email was received from pt:  dr. Halford Chessman I checked with insurance co.they won't pay for qvar. there's no generic. alternative could be spiriva of serevent. could you please call one of them in if ok with you to [facility] . [phone].  thank you for your help.  Lisa Larson, [place] [id number].

## 2015-09-13 MED ORDER — MOMETASONE FUROATE 220 MCG/INH IN AEPB: 2.0000 | INHALATION_SPRAY | Freq: Every day | RESPIRATORY_TRACT | Status: DC

## 2015-09-13 NOTE — Telephone Encounter (Signed)
Please let her know I have sent order for asmanex to her pharmacy.

## 2015-09-13 NOTE — Telephone Encounter (Signed)
Patient notified.  Nothing further needed. 

## 2015-09-15 ENCOUNTER — Other Ambulatory Visit: Payer: Self-pay | Admitting: Pulmonary Disease

## 2015-09-19 ENCOUNTER — Encounter: Payer: Self-pay | Admitting: Pulmonary Disease

## 2015-09-19 MED ORDER — MOMETASONE FUROATE 220 MCG/INH IN AEPB
2.0000 | INHALATION_SPRAY | Freq: Every day | RESPIRATORY_TRACT | Status: DC
Start: 2015-09-19 — End: 2015-10-30

## 2015-09-19 NOTE — Telephone Encounter (Signed)
Pt email: Dr. Halford Larson I rechecked with my insurance company. , they gave me 3 more names to sub for Qvar. Flovent, Amicort, Areobid. you h ad prescribed 80 mcg oral inhaler Qvar. if one of these will work, will you please call in to [facility] at the corner of [address] . ALso, last I checked they hadn't received a reorder for 10MG  Predisone. will you call it in please too? Thanking you in advance.  Lisa Larson   Please advise Dr. Halford Larson thanks

## 2015-09-19 NOTE — Telephone Encounter (Signed)
I sent order for asmanex to her pharmacy on 09/13/15.  Please verify that her pharmacy has this order >> if not, then re-order this.

## 2015-10-09 ENCOUNTER — Other Ambulatory Visit: Payer: Self-pay | Admitting: Internal Medicine

## 2015-10-10 DIAGNOSIS — M545 Low back pain: Secondary | ICD-10-CM | POA: Diagnosis not present

## 2015-10-17 ENCOUNTER — Other Ambulatory Visit: Payer: Self-pay | Admitting: Orthopedic Surgery

## 2015-10-17 DIAGNOSIS — M545 Low back pain, unspecified: Secondary | ICD-10-CM

## 2015-10-17 DIAGNOSIS — G8929 Other chronic pain: Secondary | ICD-10-CM

## 2015-10-18 ENCOUNTER — Ambulatory Visit
Admission: RE | Admit: 2015-10-18 | Discharge: 2015-10-18 | Disposition: A | Discharge status: Home / Self Care | Payer: Medicare Other | Source: Ambulatory Visit | Attending: Orthopedic Surgery | Admitting: Orthopedic Surgery

## 2015-10-18 DIAGNOSIS — M545 Low back pain, unspecified: Secondary | ICD-10-CM

## 2015-10-18 DIAGNOSIS — G8929 Other chronic pain: Secondary | ICD-10-CM

## 2015-10-18 MED ORDER — IOHEXOL 180 MG/ML  SOLN
1.0000 mL | Freq: Once | INTRAMUSCULAR | Status: DC | PRN
  Administered 2015-10-18: 1 mL via EPIDURAL

## 2015-10-18 MED ORDER — METHYLPREDNISOLONE ACETATE 40 MG/ML INJ SUSP (RADIOLOG
120.0000 mg | Freq: Once | INTRAMUSCULAR | Status: AC
  Administered 2015-10-18: 120 mg via EPIDURAL

## 2015-10-18 NOTE — Discharge Instructions (Signed)

## 2015-10-30 ENCOUNTER — Ambulatory Visit (INDEPENDENT_AMBULATORY_CARE_PROVIDER_SITE_OTHER): Payer: Medicare Other | Admitting: Internal Medicine

## 2015-10-30 ENCOUNTER — Encounter: Payer: Self-pay | Admitting: Internal Medicine

## 2015-10-30 ENCOUNTER — Other Ambulatory Visit (INDEPENDENT_AMBULATORY_CARE_PROVIDER_SITE_OTHER): Payer: Medicare Other

## 2015-10-30 VITALS — BP 140/80 | HR 74 | Temp 97.6°F | Ht 60.0 in | Wt 168.5 lb

## 2015-10-30 DIAGNOSIS — R739 Hyperglycemia, unspecified: Secondary | ICD-10-CM | POA: Diagnosis not present

## 2015-10-30 DIAGNOSIS — E785 Hyperlipidemia, unspecified: Secondary | ICD-10-CM | POA: Diagnosis not present

## 2015-10-30 DIAGNOSIS — Z23 Encounter for immunization: Secondary | ICD-10-CM | POA: Diagnosis not present

## 2015-10-30 DIAGNOSIS — I10 Essential (primary) hypertension: Secondary | ICD-10-CM

## 2015-10-30 LAB — HEPATIC FUNCTION PANEL
ALT: 45 U/L — ABNORMAL HIGH (ref 0–35)
AST: 36 U/L (ref 0–37)
Albumin: 4.1 g/dL (ref 3.5–5.2)
Alkaline Phosphatase: 57 U/L (ref 39–117)
Bilirubin, Direct: 0.2 mg/dL (ref 0.0–0.3)
Total Bilirubin: 0.9 mg/dL (ref 0.2–1.2)
Total Protein: 7 g/dL (ref 6.0–8.3)

## 2015-10-30 LAB — HEMOGLOBIN A1C: Hgb A1c MFr Bld: 5.2 % (ref 4.6–6.5)

## 2015-10-30 LAB — LIPID PANEL
Cholesterol: 211 mg/dL — ABNORMAL HIGH (ref 0–200)
HDL: 87.9 mg/dL (ref >39.00)
LDL Cholesterol: 105 mg/dL — ABNORMAL HIGH (ref 0–99)
NonHDL: 122.93
Total CHOL/HDL Ratio: 2
Triglycerides: 90 mg/dL (ref 0.0–149.0)
VLDL: 18 mg/dL (ref 0.0–40.0)

## 2015-10-30 LAB — TSH: TSH: 2.26 u[IU]/mL (ref 0.35–4.50)

## 2015-10-30 MED ORDER — LORAZEPAM 1 MG PO TABS: ORAL_TABLET | ORAL | Status: DC

## 2015-10-30 MED ORDER — DULOXETINE HCL 30 MG PO CPEP: ORAL_CAPSULE | ORAL | Status: DC

## 2015-10-30 MED ORDER — MOMETASONE FUROATE 220 MCG/INH IN AEPB: 2.0000 | INHALATION_SPRAY | Freq: Every day | RESPIRATORY_TRACT | Status: DC

## 2015-10-30 MED ORDER — FLUTICASONE PROPIONATE 50 MCG/ACT NA SUSP: 2.0000 | Freq: Every day | NASAL | Status: DC

## 2015-10-30 MED ORDER — LOSARTAN POTASSIUM 100 MG PO TABS: 100.0000 mg | ORAL_TABLET | Freq: Every day | ORAL | Status: DC

## 2015-10-30 MED ORDER — GABAPENTIN 100 MG PO CAPS: 100.0000 mg | ORAL_CAPSULE | Freq: Three times a day (TID) | ORAL | Status: DC

## 2015-10-30 MED ORDER — CETIRIZINE HCL 10 MG PO CAPS: 10.0000 mg | ORAL_CAPSULE | Freq: Every day | ORAL | Status: DC

## 2015-10-30 NOTE — Assessment & Plan Note (Signed)
[facility]

## 2015-10-30 NOTE — Progress Notes (Signed)
   Subjective:    Patient ID: Lisa Larson, female    DOB: 1941-05-08, 74 y.o.   MRN: [id number]  HPI The patient is here to assess status of active health conditions.  PMH, FH, & Social History reviewed & updated.No change in [place] as recorded.  She is on a modified heart healthy, low-salt diet. She drinks 2-3 alcoholic beverages a day. She is unable to exercise due to her musculoskeletal issues. Blood pressure at doctor offices tends to average in the 140s over 80s.  She has had labs performed on multiple occasions since March of this year. Most recent labs were 06/14/15. GFR was slightly reduced at 59; glucose was 174. Her last A1c was 5.6% on 03/29/15. Significantly she's had an epidural steroid injection recently. She is also on prednisone 10 mg daily.  She does have some polydipsia but not polyphagia or polyuria. She does not have numbness, tingling or burning in the feet. She is on gabapentin 100 mg 3 times a day.  Review of Systems  There is no significant cough, sputum production,hemoptysis, wheezing,or  paroxysmal nocturnal dyspnea. Unexplained weight loss, abdominal pain, significant dyspepsia, dysphagia, melena, rectal bleeding, or persistently small caliber stools are not present. Dysuria, pyuria, hematuria, frequency, or nocturia are denied.    Objective:   Physical Exam  Pertinent or positive findings include: Moon facies are present. She has an upper plate. She has some wax in both otic canals without blockage. Chest surprisingly clear. Gait is broad; she uses a cane. She has decreased dorsalis pedis pulses. Knee reflexes are 0+. There are fusiform changes in the knees with marked crepitus. 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  General appearance :adequately nourished; in no distress.  Eyes: No conjunctival inflammation or scleral icterus is present.  Oral exam:  Lips and gums are healthy appearing.There is no oropharyngeal  erythema or exudate noted.   Heart:  Normal rate and regular rhythm. S1 and S2 normal without gallop, murmur, click, rub or other extra sounds    Lungs:Chest clear to auscultation; no wheezes, rhonchi,rales ,or rubs present.No increased work of breathing.   Abdomen: bowel sounds normal, soft and non-tender without masses, organomegaly or hernias noted.  No guarding or rebound.   Vascular : all pulses equal ; no bruits present.  Skin:Warm & dry.  Intact without suspicious lesions or rashes ; no tenting or jaundice   Lymphatic: No lymphadenopathy is noted about the head, neck, axilla   Neuro: Strength, tone decreased..     Assessment & Plan:  See Current Assessment & Plan in Problem List under specific Diagnosis

## 2015-10-30 NOTE — Patient Instructions (Signed)
  Your next office appointment will be determined based upon review of your pending labs.  Those written interpretation of the lab results and instructions will be transmitted to you by My Chart   Critical results will be called.   Followup as needed for any active or acute issue. Please report any significant change in your symptoms.  The high-dose flu shot will be administered today; pneumonia immunization can be administered next week. Shingles shot can be scheduled after that if okay with your Pulmonologist because of the oral prednisone long-term.  Lorazepam cannot be taken with alcohol because of the risk of altered mental status and falls. The same risk would be associated with gabapentin and alcohol taken together.

## 2015-10-30 NOTE — Assessment & Plan Note (Signed)
Lipids, LFTs, TSH  

## 2015-10-30 NOTE — Progress Notes (Signed)
Pre visit review using our clinic review tool, if applicable. No additional management support is needed unless otherwise documented below in the visit note. 

## 2015-10-30 NOTE — Assessment & Plan Note (Signed)
A1c

## 2015-11-01 DIAGNOSIS — M545 Low back pain: Secondary | ICD-10-CM | POA: Diagnosis not present

## 2015-11-07 DIAGNOSIS — S12112A Nondisplaced Type II dens fracture, initial encounter for closed fracture: Secondary | ICD-10-CM | POA: Diagnosis not present

## 2015-11-12 ENCOUNTER — Emergency Department (HOSPITAL_COMMUNITY)
Admission: EM | Admit: 2015-11-12 | Discharge: 2015-11-12 | Disposition: A | Discharge status: Home / Self Care | Payer: Medicare Other | Attending: Emergency Medicine | Admitting: Emergency Medicine

## 2015-11-12 ENCOUNTER — Emergency Department (HOSPITAL_COMMUNITY): Payer: Medicare Other

## 2015-11-12 ENCOUNTER — Encounter (HOSPITAL_COMMUNITY): Payer: Self-pay | Admitting: Emergency Medicine

## 2015-11-12 ENCOUNTER — Telehealth: Payer: Self-pay | Admitting: Internal Medicine

## 2015-11-12 DIAGNOSIS — M545 Low back pain, unspecified: Secondary | ICD-10-CM

## 2015-11-12 DIAGNOSIS — S3992XA Unspecified injury of lower back, initial encounter: Secondary | ICD-10-CM | POA: Diagnosis present

## 2015-11-12 DIAGNOSIS — R011 Cardiac murmur, unspecified: Secondary | ICD-10-CM | POA: Insufficient documentation

## 2015-11-12 DIAGNOSIS — S34109A Unspecified injury to unspecified level of lumbar spinal cord, initial encounter: Secondary | ICD-10-CM | POA: Diagnosis not present

## 2015-11-12 DIAGNOSIS — M8580 Other specified disorders of bone density and structure, unspecified site: Secondary | ICD-10-CM | POA: Insufficient documentation

## 2015-11-12 DIAGNOSIS — W010XXA Fall on same level from slipping, tripping and stumbling without subsequent striking against object, initial encounter: Secondary | ICD-10-CM | POA: Diagnosis not present

## 2015-11-12 DIAGNOSIS — Z7952 Long term (current) use of systemic steroids: Secondary | ICD-10-CM | POA: Diagnosis not present

## 2015-11-12 DIAGNOSIS — K219 Gastro-esophageal reflux disease without esophagitis: Secondary | ICD-10-CM | POA: Insufficient documentation

## 2015-11-12 DIAGNOSIS — Z7951 Long term (current) use of inhaled steroids: Secondary | ICD-10-CM | POA: Diagnosis not present

## 2015-11-12 DIAGNOSIS — M5136 Other intervertebral disc degeneration, lumbar region: Secondary | ICD-10-CM | POA: Diagnosis not present

## 2015-11-12 DIAGNOSIS — F329 Major depressive disorder, single episode, unspecified: Secondary | ICD-10-CM | POA: Insufficient documentation

## 2015-11-12 DIAGNOSIS — M533 Sacrococcygeal disorders, not elsewhere classified: Secondary | ICD-10-CM | POA: Diagnosis not present

## 2015-11-12 DIAGNOSIS — G8929 Other chronic pain: Secondary | ICD-10-CM | POA: Diagnosis not present

## 2015-11-12 DIAGNOSIS — M4806 Spinal stenosis, lumbar region: Secondary | ICD-10-CM | POA: Diagnosis not present

## 2015-11-12 DIAGNOSIS — Z87891 Personal history of nicotine dependence: Secondary | ICD-10-CM | POA: Diagnosis not present

## 2015-11-12 DIAGNOSIS — Z8701 Personal history of pneumonia (recurrent): Secondary | ICD-10-CM | POA: Insufficient documentation

## 2015-11-12 DIAGNOSIS — M4807 Spinal stenosis, lumbosacral region: Secondary | ICD-10-CM | POA: Insufficient documentation

## 2015-11-12 DIAGNOSIS — Y9389 Activity, other specified: Secondary | ICD-10-CM | POA: Diagnosis not present

## 2015-11-12 DIAGNOSIS — M109 Gout, unspecified: Secondary | ICD-10-CM | POA: Insufficient documentation

## 2015-11-12 DIAGNOSIS — M5186 Other intervertebral disc disorders, lumbar region: Secondary | ICD-10-CM | POA: Diagnosis not present

## 2015-11-12 DIAGNOSIS — M1990 Unspecified osteoarthritis, unspecified site: Secondary | ICD-10-CM | POA: Insufficient documentation

## 2015-11-12 DIAGNOSIS — Z79899 Other long term (current) drug therapy: Secondary | ICD-10-CM | POA: Diagnosis not present

## 2015-11-12 DIAGNOSIS — Z8781 Personal history of (healed) traumatic fracture: Secondary | ICD-10-CM | POA: Insufficient documentation

## 2015-11-12 DIAGNOSIS — J449 Chronic obstructive pulmonary disease, unspecified: Secondary | ICD-10-CM | POA: Insufficient documentation

## 2015-11-12 DIAGNOSIS — I10 Essential (primary) hypertension: Secondary | ICD-10-CM | POA: Diagnosis not present

## 2015-11-12 DIAGNOSIS — R102 Pelvic and perineal pain: Secondary | ICD-10-CM | POA: Diagnosis not present

## 2015-11-12 DIAGNOSIS — Y9289 Other specified places as the place of occurrence of the external cause: Secondary | ICD-10-CM | POA: Diagnosis not present

## 2015-11-12 DIAGNOSIS — Y998 Other external cause status: Secondary | ICD-10-CM | POA: Diagnosis not present

## 2015-11-12 MED ORDER — HYDROCODONE-ACETAMINOPHEN 5-325 MG PO TABS: 1.0000 | ORAL_TABLET | Freq: Four times a day (QID) | ORAL | Status: DC | PRN

## 2015-11-12 MED ORDER — HYDROCODONE-ACETAMINOPHEN 5-325 MG PO TABS
2.0000 | ORAL_TABLET | Freq: Once | ORAL | Status: AC
  Administered 2015-11-12: 2 via ORAL
  Filled 2015-11-12: qty 2

## 2015-11-12 NOTE — Progress Notes (Signed)
CSW met with patient at bedside. Daughter was present. Patient confirms that she presents to [facility] due to falling on Saturday. Per note, patient c/o back pain. Patient states that she fell due to losing balance while trying to refill a bird feeder. Also, patient confirms that she comes from home. Patient states that she lives alone in [place].  Patient states that she has been completing her ADL's independently, but is interested in home health care. Patient states that she is not interested in facility. According to patient, she does not fall often and this is the first fall within 6 months.  Patient informed CSW that her primary support is her son and daughter who both live in [place].  Daughter/[name] [phone] Son/Will [phone]  Willette Brace [phone] ED CSW 11/12/2015 3:25 PM

## 2015-11-12 NOTE — Discharge Instructions (Signed)
It was our pleasure to provide your ER care today - we hope that you feel better.  Rest. Avoid bending at waist, or heavy lifting > 10 lbs for the next week.  You may try heat to sore area for symptom relief.  You may take hydrocodone as need for pain - one to two every 6 hours. Do not take tylenol or acetaminophen containing medication when taking hydrocodone.  Your xrays were read by our radiologist as showing:  FINDINGS: There is significant disc height loss at L5-S1. Anterolisthesis of L4 on L5 measures 4 mm and is likely degenerative. There is significant facet hypertrophy in the lower lumbar levels. No acute fracture or traumatic subluxation. No suspicious lytic or blastic lesions are identified. Bowel gas pattern is nonobstructive. Note is made of gallstones in the right upper quadrant.  IMPRESSION: 1. Degenerative changes. 2. Grade 1 anterolisthesis L4 on L5. 3. Cholelithiasis.  Please note also the incidental (i.e. unrelated to today's pain) of gallstones in your gallbladder - discuss with primary care doctor at follow up with them.   Follow up with your doctor in the coming week for recheck.  Follow up with the home health services provider in the next day.   Return to ER if worse, new symptoms, fevers, numbness/weakness, medical emergency, other concern.     Back Pain, Adult Back pain is very common in adults.The cause of back pain is rarely dangerous and the pain often gets better over time.The cause of your back pain may not be known. Some common causes of back pain include:  Strain of the muscles or ligaments supporting the spine.  Wear and tear (degeneration) of the spinal disks.  Arthritis.  Direct injury to the back. For many people, back pain may return. Since back pain is rarely dangerous, most people can learn to manage this condition on their own. HOME CARE INSTRUCTIONS Watch your back pain for any changes. The following actions may help to lessen  any discomfort you are feeling:  Remain active. It is stressful on your back to sit or stand in one place for long periods of time. Do not sit, drive, or stand in one place for more than 30 minutes at a time. Take short walks on even surfaces as soon as you are able.Try to increase the length of time you walk each day.  Exercise regularly as directed by your health care provider. Exercise helps your back heal faster. It also helps avoid future injury by keeping your muscles strong and flexible.  Do not stay in bed.Resting more than 1-2 days can delay your recovery.  Pay attention to your body when you bend and lift. The most comfortable positions are those that put less stress on your recovering back. Always use proper lifting techniques, including:  Bending your knees.  Keeping the load close to your body.  Avoiding twisting.  Find a comfortable position to sleep. Use a firm mattress and lie on your side with your knees slightly bent. If you lie on your back, put a pillow under your knees.  Avoid feeling anxious or stressed.Stress increases muscle tension and can worsen back pain.It is important to recognize when you are anxious or stressed and learn ways to manage it, such as with exercise.  Take medicines only as directed by your health care provider. Over-the-counter medicines to reduce pain and inflammation are often the most helpful.Your health care provider may prescribe muscle relaxant drugs.These medicines help dull your pain so you can more quickly  return to your normal activities and healthy exercise.  Apply ice to the injured area:  Put ice in a plastic bag.  Place a towel between your skin and the bag.  Leave the ice on for 20 minutes, 2-3 times a day for the first 2-3 days. After that, ice and heat may be alternated to reduce pain and spasms.  Maintain a healthy weight. Excess weight puts extra stress on your back and makes it difficult to maintain good  posture. SEEK MEDICAL CARE IF:  You have pain that is not relieved with rest or medicine.  You have increasing pain going down into the legs or buttocks.  You have pain that does not improve in one week.  You have night pain.  You lose weight.  You have a fever or chills. SEEK IMMEDIATE MEDICAL CARE IF:   You develop new bowel or bladder control problems.  You have unusual weakness or numbness in your arms or legs.  You develop nausea or vomiting.  You develop abdominal pain.  You feel faint.   This information is not intended to replace advice given to you by your health care provider. Make sure you discuss any questions you have with your health care provider.   Document Released: 12/15/2005 Document Revised: 01/05/2015 Document Reviewed: 04/18/2014 Elsevier Interactive Patient Education 2016 [employer]  Degenerative Disk Disease Degenerative disk disease is a condition caused by the changes that occur in spinal disks as you grow older. Spinal disks are soft and compressible disks located between the bones of your spine (vertebrae). These disks act like shock absorbers. Degenerative disk disease can affect the whole spine. However, the neck and lower back are most commonly affected. Many changes can occur in the spinal disks with aging, such as:  The spinal disks may dry and shrink.  Small tears may occur in the tough, outer covering of the disk (annulus).  The disk space may become smaller due to loss of water.  Abnormal growths in the bone (spurs) may occur. This can put pressure on the nerve roots exiting the spinal canal, causing pain.  The spinal canal may become narrowed. RISK FACTORS   Being overweight.  Having a family history of degenerative disk disease.  Smoking.  There is increased risk if you are doing heavy lifting or have a sudden injury. SIGNS AND SYMPTOMS  Symptoms vary from person to person and may include:  Pain that varies in intensity.  Some people have no pain, while others have severe pain. The location of the pain depends on the part of your backbone that is affected.  You will have neck or arm pain if a disk in the neck area is affected.  You will have pain in your back, buttocks, or legs if a disk in the lower back is affected.  Pain that becomes worse while bending, reaching up, or with twisting movements.  Pain that may start gradually and then get worse as time passes. It may also start after a major or minor injury.  Numbness or tingling in the arms or legs. DIAGNOSIS  Your health care provider will ask you about your symptoms and about activities or habits that may cause the pain. He or she may also ask about any injuries, diseases, or treatments you have had. Your health care provider will examine you to check for the range of movement that is possible in the affected area, to check for strength in your extremities, and to check for sensation in the  areas of the arms and legs supplied by different nerve roots. You may also have:   An X-ray of the spine.  Other imaging tests, such as MRI. TREATMENT  Your health care provider will advise you on the best plan for treatment. Treatment may include:  Medicines.  Rehabilitation exercises. HOME CARE INSTRUCTIONS   Follow proper lifting and walking techniques as advised by your health care provider.  Maintain good posture.  Exercise regularly as advised by your health care provider.  Perform relaxation exercises.  Change your sitting, standing, and sleeping habits as advised by your health care provider.  Change positions frequently.  Lose weight or maintain a healthy weight as advised by your health care provider.  Do not use any tobacco products, including cigarettes, chewing tobacco, or electronic cigarettes. If you need help quitting, ask your health care provider.  Wear supportive footwear.  Take medicines only as directed by your health care  provider. SEEK MEDICAL CARE IF:   Your pain does not go away within 1-4 weeks.  You have significant appetite or weight loss. SEEK IMMEDIATE MEDICAL CARE IF:   Your pain is severe.  You notice weakness in your arms, hands, or legs.  You begin to lose control of your bladder or bowel movements.  You have fevers or night sweats. MAKE SURE YOU:   Understand these instructions.  Will watch your condition.  Will get help right away if you are not doing well or get worse.   This information is not intended to replace advice given to you by your health care provider. Make sure you discuss any questions you have with your health care provider.   Document Released: 10/12/2007 Document Revised: 01/05/2015 Document Reviewed: 04/18/2014 Elsevier Interactive Patient Education 2016 [place] Injury Prevention Back injuries can be very painful. They can also be difficult to heal. After having one back injury, you are more likely to injure your back again. It is important to learn how to avoid injuring or re-injuring your back. The following tips can help you to prevent a back injury. WHAT SHOULD I KNOW ABOUT PHYSICAL FITNESS?  Exercise for 30 minutes per day on most days of the week or as directed by your health care provider. Make sure to:  Do aerobic exercises, such as walking, jogging, biking, or swimming.  Do exercises that increase balance and strength, such as tai chi and yoga. These can decrease your risk of falling and injuring your back.  Do stretching exercises to help with flexibility.  Try to develop strong abdominal muscles. Your abdominal muscles provide a lot of the support that is needed by your back.  Maintain a healthy weight. This helps to decrease your risk of a back injury. WHAT SHOULD I KNOW ABOUT MY DIET?  Talk with your health care provider about your overall diet. Take supplements and vitamins only as directed by your health care provider.  Talk  with your health care provider about how much calcium and vitamin D you need each day. These nutrients help to prevent weakening of the bones (osteoporosis). Osteoporosis can cause broken (fractured) bones, which lead to back pain.  Include good sources of calcium in your diet, such as dairy products, green leafy vegetables, and products that have had calcium added to them (fortified).  Include good sources of vitamin D in your diet, such as milk and foods that are fortified with vitamin D. WHAT SHOULD I KNOW ABOUT MY POSTURE?  Sit up straight and stand  up straight. Avoid leaning forward when you sit or hunching over when you stand.  Choose chairs that have good low-back (lumbar) support.  If you work at a desk, sit close to it so you do not need to lean over. Keep your chin tucked in. Keep your neck drawn back, and keep your elbows bent at a right angle. Your arms should look like the letter "L."  Sit high and close to the steering wheel when you drive. Add a lumbar support to your car seat, if needed.  Avoid sitting or standing in one position for very long. Take breaks to get up, stretch, and walk around at least one time every hour. Take breaks every hour if you are driving for long periods of time.  Sleep on your side with your knees slightly bent, or sleep on your back with a pillow under your knees. Do not lie on the front of your body to sleep. WHAT SHOULD I KNOW ABOUT LIFTING, TWISTING, AND REACHING? Lifting and Heavy Lifting  Avoid heavy lifting, especially repetitive heavy lifting. If you must do heavy lifting:  Stretch before lifting.  Work slowly.  Rest between lifts.  Use a tool such as a cart or a dolly to move objects if one is available.  Make several small trips instead of carrying one heavy load.  Ask for help when you need it, especially when moving big objects.  Follow these steps when lifting:  Stand with your feet shoulder-width apart.  Get as close to  the object as you can. Do not try to pick up a heavy object that is far from your body.  Use handles or lifting straps if they are available.  Bend at your knees. Squat down, but keep your heels off the floor.  Keep your shoulders pulled back, your chin tucked in, and your back straight.  Lift the object slowly while you tighten the muscles in your legs, abdomen, and buttocks. Keep the object as close to the center of your body as possible.  Follow these steps when putting down a heavy load:  Stand with your feet shoulder-width apart.  Lower the object slowly while you tighten the muscles in your legs, abdomen, and buttocks. Keep the object as close to the center of your body as possible.  Keep your shoulders pulled back, your chin tucked in, and your back straight.  Bend at your knees. Squat down, but keep your heels off the floor.  Use handles or lifting straps if they are available. Twisting and Reaching  Avoid lifting heavy objects above your waist.  Do not twist at your waist while you are lifting or carrying a load. If you need to turn, move your feet.  Do not bend over without bending at your knees.  Avoid reaching over your head, across a table, or for an object on a high surface. WHAT ARE SOME OTHER TIPS?  Avoid wet floors and icy ground. Keep sidewalks clear of ice to prevent falls.  Do not sleep on a mattress that is too soft or too hard.  Keep items that are used frequently within easy reach.  Put heavier objects on shelves at waist level, and put lighter objects on lower or higher shelves.  Find ways to decrease your stress, such as exercise, massage, or relaxation techniques. Stress can build up in your muscles. Tense muscles are more vulnerable to injury.  Talk with your health care provider if you feel anxious or depressed. These conditions can make  back pain worse.  Wear flat heel shoes with cushioned soles.  Avoid sudden movements.  Use both shoulder  straps when carrying a backpack.  Do not use any tobacco products, including cigarettes, chewing tobacco, or electronic cigarettes. If you need help quitting, ask your health care provider.   This information is not intended to replace advice given to you by your health care provider. Make sure you discuss any questions you have with your health care provider.   Document Released: 01/22/2005 Document Revised: 05/01/2015 Document Reviewed: 12/19/2014 Elsevier Interactive Patient Education [employer].

## 2015-11-12 NOTE — ED Notes (Signed)
Per EMS pt chronic back pain acute onset starting last Wednesday noted while at [place] office; fall to tailbone on Saturday but denies change in back pain. Pt reports back pain the same as noted at doctor's office on Wednesday AND tailbone pain worse since Saturday. Pt reports able to get around but hurts tremendously.

## 2015-11-12 NOTE — Telephone Encounter (Signed)
Annette call from [place] request verbal order for home health nurse to go to Mrs. Demaris Callander. Pt was seen in the ED. Please call her back  Caller: Anne Ng Phone # [phone]
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
## 2015-11-12 NOTE — ED Notes (Signed)
Bed: WA19 Expected date:  Expected time:  Means of arrival:  Comments: Ems-fall 

## 2015-11-12 NOTE — Telephone Encounter (Signed)
OK 

## 2015-11-12 NOTE — ED Notes (Signed)
Pt ambulated in room from bed to door with 2 person assist, complained of severe pain when out of bed.

## 2015-11-12 NOTE — Care Management Note (Addendum)
Case Management Note  Patient Details  Name: Lisa Larson MRN: [id number] Date of Birth: [birth year]  Subjective/Objective:                   74 yr old female with fall Pt states previously used [facility]  Pt choice of home health agency is [facility] today Pt interested in having same staff previously used Pt voiced interest in services that also can help her daughter's ex mother in [place] Cm referred back to this person's pcp for qualifying for home health but provided PDN & [facility] [place] [facility]  Action/Plan: CM assessed pt for home health needs/DME needs  CM reviewed in details medicare guidelines, home health [facility]) (length of stay in home, types of [facility] staff available, coverage, primary caregiver, up to 24 hrs before services may be started) and Private duty nursing (PDN-coverage, length of stay in the home types of staff available). CM reviewed availability of [place] [facility] to assist pcp to get pt to snf (if desired disposition) from the community level. CM provided pt/family with a list of [place] home health agencies and PDN.  Pt states she is not able to afford PDN Pt states she has bedside commode, walker, cane Denies need for DME as confirmed by son at bedside CM business card provided to pt Request EDP name Cm informed her his name would be on d/c instructions given to her by ED RN Discussed use of EMS to get home Pt requesting daughter to provide her house key Daughter arrived with Key Pt seen by ED RN [phone] CM spoke with Shelle Iron to provide referral for [facility], PT, OT and Aide  EDP, steinl and RN, Maylon Cos updated Pending EDP entry of F2F  Expected Discharge Date:      11/12/15 home with EMS             Expected Discharge Plan:  [place]  In-House Referral:  NA  Discharge planning Services  CM Consult  Post Acute Care Choice:  Home Health Choice offered to:  Patient  DME Arranged:   n/a DME Agency:   n/a  HH Arranged:   HHRN, HHPT, [place],  [place] Agency:  [facility]  Status of Service:  Completed, signed off   Additional Comments:  Robbie Lis, RN 11/12/2015, 2:02 PM

## 2015-11-13 ENCOUNTER — Ambulatory Visit: Payer: Medicare Other

## 2015-11-13 ENCOUNTER — Telehealth: Payer: Self-pay | Admitting: Internal Medicine

## 2015-11-13 DIAGNOSIS — Z9181 History of falling: Secondary | ICD-10-CM | POA: Diagnosis not present

## 2015-11-13 DIAGNOSIS — M47817 Spondylosis without myelopathy or radiculopathy, lumbosacral region: Secondary | ICD-10-CM | POA: Diagnosis not present

## 2015-11-13 NOTE — Telephone Encounter (Signed)
Notified annette with md response...Johny Chess

## 2015-11-13 NOTE — Telephone Encounter (Signed)
Verbal okay given for OT nursing 2x week for 2 weeks. Also verbal okay to attain orders from Dr. Marlou Sa and Dr. Ellene Route (specialist) as needed and to communicate orders attained to [place] so there is continuity of core.

## 2015-11-13 NOTE — Telephone Encounter (Signed)
Did admit patient for services.  Is requesting orders for OT to help with self care and mobility saftey.  Nursing 2x week for 2 weeks. Would like order to get ok with Dr. Stevan Born sure what kind of MD or where this MD is at) and Dr. Carlos Levering ([place]) as needed.

## 2015-11-14 DIAGNOSIS — Z9181 History of falling: Secondary | ICD-10-CM | POA: Diagnosis not present

## 2015-11-14 DIAGNOSIS — M47817 Spondylosis without myelopathy or radiculopathy, lumbosacral region: Secondary | ICD-10-CM | POA: Diagnosis not present

## 2015-11-15 DIAGNOSIS — Z9181 History of falling: Secondary | ICD-10-CM | POA: Diagnosis not present

## 2015-11-15 DIAGNOSIS — M47817 Spondylosis without myelopathy or radiculopathy, lumbosacral region: Secondary | ICD-10-CM | POA: Diagnosis not present

## 2015-11-19 DIAGNOSIS — M47817 Spondylosis without myelopathy or radiculopathy, lumbosacral region: Secondary | ICD-10-CM | POA: Diagnosis not present

## 2015-11-19 DIAGNOSIS — Z9181 History of falling: Secondary | ICD-10-CM | POA: Diagnosis not present

## 2015-11-19 NOTE — Telephone Encounter (Signed)
Home aide OK;thanks Exercise will be directed by Dr Marlou Sa, Ortho and Dr Ellene Route, NS because of cervical spine issues

## 2015-11-19 NOTE — Telephone Encounter (Signed)
Called Mya back. Gave verbal for bathing and safety. Also gave MD response to exercise and reasoning behind response.

## 2015-11-19 NOTE — Telephone Encounter (Signed)
Received another call from [facility] to approve care for bathing and safety.  Patient states she was told not to exercise.  Nurse would like to confirm this too.  Mya: [phone]

## 2015-11-20 DIAGNOSIS — M47817 Spondylosis without myelopathy or radiculopathy, lumbosacral region: Secondary | ICD-10-CM | POA: Diagnosis not present

## 2015-11-20 DIAGNOSIS — Z9181 History of falling: Secondary | ICD-10-CM | POA: Diagnosis not present

## 2015-11-21 DIAGNOSIS — M47817 Spondylosis without myelopathy or radiculopathy, lumbosacral region: Secondary | ICD-10-CM | POA: Diagnosis not present

## 2015-11-21 DIAGNOSIS — Z9181 History of falling: Secondary | ICD-10-CM | POA: Diagnosis not present

## 2015-11-23 DIAGNOSIS — Z9181 History of falling: Secondary | ICD-10-CM | POA: Diagnosis not present

## 2015-11-23 DIAGNOSIS — M47817 Spondylosis without myelopathy or radiculopathy, lumbosacral region: Secondary | ICD-10-CM | POA: Diagnosis not present

## 2015-11-26 DIAGNOSIS — M47817 Spondylosis without myelopathy or radiculopathy, lumbosacral region: Secondary | ICD-10-CM | POA: Diagnosis not present

## 2015-11-26 DIAGNOSIS — Z9181 History of falling: Secondary | ICD-10-CM | POA: Diagnosis not present

## 2015-11-27 ENCOUNTER — Telehealth: Payer: Self-pay | Admitting: Internal Medicine

## 2015-11-27 DIAGNOSIS — Z9181 History of falling: Secondary | ICD-10-CM | POA: Diagnosis not present

## 2015-11-27 DIAGNOSIS — M47817 Spondylosis without myelopathy or radiculopathy, lumbosacral region: Secondary | ICD-10-CM | POA: Diagnosis not present

## 2015-11-27 NOTE — Telephone Encounter (Signed)
Mya [phone] bayda  She is need verbal orders to move discharge from Week of dec to Dec 12

## 2015-11-28 ENCOUNTER — Telehealth: Payer: Self-pay | Admitting: Internal Medicine

## 2015-11-28 DIAGNOSIS — Z23 Encounter for immunization: Secondary | ICD-10-CM | POA: Diagnosis not present

## 2015-11-28 DIAGNOSIS — M545 Low back pain: Secondary | ICD-10-CM | POA: Diagnosis not present

## 2015-11-28 NOTE — Telephone Encounter (Signed)
Need help with this one

## 2015-11-28 NOTE — Telephone Encounter (Signed)
Maya wants to check the discharge date for the week of 12/10/2015.

## 2015-11-28 NOTE — Telephone Encounter (Signed)
Intended to type she wanted to CHANGE the discharge date...  So sorry

## 2015-11-29 DIAGNOSIS — Z9181 History of falling: Secondary | ICD-10-CM | POA: Diagnosis not present

## 2015-11-29 DIAGNOSIS — M47817 Spondylosis without myelopathy or radiculopathy, lumbosacral region: Secondary | ICD-10-CM | POA: Diagnosis not present

## 2015-11-30 ENCOUNTER — Ambulatory Visit
Admission: RE | Admit: 2015-11-30 | Discharge: 2015-11-30 | Disposition: A | Discharge status: Home / Self Care | Payer: Medicare Other | Source: Ambulatory Visit | Attending: Orthopedic Surgery | Admitting: Orthopedic Surgery

## 2015-11-30 ENCOUNTER — Other Ambulatory Visit: Payer: Self-pay | Admitting: Orthopedic Surgery

## 2015-11-30 ENCOUNTER — Telehealth: Payer: Self-pay | Admitting: Internal Medicine

## 2015-11-30 DIAGNOSIS — M545 Low back pain: Secondary | ICD-10-CM

## 2015-11-30 DIAGNOSIS — M5126 Other intervertebral disc displacement, lumbar region: Secondary | ICD-10-CM | POA: Diagnosis not present

## 2015-11-30 NOTE — Telephone Encounter (Signed)
Requesting verbal order to move OT visit back one week.

## 2015-11-30 NOTE — Telephone Encounter (Signed)
Verbal okay given.  

## 2015-12-03 DIAGNOSIS — Z9181 History of falling: Secondary | ICD-10-CM | POA: Diagnosis not present

## 2015-12-03 DIAGNOSIS — M47817 Spondylosis without myelopathy or radiculopathy, lumbosacral region: Secondary | ICD-10-CM | POA: Diagnosis not present

## 2015-12-04 DIAGNOSIS — M47817 Spondylosis without myelopathy or radiculopathy, lumbosacral region: Secondary | ICD-10-CM | POA: Diagnosis not present

## 2015-12-04 DIAGNOSIS — Z9181 History of falling: Secondary | ICD-10-CM | POA: Diagnosis not present

## 2015-12-07 DIAGNOSIS — M47817 Spondylosis without myelopathy or radiculopathy, lumbosacral region: Secondary | ICD-10-CM | POA: Diagnosis not present

## 2015-12-07 DIAGNOSIS — Z9181 History of falling: Secondary | ICD-10-CM | POA: Diagnosis not present

## 2015-12-10 ENCOUNTER — Encounter: Payer: Self-pay | Admitting: Internal Medicine

## 2015-12-10 ENCOUNTER — Other Ambulatory Visit: Payer: Self-pay | Admitting: Emergency Medicine

## 2015-12-10 MED ORDER — LORAZEPAM 1 MG PO TABS: ORAL_TABLET | ORAL | Status: DC

## 2015-12-11 ENCOUNTER — Encounter: Payer: Self-pay | Admitting: Internal Medicine

## 2015-12-11 DIAGNOSIS — M545 Low back pain: Secondary | ICD-10-CM | POA: Diagnosis not present

## 2015-12-12 DIAGNOSIS — M47817 Spondylosis without myelopathy or radiculopathy, lumbosacral region: Secondary | ICD-10-CM | POA: Diagnosis not present

## 2015-12-12 DIAGNOSIS — Z7689 Persons encountering health services in other specified circumstances: Secondary | ICD-10-CM

## 2015-12-12 DIAGNOSIS — Z9181 History of falling: Secondary | ICD-10-CM | POA: Diagnosis not present

## 2015-12-14 ENCOUNTER — Encounter: Payer: Self-pay | Admitting: Internal Medicine

## 2015-12-15 NOTE — Telephone Encounter (Signed)
Lorazepam is 1/2 pill every 8-12 hours prn only.This is not a maintenance medication. This medication is among those which experts have documented to have a very  high risk of affecting  mental  alertness  & balance. This results in increased risk of falling with serious health or life threatening injury. This is very important because of your history of neck/spine issues!!! Such medication should be taken as INFREQUENTLY as possible and @  the LOWEST possible dose.It should not be taken with alcohol, sedatives  or other agents which have a similar  adverse risk potential. These risks are greater as we age as there is decreased ability of the liver and kidneys to metabolize and excrete the medication, resulting in   increased blood levels of the active ingredient.

## 2015-12-18 ENCOUNTER — Telehealth: Payer: Self-pay | Admitting: Internal Medicine

## 2015-12-18 NOTE — Telephone Encounter (Signed)
Pt wants Dr. Quay Burow to be her PCP but doesn't want to change in her chart till after he is gone.

## 2015-12-18 NOTE — Telephone Encounter (Signed)
Please note pt's email she wrote on 12/16. I read her Dr. Clayborn Heron note he written. She states she needs this medication because she has been taking for a very long time.  She does only take half a pill and not every day, but pharmacy won't fill it unless it reads whole pill.  Please let her know when she can pick this prescription up.

## 2015-12-19 ENCOUNTER — Other Ambulatory Visit: Payer: Self-pay | Admitting: Emergency Medicine

## 2015-12-19 MED ORDER — LORAZEPAM 1 MG PO TABS: ORAL_TABLET | ORAL | Status: DC

## 2015-12-19 NOTE — Telephone Encounter (Signed)
It can not be written as 1 pill as that is fraudulent More importantly this pill has associated high fall risk especially if you drink alcohol  With your neck and spine issues this pill should be taken as infrequently as possible These risks increase as we age

## 2015-12-19 NOTE — Telephone Encounter (Signed)
Called pharmacy to verify RX for Ativan per MDs response

## 2015-12-22 ENCOUNTER — Ambulatory Visit (INDEPENDENT_AMBULATORY_CARE_PROVIDER_SITE_OTHER): Payer: Medicare Other | Admitting: Medical

## 2015-12-22 ENCOUNTER — Encounter: Payer: Self-pay | Admitting: Medical

## 2015-12-22 VITALS — BP 155/90 | Temp 98.0°F | Ht 60.0 in | Wt 166.0 lb

## 2015-12-22 DIAGNOSIS — R5383 Other fatigue: Secondary | ICD-10-CM | POA: Diagnosis not present

## 2015-12-22 DIAGNOSIS — L089 Local infection of the skin and subcutaneous tissue, unspecified: Secondary | ICD-10-CM

## 2015-12-22 DIAGNOSIS — I10 Essential (primary) hypertension: Secondary | ICD-10-CM

## 2015-12-22 MED ORDER — DOXYCYCLINE HYCLATE 100 MG PO TABS: 100.0000 mg | ORAL_TABLET | Freq: Two times a day (BID) | ORAL | Status: DC

## 2015-12-22 NOTE — Progress Notes (Signed)
Pre visit review using our clinic review tool, if applicable. No additional management support is needed unless otherwise documented below in the visit note. 
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
## 2015-12-22 NOTE — Patient Instructions (Addendum)
You  do appear to have skin infection. Will rx doxycyline antibiotic. If are of redness expands then be seen quickly by ED but expect area will improve quickly.  Your bp is little high today. I would check at home. If your bp is not less than 140/90 then recommend notifying your pcp on Tuesday for bp med adjustment.  I any neuro signs or symptoms then ED evaluation.  Fatigue may be related to infection. If this perists may need labs.  Follow up in 5 days.

## 2015-12-24 ENCOUNTER — Encounter: Payer: Self-pay | Admitting: Medical

## 2015-12-28 ENCOUNTER — Encounter: Payer: Self-pay | Admitting: Internal Medicine

## 2015-12-28 ENCOUNTER — Ambulatory Visit (INDEPENDENT_AMBULATORY_CARE_PROVIDER_SITE_OTHER): Payer: Medicare Other | Admitting: Internal Medicine

## 2015-12-28 VITALS — BP 128/82 | HR 88 | Temp 97.8°F | Ht 60.0 in | Wt 165.0 lb

## 2015-12-28 DIAGNOSIS — L03119 Cellulitis of unspecified part of limb: Secondary | ICD-10-CM

## 2015-12-28 DIAGNOSIS — I10 Essential (primary) hypertension: Secondary | ICD-10-CM | POA: Diagnosis not present

## 2015-12-28 DIAGNOSIS — R739 Hyperglycemia, unspecified: Secondary | ICD-10-CM | POA: Diagnosis not present
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
## 2015-12-28 NOTE — Assessment & Plan Note (Signed)
stable overall by history and exam, recent data reviewed with pt, and pt to continue medical treatment as before,  to f/u any worsening symptoms or concerns BP Readings from Last 3 Encounters:  12/28/15 128/82  12/22/15 155/90  11/12/15 137/66

## 2015-12-28 NOTE — Patient Instructions (Signed)
OK to finish your antibiotic  Please call if any further worsening redness, swelling, opening wounds or drainage or fever  Please continue all other medications as before, and refills have been done if requested.  Please have the pharmacy call with any other refills you may need.  Please keep your appointments with your specialists as you may have planned  Please return in 3 months, or sooner if needed, to Dr Quay Burow

## 2015-12-28 NOTE — Assessment & Plan Note (Signed)
Lab Results  Component Value Date   HGBA1C 5.2 10/30/2015   stable overall by history and exam, recent data reviewed with pt, and pt to continue medical treatment as before,  to f/u any worsening symptoms or concerns

## 2015-12-28 NOTE — Progress Notes (Signed)
Pre visit review using our clinic review tool, if applicable. No additional management support is needed unless otherwise documented below in the visit note. 

## 2015-12-28 NOTE — Assessment & Plan Note (Signed)
Mild, now resolved, ok to finish antibx but healing well and pt reassured. No need for MRI or other imaging at this time.

## 2015-12-31 ENCOUNTER — Encounter: Payer: Self-pay | Admitting: Pulmonary Disease

## 2016-01-01 ENCOUNTER — Encounter: Payer: Self-pay | Admitting: Internal Medicine

## 2016-01-01 MED ORDER — PREDNISONE 10 MG PO TABS: 10.0000 mg | ORAL_TABLET | Freq: Every day | ORAL | Status: DC

## 2016-01-01 NOTE — Telephone Encounter (Signed)
My Chart Message:  Patient requesting refill on Prednisone.  Patient 's next OV is 02/14/16. Ok to refill?

## 2016-01-01 NOTE — Telephone Encounter (Signed)
Will have my nurse send in script for prednisone.

## 2016-01-01 NOTE — Telephone Encounter (Signed)
Pt calling to check on status of refill was it sent in pharm is telling her they don't have anything.Lisa Larson

## 2016-01-01 NOTE — Telephone Encounter (Signed)
Pt says that she in on last pill and needs this called in asap.Hillery Hunter

## 2016-01-03 MED ORDER — PREDNISONE 10 MG PO TABS: 10.0000 mg | ORAL_TABLET | Freq: Every day | ORAL | Status: DC

## 2016-01-15 ENCOUNTER — Other Ambulatory Visit: Payer: Medicare Other

## 2016-01-15 ENCOUNTER — Ambulatory Visit (INDEPENDENT_AMBULATORY_CARE_PROVIDER_SITE_OTHER): Payer: Medicare Other | Admitting: Internal Medicine

## 2016-01-15 ENCOUNTER — Ambulatory Visit: Payer: Medicare Other | Admitting: Internal Medicine

## 2016-01-15 ENCOUNTER — Encounter: Payer: Self-pay | Admitting: Internal Medicine

## 2016-01-15 VITALS — BP 130/78 | HR 87 | Temp 98.6°F | Resp 20 | Ht 60.0 in | Wt 163.5 lb

## 2016-01-15 DIAGNOSIS — R300 Dysuria: Secondary | ICD-10-CM | POA: Diagnosis not present

## 2016-01-15 DIAGNOSIS — R739 Hyperglycemia, unspecified: Secondary | ICD-10-CM

## 2016-01-15 DIAGNOSIS — I10 Essential (primary) hypertension: Secondary | ICD-10-CM | POA: Diagnosis not present

## 2016-01-15 LAB — POCT URINALYSIS DIPSTICK
Bilirubin, UA: NEGATIVE
Glucose, UA: NEGATIVE
Ketones, UA: NEGATIVE
Nitrite, UA: NEGATIVE
Spec Grav, UA: 1.02
Urobilinogen, UA: NEGATIVE
pH, UA: 6

## 2016-01-15 MED ORDER — CEPHALEXIN 500 MG PO CAPS: 500.0000 mg | ORAL_CAPSULE | Freq: Four times a day (QID) | ORAL | Status: DC

## 2016-01-15 NOTE — Assessment & Plan Note (Signed)
Lab Results  Component Value Date   HGBA1C 5.2 10/30/2015   stable overall by history and exam, recent data reviewed with pt, and pt to continue medical treatment as before,  to f/u any worsening symptoms or concerns, pt to call for onset polys with infection

## 2016-01-15 NOTE — Assessment & Plan Note (Signed)
stable overall by history and exam, recent data reviewed with pt, and pt to continue medical treatment as before,  to f/u any worsening symptoms or concerns BP Readings from Last 3 Encounters:  01/15/16 130/78  12/28/15 128/82  12/22/15 155/90
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
## 2016-01-15 NOTE — Progress Notes (Signed)
Pre visit review using our clinic review tool, if applicable. No additional management support is needed unless otherwise documented below in the visit note. 

## 2016-01-15 NOTE — Patient Instructions (Signed)
Please take all new medication as prescribed - the antibiotic  Your specimen will be sent for culture  Please continue all other medications as before, and refills have been done if requested.  Please have the pharmacy call with any other refills you may need.  Please keep your appointments with your specialists as you may have planned   

## 2016-01-15 NOTE — Assessment & Plan Note (Addendum)
Mild to mod, likely uti, udip reviewed, for urine studies/cx, for antibx course,  to f/u any worsening symptoms or concerns

## 2016-01-17 LAB — URINE CULTURE: Colony Count: 50000

## 2016-01-21 ENCOUNTER — Encounter: Payer: Self-pay | Admitting: Internal Medicine

## 2016-01-23 ENCOUNTER — Encounter: Payer: Self-pay | Admitting: Internal Medicine

## 2016-01-30 ENCOUNTER — Other Ambulatory Visit (HOSPITAL_COMMUNITY): Payer: Self-pay | Admitting: Orthopedic Surgery

## 2016-01-30 DIAGNOSIS — M545 Low back pain: Secondary | ICD-10-CM | POA: Diagnosis not present

## 2016-01-30 DIAGNOSIS — M25562 Pain in left knee: Secondary | ICD-10-CM | POA: Diagnosis not present

## 2016-01-30 DIAGNOSIS — M7989 Other specified soft tissue disorders: Secondary | ICD-10-CM

## 2016-01-30 DIAGNOSIS — M79662 Pain in left lower leg: Secondary | ICD-10-CM

## 2016-01-31 ENCOUNTER — Other Ambulatory Visit: Payer: Self-pay | Admitting: Orthopedic Surgery

## 2016-01-31 ENCOUNTER — Ambulatory Visit (HOSPITAL_COMMUNITY)
Admission: RE | Admit: 2016-01-31 | Discharge: 2016-01-31 | Disposition: A | Discharge status: Home / Self Care | Payer: Medicare Other | Source: Ambulatory Visit | Attending: Internal Medicine | Admitting: Internal Medicine

## 2016-01-31 DIAGNOSIS — M545 Low back pain, unspecified: Secondary | ICD-10-CM

## 2016-01-31 DIAGNOSIS — M79605 Pain in left leg: Secondary | ICD-10-CM | POA: Diagnosis not present

## 2016-01-31 DIAGNOSIS — M7989 Other specified soft tissue disorders: Secondary | ICD-10-CM | POA: Insufficient documentation

## 2016-01-31 DIAGNOSIS — M79662 Pain in left lower leg: Secondary | ICD-10-CM

## 2016-01-31 DIAGNOSIS — G8929 Other chronic pain: Secondary | ICD-10-CM

## 2016-01-31 NOTE — Progress Notes (Signed)
*  Preliminary Results* Left lower extremity venous duplex completed. Left lower extremity is negative for deep vein thrombosis. There is no evidence of left Baker's cyst.  01/31/2016 10:50 AM  Maudry Mayhew, RVT, RDCS, RDMS

## 2016-02-01 ENCOUNTER — Ambulatory Visit: Payer: Self-pay | Admitting: Internal Medicine

## 2016-02-07 ENCOUNTER — Encounter: Payer: Self-pay | Admitting: Acute Care

## 2016-02-07 ENCOUNTER — Encounter: Payer: Self-pay | Admitting: Internal Medicine

## 2016-02-07 ENCOUNTER — Ambulatory Visit (INDEPENDENT_AMBULATORY_CARE_PROVIDER_SITE_OTHER): Payer: Medicare Other | Admitting: Acute Care

## 2016-02-07 VITALS — BP 122/74 | HR 93 | Ht 60.0 in | Wt 169.2 lb

## 2016-02-07 DIAGNOSIS — J302 Other seasonal allergic rhinitis: Secondary | ICD-10-CM

## 2016-02-07 DIAGNOSIS — J45998 Other asthma: Secondary | ICD-10-CM | POA: Diagnosis not present

## 2016-02-07 MED ORDER — FLUCONAZOLE 150 MG PO TABS: 150.0000 mg | ORAL_TABLET | Freq: Every day | ORAL | Status: DC

## 2016-02-07 MED ORDER — BENZONATATE 200 MG PO CAPS: 200.0000 mg | ORAL_CAPSULE | Freq: Three times a day (TID) | ORAL | Status: DC | PRN

## 2016-02-07 MED ORDER — DOXYCYCLINE HYCLATE 100 MG PO TABS: 100.0000 mg | ORAL_TABLET | Freq: Two times a day (BID) | ORAL | Status: DC
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
## 2016-02-07 NOTE — Assessment & Plan Note (Signed)
Continue Flonase  For maintenence

## 2016-02-07 NOTE — Patient Instructions (Addendum)
It is nice to meet you today. I am sorry you are not feeling well. We will send in a prescription for Doxycycline 100 mg twice daily for 7 days. Be careful out in the sun, use sun block while on the doxycycline. We will send a prescription for benzonatate 200 mg tablets. Take as needed for cough up to 3 times daily. Do not drive if sleepy or drowsy. Diflucan 150 mg , dispense 1 tablet for use with antibiotic to prevent yeast infection. Follow up with Dr. Halford Chessman as already scheduled 02/14/16 at 10:30am. Please contact office for sooner follow up if symptoms do not improve or worsen or seek emergency care.

## 2016-02-07 NOTE — Progress Notes (Signed)
Reviewed and agree with assessment/plan. 

## 2016-02-07 NOTE — Assessment & Plan Note (Addendum)
Flare with suspected bronchitis, no fever but greenish yellow secretions and cough.  Recent treatment with Cephalexin for UTI. No wheezing, patient is on chronic steroids for Eosinophilic Pneumonia  Plan: We will send in a prescription for Doxycycline 100 mg twice daily for 7 days. Be careful out in the sun, use sun block while on the doxycycline. We will send a prescription for benzonatate 200 mg tablets. Take as needed for cough up to 3 times daily. Do not drive if sleepy or drowsy. Diflucan 150 mg , dispense 1 tablet for use with antibiotic to prevent yeast infection. Follow up with Dr. Halford Chessman as already scheduled 02/14/16 at 10:30am. Please contact office for sooner follow up if symptoms do not improve or worsen or seek emergency care.

## 2016-02-08 ENCOUNTER — Inpatient Hospital Stay: Admission: RE | Admit: 2016-02-08 | Payer: Medicare Other | Source: Ambulatory Visit

## 2016-02-08 ENCOUNTER — Encounter: Payer: Self-pay | Admitting: Acute Care

## 2016-02-12 ENCOUNTER — Ambulatory Visit
Admission: RE | Admit: 2016-02-12 | Discharge: 2016-02-12 | Disposition: A | Discharge status: Home / Self Care | Payer: Medicare Other | Source: Ambulatory Visit | Attending: Orthopedic Surgery | Admitting: Orthopedic Surgery

## 2016-02-12 DIAGNOSIS — M79605 Pain in left leg: Secondary | ICD-10-CM

## 2016-02-12 DIAGNOSIS — G8929 Other chronic pain: Secondary | ICD-10-CM

## 2016-02-12 DIAGNOSIS — M545 Low back pain, unspecified: Secondary | ICD-10-CM

## 2016-02-12 MED ORDER — METHYLPREDNISOLONE ACETATE 40 MG/ML INJ SUSP (RADIOLOG
120.0000 mg | Freq: Once | INTRAMUSCULAR | Status: AC
  Administered 2016-02-12: 120 mg via EPIDURAL

## 2016-02-12 MED ORDER — IOHEXOL 180 MG/ML  SOLN
1.0000 mL | Freq: Once | INTRAMUSCULAR | Status: AC | PRN
  Administered 2016-02-12: 1 mL via EPIDURAL

## 2016-02-12 NOTE — Discharge Instructions (Signed)

## 2016-02-13 ENCOUNTER — Encounter: Payer: Self-pay | Admitting: Pulmonary Disease

## 2016-02-14 ENCOUNTER — Ambulatory Visit (INDEPENDENT_AMBULATORY_CARE_PROVIDER_SITE_OTHER)
Admission: RE | Admit: 2016-02-14 | Discharge: 2016-02-14 | Disposition: A | Discharge status: Home / Self Care | Payer: Medicare Other | Source: Ambulatory Visit | Attending: Pulmonary Disease | Admitting: Pulmonary Disease

## 2016-02-14 ENCOUNTER — Other Ambulatory Visit (INDEPENDENT_AMBULATORY_CARE_PROVIDER_SITE_OTHER): Payer: Medicare Other

## 2016-02-14 ENCOUNTER — Ambulatory Visit (INDEPENDENT_AMBULATORY_CARE_PROVIDER_SITE_OTHER): Payer: Medicare Other | Admitting: Pulmonary Disease

## 2016-02-14 ENCOUNTER — Encounter: Payer: Self-pay | Admitting: Pulmonary Disease

## 2016-02-14 VITALS — BP 130/84 | HR 73 | Ht 60.0 in | Wt 165.0 lb

## 2016-02-14 DIAGNOSIS — J189 Pneumonia, unspecified organism: Secondary | ICD-10-CM

## 2016-02-14 DIAGNOSIS — J8281 Chronic eosinophilic pneumonia: Secondary | ICD-10-CM

## 2016-02-14 DIAGNOSIS — J45998 Other asthma: Secondary | ICD-10-CM

## 2016-02-14 DIAGNOSIS — R05 Cough: Secondary | ICD-10-CM

## 2016-02-14 DIAGNOSIS — R059 Cough, unspecified: Secondary | ICD-10-CM

## 2016-02-14 DIAGNOSIS — J82 Pulmonary eosinophilia, not elsewhere classified: Secondary | ICD-10-CM | POA: Diagnosis not present

## 2016-02-14 LAB — CBC WITH DIFFERENTIAL/PLATELET
Basophils Absolute: 0 10*3/uL (ref 0.0–0.1)
Basophils Relative: 0 % (ref 0.0–3.0)
Eosinophils Absolute: 0 10*3/uL (ref 0.0–0.7)
Eosinophils Relative: 0 % (ref 0.0–5.0)
HCT: 38.4 % (ref 36.0–46.0)
Hemoglobin: 13 g/dL (ref 12.0–15.0)
Lymphocytes Relative: 7.6 % — ABNORMAL LOW (ref 12.0–46.0)
Lymphs Abs: 0.8 10*3/uL (ref 0.7–4.0)
MCHC: 33.9 g/dL (ref 30.0–36.0)
MCV: 92.7 fl (ref 78.0–100.0)
Monocytes Absolute: 0.7 10*3/uL (ref 0.1–1.0)
Monocytes Relative: 6.7 % (ref 3.0–12.0)
Neutro Abs: 9.5 10*3/uL — ABNORMAL HIGH (ref 1.4–7.7)
Neutrophils Relative %: 85.7 % — ABNORMAL HIGH (ref 43.0–77.0)
Platelets: 246 10*3/uL (ref 150.0–400.0)
RBC: 4.14 Mil/uL (ref 3.87–5.11)
RDW: 15.6 % — ABNORMAL HIGH (ref 11.5–15.5)
WBC: 11.1 10*3/uL — ABNORMAL HIGH (ref 4.0–10.5)

## 2016-02-14 LAB — COMPREHENSIVE METABOLIC PANEL
ALT: 20 U/L (ref 0–35)
AST: 22 U/L (ref 0–37)
Albumin: 4.2 g/dL (ref 3.5–5.2)
Alkaline Phosphatase: 65 U/L (ref 39–117)
BUN: 20 mg/dL (ref 6–23)
CO2: 30 mEq/L (ref 19–32)
Calcium: 9.9 mg/dL (ref 8.4–10.5)
Chloride: 100 mEq/L (ref 96–112)
Creatinine, Ser: 0.92 mg/dL (ref 0.40–1.20)
GFR: 63.63 mL/min (ref >60.00)
Glucose, Bld: 112 mg/dL — ABNORMAL HIGH (ref 70–99)
Potassium: 4.4 mEq/L (ref 3.5–5.1)
Sodium: 138 mEq/L (ref 135–145)
Total Bilirubin: 0.6 mg/dL (ref 0.2–1.2)
Total Protein: 7.2 g/dL (ref 6.0–8.3)

## 2016-02-14 NOTE — Patient Instructions (Signed)
Lab tests today  Prednisone 10 mg pills >> 3 pills daily for 2 days, 2 pills daily for 2 days, 1.5 pills daily for 2 days, then 1 pill daily  Follow up in 4 weeks with Chest xray >> can be with Dr. Halford Chessman or Nurse Practitioner

## 2016-02-14 NOTE — Progress Notes (Signed)
Current Outpatient Prescriptions on File Prior to Visit  Medication Sig  . albuterol (PROAIR HFA) 108 (90 BASE) MCG/ACT inhaler Inhale 2 puffs into the lungs every 6 (six) hours as needed. (Patient taking differently: Inhale 2 puffs into the lungs every 6 (six) hours as needed for wheezing or shortness of breath. )  . benzonatate (TESSALON) 200 MG capsule Take 1 capsule (200 mg total) by mouth 3 (three) times daily as needed for cough.  . calcium carbonate (OS-CAL) 600 MG TABS tablet Take 600 mg by mouth daily.   . Cetirizine HCl 10 MG CAPS Take 1 capsule (10 mg total) by mouth daily.  . Cholecalciferol (VITAMIN D3) 2000 UNITS capsule Take 2,000 Units by mouth daily.  Marland Kitchen doxycycline (VIBRA-TABS) 100 MG tablet Take 1 tablet (100 mg total) by mouth 2 (two) times daily.  . DULoxetine (CYMBALTA) 30 MG capsule TAKE ONE CAPSULE BY MOUTH DIALY  . fluconazole (DIFLUCAN) 150 MG tablet Take 1 tablet (150 mg total) by mouth daily.  . fluticasone (FLONASE) 50 MCG/ACT nasal spray Place 2 sprays into both nostrils daily.  Marland Kitchen gabapentin (NEURONTIN) 100 MG capsule Take 1 capsule (100 mg total) by mouth 3 (three) times daily.  Marland Kitchen HYDROcodone-acetaminophen (NORCO/VICODIN) 5-325 MG tablet Take 1-2 tablets by mouth every 6 (six) hours as needed for moderate pain.  Marland Kitchen LORazepam (ATIVAN) 1 MG tablet TAKE 1/2 TABLET BY MOUTH EVERY 8-12 HOURS AS NEEDED(NOT with alcohol)  . losartan (COZAAR) 100 MG tablet Take 1 tablet (100 mg total) by mouth daily.  . mometasone (ASMANEX 60 METERED DOSES) 220 MCG/INH inhaler Inhale 2 puffs into the lungs daily.  Marland Kitchen omeprazole (PRILOSEC) 20 MG capsule TAKE 1 CAPSULE BY MOUTH EVERY DAY (Patient taking differently: TAKE 1 CAPSULE BY MOUTH EVERY DAY as needed for acid reflex)  . ondansetron (ZOFRAN) 4 MG tablet Take 1 tablet (4 mg total) by mouth every 8 (eight) hours as needed for nausea or vomiting. for nausea  . predniSONE (DELTASONE) 10 MG tablet Take 1 tablet (10 mg total) by mouth daily with  breakfast.  . Probiotic Product ([place]) Take 1 capsule by mouth daily.  Marland Kitchen thiamine (VITAMIN B-1) 100 MG tablet Take 100 mg by mouth daily.    No current facility-administered medications on file prior to visit.     Chief Complaint  Patient presents with  . Follow-up    CXR today. Pt c/o wheezing, mucus production, SOB and cough with pale yellow mucus. Pt has been on abx since late Dec 2016- given Doxy 100mg  02/07/16 by Eric Form, has 2 tabs left. Pt states that she feels she is not getting any better. Pt taking Pred 10mg  qd.      Tests CBC 01/14/11>>39% eosinophils 01/14/11>> HIV negative, ACE 39, RF negative, ANA 1:40, ANCA negative  CT chest 01/16/11>>Multifocal b/l ASD with GGO BAL 01/21/11>>48% Eosinophils  IgE 02/25/11>>282 CT chest 07/22/11>>resolution of ASD CT chest 10/03/11>>recurrence of nodular infiltrate Rt upper lobe PFT 11/04/12>>FEV1 1.77 (87%), FEV1% 68, TLC 4.87 (101%), DLCO 69%, +BD from FEF 25-75%.  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  Vital Signs BP 130/84 mmHg  Pulse 73  Ht 5' (1.524 m)  Wt 165 lb (74.844 kg)  BMI 32.22 kg/m2  SpO2 95%  History of Present Illness Lisa Larson is a 75 y.o. female former smoker with eosinophilic pneumonia, asthma.  She was seen by Judson Roch  Elie Confer, NP on 02/07/16.  She was tx for acute bronchitis with doxycycline.  She has been on several course of Abx recently for Rt hand infection.  She is still having wheeze, and cough with thick white sputum.  She denies chest pain, fever, abdominal pain, or skin rash.  She remains on 10 mg prednisone daily.  She has been using asmanex.  She is using proair more frequently.  Physical Exam  General - No distress ENT - No sinus tenderness, no oral exudate, no LAN Cardiac - s1s2 regular, no murmur Chest - coarse breath sounds b/l, no wheezing Back - No  focal tenderness Abd - Soft, non-tender Ext - No edema Neuro - Normal strength Skin - No rashes Psych - normal mood, and behavior   Dg Chest 2 View  02/14/2016  CLINICAL DATA:  Two week history of cough and congestion EXAM: CHEST  2 VIEW COMPARISON:  April 24, 2015 FINDINGS: Chronic right middle lobe scarring is stable. There is mild atelectatic change in the left base. Elsewhere lungs are clear. Heart size and pulmonary vascularity are normal. There is atherosclerotic calcification in the aorta. No adenopathy. There is degenerative change in the thoracic spine. IMPRESSION: Mild atelectasis left base. This appearance could represent the earliest changes of pneumonia in the left base. There is scarring in the right middle lobe which is stable. Lungs elsewhere clear. No change in cardiac silhouette. Electronically Signed   By: Lowella Grip III M.D.   On: 02/14/2016 10:55   Discussion: She had recent flare of asthma.  Her CXR is suggestive of pneumonia which might be resolving.  Her sputum color has improved, but she is still having wheeze.  Assessment/Plan  Acute asthma exacerbation 2nd to bronchitis/pneumonia Plan: - defer additional Abx for now - give prednisone taper - continue asmanex and prn proair  Hx of eosinophilic pneumonia. Plan: - check CBC with diff, CMET - taper back to baseline dose of 10 mg prednisone as tolerated  Allergic rhinitis. Plan: - continue flonase   Patient Instructions  Lab tests today  Prednisone 10 mg pills >> 3 pills daily for 2 days, 2 pills daily for 2 days, 1.5 pills daily for 2 days, then 1 pill daily  Follow up in 4 weeks with Chest xray >> can be with Dr. Halford Chessman or Nurse Practitioner     [name], MD [facility] Pager:  [phone]

## 2016-02-15 ENCOUNTER — Encounter: Payer: Self-pay | Admitting: Internal Medicine

## 2016-02-16 ENCOUNTER — Encounter: Payer: Self-pay | Admitting: Pulmonary Disease

## 2016-02-16 NOTE — Telephone Encounter (Signed)
Ok to refill - she has an appt with me in march.

## 2016-02-18 ENCOUNTER — Other Ambulatory Visit: Payer: Self-pay | Admitting: Internal Medicine

## 2016-02-18 ENCOUNTER — Encounter: Payer: Self-pay | Admitting: Neurology

## 2016-02-18 NOTE — Telephone Encounter (Signed)
Please advise, thanks.

## 2016-02-18 NOTE — Telephone Encounter (Signed)
Dr Halford Chessman, please advise on lab results thanks!

## 2016-02-29 ENCOUNTER — Encounter: Payer: Self-pay | Admitting: Neurology

## 2016-03-17 ENCOUNTER — Ambulatory Visit (INDEPENDENT_AMBULATORY_CARE_PROVIDER_SITE_OTHER)
Admission: RE | Admit: 2016-03-17 | Discharge: 2016-03-17 | Disposition: A | Discharge status: Home / Self Care | Payer: Medicare Other | Source: Ambulatory Visit | Attending: Adult Health | Admitting: Adult Health

## 2016-03-17 ENCOUNTER — Encounter: Payer: Self-pay | Admitting: Adult Health

## 2016-03-17 ENCOUNTER — Ambulatory Visit (INDEPENDENT_AMBULATORY_CARE_PROVIDER_SITE_OTHER): Payer: Medicare Other | Admitting: Adult Health

## 2016-03-17 VITALS — BP 128/84 | HR 80 | Temp 98.7°F | Ht 60.0 in | Wt 170.0 lb

## 2016-03-17 DIAGNOSIS — J189 Pneumonia, unspecified organism: Secondary | ICD-10-CM | POA: Diagnosis not present

## 2016-03-17 DIAGNOSIS — J45998 Other asthma: Secondary | ICD-10-CM

## 2016-03-17 DIAGNOSIS — J8289 Other pulmonary eosinophilia, not elsewhere classified: Secondary | ICD-10-CM

## 2016-03-17 DIAGNOSIS — J82 Pulmonary eosinophilia, not elsewhere classified: Secondary | ICD-10-CM

## 2016-03-17 MED ORDER — PREDNISONE 10 MG PO TABS: 10.0000 mg | ORAL_TABLET | Freq: Every day | ORAL | Status: DC

## 2016-03-17 MED ORDER — LOSARTAN POTASSIUM 100 MG PO TABS: 100.0000 mg | ORAL_TABLET | Freq: Every day | ORAL | Status: DC

## 2016-03-17 NOTE — Addendum Note (Signed)
Addended by: Osa Craver on: 03/17/2016 11:49 AM   Modules accepted: Orders
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
## 2016-03-17 NOTE — Assessment & Plan Note (Signed)
Recent flare now resolving  Cont on current regimen  follow up 6 weeks and As needed

## 2016-03-17 NOTE — Assessment & Plan Note (Addendum)
Compensated without flare  CBC w/ no eos.  Cont on pred 10mg  daily  cxr with lingering atx in RML/lingula ? Scarring vs PNA  Check cxr on return if persistent consider CT chest

## 2016-03-17 NOTE — Patient Instructions (Signed)
Continue on Prednisone 10mg  daily  Follow up with Dr. Halford Chessman  In 6 weeks with Chest xray .  Please contact office for sooner follow up if symptoms do not improve or worsen or seek emergency care

## 2016-03-24 NOTE — Progress Notes (Signed)
Reviewed and agree with assessment/plan. 

## 2016-03-25 ENCOUNTER — Encounter: Payer: Self-pay | Admitting: Internal Medicine

## 2016-03-26 ENCOUNTER — Ambulatory Visit (INDEPENDENT_AMBULATORY_CARE_PROVIDER_SITE_OTHER): Payer: Medicare Other | Admitting: Internal Medicine

## 2016-03-26 ENCOUNTER — Encounter: Payer: Self-pay | Admitting: Internal Medicine

## 2016-03-26 VITALS — BP 154/90 | HR 87 | Temp 97.9°F | Resp 16 | Wt 168.0 lb

## 2016-03-26 DIAGNOSIS — Z23 Encounter for immunization: Secondary | ICD-10-CM | POA: Diagnosis not present

## 2016-03-26 DIAGNOSIS — F329 Major depressive disorder, single episode, unspecified: Secondary | ICD-10-CM | POA: Diagnosis not present

## 2016-03-26 DIAGNOSIS — E162 Hypoglycemia, unspecified: Secondary | ICD-10-CM

## 2016-03-26 DIAGNOSIS — M1990 Unspecified osteoarthritis, unspecified site: Secondary | ICD-10-CM | POA: Diagnosis not present

## 2016-03-26 DIAGNOSIS — F411 Generalized anxiety disorder: Secondary | ICD-10-CM | POA: Diagnosis not present

## 2016-03-26 DIAGNOSIS — I10 Essential (primary) hypertension: Secondary | ICD-10-CM

## 2016-03-26 DIAGNOSIS — K219 Gastro-esophageal reflux disease without esophagitis: Secondary | ICD-10-CM

## 2016-03-26 DIAGNOSIS — K8020 Calculus of gallbladder without cholecystitis without obstruction: Secondary | ICD-10-CM | POA: Diagnosis not present

## 2016-03-26 DIAGNOSIS — G894 Chronic pain syndrome: Secondary | ICD-10-CM

## 2016-03-26 DIAGNOSIS — L97421 Non-pressure chronic ulcer of left heel and midfoot limited to breakdown of skin: Secondary | ICD-10-CM

## 2016-03-26 DIAGNOSIS — F32A Depression, unspecified: Secondary | ICD-10-CM

## 2016-03-26 LAB — GLUCOSE, POCT (MANUAL RESULT ENTRY): POC Glucose: 117 mg/dl — AB (ref 70–99)

## 2016-03-26 MED ORDER — RANITIDINE HCL 150 MG PO TABS: 150.0000 mg | ORAL_TABLET | Freq: Every day | ORAL | Status: DC

## 2016-03-26 NOTE — Assessment & Plan Note (Signed)
Not controlled Needs daily medication Concerned about omeprazole so we discontinue Start ranitidine 150 mg nightly - can increase to twice daily as needed Stressed not eating within three hours of laying down

## 2016-03-26 NOTE — Assessment & Plan Note (Signed)
Stressed avoid fatty, fried foods, but she has a hard time doing that Will refer to surgery
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
## 2016-03-26 NOTE — Assessment & Plan Note (Signed)
Fairly controlled Continue current dose of cymbalta Uses ativan as needed - recommended keeping this to a minimum

## 2016-03-26 NOTE — Progress Notes (Signed)
Pre visit review using our clinic review tool, if applicable. No additional management support is needed unless otherwise documented below in the visit note. 

## 2016-03-26 NOTE — Assessment & Plan Note (Signed)
Elevated here today, but typically well controlled  no change in medication today

## 2016-03-26 NOTE — Patient Instructions (Addendum)
   Pneumonia vaccine administered today.   Medications reviewed and updated.  Changes include starting zantac daly.    Your prescription(s) have been submitted to your pharmacy. Please take as directed and contact our office if you believe you are having problem(s) with the medication(s).   Please followup in 6 months

## 2016-03-26 NOTE — Assessment & Plan Note (Signed)
Whole body pain - neck, pain, knee OA, arm from prior fracture,etc Follows with ortho - they prescribe vicodin Recommend pain management - referred today

## 2016-03-26 NOTE — Assessment & Plan Note (Signed)
Controlled Continue current dose of cymbalta

## 2016-04-02 ENCOUNTER — Ambulatory Visit (INDEPENDENT_AMBULATORY_CARE_PROVIDER_SITE_OTHER): Payer: Medicare Other

## 2016-04-02 VITALS — BP 130/90 | Ht 60.0 in | Wt 170.2 lb

## 2016-04-02 DIAGNOSIS — Z0000 Encounter for general adult medical examination without abnormal findings: Secondary | ICD-10-CM
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
## 2016-04-02 NOTE — Patient Instructions (Addendum)
Lisa Larson , Thank you for taking time to come for your Medicare Wellness Visit. I appreciate your ongoing commitment to your health goals. Please review the following plan we discussed and let me know if I can assist you in the future.   May need hearing checked again; Will help with first hearing aid  Deaf [facility] Hard of Hearing [facility]  No reviews  [facility]  [place] [phone]  [phone]  Hearing check [address] [place], [place] [address]  [phone] (Home) [email or web address]     Will have mammogram and dexa scan at [place] scheduled   Will consider Zostavax;  Educated to check with insurance regarding coverage of Shingles vaccination on Part D or Part B and may have lower co-pay if provided on the Part D side   These are the goals we discussed: Goals    . patient     Looking forward to having less pain and doing more       This is a list of the screening recommended for you and due dates:  Health Maintenance  Topic Date Due  . Complete foot exam   04/21/72  . Eye exam for diabetics  04/21/72  . Shingles Vaccine  04/21/72  . Mammogram  03/08/72  . Hemoglobin A1C  04/28/2016  . Flu Shot  07/29/2016  . Colon Cancer Screening  06/29/2018  . Tetanus Vaccine  04/05/2023  . DEXA scan (bone density measurement)  Completed  . Pneumonia vaccines  Completed   Urinary Incontinence Urinary incontinence is the involuntary loss of urine from your bladder. CAUSES  There are many causes of urinary incontinence. They include:  Medicines.  Infections.  Prostatic enlargement, leading to overflow of urine from your bladder.  Surgery.  Neurological diseases.  Emotional factors. SIGNS AND SYMPTOMS Urinary Incontinence can be divided into four types:  Urge incontinence. Urge incontinence is the involuntary loss of urine before you have the opportunity to go to the bathroom. There is a sudden urge to void but not enough time to reach  a bathroom.  Stress incontinence. Stress incontinence is the sudden loss of urine with any activity that forces urine to pass. It is commonly caused by anatomical changes to the pelvis and sphincter areas of your body.  Overflow incontinence. Overflow incontinence is the loss of urine from an obstructed opening to your bladder. This results in a backup of urine and a resultant buildup of pressure within the bladder. When the pressure within the bladder exceeds the closing pressure of the sphincter, the urine overflows, which causes incontinence, similar to water overflowing a dam.  Total incontinence. Total incontinence is the loss of urine as a result of the inability to store urine within your bladder. DIAGNOSIS  Evaluating the cause of incontinence may require:  A thorough and complete medical and obstetric history.  A complete physical exam.  Laboratory tests such as a urine culture and sensitivities. When additional tests are indicated, they can include:  An ultrasound exam.  Kidney and bladder X-rays.  Cystoscopy. This is an exam of the bladder using a narrow scope.  Urodynamic testing to test the nerve function to the bladder and sphincter areas. TREATMENT  Treatment for urinary incontinence depends on the cause:  For urge incontinence caused by a bacterial infection, antibiotics will be prescribed. If the urge incontinence is related to medicines you take, your health care provider may have you change the medicine.  For stress incontinence, surgery to re-establish anatomical  support to the bladder or sphincter, or both, will often correct the condition.  For overflow incontinence caused by an enlarged prostate, an operation to open the channel through the enlarged prostate will allow the flow of urine out of the bladder. In women with fibroids, a hysterectomy may be recommended.  For total incontinence, surgery on your urinary sphincter may help. An artificial urinary  sphincter (an inflatable cuff placed around the urethra) may be required. In women who have developed a hole-like passage between their bladder and vagina (vesicovaginal fistula), surgery to close the fistula often is required. HOME CARE INSTRUCTIONS  Normal daily hygiene and the use of pads or adult diapers that are changed regularly will help prevent odors and skin damage.  Avoid caffeine. It can overstimulate your bladder.  Use the bathroom regularly. Try about every 2-3 hours to go to the bathroom, even if you do not feel the need to do so. Take time to empty your bladder completely. After urinating, wait a minute. Then try to urinate again.  For causes involving nerve dysfunction, keep a log of the medicines you take and a journal of the times you go to the bathroom. SEEK MEDICAL CARE IF:  You experience worsening of pain instead of improvement in pain after your procedure.  Your incontinence becomes worse instead of better. SEE IMMEDIATE MEDICAL CARE IF:  You experience fever or shaking chills.  You are unable to pass your urine.  You have redness spreading into your groin or down into your thighs. MAKE SURE YOU:   Understand these instructions.   Will watch your condition.  Will get help right away if you are not doing well or get worse.   This information is not intended to replace advice given to you by your health care provider. Make sure you discuss any questions you have with your health care provider.   Document Released: 01/22/2005 Document Revised: 01/05/2015 Document Reviewed: 05/24/2013 Elsevier Interactive Patient Education 2016 [place].   Bone Densitometry Bone densitometry is an imaging test that uses a special X-ray to measure the amount of calcium and other minerals in your bones (bone density). This test is also known as a bone mineral density test or dual-energy X-ray absorptiometry (DXA). The test can measure bone density at your hip and your  spine. It is similar to having a regular X-ray. You may have this test to:  Diagnose a condition that causes weak or thin bones (osteoporosis).  Predict your risk of a broken bone (fracture).  Determine how well osteoporosis treatment is working. LET [facility] CARE PROVIDER KNOW ABOUT:  Any allergies you have.  All medicines you are taking, including vitamins, herbs, eye drops, creams, and over-the-counter medicines.  Previous problems you or members of your family have had with the use of anesthetics.  Any blood disorders you have.  Previous surgeries you have had.  Medical conditions you have.  Possibility of pregnancy.  Any other medical test you had within the previous 14 days that used contrast material. RISKS AND COMPLICATIONS Generally, this is a safe procedure. However, problems can occur and may include the following:  This test exposes you to a very small amount of radiation.  The risks of radiation exposure may be greater to unborn children. BEFORE THE PROCEDURE  Do not take any calcium supplements for 24 hours before having the test. You can otherwise eat and drink what you usually do.  Take off all metal jewelry, eyeglasses, dental appliances, and any other  metal objects. PROCEDURE  You may lie on an exam table. There will be an X-ray generator below you and an imaging device above you.  Other devices, such as boxes or braces, may be used to position your body properly for the scan.  You will need to lie still while the machine slowly scans your body.  The images will show up on a computer monitor. AFTER THE PROCEDURE You may need more testing at a later time.   This information is not intended to replace advice given to you by your health care provider. Make sure you discuss any questions you have with your health care provider.   Document Released: 01/06/2005 Document Revised: 01/05/2015 Document Reviewed: 05/25/2014 Elsevier Interactive Patient  Education 2016 [place] in the Home  Falls can cause injuries. They can happen to people of all ages. There are many things you can do to make your home safe and to help prevent falls.  WHAT CAN I DO ON THE OUTSIDE OF MY HOME?  Regularly fix the edges of walkways and driveways and fix any cracks.  Remove anything that might make you trip as you walk through a door, such as a raised step or threshold.  Trim any bushes or trees on the path to your home.  Use bright outdoor lighting.  Clear any walking paths of anything that might make someone trip, such as rocks or tools.  Regularly check to see if handrails are loose or broken. Make sure that both sides of any steps have handrails.  Any raised decks and porches should have guardrails on the edges.  Have any leaves, snow, or ice cleared regularly.  Use sand or salt on walking paths during winter.  Clean up any spills in your garage right away. This includes oil or grease spills. WHAT CAN I DO IN THE BATHROOM?   Use night lights.  Install grab bars by the toilet and in the tub and shower. Do not use towel bars as grab bars.  Use non-skid mats or decals in the tub or shower.  If you need to sit down in the shower, use a plastic, non-slip stool.  Keep the floor dry. Clean up any water that spills on the floor as soon as it happens.  Remove soap buildup in the tub or shower regularly.  Attach bath mats securely with double-sided non-slip rug tape.  Do not have throw rugs and other things on the floor that can make you trip. WHAT CAN I DO IN THE BEDROOM?  Use night lights.  Make sure that you have a light by your bed that is easy to reach.  Do not use any sheets or blankets that are too big for your bed. They should not hang down onto the floor.  Have a firm chair that has side arms. You can use this for support while you get dressed.  Do not have throw rugs and other things on the floor that can  make you trip. WHAT CAN I DO IN THE KITCHEN?  Clean up any spills right away.  Avoid walking on wet floors.  Keep items that you use a lot in easy-to-reach places.  If you need to reach something above you, use a strong step stool that has a grab bar.  Keep electrical cords out of the way.  Do not use floor polish or wax that makes floors slippery. If you must use wax, use non-skid floor wax.  Do not have throw rugs  and other things on the floor that can make you trip. WHAT CAN I DO WITH MY STAIRS?  Do not leave any items on the stairs.  Make sure that there are handrails on both sides of the stairs and use them. Fix handrails that are broken or loose. Make sure that handrails are as long as the stairways.  Check any carpeting to make sure that it is firmly attached to the stairs. Fix any carpet that is loose or worn.  Avoid having throw rugs at the top or bottom of the stairs. If you do have throw rugs, attach them to the floor with carpet tape.  Make sure that you have a light switch at the top of the stairs and the bottom of the stairs. If you do not have them, ask someone to add them for you. WHAT ELSE CAN I DO TO HELP PREVENT FALLS?  Wear shoes that:  Do not have high heels.  Have rubber bottoms.  Are comfortable and fit you well.  Are closed at the toe. Do not wear sandals.  If you use a stepladder:  Make sure that it is fully opened. Do not climb a closed stepladder.  Make sure that both sides of the stepladder are locked into place.  Ask someone to hold it for you, if possible.  Clearly mark and make sure that you can see:  Any grab bars or handrails.  First and last steps.  Where the edge of each step is.  Use tools that help you move around (mobility aids) if they are needed. These include:  Canes.  Walkers.  Scooters.  Crutches.  Turn on the lights when you go into a dark area. Replace any light bulbs as soon as they burn out.  Set up your  furniture so you have a clear path. Avoid moving your furniture around.  If any of your floors are uneven, fix them.  If there are any pets around you, be aware of where they are.  Review your medicines with your doctor. Some medicines can make you feel dizzy. This can increase your chance of falling. Ask your doctor what other things that you can do to help prevent falls.   This information is not intended to replace advice given to you by your health care provider. Make sure you discuss any questions you have with your health care provider.   Document Released: 10/11/2009 Document Revised: 05/01/2015 Document Reviewed: 01/19/2015 Elsevier Interactive Patient Education 2016 [place] Maintenance, Female Adopting a healthy lifestyle and getting preventive care can go a long way to promote health and wellness. Talk with your health care provider about what schedule of regular examinations is right for you. This is a good chance for you to check in with your provider about disease prevention and staying healthy. In between checkups, there are plenty of things you can do on your own. Experts have done a lot of research about which lifestyle changes and preventive measures are most likely to keep you healthy. Ask your health care provider for more information. WEIGHT AND DIET  Eat a healthy diet  Be sure to include plenty of vegetables, fruits, low-fat dairy products, and lean protein.  Do not eat a lot of foods high in solid fats, added sugars, or salt.  Get regular exercise. This is one of the most important things you can do for your health.  Most adults should exercise for at least 150 minutes each week. The exercise should increase  your heart rate and make you sweat (moderate-intensity exercise).  Most adults should also do strengthening exercises at least twice a week. This is in addition to the moderate-intensity exercise.  Maintain a healthy weight  Body mass index  (BMI) is a measurement that can be used to identify possible weight problems. It estimates body fat based on height and weight. Your health care provider can help determine your BMI and help you achieve or maintain a healthy weight.  For females 11 years of age and older:   A BMI below 18.5 is considered underweight.  A BMI of 18.5 to 24.9 is normal.  A BMI of 25 to 29.9 is considered overweight.  A BMI of 30 and above is considered obese.  Watch levels of cholesterol and blood lipids  You should start having your blood tested for lipids and cholesterol at 73 years of age, then have this test every 5 years.  You may need to have your cholesterol levels checked more often if:  Your lipid or cholesterol levels are high.  You are older than 73 years of age.  You are at high risk for heart disease.  CANCER SCREENING   Lung Cancer  Lung cancer screening is recommended for adults 15-64 years old who are at high risk for lung cancer because of a history of smoking.  A yearly low-dose CT scan of the lungs is recommended for people who:  Currently smoke.  Have quit within the past 15 years.  Have at least a 30-pack-year history of smoking. A pack year is smoking an average of one pack of cigarettes a day for 1 year.  Yearly screening should continue until it has been 15 years since you quit.  Yearly screening should stop if you develop a health problem that would prevent you from having lung cancer treatment.  Breast Cancer  Practice breast self-awareness. This means understanding how your breasts normally appear and feel.  It also means doing regular breast self-exams. Let your health care provider know about any changes, no matter how small.  If you are in your 20s or 30s, you should have a clinical breast exam (CBE) by a health care provider every 1-3 years as part of a regular health exam.  If you are 63 or older, have a CBE every year. Also consider having a breast  X-ray (mammogram) every year.  If you have a family history of breast cancer, talk to your health care provider about genetic screening.  If you are at high risk for breast cancer, talk to your health care provider about having an MRI and a mammogram every year.  Breast cancer gene (BRCA) assessment is recommended for women who have family members with BRCA-related cancers. BRCA-related cancers include:  Breast.  Ovarian.  Tubal.  Peritoneal cancers.  Results of the assessment will determine the need for genetic counseling and BRCA1 and BRCA2 testing. Cervical Cancer Your health care provider may recommend that you be screened regularly for cancer of the pelvic organs (ovaries, uterus, and vagina). This screening involves a pelvic examination, including checking for microscopic changes to the surface of your cervix (Pap test). You may be encouraged to have this screening done every 3 years, beginning at age 50.  For women ages 90-65, health care providers may recommend pelvic exams and Pap testing every 3 years, or they may recommend the Pap and pelvic exam, combined with testing for human papilloma virus (HPV), every 5 years. Some types of HPV increase your risk  of cervical cancer. Testing for HPV may also be done on women of any age with unclear Pap test results.  Other health care providers may not recommend any screening for nonpregnant women who are considered low risk for pelvic cancer and who do not have symptoms. Ask your health care provider if a screening pelvic exam is right for you.  If you have had past treatment for cervical cancer or a condition that could lead to cancer, you need Pap tests and screening for cancer for at least 20 years after your treatment. If Pap tests have been discontinued, your risk factors (such as having a new sexual partner) need to be reassessed to determine if screening should resume. Some women have medical problems that increase the chance of  getting cervical cancer. In these cases, your health care provider may recommend more frequent screening and Pap tests. Colorectal Cancer  This type of cancer can be detected and often prevented.  Routine colorectal cancer screening usually begins at 73 years of age and continues through 73 years of age.  Your health care provider may recommend screening at an earlier age if you have risk factors for colon cancer.  Your health care provider may also recommend using home test kits to check for hidden blood in the stool.  A small camera at the end of a tube can be used to examine your colon directly (sigmoidoscopy or colonoscopy). This is done to check for the earliest forms of colorectal cancer.  Routine screening usually begins at age 73.  Direct examination of the colon should be repeated every 5-10 years through 73 years of age. However, you may need to be screened more often if early forms of precancerous polyps or small growths are found. Skin Cancer  Check your skin from head to toe regularly.  Tell your health care provider about any new moles or changes in moles, especially if there is a change in a mole's shape or color.  Also tell your health care provider if you have a mole that is larger than the size of a pencil eraser.  Always use sunscreen. Apply sunscreen liberally and repeatedly throughout the day.  Protect yourself by wearing long sleeves, pants, a wide-brimmed hat, and sunglasses whenever you are outside. HEART DISEASE, DIABETES, AND HIGH BLOOD PRESSURE   High blood pressure causes heart disease and increases the risk of stroke. High blood pressure is more likely to develop in:  People who have blood pressure in the high end of the normal range (130-139/85-89 mm Hg).  People who are overweight or obese.  People who are African American.  If you are 53-47 years of age, have your blood pressure checked every 3-5 years. If you are 14 years of age or older, have  your blood pressure checked every year. You should have your blood pressure measured twice--once when you are at a hospital or clinic, and once when you are not at a hospital or clinic. Record the average of the two measurements. To check your blood pressure when you are not at a hospital or clinic, you can use:  An automated blood pressure machine at a pharmacy.  A home blood pressure monitor.  If you are between 74 years and 91 years old, ask your health care provider if you should take aspirin to prevent strokes.  Have regular diabetes screenings. This involves taking a blood sample to check your fasting blood sugar level.  If you are at a normal weight and have a  low risk for diabetes, have this test once every three years after 73 years of age.  If you are overweight and have a high risk for diabetes, consider being tested at a younger age or more often. PREVENTING INFECTION  Hepatitis B  If you have a higher risk for hepatitis B, you should be screened for this virus. You are considered at high risk for hepatitis B if:  You were born in a country where hepatitis B is common. Ask your health care provider which countries are considered high risk.  Your parents were born in a high-risk country, and you have not been immunized against hepatitis B (hepatitis B vaccine).  You have HIV or AIDS.  You use needles to inject street drugs.  You live with someone who has hepatitis B.  You have had sex with someone who has hepatitis B.  You get hemodialysis treatment.  You take certain medicines for conditions, including cancer, organ transplantation, and autoimmune conditions. Hepatitis C  Blood testing is recommended for:  Everyone born from [birth year] through 1963.  Anyone with known risk factors for hepatitis C. Sexually transmitted infections (STIs)  You should be screened for sexually transmitted infections (STIs) including gonorrhea and chlamydia if:  You are sexually active and  are younger than 73 years of age.  You are older than 73 years of age and your health care provider tells you that you are at risk for this type of infection.  Your sexual activity has changed since you were last screened and you are at an increased risk for chlamydia or gonorrhea. Ask your health care provider if you are at risk.  If you do not have HIV, but are at risk, it may be recommended that you take a prescription medicine daily to prevent HIV infection. This is called pre-exposure prophylaxis (PrEP). You are considered at risk if:  You are sexually active and do not regularly use condoms or know the HIV status of your partner(s).  You take drugs by injection.  You are sexually active with a partner who has HIV. Talk with your health care provider about whether you are at high risk of being infected with HIV. If you choose to begin PrEP, you should first be tested for HIV. You should then be tested every 3 months for as long as you are taking PrEP.  PREGNANCY   If you are premenopausal and you may become pregnant, ask your health care provider about preconception counseling.  If you may become pregnant, take 400 to 800 micrograms (mcg) of folic acid every day.  If you want to prevent pregnancy, talk to your health care provider about birth control (contraception). OSTEOPOROSIS AND MENOPAUSE   Osteoporosis is a disease in which the bones lose minerals and strength with aging. This can result in serious bone fractures. Your risk for osteoporosis can be identified using a bone density scan.  If you are 38 years of age or older, or if you are at risk for osteoporosis and fractures, ask your health care provider if you should be screened.  Ask your health care provider whether you should take a calcium or vitamin D supplement to lower your risk for osteoporosis.  Menopause may have certain physical symptoms and risks.  Hormone replacement therapy may reduce some of these symptoms  and risks. Talk to your health care provider about whether hormone replacement therapy is right for you.  HOME CARE INSTRUCTIONS   Schedule regular health, dental, and eye exams.  Stay  current with your immunizations.   Do not use any tobacco products including cigarettes, chewing tobacco, or electronic cigarettes.  If you are pregnant, do not drink alcohol.  If you are breastfeeding, limit how much and how often you drink alcohol.  Limit alcohol intake to no more than 1 drink per day for nonpregnant women. One drink equals 12 ounces of beer, 5 ounces of wine, or 1 ounces of hard liquor.  Do not use street drugs.  Do not share needles.  Ask your health care provider for help if you need support or information about quitting drugs.  Tell your health care provider if you often feel depressed.  Tell your health care provider if you have ever been abused or do not feel safe at home.   This information is not intended to replace advice given to you by your health care provider. Make sure you discuss any questions you have with your health care provider.   Document Released: 06/30/2011 Document Revised: 01/05/2015 Document Reviewed: 11/16/2013 Elsevier Interactive Patient Education [employer].

## 2016-05-03 ENCOUNTER — Ambulatory Visit (INDEPENDENT_AMBULATORY_CARE_PROVIDER_SITE_OTHER): Payer: Medicare Other | Admitting: Family Medicine

## 2016-05-03 ENCOUNTER — Encounter: Payer: Self-pay | Admitting: Family Medicine

## 2016-05-03 VITALS — BP 108/68 | Temp 97.8°F | Ht 60.0 in | Wt 166.0 lb

## 2016-05-03 DIAGNOSIS — N3000 Acute cystitis without hematuria: Secondary | ICD-10-CM | POA: Diagnosis not present

## 2016-05-03 DIAGNOSIS — N390 Urinary tract infection, site not specified: Secondary | ICD-10-CM | POA: Insufficient documentation

## 2016-05-03 DIAGNOSIS — R300 Dysuria: Secondary | ICD-10-CM | POA: Diagnosis not present

## 2016-05-03 LAB — POC URINALSYSI DIPSTICK (AUTOMATED)
Blood, UA: NEGATIVE
Nitrite, UA: POSITIVE
Protein, UA: 15
Spec Grav, UA: 1.025
Urobilinogen, UA: 4
pH, UA: 5

## 2016-05-03 MED ORDER — CIPROFLOXACIN HCL 250 MG PO TABS: 250.0000 mg | ORAL_TABLET | Freq: Two times a day (BID) | ORAL | Status: DC

## 2016-05-03 MED ORDER — FLUCONAZOLE 150 MG PO TABS: 150.0000 mg | ORAL_TABLET | Freq: Once | ORAL | Status: DC

## 2016-05-03 NOTE — Patient Instructions (Signed)
You have a uti Drink water  Take the cipro as directed  Update if not starting to improve in a week or if worsening   Take diflucan for a yeast infection as needed  I sent these to your pharmacy

## 2016-05-03 NOTE — Assessment & Plan Note (Signed)
Pos ua -not enough to send for culture  Pt states she is intolerant to sulfa Px low dose cipro for 5 d Inc fluids Update if not starting to improve in a week or if worsening
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
## 2016-05-03 NOTE — Progress Notes (Signed)
Pre visit review using our clinic review tool, if applicable. No additional management support is needed unless otherwise documented below in the visit note. 

## 2016-05-06 ENCOUNTER — Encounter: Payer: Self-pay | Admitting: Internal Medicine

## 2016-05-06 ENCOUNTER — Encounter: Payer: Self-pay | Admitting: Pulmonary Disease

## 2016-05-06 MED ORDER — PREDNISONE 10 MG PO TABS: 10.0000 mg | ORAL_TABLET | Freq: Every day | ORAL | Status: DC

## 2016-05-06 MED ORDER — LORAZEPAM 1 MG PO TABS: ORAL_TABLET | ORAL | Status: DC

## 2016-05-06 NOTE — Telephone Encounter (Signed)
rx printed

## 2016-05-06 NOTE — Telephone Encounter (Signed)
RX request faxed to [facility]

## 2016-05-08 ENCOUNTER — Encounter: Payer: Self-pay | Admitting: Family Medicine

## 2016-05-12 ENCOUNTER — Telehealth: Payer: Self-pay | Admitting: Gastroenterology

## 2016-05-12 NOTE — Telephone Encounter (Signed)
Pt daughter has been advised and will get her an appt to discuss the gallbladder removal

## 2016-05-12 NOTE — Telephone Encounter (Signed)
Pt has had 3 episodes of vomiting after eating, She states she "feels"  When the episodes are going to happen.  No pain in regards to the episodes just SOB when she has to vomit up all the food she eats.  She had norovirus 3 weeks ago and the episodes began at that time.  She cancelled the appt she had with [facility] to discuss gallbladder removal.  I advised the pt she needs to see [facility] and to reschedule the appt.  Also, I will send to Dr Ardis Hughs for review.

## 2016-05-12 NOTE — Telephone Encounter (Signed)
I agree.  She was in our office a year ago, was recommended to see [facility] about GB resection. The symptoms she describes may be from gallstones and I still agree she should see them.  Thanks

## 2016-05-15 ENCOUNTER — Ambulatory Visit: Payer: Self-pay | Admitting: General Surgery

## 2016-05-15 ENCOUNTER — Other Ambulatory Visit: Payer: Self-pay | Admitting: General Surgery

## 2016-05-15 DIAGNOSIS — K8020 Calculus of gallbladder without cholecystitis without obstruction: Secondary | ICD-10-CM | POA: Diagnosis not present

## 2016-05-15 DIAGNOSIS — K449 Diaphragmatic hernia without obstruction or gangrene: Secondary | ICD-10-CM

## 2016-05-15 NOTE — H&P (Signed)
History of Present Illness Lisa Ok MD; 05/15/2016 9:25 AM) The patient is a 75 year old female who presents for evaluation of gall stones. The patient is a 75 year old female who is referred by Nicki Reaper Dr. Billey Gosling for evaluation of symptomatic gallstones. Patient has had a history over the last month over the last year or so with epigastric/chest pain/nausea, vomiting, and diarrhea. She states these are usually with higher fatty foods. Patient had an ultrasound which revealed multiple small gallstones. Patient states she has some dysphagia at times with both liquids and solids. She has had previous esophageal dilatation by Dr. Ardis Hughs. There are no currently no signs of hiatal hernia.   Allergies Shyrl Numbers, LPN; 624THL [id number] AM) Oxycodone-Acetaminophen *ANALGESICS - OPIOID* Sulfonamide Derivatives Diclofenac Sodium *ANALGESICS - ANTI-INFLAMMATORY* Rofecoxib *ANALGESICS - ANTI-INFLAMMATORY* TraMADol HCl *ANALGESICS - OPIOID* ACE Inhibitors Benazepril HCl *ANTIHYPERTENSIVES*  Medication History Shyrl Numbers, LPN; 624THL [id number] AM) LORazepam (1MG  Tablet, Oral) Active. Asmanex 60 Metered Doses (220MCG/INH Aero Pow Br Act, Inhalation) Active. Benzonatate (200MG  Capsule, Oral) Active. Losartan Potassium (100MG  Tablet, Oral) Active. Gabapentin (100MG  Capsule, Oral) Active. Fluticasone Propionate (50MCG/ACT Suspension, Nasal) Active. Albuterol (90MCG/ACT Aerosol Soln, Inhalation) Active. Calcium Carbonate (1250 (500 Ca)MG Tablet, Oral) Active. Cetirizine HCl (10MG  Tablet, Oral) Active. Vitamin D3 (1000UNIT Capsule, Oral) Active. Cymbalta (20MG  Capsule DR Part, Oral) Active. Zofran (4MG  Tablet, Oral) Active. Thiamine HCl (100MG  Tablet, Oral) Active. Medications Reconciled    Review of Systems [name]; 624THL [id number] AM) General Present- Appetite Loss, Chills, Fatigue, Fever and Night Sweats. Not Present- Weight Gain and  Weight Loss. Skin Present- Dryness, New Lesions, Non-Healing Wounds and Rash. Not Present- Change in Wart/Mole, Hives, Jaundice and Ulcer. HEENT Present- Hearing Loss, Oral Ulcers, Seasonal Allergies and Wears glasses/contact lenses. Not Present- Earache, Hoarseness, Nose Bleed, Ringing in the Ears, Sinus Pain, Sore Throat, Visual Disturbances and Yellow Eyes. Respiratory Present- Difficulty Breathing. Not Present- Bloody sputum, Chronic Cough, Snoring and Wheezing. Breast Not Present- Breast Mass, Breast Pain, Nipple Discharge and Skin Changes. Cardiovascular Present- Shortness of Breath and Swelling of Extremities. Not Present- Chest Pain, Difficulty Breathing Lying Down, Leg Cramps, Palpitations and Rapid Heart Rate. Gastrointestinal Present- Abdominal Pain, Bloating, Bloody Stool, Change in Bowel Habits, Difficulty Swallowing, Gets full quickly at meals, Indigestion, Nausea, Rectal Pain and Vomiting. Not Present- Chronic diarrhea, Constipation, Excessive gas and Hemorrhoids. Female Genitourinary Present- Frequency, Nocturia and Urgency. Not Present- Painful Urination and Pelvic Pain. Musculoskeletal Present- Back Pain, Joint Pain, Joint Stiffness and Swelling of Extremities. Not Present- Muscle Pain and Muscle Weakness. Neurological Present- Decreased Memory and Headaches. Not Present- Fainting, Numbness, Seizures, Tingling, Tremor, Trouble walking and Weakness. Psychiatric Present- Anxiety, Depression and Fearful. Not Present- Bipolar, Change in Sleep Pattern and Frequent crying. Endocrine Present- Heat Intolerance. Not Present- Cold Intolerance, Excessive Hunger, Hair Changes, Hot flashes and New Diabetes. Hematology Present- Easy Bruising. Not Present- Excessive bleeding, Gland problems, HIV and Persistent Infections.  Vitals [name]; 624THL [id number] AM) 05/15/2016 8:59 AM Weight: 165.6 lb Height: 60in Height was reported by patient. Body Surface Area: 1.72 m Body Mass  Index: 32.34 kg/m  Temp.: 98.41F(Oral)  Pulse: 102 (Regular)  Resp.: 20 (Unlabored)  P.OX: 95% (Room air) BP: 132/86 (Sitting, Left Arm, Standard) patient HR elevated due to walk to room      Physical Exam Lisa Ok, MD; 05/15/2016 9:26 AM) General Mental Status-Alert. General Appearance-Consistent with stated age. Hydration-Well hydrated. Voice-Normal.  Head and Neck Head-normocephalic, atraumatic with no lesions or palpable  masses.  Eye Eyeball - Bilateral-Extraocular movements intact. Sclera/Conjunctiva - Bilateral-No scleral icterus.  Chest and Lung Exam Chest and lung exam reveals -quiet, even and easy respiratory effort with no use of accessory muscles. Inspection Chest Wall - Normal. Back - normal.  Cardiovascular Cardiovascular examination reveals -normal heart sounds, regular rate and rhythm with no murmurs.  Abdomen Inspection Normal Exam - No Hernias. Palpation/Percussion Normal exam - Soft, Non Tender, No Rebound tenderness, No Rigidity (guarding) and No hepatosplenomegaly. Auscultation Normal exam - Bowel sounds normal.  Neurologic Neurologic evaluation reveals -alert and oriented x 3 with no impairment of recent or remote memory. Mental Status-Normal.  Musculoskeletal Normal Exam - Left-Upper Extremity Strength Normal and Lower Extremity Strength Normal. Normal Exam - Right-Upper Extremity Strength Normal, Lower Extremity Weakness.    Assessment & Plan Lisa Ok MD; 05/15/2016 9:26 AM) SYMPTOMATIC CHOLELITHIASIS (K80.20) Impression: 75 year old female with likely symptomatic cholelithiasis 1. Will proceed with an upper GI to evaluate for possible recurrent hiatal hernia. 2. If this is negative we will proceed with laparoscopic cholecystectomy 3. Risks and benefits were discussed with the patient to generally include, but not limited to: infection, bleeding, possible need for post op ERCP, damage to  the bile ducts, bile leak, and possible need for further surgery. Alternatives were offered and described. All questions were answered and the patient voiced understanding of the procedure and wishes to proceed at this point with a laparoscopic cholecystectomy

## 2016-05-16 ENCOUNTER — Encounter: Payer: Self-pay | Admitting: Internal Medicine

## 2016-05-16 ENCOUNTER — Ambulatory Visit
Admission: RE | Admit: 2016-05-16 | Discharge: 2016-05-16 | Disposition: A | Discharge status: Home / Self Care | Payer: Medicare Other | Source: Ambulatory Visit | Attending: General Surgery | Admitting: General Surgery

## 2016-05-16 DIAGNOSIS — K449 Diaphragmatic hernia without obstruction or gangrene: Secondary | ICD-10-CM

## 2016-05-16 DIAGNOSIS — K219 Gastro-esophageal reflux disease without esophagitis: Secondary | ICD-10-CM | POA: Diagnosis not present

## 2016-05-17 MED ORDER — OMEPRAZOLE 20 MG PO CPDR: DELAYED_RELEASE_CAPSULE | ORAL | Status: DC

## 2016-05-22 ENCOUNTER — Encounter (HOSPITAL_COMMUNITY): Payer: Self-pay | Admitting: *Deleted

## 2016-05-23 ENCOUNTER — Encounter (HOSPITAL_COMMUNITY): Payer: Self-pay | Admitting: Anesthesiology

## 2016-05-23 ENCOUNTER — Ambulatory Visit (HOSPITAL_COMMUNITY): Payer: Medicare Other

## 2016-05-23 ENCOUNTER — Ambulatory Visit (HOSPITAL_COMMUNITY)
Admission: RE | Admit: 2016-05-23 | Discharge: 2016-05-23 | Disposition: A | Discharge status: Home / Self Care | Payer: Medicare Other | Source: Ambulatory Visit | Attending: General Surgery | Admitting: General Surgery

## 2016-05-23 ENCOUNTER — Ambulatory Visit (HOSPITAL_COMMUNITY): Payer: Medicare Other | Admitting: Anesthesiology

## 2016-05-23 ENCOUNTER — Encounter (HOSPITAL_COMMUNITY)
Admission: RE | Disposition: A | Discharge status: Home / Self Care | Payer: Self-pay | Source: Ambulatory Visit | Attending: General Surgery

## 2016-05-23 ENCOUNTER — Ambulatory Visit: Payer: Medicare Other | Admitting: Physical Medicine & Rehabilitation

## 2016-05-23 DIAGNOSIS — I10 Essential (primary) hypertension: Secondary | ICD-10-CM | POA: Insufficient documentation

## 2016-05-23 DIAGNOSIS — Z7951 Long term (current) use of inhaled steroids: Secondary | ICD-10-CM | POA: Insufficient documentation

## 2016-05-23 DIAGNOSIS — Z79899 Other long term (current) drug therapy: Secondary | ICD-10-CM | POA: Diagnosis not present

## 2016-05-23 DIAGNOSIS — K8020 Calculus of gallbladder without cholecystitis without obstruction: Secondary | ICD-10-CM | POA: Diagnosis present

## 2016-05-23 DIAGNOSIS — F329 Major depressive disorder, single episode, unspecified: Secondary | ICD-10-CM | POA: Insufficient documentation

## 2016-05-23 DIAGNOSIS — K8010 Calculus of gallbladder with chronic cholecystitis without obstruction: Secondary | ICD-10-CM | POA: Diagnosis not present

## 2016-05-23 DIAGNOSIS — M1990 Unspecified osteoarthritis, unspecified site: Secondary | ICD-10-CM | POA: Diagnosis not present

## 2016-05-23 DIAGNOSIS — J449 Chronic obstructive pulmonary disease, unspecified: Secondary | ICD-10-CM | POA: Insufficient documentation

## 2016-05-23 DIAGNOSIS — Z01818 Encounter for other preprocedural examination: Secondary | ICD-10-CM | POA: Diagnosis not present

## 2016-05-23 DIAGNOSIS — R9389 Abnormal findings on diagnostic imaging of other specified body structures: Secondary | ICD-10-CM

## 2016-05-23 DIAGNOSIS — K219 Gastro-esophageal reflux disease without esophagitis: Secondary | ICD-10-CM | POA: Diagnosis not present

## 2016-05-23 HISTORY — DX: Urinary tract infection, site not specified: N39.0

## 2016-05-23 HISTORY — DX: Pneumonia, unspecified organism: J18.9

## 2016-05-23 HISTORY — DX: Personal history of other diseases of the digestive system: Z87.19

## 2016-05-23 HISTORY — DX: Unspecified urinary incontinence: R32

## 2016-05-23 HISTORY — PX: CHOLECYSTECTOMY: SHX55

## 2016-05-23 LAB — COMPREHENSIVE METABOLIC PANEL
ALT: 24 U/L (ref 14–54)
AST: 29 U/L (ref 15–41)
Albumin: 3.9 g/dL (ref 3.5–5.0)
Alkaline Phosphatase: 48 U/L (ref 38–126)
Anion gap: 6 (ref 5–15)
BUN: 10 mg/dL (ref 6–20)
CO2: 29 mmol/L (ref 22–32)
Calcium: 9.3 mg/dL (ref 8.9–10.3)
Chloride: 106 mmol/L (ref 101–111)
Creatinine, Ser: 1.2 mg/dL — ABNORMAL HIGH (ref 0.44–1.00)
GFR calc Af Amer: 51 mL/min — ABNORMAL LOW (ref >60)
GFR calc non Af Amer: 44 mL/min — ABNORMAL LOW (ref >60)
Glucose, Bld: 109 mg/dL — ABNORMAL HIGH (ref 65–99)
Potassium: 4.1 mmol/L (ref 3.5–5.1)
Sodium: 141 mmol/L (ref 135–145)
Total Bilirubin: 0.8 mg/dL (ref 0.3–1.2)
Total Protein: 6.9 g/dL (ref 6.5–8.1)

## 2016-05-23 LAB — CBC
HCT: 37.1 % (ref 36.0–46.0)
Hemoglobin: 12.6 g/dL (ref 12.0–15.0)
MCH: 33.4 pg (ref 26.0–34.0)
MCHC: 34 g/dL (ref 30.0–36.0)
MCV: 98.4 fL (ref 78.0–100.0)
Platelets: 165 10*3/uL (ref 150–400)
RBC: 3.77 MIL/uL — ABNORMAL LOW (ref 3.87–5.11)
RDW: 13.6 % (ref 11.5–15.5)
WBC: 8.9 10*3/uL (ref 4.0–10.5)

## 2016-05-23 SURGERY — LAPAROSCOPIC CHOLECYSTECTOMY
Anesthesia: General

## 2016-05-23 MED ORDER — SODIUM CHLORIDE 0.9% FLUSH: 3.0000 mL | INTRAVENOUS | Status: DC | PRN

## 2016-05-23 MED ORDER — ACETAMINOPHEN 650 MG RE SUPP
650.0000 mg | RECTAL | Status: DC | PRN
  Filled 2016-05-23: qty 1

## 2016-05-23 MED ORDER — BUPIVACAINE-EPINEPHRINE (PF) 0.25% -1:200000 IJ SOLN
INTRAMUSCULAR | Status: AC
  Filled 2016-05-23: qty 30

## 2016-05-23 MED ORDER — ACETAMINOPHEN 325 MG PO TABS: 650.0000 mg | ORAL_TABLET | ORAL | Status: DC | PRN

## 2016-05-23 MED ORDER — PROPOFOL 10 MG/ML IV BOLUS
INTRAVENOUS | Status: DC | PRN
  Administered 2016-05-23: 150 mg via INTRAVENOUS

## 2016-05-23 MED ORDER — HYDROMORPHONE HCL 1 MG/ML IJ SOLN
0.2500 mg | INTRAMUSCULAR | Status: DC | PRN
  Administered 2016-05-23 (×2): 0.5 mg via INTRAVENOUS

## 2016-05-23 MED ORDER — HYDROMORPHONE HCL 1 MG/ML IJ SOLN
INTRAMUSCULAR | Status: DC
Start: 2016-05-23 — End: 2016-05-23
  Filled 2016-05-23: qty 1

## 2016-05-23 MED ORDER — MORPHINE SULFATE (PF) 10 MG/ML IV SOLN
2.0000 mg | INTRAVENOUS | Status: DC | PRN
Start: 2016-05-23 — End: 2016-05-23

## 2016-05-23 MED ORDER — LACTATED RINGERS IV SOLN
INTRAVENOUS | Status: DC | PRN
  Administered 2016-05-23: 11:00:00 via INTRAVENOUS

## 2016-05-23 MED ORDER — BUPIVACAINE-EPINEPHRINE (PF) 0.25% -1:200000 IJ SOLN
INTRAMUSCULAR | Status: DC | PRN
  Administered 2016-05-23: 5 mL

## 2016-05-23 MED ORDER — FENTANYL CITRATE (PF) 100 MCG/2ML IJ SOLN
25.0000 ug | INTRAMUSCULAR | Status: DC | PRN
  Administered 2016-05-23 (×2): 50 ug via INTRAVENOUS

## 2016-05-23 MED ORDER — PROPOFOL 10 MG/ML IV BOLUS
INTRAVENOUS | Status: AC
  Filled 2016-05-23: qty 20

## 2016-05-23 MED ORDER — FENTANYL CITRATE (PF) 100 MCG/2ML IJ SOLN
INTRAMUSCULAR | Status: AC
  Filled 2016-05-23: qty 2

## 2016-05-23 MED ORDER — LACTATED RINGERS IR SOLN
Status: DC | PRN
  Administered 2016-05-23: 1000 mL

## 2016-05-23 MED ORDER — CEFAZOLIN SODIUM-DEXTROSE 2-4 GM/100ML-% IV SOLN
2.0000 g | INTRAVENOUS | Status: AC
  Administered 2016-05-23: 2 g via INTRAVENOUS

## 2016-05-23 MED ORDER — SODIUM CHLORIDE 0.9 % IV SOLN: 250.0000 mL | INTRAVENOUS | Status: DC | PRN

## 2016-05-23 MED ORDER — LACTATED RINGERS IV SOLN: INTRAVENOUS | Status: DC

## 2016-05-23 MED ORDER — SODIUM CHLORIDE 0.9% FLUSH: 3.0000 mL | Freq: Two times a day (BID) | INTRAVENOUS | Status: DC

## 2016-05-23 MED ORDER — SUGAMMADEX SODIUM 200 MG/2ML IV SOLN
INTRAVENOUS | Status: DC | PRN
  Administered 2016-05-23: 150 mg via INTRAVENOUS

## 2016-05-23 MED ORDER — CEFAZOLIN SODIUM-DEXTROSE 2-4 GM/100ML-% IV SOLN
INTRAVENOUS | Status: AC
  Filled 2016-05-23: qty 100

## 2016-05-23 MED ORDER — HYDROCORTISONE NA SUCCINATE PF 100 MG IJ SOLR
INTRAMUSCULAR | Status: AC
  Filled 2016-05-23: qty 2

## 2016-05-23 MED ORDER — FENTANYL CITRATE (PF) 250 MCG/5ML IJ SOLN
INTRAMUSCULAR | Status: AC
  Filled 2016-05-23: qty 5

## 2016-05-23 MED ORDER — HYDROCORTISONE NA SUCCINATE PF 1000 MG IJ SOLR
INTRAMUSCULAR | Status: DC | PRN
  Administered 2016-05-23: 50 mg via INTRAVENOUS

## 2016-05-23 MED ORDER — 0.9 % SODIUM CHLORIDE (POUR BTL) OPTIME
TOPICAL | Status: DC | PRN
  Administered 2016-05-23: 1000 mL

## 2016-05-23 MED ORDER — CHLORHEXIDINE GLUCONATE 4 % EX LIQD: 1.0000 "application " | Freq: Once | CUTANEOUS | Status: DC

## 2016-05-23 MED ORDER — PROCHLORPERAZINE EDISYLATE 5 MG/ML IJ SOLN: 10.0000 mg | Freq: Once | INTRAMUSCULAR | Status: DC | PRN

## 2016-05-23 MED ORDER — ONDANSETRON HCL 4 MG/2ML IJ SOLN
INTRAMUSCULAR | Status: DC | PRN
  Administered 2016-05-23: 4 mg via INTRAVENOUS

## 2016-05-23 MED ORDER — FENTANYL CITRATE (PF) 100 MCG/2ML IJ SOLN
INTRAMUSCULAR | Status: DC | PRN
  Administered 2016-05-23: 100 ug via INTRAVENOUS
  Administered 2016-05-23 (×2): 50 ug via INTRAVENOUS

## 2016-05-23 MED ORDER — ROCURONIUM BROMIDE 100 MG/10ML IV SOLN
INTRAVENOUS | Status: DC | PRN
  Administered 2016-05-23: 25 mg via INTRAVENOUS

## 2016-05-23 MED ORDER — HYDROCODONE-ACETAMINOPHEN 5-325 MG PO TABS: 1.0000 | ORAL_TABLET | Freq: Four times a day (QID) | ORAL | Status: DC | PRN

## 2016-05-23 MED ORDER — LIDOCAINE HCL (CARDIAC) 20 MG/ML IV SOLN
INTRAVENOUS | Status: DC | PRN
  Administered 2016-05-23: 50 mg via INTRAVENOUS

## 2016-05-23 MED ORDER — HYDROCODONE-ACETAMINOPHEN 5-325 MG PO TABS
1.0000 | ORAL_TABLET | Freq: Once | ORAL | Status: AC
  Administered 2016-05-23: 1 via ORAL
  Filled 2016-05-23: qty 1

## 2016-05-23 SURGICAL SUPPLY — 44 items
APL SKNCLS STERI-STRIP NONHPOA (GAUZE/BANDAGES/DRESSINGS) ×1
APPLIER CLIP 5 13 M/L LIGAMAX5 (MISCELLANEOUS)
APR CLP MED LRG 5 ANG JAW (MISCELLANEOUS)
BENZOIN TINCTURE PRP APPL 2/3 (GAUZE/BANDAGES/DRESSINGS) ×2 IMPLANT
CABLE HIGH FREQUENCY MONO STRZ (ELECTRODE) ×3 IMPLANT
CHLORAPREP W/TINT 26ML (MISCELLANEOUS) ×3 IMPLANT
CLIP APPLIE 5 13 M/L LIGAMAX5 (MISCELLANEOUS) IMPLANT
CLIP LIGATING HEMO O LOK GREEN (MISCELLANEOUS) ×5 IMPLANT
CLOSURE WOUND 1/2 X4 (GAUZE/BANDAGES/DRESSINGS) ×1
COVER MAYO STAND STRL (DRAPES) IMPLANT
COVER SURGICAL LIGHT HANDLE (MISCELLANEOUS) ×1 IMPLANT
COVER TRANSDUCER ULTRASND (DRAPES) ×3 IMPLANT
DECANTER SPIKE VIAL GLASS SM (MISCELLANEOUS) ×3 IMPLANT
DEVICE TROCAR PUNCTURE CLOSURE (ENDOMECHANICALS) ×3 IMPLANT
DRAPE C-ARM 42X120 X-RAY (DRAPES) IMPLANT
DRAPE LAPAROSCOPIC ABDOMINAL (DRAPES) ×3 IMPLANT
DRAPE UTILITY XL STRL (DRAPES) ×3 IMPLANT
ELECT REM PT RETURN 9FT ADLT (ELECTROSURGICAL) ×3
ELECTRODE REM PT RTRN 9FT ADLT (ELECTROSURGICAL) ×1 IMPLANT
ENDOLOOP SUT PDS II  0 18 (SUTURE) ×2
ENDOLOOP SUT PDS II 0 18 (SUTURE) IMPLANT
GAUZE SPONGE 2X2 8PLY STRL LF (GAUZE/BANDAGES/DRESSINGS) ×1 IMPLANT
GAUZE SPONGE 4X4 12PLY STRL (GAUZE/BANDAGES/DRESSINGS) ×1 IMPLANT
GLOVE BIO SURGEON STRL SZ7.5 (GLOVE) ×5 IMPLANT
GOWN STRL REUS W/TWL XL LVL3 (GOWN DISPOSABLE) ×8 IMPLANT
HEMOSTAT SURGICEL 4X8 (HEMOSTASIS) IMPLANT
KIT BASIN OR (CUSTOM PROCEDURE TRAY) ×3 IMPLANT
NDL INSUFFLATION 14GA 120MM (NEEDLE) ×1 IMPLANT
NEEDLE INSUFFLATION 14GA 120MM (NEEDLE) ×3 IMPLANT
PAD POSITIONING PINK XL (MISCELLANEOUS) IMPLANT
POSITIONER SURGICAL ARM (MISCELLANEOUS) IMPLANT
SCISSORS LAP 5X35 DISP (ENDOMECHANICALS) ×3 IMPLANT
SET CHOLANGIOGRAPH MIX (MISCELLANEOUS) IMPLANT
SET IRRIG TUBING LAPAROSCOPIC (IRRIGATION / IRRIGATOR) ×3 IMPLANT
SPONGE GAUZE 2X2 STER 10/PKG (GAUZE/BANDAGES/DRESSINGS) ×2
STRIP CLOSURE SKIN 1/2X4 (GAUZE/BANDAGES/DRESSINGS) ×2 IMPLANT
SUT MNCRL AB 4-0 PS2 18 (SUTURE) ×3 IMPLANT
TAPE CLOTH 4X10 WHT NS (GAUZE/BANDAGES/DRESSINGS) IMPLANT
TOWEL OR 17X26 10 PK STRL BLUE (TOWEL DISPOSABLE) ×3 IMPLANT
TOWEL OR NON WOVEN STRL DISP B (DISPOSABLE) ×3 IMPLANT
TRAY LAPAROSCOPIC (CUSTOM PROCEDURE TRAY) ×3 IMPLANT
TROCAR BLADELESS OPT 5 75 (ENDOMECHANICALS) ×3 IMPLANT
TROCAR SLEEVE XCEL 5X75 (ENDOMECHANICALS) ×3 IMPLANT
TROCAR XCEL NON-BLD 11X100MML (ENDOMECHANICALS) ×3 IMPLANT

## 2016-05-23 NOTE — Interval H&P Note (Signed)
History and Physical Interval Note:  05/23/2016 7:20 AM  Lisa Larson  has presented today for surgery, with the diagnosis of Gallstones  The various methods of treatment have been discussed with the patient and family. After consideration of risks, benefits and other options for treatment, the patient has consented to  Procedure(s): LAPAROSCOPIC CHOLECYSTECTOMY (N/A) as a surgical intervention .  The patient's history has been reviewed, patient examined, no change in status, stable for surgery.  I have reviewed the patient's chart and labs.  Questions were answered to the patient's satisfaction.     Rosario Jacks., Anne Hahn
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
## 2016-05-23 NOTE — Progress Notes (Signed)
Patient ambulated in hallway with one person assist and straight cane. Tolerated well. To bathroom and voided moderate amount of clear yellow urine.

## 2016-05-23 NOTE — H&P (View-Only) (Signed)
History of Present Illness Ralene Ok MD; 05/15/2016 9:25 AM) The patient is a 75 year old female who presents for evaluation of gall stones. The patient is a 75 year old female who is referred by Nicki Reaper Dr. Billey Gosling for evaluation of symptomatic gallstones. Patient has had a history over the last month over the last year or so with epigastric/chest pain/nausea, vomiting, and diarrhea. She states these are usually with higher fatty foods. Patient had an ultrasound which revealed multiple small gallstones. Patient states she has some dysphagia at times with both liquids and solids. She has had previous esophageal dilatation by Dr. Ardis Hughs. There are no currently no signs of hiatal hernia.   Allergies Shyrl Numbers, LPN; 624THL [id number] AM) Oxycodone-Acetaminophen *ANALGESICS - OPIOID* Sulfonamide Derivatives Diclofenac Sodium *ANALGESICS - ANTI-INFLAMMATORY* Rofecoxib *ANALGESICS - ANTI-INFLAMMATORY* TraMADol HCl *ANALGESICS - OPIOID* ACE Inhibitors Benazepril HCl *ANTIHYPERTENSIVES*  Medication History Shyrl Numbers, LPN; 624THL [id number] AM) LORazepam (1MG  Tablet, Oral) Active. Asmanex 60 Metered Doses (220MCG/INH Aero Pow Br Act, Inhalation) Active. Benzonatate (200MG  Capsule, Oral) Active. Losartan Potassium (100MG  Tablet, Oral) Active. Gabapentin (100MG  Capsule, Oral) Active. Fluticasone Propionate (50MCG/ACT Suspension, Nasal) Active. Albuterol (90MCG/ACT Aerosol Soln, Inhalation) Active. Calcium Carbonate (1250 (500 Ca)MG Tablet, Oral) Active. Cetirizine HCl (10MG  Tablet, Oral) Active. Vitamin D3 (1000UNIT Capsule, Oral) Active. Cymbalta (20MG  Capsule DR Part, Oral) Active. Zofran (4MG  Tablet, Oral) Active. Thiamine HCl (100MG  Tablet, Oral) Active. Medications Reconciled    Review of Systems [name]; 624THL [id number] AM) General Present- Appetite Loss, Chills, Fatigue, Fever and Night Sweats. Not Present- Weight Gain and  Weight Loss. Skin Present- Dryness, New Lesions, Non-Healing Wounds and Rash. Not Present- Change in Wart/Mole, Hives, Jaundice and Ulcer. HEENT Present- Hearing Loss, Oral Ulcers, Seasonal Allergies and Wears glasses/contact lenses. Not Present- Earache, Hoarseness, Nose Bleed, Ringing in the Ears, Sinus Pain, Sore Throat, Visual Disturbances and Yellow Eyes. Respiratory Present- Difficulty Breathing. Not Present- Bloody sputum, Chronic Cough, Snoring and Wheezing. Breast Not Present- Breast Mass, Breast Pain, Nipple Discharge and Skin Changes. Cardiovascular Present- Shortness of Breath and Swelling of Extremities. Not Present- Chest Pain, Difficulty Breathing Lying Down, Leg Cramps, Palpitations and Rapid Heart Rate. Gastrointestinal Present- Abdominal Pain, Bloating, Bloody Stool, Change in Bowel Habits, Difficulty Swallowing, Gets full quickly at meals, Indigestion, Nausea, Rectal Pain and Vomiting. Not Present- Chronic diarrhea, Constipation, Excessive gas and Hemorrhoids. Female Genitourinary Present- Frequency, Nocturia and Urgency. Not Present- Painful Urination and Pelvic Pain. Musculoskeletal Present- Back Pain, Joint Pain, Joint Stiffness and Swelling of Extremities. Not Present- Muscle Pain and Muscle Weakness. Neurological Present- Decreased Memory and Headaches. Not Present- Fainting, Numbness, Seizures, Tingling, Tremor, Trouble walking and Weakness. Psychiatric Present- Anxiety, Depression and Fearful. Not Present- Bipolar, Change in Sleep Pattern and Frequent crying. Endocrine Present- Heat Intolerance. Not Present- Cold Intolerance, Excessive Hunger, Hair Changes, Hot flashes and New Diabetes. Hematology Present- Easy Bruising. Not Present- Excessive bleeding, Gland problems, HIV and Persistent Infections.  Vitals [name]; 624THL [id number] AM) 05/15/2016 8:59 AM Weight: 165.6 lb Height: 60in Height was reported by patient. Body Surface Area: 1.72 m Body Mass  Index: 32.34 kg/m  Temp.: 98.43F(Oral)  Pulse: 102 (Regular)  Resp.: 20 (Unlabored)  P.OX: 95% (Room air) BP: 132/86 (Sitting, Left Arm, Standard) patient HR elevated due to walk to room      Physical Exam Ralene Ok, MD; 05/15/2016 9:26 AM) General Mental Status-Alert. General Appearance-Consistent with stated age. Hydration-Well hydrated. Voice-Normal.  Head and Neck Head-normocephalic, atraumatic with no lesions or palpable  masses.  Eye Eyeball - Bilateral-Extraocular movements intact. Sclera/Conjunctiva - Bilateral-No scleral icterus.  Chest and Lung Exam Chest and lung exam reveals -quiet, even and easy respiratory effort with no use of accessory muscles. Inspection Chest Wall - Normal. Back - normal.  Cardiovascular Cardiovascular examination reveals -normal heart sounds, regular rate and rhythm with no murmurs.  Abdomen Inspection Normal Exam - No Hernias. Palpation/Percussion Normal exam - Soft, Non Tender, No Rebound tenderness, No Rigidity (guarding) and No hepatosplenomegaly. Auscultation Normal exam - Bowel sounds normal.  Neurologic Neurologic evaluation reveals -alert and oriented x 3 with no impairment of recent or remote memory. Mental Status-Normal.  Musculoskeletal Normal Exam - Left-Upper Extremity Strength Normal and Lower Extremity Strength Normal. Normal Exam - Right-Upper Extremity Strength Normal, Lower Extremity Weakness.    Assessment & Plan Ralene Ok MD; 05/15/2016 9:26 AM) SYMPTOMATIC CHOLELITHIASIS (K80.20) Impression: 75 year old female with likely symptomatic cholelithiasis 1. Will proceed with an upper GI to evaluate for possible recurrent hiatal hernia. 2. If this is negative we will proceed with laparoscopic cholecystectomy 3. Risks and benefits were discussed with the patient to generally include, but not limited to: infection, bleeding, possible need for post op ERCP, damage to  the bile ducts, bile leak, and possible need for further surgery. Alternatives were offered and described. All questions were answered and the patient voiced understanding of the procedure and wishes to proceed at this point with a laparoscopic cholecystectomy

## 2016-05-23 NOTE — Discharge Instructions (Signed)
[facility] ______[facility] °LAPAROSCOPIC SURGERY: POST OP INSTRUCTIONS °Always review your discharge instruction sheet given to you by the facility where your surgery was performed. °IF YOU HAVE DISABILITY OR FAMILY LEAVE FORMS, YOU MUST BRING THEM TO THE OFFICE FOR PROCESSING.   °DO NOT GIVE THEM TO YOUR DOCTOR. ° °1. A prescription for pain medication may be given to you upon discharge.  Take your pain medication as prescribed, if needed.  If narcotic pain medicine is not needed, then you may take acetaminophen (Tylenol) or ibuprofen (Advil) as needed. °2. Take your usually prescribed medications unless otherwise directed. °3. If you need a refill on your pain medication, please contact your pharmacy.  They will contact our office to request authorization. Prescriptions will not be filled after 5pm or on week-ends. °4. You should follow a light diet the first few days after arrival home, such as soup and crackers, etc.  Be sure to include lots of fluids daily. °5. Most patients will experience some swelling and bruising in the area of the incisions.  Ice packs will help.  Swelling and bruising can take several days to resolve.  °6. It is common to experience some constipation if taking pain medication after surgery.  Increasing fluid intake and taking a stool softener (such as Colace) will usually help or prevent this problem from occurring.  A mild laxative (Milk of Magnesia or Miralax) should be taken according to package instructions if there are no bowel movements after 48 hours. °7. Unless discharge instructions indicate otherwise, you may remove your bandages 24-48 hours after surgery, and you may shower at that time.  You may have steri-strips (small skin tapes) in place directly over the incision.  These strips should be left on the skin for 7-10 days.  If your surgeon used skin glue on the incision, you may shower in 24 hours.  The glue will flake off over the next 2-3 weeks.  Any sutures or  staples will be removed at the office during your follow-up visit. °8. ACTIVITIES:  You may resume regular (light) daily activities beginning the next day--such as daily self-care, walking, climbing stairs--gradually increasing activities as tolerated.  You may have sexual intercourse when it is comfortable.  Refrain from any heavy lifting or straining until approved by your doctor. °a. You may drive when you are no longer taking prescription pain medication, you can comfortably wear a seatbelt, and you can safely maneuver your car and apply brakes. °b. RETURN TO WORK:  __________________________________________________________ °9. You should see your doctor in the office for a follow-up appointment approximately 2-3 weeks after your surgery.  Make sure that you call for this appointment within a day or two after you arrive home to insure a convenient appointment time. °10. OTHER INSTRUCTIONS: __________________________________________________________________________________________________________________________ __________________________________________________________________________________________________________________________ °WHEN TO CALL YOUR DOCTOR: °1. Fever over 101.0 °2. Inability to urinate °3. Continued bleeding from incision. °4. Increased pain, redness, or drainage from the incision. °5. Increasing abdominal pain ° °The clinic staff is available to answer your questions during regular business hours.  Please don’t hesitate to call and ask to speak to one of the nurses for clinical concerns.  If you have a medical emergency, go to the nearest emergency room or call 911.  A surgeon from [facility] is always on call at the hospital. °[address], [address], [place], [place]  [address] ? [address] [address], [place],    [address] °[phone] ? [phone] ? FAX [phone] °Web site:   [email or web address]  General Anesthesia, Adult, Care After Refer to this sheet  in the next few weeks. These instructions provide you with information on caring for yourself after your procedure. Your health care provider may also give you more specific instructions. Your treatment has been planned according to current medical practices, but problems sometimes occur. Call your health care provider if you have any problems or questions after your procedure. WHAT TO EXPECT AFTER THE PROCEDURE After the procedure, it is typical to experience:  Sleepiness.  Nausea and vomiting. HOME CARE INSTRUCTIONS  For the first 24 hours after general anesthesia:  Have a responsible person with you.  Do not drive a car. If you are alone, do not take public transportation.  Do not drink alcohol.  Do not take medicine that has not been prescribed by your health care provider.  Do not sign important papers or make important decisions.  You may resume a normal diet and activities as directed by your health care provider.  Change bandages (dressings) as directed.  If you have questions or problems that seem related to general anesthesia, call the hospital and ask for the anesthetist or anesthesiologist on call. SEEK MEDICAL CARE IF:  You have nausea and vomiting that continue the day after anesthesia.  You develop a rash. SEEK IMMEDIATE MEDICAL CARE IF:   You have difficulty breathing.  You have chest pain.  You have any allergic problems.   This information is not intended to replace advice given to you by your health care provider. Make sure you discuss any questions you have with your health care provider.   Document Released: 03/23/2001 Document Revised: 01/05/2015 Document Reviewed: 04/14/2012 Elsevier Interactive Patient Education 2016 [place].  Deep breathing and coughing exercises as instructed. Leg and foot exercises as instructed.

## 2016-05-23 NOTE — Anesthesia Postprocedure Evaluation (Signed)
Anesthesia Post Note  Patient: Lisa Larson  Procedure(s) Performed: Procedure(s) (LRB): LAPAROSCOPIC CHOLECYSTECTOMY (N/A)  Patient location during evaluation: PACU Anesthesia Type: General Level of consciousness: awake and alert Pain management: pain level controlled Vital Signs Assessment: post-procedure vital signs reviewed and stable Respiratory status: spontaneous breathing, nonlabored ventilation, respiratory function stable and patient connected to nasal cannula oxygen Cardiovascular status: blood pressure returned to baseline and stable Postop Assessment: no signs of nausea or vomiting Anesthetic complications: no Comments: Moves all four extremities to command in PACU. No neck complaints.    Last Vitals:  Filed Vitals:   05/23/16 1336 05/23/16 1345  BP:  124/68  Pulse: 86   Temp:  36.4 C  Resp: 17 16    Last Pain:  Filed Vitals:   05/23/16 1422  PainSc: 4                  Marcial Pless J

## 2016-05-23 NOTE — Op Note (Signed)
05/23/2016  12:13 PM  PATIENT:  Lisa Larson  75 y.o. female  PRE-OPERATIVE DIAGNOSIS:  Gallstones  POST-OPERATIVE DIAGNOSIS:  gallstones  PROCEDURE:  Procedure(s): LAPAROSCOPIC CHOLECYSTECTOMY (N/A)  SURGEON:  Surgeon(s) and Role:    * Ralene Ok, MD - Primary   ANESTHESIA:   local and general  EBL:  Total I/O In: -  Out: 20 [Blood:<5cc]  BLOOD ADMINISTERED:none  DRAINS: none   LOCAL MEDICATIONS USED:  BUPIVICAINE   SPECIMEN:  Source of Specimen:  gallbladder  DISPOSITION OF SPECIMEN:  PATHOLOGY  COUNTS:  YES  TOURNIQUET:  * No tourniquets in log *  DICTATION: .Dragon Dictation  EBL: [id number]   Complications: none   Counts: reported as correct x 2   Findings:chronic inflammation of the gallbladder and gallstones  Indications for procedure: Pt is a 75 y/o F with RUQ pain and seen to have gallstones.   Details of the procedure: The patient was taken to the operating and placed in the supine position with bilateral SCDs in place. A time out was called and all facts were verified. A pneumoperitoneum was obtained via A Veress needle technique to a pressure of 49mm of mercury. A 51mm trochar was then placed in the right upper quadrant under visualization, and there were no injuries to any abdominal organs. A 11 mm port was then placed in the umbilical region after infiltrating with local anesthesia under direct visualization. A second epigastric port was placed under direct visualization.   The gallbladder was identified and retracted, the peritoneum was then sharply dissected from the gallbladder and this dissection was carried down to Calot's triangle. The cystic duct was identified and stripped away circumferentially and seen going into the gallbladder 360, the critical angle was obtained.    2 clips were placed proximally one distally and the cystic duct transected.  Upon cutting the duct, 1 of the 2 clips was displaced.  A PDS Endoloop was used to ligate  the duct. The cystic artery was identified and 2 clips placed proximally and one distally and transected. We then proceeded to remove the gallbladder off the hepatic fossa with Bovie cautery. A retrieval bag was then placed in the abdomen and gallbladder placed in the bag. The hepatic fossa was then reexamined and hemostasis was achieved with Bovie cautery and was excellent at this portion of the case. The subhepatic fossa and perihepatic fossa was then irrigated until the effluent was clear. The specimen bag and specimen were removed from the abdominal cavity.  The 11 mm trocar fascia was reapproximated with the Endo Close #1 Vicryl x2. The pneumoperitoneum was evacuated and all trochars removed under direct visulalization. The skin was then closed with 4-0 Monocryl and the skin dressed with Steri-Strips, gauze, and tape. The patient was awaken from general anesthesia and taken to the recovery room in stable condition.    PLAN OF CARE: Discharge to home after PACU  PATIENT DISPOSITION:  PACU - hemodynamically stable.   Delay start of Pharmacological VTE agent (>24hrs) due to surgical blood loss or risk of bleeding: not applicable

## 2016-05-23 NOTE — Anesthesia Procedure Notes (Signed)
Procedure Name: Intubation Date/Time: 05/23/2016 11:31 AM Performed by: Anne Fu Pre-anesthesia Checklist: Patient identified, Emergency Drugs available, Suction available, Patient being monitored and Timeout performed Patient Re-evaluated:Patient Re-evaluated prior to inductionOxygen Delivery Method: Circle system utilized Preoxygenation: Pre-oxygenation with 100% oxygen Intubation Type: IV induction Ventilation: Mask ventilation without difficulty Laryngoscope Size: Mac and 3 Grade View: Grade I Tube type: Oral Tube size: 7.5 mm Number of attempts: 1 Airway Equipment and Method: Video-laryngoscopy Placement Confirmation: ETT inserted through vocal cords under direct vision,  positive ETCO2,  CO2 detector and breath sounds checked- equal and bilateral Secured at: 21 cm Tube secured with: Tape Dental Injury: Teeth and Oropharynx as per pre-operative assessment  Comments: c-spine precautions performed patient placed in neutral position with video scoped used.  DL without C-spine movement Grade I view noted with easy placement of 7.81mm ETT X 1 attempt. Noted bilat breath sounds.

## 2016-05-23 NOTE — Transfer of Care (Signed)
Immediate Anesthesia Transfer of Care Note  Patient: Lisa Larson  Procedure(s) Performed: Procedure(s): LAPAROSCOPIC CHOLECYSTECTOMY (N/A)  Patient Location: PACU  Anesthesia Type:General  Level of Consciousness:  sedated, patient cooperative and responds to stimulation  Airway & Oxygen Therapy:Patient Spontanous Breathing and Patient connected to face mask oxgen  Post-op Assessment:  Report given to PACU RN and Post -op Vital signs reviewed and stable  Post vital signs:  Reviewed and stable  Last Vitals:  Filed Vitals:   05/23/16 0938  BP: 122/74  Pulse: 88  Temp: 36.6 C  Resp: 18    Complications: No apparent anesthesia complications

## 2016-05-29 ENCOUNTER — Ambulatory Visit
Admission: RE | Admit: 2016-05-29 | Discharge: 2016-05-29 | Disposition: A | Discharge status: Home / Self Care | Payer: Medicare Other | Source: Ambulatory Visit | Attending: Physician Assistant | Admitting: Physician Assistant

## 2016-05-29 ENCOUNTER — Other Ambulatory Visit: Payer: Self-pay | Admitting: Physician Assistant

## 2016-05-29 DIAGNOSIS — R52 Pain, unspecified: Secondary | ICD-10-CM

## 2016-05-29 DIAGNOSIS — M79632 Pain in left forearm: Secondary | ICD-10-CM | POA: Diagnosis not present

## 2016-06-09 ENCOUNTER — Telehealth: Payer: Self-pay | Admitting: Internal Medicine

## 2016-06-09 NOTE — Telephone Encounter (Signed)
PLEASE NOTE: All timestamps contained within this report are represented as [place] Standard Time. CONFIDENTIALTY NOTICE: This fax transmission is intended only for the addressee. It contains information that is legally privileged, confidential or otherwise protected from use or disclosure. If you are not the intended recipient, you are strictly prohibited from reviewing, disclosing, copying using or disseminating any of this information or taking any action in reliance on or regarding this information. If you have received this fax in error, please notify us immediately by telephone so that we can arrange for its return to [place]. Phone: [phone], Toll-Free: [phone], Fax: [phone] Page: 1 of 1 Call Id: [id number] [place] [facility] - Client [place] Patient Name: Lisa Larson DOB: August 19, 1941 Initial Comment Mother is having tightness in the chest Nurse Assessment Nurse: Dimas Chyle, RN, Dellis Filbert Date/Time Eilene Ghazi Time): 06/09/2016 11:20:21 AM Confirm and document reason for call. If symptomatic, describe symptoms. You must click the next button to save text entered. ---Mother is having tightness in the chest. Previous gallbladder surgery on 05/23/16. Started in last week. Has the patient traveled out of the country within the last 30 days? ---No Does the patient have any new or worsening symptoms? ---Yes Will a triage be completed? ---Yes Related visit to physician within the last 2 weeks? ---No Does the PT have any chronic conditions? (i.e. diabetes, asthma, etc.) ---Yes List chronic conditions. ---HTN Is this a behavioral health or substance abuse call? ---No Guidelines Guideline Title Affirmed Question Affirmed Notes Chest Pain [1] Chest pain(s) lasting a few seconds AND [2] persists > 3 days Final Disposition User See PCP When Office is Open (within 3 days) Dimas Chyle, RN, [employer] Referrals REFERRED TO PCP  OFFICE Disagree/Comply: [facility]

## 2016-06-11 ENCOUNTER — Encounter: Payer: Self-pay | Admitting: Internal Medicine

## 2016-06-11 ENCOUNTER — Ambulatory Visit (INDEPENDENT_AMBULATORY_CARE_PROVIDER_SITE_OTHER)
Admission: RE | Admit: 2016-06-11 | Discharge: 2016-06-11 | Disposition: A | Discharge status: Home / Self Care | Payer: Medicare Other | Source: Ambulatory Visit | Attending: Internal Medicine | Admitting: Internal Medicine

## 2016-06-11 ENCOUNTER — Ambulatory Visit (INDEPENDENT_AMBULATORY_CARE_PROVIDER_SITE_OTHER): Payer: Medicare Other | Admitting: Internal Medicine

## 2016-06-11 VITALS — BP 152/98 | HR 69 | Temp 98.1°F | Resp 18 | Wt 177.0 lb

## 2016-06-11 DIAGNOSIS — R0789 Other chest pain: Secondary | ICD-10-CM

## 2016-06-11 DIAGNOSIS — R059 Cough, unspecified: Secondary | ICD-10-CM | POA: Insufficient documentation

## 2016-06-11 DIAGNOSIS — R05 Cough: Secondary | ICD-10-CM

## 2016-06-11 NOTE — Assessment & Plan Note (Signed)
Dry cough-no fever, shortness of breath or wheeze Will check chest x-ray given her respiratory history Continue deep breathing Inhalers as needed

## 2016-06-11 NOTE — Assessment & Plan Note (Signed)
Lower chest and upper abdominal tightness for 2 weeks-fairly constant Cholecystectomy just over 2 weeks ago Does have a dry cough, but no other concerning symptoms Will check a chest x-ray EKG here is normal She does have an appointment with surgery tomorrow and will discuss this with them as well. Her abdomen is distended, but she is obese. She does have some tenderness with palpation, which is chronic-she denies any changes in her degree of tenderness
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
## 2016-06-11 NOTE — Progress Notes (Signed)
Pre visit review using our clinic review tool, if applicable. No additional management support is needed unless otherwise documented below in the visit note. 

## 2016-06-11 NOTE — Patient Instructions (Addendum)
A chest xray was ordered.  Test(s) ordered today. Your results will be released to [place] (or called to you) after review, usually within 72hours after test completion. If any changes need to be made, you will be notified at that same time.   Medications reviewed and updated.  No changes recommended at this time.

## 2016-06-27 ENCOUNTER — Ambulatory Visit: Payer: Medicare Other | Admitting: Neurology

## 2016-07-14 ENCOUNTER — Other Ambulatory Visit: Payer: Self-pay | Admitting: Adult Health

## 2016-07-21 ENCOUNTER — Encounter: Payer: Self-pay | Admitting: Internal Medicine

## 2016-07-21 ENCOUNTER — Telehealth: Payer: Self-pay | Admitting: Internal Medicine

## 2016-07-21 ENCOUNTER — Other Ambulatory Visit: Payer: Medicare Other

## 2016-07-21 DIAGNOSIS — R300 Dysuria: Secondary | ICD-10-CM | POA: Diagnosis not present

## 2016-07-21 MED ORDER — CIPROFLOXACIN HCL 250 MG PO TABS: 250.0000 mg | ORAL_TABLET | Freq: Two times a day (BID) | ORAL | 0 refills | Status: DC

## 2016-07-21 NOTE — Telephone Encounter (Signed)
Patient states she has another UTI. We are full for today.  Patient does not want to go to another office.  She tried to complete an EVisit.  Her computer could not get the EVisit pulled up.  Patient is requesting an antibiotic or at least an order to be sent to the lab.  Please follow up with patient in regard.

## 2016-07-21 NOTE — Telephone Encounter (Signed)
LVM informing pt

## 2016-07-21 NOTE — Telephone Encounter (Signed)
UA, UCx ordered Will send an antibiotic to pof since culture will take a couple of days.  She should still give a urine sample.    cipro sent - I believe that worked for her last time.

## 2016-07-23 ENCOUNTER — Other Ambulatory Visit (INDEPENDENT_AMBULATORY_CARE_PROVIDER_SITE_OTHER): Payer: Medicare Other

## 2016-07-23 DIAGNOSIS — R300 Dysuria: Secondary | ICD-10-CM

## 2016-07-23 LAB — URINALYSIS, ROUTINE W REFLEX MICROSCOPIC
Hgb urine dipstick: NEGATIVE
Leukocytes, UA: NEGATIVE
Nitrite: NEGATIVE
Specific Gravity, Urine: >=1.03 — AB
Urine Glucose: NEGATIVE
Urobilinogen, UA: 0.2
pH: 5.5 (ref 5.0–8.0)

## 2016-07-26 ENCOUNTER — Telehealth: Payer: Self-pay | Admitting: Internal Medicine

## 2016-07-26 ENCOUNTER — Encounter: Payer: Self-pay | Admitting: Internal Medicine

## 2016-07-26 LAB — URINE CULTURE

## 2016-07-26 NOTE — Telephone Encounter (Signed)
It basically showed no significant urinary infection. If she is having symptoms still, then a re-collection of urine for analysis and culture is necessary at her earliest convenience.-thx

## 2016-07-26 NOTE — Telephone Encounter (Signed)
Called and spoke with pt and pt is aware of Dr. Idelle Leech recommendations.  Pt states she will come into the office next week and get the sterile materials to collect another sample.

## 2016-07-26 NOTE — Telephone Encounter (Signed)
Pt called stating she received an email 7/29 about her urine culture.  She stated she doesn't understand what it say.  She would like a call back. Or you can email her what it means Best number [phone]

## 2016-09-17 ENCOUNTER — Telehealth: Payer: Self-pay | Admitting: Pulmonary Disease

## 2016-09-17 ENCOUNTER — Telehealth: Payer: Self-pay | Admitting: Internal Medicine

## 2016-09-17 DIAGNOSIS — R300 Dysuria: Secondary | ICD-10-CM

## 2016-09-17 DIAGNOSIS — N390 Urinary tract infection, site not specified: Secondary | ICD-10-CM

## 2016-09-17 MED ORDER — PREDNISONE 10 MG PO TABS: 10.0000 mg | ORAL_TABLET | Freq: Every day | ORAL | 2 refills | Status: DC

## 2016-09-17 MED ORDER — FLUTICASONE PROPIONATE 50 MCG/ACT NA SUSP: 2.0000 | Freq: Every day | NASAL | 5 refills | Status: DC

## 2016-09-17 MED ORDER — CIPROFLOXACIN HCL 250 MG PO TABS: 250.0000 mg | ORAL_TABLET | Freq: Two times a day (BID) | ORAL | 0 refills | Status: DC

## 2016-09-17 NOTE — Telephone Encounter (Signed)
Pt has a pending appt with [facility] on 11/9.  Pt was requesting refills of prednisone and flonase.  These have been sent to the pharmacy and nothing further is needed.

## 2016-09-17 NOTE — Telephone Encounter (Signed)
Referral ordered

## 2016-09-17 NOTE — Telephone Encounter (Signed)
Patient states she has a UTI again.  She would like to know if Dr. Quay Burow would call her in cipro again to pharmacy and allow her to bring urine specimen to lab.

## 2016-09-17 NOTE — Telephone Encounter (Signed)
Spoke with pt, She has picked up RX from the pharmacy. Will come in tomorrow for specimen. Okay with having referral entered for Urology.

## 2016-09-17 NOTE — Telephone Encounter (Signed)
Patient has called back in regard?  °

## 2016-09-17 NOTE — Telephone Encounter (Signed)
cipro sent to pos.  I would like her to give a urine sample.  I think this is the forth UTI this year and it may be a good idea for her to see urology. I am afraid we are over treating her.   Let me know if she agrees and I will order a referral.

## 2016-09-17 NOTE — Telephone Encounter (Signed)
Please advise if pt needs to come in for UA

## 2016-09-18 ENCOUNTER — Other Ambulatory Visit (INDEPENDENT_AMBULATORY_CARE_PROVIDER_SITE_OTHER): Payer: Medicare Other

## 2016-09-18 ENCOUNTER — Encounter: Payer: Self-pay | Admitting: Internal Medicine

## 2016-09-18 ENCOUNTER — Other Ambulatory Visit: Payer: Self-pay | Admitting: Internal Medicine

## 2016-09-18 DIAGNOSIS — R300 Dysuria: Secondary | ICD-10-CM

## 2016-09-18 LAB — URINALYSIS, ROUTINE W REFLEX MICROSCOPIC
Bilirubin Urine: NEGATIVE
Hgb urine dipstick: NEGATIVE
Ketones, ur: NEGATIVE
Leukocytes, UA: NEGATIVE
Nitrite: NEGATIVE
Specific Gravity, Urine: >=1.03 — AB (ref 1.000–1.030)
Total Protein, Urine: NEGATIVE
Urine Glucose: NEGATIVE
Urobilinogen, UA: 0.2 (ref 0.0–1.0)
pH: 5.5 (ref 5.0–8.0)

## 2016-09-19 LAB — URINE CULTURE

## 2016-09-20 ENCOUNTER — Encounter: Payer: Self-pay | Admitting: Internal Medicine

## 2016-09-22 ENCOUNTER — Ambulatory Visit: Payer: Medicare Other | Admitting: Pulmonary Disease

## 2016-09-22 ENCOUNTER — Other Ambulatory Visit (INDEPENDENT_AMBULATORY_CARE_PROVIDER_SITE_OTHER): Payer: Medicare Other

## 2016-09-22 ENCOUNTER — Encounter: Payer: Self-pay | Admitting: Internal Medicine

## 2016-09-22 ENCOUNTER — Ambulatory Visit (INDEPENDENT_AMBULATORY_CARE_PROVIDER_SITE_OTHER): Payer: Medicare Other | Admitting: Internal Medicine

## 2016-09-22 VITALS — BP 116/80 | HR 89 | Temp 98.1°F | Resp 18 | Wt 171.0 lb

## 2016-09-22 DIAGNOSIS — R740 Nonspecific elevation of levels of transaminase and lactic acid dehydrogenase [LDH]: Secondary | ICD-10-CM

## 2016-09-22 DIAGNOSIS — R946 Abnormal results of thyroid function studies: Secondary | ICD-10-CM | POA: Diagnosis not present

## 2016-09-22 DIAGNOSIS — N3000 Acute cystitis without hematuria: Secondary | ICD-10-CM

## 2016-09-22 DIAGNOSIS — R7401 Elevation of levels of liver transaminase levels: Secondary | ICD-10-CM

## 2016-09-22 DIAGNOSIS — Z13820 Encounter for screening for osteoporosis: Secondary | ICD-10-CM

## 2016-09-22 DIAGNOSIS — H9313 Tinnitus, bilateral: Secondary | ICD-10-CM

## 2016-09-22 DIAGNOSIS — F32A Depression, unspecified: Secondary | ICD-10-CM

## 2016-09-22 DIAGNOSIS — F329 Major depressive disorder, single episode, unspecified: Secondary | ICD-10-CM

## 2016-09-22 DIAGNOSIS — Z23 Encounter for immunization: Secondary | ICD-10-CM

## 2016-09-22 DIAGNOSIS — H9190 Unspecified hearing loss, unspecified ear: Secondary | ICD-10-CM | POA: Diagnosis not present

## 2016-09-22 DIAGNOSIS — Z833 Family history of diabetes mellitus: Secondary | ICD-10-CM

## 2016-09-22 DIAGNOSIS — R739 Hyperglycemia, unspecified: Secondary | ICD-10-CM | POA: Diagnosis not present

## 2016-09-22 DIAGNOSIS — F411 Generalized anxiety disorder: Secondary | ICD-10-CM

## 2016-09-22 DIAGNOSIS — Z780 Asymptomatic menopausal state: Secondary | ICD-10-CM

## 2016-09-22 DIAGNOSIS — M8580 Other specified disorders of bone density and structure, unspecified site: Secondary | ICD-10-CM

## 2016-09-22 DIAGNOSIS — I10 Essential (primary) hypertension: Secondary | ICD-10-CM

## 2016-09-22 DIAGNOSIS — K219 Gastro-esophageal reflux disease without esophagitis: Secondary | ICD-10-CM

## 2016-09-22 DIAGNOSIS — R7402 Elevation of levels of lactic acid dehydrogenase (LDH): Secondary | ICD-10-CM

## 2016-09-22 DIAGNOSIS — J45998 Other asthma: Secondary | ICD-10-CM

## 2016-09-22 LAB — COMPREHENSIVE METABOLIC PANEL
ALT: 57 U/L — ABNORMAL HIGH (ref 0–35)
AST: 48 U/L — ABNORMAL HIGH (ref 0–37)
Albumin: 4.3 g/dL (ref 3.5–5.2)
Alkaline Phosphatase: 65 U/L (ref 39–117)
BUN: 15 mg/dL (ref 6–23)
CO2: 28 mEq/L (ref 19–32)
Calcium: 9.3 mg/dL (ref 8.4–10.5)
Chloride: 104 mEq/L (ref 96–112)
Creatinine, Ser: 0.85 mg/dL (ref 0.40–1.20)
GFR: 69.6 mL/min (ref >60.00)
Glucose, Bld: 118 mg/dL — ABNORMAL HIGH (ref 70–99)
Potassium: 4.5 mEq/L (ref 3.5–5.1)
Sodium: 138 mEq/L (ref 135–145)
Total Bilirubin: 0.6 mg/dL (ref 0.2–1.2)
Total Protein: 7.4 g/dL (ref 6.0–8.3)

## 2016-09-22 LAB — LACTATE DEHYDROGENASE: LDH: 224 U/L (ref 120–250)

## 2016-09-22 LAB — HEMOGLOBIN A1C: Hgb A1c MFr Bld: 5 % (ref 4.6–6.5)

## 2016-09-22 LAB — TSH: TSH: 1.7 u[IU]/mL (ref 0.35–4.50)

## 2016-09-22 MED ORDER — LORAZEPAM 1 MG PO TABS: ORAL_TABLET | ORAL | 0 refills | Status: DC

## 2016-09-22 MED ORDER — OMEPRAZOLE 20 MG PO CPDR: 20.0000 mg | DELAYED_RELEASE_CAPSULE | Freq: Every day | ORAL | 3 refills | Status: DC

## 2016-09-22 NOTE — Assessment & Plan Note (Signed)
Family history of DM Check a1c

## 2016-09-22 NOTE — Assessment & Plan Note (Signed)
Taking cymbalta daily, ativan Controlled, stable Continue current dose of medication

## 2016-09-22 NOTE — Assessment & Plan Note (Signed)
Symptoms currently controlled Continue current regimen Will follow up with pulmonary

## 2016-09-22 NOTE — Assessment & Plan Note (Addendum)
BP well controlled Current regimen effective and well tolerated Continue current medications at current doses Cmp, tsh 

## 2016-09-22 NOTE — Assessment & Plan Note (Signed)
GERD controlled - taking medication daily She will try taking medication every other day - but if she is symptomatic she will restart daily.

## 2016-09-22 NOTE — Assessment & Plan Note (Signed)
Check a1c 

## 2016-09-22 NOTE — Progress Notes (Signed)
Pre visit review using our clinic review tool, if applicable. No additional management support is needed unless otherwise documented below in the visit note. 

## 2016-09-22 NOTE — Assessment & Plan Note (Signed)
Controlled, stable Continue current dose of medication  

## 2016-09-22 NOTE — Assessment & Plan Note (Addendum)
Taking calcium and vitamin d daily On chronic prednisone dexa due -- ordered

## 2016-09-22 NOTE — Assessment & Plan Note (Signed)
Finished Cipro No current symptoms Urine culture showed no significant bacteria Has had several UTI's this year Referred to urology

## 2016-09-22 NOTE — Patient Instructions (Signed)
  Test(s) ordered today. Your results will be released to [place] (or called to you) after review, usually within 72hours after test completion. If any changes need to be made, you will be notified at that same time.  All other Health Maintenance issues reviewed.   All recommended immunizations and age-appropriate screenings are up-to-date or discussed.  Flu vaccine administered today.   Medications reviewed and updated.  No changes recommended at this time.  Your prescription(s) have been submitted to your pharmacy. Please take as directed and contact our office if you believe you are having problem(s) with the medication(s).  A referral was ordered for ENT  Please followup in 6 months for a wellness and follow up visit

## 2016-09-22 NOTE — Telephone Encounter (Signed)
Can we go ahead and send antibiotic in for pt. Sent mychart message saying she is still in pain and can not control her urination. Urine culture results are not back yet.
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
## 2016-09-24 DIAGNOSIS — M25562 Pain in left knee: Secondary | ICD-10-CM | POA: Diagnosis not present

## 2016-09-25 ENCOUNTER — Encounter: Payer: Self-pay | Admitting: Internal Medicine

## 2016-10-22 ENCOUNTER — Ambulatory Visit: Payer: Medicare Other | Admitting: Neurology

## 2016-10-27 ENCOUNTER — Encounter: Payer: Self-pay | Admitting: Neurology

## 2016-10-27 ENCOUNTER — Ambulatory Visit (INDEPENDENT_AMBULATORY_CARE_PROVIDER_SITE_OTHER): Payer: Medicare Other | Admitting: Neurology

## 2016-10-27 VITALS — BP 152/88 | HR 114 | Ht 60.0 in | Wt 171.2 lb

## 2016-10-27 DIAGNOSIS — F0781 Postconcussional syndrome: Secondary | ICD-10-CM | POA: Diagnosis not present

## 2016-10-27 DIAGNOSIS — R413 Other amnesia: Secondary | ICD-10-CM | POA: Diagnosis not present
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
## 2016-10-27 NOTE — Patient Instructions (Signed)
Continue with reducing alcohol intake as this can cause worsening memory. Follow-up in 1 year, call for any changes.

## 2016-11-04 DIAGNOSIS — H903 Sensorineural hearing loss, bilateral: Secondary | ICD-10-CM | POA: Diagnosis not present

## 2016-11-06 ENCOUNTER — Encounter: Payer: Self-pay | Admitting: Pulmonary Disease

## 2016-11-06 ENCOUNTER — Other Ambulatory Visit: Payer: Medicare Other

## 2016-11-06 ENCOUNTER — Other Ambulatory Visit (INDEPENDENT_AMBULATORY_CARE_PROVIDER_SITE_OTHER): Payer: Medicare Other

## 2016-11-06 ENCOUNTER — Ambulatory Visit (INDEPENDENT_AMBULATORY_CARE_PROVIDER_SITE_OTHER): Payer: Medicare Other | Admitting: Pulmonary Disease

## 2016-11-06 ENCOUNTER — Ambulatory Visit (INDEPENDENT_AMBULATORY_CARE_PROVIDER_SITE_OTHER)
Admission: RE | Admit: 2016-11-06 | Discharge: 2016-11-06 | Disposition: A | Discharge status: Home / Self Care | Payer: Medicare Other | Source: Ambulatory Visit | Attending: Pulmonary Disease | Admitting: Pulmonary Disease

## 2016-11-06 VITALS — BP 112/80 | HR 98 | Ht 60.0 in | Wt 171.8 lb

## 2016-11-06 DIAGNOSIS — J45998 Other asthma: Secondary | ICD-10-CM

## 2016-11-06 DIAGNOSIS — R05 Cough: Secondary | ICD-10-CM | POA: Diagnosis not present

## 2016-11-06 DIAGNOSIS — J82 Pulmonary eosinophilia, not elsewhere classified: Secondary | ICD-10-CM | POA: Diagnosis not present

## 2016-11-06 DIAGNOSIS — J8289 Other pulmonary eosinophilia, not elsewhere classified: Secondary | ICD-10-CM

## 2016-11-06 DIAGNOSIS — N3000 Acute cystitis without hematuria: Secondary | ICD-10-CM | POA: Diagnosis not present

## 2016-11-06 LAB — URINALYSIS, ROUTINE W REFLEX MICROSCOPIC
Bilirubin Urine: NEGATIVE
Ketones, ur: NEGATIVE
Nitrite: NEGATIVE
Specific Gravity, Urine: 1.015 (ref 1.000–1.030)
Total Protein, Urine: NEGATIVE
Urine Glucose: NEGATIVE
Urobilinogen, UA: 0.2 (ref 0.0–1.0)
pH: 6.5 (ref 5.0–8.0)

## 2016-11-06 MED ORDER — CIPROFLOXACIN HCL 250 MG PO TABS: 250.0000 mg | ORAL_TABLET | Freq: Two times a day (BID) | ORAL | 0 refills | Status: DC

## 2016-11-06 MED ORDER — BENZONATATE 200 MG PO CAPS: 200.0000 mg | ORAL_CAPSULE | Freq: Three times a day (TID) | ORAL | 2 refills | Status: DC | PRN

## 2016-11-06 MED ORDER — PREDNISONE 5 MG PO TABS: 7.5000 mg | ORAL_TABLET | Freq: Every day | ORAL | 3 refills | Status: DC

## 2016-11-06 NOTE — Progress Notes (Signed)
Current Outpatient Prescriptions on File Prior to Visit  Medication Sig  . acetaminophen (TYLENOL) 325 MG tablet Take 325 mg by mouth every 6 (six) hours as needed (For pain.).  Marland Kitchen albuterol (PROAIR HFA) 108 (90 BASE) MCG/ACT inhaler Inhale 2 puffs into the lungs every 6 (six) hours as needed. (Patient taking differently: Inhale 2 puffs into the lungs every 6 (six) hours as needed for wheezing or shortness of breath. )  . calcium carbonate (OS-CAL) 600 MG TABS tablet Take 600 mg by mouth daily.   . Cetirizine HCl 10 MG CAPS Take 1 capsule (10 mg total) by mouth daily.  . Cholecalciferol (VITAMIN D3) 2000 UNITS capsule Take 2,000 Units by mouth daily.  . DULoxetine (CYMBALTA) 30 MG capsule TAKE ONE CAPSULE BY MOUTH DIALY (Patient taking differently: Take 30 mg by mouth daily. )  . fluticasone (FLONASE) 50 MCG/ACT nasal spray Place 2 sprays into both nostrils daily.  Marland Kitchen gabapentin (NEURONTIN) 100 MG capsule Take 1 capsule (100 mg total) by mouth 3 (three) times daily. (Patient taking differently: Take 100 mg by mouth 2 (two) times daily. )  . LORazepam (ATIVAN) 1 MG tablet TAKE 1/2 TABLET BY MOUTH EVERY 8 TO 12 HOURS AS NEEDED  . losartan (COZAAR) 100 MG tablet Take 1 tablet (100 mg total) by mouth daily.  . mometasone (ASMANEX 60 METERED DOSES) 220 MCG/INH inhaler Inhale 2 puffs into the lungs daily.  Marland Kitchen omeprazole (PRILOSEC) 20 MG capsule Take 1 capsule (20 mg total) by mouth daily.  . Probiotic Product (PHILLIPS COLON HEALTH PO) Take 1 capsule by mouth daily as needed (For overall digestive health.).   Marland Kitchen thiamine (VITAMIN B-1) 100 MG tablet Take 100 mg by mouth daily as needed (For energy - takes only during the winter months.).    No current facility-administered medications on file prior to visit.      Chief Complaint  Patient presents with  . Follow-up    Pt reports cough since getting the flu vaccine. Requests refill of Benzonatate - requests this be printed. CXR today.      Pulmonary  tests CBC 01/14/11>>39% eosinophils 01/14/11>> HIV negative, ACE 39, RF negative, ANA 1:40, ANCA negative  CT chest 01/16/11>>Multifocal b/l ASD with GGO BAL 01/21/11>>48% Eosinophils  IgE 02/25/11>>282 CT chest 07/22/11>>resolution of ASD CT chest 10/03/11>>recurrence of nodular infiltrate Rt upper lobe PFT 11/04/12>>FEV1 1.77 (87%), FEV1% 68, TLC 4.87 (101%), DLCO 69%, +BD from FEF 25-75%.  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 female former smoker with eosinophilic pneumonia, asthma.  Her breathing has been okay except for cough that she developed after getting flu shot in September.  Cough is slowing getting better.  She is not having wheeze, fever, hemoptysis, or chest pain.  She has noticed burning when she passes urine.  She has frequent UTIs, and is set up to see urology next month.  Physical Exam  General - No distress ENT - No sinus tenderness, no oral exudate, no LAN Cardiac - s1s2 regular, no murmur Chest - no wheeze/rales Back - No focal tenderness Abd - Soft,  non-tender Ext - No edema Neuro - Normal strength Skin - No rashes Psych - normal mood, and behavior   Dg Chest 2 View  Result Date: 11/06/2016 CLINICAL DATA:  Nonproductive cough for the past 2 months. History of asthma -COPD and previous episodes of pneumonia. Former smoker. EXAM: CHEST  2 VIEW COMPARISON:  PA and lateral chest x-ray of June 11, 2016 FINDINGS: The lungs are adequately inflated. The interstitial markings are coarse in the lower lung zones. The heart and pulmonary vascularity are normal. The mediastinum is normal in width. There is  calcification in the wall of the aortic arch. There is mild multilevel degenerative disc disease of the thoracic spine. There is old deformity of the proximal left humerus. IMPRESSION: COPD. Stable right middle lobe and lingular scarring. No alveolar pneumonia nor CHF. Aortic atherosclerosis. Electronically Signed   By: David  Martinique M.D.   On: 11/06/2016 10:47    Assessment/Plan  Hx of eosinophilic pneumonia. - stable - will have her change prednisone to 7.5 mg daily  Persistent asthma. - continue asmanex and prn proair - renewed benzonatate  Allergic rhinitis. - continue flonase  Frequent UTIs. - will give course of cipro - will check U/A and urine culture - she has appt with urology scheduled   Patient Instructions  Change prednisone to 7.5 mg daily Cipro one pill twice daily for 5 days Will order urine culture Follow up in 3 months    [name], MD [place] [facility]Critical [facility]/Sleep Pager:  [phone] 11/06/2016, 11:35 AM

## 2016-11-06 NOTE — Patient Instructions (Signed)
Change prednisone to 7.5 mg daily Cipro one pill twice daily for 5 days Will order urine culture Follow up in 3 months

## 2016-11-09 LAB — URINE CULTURE

## 2016-11-10 ENCOUNTER — Telehealth: Payer: Self-pay | Admitting: Pulmonary Disease

## 2016-11-10 NOTE — Telephone Encounter (Signed)
Recent Results (from the past 240 hour(s))  Urine Culture     Status: None   Collection Time: 11/06/16 12:59 PM  Result Value Ref Range Status   Culture ESCHERICHIA COLI  Final   Colony Count Greater than 100,000 CFU/mL  Final   Organism ID, Bacteria ESCHERICHIA COLI  Final      Susceptibility   Escherichia coli -  (no method available)    AMPICILLIN 4 Sensitive     AMOX/CLAVULANIC <=2 Sensitive     AMPICILLIN/SULBACTAM <=2 Sensitive     PIP/TAZO <=4 Sensitive     IMIPENEM <=0.25 Sensitive     CEFAZOLIN <=4 Not Reportable     CEFTRIAXONE <=1 Sensitive     CEFTAZIDIME <=1 Sensitive     CEFEPIME <=1 Sensitive     GENTAMICIN <=1 Sensitive     TOBRAMYCIN <=1 Sensitive     CIPROFLOXACIN <=0.25 Sensitive     LEVOFLOXACIN <=0.12 Sensitive     NITROFURANTOIN 32 Sensitive     TRIMETH/SULFA* <=20 Sensitive      * NR=NOT REPORTABLE,SEE COMMENTORAL therapy:A cefazolin MIC of <32 predicts susceptibility to the oral agents cefaclor,cefdinir,cefpodoxime,cefprozil,cefuroxime,cephalexin,and loracarbef when used for therapy of uncomplicated UTIs due to E.coli,K.pneumomiae,and P.mirabilis. PARENTERAL therapy: A cefazolinMIC of >8 indicates resistance to parenteralcefazolin. An alternate test method must beperformed to confirm susceptibility to parenteralcefazolin.     Will have my nurse inform pt that urine culture grew E coli.  This is a common cause of urinary tract infections.  The bug is sensitive to the antibiotic she is using.  No change to current tx plan.

## 2016-11-11 ENCOUNTER — Encounter: Payer: Self-pay | Admitting: Pulmonary Disease

## 2016-11-11 NOTE — Telephone Encounter (Signed)
See pt email dated 11/11/16

## 2016-11-12 DIAGNOSIS — N952 Postmenopausal atrophic vaginitis: Secondary | ICD-10-CM | POA: Diagnosis not present

## 2016-11-12 DIAGNOSIS — N3000 Acute cystitis without hematuria: Secondary | ICD-10-CM | POA: Diagnosis not present

## 2016-11-15 IMAGING — DX DG CHEST 2V
2 series · 2 of 2 positions shown · non-contrast
Comparison: 03/17/2016

CLINICAL DATA: Preop for cholecystectomy

EXAM:
CHEST  2 VIEW

[chest lat]
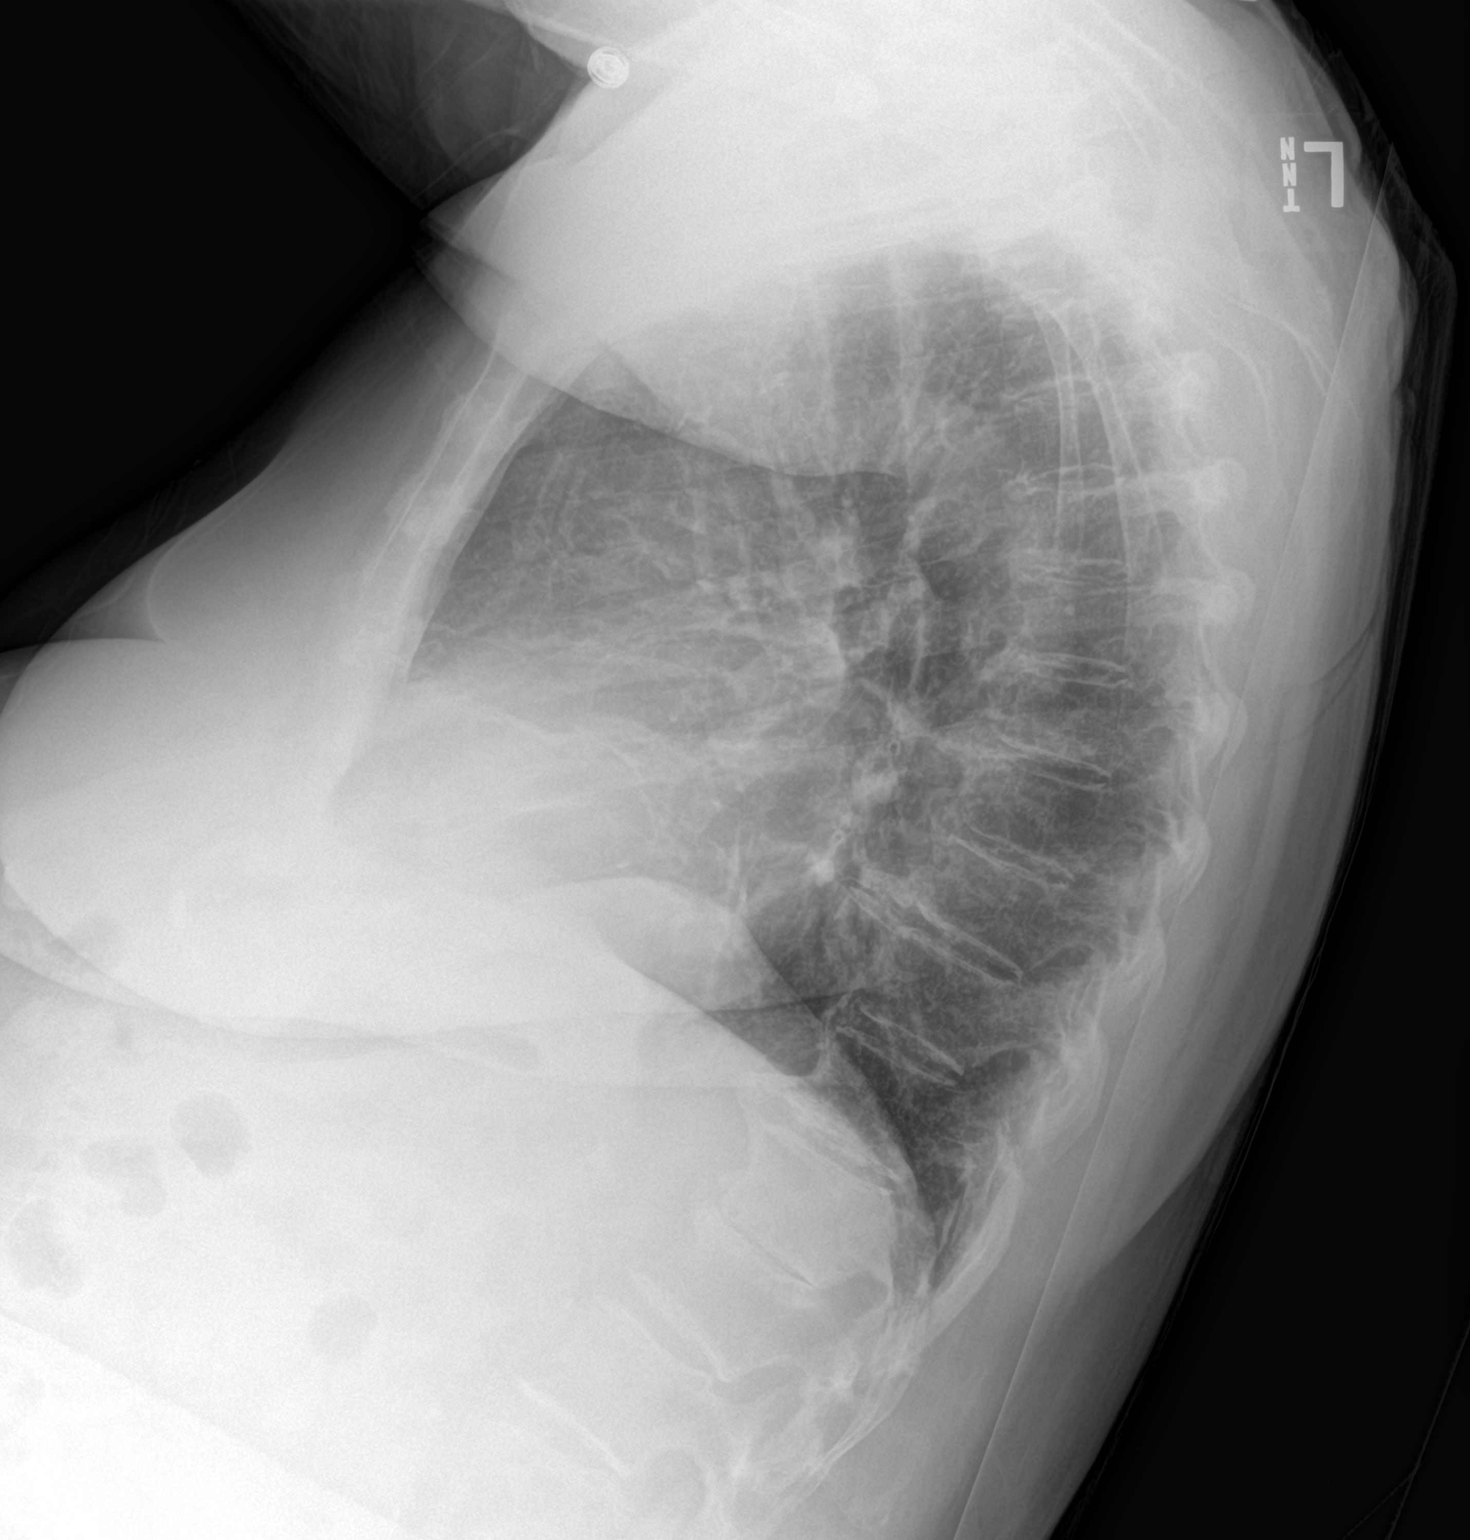

[chest ap]
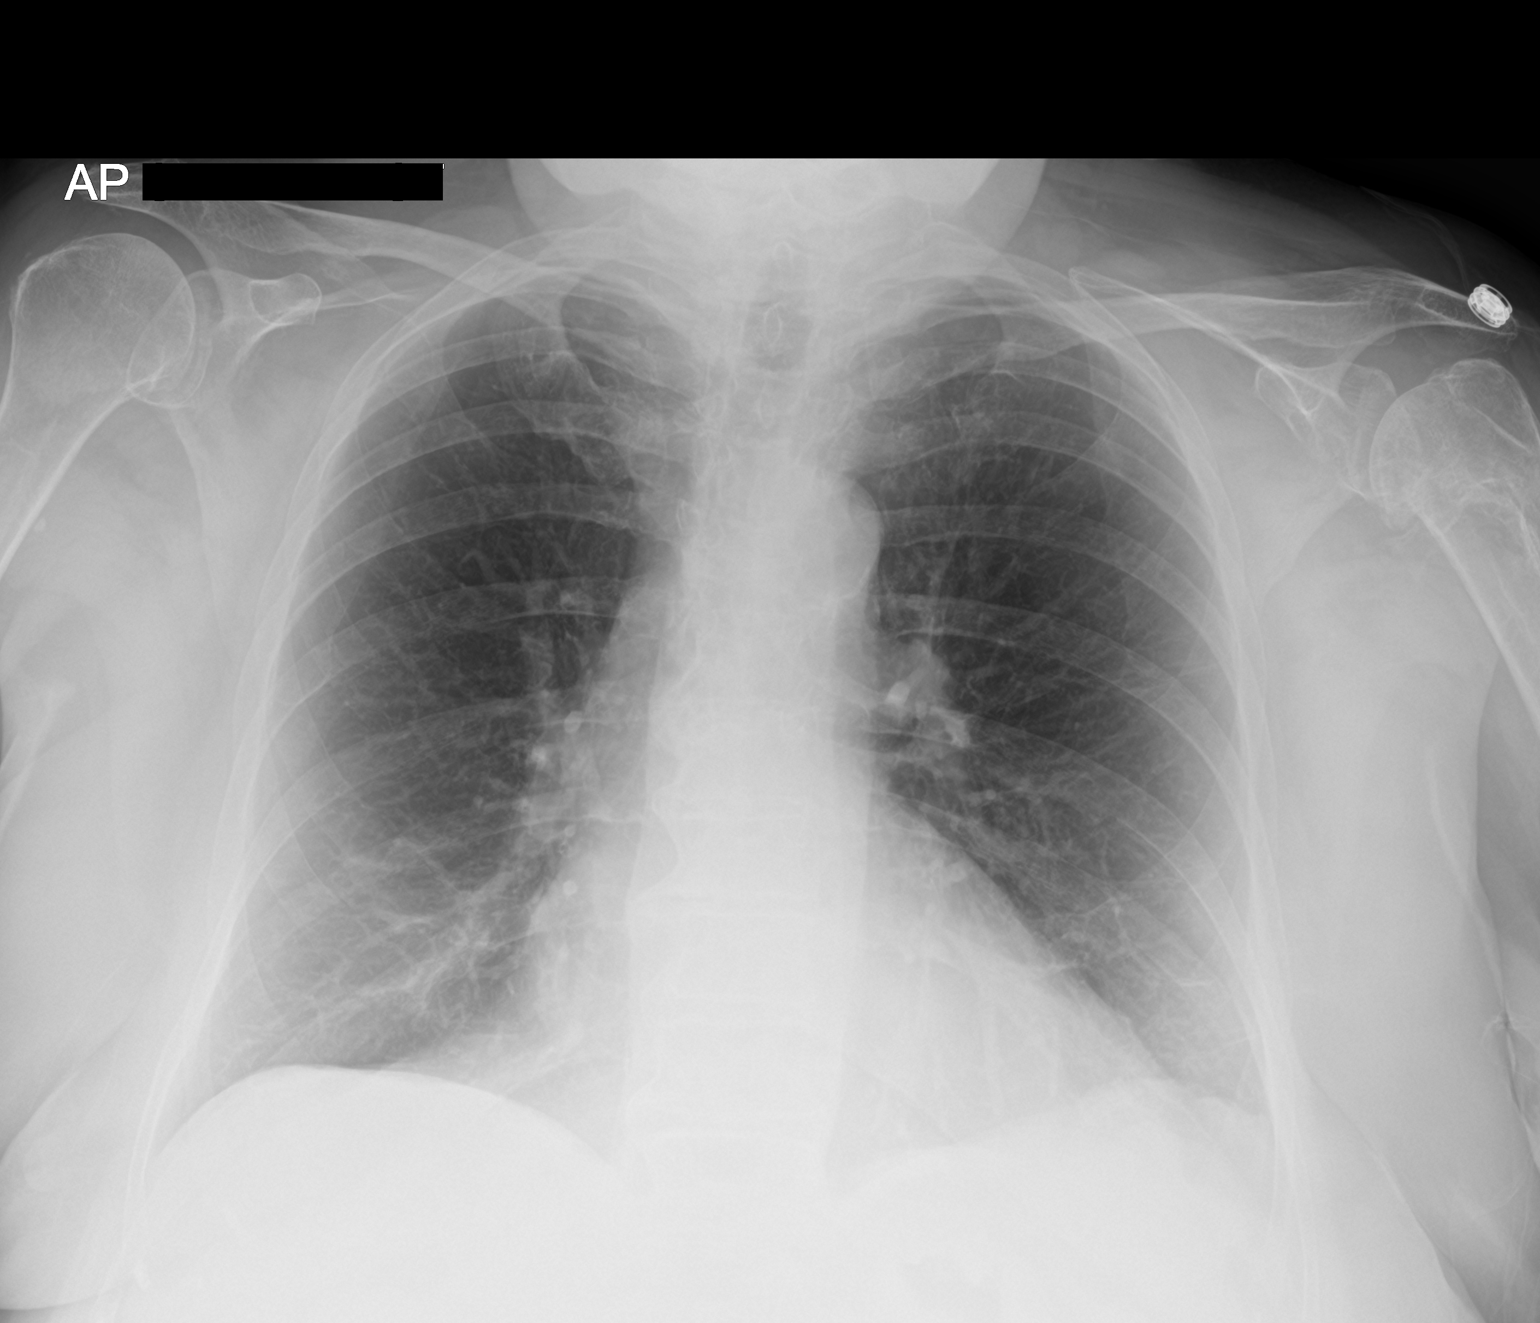

[2 of 2 positions shown; findings below may reference images not displayed]

FINDINGS: Cardiomediastinal silhouette is stable. Linear scarring in lingula
is stable. Stable streaky scarring in right middle lobe. No definite
superimposed infiltrate or pulmonary edema. Mild degenerative
changes thoracolumbar spine.
IMPRESSION: Linear scarring in lingula is stable. Stable streaky scarring in
right middle lobe. No definite superimposed infiltrate or pulmonary
edema.

## 2016-11-17 ENCOUNTER — Encounter: Payer: Self-pay | Admitting: Internal Medicine

## 2016-11-18 ENCOUNTER — Other Ambulatory Visit: Payer: Self-pay | Admitting: Emergency Medicine

## 2016-11-18 MED ORDER — DULOXETINE HCL 30 MG PO CPEP: ORAL_CAPSULE | ORAL | 1 refills | Status: DC

## 2016-12-04 ENCOUNTER — Other Ambulatory Visit: Payer: Self-pay | Admitting: Internal Medicine

## 2016-12-04 IMAGING — DX DG CHEST 2V
2 series · 2 of 2 positions shown · non-contrast
Comparison: Chest x-ray of 05/23/2016

CLINICAL DATA: Chest tightness, cough, gallbladder surgery 1 month
ago

EXAM:
CHEST  2 VIEW

[chest pa]
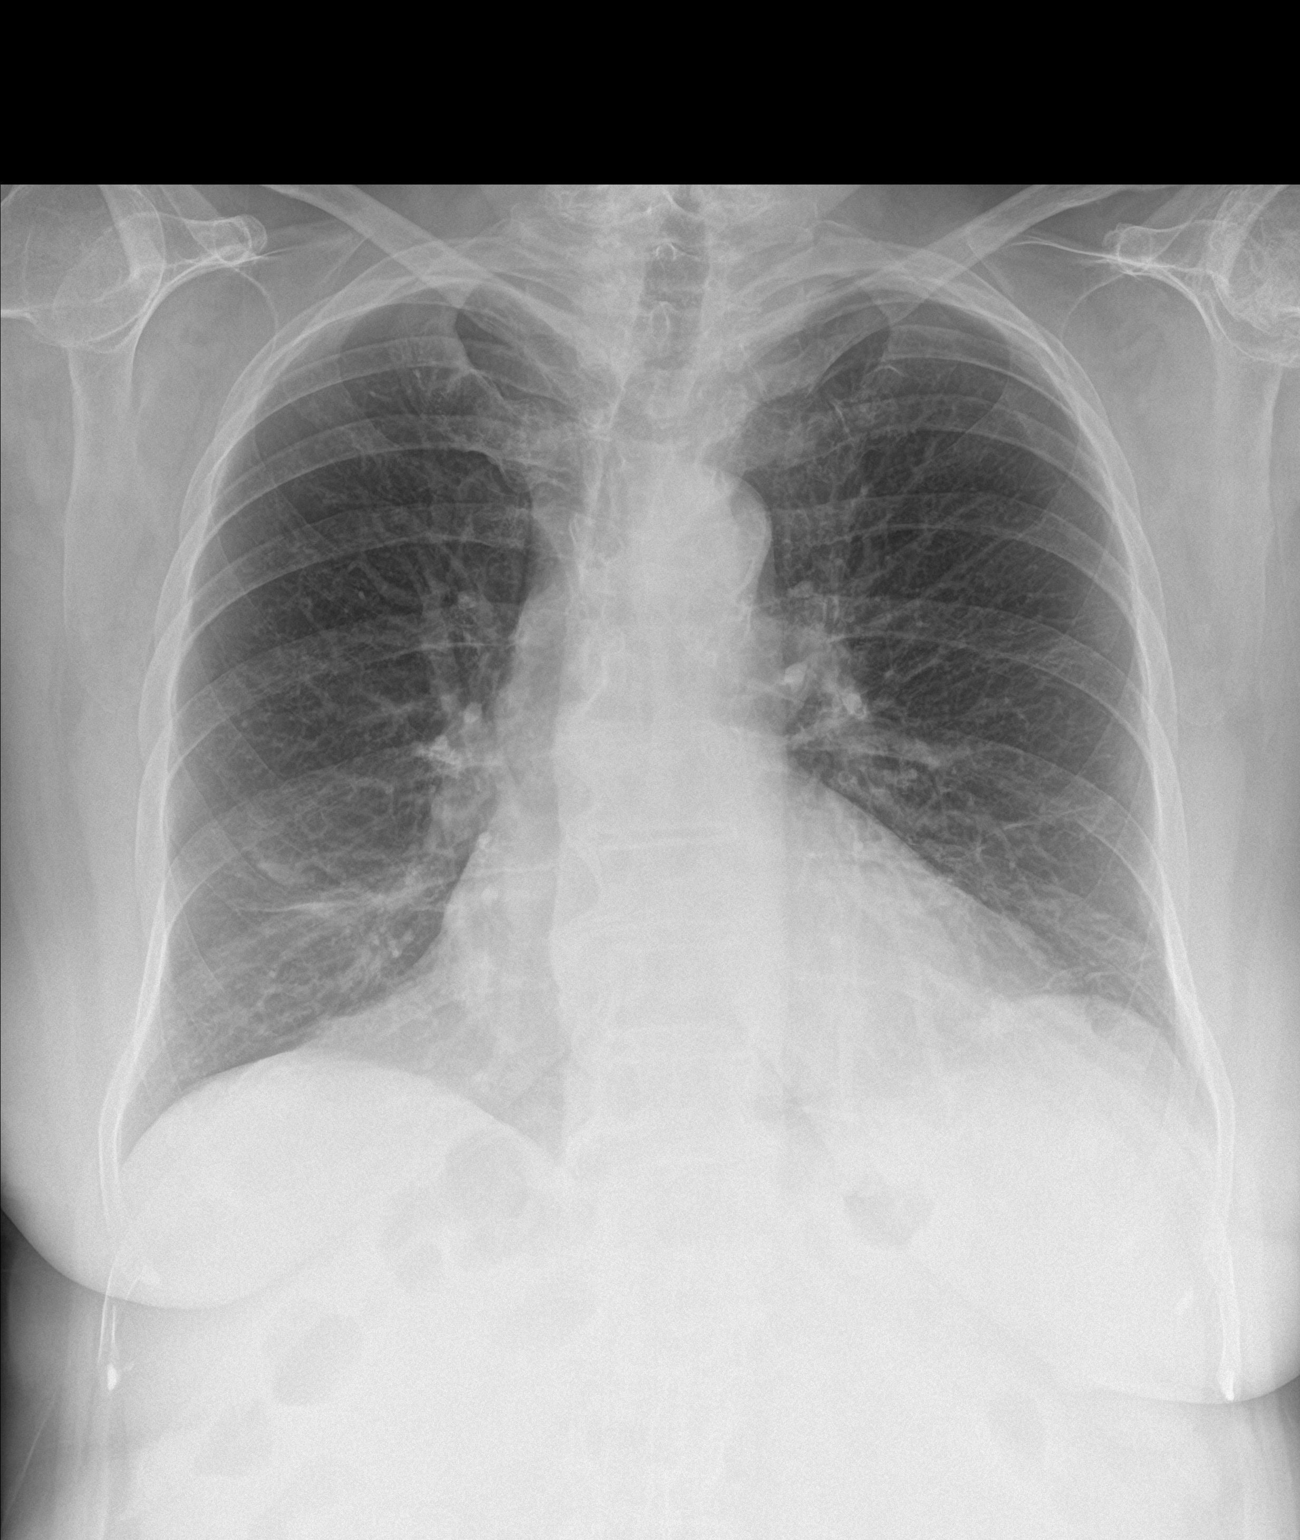

[chest lat]
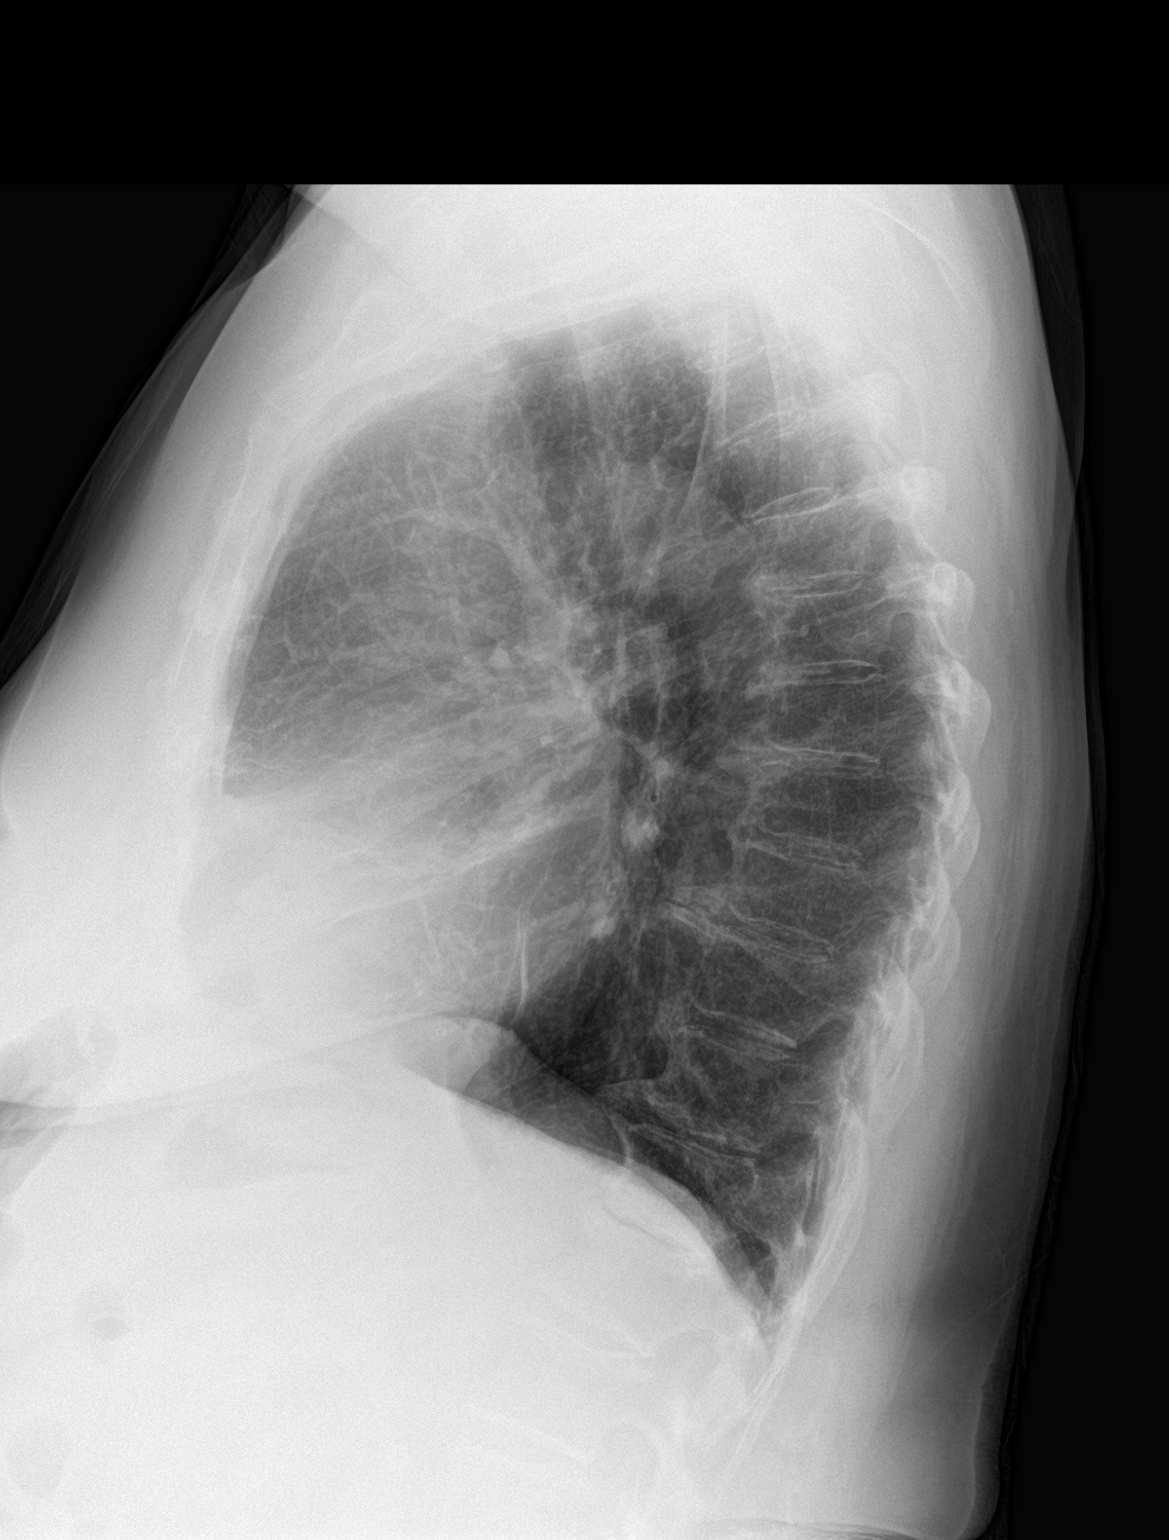

[2 of 2 positions shown; findings below may reference images not displayed]

FINDINGS: Linear scarring in the right middle lobe and possibly within the
lingula is stable. No infiltrate or effusion is seen mediastinal and
hilar contours are unremarkable. The heart is borderline enlarged
and stable. There are degenerative changes in the mid to lower
thoracic spine.
IMPRESSION: No active lung disease. Stable linear scarring in the right middle
lobe and possibly within the lingula.

## 2016-12-11 DIAGNOSIS — M810 Age-related osteoporosis without current pathological fracture: Secondary | ICD-10-CM | POA: Diagnosis not present

## 2016-12-11 DIAGNOSIS — M8589 Other specified disorders of bone density and structure, multiple sites: Secondary | ICD-10-CM | POA: Diagnosis not present

## 2016-12-11 DIAGNOSIS — N6489 Other specified disorders of breast: Secondary | ICD-10-CM | POA: Diagnosis not present

## 2016-12-11 DIAGNOSIS — S12112A Nondisplaced Type II dens fracture, initial encounter for closed fracture: Secondary | ICD-10-CM | POA: Diagnosis not present

## 2016-12-11 LAB — HM MAMMOGRAPHY

## 2016-12-11 LAB — HM DEXA SCAN

## 2016-12-12 ENCOUNTER — Other Ambulatory Visit: Payer: Self-pay | Admitting: Neurological Surgery

## 2016-12-12 DIAGNOSIS — S12112A Nondisplaced Type II dens fracture, initial encounter for closed fracture: Secondary | ICD-10-CM

## 2016-12-18 ENCOUNTER — Encounter: Payer: Self-pay | Admitting: Internal Medicine

## 2016-12-22 ENCOUNTER — Encounter: Payer: Self-pay | Admitting: Internal Medicine

## 2016-12-22 DIAGNOSIS — M810 Age-related osteoporosis without current pathological fracture: Secondary | ICD-10-CM | POA: Insufficient documentation

## 2016-12-23 ENCOUNTER — Ambulatory Visit
Admission: RE | Admit: 2016-12-23 | Discharge: 2016-12-23 | Disposition: A | Discharge status: Home / Self Care | Payer: Medicare Other | Source: Ambulatory Visit | Attending: Neurological Surgery | Admitting: Neurological Surgery

## 2016-12-23 ENCOUNTER — Other Ambulatory Visit: Payer: Self-pay | Admitting: Neurological Surgery

## 2016-12-23 DIAGNOSIS — M545 Low back pain: Secondary | ICD-10-CM | POA: Diagnosis not present

## 2016-12-23 DIAGNOSIS — S12112A Nondisplaced Type II dens fracture, initial encounter for closed fracture: Secondary | ICD-10-CM

## 2016-12-23 DIAGNOSIS — M79605 Pain in left leg: Secondary | ICD-10-CM

## 2016-12-23 MED ORDER — IOPAMIDOL (ISOVUE-M 200) INJECTION 41%
1.0000 mL | Freq: Once | INTRAMUSCULAR | Status: AC
Start: 2016-12-23 — End: 2016-12-23
  Administered 2016-12-23: 1 mL via EPIDURAL

## 2016-12-23 MED ORDER — METHYLPREDNISOLONE ACETATE 40 MG/ML INJ SUSP (RADIOLOG
120.0000 mg | Freq: Once | INTRAMUSCULAR | Status: AC
  Administered 2016-12-23: 120 mg via EPIDURAL

## 2016-12-23 NOTE — Discharge Instructions (Signed)

## 2016-12-25 ENCOUNTER — Encounter: Payer: Self-pay | Admitting: Internal Medicine

## 2017-01-12 ENCOUNTER — Other Ambulatory Visit: Payer: Self-pay | Admitting: Internal Medicine

## 2017-01-13 NOTE — Telephone Encounter (Signed)
Routing to dr burns, ordered by Rexene Edison, NP last refill, but dr burns has seen patient for hypertension in 08/2016----are you ok with refilling, please advise, thanks

## 2017-01-26 ENCOUNTER — Telehealth: Payer: Self-pay | Admitting: Internal Medicine

## 2017-01-26 MED ORDER — CIPROFLOXACIN HCL 250 MG PO TABS: 250.0000 mg | ORAL_TABLET | Freq: Two times a day (BID) | ORAL | 0 refills | Status: DC

## 2017-01-26 NOTE — Telephone Encounter (Signed)
cipro sent - she should f/u tomorrow as scheduled.

## 2017-01-26 NOTE — Telephone Encounter (Signed)
Pt called in and would like cipro called in for her uti.  She said that she has an appt tomorrow. She wanted it called in RIGHT NOW.  She has called 3 times already this morning.  I explain to her that Dr was in clinical and it is normally a 24 to 68 turn around time.  She got upset and said that she wanted it today.  She said that Dr Quay Burow has done it 2 times before.  I told her that I would send message and have nurse to give her a call as soon has she was able to.

## 2017-01-26 NOTE — Telephone Encounter (Signed)
Please advise, pt is coming in tomorrow for an appt.

## 2017-01-26 NOTE — Telephone Encounter (Signed)
Spoke with pt to inform. Pt states she has a hard tim given a urine specimen on demand and would like to come by in the morning and get a urine specimen up.

## 2017-01-27 ENCOUNTER — Ambulatory Visit: Payer: Medicare Other | Admitting: Internal Medicine

## 2017-01-30 ENCOUNTER — Ambulatory Visit (INDEPENDENT_AMBULATORY_CARE_PROVIDER_SITE_OTHER): Payer: Medicare Other | Admitting: Family

## 2017-01-30 ENCOUNTER — Encounter: Payer: Self-pay | Admitting: Family

## 2017-01-30 DIAGNOSIS — S91302A Unspecified open wound, left foot, initial encounter: Secondary | ICD-10-CM

## 2017-01-30 DIAGNOSIS — S91309A Unspecified open wound, unspecified foot, initial encounter: Secondary | ICD-10-CM | POA: Insufficient documentation

## 2017-01-30 MED ORDER — MUPIROCIN 2 % EX OINT: 1.0000 "application " | TOPICAL_OINTMENT | Freq: Every day | CUTANEOUS | 0 refills | Status: DC

## 2017-01-30 MED ORDER — FLUCONAZOLE 150 MG PO TABS: 150.0000 mg | ORAL_TABLET | Freq: Once | ORAL | 0 refills | Status: AC

## 2017-01-30 NOTE — Assessment & Plan Note (Addendum)
Small open wound of heel with no obvious sign of infection and apparent small hematoma. At risk for healing complications secondary to co-morbid conditions and age. Treat conservatively with basic wound care including soap and water. Start bactroban as needed. Cover with bandage. Encouraged to increase protein. Continue to monitor and follow up if wound does not heal.

## 2017-01-30 NOTE — Patient Instructions (Signed)
Thank you for choosing [facility].  SUMMARY AND INSTRUCTIONS:  Keep wound clean with soap and water. Cover with bandage.   Bactroban daily with a very small dose.   Increase protein intake.   Medication:  Your prescription(s) have been submitted to your pharmacy or been printed and provided for you. Please take as directed and contact our office if you believe you are having problem(s) with the medication(s) or have any questions.  Follow up:  If your symptoms worsen or fail to improve, please contact our office for further instruction, or in case of emergency go directly to the emergency room at the closest medical facility.
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
## 2017-02-10 ENCOUNTER — Encounter: Payer: Self-pay | Admitting: Pulmonary Disease

## 2017-02-10 ENCOUNTER — Ambulatory Visit (INDEPENDENT_AMBULATORY_CARE_PROVIDER_SITE_OTHER): Payer: Medicare Other | Admitting: Pulmonary Disease

## 2017-02-10 VITALS — BP 122/80 | HR 85 | Ht 60.0 in | Wt 177.4 lb

## 2017-02-10 DIAGNOSIS — J8281 Chronic eosinophilic pneumonia: Secondary | ICD-10-CM

## 2017-02-10 DIAGNOSIS — J45998 Other asthma: Secondary | ICD-10-CM

## 2017-02-10 DIAGNOSIS — J82 Pulmonary eosinophilia, not elsewhere classified: Secondary | ICD-10-CM

## 2017-02-10 MED ORDER — PREDNISONE 5 MG PO TABS: 5.0000 mg | ORAL_TABLET | Freq: Every day | ORAL | 5 refills | Status: DC

## 2017-02-10 MED ORDER — MOMETASONE FUROATE 200 MCG/ACT IN AERO: 2.0000 | INHALATION_SPRAY | Freq: Every day | RESPIRATORY_TRACT | 0 refills | Status: DC

## 2017-02-10 NOTE — Progress Notes (Signed)
Patient seen in the office today and instructed on use of Asmanex HFA 250mcg.   Patient expressed understanding and demonstrated technique. Patient was given two samples of Asmanex HFA 240mcg --okayed per Dr Halford Chessman.  This is the same medication was the Asmanex Twisthaler 250mcg, just a different delivery method.  Patient took today's dose in the room while being observed and coached on technique.  Virl Cagey, CMA

## 2017-02-10 NOTE — Addendum Note (Signed)
Addended by: Virl Cagey on: 02/10/2017 12:52 PM   Modules accepted: Orders

## 2017-02-10 NOTE — Patient Instructions (Signed)
Prednisone 5 mg daily  Call if symptoms get worse  Follow up in 4 months

## 2017-02-10 NOTE — Progress Notes (Signed)
Current Outpatient Prescriptions on File Prior to Visit  Medication Sig  . acetaminophen (TYLENOL) 325 MG tablet Take 325 mg by mouth every 6 (six) hours as needed (For pain.).  Marland Kitchen albuterol (PROAIR HFA) 108 (90 BASE) MCG/ACT inhaler Inhale 2 puffs into the lungs every 6 (six) hours as needed. (Patient taking differently: Inhale 2 puffs into the lungs every 6 (six) hours as needed for wheezing or shortness of breath. )  . benzonatate (TESSALON) 200 MG capsule Take 1 capsule (200 mg total) by mouth 3 (three) times daily as needed for cough.  . calcium carbonate (OS-CAL) 600 MG TABS tablet Take 600 mg by mouth daily.   . Cetirizine HCl 10 MG CAPS Take 1 capsule (10 mg total) by mouth daily.  . Cholecalciferol (VITAMIN D3) 2000 UNITS capsule Take 2,000 Units by mouth daily.  . DULoxetine (CYMBALTA) 30 MG capsule TAKE ONE CAPSULE BY MOUTH DIALY  . fluticasone (FLONASE) 50 MCG/ACT nasal spray Place 2 sprays into both nostrils daily.  Marland Kitchen gabapentin (NEURONTIN) 100 MG capsule Take 1 capsule (100 mg total) by mouth 3 (three) times daily. (Patient taking differently: Take 300 mg by mouth 3 (three) times daily. )  . LORazepam (ATIVAN) 1 MG tablet TAKE 1/2 TABLET BY MOUTH EVERY 8 TO 12 HOURS AS NEEDED  . losartan (COZAAR) 100 MG tablet TAKE 1 TABLET(100 MG) BY MOUTH DAILY  . mometasone (ASMANEX 60 METERED DOSES) 220 MCG/INH inhaler Inhale 2 puffs into the lungs daily.  . mupirocin ointment (BACTROBAN) 2 % Apply 1 application topically daily.  Marland Kitchen omeprazole (PRILOSEC) 20 MG capsule Take 1 capsule (20 mg total) by mouth daily.  . Probiotic Product (PHILLIPS COLON HEALTH PO) Take 1 capsule by mouth daily as needed (For overall digestive health.).   Marland Kitchen thiamine (VITAMIN B-1) 100 MG tablet Take 100 mg by mouth daily as needed (For energy - takes only during the winter months.).    No current facility-administered medications on file prior to visit.      Chief Complaint  Patient presents with  . Follow-up    Pt  denies any issues with breathing since last OV.      Pulmonary tests CBC 01/14/11>>39% eosinophils 01/14/11>> HIV negative, ACE 39, RF negative, ANA 1:40, ANCA negative  CT chest 01/16/11>>Multifocal b/l ASD with GGO BAL 01/21/11>>48% Eosinophils  IgE 02/25/11>>282 CT chest 07/22/11>>resolution of ASD CT chest 10/03/11>>recurrence of nodular infiltrate Rt upper lobe PFT 11/04/12>>FEV1 1.77 (87%), FEV1% 68, TLC 4.87 (101%), DLCO 69%, +BD from FEF 25-75%.  Past medical history HTN, GERD, HLD, Depression, Anxiety, Type 2 odontoid fx January 2016, b/l hearing loss  Past surgical history, Family history, Social history all reviewed  Vital Signs BP 122/80 (BP Location: Left Arm, Cuff Size: Normal)   Pulse 85   Ht 5' (1.524 m)   Wt 177 lb 6.4 oz (80.5 kg)   SpO2 100%   BMI 34.65 kg/m   History of Present Illness JASMENE DROZE is a 76 y.o. female former smoker with eosinophilic pneumonia, asthma.  Her breathing has been doing well.  She is not having cough, wheeze, sputum, or fever.  She is worried about weight gain.  She has generalized joint pain.  Neurosurgery is concerned that her C2 injury might be progressing.    Physical Exam  General - pleasant ENT - no sinus tenderness, no oral exudate, no LAN Cardiac - regular, no murmur Chest - no wheeze, rales Back - non tender Abd - soft, non tender Ext -  no edema Neuro - normal strength Skin - shiny, pink scale along scalp line Psych - normal mood  Assessment/Plan  Eosinophilic pneumonia. - will have her decrease prednisone to 5 mg daily - she typically hasn't been able to tolerate lower doses - repeat CBC with Diff and CXR if symptoms progress after dose reduction  Persistent asthma. - continue asmanex and prn proair  Allergic rhinitis. - continue flonase  Rash on scalp. - looks like psoriasis - advised her to discuss with her PCP; she was seen by dermatology previously, and could f/u with dermatology for further  assessment   Patient Instructions  Prednisone 5 mg daily  Call if symptoms get worse  Follow up in 4 months    [name], MD [place] [facility]/Sleep Pager:  [phone] 02/10/2017, 11:33 AM

## 2017-02-12 DIAGNOSIS — N3020 Other chronic cystitis without hematuria: Secondary | ICD-10-CM | POA: Diagnosis not present

## 2017-02-13 ENCOUNTER — Encounter: Payer: Self-pay | Admitting: Emergency Medicine

## 2017-02-19 ENCOUNTER — Ambulatory Visit (INDEPENDENT_AMBULATORY_CARE_PROVIDER_SITE_OTHER): Payer: Medicare Other

## 2017-02-19 ENCOUNTER — Encounter (INDEPENDENT_AMBULATORY_CARE_PROVIDER_SITE_OTHER): Payer: Self-pay | Admitting: Orthopedic Surgery

## 2017-02-19 ENCOUNTER — Ambulatory Visit (INDEPENDENT_AMBULATORY_CARE_PROVIDER_SITE_OTHER): Payer: Medicare Other | Admitting: Orthopedic Surgery

## 2017-02-19 DIAGNOSIS — M25511 Pain in right shoulder: Secondary | ICD-10-CM
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
## 2017-03-04 DIAGNOSIS — S46011D Strain of muscle(s) and tendon(s) of the rotator cuff of right shoulder, subsequent encounter: Secondary | ICD-10-CM | POA: Diagnosis not present

## 2017-03-06 DIAGNOSIS — S46011D Strain of muscle(s) and tendon(s) of the rotator cuff of right shoulder, subsequent encounter: Secondary | ICD-10-CM | POA: Diagnosis not present

## 2017-03-13 DIAGNOSIS — S46011D Strain of muscle(s) and tendon(s) of the rotator cuff of right shoulder, subsequent encounter: Secondary | ICD-10-CM | POA: Diagnosis not present

## 2017-03-17 DIAGNOSIS — S46011D Strain of muscle(s) and tendon(s) of the rotator cuff of right shoulder, subsequent encounter: Secondary | ICD-10-CM | POA: Diagnosis not present

## 2017-03-19 ENCOUNTER — Encounter (INDEPENDENT_AMBULATORY_CARE_PROVIDER_SITE_OTHER): Payer: Self-pay | Admitting: Orthopedic Surgery

## 2017-03-19 ENCOUNTER — Ambulatory Visit (INDEPENDENT_AMBULATORY_CARE_PROVIDER_SITE_OTHER): Payer: Medicare Other

## 2017-03-19 ENCOUNTER — Ambulatory Visit (INDEPENDENT_AMBULATORY_CARE_PROVIDER_SITE_OTHER): Payer: Medicare Other | Admitting: Orthopedic Surgery

## 2017-03-19 DIAGNOSIS — M25511 Pain in right shoulder: Secondary | ICD-10-CM | POA: Diagnosis not present

## 2017-03-19 DIAGNOSIS — M25561 Pain in right knee: Secondary | ICD-10-CM
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
## 2017-03-23 DIAGNOSIS — S46011D Strain of muscle(s) and tendon(s) of the rotator cuff of right shoulder, subsequent encounter: Secondary | ICD-10-CM | POA: Diagnosis not present

## 2017-03-24 ENCOUNTER — Ambulatory Visit (INDEPENDENT_AMBULATORY_CARE_PROVIDER_SITE_OTHER): Payer: Medicare Other | Admitting: Internal Medicine

## 2017-03-24 ENCOUNTER — Other Ambulatory Visit (INDEPENDENT_AMBULATORY_CARE_PROVIDER_SITE_OTHER): Payer: Medicare Other

## 2017-03-24 ENCOUNTER — Encounter: Payer: Self-pay | Admitting: Internal Medicine

## 2017-03-24 VITALS — BP 130/80 | HR 88 | Temp 97.8°F | Ht 60.0 in | Wt 175.1 lb

## 2017-03-24 DIAGNOSIS — F411 Generalized anxiety disorder: Secondary | ICD-10-CM

## 2017-03-24 DIAGNOSIS — K219 Gastro-esophageal reflux disease without esophagitis: Secondary | ICD-10-CM

## 2017-03-24 DIAGNOSIS — M2550 Pain in unspecified joint: Secondary | ICD-10-CM

## 2017-03-24 DIAGNOSIS — R413 Other amnesia: Secondary | ICD-10-CM | POA: Diagnosis not present

## 2017-03-24 DIAGNOSIS — S91302D Unspecified open wound, left foot, subsequent encounter: Secondary | ICD-10-CM

## 2017-03-24 DIAGNOSIS — M810 Age-related osteoporosis without current pathological fracture: Secondary | ICD-10-CM

## 2017-03-24 DIAGNOSIS — I10 Essential (primary) hypertension: Secondary | ICD-10-CM

## 2017-03-24 DIAGNOSIS — R739 Hyperglycemia, unspecified: Secondary | ICD-10-CM | POA: Diagnosis not present

## 2017-03-24 DIAGNOSIS — F3289 Other specified depressive episodes: Secondary | ICD-10-CM | POA: Diagnosis not present

## 2017-03-24 LAB — CBC WITH DIFFERENTIAL/PLATELET
Basophils Absolute: 0.1 10*3/uL (ref 0.0–0.1)
Basophils Relative: 0.6 % (ref 0.0–3.0)
Eosinophils Absolute: 0.4 10*3/uL (ref 0.0–0.7)
Eosinophils Relative: 4.1 % (ref 0.0–5.0)
HCT: 41.5 % (ref 36.0–46.0)
Hemoglobin: 13.7 g/dL (ref 12.0–15.0)
Lymphocytes Relative: 11.9 % — ABNORMAL LOW (ref 12.0–46.0)
Lymphs Abs: 1.1 10*3/uL (ref 0.7–4.0)
MCHC: 33 g/dL (ref 30.0–36.0)
MCV: 101.5 fl — ABNORMAL HIGH (ref 78.0–100.0)
Monocytes Absolute: 1 10*3/uL (ref 0.1–1.0)
Monocytes Relative: 10.8 % (ref 3.0–12.0)
Neutro Abs: 6.8 10*3/uL (ref 1.4–7.7)
Neutrophils Relative %: 72.6 % (ref 43.0–77.0)
Platelets: 238 10*3/uL (ref 150.0–400.0)
RBC: 4.09 Mil/uL (ref 3.87–5.11)
RDW: 13.9 % (ref 11.5–15.5)
WBC: 9.4 10*3/uL (ref 4.0–10.5)

## 2017-03-24 LAB — COMPREHENSIVE METABOLIC PANEL
ALT: 41 U/L — ABNORMAL HIGH (ref 0–35)
AST: 50 U/L — ABNORMAL HIGH (ref 0–37)
Albumin: 4.1 g/dL (ref 3.5–5.2)
Alkaline Phosphatase: 71 U/L (ref 39–117)
BUN: 12 mg/dL (ref 6–23)
CO2: 29 mEq/L (ref 19–32)
Calcium: 9.6 mg/dL (ref 8.4–10.5)
Chloride: 101 mEq/L (ref 96–112)
Creatinine, Ser: 0.79 mg/dL (ref 0.40–1.20)
GFR: 75.63 mL/min (ref >60.00)
Glucose, Bld: 128 mg/dL — ABNORMAL HIGH (ref 70–99)
Potassium: 4.4 mEq/L (ref 3.5–5.1)
Sodium: 136 mEq/L (ref 135–145)
Total Bilirubin: 0.5 mg/dL (ref 0.2–1.2)
Total Protein: 7 g/dL (ref 6.0–8.3)

## 2017-03-24 LAB — HEMOGLOBIN A1C: Hgb A1c MFr Bld: 5.4 % (ref 4.6–6.5)

## 2017-03-24 LAB — LIPID PANEL
Cholesterol: 190 mg/dL (ref 0–200)
HDL: 68.5 mg/dL (ref >39.00)
LDL Cholesterol: 103 mg/dL — ABNORMAL HIGH (ref 0–99)
NonHDL: 121.78
Total CHOL/HDL Ratio: 3
Triglycerides: 95 mg/dL (ref 0.0–149.0)
VLDL: 19 mg/dL (ref 0.0–40.0)

## 2017-03-24 LAB — TSH: TSH: 3.13 u[IU]/mL (ref 0.35–4.50)

## 2017-03-24 LAB — VITAMIN B12: Vitamin B-12: 463 pg/mL (ref 211–911)

## 2017-03-24 LAB — VITAMIN D 25 HYDROXY (VIT D DEFICIENCY, FRACTURES): VITD: 42.29 ng/mL (ref 30.00–100.00)

## 2017-03-24 MED ORDER — BETAMETHASONE VALERATE 0.12 % EX FOAM: CUTANEOUS | 0 refills | Status: DC

## 2017-03-24 MED ORDER — GABAPENTIN 300 MG PO CAPS: 300.0000 mg | ORAL_CAPSULE | Freq: Three times a day (TID) | ORAL | 3 refills | Status: DC

## 2017-03-24 NOTE — Assessment & Plan Note (Signed)
Still with anxiety  Continue current medications Will see a therapist - [facility]

## 2017-03-24 NOTE — Assessment & Plan Note (Signed)
Check tsh, b12

## 2017-03-24 NOTE — Assessment & Plan Note (Addendum)
Has had fractures On chronic steroids for eosinophilic PNA Advised taking calcium and vit d daily Check vitamin D daily will look into prolia given GERD and GI symtpoms

## 2017-03-24 NOTE — Assessment & Plan Note (Signed)
BP Readings from Last 3 Encounters:  03/24/17 130/80  02/10/17 122/80  01/30/17 126/90   BP well controlled Current regimen effective and well tolerated Continue current medications at current doses Blood work today

## 2017-03-24 NOTE — Assessment & Plan Note (Signed)
Check a1c 

## 2017-03-24 NOTE — Progress Notes (Signed)
Pre visit review using our clinic review tool, if applicable. No additional management support is needed unless otherwise documented below in the visit note. 

## 2017-03-24 NOTE — Patient Instructions (Addendum)
Increase your tylenol for your pain - do not exceed 3 grams (3000 mg ) in one day.  Make an appointment with a therapist below - if you need a referral let me know.    [facility] [place], [place]  We will look into prolia for your osteoporosis - we will call you regarding this.   Test(s) ordered today. Your results will be released to [facility] (or called to you) after review, usually within 72hours after test completion. If any changes need to be made, you will be notified at that same time.  All other Health Maintenance issues reviewed.   All recommended immunizations and age-appropriate screenings are up-to-date or discussed.  No immunizations administered today.   Medications reviewed and updated.   No changes recommended at this time.  Your prescription(s) have been submitted to your pharmacy. Please take as directed and contact our office if you believe you are having problem(s) with the medication(s).   Please followup in 6 months

## 2017-03-24 NOTE — Assessment & Plan Note (Signed)
Increase tylenol - take up to 3000 mg daily

## 2017-03-24 NOTE — Assessment & Plan Note (Signed)
Continue current medication Will see a therapist - [facility]

## 2017-03-24 NOTE — Assessment & Plan Note (Signed)
GERD controlled Continue daily medication  

## 2017-03-24 NOTE — Assessment & Plan Note (Signed)
Scabbed over, no open wound No pain, redness, warmth - no evidence of an infection Continue local wound care and monitor closely
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
## 2017-03-26 ENCOUNTER — Ambulatory Visit (INDEPENDENT_AMBULATORY_CARE_PROVIDER_SITE_OTHER): Payer: Medicare Other | Admitting: Physician Assistant

## 2017-03-26 ENCOUNTER — Telehealth: Payer: Self-pay

## 2017-03-26 ENCOUNTER — Encounter: Payer: Self-pay | Admitting: Internal Medicine

## 2017-03-26 ENCOUNTER — Encounter: Payer: Self-pay | Admitting: Physician Assistant

## 2017-03-26 VITALS — BP 118/78 | HR 96 | Ht 60.0 in | Wt 175.4 lb

## 2017-03-26 DIAGNOSIS — S46011D Strain of muscle(s) and tendon(s) of the rotator cuff of right shoulder, subsequent encounter: Secondary | ICD-10-CM | POA: Diagnosis not present

## 2017-03-26 DIAGNOSIS — Z8719 Personal history of other diseases of the digestive system: Secondary | ICD-10-CM

## 2017-03-26 DIAGNOSIS — R1310 Dysphagia, unspecified: Secondary | ICD-10-CM

## 2017-03-26 NOTE — Telephone Encounter (Signed)
Lisa Larson has submitted prolia information into patient's insurance to get summary of benefits for injections---[name] will call patient back after I receive copay/coverage results

## 2017-03-26 NOTE — Progress Notes (Signed)
Subjective:    Patient ID: Lisa Larson, female    DOB: Sep 26, 1941, 76 y.o.   MRN: [id number]  HPI Lisa Larson is a pleasant 76 year old white female known to Dr. Ardis Hughs. She has multiple medical issues including hypertension, asthma, GERD, depression, IBS, COPD, and regular EtOH use of about 4 glasses of wine daily. She status post cholecystectomy in 2017. She was last seen in our office in 2016 with complaints of epigastric pain felt to be secondary to biliary colic. Last colon was done July 2014 with finding of 2 sessile polyps which were tubular adenomas. She will be due for 5 year interval follow-up. Last EGD in 2008 with finding of acute gastritis there is no definite stricture but she did have some luminal narrowing and was dilated #48. Patient comes in today with a couple of concerns. Sounds as if she had an acute gastroenteritis about 10 days ago with acute nausea vomiting and diarrhea which lasted for several hours and then resolved.   She is having difficulty with dysphagia, and to pills and sometimes with solids. She states "something is wrong and there". She denies any regular heartburn or indigestion. She had been taking omeprazole 20 mg daily, add her recent visit with PCP was asked to take it every other day. She is also concerned about some intermittent fecal soilage. This is been going on for a while area she says sometimes when she urinates a small amount of stool, mouth and makes it very difficult for her to clean herself. She has had some other occasions of what sounds like partial incontinence but these are very infrequent. She has no complaints of ongoing abdominal pain and no changes in her bowel habits usually with 2-3 formed stools per day. She has been using probiotics. She has not seen any blood.  Review of Systems Pertinent positive and negative review of systems were noted in the above HPI section.  All other review of systems was otherwise negative.  Outpatient  Encounter Prescriptions as of 03/26/2017  Medication Sig  . acetaminophen (TYLENOL) 325 MG tablet Take 325 mg by mouth every 6 (six) hours as needed (For pain.).  Marland Kitchen albuterol (PROAIR HFA) 108 (90 BASE) MCG/ACT inhaler Inhale 2 puffs into the lungs every 6 (six) hours as needed. (Patient taking differently: Inhale 2 puffs into the lungs every 6 (six) hours as needed for wheezing or shortness of breath. )  . benzonatate (TESSALON) 200 MG capsule Take 1 capsule (200 mg total) by mouth 3 (three) times daily as needed for cough.  . Betamethasone Valerate 0.12 % foam Apply to scalp twice daily as needed  . calcium carbonate (OS-CAL) 600 MG TABS tablet Take 600 mg by mouth daily.   . Cetirizine HCl 10 MG CAPS Take 1 capsule (10 mg total) by mouth daily.  . Cholecalciferol (VITAMIN D3) 2000 UNITS capsule Take 2,000 Units by mouth daily.  . DULoxetine (CYMBALTA) 30 MG capsule TAKE ONE CAPSULE BY MOUTH DIALY  . fluticasone (FLONASE) 50 MCG/ACT nasal spray Place 2 sprays into both nostrils daily.  Marland Kitchen gabapentin (NEURONTIN) 300 MG capsule Take 1 capsule (300 mg total) by mouth 3 (three) times daily.  Marland Kitchen LORazepam (ATIVAN) 1 MG tablet TAKE 1/2 TABLET BY MOUTH EVERY 8 TO 12 HOURS AS NEEDED  . losartan (COZAAR) 100 MG tablet TAKE 1 TABLET(100 MG) BY MOUTH DAILY  . mometasone (ASMANEX 60 METERED DOSES) 220 MCG/INH inhaler Inhale 2 puffs into the lungs daily.  . mupirocin ointment (BACTROBAN) 2 %  Apply 1 application topically daily.  Marland Kitchen omeprazole (PRILOSEC) 20 MG capsule Take 1 capsule (20 mg total) by mouth daily.  . predniSONE (DELTASONE) 5 MG tablet Take 1 tablet (5 mg total) by mouth daily with breakfast.  . Probiotic Product (PHILLIPS COLON HEALTH PO) Take 1 capsule by mouth daily as needed (For overall digestive health.).   Marland Kitchen thiamine (VITAMIN B-1) 100 MG tablet Take 100 mg by mouth daily as needed (For energy - takes only during the winter months.).   Marland Kitchen Mometasone Furoate (ASMANEX HFA) 200 MCG/ACT AERO Inhale  2 puffs into the lungs daily.   No facility-administered encounter medications on file as of 03/26/2017.    Allergies  Allergen Reactions  . Sulfonamide Derivatives Shortness Of Breath and Rash  . Oxycodone-Aspirin Other (See Comments)    Couldn't hear   . Diclofenac Other (See Comments)    Unknown reaction   . Rofecoxib Other (See Comments)    Unknown reaction   . Ace Inhibitors Cough  . Benazepril Hcl Other (See Comments)    No PMH of angioedema; ACE-I caused cough  . Tramadol Itching   Patient Active Problem List   Diagnosis Date Noted  . Open wound of heel 01/30/2017  . Osteoporosis 12/22/2016  . Family history of diabetes mellitus 09/22/2016  . Chest tightness 06/11/2016  . Cough 06/11/2016  . UTI (urinary tract infection) 05/03/2016  . Memory changes 06/27/2015  . Chest pain 03/24/2015  . Chronic pain disorder 03/24/2015  . Depression 03/24/2015  . Post concussion syndrome 02/06/2015  . Spine pain, multilevel 02/06/2015  . Arthralgia of multiple sites, bilateral 01/18/2014  . Allergic rhinitis 04/17/2011  . EOSINOPHILIC PNEUMONIA 47/82/9562  . Hyperglycemia 11/28/2010  . Asthma, persistent controlled 11/28/2010  . GOUT, UNSPECIFIED 05/04/2009  . Hyperlipidemia 09/29/2008  . Anxiety state 09/29/2008  . HYPERTRIGLYCERIDEMIA 08/09/2008  . Essential hypertension 08/09/2008  . GERD 09/29/2007  . Osteoarthritis 06/09/2007   Social History   Social History  . Marital status: Widowed    Spouse name: N/A  . Number of children: 2  . Years of education: N/A   Occupational History  . Retired, Production designer, theatre/television/film work    Social History Main Topics  . Smoking status: Former Smoker    Packs/day: 1.00    Years: 15.00    Types: Cigarettes    Quit date: 12/29/1968  . Smokeless tobacco: Never Used     Comment: smoked 1966- ? 1970, up to 1 ppd  . Alcohol use 16.8 oz/week    28 Glasses of wine per week     Comment: 4 a day or more  . Drug use: No  . Sexual activity: Yes     Birth control/ protection: Surgical   Other Topics Concern  . Not on file   Social History Narrative   Lives alone.  Has a son and a daughter who help with her care.  Ambulates with a cane.    Lisa Larson's family history includes Alzheimer's disease in her mother; Arthritis in her father; Cancer in her son; Colitis in her mother; Diabetes in her brother and paternal grandmother; Heart attack in her paternal grandmother; Hypertension in her brother, father, and mother; Irritable bowel syndrome in her mother; Non-Hodgkin's lymphoma in her brother; Other in her son; Pulmonary embolism in her father; Rheum arthritis in her father.      Objective:    Vitals:   03/26/17 1332  BP: 118/78  Pulse: 96    Physical Exam  well-developed elderly  white female in no acute distress, blood pressure 118/78 pulse 96 height 5 foot, weight 175, BMI of 34.2. HEENT; nontraumatic normocephalic EOMI PERRLA sclera anicteric, Cardiovascular; regular rate and rhythm with S1-S2 no murmur rub or gallop, Pulmonary; somewhat decreased breath sounds bilaterally, Abdomen; obese ,soft no focal tenderness no guarding or rebound, bowel sounds are present, Rectal; exam, stool is Hemoccult negative, sphincter tone fairly normal no lesion noted. Extremities; no clubbing cyanosis or edema skin warm and dry she ambulates with difficulty with cane, Neuropsych; mood and affect appropriate       Assessment & Plan:   #16 76 year old white female with complaints of dysphagia to pills and intermittently with solids. Prior history of an empiric dilation 2008. Rule out recurrent esophageal stricture. #2 GERD-stable #3 history of adenomatous colon polyps-up-to-date with colonoscopy will be due for follow-up 2019 #4 fecal soilage-no complete incontinence, adequate sphincter tone #5 status post cholecystectomy #6 COPD #7 asthma #8 regular EtOH use  Plan; Labs reviewed from recent visit with primary and reviewed with patient. She  has a mild transaminitis  likely secondary to EtOH. He will resume omeprazole at least 20 mg by mouth every other day , Add Benefiber one scoop daily in an 8 ounce glass of fluid to bulk stool and hopefully decrease incidence of fecal soilage. If symptoms progress, we'll consider anorectal manometry When follow-up colonoscopy with Dr. Ardis Hughs July 2019. Will schedule for EGD with probable dilation with Dr. Ardis Hughs. Procedure discussed with patient in detail including risks and benefits and she is agreeable to proceed. We also discussed her EtOH use-overuse and she was advised no more than 2 drinks per day at most, and for women general recommendation is no more than 1 drink per day.[facility] Nurse Samar Venneman PA-C 03/26/2017   Cc: Binnie Rail, MD

## 2017-03-26 NOTE — Patient Instructions (Signed)
Take your omeprazole 20 mg every other day.   Start Benefiber supplement over the counter one dose daily in glass of water to soften stool.   Continue your Probiotic daily.  You have been scheduled for an endoscopy. Please follow written instructions given to you at your visit today. If you use inhalers (even only as needed), please bring them with you on the day of your procedure. Your physician has requested that you go to www.startemmi.com and enter the access code given to you at your visit today. This web site gives a general overview about your procedure. However, you should still follow specific instructions given to you by our office regarding your preparation for the procedure.  You will be due for a recall colonoscopy in 06/2018. We will send you a reminder in the mail when it gets closer to that time.

## 2017-03-27 MED ORDER — METHYLPREDNISOLONE ACETATE 40 MG/ML IJ SUSP
40.0000 mg | INTRAMUSCULAR | Status: AC | PRN
  Administered 2017-03-19: 40 mg via INTRA_ARTICULAR

## 2017-03-27 MED ORDER — LIDOCAINE HCL 1 % IJ SOLN
5.0000 mL | INTRAMUSCULAR | Status: AC | PRN
  Administered 2017-03-19: 5 mL

## 2017-03-27 MED ORDER — BUPIVACAINE HCL 0.25 % IJ SOLN
4.0000 mL | INTRAMUSCULAR | Status: AC | PRN
  Administered 2017-03-19: 4 mL via INTRA_ARTICULAR

## 2017-03-27 NOTE — Progress Notes (Signed)
I agree with the above note, plan 

## 2017-04-02 ENCOUNTER — Ambulatory Visit (INDEPENDENT_AMBULATORY_CARE_PROVIDER_SITE_OTHER): Payer: Medicare Other | Admitting: Orthopedic Surgery

## 2017-04-07 DIAGNOSIS — S46011D Strain of muscle(s) and tendon(s) of the rotator cuff of right shoulder, subsequent encounter: Secondary | ICD-10-CM | POA: Diagnosis not present

## 2017-04-09 ENCOUNTER — Telehealth: Payer: Self-pay

## 2017-04-09 NOTE — Telephone Encounter (Signed)
Left message advising patient to call back to discuss summary of benefits for prolia injections---can talk with Jaquan Sadowsky----estimated $250 copay---this will be her first injection, she can schedule on nurse visit at her earliest convenience

## 2017-04-14 DIAGNOSIS — S46011D Strain of muscle(s) and tendon(s) of the rotator cuff of right shoulder, subsequent encounter: Secondary | ICD-10-CM | POA: Diagnosis not present

## 2017-04-16 ENCOUNTER — Telehealth: Payer: Self-pay | Admitting: Internal Medicine

## 2017-04-16 NOTE — Telephone Encounter (Signed)
Called patient to schedule awv. Lvm for patient to call office to schedule appt.  °

## 2017-04-23 DIAGNOSIS — S46011D Strain of muscle(s) and tendon(s) of the rotator cuff of right shoulder, subsequent encounter: Secondary | ICD-10-CM | POA: Diagnosis not present

## 2017-04-27 DIAGNOSIS — S46011D Strain of muscle(s) and tendon(s) of the rotator cuff of right shoulder, subsequent encounter: Secondary | ICD-10-CM | POA: Diagnosis not present

## 2017-05-16 ENCOUNTER — Other Ambulatory Visit: Payer: Self-pay | Admitting: Internal Medicine

## 2017-05-21 DIAGNOSIS — H11821 Conjunctivochalasis, right eye: Secondary | ICD-10-CM | POA: Diagnosis not present

## 2017-05-26 ENCOUNTER — Telehealth: Payer: Self-pay

## 2017-05-26 NOTE — Telephone Encounter (Signed)
Patient does not want to start prolia at this time, estimated $250 copay----she is requesting that dr burns call in prescription strength Vitamin D for her to try instead---I am routing to dr burns for that request---also patient would like to have annual wellness visit scheduled, routing to [place] to have patient called one day next week (she is unavailable this week) to get scheduled with Jill/AWV coach----sheneka please call patient next week to schedule AWV----dr burns, please advise on Vitamin D rx request, I will call patient back, thanks

## 2017-05-26 NOTE — Telephone Encounter (Signed)
Her vitamin d level was 42 in march of 2018 - high dose vitamin d is not indicated and will not help.  She should continue her 2000 units daily of vitamin d.    We can discuss other options for the osteoporosis at her next visit.

## 2017-05-27 NOTE — Telephone Encounter (Signed)
Patient would like to get prolia injection, but can't afford copay---I have answered all prolia questions and she states she's ok with information, does not need OV with dr burns, tamara will be checking into patient assistance funding with margaret/prolia next week and will call patient back after we find out if any funding is available

## 2017-05-28 ENCOUNTER — Telehealth: Payer: Self-pay | Admitting: Gastroenterology

## 2017-05-28 ENCOUNTER — Telehealth: Payer: Self-pay | Admitting: *Deleted

## 2017-05-28 NOTE — Telephone Encounter (Signed)
Returned patient's call. Patient questioned how far the tube goes down her throat for her upper endoscopy scheduled tomorrow. Patient instructed that the scope goes down to the esophagus, stomach and into the first portion of the small intestine. Patient verbalizes understanding, says she was just curious, and has no more questions at this time. Patient instructed to call us back if she has any further questions or concerns about her care.

## 2017-05-29 ENCOUNTER — Ambulatory Visit (AMBULATORY_SURGERY_CENTER): Payer: Medicare Other | Admitting: Gastroenterology

## 2017-05-29 ENCOUNTER — Encounter: Payer: Self-pay | Admitting: Gastroenterology

## 2017-05-29 VITALS — BP 124/67 | HR 76 | Temp 98.0°F | Resp 21 | Ht 60.0 in | Wt 175.0 lb

## 2017-05-29 DIAGNOSIS — K449 Diaphragmatic hernia without obstruction or gangrene: Secondary | ICD-10-CM | POA: Diagnosis not present

## 2017-05-29 DIAGNOSIS — I10 Essential (primary) hypertension: Secondary | ICD-10-CM | POA: Diagnosis not present

## 2017-05-29 DIAGNOSIS — K222 Esophageal obstruction: Secondary | ICD-10-CM

## 2017-05-29 DIAGNOSIS — Z8719 Personal history of other diseases of the digestive system: Secondary | ICD-10-CM | POA: Diagnosis not present

## 2017-05-29 DIAGNOSIS — R1310 Dysphagia, unspecified: Secondary | ICD-10-CM | POA: Diagnosis not present

## 2017-05-29 MED ORDER — OMEPRAZOLE 40 MG PO CPDR: 40.0000 mg | DELAYED_RELEASE_CAPSULE | Freq: Every day | ORAL | 3 refills | Status: DC

## 2017-05-29 MED ORDER — SODIUM CHLORIDE 0.9 % IV SOLN: 500.0000 mL | INTRAVENOUS | Status: DC

## 2017-05-29 NOTE — Progress Notes (Signed)
Report to PACU, RN, vss, BBS= Clear.  

## 2017-05-29 NOTE — Op Note (Signed)
[facility] Patient Name: Lisa Larson Procedure Date: 05/29/2017 9:34 AM MRN: [id number] Endoscopist: Milus Banister , MD Age: 76 Referring MD:  Date of Birth: 1941/09/13 Gender: Female Account #: [id number] Procedure:                Upper GI endoscopy Indications:              Dysphagia Medicines:                Monitored Anesthesia Care Procedure:                Pre-Anesthesia Assessment:                           - Prior to the procedure, a History and Physical                            was performed, and patient medications and                            allergies were reviewed. The patient's tolerance of                            previous anesthesia was also reviewed. The risks                            and benefits of the procedure and the sedation                            options and risks were discussed with the patient.                            All questions were answered, and informed consent                            was obtained. Prior Anticoagulants: The patient has                            taken no previous anticoagulant or antiplatelet                            agents. ASA Grade Assessment: II - A patient with                            mild systemic disease. After reviewing the risks                            and benefits, the patient was deemed in                            satisfactory condition to undergo the procedure.                           After obtaining informed consent, the endoscope was  passed under direct vision. Throughout the                            procedure, the patient's blood pressure, pulse, and                            oxygen saturations were monitored continuously. The                            Endoscope was introduced through the mouth, and                            advanced to the duodenal bulb. The upper GI                            endoscopy was accomplished without difficulty.  The                            patient tolerated the procedure well. Scope In: Scope Out: Findings:                 One mild benign-appearing, intrinsic stenosis                            (slightly inflammed focal peptic stricture) was                            found at the gastroesophageal junction. A TTS                            dilator was passed through the scope. Dilation with                            an 18-19-20 mm balloon dilator was performed to 18                            mm. There was the usual superficial mucosal tear                            and self limited oozing of blood following dilation.                           A small hiatal hernia was present.                           The exam was otherwise without abnormality. Complications:            No immediate complications. Estimated blood loss:                            None. Estimated Blood Loss:     Estimated blood loss: none. Impression:               - Benign-appearing esophageal stenosis. Dilated.                           -  Small hiatal hernia.                           - The examination was otherwise normal.                           - No specimens collected. Recommendation:           - Patient has a contact number available for                            emergencies. The signs and symptoms of potential                            delayed complications were discussed with the                            patient. Return to normal activities tomorrow.                            Written discharge instructions were provided to the                            patient.                           - Resume previous diet.                           - Please increase to prescription strength antiacid                            medicine; new sript written today for omeprazole                            40mg  pill, take one pill 20-30 min prior to BF meal                            daily.                           -  Repeat upper endoscopy PRN for retreatment. Milus Banister, MD 05/29/2017 10:04:47 AM This report has been signed electronically.

## 2017-05-29 NOTE — Patient Instructions (Signed)
YOU HAD AN ENDOSCOPIC PROCEDURE TODAY AT [facility]:   Refer to the procedure report that was given to you for any specific questions about what was found during the examination.  If the procedure report does not answer your questions, please call your gastroenterologist to clarify.  If you requested that your care partner not be given the details of your procedure findings, then the procedure report has been included in a sealed envelope for you to review at your convenience later.  YOU SHOULD EXPECT: Some feelings of bloating in the abdomen. Passage of more gas than usual.  Walking can help get rid of the air that was put into your GI tract during the procedure and reduce the bloating. If you had a lower endoscopy (such as a colonoscopy or flexible sigmoidoscopy) you may notice spotting of blood in your stool or on the toilet paper. If you underwent a bowel prep for your procedure, you may not have a normal bowel movement for a few days.  Please Note:  You might notice some irritation and congestion in your nose or some drainage.  This is from the oxygen used during your procedure.  There is no need for concern and it should clear up in a day or so.  SYMPTOMS TO REPORT IMMEDIATELY:   Following upper endoscopy (EGD)  Vomiting of blood or coffee ground material  New chest pain or pain under the shoulder blades  Painful or persistently difficult swallowing  New shortness of breath  Fever of 100F or higher  Black, tarry-looking stools  For urgent or emergent issues, a gastroenterologist can be reached at any hour by calling [phone].   DIET: Please follow post dilation diet sheet for today but then you may proceed to your regular diet.  Drink plenty of fluids but you should avoid alcoholic beverages for 24 hours.  ACTIVITY:  You should plan to take it easy for the rest of today and you should NOT DRIVE or use heavy machinery until tomorrow (because of the sedation  medicines used during the test).    FOLLOW UP: Our staff will call the number listed on your records the next business day following your procedure to check on you and address any questions or concerns that you may have regarding the information given to you following your procedure. If we do not reach you, we will leave a message.  However, if you are feeling well and you are not experiencing any problems, there is no need to return our call.  We will assume that you have returned to your regular daily activities without incident.  If any biopsies were taken you will be contacted by phone or by letter within the next 1-3 weeks.  Please call us at [phone] if you have not heard about the biopsies in 3 weeks.    SIGNATURES/CONFIDENTIALITY: You and/or your care partner have signed paperwork which will be entered into your electronic medical record.  These signatures attest to the fact that that the information above on your After Visit Summary has been reviewed and is understood.  Full responsibility of the confidentiality of this discharge information lies with you and/or your care-partner.  Thank you for letting us take care of your healthcare needs today.
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
## 2017-05-29 NOTE — Progress Notes (Signed)
Called to room to assist during endoscopic procedure.  Patient ID and intended procedure confirmed with present staff. Received instructions for my participation in the procedure from the performing physician.  

## 2017-06-01 ENCOUNTER — Encounter: Payer: Self-pay | Admitting: Internal Medicine

## 2017-06-01 ENCOUNTER — Telehealth: Payer: Self-pay | Admitting: *Deleted

## 2017-06-01 NOTE — Telephone Encounter (Signed)
  Follow up Call-  Call back number 05/29/2017  Post procedure Call Back phone  # [phone]  Permission to leave phone message Yes  Some recent data might be hidden     Patient questions:  Do you have a fever, pain , or abdominal swelling? No. Pain Score  0 *  Have you tolerated food without any problems? Yes.    Have you been able to return to your normal activities? Yes.    Do you have any questions about your discharge instructions: Diet   No. Medications  No. Follow up visit  No.  Do you have questions or concerns about your Care? No.  Actions: * If pain score is 4 or above: No action needed, pain <4.

## 2017-06-03 ENCOUNTER — Telehealth: Payer: Self-pay

## 2017-06-03 MED ORDER — FLUOCINOLONE ACETONIDE 0.01 % EX SHAM: MEDICATED_SHAMPOO | CUTANEOUS | 3 refills | Status: DC

## 2017-06-03 NOTE — Telephone Encounter (Signed)
Patient's copay for prolia injections is estimated $250---patient is contacting [employer] to see if she qualifies for assistance with covering copay cost---patient will call Ji Fairburn back to let her know if she qualifies and how the program will be responding to paying these costs---can talk with Rayshad Riviello if she calls back

## 2017-06-04 ENCOUNTER — Telehealth: Payer: Self-pay | Admitting: Emergency Medicine

## 2017-06-04 NOTE — Telephone Encounter (Signed)
Received call from pharmacy stating that pts insurance wouldn't cover scalp shampoo but would cover scalp oil.

## 2017-06-11 ENCOUNTER — Telehealth: Payer: Self-pay | Admitting: Internal Medicine

## 2017-06-11 NOTE — Telephone Encounter (Signed)
Pt has questions about prolia injections. Would like for you to give her a call.

## 2017-06-14 ENCOUNTER — Encounter: Payer: Self-pay | Admitting: Internal Medicine

## 2017-06-15 ENCOUNTER — Encounter: Payer: Self-pay | Admitting: Family Medicine

## 2017-06-15 ENCOUNTER — Ambulatory Visit (INDEPENDENT_AMBULATORY_CARE_PROVIDER_SITE_OTHER): Payer: Medicare Other | Admitting: Family Medicine

## 2017-06-15 VITALS — BP 126/88 | HR 87 | Temp 98.0°F | Wt 172.5 lb

## 2017-06-15 DIAGNOSIS — B372 Candidiasis of skin and nail: Secondary | ICD-10-CM | POA: Diagnosis not present

## 2017-06-15 DIAGNOSIS — R21 Rash and other nonspecific skin eruption: Secondary | ICD-10-CM | POA: Diagnosis not present

## 2017-06-15 MED ORDER — NYSTATIN 100000 UNIT/GM EX POWD: Freq: Three times a day (TID) | CUTANEOUS | 0 refills | Status: DC

## 2017-06-15 MED ORDER — PREDNISONE 10 MG PO TABS: ORAL_TABLET | ORAL | 0 refills | Status: DC
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
## 2017-06-15 NOTE — Patient Instructions (Signed)
Try cool showers I have sent in new prednisone prescription as well as for a powder to use I have put in a dermatology referral- you should get a call this week about an appointment

## 2017-06-18 NOTE — Telephone Encounter (Signed)
Patient is needing to speak with [name] about the [employer]. She has some questions she needs answered.  What is the medication called she will be taking.  What is the reason for needing the medication.  If you could please follow up with patient. Thank you.

## 2017-06-19 NOTE — Telephone Encounter (Signed)
I have discussed all questions about prolia with patient, she will call me back with her decision

## 2017-06-23 ENCOUNTER — Encounter: Payer: Self-pay | Admitting: Pulmonary Disease

## 2017-06-23 ENCOUNTER — Ambulatory Visit (INDEPENDENT_AMBULATORY_CARE_PROVIDER_SITE_OTHER): Payer: Medicare Other | Admitting: Pulmonary Disease

## 2017-06-23 VITALS — BP 118/80 | HR 96 | Ht 60.0 in | Wt 172.4 lb

## 2017-06-23 DIAGNOSIS — J45998 Other asthma: Secondary | ICD-10-CM

## 2017-06-23 DIAGNOSIS — F418 Other specified anxiety disorders: Secondary | ICD-10-CM | POA: Diagnosis not present

## 2017-06-23 DIAGNOSIS — J8281 Chronic eosinophilic pneumonia: Secondary | ICD-10-CM

## 2017-06-23 DIAGNOSIS — J82 Pulmonary eosinophilia, not elsewhere classified: Secondary | ICD-10-CM | POA: Diagnosis not present

## 2017-06-23 DIAGNOSIS — R21 Rash and other nonspecific skin eruption: Secondary | ICD-10-CM

## 2017-06-23 MED ORDER — MOMETASONE FUROATE 200 MCG/ACT IN AERO: 2.0000 | INHALATION_SPRAY | Freq: Every day | RESPIRATORY_TRACT | 0 refills | Status: DC

## 2017-06-23 NOTE — Patient Instructions (Signed)
Follow up in 6 months 

## 2017-06-23 NOTE — Progress Notes (Signed)
Current Outpatient Prescriptions on File Prior to Visit  Medication Sig  . acetaminophen (TYLENOL) 325 MG tablet Take 325 mg by mouth every 6 (six) hours as needed (For pain.).  Marland Kitchen albuterol (PROAIR HFA) 108 (90 BASE) MCG/ACT inhaler Inhale 2 puffs into the lungs every 6 (six) hours as needed. (Patient taking differently: Inhale 2 puffs into the lungs every 6 (six) hours as needed for wheezing or shortness of breath. )  . Betamethasone Valerate 0.12 % foam Apply to scalp twice daily as needed  . calcium carbonate (OS-CAL) 600 MG TABS tablet Take 600 mg by mouth daily.   . Cetirizine HCl 10 MG CAPS Take 1 capsule (10 mg total) by mouth daily.  . Cholecalciferol (VITAMIN D3) 2000 UNITS capsule Take 2,000 Units by mouth daily.  . DULoxetine (CYMBALTA) 30 MG capsule TAKE 1 CAPSULE BY MOUTH EVERY DAY  . Fluocinolone Acetonide 0.01 % SHAM Apply daily.  Leave on for 5 minutes then rinse.  . fluticasone (FLONASE) 50 MCG/ACT nasal spray Place 2 sprays into both nostrils daily.  Marland Kitchen gabapentin (NEURONTIN) 300 MG capsule Take 1 capsule (300 mg total) by mouth 3 (three) times daily.  Marland Kitchen LORazepam (ATIVAN) 1 MG tablet TAKE 1/2 TABLET BY MOUTH EVERY 8 TO 12 HOURS AS NEEDED  . losartan (COZAAR) 100 MG tablet TAKE 1 TABLET(100 MG) BY MOUTH DAILY  . mometasone (ASMANEX 60 METERED DOSES) 220 MCG/INH inhaler Inhale 2 puffs into the lungs daily.  Marland Kitchen nystatin (MYCOSTATIN/NYSTOP) powder Apply topically 3 (three) times daily.  Marland Kitchen omeprazole (PRILOSEC) 40 MG capsule Take 1 capsule (40 mg total) by mouth daily.  . predniSONE (DELTASONE) 10 MG tablet Take 4 tablets x 2 days, then 3 x 2 days, then 2 x 2 days, then 1 x 2 then resume your 5 mg tablets  . predniSONE (DELTASONE) 5 MG tablet Take 1 tablet (5 mg total) by mouth daily with breakfast.  . Probiotic Product (Benoit) Take 1 capsule by mouth daily as needed (For overall digestive health.).   Marland Kitchen thiamine (VITAMIN B-1) 100 MG tablet Take 100 mg by mouth daily  as needed (For energy - takes only during the winter months.).   Marland Kitchen benzonatate (TESSALON) 200 MG capsule Take 1 capsule (200 mg total) by mouth 3 (three) times daily as needed for cough. (Patient not taking: Reported on 06/23/2017)  . Mometasone Furoate (ASMANEX HFA) 200 MCG/ACT AERO Inhale 2 puffs into the lungs daily.  . mupirocin ointment (BACTROBAN) 2 % Apply 1 application topically daily. (Patient not taking: Reported on 06/23/2017)   Current Facility-Administered Medications on File Prior to Visit  Medication  . 0.9 %  sodium chloride infusion     Chief Complaint  Patient presents with  . Follow-up    Pt here for folow up for Asthma. Pt asked about follow up chest XRay. Pt breathing is good at this time. Pt currently on predisone taper ( 10mg  two days left) for hives. Breathing is great on predisone. Pt uses inhalers when needed with asthma symptoms.      Pulmonary tests CBC 01/14/11>>39% eosinophils 01/14/11>> HIV negative, ACE 39, RF negative, ANA 1:40, ANCA negative  CT chest 01/16/11>>Multifocal b/l ASD with GGO BAL 01/21/11>>48% Eosinophils  IgE 02/25/11>>282 CT chest 07/22/11>>resolution of ASD CT chest 10/03/11>>recurrence of nodular infiltrate Rt upper lobe PFT 11/04/12>>FEV1 1.77 (87%), FEV1% 68, TLC 4.87 (101%), DLCO 69%, +BD from FEF 25-75%.  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 female former smoker with eosinophilic pneumonia, asthma.  She was confused about her depression and anxiety treatments.   She was seen by PCP and was started on higher dose prednisone for skin rash.  This has improved, but she is still  getting itching.  She also still has changes of psoriasis on her scalp. She is confused about how to use shampoo for her scalp psoriasis.  She was referred to dermatology, but appointment hasn't been scheduled yet.  She is not having cough, wheeze, sputum, sinus congestion, fever, leg swelling, or chest pain.    Physical Exam  General - pleasant Eyes - pupils reactive ENT - no sinus tenderness, no oral exudate, no LAN Cardiac - regular, no murmur Chest - no wheeze, rales Abd - soft, non tender Ext - no edema Skin - pink scales along scalp, areas of excoriation on trunk and upper back Neuro - normal strength Psych - normal mood   CBC    Component Value Date/Time   WBC 9.4 03/24/2017 1132   RBC 4.09 03/24/2017 1132   HGB 13.7 03/24/2017 1132   HCT 41.5 03/24/2017 1132   PLT 238.0 03/24/2017 1132   MCV 101.5 (H) 03/24/2017 1132   MCH 33.4 05/23/2016 1015   MCHC 33.0 03/24/2017 1132   RDW 13.9 03/24/2017 1132   LYMPHSABS 1.1 03/24/2017 1132   MONOABS 1.0 03/24/2017 1132   EOSABS 0.4 03/24/2017 1132   BASOSABS 0.1 03/24/2017 1132    Assessment/Plan  Eosinophilic pneumonia. - clinically stable - continue prednisone 5 mg daily - defer repeat labs, or CXR at this time  Allergic asthma. - stable on asmanex and prn proair  Allergic rhinitis. - continue flonase  Skin rash of uncertain cause. Psoriasis. - had extensive discussion about possible cause of her rash - clinically improved after recent prednisone burst - she is to be scheduled for dermatology appointment - advised her to discuss status of her psoriasis with dermatology also  Depression with anxiety. - had extensive discussion about the roles of her different medications for depression and anxiety - advised her to f/u with PCP  Time spent 27 minutes   Patient Instructions  Follow up in 6 months    [name], MD [place] [facility] Pager:  [phone] 06/23/2017, 11:46  AM

## 2017-06-25 ENCOUNTER — Telehealth: Payer: Self-pay | Admitting: Internal Medicine

## 2017-06-25 ENCOUNTER — Ambulatory Visit (INDEPENDENT_AMBULATORY_CARE_PROVIDER_SITE_OTHER): Payer: Medicare Other

## 2017-06-25 DIAGNOSIS — M810 Age-related osteoporosis without current pathological fracture: Secondary | ICD-10-CM

## 2017-06-25 DIAGNOSIS — F419 Anxiety disorder, unspecified: Secondary | ICD-10-CM

## 2017-06-25 MED ORDER — LORAZEPAM 1 MG PO TABS: ORAL_TABLET | ORAL | 0 refills | Status: DC

## 2017-06-25 MED ORDER — DENOSUMAB 60 MG/ML ~~LOC~~ SOLN
60.0000 mg | Freq: Once | SUBCUTANEOUS | Status: AC
Start: 2017-06-25 — End: 2017-06-25
  Administered 2017-06-25: 60 mg via SUBCUTANEOUS

## 2017-06-25 NOTE — Telephone Encounter (Signed)
Ok to fill.  rx printed 

## 2017-06-25 NOTE — Telephone Encounter (Signed)
RX faxed to [facility] 

## 2017-06-25 NOTE — Progress Notes (Signed)
prolia Injection given.   Kashawna Manzer J Glyn Zendejas, MD  

## 2017-06-25 NOTE — Telephone Encounter (Signed)
Pt came in asking for a refill on Lorazepam, (last written for pt on 9.25.17).

## 2017-06-30 ENCOUNTER — Telehealth: Payer: Self-pay

## 2017-06-30 NOTE — Telephone Encounter (Signed)
PA approved and pharmacy contacted

## 2017-07-06 ENCOUNTER — Ambulatory Visit (INDEPENDENT_AMBULATORY_CARE_PROVIDER_SITE_OTHER): Payer: Medicare Other

## 2017-07-06 ENCOUNTER — Ambulatory Visit (INDEPENDENT_AMBULATORY_CARE_PROVIDER_SITE_OTHER): Payer: Medicare Other | Admitting: Orthopedic Surgery

## 2017-07-06 DIAGNOSIS — M25512 Pain in left shoulder: Secondary | ICD-10-CM | POA: Diagnosis not present

## 2017-07-06 DIAGNOSIS — M25511 Pain in right shoulder: Secondary | ICD-10-CM | POA: Diagnosis not present

## 2017-07-06 DIAGNOSIS — M1711 Unilateral primary osteoarthritis, right knee: Secondary | ICD-10-CM | POA: Diagnosis not present

## 2017-07-06 DIAGNOSIS — G8929 Other chronic pain: Secondary | ICD-10-CM

## 2017-07-06 DIAGNOSIS — M1712 Unilateral primary osteoarthritis, left knee: Secondary | ICD-10-CM | POA: Diagnosis not present

## 2017-07-08 ENCOUNTER — Encounter (INDEPENDENT_AMBULATORY_CARE_PROVIDER_SITE_OTHER): Payer: Self-pay | Admitting: Orthopedic Surgery

## 2017-07-08 DIAGNOSIS — M1712 Unilateral primary osteoarthritis, left knee: Secondary | ICD-10-CM | POA: Diagnosis not present

## 2017-07-08 DIAGNOSIS — M1711 Unilateral primary osteoarthritis, right knee: Secondary | ICD-10-CM | POA: Diagnosis not present

## 2017-07-08 DIAGNOSIS — M25512 Pain in left shoulder: Secondary | ICD-10-CM | POA: Diagnosis not present

## 2017-07-08 DIAGNOSIS — G8929 Other chronic pain: Secondary | ICD-10-CM | POA: Diagnosis not present

## 2017-07-08 MED ORDER — BUPIVACAINE HCL 0.5 % IJ SOLN
9.0000 mL | INTRAMUSCULAR | Status: AC | PRN
  Administered 2017-07-08: 9 mL via INTRA_ARTICULAR

## 2017-07-08 MED ORDER — LIDOCAINE HCL 1 % IJ SOLN
5.0000 mL | INTRAMUSCULAR | Status: AC | PRN
  Administered 2017-07-08: 5 mL

## 2017-07-08 MED ORDER — METHYLPREDNISOLONE ACETATE 40 MG/ML IJ SUSP
40.0000 mg | INTRAMUSCULAR | Status: AC | PRN
  Administered 2017-07-08: 40 mg via INTRA_ARTICULAR

## 2017-07-08 MED ORDER — BUPIVACAINE HCL 0.25 % IJ SOLN
4.0000 mL | INTRAMUSCULAR | Status: AC | PRN
Start: 2017-07-08 — End: 2017-07-08
  Administered 2017-07-08: 4 mL via INTRA_ARTICULAR
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
## 2017-07-09 ENCOUNTER — Telehealth (INDEPENDENT_AMBULATORY_CARE_PROVIDER_SITE_OTHER): Payer: Self-pay | Admitting: Orthopedic Surgery

## 2017-07-09 NOTE — Telephone Encounter (Signed)
PT WOULD LIKE A CALL BACK REGARDING SELF EXERCISES SHE CAN DO PLEASE.  [phone]

## 2017-07-10 NOTE — Telephone Encounter (Signed)
Called s/w patient and advised.  

## 2017-07-10 NOTE — Telephone Encounter (Signed)
Pendulum and rotator cuff strengthening exercises for the shoulder and leg extension exercises and stationary bike for the knee

## 2017-07-10 NOTE — Telephone Encounter (Signed)
Patient would like to know if there are any home exercises she can do for knee pain.

## 2017-07-12 ENCOUNTER — Other Ambulatory Visit: Payer: Self-pay | Admitting: Internal Medicine

## 2017-07-23 ENCOUNTER — Ambulatory Visit (INDEPENDENT_AMBULATORY_CARE_PROVIDER_SITE_OTHER): Payer: Medicare Other | Admitting: *Deleted

## 2017-07-23 VITALS — BP 128/72 | HR 81 | Resp 20 | Ht 60.0 in | Wt 170.0 lb

## 2017-07-23 DIAGNOSIS — Z0000 Encounter for general adult medical examination without abnormal findings: Secondary | ICD-10-CM | POA: Diagnosis not present

## 2017-07-23 NOTE — Progress Notes (Signed)
Pre visit review using our clinic review tool, if applicable. No additional management support is needed unless otherwise documented below in the visit note. 
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
## 2017-07-23 NOTE — Patient Instructions (Addendum)
Continue to eat heart healthy diet (full of fruits, vegetables, whole grains, lean protein, water--limit salt, fat, and sugar intake) and increase physical activity as tolerated.  Continue doing brain stimulating activities (puzzles, reading, adult coloring books, staying active) to keep memory sharp.    Ms. Munroe , Thank you for taking time to come for your Medicare Wellness Visit. I appreciate your ongoing commitment to your health goals. Please review the following plan we discussed and let me know if I can assist you in the future.   These are the goals we discussed: Goals      Patient Stated   . patient (pt-stated)          Looking forward to having less pain and doing more      Other   . lose weight          Reduce the amount of wine, eat healthy, increase my exercise.        This is a list of the screening recommended for you and due dates:  Health Maintenance  Topic Date Due  . Flu Shot  07/29/2017  . Colon Cancer Screening  06/29/2018  . Mammogram  12/11/2018  . Tetanus Vaccine  04/05/2023  . DEXA scan (bone density measurement)  Completed  . Pneumonia vaccines  Completed    Ankle and wrist weights  Heating pad for neck pain   Web-site for free memory games: http://games.[email or web address].dk   It is important to avoid accidents which may result in broken bones.  Here are a few ideas on how to make your home safer so you will be less likely to trip or fall.  1. Use nonskid mats or non slip strips in your shower or tub, on your bathroom floor and around sinks.  If you know that you have spilled water, wipe it up! 2. In the bathroom, it is important to have properly installed grab bars on the walls or on the edge of the tub.  Towel racks are NOT strong enough for you to hold onto or to pull on for support. 3. Stairs and hallways should have enough light.  Add lamps or night lights if you need ore light. 4. It is good to have handrails on both sides of the  stairs if possible.  Always fix broken handrails right away. 5. It is important to see the edges of steps.  Paint the edges of outdoor steps white so you can see them better.  Put colored tape on the edge of inside steps. 6. Throw-rugs are dangerous because they can slide.  Removing the rugs is the best idea, but if they must stay, add adhesive carpet tape to prevent slipping. 7. Do not keep things on stairs or in the halls.  Remove small furniture that blocks the halls as it may cause you to trip.  Keep telephone and electrical cords out of the way where you walk. 8. Always were sturdy, rubber-soled shoes for good support.  Never wear just socks, especially on the stairs.  Socks may cause you to slip or fall.  Do not wear full-length housecoats as you can easily trip on the bottom.  9. Place the things you use the most on the shelves that are the easiest to reach.  If you use a stepstool, make sure it is in good condition.  If you feel unsteady, DO NOT climb, ask for help. 10. If a health professional advises you to use a cane or walker, do not be ashamed.  These items can keep you from falling and breaking your bones.

## 2017-08-08 ENCOUNTER — Encounter: Payer: Self-pay | Admitting: Internal Medicine

## 2017-08-17 DIAGNOSIS — R300 Dysuria: Secondary | ICD-10-CM | POA: Diagnosis not present

## 2017-08-17 DIAGNOSIS — N3020 Other chronic cystitis without hematuria: Secondary | ICD-10-CM | POA: Diagnosis not present

## 2017-08-21 DIAGNOSIS — L400 Psoriasis vulgaris: Secondary | ICD-10-CM | POA: Diagnosis not present

## 2017-08-21 DIAGNOSIS — L304 Erythema intertrigo: Secondary | ICD-10-CM | POA: Diagnosis not present

## 2017-08-30 ENCOUNTER — Other Ambulatory Visit: Payer: Self-pay | Admitting: Pulmonary Disease

## 2017-09-01 ENCOUNTER — Encounter: Payer: Self-pay | Admitting: Pulmonary Disease

## 2017-09-01 MED ORDER — PREDNISONE 5 MG PO TABS: 5.0000 mg | ORAL_TABLET | Freq: Every day | ORAL | 5 refills | Status: DC

## 2017-09-02 ENCOUNTER — Other Ambulatory Visit: Payer: Self-pay | Admitting: Pulmonary Disease

## 2017-09-02 NOTE — Telephone Encounter (Signed)
PA for the prednisone has been sent in to [email or web address].  Paper has been placed in the purple folder to follow up on .

## 2017-09-03 ENCOUNTER — Telehealth: Payer: Self-pay

## 2017-09-03 NOTE — Telephone Encounter (Signed)
A prior authorization was completed on 09/02/17 for prednisone. On 09/03/17 more information was needed for the prior authorization. More information was given. PA was approved on 9.6.18. Key for this PA is [id number].  Pharmacy has been notified.

## 2017-09-08 DIAGNOSIS — L400 Psoriasis vulgaris: Secondary | ICD-10-CM | POA: Diagnosis not present

## 2017-10-06 NOTE — Telephone Encounter (Signed)
Patient states her supplemental insurance told her they would cover some of the $250 copay reimbursement, but so far, they have not---I have advised patient, I don't get details like that when I verify insurance, the only information given to me is what the patient will need to pay upfront (copay)---patient is going to call her supplemental insurance co again to discuss

## 2017-10-06 NOTE — Telephone Encounter (Signed)
Pt called stating she was supposed to receive a refund for the $250 she paid for her Prolia because she has a supplemental insurance. Please call patient back in regard.

## 2017-10-07 ENCOUNTER — Telehealth: Payer: Self-pay | Admitting: Internal Medicine

## 2017-10-07 NOTE — Telephone Encounter (Signed)
Patient called to schedule Prolia.

## 2017-10-11 ENCOUNTER — Other Ambulatory Visit: Payer: Self-pay | Admitting: Internal Medicine

## 2017-10-14 ENCOUNTER — Ambulatory Visit (INDEPENDENT_AMBULATORY_CARE_PROVIDER_SITE_OTHER): Payer: Medicare Other | Admitting: Internal Medicine

## 2017-10-14 ENCOUNTER — Encounter: Payer: Self-pay | Admitting: Internal Medicine

## 2017-10-14 ENCOUNTER — Other Ambulatory Visit: Payer: Self-pay | Admitting: Internal Medicine

## 2017-10-14 ENCOUNTER — Other Ambulatory Visit: Payer: Medicare Other

## 2017-10-14 VITALS — BP 122/72 | HR 86 | Temp 98.1°F | Resp 16 | Wt 166.0 lb

## 2017-10-14 DIAGNOSIS — E7849 Other hyperlipidemia: Secondary | ICD-10-CM

## 2017-10-14 DIAGNOSIS — I10 Essential (primary) hypertension: Secondary | ICD-10-CM | POA: Diagnosis not present

## 2017-10-14 DIAGNOSIS — F419 Anxiety disorder, unspecified: Secondary | ICD-10-CM

## 2017-10-14 DIAGNOSIS — F411 Generalized anxiety disorder: Secondary | ICD-10-CM | POA: Diagnosis not present

## 2017-10-14 DIAGNOSIS — F3289 Other specified depressive episodes: Secondary | ICD-10-CM | POA: Diagnosis not present

## 2017-10-14 DIAGNOSIS — K219 Gastro-esophageal reflux disease without esophagitis: Secondary | ICD-10-CM | POA: Diagnosis not present

## 2017-10-14 DIAGNOSIS — Z23 Encounter for immunization: Secondary | ICD-10-CM

## 2017-10-14 DIAGNOSIS — R739 Hyperglycemia, unspecified: Secondary | ICD-10-CM

## 2017-10-14 MED ORDER — DULOXETINE HCL 60 MG PO CPEP: 60.0000 mg | ORAL_CAPSULE | Freq: Every day | ORAL | 3 refills | Status: DC

## 2017-10-14 NOTE — Assessment & Plan Note (Addendum)
Takes 1/2 pill as needed, not daily - has increased use Anxiety not controlled Increase cymbalta to 60 mg daily Use ativan only as needed, but discussed addiction potential and advised to try to keep to a minimum
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
## 2017-10-14 NOTE — Assessment & Plan Note (Signed)
Controlled, but anxiety is not Increased cymbalta to 60 mg daily

## 2017-10-14 NOTE — Patient Instructions (Addendum)
  Test(s) ordered today. Your results will be released to [place] (or called to you) after review, usually within 72hours after test completion. If any changes need to be made, you will be notified at that same time.  All other Health Maintenance issues reviewed.   All recommended immunizations and age-appropriate screenings are up-to-date or discussed.  Flu immunization administered today.   Medications reviewed and updated.  Changes include increasing cymbalta to 60 mg daily.   Your prescription(s) have been submitted to your pharmacy. Please take as directed and contact our office if you believe you are having problem(s) with the medication(s).   Please followup in 6 months

## 2017-10-14 NOTE — Telephone Encounter (Signed)
[place] Controlled Substance Database checked. Last filled on 06/30/2017

## 2017-10-14 NOTE — Assessment & Plan Note (Signed)
Check A1c. 

## 2017-10-14 NOTE — Assessment & Plan Note (Signed)
BP Readings from Last 3 Encounters:  10/14/17 122/72  07/23/17 128/72  06/23/17 118/80    BP well controlled Current regimen effective and well tolerated Continue current medications at current doses cmp

## 2017-10-14 NOTE — Assessment & Plan Note (Addendum)
GERD controlled Continue daily every other day medication

## 2017-10-14 NOTE — Assessment & Plan Note (Signed)
Check lipid panel  Continue daily statin Regular exercise and healthy diet encouraged  

## 2017-10-15 ENCOUNTER — Ambulatory Visit: Payer: Medicare Other

## 2017-10-15 ENCOUNTER — Ambulatory Visit: Payer: Medicare Other | Admitting: Pulmonary Disease

## 2017-10-16 ENCOUNTER — Telehealth: Payer: Self-pay | Admitting: Emergency Medicine

## 2017-10-16 DIAGNOSIS — E785 Hyperlipidemia, unspecified: Secondary | ICD-10-CM

## 2017-10-16 DIAGNOSIS — R739 Hyperglycemia, unspecified: Secondary | ICD-10-CM

## 2017-10-16 DIAGNOSIS — I10 Essential (primary) hypertension: Secondary | ICD-10-CM

## 2017-10-16 NOTE — Telephone Encounter (Signed)
Lab contacted [place] states that pt was supposed to come back to for blood draw and has not returned. The labs have been released but not collected.

## 2017-10-16 NOTE — Telephone Encounter (Signed)
Call and advise she come back for blood work when convenient

## 2017-10-19 ENCOUNTER — Other Ambulatory Visit (INDEPENDENT_AMBULATORY_CARE_PROVIDER_SITE_OTHER): Payer: Medicare Other

## 2017-10-19 ENCOUNTER — Encounter: Payer: Self-pay | Admitting: Internal Medicine

## 2017-10-19 DIAGNOSIS — R739 Hyperglycemia, unspecified: Secondary | ICD-10-CM

## 2017-10-19 DIAGNOSIS — I10 Essential (primary) hypertension: Secondary | ICD-10-CM

## 2017-10-19 DIAGNOSIS — E785 Hyperlipidemia, unspecified: Secondary | ICD-10-CM | POA: Diagnosis not present

## 2017-10-19 LAB — COMPREHENSIVE METABOLIC PANEL WITH GFR
ALT: 19 U/L (ref 0–35)
AST: 27 U/L (ref 0–37)
Albumin: 3.9 g/dL (ref 3.5–5.2)
Alkaline Phosphatase: 38 U/L — ABNORMAL LOW (ref 39–117)
BUN: 12 mg/dL (ref 6–23)
CO2: 28 meq/L (ref 19–32)
Calcium: 9.5 mg/dL (ref 8.4–10.5)
Chloride: 105 meq/L (ref 96–112)
Creatinine, Ser: 0.84 mg/dL (ref 0.40–1.20)
GFR: 70.35 mL/min
Glucose, Bld: 82 mg/dL (ref 70–99)
Potassium: 3.6 meq/L (ref 3.5–5.1)
Sodium: 142 meq/L (ref 135–145)
Total Bilirubin: 0.4 mg/dL (ref 0.2–1.2)
Total Protein: 6.8 g/dL (ref 6.0–8.3)

## 2017-10-19 LAB — CBC WITH DIFFERENTIAL/PLATELET
Basophils Absolute: 0.1 10*3/uL (ref 0.0–0.1)
Basophils Relative: 0.8 % (ref 0.0–3.0)
Eosinophils Absolute: 1.1 10*3/uL — ABNORMAL HIGH (ref 0.0–0.7)
Eosinophils Relative: 14.5 % — ABNORMAL HIGH (ref 0.0–5.0)
HCT: 40.1 % (ref 36.0–46.0)
Hemoglobin: 13.4 g/dL (ref 12.0–15.0)
Lymphocytes Relative: 32.4 % (ref 12.0–46.0)
Lymphs Abs: 2.4 10*3/uL (ref 0.7–4.0)
MCHC: 33.3 g/dL (ref 30.0–36.0)
MCV: 97.9 fl (ref 78.0–100.0)
Monocytes Absolute: 0.8 10*3/uL (ref 0.1–1.0)
Monocytes Relative: 11.3 % (ref 3.0–12.0)
Neutro Abs: 3 10*3/uL (ref 1.4–7.7)
Neutrophils Relative %: 41 % — ABNORMAL LOW (ref 43.0–77.0)
Platelets: 241 10*3/uL (ref 150.0–400.0)
RBC: 4.1 Mil/uL (ref 3.87–5.11)
RDW: 13.3 % (ref 11.5–15.5)
WBC: 7.4 10*3/uL (ref 4.0–10.5)

## 2017-10-19 LAB — LIPID PANEL
Cholesterol: 170 mg/dL (ref 0–200)
HDL: 53.4 mg/dL (ref >39.00)
LDL Cholesterol: 94 mg/dL (ref 0–99)
NonHDL: 116.33
Total CHOL/HDL Ratio: 3
Triglycerides: 111 mg/dL (ref 0.0–149.0)
VLDL: 22.2 mg/dL (ref 0.0–40.0)

## 2017-10-19 LAB — HEMOGLOBIN A1C: Hgb A1c MFr Bld: 5 % (ref 4.6–6.5)

## 2017-10-19 NOTE — Telephone Encounter (Signed)
Lab called and stated pt came in for blood work. Labs reordered.

## 2017-10-26 ENCOUNTER — Telehealth: Payer: Self-pay | Admitting: Adult Health

## 2017-10-26 ENCOUNTER — Other Ambulatory Visit: Payer: Medicare Other

## 2017-10-26 ENCOUNTER — Encounter: Payer: Self-pay | Admitting: Adult Health

## 2017-10-26 ENCOUNTER — Ambulatory Visit (INDEPENDENT_AMBULATORY_CARE_PROVIDER_SITE_OTHER): Payer: Medicare Other | Admitting: Adult Health

## 2017-10-26 ENCOUNTER — Ambulatory Visit (INDEPENDENT_AMBULATORY_CARE_PROVIDER_SITE_OTHER)
Admission: RE | Admit: 2017-10-26 | Discharge: 2017-10-26 | Disposition: A | Discharge status: Home / Self Care | Payer: Medicare Other | Source: Ambulatory Visit | Attending: Adult Health | Admitting: Adult Health

## 2017-10-26 VITALS — BP 112/70 | HR 82 | Ht 60.0 in | Wt 163.2 lb

## 2017-10-26 DIAGNOSIS — J82 Pulmonary eosinophilia, not elsewhere classified: Secondary | ICD-10-CM

## 2017-10-26 DIAGNOSIS — J449 Chronic obstructive pulmonary disease, unspecified: Secondary | ICD-10-CM | POA: Diagnosis not present

## 2017-10-26 DIAGNOSIS — J8289 Other pulmonary eosinophilia, not elsewhere classified: Secondary | ICD-10-CM

## 2017-10-26 DIAGNOSIS — J45998 Other asthma: Secondary | ICD-10-CM

## 2017-10-26 MED ORDER — PREDNISONE 10 MG PO TABS: 10.0000 mg | ORAL_TABLET | Freq: Every day | ORAL | 1 refills | Status: DC

## 2017-10-26 MED ORDER — MOMETASONE FUROATE 220 MCG/INH IN AEPB: 2.0000 | INHALATION_SPRAY | Freq: Every day | RESPIRATORY_TRACT | 3 refills | Status: DC

## 2017-10-26 MED ORDER — MOMETASONE FUROATE 200 MCG/ACT IN AERO: 2.0000 | INHALATION_SPRAY | Freq: Every day | RESPIRATORY_TRACT | 0 refills | Status: DC

## 2017-10-26 NOTE — Telephone Encounter (Signed)
Spoke with the pt  She is concerned that her cxr report mentioned atherosclerosis  She is wondering if this is something that is an immediate concern Please advise thanks!

## 2017-10-26 NOTE — Progress Notes (Signed)
Called spoke with patient, advised of cxr results / recs as stated by TP.  Pt verbalized her understanding and denied any questions.  Cxr routed to PCP to discuss aortic atherosclerosis.

## 2017-10-26 NOTE — Telephone Encounter (Signed)
It does not tell us the degree of plaque buildup just that it is there.  She should see her PCP to discuss if additional cardiac workup is indicated - risk factors , etc

## 2017-10-26 NOTE — Assessment & Plan Note (Signed)
Mild flare in symptoms with increased peripheral eosinophils  Check cbc w/ diff  Check cxr  Take Asmanex on regular basis   Plan  Patient Instructions  Chest xray and labs  today .  Increase Prednisone 10mg  daily.  Restart Asmanex 2 puffs  daily .  Follow up with Dr. Halford Chessman  In 4 weeks and As needed   Please contact office for sooner follow up if symptoms do not improve or worsen or seek emergency care

## 2017-10-26 NOTE — Patient Instructions (Addendum)
Chest xray and labs  today .  Increase Prednisone 10mg  daily.  Restart Asmanex 2 puffs  daily .  Follow up with Dr. Halford Chessman  In 4 weeks and As needed   Please contact office for sooner follow up if symptoms do not improve or worsen or seek emergency care

## 2017-10-26 NOTE — Progress Notes (Signed)
I have reviewed and agree with assessment/plan.  [name], MD [facility] Pulmonary/Critical Care 10/26/2017, 5:14 PM Pager:  [phone]
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
## 2017-10-26 NOTE — Telephone Encounter (Signed)
Spoke with pt and notified of recs per TP. Pt verbalized understanding and denied any questions.

## 2017-10-26 NOTE — Assessment & Plan Note (Signed)
Increased eosinophils noted on labs  Check IgE /RAST  Increase prednisone 10mg  daily   Plan  Patient Instructions  Chest xray and labs  today .  Increase Prednisone 10mg  daily.  Restart Asmanex 2 puffs  daily .  Follow up with Dr. Halford Chessman  In 4 weeks and As needed   Please contact office for sooner follow up if symptoms do not improve or worsen or seek emergency care

## 2017-10-27 LAB — RESPIRATORY ALLERGY PROFILE REGION II ~~LOC~~

## 2017-10-27 LAB — INTERPRETATION:

## 2017-10-29 ENCOUNTER — Telehealth: Payer: Self-pay | Admitting: Adult Health

## 2017-10-29 NOTE — Progress Notes (Signed)
Spoke with pt and notified of results per Polly Cobia, NP Pt verbalized understanding and denied any questions.

## 2017-10-29 NOTE — Telephone Encounter (Signed)
Spoke with pt, had additional questions regarding cxr. Questions were answered to best of my ability.  Pt requesting results to be sent to mychart.  These are available in mychart.  Nothing further needed.

## 2017-10-30 DIAGNOSIS — N3020 Other chronic cystitis without hematuria: Secondary | ICD-10-CM | POA: Diagnosis not present

## 2017-11-03 ENCOUNTER — Encounter: Payer: Self-pay | Admitting: Neurology

## 2017-11-03 ENCOUNTER — Ambulatory Visit (INDEPENDENT_AMBULATORY_CARE_PROVIDER_SITE_OTHER): Payer: Medicare Other | Admitting: Neurology

## 2017-11-03 VITALS — BP 148/82 | HR 83 | Ht 60.0 in | Wt 163.0 lb

## 2017-11-03 DIAGNOSIS — F99 Mental disorder, not otherwise specified: Secondary | ICD-10-CM | POA: Diagnosis not present

## 2017-11-03 DIAGNOSIS — R413 Other amnesia: Secondary | ICD-10-CM

## 2017-11-03 DIAGNOSIS — R410 Disorientation, unspecified: Secondary | ICD-10-CM

## 2017-11-03 DIAGNOSIS — R42 Dizziness and giddiness: Secondary | ICD-10-CM
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
## 2017-11-03 NOTE — Patient Instructions (Addendum)
1. Schedule MRI brain without contrast  We have sent a referral to [facility] for your MRI and they will call you directly to schedule your appt. They are located at [place]. If you need to contact them directly please call [phone].   2. Schedule routine EEG 3. Schedule Neurocognitive testing with Dr. Si Raider 4. The thoracic atherosclerosis is common as we grow older and would not cause any significant issues 5. Look into swimming as exercise 6. Follow-up in 6 months  You have been referred for a neurocognitive evaluation in our office.   The evaluation takes approximately two hours. The first part of the appointment is a clinical interview with the neuropsychologist (Dr. Macarthur Critchley). Please bring someone with you to this appointment if possible, as it is helpful for Dr. Si Raider to hear from both you and another adult who knows you well. After speaking with Dr. Si Raider, you will complete testing with her technician. The testing includes a variety of tasks- mostly question-and-answer, some paper-and-pencil. There is nothing you need to do to prepare for this appointment, but having a good night's sleep prior to the testing, and bringing eyeglasses and hearing aids (if you wear them), is advised.   About a week after the evaluation, you will return to follow up with Dr. Si Raider to review the test results. This appointment is about 30 minutes. If you would like a family member to receive this information as well, please bring them to the appointment.   We have to reserve several hours of the neuropsychologist's time and the psychometrician's time for your evaluation appointment. As such, please note that there is a No-Show fee of $100. If you are unable to attend any of your appointments, please contact our office as soon as possible to reschedule.

## 2017-11-04 ENCOUNTER — Telehealth: Payer: Self-pay | Admitting: Internal Medicine

## 2017-11-04 NOTE — Telephone Encounter (Signed)
Patient is calling saying it is time for her prolia injection. She has been informed her last one was 06/25/17 it has to be 33months. That you would give the patient a call once it is time for her to have this set up. She also stated a mistake was made last time as far what she would have to pay. That her insurance gave her back her copay. So she knows they will cover it a 100%. She asked to have your number to call you incase you forget to call. I informed her you are very good at following up and giving calls back.

## 2017-11-05 ENCOUNTER — Ambulatory Visit (INDEPENDENT_AMBULATORY_CARE_PROVIDER_SITE_OTHER): Payer: Medicare Other | Admitting: Neurology

## 2017-11-05 ENCOUNTER — Encounter: Payer: Self-pay | Admitting: Neurology

## 2017-11-05 DIAGNOSIS — R413 Other amnesia: Secondary | ICD-10-CM

## 2017-11-05 DIAGNOSIS — R42 Dizziness and giddiness: Secondary | ICD-10-CM | POA: Diagnosis not present

## 2017-11-05 DIAGNOSIS — R410 Disorientation, unspecified: Secondary | ICD-10-CM

## 2017-11-05 NOTE — Procedures (Signed)
ELECTROENCEPHALOGRAM REPORT  Date of Study: 11/05/2017  Patient's Name: Lisa Larson MRN: [id number] Date of Birth: 10/01/1941  Referring Provider: Dr. Ellouise Newer  Clinical History: This is a 76 year old with episodes of staring and confusion.  Medications: TYLENOL 325 MG tablet  PROAIR HFA 108 (90 BASE) MCG/ACT inhaler  Betamethasone Valerate 0.12 % foam Apply to scalp twice daily as needed  OS-CAL 600 MG TABS tablet Cetirizine HCl 10 MG CAPS  VITAMIN D3 2000 UNITS capsule  CYMBALTA 60 MG capsule Fluocinolone Acetonide 0.01 % SHAM Apply daily. FLONASE 50 MCG/ACT nasal spray  NEURONTIN 300 MG capsule  ATIVAN 1 MG tablet COZAAR 100 MG tablet ASMANEX 60 METERED DOSES 220 MCG/INH inhaler  ASMANEX HFA 200 MCG/ACT AERO  MYCOSTATIN/NYSTOP powder  PRILOSEC 40 MG capsule  DELTASONE 10 MG tablet PHILLIPS COLON HEALTH PO  VITAMIN B-1 100 MG tablet   Technical Summary: A multichannel digital EEG recording measured by the international 10-20 system with electrodes applied with paste and impedances below 5000 ohms performed in our laboratory with EKG monitoring in an awake and drowsy patient.  Hyperventilation was not performed. Photic stimulation was performed.  The digital EEG was referentially recorded, reformatted, and digitally filtered in a variety of bipolar and referential montages for optimal display.    Description: The patient is awake and drowsy during the recording.  During maximal wakefulness, there is a symmetric, medium voltage 8 Hz posterior dominant rhythm that attenuates with eye opening.  The record is symmetric.  During drowsiness, there is an increase in theta slowing of the background.  Deeper stages of sleep were not seen. Photic stimulation did not elicit any abnormalities.  There were no epileptiform discharges or electrographic seizures seen.    EKG lead was unremarkable.  Impression: This awake and drowsy EEG is normal.    Clinical Correlation: A  normal EEG does not exclude a clinical diagnosis of epilepsy.  If further clinical questions remain, prolonged EEG may be helpful.  Clinical correlation is advised.   Ellouise Newer, M.D.

## 2017-11-05 NOTE — Telephone Encounter (Signed)
Patient is scheduled for 12/31

## 2017-11-05 NOTE — Telephone Encounter (Signed)
Left message advising patient that it is ok to come in either on/after dec 29, dec 30, dec 31st In order to have same benefit coverage as we have discussed--if patient feels comfortable understanding that I was told she would have copay, yet she was refunded copay last time, and that it might be she could be billed for copay if she doesn't pay our office, then it is ok to allow patient to schedule visit with $0 copay---can talk with tamara if any further questions

## 2017-11-06 ENCOUNTER — Telehealth: Payer: Self-pay

## 2017-11-06 NOTE — Telephone Encounter (Signed)
Spoke with pt relaying recent EEG results.  Pt appreciative.

## 2017-11-10 ENCOUNTER — Encounter: Payer: Self-pay | Admitting: Internal Medicine

## 2017-11-26 ENCOUNTER — Ambulatory Visit
Admission: RE | Admit: 2017-11-26 | Discharge: 2017-11-26 | Disposition: A | Discharge status: Home / Self Care | Payer: Medicare Other | Source: Ambulatory Visit | Attending: Neurology | Admitting: Neurology

## 2017-11-26 DIAGNOSIS — R413 Other amnesia: Secondary | ICD-10-CM | POA: Diagnosis not present

## 2017-11-27 ENCOUNTER — Telehealth: Payer: Self-pay

## 2017-11-27 NOTE — Telephone Encounter (Signed)
-----   Message from Alda Berthold, DO sent at 11/26/2017  5:18 PM EST ----- Please inform patient that her MRI shows age-related ages and volume loss and is stable as compared to 2016.  She has known arthritis and history of old fracture which was also seen.  Overall, no new changes.

## 2017-11-27 NOTE — Telephone Encounter (Signed)
LMOM asking for return call to the office to relay message below

## 2017-12-02 ENCOUNTER — Telehealth: Payer: Self-pay | Admitting: Neurology

## 2017-12-02 ENCOUNTER — Ambulatory Visit (INDEPENDENT_AMBULATORY_CARE_PROVIDER_SITE_OTHER): Payer: Medicare Other | Admitting: Pulmonary Disease

## 2017-12-02 ENCOUNTER — Encounter: Payer: Self-pay | Admitting: Pulmonary Disease

## 2017-12-02 ENCOUNTER — Telehealth: Payer: Self-pay | Admitting: Pulmonary Disease

## 2017-12-02 VITALS — BP 118/78 | HR 79 | Ht 60.0 in | Wt 162.8 lb

## 2017-12-02 DIAGNOSIS — J4550 Severe persistent asthma, uncomplicated: Secondary | ICD-10-CM | POA: Diagnosis not present

## 2017-12-02 DIAGNOSIS — J82 Pulmonary eosinophilia, not elsewhere classified: Secondary | ICD-10-CM

## 2017-12-02 DIAGNOSIS — J8281 Chronic eosinophilic pneumonia: Secondary | ICD-10-CM

## 2017-12-02 DIAGNOSIS — J309 Allergic rhinitis, unspecified: Secondary | ICD-10-CM | POA: Diagnosis not present

## 2017-12-02 NOTE — Telephone Encounter (Signed)
Patient returned your call.  Thanks!

## 2017-12-02 NOTE — Telephone Encounter (Signed)
ATC pt, no answer. Left message for pt to call back.  In the meanwhile VS can we send in a rescue inhaler for pt?

## 2017-12-02 NOTE — Progress Notes (Signed)
Current Outpatient Medications on File Prior to Visit  Medication Sig  . acetaminophen (TYLENOL) 500 MG tablet Take 500 mg by mouth 3 (three) times daily as needed.  . Cetirizine HCl 10 MG CAPS Take 1 capsule (10 mg total) by mouth daily.  . Cholecalciferol (VITAMIN D3) 2000 UNITS capsule Take 2,000 Units by mouth daily.  . DULoxetine (CYMBALTA) 60 MG capsule Take 1 capsule (60 mg total) by mouth daily.  . fluticasone (FLONASE) 50 MCG/ACT nasal spray Place 2 sprays into both nostrils daily.  Marland Kitchen gabapentin (NEURONTIN) 300 MG capsule Take 1 capsule (300 mg total) by mouth 3 (three) times daily.  Marland Kitchen LORazepam (ATIVAN) 1 MG tablet TAKE 1/2 TABLET BY MOUTH EVERY 8 TO 12 HOURS AS NEEDED  . losartan (COZAAR) 100 MG tablet Take 1 tablet (100 mg total) by mouth daily.  . mometasone (ASMANEX 60 METERED DOSES) 220 MCG/INH inhaler Inhale 2 puffs into the lungs daily.  Marland Kitchen omeprazole (PRILOSEC) 40 MG capsule Take 1 capsule (40 mg total) by mouth daily.  . predniSONE (DELTASONE) 10 MG tablet Take 1 tablet (10 mg total) by mouth daily with breakfast.  . Probiotic Product ([place]) Take 1 capsule by mouth daily as needed (For overall digestive health.).   Marland Kitchen thiamine (VITAMIN B-1) 100 MG tablet Take 100 mg by mouth daily as needed.    No current facility-administered medications on file prior to visit.      Chief Complaint  Patient presents with  . Follow-up    asthma follow, states she has had no problems breathing, no sob     Pulmonary tests CBC 01/14/11>>39% eosinophils 01/14/11>> HIV negative, ACE 39, RF negative, ANA 1:40, ANCA negative  CT chest 01/16/11>>Multifocal b/l ASD with GGO BAL 01/21/11>>48% Eosinophils  IgE 02/25/11>>282 CT chest 07/22/11>>resolution of ASD CT chest 10/03/11>>recurrence of nodular infiltrate Rt upper lobe PFT 11/04/12>>FEV1 1.77 (87%), FEV1% 68, TLC 4.87 (101%), DLCO 69%, +BD from FEF 25-75%. 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 female former smoker with eosinophilic pneumonia, asthma.  She had to increase her prednisone dose again.  This has happened a few times this year.  She does better with prednisone at 10 mg daily.  If it goes lower, then she gets more cough, wheeze, and congestion.  She also feels more fatigued.  She has been using asmanex.    She hasn't had recent fever, or skin rash.  She denies chest pain, or abdominal pain.  Physical Exam  General - pleasant Eyes - pupils reactive ENT - no sinus tenderness, no oral exudate, no LAN Cardiac - regular, no murmur Chest - no wheeze, rales Abd - soft, non tender Ext - no edema Skin - no rashes Neuro - normal strength Psych - normal mood   CBC    Component Value Date/Time   WBC 7.4 10/19/2017 1106   RBC 4.10 10/19/2017 1106   HGB 13.4 10/19/2017 1106   HCT 40.1 10/19/2017 1106   PLT 241.0 10/19/2017 1106   MCV 97.9 10/19/2017 1106  MCH 33.4 05/23/2016 1015   MCHC 33.3 10/19/2017 1106   RDW 13.3 10/19/2017 1106   LYMPHSABS 2.4 10/19/2017 1106   MONOABS 0.8 10/19/2017 1106   EOSABS 1.1 (H) 10/19/2017 1106   BASOSABS 0.1 10/19/2017 1106    CXR 10/26/17 >> RML scarring  Assessment/Plan  Eosinophilic pneumonia. - clinically stable - continue prednisone 5 mg daily - defer repeat labs, or CXR at this time  Allergic asthma. - she has frequent exacerbations and is requiring prednisone therapy - will continue asmanex - will start process to see if she can get approval for nucala or fasenra  Allergic rhinitis. - continue flonase   Patient Instructions   Will start the process to get approval for Nucala  Follow up in 3 months   [name], MD [facility] Pager:  [phone] 12/02/2017, 1:17 PM

## 2017-12-02 NOTE — Patient Instructions (Signed)
Will start the process to get approval for Nucala  Follow up in 3 months

## 2017-12-03 ENCOUNTER — Other Ambulatory Visit: Payer: Self-pay

## 2017-12-03 MED ORDER — ALBUTEROL SULFATE HFA 108 (90 BASE) MCG/ACT IN AERS: 2.0000 | INHALATION_SPRAY | Freq: Four times a day (QID) | RESPIRATORY_TRACT | 3 refills | Status: DC | PRN

## 2017-12-03 NOTE — Telephone Encounter (Signed)
Rx sent and sent pt a message through mychart.

## 2017-12-03 NOTE — Telephone Encounter (Signed)
Can send script for albuterol two puffs q6h prn cough, wheeze, or shortness of breath.

## 2017-12-16 ENCOUNTER — Ambulatory Visit (INDEPENDENT_AMBULATORY_CARE_PROVIDER_SITE_OTHER): Payer: Medicare Other | Admitting: Family

## 2017-12-16 ENCOUNTER — Encounter: Payer: Self-pay | Admitting: Family

## 2017-12-16 VITALS — BP 132/76 | HR 82 | Temp 98.1°F | Ht 60.0 in | Wt 164.0 lb

## 2017-12-16 DIAGNOSIS — J45909 Unspecified asthma, uncomplicated: Secondary | ICD-10-CM

## 2017-12-16 MED ORDER — BENZONATATE 200 MG PO CAPS: 200.0000 mg | ORAL_CAPSULE | Freq: Two times a day (BID) | ORAL | 0 refills | Status: DC | PRN

## 2017-12-16 MED ORDER — FLUTICASONE PROPIONATE 50 MCG/ACT NA SUSP: 2.0000 | Freq: Every day | NASAL | 5 refills | Status: DC

## 2017-12-16 MED ORDER — DOXYCYCLINE HYCLATE 100 MG PO TABS: 100.0000 mg | ORAL_TABLET | Freq: Two times a day (BID) | ORAL | 0 refills | Status: DC
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
## 2017-12-16 NOTE — Patient Instructions (Signed)
Please use Asmanex and Flonase everyday;

## 2017-12-17 ENCOUNTER — Encounter: Payer: Self-pay | Admitting: Family

## 2017-12-17 DIAGNOSIS — S12112A Nondisplaced Type II dens fracture, initial encounter for closed fracture: Secondary | ICD-10-CM | POA: Diagnosis not present

## 2017-12-24 ENCOUNTER — Other Ambulatory Visit: Payer: Self-pay | Admitting: Adult Health

## 2017-12-24 ENCOUNTER — Encounter: Payer: Self-pay | Admitting: Internal Medicine

## 2017-12-24 NOTE — Telephone Encounter (Signed)
Received Prednisone refill 10 mg daily. Per last office note with VS note stated does better with 10 mg Prednisone daily, but was told to continue with 5 mg daily.    VS please advise on dose

## 2017-12-24 NOTE — Telephone Encounter (Signed)
Electronic refill request received for patient's prednisone 10mg  QD  Patient's prednisone was increased back to the 10mg  daily at the 10.29.18 office visit with TP  Pt most recently seen 52.5.18 by VS: History of Present Illness Lisa Larson is a 76 y.o. female former smoker with eosinophilic pneumonia, asthma.   She had to increase her prednisone dose again.  This has happened a few times this year.  She does better with prednisone at 10 mg daily.  If it goes lower, then she gets more cough, wheeze, and congestion.  She also feels more fatigued.  She has been using asmanex.     She hasn't had recent fever, or skin rash.  She denies chest pain, or abdominal pain.  Assessment/Plan Eosinophilic pneumonia. - clinically stable - continue prednisone 5 mg daily - defer repeat labs, or CXR at this time   Allergic asthma. - she has frequent exacerbations and is requiring prednisone therapy - will continue asmanex - will start process to see if she can get approval for nucala or fasenra   Allergic rhinitis. - continue flonase   Patient Instructions  Will start the process to get approval for Nucala   Follow up in 3 months  ++++++++++++++++++++++++++++++++++++++++++++++++++++++++++++++++ Dr Halford Chessman, the office note reports patient does well on the 10mg  but the A&P state to continue 5mg  daily Please advise if okay to refill the 10mg  daily prednisone for patient  Thank you!

## 2017-12-24 NOTE — Telephone Encounter (Signed)
She should be on 5 mg prednisone daily.

## 2017-12-25 NOTE — Telephone Encounter (Signed)
As detailed in my previous message, she should be on 5 mg prednisone daily.

## 2017-12-26 ENCOUNTER — Ambulatory Visit (INDEPENDENT_AMBULATORY_CARE_PROVIDER_SITE_OTHER): Payer: Medicare Other | Admitting: Family Medicine

## 2017-12-26 ENCOUNTER — Encounter: Payer: Self-pay | Admitting: Family Medicine

## 2017-12-26 VITALS — BP 118/76 | HR 86 | Temp 98.0°F | Wt 163.0 lb

## 2017-12-26 DIAGNOSIS — S20219A Contusion of unspecified front wall of thorax, initial encounter: Secondary | ICD-10-CM | POA: Insufficient documentation

## 2017-12-26 DIAGNOSIS — Y92009 Unspecified place in unspecified non-institutional (private) residence as the place of occurrence of the external cause: Secondary | ICD-10-CM | POA: Diagnosis not present

## 2017-12-26 DIAGNOSIS — W19XXXA Unspecified fall, initial encounter: Secondary | ICD-10-CM | POA: Diagnosis not present
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
## 2017-12-26 NOTE — Patient Instructions (Signed)
I think you have a chest contusion from your fall/injury   Take tylenol for pain   Use heat on your chest (gentle) - for 10 minutes at a time whenever you can  For the knee- use ice/cold compress 10 minutes at a time   Every 30-60 minutes- grab a pillow- take a deep breath and cough (to keep you from getting pneumonia)  If you develop worse cough or fever  - please alert [place]   Update if not starting to improve in a week or if worsening  - make appt to see your primary care provider  If you develop severe shortness of breath -please go to the emergency room

## 2017-12-27 DIAGNOSIS — W19XXXA Unspecified fall, initial encounter: Secondary | ICD-10-CM | POA: Insufficient documentation

## 2017-12-27 DIAGNOSIS — Y92009 Unspecified place in unspecified non-institutional (private) residence as the place of occurrence of the external cause: Secondary | ICD-10-CM
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
## 2017-12-28 ENCOUNTER — Ambulatory Visit (INDEPENDENT_AMBULATORY_CARE_PROVIDER_SITE_OTHER): Payer: Medicare Other | Admitting: *Deleted

## 2017-12-28 DIAGNOSIS — M8580 Other specified disorders of bone density and structure, unspecified site: Secondary | ICD-10-CM

## 2017-12-28 MED ORDER — DENOSUMAB 60 MG/ML ~~LOC~~ SOLN
60.0000 mg | Freq: Once | SUBCUTANEOUS | Status: AC
  Administered 2017-12-28: 60 mg via SUBCUTANEOUS

## 2018-01-05 ENCOUNTER — Ambulatory Visit: Payer: Medicare Other | Admitting: Internal Medicine

## 2018-01-25 ENCOUNTER — Telehealth: Payer: Self-pay | Admitting: Pulmonary Disease

## 2018-01-25 ENCOUNTER — Other Ambulatory Visit: Payer: Self-pay | Admitting: Pulmonary Disease

## 2018-01-25 MED ORDER — PREDNISONE 5 MG PO TABS: 5.0000 mg | ORAL_TABLET | Freq: Every day | ORAL | 0 refills | Status: DC

## 2018-01-25 NOTE — Telephone Encounter (Signed)
Called and spoke with pt verifying the pharmacy. Sent refill to pt's preferred pharmacy. Nothing further needed at this current time.

## 2018-02-04 ENCOUNTER — Ambulatory Visit: Payer: Medicare Other | Admitting: Psychology

## 2018-02-04 ENCOUNTER — Encounter: Payer: Self-pay | Admitting: Psychology

## 2018-02-04 ENCOUNTER — Ambulatory Visit (INDEPENDENT_AMBULATORY_CARE_PROVIDER_SITE_OTHER): Payer: Medicare Other | Admitting: Psychology

## 2018-02-04 DIAGNOSIS — S060X0S Concussion without loss of consciousness, sequela: Secondary | ICD-10-CM

## 2018-02-04 DIAGNOSIS — R413 Other amnesia: Secondary | ICD-10-CM | POA: Diagnosis not present

## 2018-02-04 DIAGNOSIS — R4189 Other symptoms and signs involving cognitive functions and awareness: Secondary | ICD-10-CM

## 2018-02-04 DIAGNOSIS — F1010 Alcohol abuse, uncomplicated: Secondary | ICD-10-CM

## 2018-02-04 DIAGNOSIS — F419 Anxiety disorder, unspecified: Secondary | ICD-10-CM

## 2018-02-04 DIAGNOSIS — R4689 Other symptoms and signs involving appearance and behavior: Secondary | ICD-10-CM
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
## 2018-02-04 NOTE — Progress Notes (Signed)
   Neuropsychology Note  Lisa Larson completed 60 minutes of neuropsychological testing with technician, Milana Kidney, BS, under the supervision of Dr. Macarthur Critchley, Licensed Psychologist. The patient did not appear overtly distressed by the testing session, per behavioral observation or via self-report to the technician. Rest breaks were offered.   Clinical Decision Making: In considering the patient's current level of functioning, level of presumed impairment, nature of symptoms, emotional and behavioral responses during the interview, level of literacy, and observed level of motivation/effort, a battery of tests was selected and communicated to the psychometrician.  Communication between the psychologist and technician was ongoing throughout the testing session and changes were made as deemed necessary based on patient performance on testing, technician observations and additional pertinent factors such as those listed above.  Lisa Larson will return within approximately 2 weeks for an interactive feedback session with Dr. Si Raider at which time her test performances, clinical impressions and treatment recommendations will be reviewed in detail. The patient understands she can contact our office should she require our assistance before this time.  15 minutes spent performing neuropsychological evaluation services/clinical decision making (psychologist). [CPT 42706] 60 minutes spent face-to-face with patient administering standardized tests, 30 minutes spent scoring (technician). [CPT [id number], 23762]  Full report to follow.
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
## 2018-02-15 ENCOUNTER — Ambulatory Visit (INDEPENDENT_AMBULATORY_CARE_PROVIDER_SITE_OTHER): Payer: Medicare Other | Admitting: Psychology

## 2018-02-15 ENCOUNTER — Encounter: Payer: Self-pay | Admitting: Psychology

## 2018-02-15 ENCOUNTER — Telehealth (INDEPENDENT_AMBULATORY_CARE_PROVIDER_SITE_OTHER): Payer: Self-pay | Admitting: Orthopedic Surgery

## 2018-02-15 DIAGNOSIS — S060X0S Concussion without loss of consciousness, sequela: Secondary | ICD-10-CM

## 2018-02-15 DIAGNOSIS — R413 Other amnesia: Secondary | ICD-10-CM

## 2018-02-15 DIAGNOSIS — F1011 Alcohol abuse, in remission: Secondary | ICD-10-CM

## 2018-02-15 DIAGNOSIS — F411 Generalized anxiety disorder: Secondary | ICD-10-CM

## 2018-02-15 DIAGNOSIS — G3184 Mild cognitive impairment, so stated: Secondary | ICD-10-CM

## 2018-02-15 NOTE — Telephone Encounter (Signed)
I called this number and it is a law firm. I am not sure what forms they are talking about but figured this would go to you.
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
## 2018-02-15 NOTE — Telephone Encounter (Signed)
Olean Ree called asking about some forms that were faxed over from 2017/2018 that they were waiting for a response and wanted to speak to a nurse. CB # [phone]

## 2018-02-18 ENCOUNTER — Telehealth: Payer: Self-pay | Admitting: Internal Medicine

## 2018-02-18 MED ORDER — SERTRALINE HCL 25 MG PO TABS: 25.0000 mg | ORAL_TABLET | Freq: Every day | ORAL | 1 refills | Status: DC

## 2018-02-18 NOTE — Telephone Encounter (Signed)
Spoke with pt. Appt scheduled for follow-up

## 2018-02-18 NOTE — Telephone Encounter (Signed)
Have her f/u with me in 4-6 weeks

## 2018-02-18 NOTE — Telephone Encounter (Signed)
She recently went to neurology for a memory evaluation.  Dr. Si Raider advised me that she was interested in further treatment for anxiety.  We could consider starting sertraline at night at 25 mg nightly, which would likely help.  She can also consider seeing a therapist.

## 2018-02-18 NOTE — Telephone Encounter (Signed)
Spoke with pt. RX sent to [facility]. Pt advised to call back if she is having any kind of side effects from medication. Does she need to follow-up before July appt?

## 2018-02-24 NOTE — Telephone Encounter (Signed)
IC, LMVM advising Dr Marlou Sa declined to complete the questionnaire. He stated to refer to the office notes. I told her that if she needed another copy of the records, to submit a new request.

## 2018-02-25 ENCOUNTER — Encounter: Payer: Self-pay | Admitting: Pulmonary Disease

## 2018-02-25 NOTE — Telephone Encounter (Signed)
Tammy, please advise if you have received information on pt stating that her insurance will be able to cover her Nucala injections.  Pt is wanting to know when she will be able to get started on them.  Thanks!

## 2018-03-04 ENCOUNTER — Encounter (INDEPENDENT_AMBULATORY_CARE_PROVIDER_SITE_OTHER): Payer: Self-pay | Admitting: Orthopedic Surgery

## 2018-03-04 ENCOUNTER — Ambulatory Visit (INDEPENDENT_AMBULATORY_CARE_PROVIDER_SITE_OTHER): Payer: Medicare Other | Admitting: Orthopedic Surgery

## 2018-03-04 DIAGNOSIS — M1711 Unilateral primary osteoarthritis, right knee: Secondary | ICD-10-CM

## 2018-03-04 DIAGNOSIS — M1712 Unilateral primary osteoarthritis, left knee: Secondary | ICD-10-CM

## 2018-03-05 ENCOUNTER — Encounter (INDEPENDENT_AMBULATORY_CARE_PROVIDER_SITE_OTHER): Payer: Self-pay | Admitting: Orthopedic Surgery

## 2018-03-05 DIAGNOSIS — M1711 Unilateral primary osteoarthritis, right knee: Secondary | ICD-10-CM

## 2018-03-05 DIAGNOSIS — M1712 Unilateral primary osteoarthritis, left knee: Secondary | ICD-10-CM

## 2018-03-05 MED ORDER — LIDOCAINE HCL 1 % IJ SOLN
5.0000 mL | INTRAMUSCULAR | Status: AC | PRN
  Administered 2018-03-05: 5 mL

## 2018-03-05 MED ORDER — TRIAMCINOLONE ACETONIDE 40 MG/ML IJ SUSP
40.0000 mg | INTRAMUSCULAR | Status: AC | PRN
  Administered 2018-03-05: 40 mg via INTRA_ARTICULAR

## 2018-03-05 MED ORDER — BUPIVACAINE HCL 0.25 % IJ SOLN
4.0000 mL | INTRAMUSCULAR | Status: AC | PRN
  Administered 2018-03-05: 4 mL via INTRA_ARTICULAR

## 2018-03-05 MED ORDER — BUPIVACAINE HCL 0.25 % IJ SOLN
4.0000 mL | INTRAMUSCULAR | Status: AC | PRN
Start: 2018-03-05 — End: 2018-03-05
  Administered 2018-03-05: 4 mL via INTRA_ARTICULAR

## 2018-03-05 MED ORDER — TRIAMCINOLONE ACETONIDE 40 MG/ML IJ SUSP
40.0000 mg | INTRAMUSCULAR | Status: AC | PRN
Start: 2018-03-05 — End: 2018-03-05
  Administered 2018-03-05: 40 mg via INTRA_ARTICULAR
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
## 2018-03-09 ENCOUNTER — Ambulatory Visit (INDEPENDENT_AMBULATORY_CARE_PROVIDER_SITE_OTHER)
Admission: RE | Admit: 2018-03-09 | Discharge: 2018-03-09 | Disposition: A | Discharge status: Home / Self Care | Payer: Medicare Other | Source: Ambulatory Visit | Attending: Pulmonary Disease | Admitting: Pulmonary Disease

## 2018-03-09 ENCOUNTER — Encounter: Payer: Self-pay | Admitting: Pulmonary Disease

## 2018-03-09 ENCOUNTER — Telehealth: Payer: Self-pay | Admitting: Pulmonary Disease

## 2018-03-09 ENCOUNTER — Ambulatory Visit (INDEPENDENT_AMBULATORY_CARE_PROVIDER_SITE_OTHER): Payer: Medicare Other | Admitting: Pulmonary Disease

## 2018-03-09 VITALS — BP 122/80 | HR 78 | Ht 60.0 in | Wt 164.0 lb

## 2018-03-09 DIAGNOSIS — R0789 Other chest pain: Secondary | ICD-10-CM

## 2018-03-09 DIAGNOSIS — J4550 Severe persistent asthma, uncomplicated: Secondary | ICD-10-CM | POA: Diagnosis not present

## 2018-03-09 DIAGNOSIS — R079 Chest pain, unspecified: Secondary | ICD-10-CM | POA: Diagnosis not present

## 2018-03-09 DIAGNOSIS — J82 Pulmonary eosinophilia, not elsewhere classified: Secondary | ICD-10-CM

## 2018-03-09 DIAGNOSIS — J309 Allergic rhinitis, unspecified: Secondary | ICD-10-CM | POA: Diagnosis not present

## 2018-03-09 DIAGNOSIS — J8281 Chronic eosinophilic pneumonia: Secondary | ICD-10-CM
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
## 2018-03-09 NOTE — Patient Instructions (Signed)
Chest xray today  Will check on status of starting nucala  Follow up in 4 months

## 2018-03-09 NOTE — Telephone Encounter (Signed)
Spoke with pt. She is anxious about her CXR results. Advised her that the results aren't back yet and would call her as soon as Dr. Halford Chessman reviews them. She agreed and verbalized understanding. Nothing further was needed.

## 2018-03-10 ENCOUNTER — Telehealth: Payer: Self-pay | Admitting: Pulmonary Disease

## 2018-03-10 ENCOUNTER — Encounter: Payer: Self-pay | Admitting: Pulmonary Disease

## 2018-03-10 DIAGNOSIS — L820 Inflamed seborrheic keratosis: Secondary | ICD-10-CM | POA: Diagnosis not present

## 2018-03-10 DIAGNOSIS — B078 Other viral warts: Secondary | ICD-10-CM | POA: Diagnosis not present

## 2018-03-10 DIAGNOSIS — L304 Erythema intertrigo: Secondary | ICD-10-CM | POA: Diagnosis not present

## 2018-03-10 NOTE — Telephone Encounter (Signed)
Dg Chest 2 View  Result Date: 03/09/2018 CLINICAL DATA:  Chest pain and pressure for the past 3 months. EXAM: CHEST - 2 VIEW COMPARISON:  Chest x-ray dated October 26, 2017. FINDINGS: The heart size and mediastinal contours are within normal limits. Normal pulmonary vascularity. Unchanged scarring in the right middle lobe. No focal consolidation, pleural effusion, or pneumothorax. No acute osseous abnormality. IMPRESSION: No active cardiopulmonary disease. Electronically Signed   By: Titus Dubin M.D.   On: 03/09/2018 15:09     Chest xray showed scar in right lung that has been present from before, and not causing any problems.  Nothing else seen that would explain her chest discomfort.  She can try taking OTC pain medications.

## 2018-03-10 NOTE — Telephone Encounter (Signed)
Advised pt of results. Pt understood and nothing further is needed.   

## 2018-03-10 NOTE — Telephone Encounter (Signed)
Spoke with patient. She is requesting the results of her xray that was done yesterday. She is very anxious about the results because she is still having SOB and chest pressure.   VS, please advise on patient's results. Thanks!

## 2018-03-12 ENCOUNTER — Telehealth: Payer: Self-pay | Admitting: Pulmonary Disease

## 2018-03-15 ENCOUNTER — Ambulatory Visit (INDEPENDENT_AMBULATORY_CARE_PROVIDER_SITE_OTHER): Payer: Medicare Other

## 2018-03-15 ENCOUNTER — Telehealth: Payer: Self-pay | Admitting: Pulmonary Disease

## 2018-03-15 DIAGNOSIS — J45998 Other asthma: Secondary | ICD-10-CM | POA: Diagnosis not present

## 2018-03-15 MED ORDER — EPINEPHRINE 0.3 MG/0.3ML IJ SOAJ: 0.3000 mg | Freq: Once | INTRAMUSCULAR | 11 refills | Status: DC

## 2018-03-15 NOTE — Telephone Encounter (Signed)
Arrival Date: 03/15/2018 # of Vials: 1 Lot #: [id number] Expiration Date: 02/2021

## 2018-03-15 NOTE — Telephone Encounter (Signed)
1 Vial Order Date: 03/12/18 Shipping Date: 03/15/2018

## 2018-03-15 NOTE — Telephone Encounter (Signed)
Rx sent 

## 2018-03-15 NOTE — Telephone Encounter (Signed)
Called and cancelled rx, pt. Can't afford $240 (high deduct.)

## 2018-03-16 MED ORDER — MEPOLIZUMAB 100 MG ~~LOC~~ SOLR
100.0000 mg | SUBCUTANEOUS | Status: DC
  Administered 2018-03-15: 100 mg via SUBCUTANEOUS

## 2018-03-16 NOTE — Progress Notes (Signed)
Documentation of medication administration and charges of Nucala have been completed by Pharaoh Pio, CMA based on the Nucala documentation sheet completed by Tammy Scott.   

## 2018-03-29 ENCOUNTER — Other Ambulatory Visit: Payer: Self-pay | Admitting: Pulmonary Disease

## 2018-03-30 ENCOUNTER — Telehealth: Payer: Self-pay | Admitting: Pulmonary Disease

## 2018-03-30 NOTE — Telephone Encounter (Signed)
Called pt and advised message from the provider. Pt understood and verbalized understanding. Nothing further is needed.    
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
## 2018-03-30 NOTE — Telephone Encounter (Signed)
If she is not having any respiratory symptoms (cough, wheeze, shortness of breath, fever, sputum production) then she can remain off of prednisone.

## 2018-03-30 NOTE — Telephone Encounter (Signed)
Called and spoke to patient. Patient stated she has a medication question. She has been on Prednisone 5 mg PO daily as a maintence medication but has run out, did not have the medication today. She has started the [facility] and thought she remembered Dr. Halford Chessman saying she might tapper off the prednisone once taking the Nucala. Patient's 2nd dose of Nucala is scheduled for 04/15/2018.  VS please advise.

## 2018-03-31 ENCOUNTER — Encounter: Payer: Self-pay | Admitting: Internal Medicine

## 2018-03-31 ENCOUNTER — Ambulatory Visit (INDEPENDENT_AMBULATORY_CARE_PROVIDER_SITE_OTHER): Payer: Medicare Other | Admitting: Internal Medicine

## 2018-03-31 VITALS — BP 118/82 | HR 92 | Temp 97.9°F | Resp 16 | Wt 167.0 lb

## 2018-03-31 DIAGNOSIS — F411 Generalized anxiety disorder: Secondary | ICD-10-CM | POA: Diagnosis not present

## 2018-03-31 DIAGNOSIS — M2550 Pain in unspecified joint: Secondary | ICD-10-CM

## 2018-03-31 DIAGNOSIS — H6121 Impacted cerumen, right ear: Secondary | ICD-10-CM | POA: Diagnosis not present

## 2018-03-31 MED ORDER — SERTRALINE HCL 50 MG PO TABS: ORAL_TABLET | ORAL | 3 refills | Status: DC

## 2018-03-31 MED ORDER — MELOXICAM 7.5 MG PO TABS: 7.5000 mg | ORAL_TABLET | Freq: Every day | ORAL | 2 refills | Status: DC

## 2018-03-31 NOTE — Assessment & Plan Note (Signed)
Denies depression, but anxiety not controlled Still taking duloxetine-discontinue Increase sertraline to 50 mg daily for 2 weeks and then 100 mg daily Follow-up in approximately 4 weeks

## 2018-03-31 NOTE — Assessment & Plan Note (Signed)
She has significant arthritis of multiple joints She has GERD, but states it is well controlled.  She was advised by pulmonary in the past not to take any NSAIDs because it may flareup her GERD and affect her asthma I think she would benefit from an NSAID such as meloxicam-we will try 7.5 mg daily.  The only other choice for her is narcotic pain medication, which ideally I would like to avoid Following with orthopedics Will refer to pain management if we are unable to control her pain Try topical arthritis medications Ice/heat Tylenol 1000 mg 3 times daily Follow-up in 1 month

## 2018-03-31 NOTE — Assessment & Plan Note (Signed)
Asymptomatic Right ear canal successfully cleaned out with curette after verbal consent She tolerated the procedure well Lavage not necessary

## 2018-03-31 NOTE — Patient Instructions (Addendum)
Stop taking duloxetine  Increase sertraline to 50 mg a day for two weeks and then increase to 100 mg daily and continue 100 mg a day.   Try putting Aspercreme on your joints for arthritis.   Take tylenol for the arthritis pain, you can take 1000 mg three times a day.   Try meloxicam 7.5 mg daily - take with food.     A referral was ordered for pain management.     Follow up in 4 weeks.

## 2018-04-01 ENCOUNTER — Telehealth: Payer: Self-pay | Admitting: Emergency Medicine

## 2018-04-01 ENCOUNTER — Telehealth: Payer: Self-pay | Admitting: Pulmonary Disease

## 2018-04-01 MED ORDER — PREDNISONE 5 MG PO TABS: 5.0000 mg | ORAL_TABLET | Freq: Every day | ORAL | 3 refills | Status: DC

## 2018-04-01 NOTE — Telephone Encounter (Signed)
Called and spoke to pt. Pt states she is having some wheezing since coming off the pred on 4.2.2019. Pt is requesting to go back on pred 5 mg daily. Per 4.2.2019 phone note the pt requested to come off pred and VS agreed as long as pt is not symptomatic. Advised pt to go back on pred 5mg  since she is having some wheezing since 4.3.2019, pt denies any other signs or symptoms. Pt verbalized understanding and denied any further questions or concerns at this time.   Will send to VS as FYI.

## 2018-04-01 NOTE — Telephone Encounter (Signed)
Agree with plan to resume prednisone 5 mg daily.

## 2018-04-01 NOTE — Telephone Encounter (Signed)
Received call from pt. States she is wheezing again and would like to know if she needs to go back on the prednisone , 5 mg. Can you prescribe it or does this need to go to Pulm?   She also states that she threw up after taking Meloxicam and wants to know if this should be continued or D/C?

## 2018-04-01 NOTE — Telephone Encounter (Signed)
She should contact pulm regarding the prednisone.   Have her try the meloxicam once more - if it causes vomiting again - have her stop it.  Take with food

## 2018-04-01 NOTE — Telephone Encounter (Signed)
Spoke with pt to inform.  

## 2018-04-07 ENCOUNTER — Other Ambulatory Visit: Payer: Self-pay | Admitting: Internal Medicine

## 2018-04-08 ENCOUNTER — Telehealth: Payer: Self-pay | Admitting: Pulmonary Disease

## 2018-04-08 NOTE — Telephone Encounter (Signed)
Vials: 1 Order Date: 04/08/2018 Ship Date: 04/12/2018

## 2018-04-09 NOTE — Telephone Encounter (Signed)
Arrival Date: 04/09/18  # of Vials: 1 Lot #: [id number] Expiration Date: 04/28/2021

## 2018-04-13 ENCOUNTER — Telehealth (INDEPENDENT_AMBULATORY_CARE_PROVIDER_SITE_OTHER): Payer: Self-pay | Admitting: Specialist

## 2018-04-13 NOTE — Telephone Encounter (Signed)
Lisa Larson with [facility],

## 2018-04-13 NOTE — Telephone Encounter (Signed)
Lisa Larson with [employer], [employer]ty [employer]ud[employer], and [employer] Mares is checking on the medical opinion letter that attorney [facility] sent over on 4/8. Please give her a call back # [phone]

## 2018-04-14 NOTE — Telephone Encounter (Signed)
Olean Ree with [employer], [employer], and [employer] is checking on the medical opinion letter that attorney [facility] sent over on 4/8.--I have not seen anything about this--have you seen this and has it been done?

## 2018-04-15 ENCOUNTER — Ambulatory Visit (INDEPENDENT_AMBULATORY_CARE_PROVIDER_SITE_OTHER): Payer: Medicare Other

## 2018-04-15 DIAGNOSIS — J4540 Moderate persistent asthma, uncomplicated: Secondary | ICD-10-CM

## 2018-04-20 ENCOUNTER — Telehealth (INDEPENDENT_AMBULATORY_CARE_PROVIDER_SITE_OTHER): Payer: Self-pay | Admitting: Specialist

## 2018-04-20 NOTE — Telephone Encounter (Signed)
Lisa Larson with [employer], [employer] called back concerning the opinion letter.

## 2018-04-20 NOTE — Telephone Encounter (Signed)
Olean Ree with [employer], [employer]ty Knudsen called back concerning the opinion letter. The number to contact Olean Ree is [phone]

## 2018-04-27 ENCOUNTER — Telehealth (INDEPENDENT_AMBULATORY_CARE_PROVIDER_SITE_OTHER): Payer: Self-pay | Admitting: Specialist

## 2018-04-27 NOTE — Telephone Encounter (Signed)
Olean Ree called from [employer] asking about a doctors opinion letter. If you could give her a call back at [phone]

## 2018-04-27 NOTE — Telephone Encounter (Signed)
I called and advised Olean Ree that we do have 3 messages regarding this and 2 of them have been sent to Dr. Louanne Skye to get this done, however they have not been addressed yet.  But as soon as he addresses the message I will let her know.
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
## 2018-04-28 ENCOUNTER — Other Ambulatory Visit: Payer: Self-pay

## 2018-04-28 ENCOUNTER — Encounter: Payer: Self-pay | Admitting: Pulmonary Disease

## 2018-04-28 ENCOUNTER — Ambulatory Visit (INDEPENDENT_AMBULATORY_CARE_PROVIDER_SITE_OTHER): Payer: Medicare Other | Admitting: Internal Medicine

## 2018-04-28 ENCOUNTER — Other Ambulatory Visit (INDEPENDENT_AMBULATORY_CARE_PROVIDER_SITE_OTHER): Payer: Medicare Other

## 2018-04-28 ENCOUNTER — Encounter: Payer: Self-pay | Admitting: Internal Medicine

## 2018-04-28 VITALS — BP 132/74 | HR 77 | Temp 98.2°F | Resp 16 | Wt 167.0 lb

## 2018-04-28 DIAGNOSIS — F411 Generalized anxiety disorder: Secondary | ICD-10-CM | POA: Diagnosis not present

## 2018-04-28 DIAGNOSIS — I10 Essential (primary) hypertension: Secondary | ICD-10-CM | POA: Diagnosis not present

## 2018-04-28 DIAGNOSIS — M1990 Unspecified osteoarthritis, unspecified site: Secondary | ICD-10-CM

## 2018-04-28 DIAGNOSIS — F3289 Other specified depressive episodes: Secondary | ICD-10-CM | POA: Diagnosis not present

## 2018-04-28 LAB — COMPREHENSIVE METABOLIC PANEL WITH GFR
ALT: 21 U/L (ref 0–35)
AST: 26 U/L (ref 0–37)
Albumin: 4.1 g/dL (ref 3.5–5.2)
Alkaline Phosphatase: 37 U/L — ABNORMAL LOW (ref 39–117)
BUN: 16 mg/dL (ref 6–23)
CO2: 30 meq/L (ref 19–32)
Calcium: 9.5 mg/dL (ref 8.4–10.5)
Chloride: 105 meq/L (ref 96–112)
Creatinine, Ser: 0.96 mg/dL (ref 0.40–1.20)
GFR: 60.22 mL/min
Glucose, Bld: 101 mg/dL — ABNORMAL HIGH (ref 70–99)
Potassium: 4.4 meq/L (ref 3.5–5.1)
Sodium: 141 meq/L (ref 135–145)
Total Bilirubin: 0.4 mg/dL (ref 0.2–1.2)
Total Protein: 6.9 g/dL (ref 6.0–8.3)

## 2018-04-28 LAB — CBC WITH DIFFERENTIAL/PLATELET
Basophils Absolute: 0.1 10*3/uL (ref 0.0–0.1)
Basophils Relative: 0.8 % (ref 0.0–3.0)
Eosinophils Absolute: 0.2 10*3/uL (ref 0.0–0.7)
Eosinophils Relative: 2.4 % (ref 0.0–5.0)
HCT: 39 % (ref 36.0–46.0)
Hemoglobin: 13 g/dL (ref 12.0–15.0)
Lymphocytes Relative: 16.4 % (ref 12.0–46.0)
Lymphs Abs: 1.3 10*3/uL (ref 0.7–4.0)
MCHC: 33.4 g/dL (ref 30.0–36.0)
MCV: 94.9 fl (ref 78.0–100.0)
Monocytes Absolute: 0.8 10*3/uL (ref 0.1–1.0)
Monocytes Relative: 9.8 % (ref 3.0–12.0)
Neutro Abs: 5.6 10*3/uL (ref 1.4–7.7)
Neutrophils Relative %: 70.6 % (ref 43.0–77.0)
Platelets: 232 10*3/uL (ref 150.0–400.0)
RBC: 4.11 Mil/uL (ref 3.87–5.11)
RDW: 12.9 % (ref 11.5–15.5)
WBC: 7.9 10*3/uL (ref 4.0–10.5)

## 2018-04-28 MED ORDER — PREDNISONE 5 MG PO TABS: 5.0000 mg | ORAL_TABLET | Freq: Every day | ORAL | 1 refills | Status: DC

## 2018-04-28 NOTE — Assessment & Plan Note (Signed)
Controlled, stable Continue current dose of medication - sertraline  

## 2018-04-28 NOTE — Assessment & Plan Note (Signed)
BP well controlled Current regimen effective and well tolerated Continue current medications at current doses  

## 2018-04-28 NOTE — Assessment & Plan Note (Signed)
Better controlled Continue sertraline at current dose

## 2018-04-28 NOTE — Assessment & Plan Note (Signed)
Taking Tylenol 3 times a day and meloxicam 4.7 0.5 mg daily with food Overall chronic arthritis pain controlled CMP to check kidney function Continue above

## 2018-04-28 NOTE — Patient Instructions (Signed)
  Test(s) ordered today. Your results will be released to MyChart (or called to you) after review, usually within 72hours after test completion. If any changes need to be made, you will be notified at that same time.  Medications reviewed and updated.  No changes recommended at this time.    Please followup in 6 months   

## 2018-04-29 ENCOUNTER — Encounter: Payer: Self-pay | Admitting: Internal Medicine

## 2018-05-04 MED ORDER — MEPOLIZUMAB 100 MG ~~LOC~~ SOLR
100.0000 mg | SUBCUTANEOUS | Status: DC
  Administered 2018-04-15 – 2019-04-14 (×3): 100 mg via SUBCUTANEOUS

## 2018-05-04 NOTE — Progress Notes (Signed)
Documentation of medication administration and charges of Nucala have been completed by Zay Yeargan, CMA based on the Nucala documentation sheet completed by Tammy Scott.   

## 2018-05-12 ENCOUNTER — Ambulatory Visit: Payer: Medicare Other | Admitting: Neurology

## 2018-05-17 ENCOUNTER — Telehealth: Payer: Self-pay | Admitting: Pulmonary Disease

## 2018-05-17 NOTE — Telephone Encounter (Signed)
1 Vial Order Date: 5.20.19 Shipping Date: 5.21.19

## 2018-05-18 ENCOUNTER — Telehealth: Payer: Self-pay | Admitting: Internal Medicine

## 2018-05-18 ENCOUNTER — Ambulatory Visit (INDEPENDENT_AMBULATORY_CARE_PROVIDER_SITE_OTHER): Payer: Medicare Other

## 2018-05-18 DIAGNOSIS — J4540 Moderate persistent asthma, uncomplicated: Secondary | ICD-10-CM | POA: Diagnosis not present

## 2018-05-18 NOTE — Telephone Encounter (Signed)
Pt would like to know when she can come in to receive her next Prolia injection. Please call patient

## 2018-05-18 NOTE — Telephone Encounter (Signed)
Left message explaining prolia process, reverifying insurance and patient will be due for next injection either on/after July 1st, 2019.  We will call patient back with summary of benefits and to schedule next injection a little closer to the date above

## 2018-05-18 NOTE — Telephone Encounter (Signed)
1 vial Arrival Date:05/18/18 Lot #: [id number] HWYS:[id number]

## 2018-05-19 MED ORDER — MEPOLIZUMAB 100 MG ~~LOC~~ SOLR
100.0000 mg | Freq: Once | SUBCUTANEOUS | Status: AC
  Administered 2018-05-18: 100 mg via SUBCUTANEOUS

## 2018-05-20 ENCOUNTER — Encounter: Payer: Self-pay | Admitting: Gastroenterology

## 2018-05-27 ENCOUNTER — Encounter: Payer: Self-pay | Admitting: Internal Medicine

## 2018-05-27 NOTE — Telephone Encounter (Signed)
Insurance submitted and verified. Patient has a $0 copay. Appointment scheduled 06/30/18.

## 2018-06-08 ENCOUNTER — Other Ambulatory Visit: Payer: Self-pay | Admitting: Internal Medicine

## 2018-06-09 ENCOUNTER — Telehealth: Payer: Self-pay | Admitting: Pulmonary Disease

## 2018-06-09 NOTE — Telephone Encounter (Signed)
1 Vial Order Date: 06/09/18 Shipping Date: 06/09/18

## 2018-06-11 NOTE — Telephone Encounter (Signed)
1 vial Arrival Date: 06/10/18 Lot #: [id number] Exp Date:12/2021

## 2018-06-15 ENCOUNTER — Encounter: Payer: Self-pay | Admitting: Internal Medicine

## 2018-06-15 DIAGNOSIS — R21 Rash and other nonspecific skin eruption: Secondary | ICD-10-CM

## 2018-06-18 ENCOUNTER — Ambulatory Visit (INDEPENDENT_AMBULATORY_CARE_PROVIDER_SITE_OTHER): Payer: Medicare Other

## 2018-06-18 DIAGNOSIS — J45998 Other asthma: Secondary | ICD-10-CM

## 2018-06-21 ENCOUNTER — Encounter: Payer: Self-pay | Admitting: Physical Medicine & Rehabilitation

## 2018-06-21 MED ORDER — MEPOLIZUMAB 100 MG ~~LOC~~ SOLR
100.0000 mg | Freq: Once | SUBCUTANEOUS | Status: AC
  Administered 2018-06-18: 100 mg via SUBCUTANEOUS

## 2018-06-25 ENCOUNTER — Other Ambulatory Visit: Payer: Self-pay | Admitting: Internal Medicine

## 2018-06-30 ENCOUNTER — Ambulatory Visit (INDEPENDENT_AMBULATORY_CARE_PROVIDER_SITE_OTHER): Payer: Medicare Other

## 2018-06-30 DIAGNOSIS — M810 Age-related osteoporosis without current pathological fracture: Secondary | ICD-10-CM

## 2018-06-30 MED ORDER — DENOSUMAB 60 MG/ML ~~LOC~~ SOSY
60.0000 mg | PREFILLED_SYRINGE | Freq: Once | SUBCUTANEOUS | Status: AC
  Administered 2018-06-30: 60 mg via SUBCUTANEOUS

## 2018-06-30 NOTE — Progress Notes (Signed)
prolia Injection given.   Oreoluwa Aigner J Teresa Nicodemus, MD  
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
## 2018-07-09 ENCOUNTER — Encounter: Payer: Self-pay | Admitting: Internal Medicine

## 2018-07-09 ENCOUNTER — Ambulatory Visit (INDEPENDENT_AMBULATORY_CARE_PROVIDER_SITE_OTHER): Payer: Medicare Other | Admitting: Internal Medicine

## 2018-07-09 ENCOUNTER — Other Ambulatory Visit (INDEPENDENT_AMBULATORY_CARE_PROVIDER_SITE_OTHER): Payer: Medicare Other

## 2018-07-09 ENCOUNTER — Telehealth: Payer: Self-pay | Admitting: Pulmonary Disease

## 2018-07-09 VITALS — BP 92/70 | HR 70 | Temp 98.3°F | Wt 160.1 lb

## 2018-07-09 DIAGNOSIS — B372 Candidiasis of skin and nail: Secondary | ICD-10-CM | POA: Insufficient documentation

## 2018-07-09 DIAGNOSIS — R42 Dizziness and giddiness: Secondary | ICD-10-CM

## 2018-07-09 DIAGNOSIS — R410 Disorientation, unspecified: Secondary | ICD-10-CM

## 2018-07-09 DIAGNOSIS — I959 Hypotension, unspecified: Secondary | ICD-10-CM | POA: Diagnosis not present

## 2018-07-09 LAB — CBC WITH DIFFERENTIAL/PLATELET
Basophils Absolute: 0 10*3/uL (ref 0.0–0.1)
Basophils Relative: 0.3 % (ref 0.0–3.0)
Eosinophils Absolute: 0.1 10*3/uL (ref 0.0–0.7)
Eosinophils Relative: 0.8 % (ref 0.0–5.0)
HCT: 38.9 % (ref 36.0–46.0)
Hemoglobin: 12.9 g/dL (ref 12.0–15.0)
Lymphocytes Relative: 11.3 % — ABNORMAL LOW (ref 12.0–46.0)
Lymphs Abs: 1.3 10*3/uL (ref 0.7–4.0)
MCHC: 33.3 g/dL (ref 30.0–36.0)
MCV: 91.2 fl (ref 78.0–100.0)
Monocytes Absolute: 1 10*3/uL (ref 0.1–1.0)
Monocytes Relative: 9 % (ref 3.0–12.0)
Neutro Abs: 8.8 10*3/uL — ABNORMAL HIGH (ref 1.4–7.7)
Neutrophils Relative %: 78.6 % — ABNORMAL HIGH (ref 43.0–77.0)
Platelets: 250 10*3/uL (ref 150.0–400.0)
RBC: 4.26 Mil/uL (ref 3.87–5.11)
RDW: 14.3 % (ref 11.5–15.5)
WBC: 11.2 10*3/uL — ABNORMAL HIGH (ref 4.0–10.5)

## 2018-07-09 LAB — URINALYSIS, ROUTINE W REFLEX MICROSCOPIC
Hgb urine dipstick: NEGATIVE
Ketones, ur: NEGATIVE
Nitrite: NEGATIVE
RBC / HPF: NONE SEEN (ref 0–?)
Specific Gravity, Urine: 1.025 (ref 1.000–1.030)
Total Protein, Urine: NEGATIVE
Urine Glucose: NEGATIVE
Urobilinogen, UA: 0.2 (ref 0.0–1.0)
pH: 6 (ref 5.0–8.0)

## 2018-07-09 LAB — COMPREHENSIVE METABOLIC PANEL
ALT: 19 U/L (ref 0–35)
AST: 23 U/L (ref 0–37)
Albumin: 4.3 g/dL (ref 3.5–5.2)
Alkaline Phosphatase: 54 U/L (ref 39–117)
BUN: 17 mg/dL (ref 6–23)
CO2: 28 mEq/L (ref 19–32)
Calcium: 9.5 mg/dL (ref 8.4–10.5)
Chloride: 105 mEq/L (ref 96–112)
Creatinine, Ser: 0.85 mg/dL (ref 0.40–1.20)
GFR: 69.26 mL/min (ref >60.00)
Glucose, Bld: 105 mg/dL — ABNORMAL HIGH (ref 70–99)
Potassium: 4.2 mEq/L (ref 3.5–5.1)
Sodium: 142 mEq/L (ref 135–145)
Total Bilirubin: 0.3 mg/dL (ref 0.2–1.2)
Total Protein: 7.7 g/dL (ref 6.0–8.3)

## 2018-07-09 LAB — TSH: TSH: 3.32 u[IU]/mL (ref 0.35–4.50)

## 2018-07-09 MED ORDER — NYSTATIN-TRIAMCINOLONE 100000-0.1 UNIT/GM-% EX OINT: 1.0000 "application " | TOPICAL_OINTMENT | Freq: Two times a day (BID) | CUTANEOUS | 5 refills | Status: DC

## 2018-07-09 NOTE — Patient Instructions (Addendum)
  Test(s) ordered today. Your results will be released to [place] (or called to you) after review, usually within 72hours after test completion. If any changes need to be made, you will be notified at that same time.   Medications reviewed and updated.  Changes include holding the losartan for now and monitor your BP.  We will need to restart this if your BP get up to the high 130's/80's.    Use more air conditioning.    Try a new cream for your rash.    Your prescription(s) have been submitted to your pharmacy. Please take as directed and contact our office if you believe you are having problem(s) with the medication(s).

## 2018-07-09 NOTE — Assessment & Plan Note (Addendum)
BP on low side -? cause Hold losartan for now Monitor BP at home If blood pressure becomes higher and may restart losartan at 50 mg daily

## 2018-07-09 NOTE — Telephone Encounter (Signed)
1 Vial Order Date: 07/09/18 Shipping Date: 07/11/18

## 2018-07-10 LAB — URINE CULTURE
MICRO NUMBER:: 90828368
Result:: NO GROWTH
SPECIMEN QUALITY:: ADEQUATE

## 2018-07-11 ENCOUNTER — Encounter: Payer: Self-pay | Admitting: Internal Medicine

## 2018-07-11 DIAGNOSIS — R410 Disorientation, unspecified: Secondary | ICD-10-CM | POA: Insufficient documentation

## 2018-07-11 NOTE — Assessment & Plan Note (Signed)
Mild confusion per son Confusion is not necessarily consistent Possibly related to dehydration, hypotension or infection-possibly UTI.  She denies any urinary symptoms, but will check urinalysis, urine culture Also check CBC, CMP and TSH to rule out other causes of confusion

## 2018-07-11 NOTE — Assessment & Plan Note (Signed)
She has a rash andMild itching in the creases under her breasts, in her groin and axilla regions Likely yeast Mycolog cream twice daily as needed, which she has used in the past and it has been effective Discussed prevention

## 2018-07-11 NOTE — Assessment & Plan Note (Signed)
She has been expensing some intermittent lightheadedness over the past week Likely related to hypotension and possible dehydration Will hold blood pressure medication-losartan Monitor blood pressure Increase fluids Check CMP, CBC Check urinalysis, urine culture-?  UTI that could be causing some of her symptoms as well

## 2018-07-12 ENCOUNTER — Ambulatory Visit: Payer: Medicare Other | Admitting: Internal Medicine

## 2018-07-12 NOTE — Telephone Encounter (Signed)
Arrival Date: 07/12/2018 # of Vials: 1 Lot #: [id number] Expiration Date: 12/2021

## 2018-07-14 ENCOUNTER — Encounter: Payer: Self-pay | Admitting: Emergency Medicine

## 2018-07-14 ENCOUNTER — Telehealth: Payer: Self-pay | Admitting: Emergency Medicine

## 2018-07-14 NOTE — Telephone Encounter (Signed)
Jury Duty Letter completed and placed upfront for pt to pick-up on Friday.

## 2018-07-16 ENCOUNTER — Ambulatory Visit (INDEPENDENT_AMBULATORY_CARE_PROVIDER_SITE_OTHER): Payer: Medicare Other | Admitting: Pulmonary Disease

## 2018-07-16 ENCOUNTER — Ambulatory Visit (INDEPENDENT_AMBULATORY_CARE_PROVIDER_SITE_OTHER): Payer: Medicare Other

## 2018-07-16 ENCOUNTER — Encounter: Payer: Self-pay | Admitting: Pulmonary Disease

## 2018-07-16 VITALS — BP 120/74 | HR 64 | Ht 60.0 in | Wt 163.2 lb

## 2018-07-16 DIAGNOSIS — J82 Pulmonary eosinophilia, not elsewhere classified: Secondary | ICD-10-CM

## 2018-07-16 DIAGNOSIS — J309 Allergic rhinitis, unspecified: Secondary | ICD-10-CM

## 2018-07-16 DIAGNOSIS — J4530 Mild persistent asthma, uncomplicated: Secondary | ICD-10-CM | POA: Diagnosis not present

## 2018-07-16 DIAGNOSIS — J8289 Other pulmonary eosinophilia, not elsewhere classified: Secondary | ICD-10-CM

## 2018-07-16 DIAGNOSIS — J45998 Other asthma: Secondary | ICD-10-CM

## 2018-07-16 NOTE — Telephone Encounter (Signed)
Reason for CRM: pt would like jury duty letter mailed to her. Please advise. Cb#[phone] [address] [name] [phone]    Letter has been mailed to patient.
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
## 2018-07-16 NOTE — Patient Instructions (Signed)
Prednisone 5 mg on one day, alternating with 2.5 mg on the other day.  Do this for pattern for 2 weeks.  If breathing symptoms are stable, then can change prednisone to 2.5 mg daily  Follow up in 3 months

## 2018-07-20 MED ORDER — MEPOLIZUMAB 100 MG ~~LOC~~ SOLR
100.0000 mg | Freq: Once | SUBCUTANEOUS | Status: AC
  Administered 2018-07-16: 100 mg via SUBCUTANEOUS

## 2018-07-27 ENCOUNTER — Ambulatory Visit: Payer: Medicare Other | Admitting: Internal Medicine

## 2018-07-28 ENCOUNTER — Ambulatory Visit (INDEPENDENT_AMBULATORY_CARE_PROVIDER_SITE_OTHER): Payer: Medicare Other | Admitting: Orthopedic Surgery

## 2018-07-28 ENCOUNTER — Encounter (INDEPENDENT_AMBULATORY_CARE_PROVIDER_SITE_OTHER): Payer: Self-pay | Admitting: Orthopedic Surgery

## 2018-07-28 DIAGNOSIS — M1712 Unilateral primary osteoarthritis, left knee: Secondary | ICD-10-CM | POA: Diagnosis not present

## 2018-07-28 DIAGNOSIS — M1711 Unilateral primary osteoarthritis, right knee: Secondary | ICD-10-CM | POA: Diagnosis not present

## 2018-07-28 MED ORDER — BUPIVACAINE HCL 0.25 % IJ SOLN
4.0000 mL | INTRAMUSCULAR | Status: AC | PRN
  Administered 2018-07-28: 4 mL via INTRA_ARTICULAR

## 2018-07-28 MED ORDER — METHYLPREDNISOLONE ACETATE 40 MG/ML IJ SUSP
40.0000 mg | INTRAMUSCULAR | Status: AC | PRN
  Administered 2018-07-28: 40 mg via INTRA_ARTICULAR

## 2018-07-28 MED ORDER — LIDOCAINE HCL 1 % IJ SOLN
5.0000 mL | INTRAMUSCULAR | Status: AC | PRN
  Administered 2018-07-28: 5 mL
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
## 2018-07-29 ENCOUNTER — Ambulatory Visit: Payer: Medicare Other | Admitting: Physical Medicine & Rehabilitation

## 2018-08-11 ENCOUNTER — Telehealth: Payer: Self-pay | Admitting: Pulmonary Disease

## 2018-08-11 NOTE — Telephone Encounter (Signed)
1 Vial Order Date: 08/11/18 Shipping Date: 08/11/18

## 2018-08-12 NOTE — Telephone Encounter (Signed)
1 vial Arrival Date:08/12/18 Lot #: [id number] Exp Date:01/2022

## 2018-08-13 ENCOUNTER — Ambulatory Visit (INDEPENDENT_AMBULATORY_CARE_PROVIDER_SITE_OTHER): Payer: Medicare Other

## 2018-08-13 DIAGNOSIS — J45998 Other asthma: Secondary | ICD-10-CM

## 2018-08-13 MED ORDER — MEPOLIZUMAB 100 MG ~~LOC~~ SOLR
100.0000 mg | Freq: Once | SUBCUTANEOUS | Status: AC
  Administered 2018-08-13: 100 mg via SUBCUTANEOUS

## 2018-08-18 ENCOUNTER — Encounter: Payer: Self-pay | Admitting: Internal Medicine

## 2018-08-21 ENCOUNTER — Encounter: Payer: Self-pay | Admitting: Internal Medicine

## 2018-08-21 MED ORDER — FLUCONAZOLE 150 MG PO TABS: ORAL_TABLET | ORAL | 0 refills | Status: DC

## 2018-08-22 ENCOUNTER — Other Ambulatory Visit: Payer: Self-pay | Admitting: Internal Medicine

## 2018-08-22 MED ORDER — FLUCONAZOLE 150 MG PO TABS: 150.0000 mg | ORAL_TABLET | Freq: Once | ORAL | 1 refills | Status: AC

## 2018-09-06 ENCOUNTER — Encounter: Payer: Self-pay | Admitting: Internal Medicine

## 2018-09-06 ENCOUNTER — Other Ambulatory Visit: Payer: Self-pay | Admitting: Internal Medicine

## 2018-09-06 DIAGNOSIS — R21 Rash and other nonspecific skin eruption: Secondary | ICD-10-CM

## 2018-09-06 NOTE — Telephone Encounter (Signed)
Did this help or not help -- this is typically given for a prolonged course.

## 2018-09-07 NOTE — Telephone Encounter (Signed)
Pt stated that it helped a lot per my chart message.

## 2018-09-07 NOTE — Telephone Encounter (Signed)
Will change dose to 200 mg daily for two weeks.  rx sent.  Have her come in for liver tests Friday or Monday if she can to make sure the medication is not affecting her liver.  ( ordered)

## 2018-09-16 DIAGNOSIS — L304 Erythema intertrigo: Secondary | ICD-10-CM | POA: Diagnosis not present

## 2018-09-16 DIAGNOSIS — B351 Tinea unguium: Secondary | ICD-10-CM | POA: Diagnosis not present

## 2018-09-27 ENCOUNTER — Encounter: Payer: Self-pay | Admitting: Internal Medicine

## 2018-09-30 ENCOUNTER — Other Ambulatory Visit: Payer: Self-pay | Admitting: Internal Medicine

## 2018-09-30 DIAGNOSIS — F419 Anxiety disorder, unspecified: Secondary | ICD-10-CM

## 2018-09-30 NOTE — Telephone Encounter (Signed)
Last refill was 03/29/18 Last OV was 07/09/18 Next OV is 11/09/18

## 2018-10-01 NOTE — Telephone Encounter (Signed)
MD approved and sent electronically to pof../lmb  

## 2018-10-05 ENCOUNTER — Telehealth: Payer: Self-pay

## 2018-10-05 NOTE — Telephone Encounter (Signed)
PA for Lorazepam was approved. Pharmacy is aware.  JNG:[id number]

## 2018-10-08 ENCOUNTER — Telehealth: Payer: Self-pay | Admitting: Pulmonary Disease

## 2018-10-08 NOTE — Telephone Encounter (Signed)
Spoke with the pt  She is asking about her next Nucala injection and when she is scheduled  I do not see an appt for this  Will forward to [place] for this, thanks

## 2018-10-08 NOTE — Telephone Encounter (Signed)
Noted-will be Monday before I call patient; need to look into Nucala information for patient. Appears last injection was 08/13/18; no medication on hand.

## 2018-10-08 NOTE — Telephone Encounter (Signed)
Pt returning call and can be reached @ [phone].Lisa Larson

## 2018-10-08 NOTE — Telephone Encounter (Signed)
LMTCB

## 2018-10-11 NOTE — Telephone Encounter (Signed)
Left message for patient-she needs to be scheduled for her Nucala injection. Last Injection date: 08/13/18; pt is over due for injection now; should be given every 4 weeks. Medication has been ordered.  1 Vial Order Date: 10.14.19 Shipping Date: 10.15.19

## 2018-10-11 NOTE — Telephone Encounter (Signed)
Lisa Larson,  Please see pt's mychart messages and advise when pt is due to have her next nucala injection. Pt had said eosinophil pneumonia injection but I clarified with her that it was supposed to be for nucala injection.  Please advise for pt. Thanks!

## 2018-10-11 NOTE — Telephone Encounter (Signed)
Pt returned call to the office-she has been scheduled to come in for her next Nucala injection on Wednesday 10-13-18 at 9:15am

## 2018-10-12 NOTE — Telephone Encounter (Signed)
1 vial Arrival Date: 10/12/18 Lot #: [id number] Exp Date: 10/12/18

## 2018-10-13 ENCOUNTER — Ambulatory Visit (INDEPENDENT_AMBULATORY_CARE_PROVIDER_SITE_OTHER): Payer: Medicare Other

## 2018-10-13 DIAGNOSIS — J4540 Moderate persistent asthma, uncomplicated: Secondary | ICD-10-CM

## 2018-10-13 DIAGNOSIS — J45998 Other asthma: Secondary | ICD-10-CM

## 2018-10-13 NOTE — Progress Notes (Signed)
Documented by Parke Poisson CMA based on hand-written Nucala documentation sheet completed by [facility] CMA, who administered the medication.   The patient answered the following questions per the hand-written documentation provided by Alroy Bailiff CMA: Have you had any changes to your insurance?  NO. Have you been in the hospital in the past 10 day?  NO. Do you have a cough or fever?  YES.  Dry. Any allergic reaction to past Nucala injections?  NO. Wheezing.

## 2018-10-18 ENCOUNTER — Ambulatory Visit (INDEPENDENT_AMBULATORY_CARE_PROVIDER_SITE_OTHER): Payer: Medicare Other | Admitting: Pulmonary Disease

## 2018-10-18 ENCOUNTER — Encounter: Payer: Self-pay | Admitting: Pulmonary Disease

## 2018-10-18 VITALS — BP 132/80 | HR 76 | Ht 60.0 in | Wt 157.0 lb

## 2018-10-18 DIAGNOSIS — Z23 Encounter for immunization: Secondary | ICD-10-CM | POA: Diagnosis not present

## 2018-10-18 DIAGNOSIS — J82 Pulmonary eosinophilia, not elsewhere classified: Secondary | ICD-10-CM | POA: Diagnosis not present

## 2018-10-18 DIAGNOSIS — J309 Allergic rhinitis, unspecified: Secondary | ICD-10-CM | POA: Diagnosis not present

## 2018-10-18 DIAGNOSIS — J45998 Other asthma: Secondary | ICD-10-CM | POA: Diagnosis not present

## 2018-10-18 DIAGNOSIS — J8289 Other pulmonary eosinophilia, not elsewhere classified: Secondary | ICD-10-CM

## 2018-10-18 NOTE — Addendum Note (Signed)
Addended by: Georjean Mode on: 10/18/2018 11:22 AM   Modules accepted: Orders

## 2018-10-18 NOTE — Patient Instructions (Signed)
High dose flu shot and pneumonia 23 shot today  Asmanex two puffs daily  Proair two puffs every 6 hours as needed for cough, wheeze, or chest congestion  Follow up in 4 months

## 2018-10-18 NOTE — Progress Notes (Signed)
Lisa Larson, Critical Care, and Sleep Medicine  Chief Complaint  Patient presents with  . Follow-up    Pt wheezing increase in last 3-4 weeks, pt is no asmanex nor proair. Pt is requesting to move the prednisone to 5mg  instead of 1/2 tab. Pt is requesting high dose flu shot today.    Constitutional:  BP 132/80 (BP Location: Left Arm, Cuff Size: Normal)   Pulse 76   Ht 5' (1.524 m)   Wt 157 lb (71.2 kg)   SpO2 97%   BMI 30.66 kg/m   Past Medical History:  Anemia, Anxiety, Arthritis, C2 fx, Cataract, Chronic headaches, Depression, GERD, Gout, Hiatal hernia, HTN, Osteoporosis, Urinary incontinence  Brief Summary:  Lisa Larson is a 77 y.o. 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  She has been wheezing more.  Has cough but not much sputum.  Started using asmanex again, but only prn.  Hasn't been using albuterol much.  Not having sinus congestion, sore throat, fever, chest pain, leg swelling, abdominal pain, or skin rash.  Physical Exam:   Appearance - well kempt  ENMT - clear nasal mucosa, midline nasal septum, no oral exudates, no LAN, trachea midline Respiratory - normal chest wall, normal respiratory effort, no accessory muscle use, no wheeze/rales CV - s1s2 regular rate and rhythm, no murmurs, no peripheral edema, radial pulses symmetric GI - soft, non tender, no masses Lymph - no adenopathy noted in neck and axillary areas MSK - normal muscle strength and tone, normal gait Ext - no cyanosis, clubbing, or joint inflammation noted Skin - no rashes, lesions, or ulcers Neuro - oriented to person, place, and time Psych - normal mood and affect  Discussion:  She c/o low energy level since she had reduction in dose of prednisone.  Some of this could be related to progression of asthma symptoms.  Another consideration could be adrenal suppression from long term prednisone use.  Assessment/Plan:   Eosinophilic  pneumonia. - continue prednisone 2.5 mg daily  Allergic asthma with eosinophilic phenotype. - resume asmanex daily with prn albuterol - she has spacer device - continue nucala - high dose flu shot pneumovax today  Allergic rhinitis. - prn flonase  Low energy level. - if symptoms persist after increasing asthma therapy, then might need further endocrine assessment with PCP   Patient Instructions  High dose flu shot and pneumonia 23 shot today  Asmanex two puffs daily  Proair two puffs every 6 hours as needed for cough, wheeze, or chest congestion  Follow up in 4 months    [name], MD [place] Pager: [phone] 10/18/2018, 10:37 AM  Flow Sheet     Larson tests:  CBC 01/14/11>>39% eosinophils 01/14/11>>HIV negative, ACE 39, RF negative, ANA 1:40, ANCA negative  CT chest 01/16/11>>Multifocal b/l ASD with GGO BAL 01/21/11>>48% Eosinophils  IgE 02/25/11>>282 CT chest 07/22/11>>resolution of ASD CT chest 10/03/11>>recurrence of nodular infiltrate Rt upper lobe PFT 11/04/12>>FEV1 1.77 (87%), FEV1% 68, TLC 4.87 (101%), DLCO 69%, +BD from FEF 25-75%. RAST 10/26/17 >> negative, IgE 108   Events:  March 2019 >> start nucala  Medications:   Allergies as of 10/18/2018      Reactions   Other Rash, Shortness Of Breath   Sulfonamide Derivatives Shortness Of Breath, Rash   Oxycodone-aspirin Other (See Comments)   Couldn't hear    Diclofenac Other (See Comments)   Unknown reaction    Rofecoxib Other (See Comments)   Unknown reaction  Ace Inhibitors Cough   Benazepril Hcl Other (See Comments)   No PMH of angioedema; ACE-I caused cough   Tramadol Itching      Medication List        Accurate as of 10/18/18 10:37 AM. Always use your most recent med list.          acetaminophen 500 MG tablet Commonly known as:  TYLENOL Take 500 mg by mouth 3 (three) times daily as needed.   albuterol 108 (90 Base) MCG/ACT inhaler Commonly  known as:  PROVENTIL HFA;VENTOLIN HFA Inhale 2 puffs into the lungs every 6 (six) hours as needed for wheezing or shortness of breath.   Cetirizine HCl 10 MG Caps Take 1 capsule (10 mg total) by mouth daily.   fluconazole 200 MG tablet Commonly known as:  DIFLUCAN Take 1 tablet (200 mg total) by mouth daily.   fluticasone 50 MCG/ACT nasal spray Commonly known as:  FLONASE Place 2 sprays into both nostrils daily.   gabapentin 300 MG capsule Commonly known as:  NEURONTIN Take 1 capsule (300 mg total) by mouth 3 (three) times daily.   LORazepam 1 MG tablet Commonly known as:  ATIVAN TAKE 1/2 TABLET BY MOUTH EVERY 8- 12 HOURS AS NEEDED   losartan 100 MG tablet Commonly known as:  COZAAR TAKE 1 TABLET(100 MG) BY MOUTH DAILY   meloxicam 7.5 MG tablet Commonly known as:  MOBIC TAKE 1 TABLET(7.5 MG) BY MOUTH DAILY   mometasone 220 MCG/INH inhaler Commonly known as:  ASMANEX Inhale 2 puffs into the lungs daily.   nystatin-triamcinolone ointment Commonly known as:  MYCOLOG Apply 1 application topically 2 (two) times daily.   omeprazole 40 MG capsule Commonly known as:  PRILOSEC Take 1 capsule (40 mg total) by mouth daily.   PHILLIPS COLON HEALTH PO Take 1 capsule by mouth daily as needed (For overall digestive health.).   predniSONE 5 MG tablet Commonly known as:  DELTASONE Take 1 tablet (5 mg total) by mouth daily with breakfast.   sertraline 50 MG tablet Commonly known as:  ZOLOFT TAKE 1 TABLET BY MOUTH EVERY DAY FOR 2 WEEKS THEN INCREASE TO 2 TABLETS BY MOUTH EVERY DAY   thiamine 100 MG tablet Commonly known as:  VITAMIN B-1 Take 100 mg by mouth daily as needed.   Vitamin D3 2000 units capsule Take 2,000 Units by mouth daily.       Past Surgical History:  She  has a past surgical history that includes Shoulder surgery (Left, 08-2008); Total abdominal hysterectomy; Tonsillectomy and adenoidectomy; colonoscopy with polypectomy (06/2013); Nasal sinus surgery;  Appendectomy; Esophageal dilation; Bronchoscopy (12-2010); Cataract extraction w/ intraocular lens  implant, bilateral (Bilateral); Knee arthroscopy (Right, 06/18/2015); Tonsillectomy; Cholecystectomy (N/A, 05/23/2016); Upper gastrointestinal endoscopy; Colonoscopy; and Polypectomy.  Family History:  Her family history includes Alzheimer's disease in her mother; Arthritis in her father; Cancer in her son; Colitis in her mother; Diabetes in her brother and paternal grandmother; Heart attack in her paternal grandmother; Hypertension in her brother, father, and mother; Irritable bowel syndrome in her mother; Non-Hodgkin's lymphoma in her brother; Other in her son; Larson embolism in her father; Rheum arthritis in her father.  Social History:  She  reports that she quit smoking about 49 years ago. Her smoking use included cigarettes. She has a 15.00 pack-year smoking history. She has never used smokeless tobacco. She reports that she does not drink alcohol or use drugs.

## 2018-10-26 ENCOUNTER — Telehealth: Payer: Self-pay | Admitting: Pulmonary Disease

## 2018-10-27 ENCOUNTER — Telehealth: Payer: Self-pay | Admitting: Pulmonary Disease

## 2018-10-27 NOTE — Telephone Encounter (Signed)
.  1 vial Arrival Date:10/27/18 Lot #: [id number] Exp Date:03/2022

## 2018-10-27 NOTE — Telephone Encounter (Signed)
1 Vial Order Date: 10/26/18 Shipping Date: 10/26/18 Created encounter for today, will record del. In it. Closing this one.

## 2018-10-31 ENCOUNTER — Other Ambulatory Visit: Payer: Self-pay | Admitting: Internal Medicine

## 2018-11-02 ENCOUNTER — Other Ambulatory Visit: Payer: Self-pay | Admitting: Pulmonary Disease

## 2018-11-07 ENCOUNTER — Encounter: Payer: Self-pay | Admitting: Internal Medicine
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
## 2018-11-07 NOTE — Patient Instructions (Signed)
  Tests ordered today. Your results will be released to [facility] (or called to you) after review, usually within 72hours after test completion. If any changes need to be made, you will be notified at that same time.  All other Health Maintenance issues reviewed.   All recommended immunizations and age-appropriate screenings are up-to-date or discussed.  No immunizations administered today.   Medications reviewed and updated.  Changes include :     Your prescription(s) have been submitted to your pharmacy. Please take as directed and contact our office if you believe you are having problem(s) with the medication(s).  A referral was ordered for   Please followup in 6 months

## 2018-11-09 ENCOUNTER — Encounter: Payer: Medicare Other | Admitting: Internal Medicine

## 2018-11-09 ENCOUNTER — Ambulatory Visit: Payer: Medicare Other

## 2018-11-10 ENCOUNTER — Ambulatory Visit: Payer: Medicare Other
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
## 2018-11-15 NOTE — Patient Instructions (Addendum)
  Tests ordered today. Your results will be released to [place] (or called to you) after review, usually within 72hours after test completion. If any changes need to be made, you will be notified at that same time.  A referral was ordered for GI.  Medications reviewed and updated.  Changes include :   Restart calcium 600 mg twice daily - it is ok if it has some vitamin D in it.  Continue your vitamin d pill in addition to the calcium/vitamin d combination.    We will consider reclast infusion (once a year) or fosamax at your next visit - both treat osteoporosis.     Please followup in 6 months

## 2018-11-16 ENCOUNTER — Other Ambulatory Visit (INDEPENDENT_AMBULATORY_CARE_PROVIDER_SITE_OTHER): Payer: Medicare Other

## 2018-11-16 ENCOUNTER — Encounter: Payer: Self-pay | Admitting: Internal Medicine

## 2018-11-16 ENCOUNTER — Ambulatory Visit (INDEPENDENT_AMBULATORY_CARE_PROVIDER_SITE_OTHER): Payer: Medicare Other | Admitting: *Deleted

## 2018-11-16 ENCOUNTER — Ambulatory Visit (INDEPENDENT_AMBULATORY_CARE_PROVIDER_SITE_OTHER): Payer: Medicare Other | Admitting: Internal Medicine

## 2018-11-16 ENCOUNTER — Ambulatory Visit: Payer: Medicare Other

## 2018-11-16 VITALS — BP 118/70 | HR 74 | Ht 60.0 in | Wt 157.0 lb

## 2018-11-16 VITALS — BP 118/70 | HR 74 | Temp 98.4°F | Resp 16 | Ht 60.0 in | Wt 157.4 lb

## 2018-11-16 DIAGNOSIS — E7849 Other hyperlipidemia: Secondary | ICD-10-CM

## 2018-11-16 DIAGNOSIS — M1990 Unspecified osteoarthritis, unspecified site: Secondary | ICD-10-CM

## 2018-11-16 DIAGNOSIS — H9193 Unspecified hearing loss, bilateral: Secondary | ICD-10-CM | POA: Insufficient documentation

## 2018-11-16 DIAGNOSIS — M810 Age-related osteoporosis without current pathological fracture: Secondary | ICD-10-CM

## 2018-11-16 DIAGNOSIS — R739 Hyperglycemia, unspecified: Secondary | ICD-10-CM

## 2018-11-16 DIAGNOSIS — K219 Gastro-esophageal reflux disease without esophagitis: Secondary | ICD-10-CM

## 2018-11-16 DIAGNOSIS — I10 Essential (primary) hypertension: Secondary | ICD-10-CM

## 2018-11-16 DIAGNOSIS — Z0000 Encounter for general adult medical examination without abnormal findings: Secondary | ICD-10-CM | POA: Diagnosis not present

## 2018-11-16 DIAGNOSIS — F3289 Other specified depressive episodes: Secondary | ICD-10-CM

## 2018-11-16 DIAGNOSIS — K635 Polyp of colon: Secondary | ICD-10-CM | POA: Diagnosis not present

## 2018-11-16 DIAGNOSIS — F411 Generalized anxiety disorder: Secondary | ICD-10-CM | POA: Diagnosis not present

## 2018-11-16 LAB — CBC WITH DIFFERENTIAL/PLATELET
Basophils Absolute: 0 10*3/uL (ref 0.0–0.1)
Basophils Relative: 0.6 % (ref 0.0–3.0)
Eosinophils Absolute: 0.1 10*3/uL (ref 0.0–0.7)
Eosinophils Relative: 1.5 % (ref 0.0–5.0)
HCT: 40 % (ref 36.0–46.0)
Hemoglobin: 13.3 g/dL (ref 12.0–15.0)
Lymphocytes Relative: 16.4 % (ref 12.0–46.0)
Lymphs Abs: 1.2 10*3/uL (ref 0.7–4.0)
MCHC: 33.2 g/dL (ref 30.0–36.0)
MCV: 95.3 fl (ref 78.0–100.0)
Monocytes Absolute: 0.8 10*3/uL (ref 0.1–1.0)
Monocytes Relative: 11.3 % (ref 3.0–12.0)
Neutro Abs: 5.1 10*3/uL (ref 1.4–7.7)
Neutrophils Relative %: 70.2 % (ref 43.0–77.0)
Platelets: 212 10*3/uL (ref 150.0–400.0)
RBC: 4.2 Mil/uL (ref 3.87–5.11)
RDW: 14.1 % (ref 11.5–15.5)
WBC: 7.3 10*3/uL (ref 4.0–10.5)

## 2018-11-16 LAB — LIPID PANEL
Cholesterol: 213 mg/dL — ABNORMAL HIGH (ref 0–200)
HDL: 66.4 mg/dL (ref >39.00)
NonHDL: 146.5
Total CHOL/HDL Ratio: 3
Triglycerides: 253 mg/dL — ABNORMAL HIGH (ref 0.0–149.0)
VLDL: 50.6 mg/dL — ABNORMAL HIGH (ref 0.0–40.0)

## 2018-11-16 LAB — COMPREHENSIVE METABOLIC PANEL
ALT: 21 U/L (ref 0–35)
AST: 29 U/L (ref 0–37)
Albumin: 4.5 g/dL (ref 3.5–5.2)
Alkaline Phosphatase: 43 U/L (ref 39–117)
BUN: 18 mg/dL (ref 6–23)
CO2: 28 mEq/L (ref 19–32)
Calcium: 10 mg/dL (ref 8.4–10.5)
Chloride: 103 mEq/L (ref 96–112)
Creatinine, Ser: 0.94 mg/dL (ref 0.40–1.20)
GFR: 61.61 mL/min (ref >60.00)
Glucose, Bld: 98 mg/dL (ref 70–99)
Potassium: 4.6 mEq/L (ref 3.5–5.1)
Sodium: 139 mEq/L (ref 135–145)
Total Bilirubin: 0.4 mg/dL (ref 0.2–1.2)
Total Protein: 7.4 g/dL (ref 6.0–8.3)

## 2018-11-16 LAB — LDL CHOLESTEROL, DIRECT: Direct LDL: 121 mg/dL

## 2018-11-16 LAB — TSH: TSH: 2.31 u[IU]/mL (ref 0.35–4.50)

## 2018-11-16 LAB — HEMOGLOBIN A1C: Hgb A1c MFr Bld: 5.2 % (ref 4.6–6.5)
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
## 2018-11-16 NOTE — Assessment & Plan Note (Signed)
Taking tylenol and meloxicam

## 2018-11-16 NOTE — Assessment & Plan Note (Addendum)
Not taking calcium BID  Continue vitamin d  Stop prolia - due to possible side effects Restart fosamax or consider reclast - will discuss at her next visit

## 2018-11-16 NOTE — Assessment & Plan Note (Signed)
a1c

## 2018-11-16 NOTE — Assessment & Plan Note (Signed)
Dec hearing Referred to audiology

## 2018-11-16 NOTE — Assessment & Plan Note (Signed)
BP well controlled Current regimen effective and well tolerated Continue current medications at current doses cmp  

## 2018-11-16 NOTE — Patient Instructions (Addendum)
Continue doing brain stimulating activities (puzzles, reading, adult coloring books, staying active) to keep memory sharp.   Continue to eat heart healthy diet (full of fruits, vegetables, whole grains, lean protein, water--limit salt, fat, and sugar intake) and increase physical activity as tolerated.   Lisa Larson , Thank you for taking time to come for your Medicare Wellness Visit. I appreciate your ongoing commitment to your health goals. Please review the following plan we discussed and let me know if I can assist you in the future.   These are the goals we discussed: Goals      Patient Stated   . patient (pt-stated)     Looking forward to having less pain and doing more      Other   . lose weight     Reduce the amount of Lisa Larson I drink, eat healthy, increase my exercise.     . Patient Stated     I will do the chair exercise and get better sleep.       This is a list of the screening recommended for you and due dates:  Health Maintenance  Topic Date Due  . Colon Cancer Screening  06/29/2018  . Tetanus Vaccine  04/05/2023  . Flu Shot  Completed  . DEXA scan (bone density measurement)  Completed  . Pneumonia vaccines  Completed     Health Maintenance, Female Adopting a healthy lifestyle and getting preventive care can go a long way to promote health and wellness. Talk with your health care provider about what schedule of regular examinations is right for you. This is a good chance for you to check in with your provider about disease prevention and staying healthy. In between checkups, there are plenty of things you can do on your own. Experts have done a lot of research about which lifestyle changes and preventive measures are most likely to keep you healthy. Ask your health care provider for more information. Weight and diet Eat a healthy diet  Be sure to include plenty of vegetables, fruits, low-fat dairy products, and lean protein.  Do not eat a lot of foods high in  solid fats, added sugars, or salt.  Get regular exercise. This is one of the most important things you can do for your health. ? Most adults should exercise for at least 150 minutes each week. The exercise should increase your heart rate and make you sweat (moderate-intensity exercise). ? Most adults should also do strengthening exercises at least twice a week. This is in addition to the moderate-intensity exercise.  Maintain a healthy weight  Body mass index (BMI) is a measurement that can be used to identify possible weight problems. It estimates body fat based on height and weight. Your health care provider can help determine your BMI and help you achieve or maintain a healthy weight.  For females 72 years of age and older: ? A BMI below 18.5 is considered underweight. ? A BMI of 18.5 to 24.9 is normal. ? A BMI of 25 to 29.9 is considered overweight. ? A BMI of 30 and above is considered obese.  Watch levels of cholesterol and blood lipids  You should start having your blood tested for lipids and cholesterol at 75 years of age, then have this test every 5 years.  You may need to have your cholesterol levels checked more often if: ? Your lipid or cholesterol levels are high. ? You are older than 75 years of age. ? You are at high risk  for heart disease.  Cancer screening Lung Cancer  Lung cancer screening is recommended for adults 23-50 years old who are at high risk for lung cancer because of a history of smoking.  A yearly low-dose CT scan of the lungs is recommended for people who: ? Currently smoke. ? Have quit within the past 15 years. ? Have at least a 30-pack-year history of smoking. A pack year is smoking an average of one pack of cigarettes a day for 1 year.  Yearly screening should continue until it has been 15 years since you quit.  Yearly screening should stop if you develop a health problem that would prevent you from having lung cancer treatment.  Breast  Cancer  Practice breast self-awareness. This means understanding how your breasts normally appear and feel.  It also means doing regular breast self-exams. Let your health care provider know about any changes, no matter how small.  If you are in your 20s or 30s, you should have a clinical breast exam (CBE) by a health care provider every 1-3 years as part of a regular health exam.  If you are 44 or older, have a CBE every year. Also consider having a breast X-ray (mammogram) every year.  If you have a family history of breast cancer, talk to your health care provider about genetic screening.  If you are at high risk for breast cancer, talk to your health care provider about having an MRI and a mammogram every year.  Breast cancer gene (BRCA) assessment is recommended for women who have family members with BRCA-related cancers. BRCA-related cancers include: ? Breast. ? Ovarian. ? Tubal. ? Peritoneal cancers.  Results of the assessment will determine the need for genetic counseling and BRCA1 and BRCA2 testing.  Cervical Cancer Your health care provider may recommend that you be screened regularly for cancer of the pelvic organs (ovaries, uterus, and vagina). This screening involves a pelvic examination, including checking for microscopic changes to the surface of your cervix (Pap test). You may be encouraged to have this screening done every 3 years, beginning at age 36.  For women ages 33-65, health care providers may recommend pelvic exams and Pap testing every 3 years, or they may recommend the Pap and pelvic exam, combined with testing for human papilloma virus (HPV), every 5 years. Some types of HPV increase your risk of cervical cancer. Testing for HPV may also be done on women of any age with unclear Pap test results.  Other health care providers may not recommend any screening for nonpregnant women who are considered low risk for pelvic cancer and who do not have symptoms. Ask your  health care provider if a screening pelvic exam is right for you.  If you have had past treatment for cervical cancer or a condition that could lead to cancer, you need Pap tests and screening for cancer for at least 20 years after your treatment. If Pap tests have been discontinued, your risk factors (such as having a new sexual partner) need to be reassessed to determine if screening should resume. Some women have medical problems that increase the chance of getting cervical cancer. In these cases, your health care provider may recommend more frequent screening and Pap tests.  Colorectal Cancer  This type of cancer can be detected and often prevented.  Routine colorectal cancer screening usually begins at 75 years of age and continues through 75 years of age.  Your health care provider may recommend screening at an earlier age if  you have risk factors for colon cancer.  Your health care provider may also recommend using home test kits to check for hidden blood in the stool.  A small camera at the end of a tube can be used to examine your colon directly (sigmoidoscopy or colonoscopy). This is done to check for the earliest forms of colorectal cancer.  Routine screening usually begins at age 25.  Direct examination of the colon should be repeated every 5-10 years through 75 years of age. However, you may need to be screened more often if early forms of precancerous polyps or small growths are found.  Skin Cancer  Check your skin from head to toe regularly.  Tell your health care provider about any new moles or changes in moles, especially if there is a change in a mole's shape or color.  Also tell your health care provider if you have a mole that is larger than the size of a pencil eraser.  Always use sunscreen. Apply sunscreen liberally and repeatedly throughout the day.  Protect yourself by wearing long sleeves, pants, a wide-brimmed hat, and sunglasses whenever you are  outside.  Heart disease, diabetes, and high blood pressure  High blood pressure causes heart disease and increases the risk of stroke. High blood pressure is more likely to develop in: ? People who have blood pressure in the high end of the normal range (130-139/85-89 mm Hg). ? People who are overweight or obese. ? People who are African American.  If you are 66-61 years of age, have your blood pressure checked every 3-5 years. If you are 11 years of age or older, have your blood pressure checked every year. You should have your blood pressure measured twice-once when you are at a hospital or clinic, and once when you are not at a hospital or clinic. Record the average of the two measurements. To check your blood pressure when you are not at a hospital or clinic, you can use: ? An automated blood pressure machine at a pharmacy. ? A home blood pressure monitor.  If you are between 29 years and 31 years old, ask your health care provider if you should take aspirin to prevent strokes.  Have regular diabetes screenings. This involves taking a blood sample to check your fasting blood sugar level. ? If you are at a normal weight and have a low risk for diabetes, have this test once every three years after 75 years of age. ? If you are overweight and have a high risk for diabetes, consider being tested at a younger age or more often. Preventing infection Hepatitis B  If you have a higher risk for hepatitis B, you should be screened for this virus. You are considered at high risk for hepatitis B if: ? You were born in a country where hepatitis B is common. Ask your health care provider which countries are considered high risk. ? Your parents were born in a high-risk country, and you have not been immunized against hepatitis B (hepatitis B vaccine). ? You have HIV or AIDS. ? You use needles to inject street drugs. ? You live with someone who has hepatitis B. ? You have had sex with someone who has  hepatitis B. ? You get hemodialysis treatment. ? You take certain medicines for conditions, including cancer, organ transplantation, and autoimmune conditions.  Hepatitis C  Blood testing is recommended for: ? Everyone born from [birth year] through 1963. ? Anyone with known risk factors for hepatitis C.  Sexually  transmitted infections (STIs)  You should be screened for sexually transmitted infections (STIs) including gonorrhea and chlamydia if: ? You are sexually active and are younger than 75 years of age. ? You are older than 75 years of age and your health care provider tells you that you are at risk for this type of infection. ? Your sexual activity has changed since you were last screened and you are at an increased risk for chlamydia or gonorrhea. Ask your health care provider if you are at risk.  If you do not have HIV, but are at risk, it may be recommended that you take a prescription medicine daily to prevent HIV infection. This is called pre-exposure prophylaxis (PrEP). You are considered at risk if: ? You are sexually active and do not regularly use condoms or know the HIV status of your partner(s). ? You take drugs by injection. ? You are sexually active with a partner who has HIV.  Talk with your health care provider about whether you are at high risk of being infected with HIV. If you choose to begin PrEP, you should first be tested for HIV. You should then be tested every 3 months for as long as you are taking PrEP. Pregnancy  If you are premenopausal and you may become pregnant, ask your health care provider about preconception counseling.  If you may become pregnant, take 400 to 800 micrograms (mcg) of folic acid every day.  If you want to prevent pregnancy, talk to your health care provider about birth control (contraception). Osteoporosis and menopause  Osteoporosis is a disease in which the bones lose minerals and strength with aging. This can result in serious bone  fractures. Your risk for osteoporosis can be identified using a bone density scan.  If you are 62 years of age or older, or if you are at risk for osteoporosis and fractures, ask your health care provider if you should be screened.  Ask your health care provider whether you should take a calcium or vitamin D supplement to lower your risk for osteoporosis.  Menopause may have certain physical symptoms and risks.  Hormone replacement therapy may reduce some of these symptoms and risks. Talk to your health care provider about whether hormone replacement therapy is right for you. Follow these instructions at home:  Schedule regular health, dental, and eye exams.  Stay current with your immunizations.  Do not use any tobacco products including cigarettes, chewing tobacco, or electronic cigarettes.  If you are pregnant, do not drink alcohol.  If you are breastfeeding, limit how much and how often you drink alcohol.  Limit alcohol intake to no more than 1 drink per day for nonpregnant women. One drink equals 12 ounces of beer, 5 ounces of Ravi Tuccillo, or 1 ounces of hard liquor.  Do not use street drugs.  Do not share needles.  Ask your health care provider for help if you need support or information about quitting drugs.  Tell your health care provider if you often feel depressed.  Tell your health care provider if you have ever been abused or do not feel safe at home. This information is not intended to replace advice given to you by your health care provider. Make sure you discuss any questions you have with your health care provider. Document Released: 06/30/2011 Document Revised: 05/22/2016 Document Reviewed: 09/18/2015 Elsevier Interactive Patient Education  [employer].

## 2018-11-16 NOTE — Assessment & Plan Note (Signed)
Check lipid panel  Not on medication Regular exercise and healthy diet encouraged

## 2018-11-16 NOTE — Assessment & Plan Note (Signed)
Controlled, stable Continue current dose of medication  

## 2018-11-16 NOTE — Assessment & Plan Note (Signed)
Occasional GERD Takes tums as needed

## 2018-11-17 ENCOUNTER — Encounter: Payer: Self-pay | Admitting: Internal Medicine

## 2018-11-18 ENCOUNTER — Ambulatory Visit (INDEPENDENT_AMBULATORY_CARE_PROVIDER_SITE_OTHER): Payer: Medicare Other

## 2018-11-18 DIAGNOSIS — J4540 Moderate persistent asthma, uncomplicated: Secondary | ICD-10-CM

## 2018-11-18 MED ORDER — MEPOLIZUMAB 100 MG ~~LOC~~ SOLR
100.0000 mg | Freq: Once | SUBCUTANEOUS | Status: AC
  Administered 2018-11-18: 100 mg via SUBCUTANEOUS

## 2018-11-18 NOTE — Progress Notes (Signed)
Documented by Parke Poisson CMA based on hand-written Nucala documentation sheet completed by [facility] CMA, who administered the medication.

## 2018-12-01 ENCOUNTER — Other Ambulatory Visit: Payer: Self-pay | Admitting: Internal Medicine

## 2018-12-02 ENCOUNTER — Ambulatory Visit: Payer: Self-pay

## 2018-12-02 NOTE — Telephone Encounter (Signed)
Phone call ret'd to pt.  Reported she takes Tylenol 500 mg, 2 tabs TID for arthritis.  Stated "I might be taking this wrong, because my medication list says to take 500 mg, 1 tab, three times a day, as needed."   Reported "I usually take the three doses at 8:00 AM, 12:00-1:00 PM, and in the evening sometime."   Advised of the maximum dose of 3000 mg in 24 hr. period.  Explained that taking Tylenol 500 mg., 2 tablets TID stays within the 3000 mg max. Dose, but exceeds the amt. that was prescribed, by a historical provider.  Suggested to try to take Tylenol 500 mg, 1 tablet 2 times/ day, and, if needed, to take 2 tablets at bedtime.  Advised to call back if she has problems with worsening arthritic pain. Verb. Understanding.    Pt. also requested to review her recent lipid panel numbers with her, and explain what she could do to make them better.  Reviewed MyChart message of 11/21, per Dr. Quay Burow, and explained healthy dietary choices.  Verb. Understanding.             Reason for Disposition . Caller has medication question only, adult not sick, and triager answers question  Answer Assessment - Initial Assessment Questions 1. SYMPTOMS: "Do you have any symptoms?"     Arthritic pain in arms, back, legs 2. SEVERITY: If symptoms are present, ask "Are they mild, moderate or severe?"     Reported mild-moderate.  Protocols used: MEDICATION QUESTION CALL-A-AH  Message from [facility] sent at 12/02/2018 9:01 AM EST   Patient is calling to state she needs a better understanding of acetaminophen (TYLENOL) 500 MG tablet- she stated she unsure how to take this medication she has been taking 2 pills daily.

## 2018-12-03 ENCOUNTER — Encounter (INDEPENDENT_AMBULATORY_CARE_PROVIDER_SITE_OTHER): Payer: Self-pay | Admitting: Orthopedic Surgery

## 2018-12-05 ENCOUNTER — Other Ambulatory Visit: Payer: Self-pay | Admitting: Internal Medicine

## 2018-12-05 ENCOUNTER — Encounter: Payer: Self-pay | Admitting: Internal Medicine

## 2018-12-05 DIAGNOSIS — M810 Age-related osteoporosis without current pathological fracture: Secondary | ICD-10-CM

## 2018-12-06 ENCOUNTER — Encounter: Payer: Self-pay | Admitting: Gastroenterology

## 2018-12-06 NOTE — Telephone Encounter (Signed)
He may need to find out what exactly she is talking about.  Is she is talking about a heel lift or I am not sure what she is dealing with.  She may have to bring them by when she has and then we can go from there

## 2018-12-07 ENCOUNTER — Encounter (INDEPENDENT_AMBULATORY_CARE_PROVIDER_SITE_OTHER): Payer: Self-pay | Admitting: Orthopedic Surgery

## 2018-12-09 DIAGNOSIS — S12112A Nondisplaced Type II dens fracture, initial encounter for closed fracture: Secondary | ICD-10-CM | POA: Diagnosis not present

## 2018-12-13 ENCOUNTER — Telehealth: Payer: Self-pay | Admitting: *Deleted

## 2018-12-13 NOTE — Telephone Encounter (Signed)
1 Vial Order Date: 12/13/18 Shipping Date: 12/13/18

## 2018-12-14 NOTE — Telephone Encounter (Signed)
1 vial Arrival Date:12/14/18 Lot #: [id number] Exp Date:04/2022

## 2018-12-20 ENCOUNTER — Ambulatory Visit: Payer: Medicare Other

## 2018-12-23 ENCOUNTER — Ambulatory Visit (INDEPENDENT_AMBULATORY_CARE_PROVIDER_SITE_OTHER): Payer: Medicare Other

## 2018-12-23 DIAGNOSIS — J4540 Moderate persistent asthma, uncomplicated: Secondary | ICD-10-CM | POA: Diagnosis not present

## 2018-12-24 MED ORDER — MEPOLIZUMAB 100 MG ~~LOC~~ SOLR
100.0000 mg | Freq: Once | SUBCUTANEOUS | Status: AC
  Administered 2018-12-23: 100 mg via SUBCUTANEOUS

## 2018-12-27 ENCOUNTER — Encounter: Payer: Self-pay | Admitting: Internal Medicine

## 2018-12-27 DIAGNOSIS — M2550 Pain in unspecified joint: Secondary | ICD-10-CM

## 2018-12-27 DIAGNOSIS — M791 Myalgia, unspecified site: Secondary | ICD-10-CM

## 2018-12-30 ENCOUNTER — Other Ambulatory Visit: Payer: Self-pay | Admitting: Internal Medicine

## 2018-12-30 ENCOUNTER — Other Ambulatory Visit: Payer: Self-pay | Admitting: Pulmonary Disease

## 2019-01-12 ENCOUNTER — Telehealth: Payer: Self-pay | Admitting: Pulmonary Disease

## 2019-01-13 ENCOUNTER — Telehealth: Payer: Self-pay | Admitting: Pulmonary Disease

## 2019-01-13 NOTE — Telephone Encounter (Signed)
Error

## 2019-01-13 NOTE — Telephone Encounter (Signed)
1 Vial Order Date: 01/12/2019 Shipping Date: 01/12/2019

## 2019-01-13 NOTE — Telephone Encounter (Signed)
1 vial Arrival Date:01/13/2019 Lot #: [id number]

## 2019-01-18 DIAGNOSIS — M5030 Other cervical disc degeneration, unspecified cervical region: Secondary | ICD-10-CM | POA: Diagnosis not present

## 2019-01-18 DIAGNOSIS — M19041 Primary osteoarthritis, right hand: Secondary | ICD-10-CM | POA: Diagnosis not present

## 2019-01-18 DIAGNOSIS — M79641 Pain in right hand: Secondary | ICD-10-CM | POA: Diagnosis not present

## 2019-01-18 DIAGNOSIS — Z8739 Personal history of other diseases of the musculoskeletal system and connective tissue: Secondary | ICD-10-CM | POA: Diagnosis not present

## 2019-01-18 DIAGNOSIS — M064 Inflammatory polyarthropathy: Secondary | ICD-10-CM | POA: Diagnosis not present

## 2019-01-18 DIAGNOSIS — M79642 Pain in left hand: Secondary | ICD-10-CM | POA: Diagnosis not present

## 2019-01-18 DIAGNOSIS — Z7952 Long term (current) use of systemic steroids: Secondary | ICD-10-CM | POA: Diagnosis not present

## 2019-01-18 DIAGNOSIS — M5136 Other intervertebral disc degeneration, lumbar region: Secondary | ICD-10-CM | POA: Diagnosis not present

## 2019-01-18 DIAGNOSIS — M1990 Unspecified osteoarthritis, unspecified site: Secondary | ICD-10-CM | POA: Diagnosis not present

## 2019-01-18 DIAGNOSIS — M542 Cervicalgia: Secondary | ICD-10-CM | POA: Diagnosis not present

## 2019-01-18 DIAGNOSIS — M791 Myalgia, unspecified site: Secondary | ICD-10-CM | POA: Diagnosis not present

## 2019-01-18 DIAGNOSIS — M549 Dorsalgia, unspecified: Secondary | ICD-10-CM | POA: Diagnosis not present

## 2019-01-18 DIAGNOSIS — M2550 Pain in unspecified joint: Secondary | ICD-10-CM | POA: Diagnosis not present

## 2019-01-18 DIAGNOSIS — M19042 Primary osteoarthritis, left hand: Secondary | ICD-10-CM | POA: Diagnosis not present

## 2019-01-19 ENCOUNTER — Telehealth: Payer: Self-pay | Admitting: Pulmonary Disease

## 2019-01-20 ENCOUNTER — Telehealth: Payer: Self-pay | Admitting: Pulmonary Disease

## 2019-01-20 ENCOUNTER — Ambulatory Visit (INDEPENDENT_AMBULATORY_CARE_PROVIDER_SITE_OTHER): Payer: Medicare Other | Admitting: *Deleted

## 2019-01-20 DIAGNOSIS — J4540 Moderate persistent asthma, uncomplicated: Secondary | ICD-10-CM

## 2019-01-20 MED ORDER — MEPOLIZUMAB 100 MG ~~LOC~~ SOLR
100.0000 mg | Freq: Once | SUBCUTANEOUS | Status: AC
  Administered 2019-01-20: 100 mg via SUBCUTANEOUS

## 2019-01-20 NOTE — Telephone Encounter (Signed)
This is likely from allergies.  She can get nucala injection.

## 2019-01-20 NOTE — Telephone Encounter (Signed)
Spoke with the pt  She is scheduled for Nucala inj at 10:30 am today  She is concerned b/c she has had nasal congestion with yellow nasal d/c x 2 wks  No f/c/s, sore throat, cough, SOB, aches or other co's  She is asking if she should still get her inj  Please advise thanks

## 2019-01-20 NOTE — Telephone Encounter (Signed)
I spoke with the pt and notified of recs per VS  She verbalized understanding  Nothing further needed

## 2019-01-20 NOTE — Telephone Encounter (Signed)
Kathlee Nations I went back to check my work in [place] and I was in wrap up. Can you delete the other charge.

## 2019-01-25 ENCOUNTER — Ambulatory Visit: Payer: Medicare Other | Admitting: Gastroenterology

## 2019-02-03 DIAGNOSIS — M791 Myalgia, unspecified site: Secondary | ICD-10-CM | POA: Diagnosis not present

## 2019-02-03 DIAGNOSIS — M064 Inflammatory polyarthropathy: Secondary | ICD-10-CM | POA: Diagnosis not present

## 2019-02-03 DIAGNOSIS — Z7952 Long term (current) use of systemic steroids: Secondary | ICD-10-CM | POA: Diagnosis not present

## 2019-02-03 DIAGNOSIS — M542 Cervicalgia: Secondary | ICD-10-CM | POA: Diagnosis not present

## 2019-02-03 DIAGNOSIS — M2550 Pain in unspecified joint: Secondary | ICD-10-CM | POA: Diagnosis not present

## 2019-02-03 DIAGNOSIS — M5030 Other cervical disc degeneration, unspecified cervical region: Secondary | ICD-10-CM | POA: Diagnosis not present

## 2019-02-03 DIAGNOSIS — Z8739 Personal history of other diseases of the musculoskeletal system and connective tissue: Secondary | ICD-10-CM | POA: Diagnosis not present

## 2019-02-03 DIAGNOSIS — M1990 Unspecified osteoarthritis, unspecified site: Secondary | ICD-10-CM | POA: Diagnosis not present

## 2019-02-03 DIAGNOSIS — M549 Dorsalgia, unspecified: Secondary | ICD-10-CM | POA: Diagnosis not present

## 2019-02-03 DIAGNOSIS — M5136 Other intervertebral disc degeneration, lumbar region: Secondary | ICD-10-CM | POA: Diagnosis not present

## 2019-02-07 ENCOUNTER — Telehealth: Payer: Self-pay | Admitting: Pulmonary Disease

## 2019-02-07 NOTE — Telephone Encounter (Signed)
1 Vial Order Date: 2.10.2020 Shipping Date: 2.12.2020

## 2019-02-08 ENCOUNTER — Telehealth: Payer: Self-pay

## 2019-02-08 ENCOUNTER — Ambulatory Visit (INDEPENDENT_AMBULATORY_CARE_PROVIDER_SITE_OTHER): Payer: Medicare Other | Admitting: Allergy and Immunology

## 2019-02-08 ENCOUNTER — Encounter: Payer: Self-pay | Admitting: Allergy and Immunology

## 2019-02-08 VITALS — BP 118/70 | HR 76 | Temp 98.1°F | Resp 18 | Ht 59.5 in | Wt 159.0 lb

## 2019-02-08 DIAGNOSIS — J4550 Severe persistent asthma, uncomplicated: Secondary | ICD-10-CM

## 2019-02-08 DIAGNOSIS — M818 Other osteoporosis without current pathological fracture: Secondary | ICD-10-CM | POA: Diagnosis not present

## 2019-02-08 DIAGNOSIS — K219 Gastro-esophageal reflux disease without esophagitis: Secondary | ICD-10-CM | POA: Diagnosis not present

## 2019-02-08 DIAGNOSIS — J3089 Other allergic rhinitis: Secondary | ICD-10-CM | POA: Diagnosis not present

## 2019-02-08 MED ORDER — RANITIDINE HCL 300 MG PO TABS: 300.0000 mg | ORAL_TABLET | Freq: Every day | ORAL | 5 refills | Status: DC

## 2019-02-08 MED ORDER — FAMOTIDINE 40 MG PO TABS: 40.0000 mg | ORAL_TABLET | Freq: Every day | ORAL | 5 refills | Status: DC

## 2019-02-08 NOTE — Addendum Note (Signed)
Addended by: Lucrezia Starch I on: 02/08/2019 02:06 PM   Modules accepted: Orders

## 2019-02-08 NOTE — Telephone Encounter (Signed)
Patient's insurance will not cover famotidine unless patient tries/fails cimetidine & ranitidine. Please advise and thank you.

## 2019-02-08 NOTE — Patient Instructions (Addendum)
  1.  Continue to treat inflammation:   A.  Mepolizumab injection every 28 days  B.  Prednisone 5 mg daily  C.  Asmanex? Flonase?  2.  Treat reflux:   A.  Consolidate caffeinated drink consumption as much as possible  B.  Consolidate chocolate consumption as much as possible  C.  Famotidine 40 mg -1 tablet 1 time per day  3.  Treat osteoporosis:   A.  Continue calcium and vitamin D supplementation  B.  Weightbearing exercise at gym (Silver sneakers)  4.  If needed:   A.  Zyrtec 10 mg tablet 1 time per day  B.  Nasal saline 1 time per day  C.  Pro-air HFA 2 inhalations every 4-6 hours  5.  Starting April 1 decrease prednisone to 4 mg daily, then June 1 decrease prednisone to 3 mg daily, then August 1 decrease prednisone to 2 mg daily, then October 1 decrease prednisone to 1 mg daily, then discontinue prednisone November 29, 2019  6.  Need for stress steroids?  7.  Return to clinic July 2020 or earlier if problem

## 2019-02-08 NOTE — Telephone Encounter (Signed)
Please have her replace famotidine with ranitidine 300mg 

## 2019-02-08 NOTE — Telephone Encounter (Signed)
Prescription has been changed as instructed.  
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
## 2019-02-09 ENCOUNTER — Encounter: Payer: Self-pay | Admitting: Allergy and Immunology

## 2019-02-10 NOTE — Telephone Encounter (Signed)
1 vial Arrival Date:12/11/2019 Lot #: [id number]

## 2019-02-14 ENCOUNTER — Encounter: Payer: Self-pay | Admitting: Allergy and Immunology

## 2019-02-17 ENCOUNTER — Ambulatory Visit (INDEPENDENT_AMBULATORY_CARE_PROVIDER_SITE_OTHER): Payer: Medicare Other

## 2019-02-17 DIAGNOSIS — J4540 Moderate persistent asthma, uncomplicated: Secondary | ICD-10-CM | POA: Diagnosis not present

## 2019-02-22 MED ORDER — MEPOLIZUMAB 100 MG ~~LOC~~ SOLR
100.0000 mg | Freq: Once | SUBCUTANEOUS | Status: AC
  Administered 2019-02-17: 100 mg via SUBCUTANEOUS

## 2019-02-24 NOTE — Telephone Encounter (Signed)
Routing to Kathlee Nations to see if this has been taken care of.

## 2019-03-03 ENCOUNTER — Encounter: Payer: Self-pay | Admitting: Allergy and Immunology

## 2019-03-03 ENCOUNTER — Telehealth: Payer: Self-pay

## 2019-03-03 MED ORDER — PREDNISONE 5 MG PO TABS: 5.0000 mg | ORAL_TABLET | Freq: Every day | ORAL | 0 refills | Status: DC

## 2019-03-03 NOTE — Telephone Encounter (Signed)
Routing to Tammy scott. 

## 2019-03-03 NOTE — Telephone Encounter (Signed)
-----   Message from Jiles Prows, MD sent at 03/03/2019  5:15 PM EST ----- Please contact patient and address the issue that she had communicated via a email.  She apparently may be somewhat short on her systemic steroid use or maybe we did not call out the prescription for systemic steroids.  Please make sure that she gets the appropriate amount of medication required to have her undergo her prednisone taper.

## 2019-03-07 ENCOUNTER — Ambulatory Visit (INDEPENDENT_AMBULATORY_CARE_PROVIDER_SITE_OTHER): Payer: Medicare Other | Admitting: Pulmonary Disease

## 2019-03-07 ENCOUNTER — Encounter: Payer: Self-pay | Admitting: Pulmonary Disease

## 2019-03-07 ENCOUNTER — Telehealth: Payer: Self-pay | Admitting: Pulmonary Disease

## 2019-03-07 VITALS — BP 128/60 | HR 74 | Ht 60.0 in | Wt 151.6 lb

## 2019-03-07 DIAGNOSIS — J4550 Severe persistent asthma, uncomplicated: Secondary | ICD-10-CM | POA: Diagnosis not present

## 2019-03-07 DIAGNOSIS — J82 Pulmonary eosinophilia, not elsewhere classified: Secondary | ICD-10-CM | POA: Diagnosis not present

## 2019-03-07 DIAGNOSIS — J8289 Other pulmonary eosinophilia, not elsewhere classified: Secondary | ICD-10-CM

## 2019-03-07 DIAGNOSIS — J45998 Other asthma: Secondary | ICD-10-CM | POA: Diagnosis not present

## 2019-03-07 NOTE — Patient Instructions (Signed)
Follow up in 6 months 

## 2019-03-07 NOTE — Progress Notes (Signed)
[facility]  Chief Complaint  Patient presents with  . Follow-up    She is not feeling well having some trouble at this time.     Constitutional:  BP 128/60 (BP Location: Right Arm, Patient Position: Sitting, Cuff Size: Normal)   Pulse 74   Ht 5' (1.524 m)   Wt 151 lb 9.6 oz (68.8 kg)   SpO2 94%   BMI 29.61 kg/m   Past Medical History:  Anemia, Anxiety, Arthritis, C2 fx, Cataract, Chronic headaches, Depression, GERD, Gout, Hiatal hernia, HTN, Osteoporosis, Urinary incontinence  Brief Summary:  Lisa Larson is a 78 y.o. 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  She had trouble with prednisone taper.  Seen by allergy and rheumatology.  Has slower prednisone taper.  Main issue is with fatigue and joint pain.  Labs from rheumatology reviewed. Eosinophil level normal.  Not having sinus congestion, cough, wheeze, sputum, or skin rash.  Doesn't feel like she needs inhalers much.  Physical Exam:   Appearance - sitting in wheelchair  ENMT - no sinus tenderness, no nasal discharge, no oral exudate  Neck - no masses, trachea midline, no thyromegaly, no elevation in JVP  Respiratory - normal appearance of chest wall, normal respiratory effort w/o accessory muscle use, no dullness on percussion, no wheezing or rales  CV - s1s2 regular rate and rhythm, no murmurs, no peripheral edema, radial pulses symmetric  GI - soft, non tender  Lymph - no adenopathy noted in neck and axillary areas  MSK - changes of osteoarthritis  Ext - no cyanosis, clubbing, or joint inflammation noted  Skin - no rashes, lesions, or ulcers  Neuro - normal strength, oriented x 3  Psych - normal mood and affect   Assessment/Plan:   Eosinophilic pneumonia. - she has instructions for gradual tapering off of prednisone  Allergic asthma with eosinophilic phenotype. - improved since starting  nucala - prn asmanex and albuterol  Allergic rhinitis. - prn flonase - sees Dr. Neldon Mc  Osteoarthritis. - main source of complaints today - f/u with PCP and rheumatology (Dr. Dossie Der)   Patient Instructions  Follow up in 6 months  A total of  26 minutes were spent face to face with the patient and more than half of that time involved counseling or coordination of care.   [name], MD [facility] Pager: [phone] 03/07/2019, 11:11 AM  Flow Sheet     Pulmonary tests:  CBC 01/14/11>>39% eosinophils 01/14/11>>HIV negative, ACE 39, RF negative, ANA 1:40, ANCA negative  CT chest 01/16/11>>Multifocal b/l ASD with GGO BAL 01/21/11>>48% Eosinophils  IgE 02/25/11>>282 CT chest 07/22/11>>resolution of ASD CT chest 10/03/11>>recurrence of nodular infiltrate Rt upper lobe PFT 11/04/12>>FEV1 1.77 (87%), FEV1% 68, TLC 4.87 (101%), DLCO 69%, +BD from FEF 25-75%. RAST 10/26/17 >> negative, IgE 108  Events:  March 2019 >> start nucala  Medications:   Allergies as of 03/07/2019      Reactions   Other Rash, Shortness Of Breath   Sulfonamide Derivatives Shortness Of Breath, Rash   Oxycodone-aspirin Other (See Comments)   Couldn't hear    Diclofenac Other (See Comments)   Unknown reaction    Prolia [denosumab]    Muscle pain, bone pain   Rofecoxib Other (See Comments)   Unknown reaction    Ace Inhibitors Cough   Benazepril Hcl Other (See Comments)   No PMH of angioedema; ACE-I caused cough   Tramadol Itching  Medication List       Accurate as of March 07, 2019 11:11 AM. Always use your most recent med list.        acetaminophen 500 MG tablet Commonly known as:  TYLENOL Take 500 mg by mouth 3 (three) times daily as needed.   albuterol 108 (90 Base) MCG/ACT inhaler Commonly known as:  ProAir HFA Inhale 2 puffs into the lungs every 6 (six) hours as needed for wheezing or shortness of breath.   calcium carbonate 600 MG tablet Commonly known as:   OS-CAL Take 600 mg by mouth 2 (two) times daily.   Cetirizine HCl 10 MG Caps Take 1 capsule (10 mg total) by mouth daily.   fluconazole 200 MG tablet Commonly known as:  DIFLUCAN Take 1 tablet (200 mg total) by mouth daily.   fluticasone 50 MCG/ACT nasal spray Commonly known as:  FLONASE Place 2 sprays into both nostrils daily.   gabapentin 300 MG capsule Commonly known as:  NEURONTIN Take 1 capsule (300 mg total) by mouth 3 (three) times daily.   LORazepam 1 MG tablet Commonly known as:  ATIVAN TAKE 1/2 TABLET BY MOUTH EVERY 8- 12 HOURS AS NEEDED   losartan 100 MG tablet Commonly known as:  COZAAR TAKE 1 TABLET(100 MG) BY MOUTH DAILY   meloxicam 7.5 MG tablet Commonly known as:  MOBIC TAKE 1 TABLET(7.5 MG) BY MOUTH DAILY   mometasone 220 MCG/INH inhaler Commonly known as:  Asmanex (60 Metered Doses) Inhale 2 puffs into the lungs daily.   nystatin-triamcinolone ointment Commonly known as:  MYCOLOG Apply 1 application topically 2 (two) times daily.   PHILLIPS COLON HEALTH PO Take 1 capsule by mouth daily as needed (For overall digestive health.).   predniSONE 5 MG tablet Commonly known as:  DELTASONE Take 1 tablet (5 mg total) by mouth daily with breakfast.   ranitidine 300 MG tablet Commonly known as:  ZANTAC Take 1 tablet (300 mg total) by mouth daily.   sertraline 50 MG tablet Commonly known as:  ZOLOFT Take 2 tablets (100 mg total) by mouth daily.   thiamine 100 MG tablet Commonly known as:  VITAMIN B-1 Take 100 mg by mouth daily as needed.   Vitamin D3 50 MCG (2000 UT) capsule Take 2,000 Units by mouth daily.       Past Surgical History:  She  has a past surgical history that includes Shoulder surgery (Left, 08-2008); Total abdominal hysterectomy; Tonsillectomy and adenoidectomy; colonoscopy with polypectomy (06/2013); Nasal sinus surgery; Appendectomy; Esophageal dilation; Bronchoscopy (12-2010); Cataract extraction w/ intraocular lens  implant,  bilateral (Bilateral); Knee arthroscopy (Right, 06/18/2015); Tonsillectomy; Cholecystectomy (N/A, 05/23/2016); Upper gastrointestinal endoscopy; Colonoscopy; Polypectomy; and Adenoidectomy.  Family History:  Her family history includes Alzheimer's disease in her mother; Arthritis in her father; Cancer in her son; Colitis in her mother; Diabetes in her brother and paternal grandmother; Heart attack in her paternal grandmother; Hypertension in her brother, father, and mother; Irritable bowel syndrome in her mother; Non-Hodgkin's lymphoma in her brother; Other in her son; Pulmonary embolism in her father; Rheum arthritis in her father.  Social History:  She  reports that she quit smoking about 50 years ago. Her smoking use included cigarettes. She has a 15.00 pack-year smoking history. She has never used smokeless tobacco. She reports that she does not drink alcohol or use drugs.

## 2019-03-08 NOTE — Telephone Encounter (Signed)
Per protocol from [facility] will route this over to the injection pool.

## 2019-03-08 NOTE — Telephone Encounter (Signed)
Called pt back she is scheduled for 03/17/2019 at 10:00. Nothing further needed.

## 2019-03-08 NOTE — Telephone Encounter (Signed)
Done

## 2019-03-10 ENCOUNTER — Encounter (INDEPENDENT_AMBULATORY_CARE_PROVIDER_SITE_OTHER): Payer: Self-pay | Admitting: Orthopedic Surgery

## 2019-03-14 ENCOUNTER — Telehealth: Payer: Self-pay | Admitting: Pulmonary Disease

## 2019-03-15 NOTE — Telephone Encounter (Signed)
1 Vial Order Date: 03/14/2019 Shipping Date: 03/14/2019  1 vial Arrival Date:03/15/2019 Lot #: [id number] Exp Date:08/2022

## 2019-03-17 ENCOUNTER — Other Ambulatory Visit: Payer: Self-pay

## 2019-03-17 ENCOUNTER — Ambulatory Visit (INDEPENDENT_AMBULATORY_CARE_PROVIDER_SITE_OTHER): Payer: Medicare Other

## 2019-03-17 DIAGNOSIS — J45998 Other asthma: Secondary | ICD-10-CM

## 2019-03-17 MED ORDER — MEPOLIZUMAB 100 MG ~~LOC~~ SOLR
100.0000 mg | Freq: Once | SUBCUTANEOUS | Status: AC
  Administered 2019-03-17: 100 mg via SUBCUTANEOUS

## 2019-03-20 ENCOUNTER — Other Ambulatory Visit: Payer: Self-pay | Admitting: Internal Medicine

## 2019-03-22 ENCOUNTER — Ambulatory Visit (INDEPENDENT_AMBULATORY_CARE_PROVIDER_SITE_OTHER): Payer: Medicare Other | Admitting: Orthopaedic Surgery

## 2019-03-22 ENCOUNTER — Other Ambulatory Visit: Payer: Self-pay

## 2019-03-22 ENCOUNTER — Ambulatory Visit (INDEPENDENT_AMBULATORY_CARE_PROVIDER_SITE_OTHER): Payer: Self-pay

## 2019-03-22 ENCOUNTER — Encounter (INDEPENDENT_AMBULATORY_CARE_PROVIDER_SITE_OTHER): Payer: Self-pay | Admitting: Family Medicine

## 2019-03-22 ENCOUNTER — Ambulatory Visit (INDEPENDENT_AMBULATORY_CARE_PROVIDER_SITE_OTHER): Payer: Medicare Other | Admitting: Family Medicine

## 2019-03-22 DIAGNOSIS — M1712 Unilateral primary osteoarthritis, left knee: Secondary | ICD-10-CM

## 2019-03-22 DIAGNOSIS — M25512 Pain in left shoulder: Secondary | ICD-10-CM

## 2019-03-22 DIAGNOSIS — M25552 Pain in left hip: Secondary | ICD-10-CM | POA: Diagnosis not present

## 2019-03-22 DIAGNOSIS — M1711 Unilateral primary osteoarthritis, right knee: Secondary | ICD-10-CM

## 2019-03-22 DIAGNOSIS — M25551 Pain in right hip: Secondary | ICD-10-CM

## 2019-03-22 MED ORDER — METHYLPREDNISOLONE ACETATE 40 MG/ML IJ SUSP: 40.0000 mg | Freq: Once | INTRAMUSCULAR | Status: DC

## 2019-03-22 NOTE — Patient Instructions (Signed)
   Left shoulder:  Nothing broken.  Lots of arthritis.  Right hip:  Bone on bone arthritis.  Left hip:  Moderate arthritis.   For weight loss:  Try to minimize intake of breads; pastas; cereals; sugars/sweets.

## 2019-03-22 NOTE — Progress Notes (Signed)
I saw and examined the patient with Dr. Okey Dupre and agree with assessment and plan as outlined.  Bilateral knee cortisone injections today.  Gel injection approval for future.  Left shoulder glenohumeral DJD, end-stage right hip DJD and moderate left hip DJD on x-ray today.  Pt will try to lose weight to help.

## 2019-03-22 NOTE — Progress Notes (Signed)
  Lisa Larson - 78 y.o. female MRN [id number]  Date of birth: 08-12-1941    SUBJECTIVE:      Chief Complaint: bilateral knee pain   HPI:  78 yr old female with bilateral knee pain.  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  She reports worsening pain recently.  She denies any specific recent injuries or falls.  She feels she has some mild swelling in the knees. Denies any erythema. No fevers or chills. She uses a rolling walker and/or cane to ambulate.   She also reports worsening bilateral hip pain. She relates this to starting after receiving a Prolia injection.  Pain is in both hips.  No recent falls.  No distal numbness or tingling.  No localized erythema, bruising, swelling, or skin changes.   ROS:     See HPI. All other reviewed systems negative.  PERTINENT  PMH / PSH FH / / SH:  Past Medical, Surgical, Social, and Family History Reviewed & Updated in the EMR.   OBJECTIVE: There were no vitals taken for this visit.  Physical Exam:  Vital signs are reviewed.  GEN: Alert and oriented, NAD Pulm: Breathing unlabored PSY: normal mood, congruent affect  MSK: Right Knee: - Inspection: hypertrophic changes.  Mild effusion. No erythema or bruising. Skin intact - Palpation: Diffuse tenderness - ROM: normal ROM - Strength: normal strength - Neuro/vasc: NV intact distally  Left Knee: - Inspection: hypertrophic changes.  Mild effusion. No erythema or bruising. Skin intact - Palpation: Diffuse tenderness - ROM: normal ROM - Strength: normal strength - Neuro/vasc: NV intact distally  Right hip: No obvious deformity Pain with passive IR/ER   Left hip: No obvious deformity Pain with passive IR/ER     ASSESSMENT & PLAN:  1. Bilateral knee pain -secondary to arthritis.  Steroid injection performed today as described below.  Patient would like to look into physical supplementation.  Follow-up as needed  2.  Bilateral hip pain.  No recent  injury.  X-rays obtained today show severe degenerative changes in the right hip.  Moderate-severe degenerative changes in the left hip.  3.  Chronic left shoulder pain-x-rays obtained today.  Patient has history of proximal humeral fracture.  The resulting bone healing is evident today on x-ray.  Additionally, she has evidence of advanced glenohumeral arthritis.     Procedure performed: knee intraarticular corticosteroid injection; palpation guided Consent obtained and verified. Time-out conducted.  LEFT KNEE: Noted no overlying erythema, induration, or other signs of local infection. The LEFT lateral patellofemoral joint space was palpated and marked. The overlying skin was prepped in a sterile fashion. Topical analgesic spray: Ethyl chloride. Needle: 25GA, 1.5" Completed without difficulty. Meds: Depo-Medrol 40 mg, lidocaine 5 cc  RIGHT KNEE Noted no overlying erythema, induration, or other signs of local infection. The RIGHT lateral patellofemoral joint space was palpated and marked. The overlying skin was prepped in a sterile fashion. Topical analgesic spray: Ethyl chloride. Needle: 25GA, 1.5" Completed without difficulty. Meds: Depo-Medrol 40 mg, lidocaine 5 cc

## 2019-03-23 ENCOUNTER — Ambulatory Visit (INDEPENDENT_AMBULATORY_CARE_PROVIDER_SITE_OTHER): Payer: Medicare Other | Admitting: Orthopedic Surgery

## 2019-03-29 ENCOUNTER — Telehealth: Payer: Self-pay | Admitting: *Deleted

## 2019-03-29 ENCOUNTER — Encounter: Payer: Self-pay | Admitting: Allergy and Immunology

## 2019-03-29 ENCOUNTER — Other Ambulatory Visit: Payer: Self-pay | Admitting: *Deleted

## 2019-03-29 MED ORDER — PREDNISONE 1 MG PO TABS: 4.0000 mg | ORAL_TABLET | Freq: Every day | ORAL | 1 refills | Status: DC

## 2019-03-29 NOTE — Telephone Encounter (Signed)
Starting April 1 decrease prednisone to 4 mg daily, then June 1 decrease prednisone to 3 mg daily, then August 1 decrease prednisone to 2 mg daily, then October 1 decrease prednisone to 1 mg daily, then discontinue prednisone November 29, 2019   Script sent in for April and May Prednisone doses.   Mychart message sent to patient.

## 2019-03-29 NOTE — Telephone Encounter (Signed)
-----   Message from Jiles Prows, MD sent at 03/29/2019  3:52 PM EDT ----- Starting April 1 decrease prednisone to 4 mg daily, then June 1 decrease prednisone to 3 mg daily, then August 1 decrease prednisone to 2 mg daily, then October 1 decrease prednisone to 1 mg daily, then discontinue prednisone November 29, 2019

## 2019-04-04 ENCOUNTER — Telehealth: Payer: Self-pay | Admitting: Pulmonary Disease

## 2019-04-04 NOTE — Telephone Encounter (Signed)
1 Vial Order Date: 04/04/2019 Shipping Date: 04/04/2019

## 2019-04-05 ENCOUNTER — Telehealth (INDEPENDENT_AMBULATORY_CARE_PROVIDER_SITE_OTHER): Payer: Self-pay

## 2019-04-05 NOTE — Telephone Encounter (Signed)
1 vial Arrival Date:04/05/2019 Lot #: [id number] Exp Date:08/2022  Nucala rc'd [id number]

## 2019-04-05 NOTE — Telephone Encounter (Signed)
Submitted VOB for Monovisc, bilateral knee. 

## 2019-04-06 ENCOUNTER — Encounter: Payer: Self-pay | Admitting: Internal Medicine

## 2019-04-06 ENCOUNTER — Telehealth (INDEPENDENT_AMBULATORY_CARE_PROVIDER_SITE_OTHER): Payer: Self-pay

## 2019-04-06 NOTE — Telephone Encounter (Signed)
Talked with patient and advised her that she is approved for gel injection.  Approved for Monovisc, bilateral knee. Buy & Bill Covered at 100% through her insurance. No Co-pay No PA required  Appt. 04/07/2019 Talked with patient and she answered NO to all COVID-19 Screening questions.

## 2019-04-07 ENCOUNTER — Ambulatory Visit (INDEPENDENT_AMBULATORY_CARE_PROVIDER_SITE_OTHER): Payer: Medicare Other | Admitting: Family Medicine

## 2019-04-12 ENCOUNTER — Telehealth: Payer: Self-pay

## 2019-04-12 NOTE — Telephone Encounter (Signed)
Can someone call her to see what she needs, I do not see an appointment with me .

## 2019-04-12 NOTE — Telephone Encounter (Signed)
Explained to patient Lisa Larson gives her the shot . Explained that their are multiple Tammy's that work in this ofifce. She will come on Thursday and meet both Tammy's for clarification  No asthma flare or fever . So if still okay on 4/16 may proceed with injection

## 2019-04-12 NOTE — Telephone Encounter (Signed)
Lisa Larson, pt is requesting you call her regarding her appt on 4/16.

## 2019-04-12 NOTE — Telephone Encounter (Signed)
Call made to patient to address email, patient states she has already talked with Alroy Bailiff and she is aware her medication is here but she wants to talk to [facility] because she is familiar with her and gives her the injections every month. I made the patient aware that typically Alroy Bailiff gives injections. While talking to the patient I made her aware I could hear her wheezing and that it sounded like she needed a Tele-Visit. I attempted to gather more information but patient declined and repeatedly states she only wants to talk to [facility]. I again tried to re-direct the patient and inform her that I was only  trying to figure out what she needed so we could address her concerns but she continued to insist she only wanted to talk to [facility] despite anything I said.

## 2019-04-13 NOTE — Telephone Encounter (Signed)
See phone note from 04/12/2019.

## 2019-04-14 ENCOUNTER — Ambulatory Visit (INDEPENDENT_AMBULATORY_CARE_PROVIDER_SITE_OTHER): Payer: Medicare Other

## 2019-04-14 ENCOUNTER — Encounter: Payer: Self-pay | Admitting: Pulmonary Disease

## 2019-04-14 ENCOUNTER — Other Ambulatory Visit: Payer: Self-pay

## 2019-04-14 DIAGNOSIS — J4550 Severe persistent asthma, uncomplicated: Secondary | ICD-10-CM

## 2019-04-14 DIAGNOSIS — J4540 Moderate persistent asthma, uncomplicated: Secondary | ICD-10-CM

## 2019-04-14 NOTE — Progress Notes (Signed)
Have you been hospitalized within the last 10 days?  No Do you have a fever?  No Do you have a cough?  No Do you have a headache or sore throat? No  

## 2019-04-27 ENCOUNTER — Encounter (INDEPENDENT_AMBULATORY_CARE_PROVIDER_SITE_OTHER): Payer: Self-pay | Admitting: Family Medicine

## 2019-04-27 ENCOUNTER — Encounter: Payer: Self-pay | Admitting: Allergy and Immunology

## 2019-04-27 DIAGNOSIS — M25552 Pain in left hip: Secondary | ICD-10-CM

## 2019-04-27 DIAGNOSIS — M25512 Pain in left shoulder: Secondary | ICD-10-CM

## 2019-04-27 DIAGNOSIS — M25551 Pain in right hip: Secondary | ICD-10-CM

## 2019-04-27 DIAGNOSIS — G8929 Other chronic pain: Secondary | ICD-10-CM

## 2019-04-27 DIAGNOSIS — M1712 Unilateral primary osteoarthritis, left knee: Secondary | ICD-10-CM

## 2019-04-27 DIAGNOSIS — M1711 Unilateral primary osteoarthritis, right knee: Secondary | ICD-10-CM

## 2019-04-28 ENCOUNTER — Encounter: Payer: Self-pay | Admitting: Allergy and Immunology

## 2019-04-28 ENCOUNTER — Other Ambulatory Visit: Payer: Self-pay | Admitting: *Deleted

## 2019-04-28 MED ORDER — PREDNISONE 1 MG PO TABS: 4.0000 mg | ORAL_TABLET | Freq: Every day | ORAL | 1 refills | Status: AC

## 2019-04-28 MED ORDER — PREDNISONE 5 MG PO TABS: 5.0000 mg | ORAL_TABLET | Freq: Every day | ORAL | 3 refills | Status: DC

## 2019-05-02 ENCOUNTER — Telehealth: Payer: Self-pay | Admitting: Pulmonary Disease

## 2019-05-02 NOTE — Telephone Encounter (Signed)
Nucala Order: 100mg  #1 Vial Order Date: 05/02/2019 Expected date of arrival: 05/03/2019 Ordered by: Desmond Dike, [place]: Nigel Mormon

## 2019-05-03 NOTE — Telephone Encounter (Signed)
Nucala Shipment Received: 100mg  #1 vial Medication arrival date:05/03/2019 Lot #: [id number] Medication exp date:09/2022 Received by: Desmond Dike, CMA

## 2019-05-12 ENCOUNTER — Other Ambulatory Visit: Payer: Self-pay

## 2019-05-12 ENCOUNTER — Telehealth: Payer: Self-pay

## 2019-05-12 ENCOUNTER — Ambulatory Visit (INDEPENDENT_AMBULATORY_CARE_PROVIDER_SITE_OTHER): Payer: Medicare Other

## 2019-05-12 DIAGNOSIS — J4550 Severe persistent asthma, uncomplicated: Secondary | ICD-10-CM

## 2019-05-12 MED ORDER — MEPOLIZUMAB 100 MG ~~LOC~~ SOLR
100.0000 mg | Freq: Once | SUBCUTANEOUS | Status: AC
  Administered 2019-05-12: 15:00:00 100 mg via SUBCUTANEOUS

## 2019-05-12 NOTE — Telephone Encounter (Signed)
Returned call to patient. LMTCB  Per March visit VS recommended follow-up in 6 months

## 2019-05-12 NOTE — Progress Notes (Signed)
Have you been hospitalized within the last 10 days?  No Do you have a fever?  No Do you have a cough?  No Do you have a headache or sore throat? No  

## 2019-05-12 NOTE — Telephone Encounter (Signed)
Returned call to patient Made aware of  6 mos f/u Placed recall Nothing further needed.

## 2019-05-18 ENCOUNTER — Encounter: Payer: Self-pay | Admitting: Internal Medicine
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
## 2019-05-19 ENCOUNTER — Ambulatory Visit (INDEPENDENT_AMBULATORY_CARE_PROVIDER_SITE_OTHER): Payer: Medicare Other | Admitting: Internal Medicine

## 2019-05-19 ENCOUNTER — Other Ambulatory Visit: Payer: Self-pay

## 2019-05-19 ENCOUNTER — Encounter: Payer: Self-pay | Admitting: Internal Medicine

## 2019-05-19 ENCOUNTER — Telehealth: Payer: Self-pay | Admitting: Family

## 2019-05-19 VITALS — BP 140/80 | HR 67 | Temp 97.9°F | Ht 60.0 in | Wt 157.0 lb

## 2019-05-19 DIAGNOSIS — M818 Other osteoporosis without current pathological fracture: Secondary | ICD-10-CM

## 2019-05-19 DIAGNOSIS — F3289 Other specified depressive episodes: Secondary | ICD-10-CM

## 2019-05-19 DIAGNOSIS — I10 Essential (primary) hypertension: Secondary | ICD-10-CM

## 2019-05-19 DIAGNOSIS — M1990 Unspecified osteoarthritis, unspecified site: Secondary | ICD-10-CM | POA: Diagnosis not present

## 2019-05-19 DIAGNOSIS — F411 Generalized anxiety disorder: Secondary | ICD-10-CM

## 2019-05-19 MED ORDER — SERTRALINE HCL 50 MG PO TABS: 150.0000 mg | ORAL_TABLET | Freq: Every day | ORAL | 1 refills | Status: DC

## 2019-05-19 MED ORDER — HYDROXYZINE HCL 25 MG PO TABS: 25.0000 mg | ORAL_TABLET | Freq: Every evening | ORAL | 0 refills | Status: DC | PRN

## 2019-05-19 MED ORDER — FAMOTIDINE 40 MG PO TABS: 40.0000 mg | ORAL_TABLET | Freq: Every day | ORAL | 1 refills | Status: DC

## 2019-05-19 NOTE — Assessment & Plan Note (Signed)
She does feel somewhat depressed and feels is related to the coronavirus situation When we discussed possibly increasing her dose of sertraline she was interested in trying it Increase sertraline to 150 mg daily

## 2019-05-19 NOTE — Assessment & Plan Note (Signed)
Overall anxiety is controlled Sertraline increased due to uncontrolled depression Continue lorazepam as needed

## 2019-05-19 NOTE — Assessment & Plan Note (Addendum)
Has diffuse arthritis pain with severe bone-on-bone arthritis in her hips and knees She is following with orthopedics and does get some injections in her knees She is not a candidate for surgery Currently taking Tylenol 500 mg 3 times daily, meloxicam 7.5 mg daily and gabapentin 3 times daily She is also on prednisone and is slowly tapering off Advised her to increase Tylenol to 1000 mg 3 times daily She has tried some topical arthritic medications, but did not continue them because of the smell-advised her to retry them I would not want her to be on anything stronger than what she is already on and hopefully the increase in Tylenol will help

## 2019-05-19 NOTE — Assessment & Plan Note (Signed)
BP Readings from Last 3 Encounters:  05/19/19 140/80  03/07/19 128/60  02/08/19 118/70   BP well controlled Current regimen effective and well tolerated Continue current medications at current doses

## 2019-05-19 NOTE — Telephone Encounter (Signed)
error 

## 2019-05-19 NOTE — Telephone Encounter (Deleted)
Copied from [place] [id number]. Topic: General - Other >> May 18, 2019  5:25 PM Alanda Slim E wrote: Reason for CRM: Pt returning taylors call/ please advise about appt

## 2019-05-19 NOTE — Patient Instructions (Addendum)
  Tests ordered today. Your results will be released to [place] (or called to you) after review, usually within 72hours after test completion. If any changes need to be made, you will be notified at that same time.   Medications reviewed and updated.  Changes include :   Take 1000 mg Tylenol three times a day.   Increase the sertraline to 150 mg daily.    Your prescription(s) have been submitted to your pharmacy. Please take as directed and contact our office if you believe you are having problem(s) with the medication(s).   Please followup in 6 months

## 2019-05-19 NOTE — Assessment & Plan Note (Signed)
She was on Prolia, but she feels it did cause side effects of increased hip pain, back pain and fatigue and she does not want to have any other Prolia injections She is taking her calcium and vitamin D daily.  She is not exercising regularly and is not very active She does use her cane or walker to ambulate to prevent falls

## 2019-05-30 ENCOUNTER — Telehealth: Payer: Self-pay | Admitting: Pulmonary Disease

## 2019-05-30 NOTE — Telephone Encounter (Signed)
Nucala Order: 100mg  #1 Vial Order Date: 05/30/2019 Expected date of arrival: 05/31/2019 Ordered by: Desmond Dike, [place]: Nigel Mormon

## 2019-05-31 NOTE — Telephone Encounter (Signed)
Nucala Shipment Received: 100mg  #1 vial Medication arrival date: 05/31/2019 Lot #: [id number] Exp date: 09/2022 Received by: TBS

## 2019-06-01 ENCOUNTER — Other Ambulatory Visit: Payer: Self-pay | Admitting: Internal Medicine

## 2019-06-09 ENCOUNTER — Ambulatory Visit: Payer: Medicare Other

## 2019-06-13 ENCOUNTER — Ambulatory Visit (INDEPENDENT_AMBULATORY_CARE_PROVIDER_SITE_OTHER): Payer: Medicare Other

## 2019-06-13 ENCOUNTER — Other Ambulatory Visit: Payer: Self-pay

## 2019-06-13 DIAGNOSIS — J4550 Severe persistent asthma, uncomplicated: Secondary | ICD-10-CM

## 2019-06-13 MED ORDER — MEPOLIZUMAB 100 MG ~~LOC~~ SOLR
100.0000 mg | SUBCUTANEOUS | Status: DC
  Administered 2019-06-13: 11:00:00 100 mg via SUBCUTANEOUS

## 2019-06-13 NOTE — Progress Notes (Signed)
All questions were answered by the patient before medication was administered. Have you been hospitalized in the last 10 days? No Do you have a fever? No Do you have a cough? No Do you have a headache or sore throat? No  

## 2019-06-14 ENCOUNTER — Encounter (INDEPENDENT_AMBULATORY_CARE_PROVIDER_SITE_OTHER): Payer: Self-pay | Admitting: Family Medicine

## 2019-06-15 ENCOUNTER — Ambulatory Visit: Payer: Medicare Other | Admitting: Family Medicine

## 2019-06-16 ENCOUNTER — Ambulatory Visit (INDEPENDENT_AMBULATORY_CARE_PROVIDER_SITE_OTHER): Payer: Medicare Other | Admitting: Family Medicine

## 2019-06-16 ENCOUNTER — Other Ambulatory Visit: Payer: Self-pay

## 2019-06-16 ENCOUNTER — Encounter: Payer: Self-pay | Admitting: Family Medicine

## 2019-06-16 DIAGNOSIS — M1711 Unilateral primary osteoarthritis, right knee: Secondary | ICD-10-CM | POA: Diagnosis not present

## 2019-06-16 DIAGNOSIS — M25551 Pain in right hip: Secondary | ICD-10-CM

## 2019-06-16 DIAGNOSIS — M1712 Unilateral primary osteoarthritis, left knee: Secondary | ICD-10-CM

## 2019-06-16 DIAGNOSIS — M1611 Unilateral primary osteoarthritis, right hip: Secondary | ICD-10-CM
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
## 2019-06-20 ENCOUNTER — Encounter: Payer: Self-pay | Admitting: Family Medicine

## 2019-06-20 MED ORDER — HYDROCODONE-ACETAMINOPHEN 5-325 MG PO TABS: 1.0000 | ORAL_TABLET | Freq: Four times a day (QID) | ORAL | 0 refills | Status: DC | PRN

## 2019-06-21 ENCOUNTER — Other Ambulatory Visit: Payer: Self-pay | Admitting: Internal Medicine

## 2019-07-07 ENCOUNTER — Telehealth: Payer: Self-pay | Admitting: Pulmonary Disease

## 2019-07-07 NOTE — Telephone Encounter (Signed)
Nucala Order: 100mg  #1 Vial Order Date: 07/07/2019 Expected date of arrival: 07/08/2019 Ordered by: [place]: Ephriam Knuckles is in triage all day, she asked me to order pt's nucala. (As she wouldn't have time.)

## 2019-07-08 NOTE — Telephone Encounter (Signed)
Nucala Shipment Received: 100mg  #1 vial Medication arrival date: 07/08/2019 Lot #: [id number] Exp date: 09/2022 Received by: Laurette Schimke

## 2019-07-11 ENCOUNTER — Other Ambulatory Visit: Payer: Self-pay

## 2019-07-11 ENCOUNTER — Ambulatory Visit (INDEPENDENT_AMBULATORY_CARE_PROVIDER_SITE_OTHER): Payer: Medicare Other

## 2019-07-11 DIAGNOSIS — J4550 Severe persistent asthma, uncomplicated: Secondary | ICD-10-CM

## 2019-07-11 MED ORDER — MEPOLIZUMAB 100 MG ~~LOC~~ SOLR
100.0000 mg | Freq: Once | SUBCUTANEOUS | Status: AC
  Administered 2019-07-11: 16:00:00 100 mg via SUBCUTANEOUS

## 2019-07-11 NOTE — Progress Notes (Signed)
Have you been hospitalized within the last 10 days?  No Do you have a fever?  No Do you have a cough?  No Do you have a headache or sore throat? No  

## 2019-08-01 ENCOUNTER — Telehealth: Payer: Self-pay | Admitting: Pulmonary Disease

## 2019-08-01 NOTE — Telephone Encounter (Signed)
Nucala Order: 100mg  #1 Vial Order Date: 08/01/19  Expected date of arrival: 08/02/19  Ordered by: Parke Poisson, [place]: Nigel Mormon

## 2019-08-02 NOTE — Telephone Encounter (Signed)
Nucala Shipment Received: 100mg  #1 vial Medication arrival date: 08/02/19 Lot #: [id number] Exp date: 07/2022 Received by: Tonna Corner

## 2019-08-03 ENCOUNTER — Encounter: Payer: Self-pay | Admitting: Allergy and Immunology

## 2019-08-03 ENCOUNTER — Other Ambulatory Visit: Payer: Self-pay

## 2019-08-03 MED ORDER — PREDNISONE 1 MG PO TABS: ORAL_TABLET | ORAL | 1 refills | Status: DC

## 2019-08-05 ENCOUNTER — Telehealth: Payer: Self-pay | Admitting: Pulmonary Disease

## 2019-08-05 MED ORDER — ALBUTEROL SULFATE HFA 108 (90 BASE) MCG/ACT IN AERS: 2.0000 | INHALATION_SPRAY | Freq: Four times a day (QID) | RESPIRATORY_TRACT | 5 refills | Status: DC | PRN

## 2019-08-05 MED ORDER — EPINEPHRINE 0.3 MG/0.3ML IJ SOAJ: 0.3000 mg | Freq: Once | INTRAMUSCULAR | 5 refills | Status: AC

## 2019-08-05 NOTE — Telephone Encounter (Signed)
Called Patient to remind her of injection Monday and to bring her epipen to OV.  Patient stated she has never had a epipen. Explained per policy with injections, each patient is to have epipen at each [place].  Patient requested [place].  Patient wanted to know about Patient assistance for epipen, because she heard they were expensive.  Explained pharmacy did bring patient assistance forms for epipens and if needed we could fax paperwork. Patient requested schedule with [facility].  OV made for 08/26/19 at 1100. Epipen prescription per standing injection policy and proair refill sent to requested pharmacy. Nothing further needed at this time.

## 2019-08-08 ENCOUNTER — Other Ambulatory Visit: Payer: Self-pay

## 2019-08-08 ENCOUNTER — Ambulatory Visit (INDEPENDENT_AMBULATORY_CARE_PROVIDER_SITE_OTHER): Payer: Medicare Other

## 2019-08-08 DIAGNOSIS — J4550 Severe persistent asthma, uncomplicated: Secondary | ICD-10-CM | POA: Diagnosis not present

## 2019-08-08 MED ORDER — MEPOLIZUMAB 100 MG ~~LOC~~ SOLR
100.0000 mg | SUBCUTANEOUS | Status: DC
  Administered 2019-08-08: 100 mg via SUBCUTANEOUS

## 2019-08-08 NOTE — Progress Notes (Signed)
Have you been hospitalized within the last 10 days?  No Do you have a fever?  No Do you have a cough?  No Do you have a headache or sore throat? No Do you have your Epi Pen visible and is it within date?  No   Epipen ordered to Patients pharmacy.

## 2019-08-08 NOTE — Telephone Encounter (Signed)
Patient came to injection appointment this morning without her epipen.  Patient stated epipen prescription cost was $200 for 1.  Patient stated she was unable to afford epipen.  Patient stated her Daughter is a pharmacy rep, who told her to have [place] look at [facility] free coupon for 2, for free epipens.  Patient also signed patient assistance form from [facility] for epipen assistance.  Will complete patient assistance form.  Will also have pharmacy to look into this for her later this week.

## 2019-08-09 ENCOUNTER — Telehealth: Payer: Self-pay | Admitting: Pulmonary Disease

## 2019-08-09 ENCOUNTER — Encounter: Payer: Self-pay | Admitting: Allergy and Immunology

## 2019-08-09 ENCOUNTER — Ambulatory Visit (INDEPENDENT_AMBULATORY_CARE_PROVIDER_SITE_OTHER): Payer: Medicare Other | Admitting: Allergy and Immunology

## 2019-08-09 VITALS — BP 128/70 | HR 73 | Temp 97.7°F | Resp 16

## 2019-08-09 DIAGNOSIS — K219 Gastro-esophageal reflux disease without esophagitis: Secondary | ICD-10-CM | POA: Diagnosis not present

## 2019-08-09 DIAGNOSIS — Z7952 Long term (current) use of systemic steroids: Secondary | ICD-10-CM | POA: Diagnosis not present

## 2019-08-09 DIAGNOSIS — J4550 Severe persistent asthma, uncomplicated: Secondary | ICD-10-CM

## 2019-08-09 DIAGNOSIS — J3089 Other allergic rhinitis: Secondary | ICD-10-CM | POA: Diagnosis not present

## 2019-08-09 NOTE — Patient Instructions (Addendum)
  1.  Continue to treat inflammation:   A.  Mepolizumab injection every 28 days  B.  Prednisone 4 mg daily  C.  Asmanex 220 - 2 inhalations 2 times per day    2.  Continue to Treat reflux:   A.  Consolidate caffeinated drink consumption as much as possible  B.  Consolidate chocolate consumption as much as possible  C.  Famotidine 40 mg -1 tablet 1 time per day    3.  If needed:   A.  Zyrtec 10 mg tablet 1 time per day  B.  Nasal saline 1 time per day  C.  Pro-air HFA 2 inhalations every 4-6 hours  5.  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  Follow-up with Dr. Halford Chessman regarding asthma care  7.  Obtain fall flu vaccine (and COVID vaccine)
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
## 2019-08-09 NOTE — Telephone Encounter (Signed)
Epipen was sent to [facility] for patient. Patient stated she was never prescribed epipen when she started biologics.  Patient stated epipen cost was $200 and she could not afford it.  Patient completed and signed [employer] Patient assistance form. Will go over with pharmacy tomorrow and follow up.

## 2019-08-10 ENCOUNTER — Encounter: Payer: Self-pay | Admitting: Allergy and Immunology

## 2019-08-10 NOTE — Telephone Encounter (Signed)
Faxed application for Epipen patient assistance program (household 1, $1600/month) 08/10/2019, need income statements from patient---patient notified and will need to fax income statements when patient brings to clinic.

## 2019-08-12 NOTE — Telephone Encounter (Signed)
Patient has OV 08/26/19 with Dr Halford Chessman for follow up OV.

## 2019-08-15 ENCOUNTER — Encounter: Payer: Self-pay | Admitting: Allergy and Immunology

## 2019-08-16 ENCOUNTER — Telehealth: Payer: Self-pay | Admitting: *Deleted

## 2019-08-16 DIAGNOSIS — Z7952 Long term (current) use of systemic steroids: Secondary | ICD-10-CM

## 2019-08-16 NOTE — Telephone Encounter (Signed)
-----   Message from [place] A Neal-Mims, NT sent at 08/11/2019  5:13 PM EDT ----- Regarding: FW: Endo Referral  ----- Message ----- From: Donzetta Starch, CMA Sent: 08/09/2019  10:49 AM EDT To: [facility] Admin Pool Subject: Endo Referral                                  Dr. Neldon Mc would like this patient to see endocrinology for a possible adrenal insufficiency. Thank you!

## 2019-08-16 NOTE — Telephone Encounter (Signed)
Spoke with [facility] and they said just place referral via epic.

## 2019-08-17 NOTE — Telephone Encounter (Signed)
Referral placed.

## 2019-08-17 NOTE — Addendum Note (Signed)
Addended by: Horris Latino on: 08/17/2019 02:03 PM   Modules accepted: Orders

## 2019-08-18 NOTE — Telephone Encounter (Signed)
Noted. Thanks.

## 2019-08-26 ENCOUNTER — Other Ambulatory Visit: Payer: Self-pay

## 2019-08-26 ENCOUNTER — Ambulatory Visit (INDEPENDENT_AMBULATORY_CARE_PROVIDER_SITE_OTHER): Payer: Medicare Other | Admitting: Pulmonary Disease

## 2019-08-26 ENCOUNTER — Telehealth: Payer: Self-pay | Admitting: Pulmonary Disease

## 2019-08-26 ENCOUNTER — Encounter: Payer: Self-pay | Admitting: Pulmonary Disease

## 2019-08-26 VITALS — BP 105/60 | HR 66 | Temp 96.3°F | Ht 60.0 in | Wt 150.8 lb

## 2019-08-26 DIAGNOSIS — J82 Pulmonary eosinophilia, not elsewhere classified: Secondary | ICD-10-CM | POA: Diagnosis not present

## 2019-08-26 DIAGNOSIS — J8289 Other pulmonary eosinophilia, not elsewhere classified: Secondary | ICD-10-CM

## 2019-08-26 DIAGNOSIS — Z23 Encounter for immunization: Secondary | ICD-10-CM | POA: Diagnosis not present

## 2019-08-26 DIAGNOSIS — J4550 Severe persistent asthma, uncomplicated: Secondary | ICD-10-CM | POA: Diagnosis not present

## 2019-08-26 LAB — CBC WITH DIFFERENTIAL/PLATELET
Basophils Absolute: 0 10*3/uL (ref 0.0–0.1)
Basophils Relative: 0.3 % (ref 0.0–3.0)
Eosinophils Absolute: 0.1 10*3/uL (ref 0.0–0.7)
Eosinophils Relative: 1.3 % (ref 0.0–5.0)
HCT: 36.1 % (ref 36.0–46.0)
Hemoglobin: 11.9 g/dL — ABNORMAL LOW (ref 12.0–15.0)
Lymphocytes Relative: 12.1 % (ref 12.0–46.0)
Lymphs Abs: 1 10*3/uL (ref 0.7–4.0)
MCHC: 33.1 g/dL (ref 30.0–36.0)
MCV: 94.5 fl (ref 78.0–100.0)
Monocytes Absolute: 0.9 10*3/uL (ref 0.1–1.0)
Monocytes Relative: 10.9 % (ref 3.0–12.0)
Neutro Abs: 6 10*3/uL (ref 1.4–7.7)
Neutrophils Relative %: 75.4 % (ref 43.0–77.0)
Platelets: 179 10*3/uL (ref 150.0–400.0)
RBC: 3.82 Mil/uL — ABNORMAL LOW (ref 3.87–5.11)
RDW: 15.1 % (ref 11.5–15.5)
WBC: 8 10*3/uL (ref 4.0–10.5)

## 2019-08-26 NOTE — Addendum Note (Signed)
Addended by: Suzzanne Cloud E on: 08/26/2019 12:10 PM   Modules accepted: Orders

## 2019-08-26 NOTE — Patient Instructions (Signed)
Lab test today  High dose flu shot today  Follow up in 6 months

## 2019-08-26 NOTE — Telephone Encounter (Signed)
LMTCB

## 2019-08-26 NOTE — Progress Notes (Signed)
[facility]  Chief Complaint  Patient presents with  . Follow-up    Currently receving Nucala injections. She reports no concerns with her breathing.     Constitutional:  BP 105/60   Pulse 66   Temp (!) 96.3 F (35.7 C) (Temporal)   Ht 5' (1.524 m)   Wt 150 lb 12.8 oz (68.4 kg)   SpO2 98%   BMI 29.45 kg/m   Past Medical History:  Anemia, Anxiety, Arthritis, C2 fx, Cataract, Chronic headaches, Depression, GERD, Gout, Hiatal hernia, HTN, Osteoporosis, Urinary incontinence  Brief Summary:  Lisa Larson is a 78 y.o. 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  Was seen by Dr. Neldon Mc recently.  Had spirometry.  Showed obstruction.  She remains on 4 mg prednisone.  Feels miserable if dose is reduced lower than this.  Reports being set up for endocrinology referral.  Breathing is better.  Feels nucala has helped a lot.  Not having chest pain, fever, hemoptysis, cough, sputum, skin rash.   Physical Exam:   Appearance - well kempt   ENMT - no sinus tenderness, no nasal discharge, no oral exudate  Neck - no masses, trachea midline, no thyromegaly, no elevation in JVP  Respiratory - normal appearance of chest wall, normal respiratory effort w/o accessory muscle use, no dullness on percussion, no wheezing or rales  CV - s1s2 regular rate and rhythm, no murmurs, no peripheral edema, radial pulses symmetric  GI - soft, non tender  Lymph - no adenopathy noted in neck and axillary areas  MSK - sitting in wheelchair  Ext - no cyanosis, clubbing, or joint inflammation noted  Skin - no rashes, lesions, or ulcers  Neuro - normal strength, oriented x 3  Psych - normal mood and affect   Assessment/Plan:   Eosinophilic pneumonia. - continue 4 mg prednisone daily for now - will await input from endocrinology about recommendations on how to taper steroid dose further - repeat CBC  with diff today - high dose flu shot today  Allergic asthma with eosinophilic phenotype. - continue nucala, asmanex - prn albuterol  Allergic rhinitis. - prn flonase  Osteoarthritis. - f/u with PCP and rheumatology (Dr. Dossie Der)  Advice regarding COVID 19. - discuss symptoms to monitor for, and techniques to limit exposure risk   Patient Instructions  Lab test today  High dose flu shot today  Follow up in 6 months    [name], MD [place] Pager: [phone] 08/26/2019, 12:04 PM  Flow Sheet     Pulmonary tests:  CBC 01/14/11>>39% eosinophils 01/14/11>>HIV negative, ACE 39, RF negative, ANA 1:40, ANCA negative  CT chest 01/16/11>>Multifocal b/l ASD with GGO BAL 01/21/11>>48% Eosinophils  IgE 02/25/11>>282 CT chest 07/22/11>>resolution of ASD CT chest 10/03/11>>recurrence of nodular infiltrate Rt upper lobe PFT 11/04/12>>FEV1 1.77 (87%), FEV1% 68, TLC 4.87 (101%), DLCO 69%, +BD from FEF 25-75%. RAST 10/26/17 >> negative, IgE 108 Spirometry 08/09/19 >> FEV1 1.08 (64%), FEV1% 60  Events:  March 2019 >> start nucala  Medications:   Allergies as of 08/26/2019      Reactions   Other Rash, Shortness Of Breath   Sulfonamide Derivatives Shortness Of Breath, Rash   Oxycodone-aspirin Other (See Comments)   Couldn't hear    Diclofenac Other (See Comments)   Unknown reaction    Prolia [denosumab]    Muscle pain, bone pain   Rofecoxib Other (See Comments)   Unknown reaction    Ace  Inhibitors Cough   Benazepril Hcl Other (See Comments)   No PMH of angioedema; ACE-I caused cough   Tramadol Itching      Medication List       Accurate as of August 26, 2019 12:04 PM. If you have any questions, ask your nurse or doctor.        acetaminophen 500 MG tablet Commonly known as: TYLENOL Take 1,000 mg by mouth 3 (three) times daily as needed.   albuterol 108 (90 Base) MCG/ACT inhaler Commonly known as: ProAir HFA Inhale 2 puffs into the lungs  every 6 (six) hours as needed for wheezing or shortness of breath.   calcium carbonate 600 MG tablet Commonly known as: OS-CAL Take 600 mg by mouth 2 (two) times daily.   Cetirizine HCl 10 MG Caps Take 1 capsule (10 mg total) by mouth daily.   famotidine 40 MG tablet Commonly known as: PEPCID Take 1 tablet (40 mg total) by mouth daily.   fluticasone 50 MCG/ACT nasal spray Commonly known as: FLONASE Place 2 sprays into both nostrils daily.   gabapentin 300 MG capsule Commonly known as: NEURONTIN Take 1 capsule (300 mg total) by mouth 3 (three) times daily.   HYDROcodone-acetaminophen 5-325 MG tablet Commonly known as: NORCO/VICODIN Take 1 tablet by mouth every 6 (six) hours as needed for moderate pain.   hydrOXYzine 25 MG tablet Commonly known as: ATARAX/VISTARIL Take 1 tablet (25 mg total) by mouth at bedtime as needed (sleep).   LORazepam 1 MG tablet Commonly known as: ATIVAN TAKE 1/2 TABLET BY MOUTH EVERY 8- 12 HOURS AS NEEDED   losartan 100 MG tablet Commonly known as: COZAAR TAKE 1 TABLET(100 MG) BY MOUTH DAILY   meloxicam 7.5 MG tablet Commonly known as: MOBIC TAKE 1 TABLET(7.5 MG) BY MOUTH DAILY   mometasone 220 MCG/INH inhaler Commonly known as: Asmanex (60 Metered Doses) Inhale 2 puffs into the lungs daily.   nystatin-triamcinolone ointment Commonly known as: MYCOLOG Apply 1 application topically 2 (two) times daily.   PHILLIPS COLON HEALTH PO Take 1 capsule by mouth daily as needed (For overall digestive health.).   predniSONE 1 MG tablet Commonly known as: DELTASONE Take 4mg  by mouth daily in the morning   sertraline 50 MG tablet Commonly known as: ZOLOFT Take 3 tablets (150 mg total) by mouth daily.   thiamine 100 MG tablet Commonly known as: VITAMIN B-1 Take 100 mg by mouth daily as needed.   Vitamin D3 50 MCG (2000 UT) capsule Take 2,000 Units by mouth daily.       Past Surgical History:  She  has a past surgical history that  includes Shoulder surgery (Left, 08-2008); Total abdominal hysterectomy; Tonsillectomy and adenoidectomy; colonoscopy with polypectomy (06/2013); Nasal sinus surgery; Appendectomy; Esophageal dilation; Bronchoscopy (12-2010); Cataract extraction w/ intraocular lens  implant, bilateral (Bilateral); Knee arthroscopy (Right, 06/18/2015); Tonsillectomy; Cholecystectomy (N/A, 05/23/2016); Upper gastrointestinal endoscopy; Colonoscopy; Polypectomy; and Adenoidectomy.  Family History:  Her family history includes Alzheimer's disease in her mother; Arthritis in her father; Cancer in her son; Colitis in her mother; Diabetes in her brother and paternal grandmother; Heart attack in her paternal grandmother; Hypertension in her brother, father, and mother; Irritable bowel syndrome in her mother; Non-Hodgkin's lymphoma in her brother; Other in her son; Pulmonary embolism in her father; Rheum arthritis in her father.  Social History:  She  reports that she quit smoking about 50 years ago. Her smoking use included cigarettes. She has a 15.00 pack-year smoking history. She has never used  smokeless tobacco. She reports that she does not drink alcohol or use drugs.

## 2019-08-29 ENCOUNTER — Telehealth: Payer: Self-pay | Admitting: Pulmonary Disease

## 2019-08-29 NOTE — Telephone Encounter (Signed)
Nucala Order: 100mg  #1 Vial Order Date: 08/29/19 Expected date of arrival: 08/30/2019 Ordered by: Leander Rams, LPN [facility]ty [facility]: [facility]

## 2019-08-29 NOTE — Telephone Encounter (Signed)
Returned call to patient regarding ProAir.  Patient states she had requested refill but forgot to get it before leaving after her recent OV.  Informed refill was sent to [facility].  Patient states that was fine she just wasn't sure.  Nothing further needed at this time.

## 2019-08-30 ENCOUNTER — Telehealth: Payer: Self-pay | Admitting: Pulmonary Disease

## 2019-08-30 NOTE — Telephone Encounter (Signed)
Contacted patient and and made aware eosinophil blood level wnl limits No change in therapy at this time. Nothing further needed.

## 2019-08-30 NOTE — Telephone Encounter (Signed)
Nucala Shipment Received: 100mg  #1 vial Medication arrival date: 08/30/19 Lot #: [id number] Exp date: 10/29/2022 Received by: Tonna Corner

## 2019-08-30 NOTE — Telephone Encounter (Signed)
CBC    Component Value Date/Time   WBC 8.0 08/26/2019 1211   RBC 3.82 (L) 08/26/2019 1211   HGB 11.9 (L) 08/26/2019 1211   HCT 36.1 08/26/2019 1211   PLT 179.0 08/26/2019 1211   MCV 94.5 08/26/2019 1211   MCH 33.4 05/23/2016 1015   MCHC 33.1 08/26/2019 1211   RDW 15.1 08/26/2019 1211   LYMPHSABS 1.0 08/26/2019 1211   MONOABS 0.9 08/26/2019 1211   EOSABS 0.1 08/26/2019 1211   BASOSABS 0.0 08/26/2019 1211    Please let her know her lab test was okay.  Her eosinophil blood level is staying in normal range.  No change to treatment plan.

## 2019-09-02 ENCOUNTER — Encounter: Payer: Self-pay | Admitting: Internal Medicine

## 2019-09-06 ENCOUNTER — Other Ambulatory Visit: Payer: Self-pay | Admitting: *Deleted

## 2019-09-06 ENCOUNTER — Other Ambulatory Visit: Payer: Self-pay

## 2019-09-06 ENCOUNTER — Ambulatory Visit (INDEPENDENT_AMBULATORY_CARE_PROVIDER_SITE_OTHER): Payer: Medicare Other

## 2019-09-06 DIAGNOSIS — J4550 Severe persistent asthma, uncomplicated: Secondary | ICD-10-CM

## 2019-09-06 MED ORDER — MEPOLIZUMAB 100 MG ~~LOC~~ SOLR
100.0000 mg | SUBCUTANEOUS | Status: DC
  Administered 2019-09-06: 100 mg via SUBCUTANEOUS

## 2019-09-06 MED ORDER — EPINEPHRINE 0.3 MG/0.3ML IJ SOAJ: 0.3000 mg | Freq: Once | INTRAMUSCULAR | 5 refills | Status: AC

## 2019-09-06 NOTE — Progress Notes (Signed)
All questions were answered by the patient before medication was administered. Have you been hospitalized in the last 10 days? No Do you have a fever? No Do you have a cough? No Do you have a headache or sore throat? No  

## 2019-09-07 ENCOUNTER — Ambulatory Visit: Payer: Medicare Other | Admitting: Pulmonary Disease

## 2019-09-07 NOTE — Telephone Encounter (Signed)
Patient has been scheduled for 9/10 Thanks

## 2019-09-08 ENCOUNTER — Encounter: Payer: Self-pay | Admitting: Internal Medicine

## 2019-09-08 ENCOUNTER — Ambulatory Visit (INDEPENDENT_AMBULATORY_CARE_PROVIDER_SITE_OTHER): Payer: Medicare Other | Admitting: Internal Medicine

## 2019-09-08 ENCOUNTER — Other Ambulatory Visit: Payer: Self-pay

## 2019-09-08 VITALS — BP 122/64 | HR 60 | Temp 98.3°F | Ht 60.0 in | Wt 148.6 lb

## 2019-09-08 DIAGNOSIS — E2749 Other adrenocortical insufficiency: Secondary | ICD-10-CM | POA: Diagnosis not present

## 2019-09-08 DIAGNOSIS — M549 Dorsalgia, unspecified: Secondary | ICD-10-CM

## 2019-09-08 DIAGNOSIS — M1990 Unspecified osteoarthritis, unspecified site: Secondary | ICD-10-CM

## 2019-09-08 MED ORDER — HYDROCORTISONE 5 MG PO TABS: ORAL_TABLET | ORAL | 1 refills | Status: DC

## 2019-09-08 NOTE — Patient Instructions (Signed)
-  Stop Prednisone   - Start Hydrocortisone 5 mg, ( 2 tablets with Breakfast ) and 1 tablet with lunch    ADRENAL INSUFFICIENCY SICK DAY RULES:  Should you face an extreme emotional or physical stress such as trauma, surgery or acute illness, this will require extra steroid coverage so that the body can meet that stress.   Without increasing the steroid dose you may experience severe weakness, headache, dizziness, nausea and vomiting and possibly a more serious deterioration in health.  Typically the dose of steroids will only need to be increased for a couple of days if you have an illness that is transient and managed in the community.   If you are unable to take/absorb an increased dose of steroids orally because of vomiting or diarrhea, you will urgently require steroid injections and should present to an Emergency Department.  The general advice for any serious illness is as follows: 1. Double the normal daily steroid dose for up to 3 days if you have a temperature of more than 37.50C (99.33F) with signs of sickness, or severe emotional or physical distress 2. Contact your primary care doctor and Endocrinologist if the illness worsens or it lasts for more than 3 days.  3. In cases of severe illness, urgent medical assistance should be promptly sought. 4. If you experience vomiting/diarrhea or are unable to take steroids by mouth, please administer the Hydrocortisone injection kit and seek urgent medical help.
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
## 2019-09-23 ENCOUNTER — Other Ambulatory Visit: Payer: Self-pay

## 2019-09-23 ENCOUNTER — Encounter: Payer: Self-pay | Admitting: Internal Medicine

## 2019-09-23 ENCOUNTER — Ambulatory Visit (INDEPENDENT_AMBULATORY_CARE_PROVIDER_SITE_OTHER): Payer: Medicare Other | Admitting: Internal Medicine

## 2019-09-23 ENCOUNTER — Telehealth: Payer: Self-pay | Admitting: Internal Medicine

## 2019-09-23 DIAGNOSIS — N3000 Acute cystitis without hematuria: Secondary | ICD-10-CM | POA: Diagnosis not present

## 2019-09-23 MED ORDER — FLUCONAZOLE 150 MG PO TABS: 150.0000 mg | ORAL_TABLET | Freq: Once | ORAL | 0 refills | Status: AC

## 2019-09-23 MED ORDER — AMOXICILLIN-POT CLAVULANATE 875-125 MG PO TABS: 1.0000 | ORAL_TABLET | Freq: Two times a day (BID) | ORAL | 0 refills | Status: DC

## 2019-09-23 NOTE — Telephone Encounter (Signed)
Patient will call back to schedule ?

## 2019-09-23 NOTE — Telephone Encounter (Signed)
Pt called and stated that she thinks the she has a UTI. Burning when urinating and frequent urination. Blood in urine. Pt would like a call back from the nurse regarding. Please advise

## 2019-09-23 NOTE — Assessment & Plan Note (Signed)
Symptoms consistent with UTI Unfortunately this is a virtual visit we are not able to check her urine, but given her symptoms we will empirically treat Augmentin twice daily x1 week Diflucan 150 mg x 1 after completing antibiotics for yeast infection, which she typically gets after taking an antibiotic Continue increase fluids Tylenol as needed Advised that if her symptoms worsen over the weekend and she develops any fever, abdominal pain and she needs to call us immediately If her symptoms do not completely resolve with the antibiotic advised her to call-we will need to do a urinalysis at that time

## 2019-09-23 NOTE — Telephone Encounter (Signed)
Please have pt make an OV. She can come in today.

## 2019-09-23 NOTE — Progress Notes (Signed)
Virtual Visit via Video Note  I connected with Lisa Larson on 09/23/19 at  1:00 PM EDT by a video enabled telemedicine application and verified that I am speaking with the correct person using two identifiers.   I discussed the limitations of evaluation and management by telemedicine and the availability of in person appointments. The patient expressed understanding and agreed to proceed.  The patient is currently at home and I am in the office.  Her daughter is with her.   No referring provider.    History of Present Illness: This is an acute visit for possible UTI.   ? UTI:  Her symptoms started  4 days ago.  She states dysuria, urinary frequency, urinary urgency, hematuria, abdominal pain, back pain, nausea, fever.  She denies abdominal pain, back pain, nausea, fever.   She has had UTI's in the past.  She does often get a yeast infection with taking antibiotics.   Social History   Socioeconomic History  . Marital status: Widowed    Spouse name: Not on file  . Number of children: 2  . Years of education: Not on file  . Highest education level: Not on file  Occupational History  . Occupation: Retired, Production designer, theatre/television/film work  Social Needs  . Financial resource strain: Not hard at all  . Food insecurity    Worry: Never true    Inability: Never true  . Transportation needs    Medical: No    Non-medical: No  Tobacco Use  . Smoking status: Former Smoker    Packs/day: 1.00    Years: 15.00    Pack years: 15.00    Types: Cigarettes    Quit date: 12/29/1968    Years since quitting: 50.7  . Smokeless tobacco: Never Used  . Tobacco comment: smoked 1966- ? 1970, up to 1 ppd  Substance and Sexual Activity  . Alcohol use: No    Comment: h/o of alcohol abuse  . Drug use: No  . Sexual activity: Yes    Birth control/protection: Surgical  Lifestyle  . Physical activity    Days per week: 0 days    Minutes per session: 0 min  . Stress: Not at all  Relationships  . Social  connections    Talks on phone: More than three times a week    Gets together: More than three times a week    Attends religious service: Never    Active member of club or organization: Yes    Attends meetings of clubs or organizations: Never    Relationship status: Widowed  Other Topics Concern  . Not on file  Social History Narrative   Lives alone.  Has a son and a daughter who help with her care.  Ambulates with a cane.     Observations/Objective: Appears well in NAD   Assessment and Plan:  See Problem List for Assessment and Plan of chronic medical problems.   Follow Up Instructions:    I discussed the assessment and treatment plan with the patient. The patient was provided an opportunity to ask questions and all were answered. The patient agreed with the plan and demonstrated an understanding of the instructions.   The patient was advised to call back or seek an in-person evaluation if the symptoms worsen or if the condition fails to improve as anticipated.    Binnie Rail, MD

## 2019-09-26 ENCOUNTER — Telehealth: Payer: Self-pay | Admitting: Pulmonary Disease

## 2019-09-26 NOTE — Telephone Encounter (Signed)
Nucala Order: 100mg  #1 Vial Order Date: 09/26/2019 Expected date of arrival: 09/27/2019 Ordered by: Desmond Dike, [place]  [facility]: [facility]

## 2019-09-27 NOTE — Telephone Encounter (Signed)
Nucala Shipment Received: 100mg  #1 vial Medication arrival date: 09/27/2019 Lot #: [id number] Exp date: 10/2022 Received by: Desmond Dike, [place]

## 2019-10-03 ENCOUNTER — Encounter: Payer: Self-pay | Admitting: Physical Medicine & Rehabilitation

## 2019-10-03 ENCOUNTER — Encounter: Payer: Medicare Other | Attending: Physical Medicine & Rehabilitation | Admitting: Physical Medicine & Rehabilitation

## 2019-10-03 ENCOUNTER — Telehealth: Payer: Self-pay | Admitting: Pulmonary Disease

## 2019-10-03 ENCOUNTER — Other Ambulatory Visit: Payer: Self-pay

## 2019-10-03 VITALS — BP 116/75 | HR 67 | Temp 97.2°F | Ht 60.0 in | Wt 149.0 lb

## 2019-10-03 DIAGNOSIS — M170 Bilateral primary osteoarthritis of knee: Secondary | ICD-10-CM

## 2019-10-03 DIAGNOSIS — M1611 Unilateral primary osteoarthritis, right hip: Secondary | ICD-10-CM | POA: Diagnosis not present

## 2019-10-03 NOTE — Patient Instructions (Signed)
Voltaren gel to both knees 4 times a day (OTC)  Next visit is for cortisone injection Right hip
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
## 2019-10-03 NOTE — Telephone Encounter (Signed)
LMTCB

## 2019-10-04 ENCOUNTER — Ambulatory Visit (INDEPENDENT_AMBULATORY_CARE_PROVIDER_SITE_OTHER): Payer: Medicare Other

## 2019-10-04 DIAGNOSIS — J4550 Severe persistent asthma, uncomplicated: Secondary | ICD-10-CM

## 2019-10-04 MED ORDER — MEPOLIZUMAB 100 MG ~~LOC~~ SOLR: 100.0000 mg | SUBCUTANEOUS | Status: DC

## 2019-10-04 MED ORDER — MEPOLIZUMAB 100 MG ~~LOC~~ SOLR
100.0000 mg | Freq: Once | SUBCUTANEOUS | Status: AC
  Administered 2019-10-04: 12:00:00 100 mg via SUBCUTANEOUS

## 2019-10-04 NOTE — Telephone Encounter (Signed)
Patient came into office for Nucala injection.  Patient is wanting to know if she can discontinue Nucala. Patient stated her labs were improved since starting Nucala and her insurance cost was increasing. Patient did received Nucala injection today.   Message routed to Dr. Halford Chessman to advise on continuing Nucala

## 2019-10-04 NOTE — Telephone Encounter (Signed)
ATC patient unable to reach LM to call back office (x2) Next time if patient doesn't answer send mychart message or letter and close message

## 2019-10-04 NOTE — Progress Notes (Signed)
Have you been hospitalized within the last 10 days?  No Do you have a fever?  No Do you have a cough?  No Do you have a headache or sore throat? No Do you have your Epi Pen visible and is it within date?  Yes 

## 2019-10-07 NOTE — Telephone Encounter (Signed)
Pt returned call

## 2019-10-10 NOTE — Telephone Encounter (Signed)
Spoke with the pt  I advised we are still waiting on response from Dr Halford Chessman  She verbalized understanding  Please advise on Nucala thanks

## 2019-10-10 NOTE — Telephone Encounter (Signed)
Dr. Halford Chessman will return to office on 10/13/2019.

## 2019-10-10 NOTE — Telephone Encounter (Signed)
Called the patient back to advise of response received from Dr. Halford Chessman. The patient said her cost for the medication has gone from $99 a month to $222 and she is not in the donut hole. Patient states she is looking into getting another insurance because she cannot afford that increased cost and on limited income (Fish farm manager).  The patient stated she will talk with Dr. Halford Chessman about this during her next visit on 10/20/19. I told her I will see what I can find out regarding assistance and get back to her.

## 2019-10-10 NOTE — Telephone Encounter (Signed)
Please explain to her that the reason why her lab tests are better is because she is being treated with nucala.  My recommendation is that she continue using nucala.  Please see if there are options for financial assistance with nucala.  If she has additional questions, then please schedule a tele visit with me.

## 2019-10-11 NOTE — Telephone Encounter (Signed)
Will route to injection pool to see if they are familiar with patient assistance with Nucala and can help out.

## 2019-10-12 ENCOUNTER — Telehealth: Payer: Self-pay | Admitting: Internal Medicine

## 2019-10-12 DIAGNOSIS — R350 Frequency of micturition: Secondary | ICD-10-CM

## 2019-10-12 NOTE — Telephone Encounter (Signed)
Ok to put orders in for urine?

## 2019-10-12 NOTE — Telephone Encounter (Signed)
Yes ok 

## 2019-10-12 NOTE — Telephone Encounter (Signed)
Urine put in. Pt aware.

## 2019-10-12 NOTE — Telephone Encounter (Signed)
Patient's daughter calling stating that patient is still having symptoms of UTI, including increased frequency of urination with very small amount of output. She is requesting a call back to discuss if they are able to bring specimen into office without OV.

## 2019-10-12 NOTE — Telephone Encounter (Signed)
Called and spoke with patient about assistance. [employer] [place] number given.  Patient stated she would contact them to see what options she may have. Explained a new Nucala application may be needed, for  Patient assistance.  Patient stated she would contact office with any info she received. Will follow up.

## 2019-10-14 ENCOUNTER — Other Ambulatory Visit (INDEPENDENT_AMBULATORY_CARE_PROVIDER_SITE_OTHER): Payer: Medicare Other

## 2019-10-14 DIAGNOSIS — R350 Frequency of micturition: Secondary | ICD-10-CM | POA: Diagnosis not present

## 2019-10-14 LAB — URINALYSIS, ROUTINE W REFLEX MICROSCOPIC
Hgb urine dipstick: NEGATIVE
Nitrite: NEGATIVE
RBC / HPF: NONE SEEN (ref 0–?)
Specific Gravity, Urine: 1.025 (ref 1.000–1.030)
Total Protein, Urine: NEGATIVE
Urine Glucose: NEGATIVE
Urobilinogen, UA: 0.2 (ref 0.0–1.0)
pH: 5.5 (ref 5.0–8.0)

## 2019-10-15 LAB — URINE CULTURE
MICRO NUMBER:: 998312
Result:: NO GROWTH
SPECIMEN QUALITY:: ADEQUATE

## 2019-10-19 ENCOUNTER — Other Ambulatory Visit: Payer: Self-pay | Admitting: Internal Medicine

## 2019-10-19 NOTE — Telephone Encounter (Signed)
Pt has a pending OV with Dr. Halford Chessman on 10/20/2019. A new enrollment form will be needed to apply for patient assitance through [facility] [place]. Forms have been placed in Dr. Juanetta Gosling cubby so that the pt can sign them when she comes in for appointment.

## 2019-10-20 ENCOUNTER — Other Ambulatory Visit: Payer: Self-pay

## 2019-10-20 ENCOUNTER — Encounter: Payer: Self-pay | Admitting: Pulmonary Disease

## 2019-10-20 ENCOUNTER — Ambulatory Visit (INDEPENDENT_AMBULATORY_CARE_PROVIDER_SITE_OTHER): Payer: Medicare Other | Admitting: Pulmonary Disease

## 2019-10-20 VITALS — BP 120/80 | HR 64 | Temp 97.7°F | Ht 60.0 in | Wt 151.4 lb

## 2019-10-20 DIAGNOSIS — J8289 Other pulmonary eosinophilia, not elsewhere classified: Secondary | ICD-10-CM | POA: Diagnosis not present

## 2019-10-20 DIAGNOSIS — J4550 Severe persistent asthma, uncomplicated: Secondary | ICD-10-CM | POA: Diagnosis not present

## 2019-10-20 NOTE — Telephone Encounter (Signed)
Patient and Dr. Halford Chessman both have signed their portion. Patient wants to give herself injections at home.  I let patient know I would talk to the injection team and see what options were available. Patient's daughter gives herself injections at home.   Papers handed to [place], [place] working with Dr. Halford Chessman

## 2019-10-20 NOTE — Patient Instructions (Signed)
Follow up in 6 months 

## 2019-10-20 NOTE — Progress Notes (Signed)
[name]  Chief Complaint  Patient presents with  . Follow-up    f/u for asthma; pt reports occasional wheeze, denies other breathing symptoms; taking Nucala injections    Constitutional:  BP 120/80 (BP Location: Right Arm, Cuff Size: Normal)   Pulse 64   Temp 97.7 F (36.5 C) (Temporal)   Ht 5' (1.524 m)   Wt 151 lb 6.4 oz (68.7 kg)   SpO2 96%   BMI 29.57 kg/m   Past Medical History:  Anemia, Anxiety, Arthritis, C2 fx, Cataract, Chronic headaches, Depression, GERD, Gout, Hiatal hernia, HTN, Osteoporosis, Urinary incontinence  Brief Summary:  Lisa Larson is a 78 y.o. 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  She gets intermittent episodes of wheezing.  Hasn't been using asmanex on regular basis.  Hasn't been using albuterol or flonase much either.  Not having sputum, chest pain, hemoptysis, or fever.  Feels nucala has made a difference with her breathing.  Saw endocrinology recently and changed from prednisone to hydrocortisone.  Still gets fatigued easily.  CBC from 08/26/19 showed eosinophil count of 0.1 K/ul.  She had a cut on the inside of her right ankle a few weeks ago.  Has been putting antibiotic ointment on it.  Not having pain, swelling, redness, warmth, or drainage.  Physical Exam:   Appearance - well kempt   ENMT - no sinus tenderness, no nasal discharge, no oral exudate  Neck - no masses, trachea midline, no thyromegaly, no elevation in JVP  Respiratory - normal appearance of chest wall, normal respiratory effort w/o accessory muscle use, no dullness on percussion, no wheezing or rales  CV - s1s2 regular rate and rhythm, no murmurs, no peripheral edema, radial pulses symmetric  GI - soft, non tender  Lymph - no adenopathy noted in neck and axillary areas  MSK - normal gait  Ext - no cyanosis, clubbing, or joint inflammation noted  Skin - scab on  medial aspect of right ankle w/o fluctuance/drainage/tenderness/erythema  Neuro - normal strength, oriented x 3  Psych - normal mood and affect   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 - has been on nucala since March 2019 - will complete forms for financial assistance when available - she would like to see if she can do injections at home - advised her to try using albuterol more when she is having episodes of wheezing - advised she would need to resume regular use of asmanex if she is getting more frequent episodes of wheezing  Allergic rhinitis. - prn flonase  Osteoarthritis. - f/u with PCP and rheumatology (Dr. 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 - followed by Dr. Kelton Pillar with endocrinology - remains on hydrocortisone   Patient Instructions  Follow up in 6 months   Lisa Mires, MD [place] Pager: [phone] 10/20/2019, 12:47 PM  Flow Sheet     Pulmonary tests:  CBC 01/14/11>>39% eosinophils 01/14/11>>HIV negative, ACE 39, RF negative, ANA 1:40, ANCA negative  BAL 01/21/11>>48% Eosinophils  IgE 02/25/11>>282 PFT 11/04/12>>FEV1 1.77 (87%), FEV1% 68, TLC 4.87 (101%), DLCO 69%, +BD from FEF 25-75%. RAST 10/26/17 >> negative, IgE 108 Spirometry 08/09/19 >> FEV1 1.08 (64%), FEV1% 60  Chest imaging;  CT chest 01/16/11 >> Multifocal b/l ASD with GGO CT chest 07/22/11 >> resolution of ASD CT chest 10/03/11 >> recurrence of nodular infiltrate Rt upper lobe  Medications:   Allergies as of 10/20/2019  Reactions   Other Rash, Shortness Of Breath   Sulfonamide Derivatives Shortness Of Breath, Rash   Oxycodone-aspirin Other (See Comments)   Couldn't hear    Diclofenac Other (See Comments)   Unknown reaction    Prolia [denosumab]    Muscle pain, bone pain   Rofecoxib Other (See Comments)   Unknown reaction    Ace Inhibitors Cough   Benazepril Hcl Other  (See Comments)   No PMH of angioedema; ACE-I caused cough   Tramadol Itching      Medication List       Accurate as of October 20, 2019 12:47 PM. If you have any questions, ask your nurse or doctor.        acetaminophen 500 MG tablet Commonly known as: TYLENOL Take 1,000 mg by mouth 3 (three) times daily as needed.   albuterol 108 (90 Base) MCG/ACT inhaler Commonly known as: ProAir HFA Inhale 2 puffs into the lungs every 6 (six) hours as needed for wheezing or shortness of breath.   calcium carbonate 600 MG tablet Commonly known as: OS-CAL Take 600 mg by mouth 2 (two) times daily.   Cetirizine HCl 10 MG Caps Take 1 capsule (10 mg total) by mouth daily.   famotidine 40 MG tablet Commonly known as: PEPCID Take 1 tablet (40 mg total) by mouth daily.   fluticasone 50 MCG/ACT nasal spray Commonly known as: FLONASE Place 2 sprays into both nostrils daily.   gabapentin 300 MG capsule Commonly known as: NEURONTIN Take 1 capsule (300 mg total) by mouth 3 (three) times daily.   hydrocortisone 5 MG tablet Commonly known as: CORTEF Take 2 tablets (10 mg total) by mouth daily with breakfast AND 1 tablet (5 mg total) daily with lunch.   hydrOXYzine 25 MG tablet Commonly known as: ATARAX/VISTARIL TAKE 1 TABLET(25 MG) BY MOUTH AT BEDTIME AS NEEDED FOR SLEEP   LORazepam 1 MG tablet Commonly known as: ATIVAN TAKE 1/2 TABLET BY MOUTH EVERY 8- 12 HOURS AS NEEDED   losartan 100 MG tablet Commonly known as: COZAAR TAKE 1 TABLET(100 MG) BY MOUTH DAILY   meloxicam 7.5 MG tablet Commonly known as: MOBIC TAKE 1 TABLET(7.5 MG) BY MOUTH DAILY   mometasone 220 MCG/INH inhaler Commonly known as: Asmanex (60 Metered Doses) Inhale 2 puffs into the lungs daily.   nystatin-triamcinolone ointment Commonly known as: MYCOLOG Apply 1 application topically 2 (two) times daily.   sertraline 50 MG tablet Commonly known as: ZOLOFT Take 3 tablets (150 mg total) by mouth daily.   thiamine  100 MG tablet Commonly known as: VITAMIN B-1 Take 100 mg by mouth daily as needed.   Vitamin D3 50 MCG (2000 UT) capsule Take 2,000 Units by mouth daily.       Past Surgical History:  She  has a past surgical history that includes Shoulder surgery (Left, 08-2008); Total abdominal hysterectomy; Tonsillectomy and adenoidectomy; colonoscopy with polypectomy (06/2013); Nasal sinus surgery; Appendectomy; Esophageal dilation; Bronchoscopy (12-2010); Cataract extraction w/ intraocular lens  implant, bilateral (Bilateral); Knee arthroscopy (Right, 06/18/2015); Tonsillectomy; Cholecystectomy (N/A, 05/23/2016); Upper gastrointestinal endoscopy; Colonoscopy; Polypectomy; and Adenoidectomy.  Family History:  Her family history includes Alzheimer's disease in her mother; Arthritis in her father; Cancer in her son; Colitis in her mother; Diabetes in her brother and paternal grandmother; Heart attack in her paternal grandmother; Hypertension in her brother, father, and mother; Irritable bowel syndrome in her mother; Non-Hodgkin's lymphoma in her brother; Other in her son; Pulmonary embolism in her father; Rheum arthritis in her  father.  Social History:  She  reports that she quit smoking about 50 years ago. Her smoking use included cigarettes. She has a 15.00 pack-year smoking history. She has never used smokeless tobacco. She reports that she does not drink alcohol or use drugs.

## 2019-10-21 NOTE — Telephone Encounter (Signed)
Nucala forms were returned to me this morning. There were 2 places on the form that needed to be signed. Pt only signed one of them and she did not fill out the financial section that is required to be considered for patient assistance. Pt has a pending injection appointment on 11/03/2019. Forms can finished then as it is hard for the pt to get around.

## 2019-10-24 ENCOUNTER — Telehealth: Payer: Self-pay | Admitting: Pulmonary Disease

## 2019-10-24 NOTE — Telephone Encounter (Signed)
Nucala Order: 100mg  #1 Vial Order Date: 10/24/19  Expected date of arrival: 10/25/19  Ordered by: Parke Poisson, [place]  [facility]

## 2019-10-25 NOTE — Telephone Encounter (Signed)
Nucala Shipment Received: 100mg  #1 vial Medication arrival date: 10/25/2019 Lot #: [id number] Exp date: 11/2022 Received by: Desmond Dike, [place]

## 2019-10-31 ENCOUNTER — Other Ambulatory Visit: Payer: Self-pay

## 2019-10-31 ENCOUNTER — Ambulatory Visit (INDEPENDENT_AMBULATORY_CARE_PROVIDER_SITE_OTHER): Payer: Medicare Other | Admitting: Internal Medicine

## 2019-10-31 ENCOUNTER — Encounter: Payer: Self-pay | Admitting: Internal Medicine

## 2019-10-31 VITALS — BP 122/60 | HR 70 | Resp 16

## 2019-10-31 DIAGNOSIS — E2749 Other adrenocortical insufficiency: Secondary | ICD-10-CM

## 2019-10-31 NOTE — Patient Instructions (Signed)
 -   Continue Hydrocortisone 5 mg, ( 2 tablets with Breakfast ) and 1 tablet in the afternoon between 2-4 pm  - Please obtain a bracelet that says "adrenal insufficiency"    ADRENAL INSUFFICIENCY SICK DAY RULES:  Should you face an extreme emotional or physical stress such as trauma, surgery or acute illness, this will require extra steroid coverage so that the body can meet that stress.   Without increasing the steroid dose you may experience severe weakness, headache, dizziness, nausea and vomiting and possibly a more serious deterioration in health.  Typically the dose of steroids will only need to be increased for a couple of days if you have an illness that is transient and managed in the community.   If you are unable to take/absorb an increased dose of steroids orally because of vomiting or diarrhea, you will urgently require steroid injections and should present to an Emergency Department.  The general advice for any serious illness is as follows: 1. Double the normal daily steroid dose for up to 3 days if you have a temperature of more than 37.50C (99.23F) with signs of sickness, or severe emotional or physical distress 2. Contact your primary care doctor and Endocrinologist if the illness worsens or it lasts for more than 3 days.  3. In cases of severe illness, urgent medical assistance should be promptly sought. 4. If you experience vomiting/diarrhea or are unable to take steroids by mouth, please administer the Hydrocortisone injection kit and seek urgent medical help.
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
## 2019-11-01 ENCOUNTER — Encounter: Payer: Self-pay | Admitting: Internal Medicine

## 2019-11-01 NOTE — Telephone Encounter (Signed)
Forms will be signed by pt at her upcoming appointment 11/03/2019. These are located in the biologics folder in the injection room.

## 2019-11-02 ENCOUNTER — Ambulatory Visit: Payer: Medicare Other

## 2019-11-03 ENCOUNTER — Ambulatory Visit (INDEPENDENT_AMBULATORY_CARE_PROVIDER_SITE_OTHER): Payer: Medicare Other

## 2019-11-03 ENCOUNTER — Other Ambulatory Visit: Payer: Self-pay

## 2019-11-03 DIAGNOSIS — J8289 Other pulmonary eosinophilia, not elsewhere classified: Secondary | ICD-10-CM

## 2019-11-03 DIAGNOSIS — J4550 Severe persistent asthma, uncomplicated: Secondary | ICD-10-CM | POA: Diagnosis not present

## 2019-11-03 MED ORDER — MEPOLIZUMAB 100 MG ~~LOC~~ SOLR
100.0000 mg | SUBCUTANEOUS | Status: DC
  Administered 2019-11-03: 100 mg via SUBCUTANEOUS

## 2019-11-03 NOTE — Telephone Encounter (Signed)
Form was signed by the pt when she came in for her appointment this morning. It has been faxed to [facility] [place].

## 2019-11-03 NOTE — Progress Notes (Signed)
All questions were answered by the patient before medication was administered. Have you been hospitalized in the last 10 days? No Do you have a fever? No Do you have a cough? No Do you have a headache or sore throat? No  

## 2019-11-04 ENCOUNTER — Encounter: Payer: Self-pay | Admitting: Internal Medicine

## 2019-11-04 DIAGNOSIS — M8949 Other hypertrophic osteoarthropathy, multiple sites: Secondary | ICD-10-CM

## 2019-11-04 DIAGNOSIS — M159 Polyosteoarthritis, unspecified: Secondary | ICD-10-CM

## 2019-11-08 ENCOUNTER — Telehealth: Payer: Self-pay | Admitting: Pulmonary Disease

## 2019-11-08 NOTE — Telephone Encounter (Signed)
Spoke with [facility] [place]. They were needing to know if the pt will be self administering her Nucala. We are currently trying to get her switched to an auto injector so that she will not have to come to the office anymore. There is another telephone encounter opened on this matter. This message will be closed.

## 2019-11-10 ENCOUNTER — Telehealth: Payer: Self-pay | Admitting: Pharmacy Technician

## 2019-11-10 NOTE — Telephone Encounter (Signed)
Spoke with pt. She has been scheduled for her injection. Advised her that I am still working on trying to get her some assistance with the cost of medication and to be able to do injections at home. There is another message open in regards to that.

## 2019-11-10 NOTE — Telephone Encounter (Signed)
Spoke with pt over the phone. She has been scheduled for a Nucala injection on 12/01/2019 at 1100. Nothing further was needed.

## 2019-11-11 NOTE — Telephone Encounter (Signed)
As of today, I have not heard back about this matter. Called [facility] to follow up. Spoke with Corrine. States that they are needing information from the pt. They need a print out of the pt's out of pocket prescription cost for the year 2020.  Spoke with pt. States that she spoke with [facility] yesterday. They have already advised her of this. Pt is waiting for her local pharmacy to mail her a copy of this. Once she receives it, she is going to bring it to the office for [place] to send to [facility]. Will continue to follow up.

## 2019-11-16 ENCOUNTER — Encounter: Payer: Self-pay | Admitting: Internal Medicine

## 2019-11-16 ENCOUNTER — Telehealth: Payer: Self-pay

## 2019-11-16 MED ORDER — PREDNISONE 5 MG PO TABS: 5.0000 mg | ORAL_TABLET | Freq: Every day | ORAL | 3 refills | Status: DC

## 2019-11-16 NOTE — Telephone Encounter (Signed)
Dr. Kelton Pillar, I spoke with pt about your message and she understood. She had two additional questions, the first one was do you know where she would get that bracelet you mentioned. Also she states she picked up a rx on 09/14/19 for 1mg  Prednisone. She states since has over 100, she wanted to know can she just take 5 of those since she has already paid for them

## 2019-11-16 NOTE — Telephone Encounter (Signed)
Patient called in stating that she would like to be put back on her 5mg  Prednisone. States that Dr told her to let Dr know if she wanted to start it back, also states she has an old Rx from another doctor for the prescription I told patient not to take it until she spoke with Dr or nurse first      Please call and advise

## 2019-11-16 NOTE — Telephone Encounter (Signed)
Pt also called, this is being addressed in her phone message.

## 2019-11-16 NOTE — Telephone Encounter (Signed)
When I spoke with pt she stated pt she stated ok to send her a Pharmacist, community message. Message sent with information.

## 2019-11-17 ENCOUNTER — Ambulatory Visit: Payer: Medicare Other | Admitting: Internal Medicine

## 2019-11-18 NOTE — Telephone Encounter (Signed)
At this time, we are still waiting for Patient to bring copy of out of pocket cost for 2020.

## 2019-11-19 ENCOUNTER — Other Ambulatory Visit: Payer: Self-pay | Admitting: Internal Medicine

## 2019-11-22 ENCOUNTER — Telehealth: Payer: Self-pay

## 2019-11-22 ENCOUNTER — Ambulatory Visit: Payer: Medicare Other | Admitting: Internal Medicine

## 2019-11-22 NOTE — Telephone Encounter (Signed)
Nucala Order: 100mg  #1 Vial Order Date: 11/22/19   Expected date of arrival: 11/23/19 Ordered by: Len Blalock, [place]  [facility]

## 2019-11-23 NOTE — Telephone Encounter (Signed)
Nucala Shipment Received: 100mg  #1 vial Medication arrival date: 11/23/19  Lot #: [id number] Exp date: 01/2023 Received by: Len Blalock, CMA

## 2019-11-25 ENCOUNTER — Encounter: Payer: Self-pay | Admitting: Internal Medicine

## 2019-11-27 NOTE — Patient Instructions (Addendum)
  Tests ordered today. Your results will be released to [place] (or called to you) after review.  If any changes need to be made, you will be notified at that same time.  Medications reviewed and updated.  Changes include :   Add gabapentin 100 mg at bedtime.  Increase sertraline to 200 mg daily.   Your prescription(s) have been submitted to your pharmacy. Please take as directed and contact our office if you believe you are having problem(s) with the medication(s).    Please followup in 6 months, sooner if needed
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
## 2019-11-28 ENCOUNTER — Ambulatory Visit (INDEPENDENT_AMBULATORY_CARE_PROVIDER_SITE_OTHER): Payer: Medicare Other | Admitting: Internal Medicine

## 2019-11-28 ENCOUNTER — Encounter: Payer: Self-pay | Admitting: Internal Medicine

## 2019-11-28 ENCOUNTER — Other Ambulatory Visit (INDEPENDENT_AMBULATORY_CARE_PROVIDER_SITE_OTHER): Payer: Medicare Other

## 2019-11-28 ENCOUNTER — Other Ambulatory Visit: Payer: Self-pay

## 2019-11-28 VITALS — BP 140/78 | HR 61 | Temp 98.0°F | Resp 16 | Ht 60.0 in | Wt 158.0 lb

## 2019-11-28 DIAGNOSIS — E7849 Other hyperlipidemia: Secondary | ICD-10-CM

## 2019-11-28 DIAGNOSIS — F419 Anxiety disorder, unspecified: Secondary | ICD-10-CM

## 2019-11-28 DIAGNOSIS — K219 Gastro-esophageal reflux disease without esophagitis: Secondary | ICD-10-CM | POA: Diagnosis not present

## 2019-11-28 DIAGNOSIS — R202 Paresthesia of skin: Secondary | ICD-10-CM

## 2019-11-28 DIAGNOSIS — I10 Essential (primary) hypertension: Secondary | ICD-10-CM

## 2019-11-28 DIAGNOSIS — R739 Hyperglycemia, unspecified: Secondary | ICD-10-CM | POA: Diagnosis not present

## 2019-11-28 DIAGNOSIS — F411 Generalized anxiety disorder: Secondary | ICD-10-CM | POA: Diagnosis not present

## 2019-11-28 DIAGNOSIS — M1990 Unspecified osteoarthritis, unspecified site: Secondary | ICD-10-CM | POA: Diagnosis not present

## 2019-11-28 DIAGNOSIS — F3289 Other specified depressive episodes: Secondary | ICD-10-CM

## 2019-11-28 LAB — CBC WITH DIFFERENTIAL/PLATELET
Basophils Absolute: 0.1 10*3/uL (ref 0.0–0.1)
Basophils Relative: 0.6 % (ref 0.0–3.0)
Eosinophils Absolute: 0.1 10*3/uL (ref 0.0–0.7)
Eosinophils Relative: 0.6 % (ref 0.0–5.0)
HCT: 40 % (ref 36.0–46.0)
Hemoglobin: 13.3 g/dL (ref 12.0–15.0)
Lymphocytes Relative: 15.9 % (ref 12.0–46.0)
Lymphs Abs: 1.4 10*3/uL (ref 0.7–4.0)
MCHC: 33.2 g/dL (ref 30.0–36.0)
MCV: 92.2 fl (ref 78.0–100.0)
Monocytes Absolute: 0.5 10*3/uL (ref 0.1–1.0)
Monocytes Relative: 6.2 % (ref 3.0–12.0)
Neutro Abs: 6.8 10*3/uL (ref 1.4–7.7)
Neutrophils Relative %: 76.7 % (ref 43.0–77.0)
Platelets: 225 10*3/uL (ref 150.0–400.0)
RBC: 4.34 Mil/uL (ref 3.87–5.11)
RDW: 13.1 % (ref 11.5–15.5)
WBC: 8.9 10*3/uL (ref 4.0–10.5)

## 2019-11-28 MED ORDER — SERTRALINE HCL 100 MG PO TABS: 200.0000 mg | ORAL_TABLET | Freq: Every day | ORAL | 1 refills | Status: DC

## 2019-11-28 MED ORDER — GABAPENTIN 100 MG PO CAPS: 100.0000 mg | ORAL_CAPSULE | Freq: Every day | ORAL | 3 refills | Status: DC

## 2019-11-28 MED ORDER — LORAZEPAM 1 MG PO TABS: ORAL_TABLET | ORAL | 0 refills | Status: DC

## 2019-11-28 NOTE — Assessment & Plan Note (Signed)
BP well controlled Current regimen effective and well tolerated Continue current medications at current doses cmp  

## 2019-11-28 NOTE — Assessment & Plan Note (Signed)
a1c

## 2019-11-28 NOTE — Assessment & Plan Note (Signed)
Not controlled Discussed options Inc sertraline to 200 mg daily If this is not effective may need to change medications

## 2019-11-28 NOTE — Assessment & Plan Note (Signed)
Controlled Continue sertraline Ativan prn

## 2019-11-28 NOTE — Assessment & Plan Note (Signed)
Check lipid panel  

## 2019-11-28 NOTE — Assessment & Plan Note (Signed)
Diffuse arthritis pain Taking tylenol and meloxicam Requested referral to pain clinic - ordered last week

## 2019-11-28 NOTE — Assessment & Plan Note (Signed)
GERD controlled Continue daily medication  

## 2019-11-28 NOTE — Assessment & Plan Note (Signed)
B12 level 

## 2019-11-29 LAB — COMPREHENSIVE METABOLIC PANEL
ALT: 20 U/L (ref 0–35)
AST: 28 U/L (ref 0–37)
Albumin: 4.5 g/dL (ref 3.5–5.2)
Alkaline Phosphatase: 67 U/L (ref 39–117)
BUN: 20 mg/dL (ref 6–23)
CO2: 28 mEq/L (ref 19–32)
Calcium: 10.1 mg/dL (ref 8.4–10.5)
Chloride: 101 mEq/L (ref 96–112)
Creatinine, Ser: 0.92 mg/dL (ref 0.40–1.20)
GFR: 59.26 mL/min — ABNORMAL LOW (ref >60.00)
Glucose, Bld: 100 mg/dL — ABNORMAL HIGH (ref 70–99)
Potassium: 4.8 mEq/L (ref 3.5–5.1)
Sodium: 139 mEq/L (ref 135–145)
Total Bilirubin: 0.3 mg/dL (ref 0.2–1.2)
Total Protein: 7.5 g/dL (ref 6.0–8.3)

## 2019-11-29 LAB — LIPID PANEL
Cholesterol: 206 mg/dL — ABNORMAL HIGH (ref 0–200)
HDL: 75.5 mg/dL (ref >39.00)
LDL Cholesterol: 103 mg/dL — ABNORMAL HIGH (ref 0–99)
NonHDL: 130.17
Total CHOL/HDL Ratio: 3
Triglycerides: 138 mg/dL (ref 0.0–149.0)
VLDL: 27.6 mg/dL (ref 0.0–40.0)

## 2019-11-29 LAB — VITAMIN B12: Vitamin B-12: 369 pg/mL (ref 211–911)

## 2019-11-29 LAB — TSH: TSH: 1.34 u[IU]/mL (ref 0.35–4.50)

## 2019-12-01 ENCOUNTER — Other Ambulatory Visit: Payer: Self-pay

## 2019-12-01 ENCOUNTER — Encounter: Payer: Self-pay | Admitting: Internal Medicine

## 2019-12-01 ENCOUNTER — Ambulatory Visit (INDEPENDENT_AMBULATORY_CARE_PROVIDER_SITE_OTHER): Payer: Medicare Other

## 2019-12-01 DIAGNOSIS — J4550 Severe persistent asthma, uncomplicated: Secondary | ICD-10-CM | POA: Diagnosis not present

## 2019-12-01 MED ORDER — ASMANEX (120 METERED DOSES) 220 MCG/INH IN AEPB: 2.0000 | INHALATION_SPRAY | Freq: Every day | RESPIRATORY_TRACT | 0 refills | Status: DC

## 2019-12-01 MED ORDER — MEPOLIZUMAB 100 MG ~~LOC~~ SOLR
100.0000 mg | Freq: Once | SUBCUTANEOUS | Status: AC
  Administered 2019-12-01: 100 mg via SUBCUTANEOUS

## 2019-12-01 NOTE — Addendum Note (Signed)
Addended by: Parke Poisson E on: 12/01/2019 12:21 PM   Modules accepted: Orders

## 2019-12-01 NOTE — Progress Notes (Signed)
Have you been hospitalized within the last 10 days?  No Do you have a fever?  No Do you have a cough?  No Do you have a headache or sore throat? No Do you have your Epi Pen visible and is it within date?  Yes 

## 2019-12-02 ENCOUNTER — Encounter: Payer: Self-pay | Admitting: Internal Medicine

## 2019-12-02 NOTE — Telephone Encounter (Signed)
Patient came to the office yesterday 12.3.20 for injection and brought a copy of her out of pocket costs but only through 11.10.20.  She also brought copy of income.  Called [facility] at patient's request to request copy of out of pocket costs from 11.10.20 through now.  Will await fax.

## 2019-12-03 ENCOUNTER — Encounter: Payer: Self-pay | Admitting: Internal Medicine

## 2019-12-05 ENCOUNTER — Telehealth: Payer: Self-pay

## 2019-12-05 ENCOUNTER — Encounter: Payer: Self-pay | Admitting: Internal Medicine

## 2019-12-05 NOTE — Telephone Encounter (Signed)
Pt informed thru my chart that on 11/16/2019 a quantity of 90 pills with 3 refills was sent to the pharmacy in her chart and that she should contact the pharmacy.

## 2019-12-05 NOTE — Telephone Encounter (Signed)
MEDICATION: predniSONE (DELTASONE) 5 MG tablet  PHARMACY:  [id number] - [place], [place]  IS THIS A 90 DAY SUPPLY :   IS PATIENT OUT OF MEDICATION:   IF NOT; HOW MUCH IS LEFT:   LAST APPOINTMENT DATE: @11 /18/2020  NEXT APPOINTMENT DATE:@5 /02/2020  DO WE HAVE YOUR PERMISSION TO LEAVE A DETAILED MESSAGE:  OTHER COMMENTS:    **Let patient know to contact pharmacy at the end of the day to make sure medication is ready. **  ** Please notify patient to allow 48-72 hours to process**  **Encourage patient to contact the pharmacy for refills or they can request refills through [facility]**

## 2019-12-07 NOTE — Telephone Encounter (Signed)
Out of pocket costs never received  [facility] and spoke with pharmacist to have it refaxed to [facility].  Will await.

## 2019-12-12 NOTE — Telephone Encounter (Signed)
Spoke with Janett Billow. States that this fax was received late Friday afternoon. She is going to bring this fax to the injection lab so that we may follow up on this matter. Will update chart once this is done.

## 2019-12-13 NOTE — Telephone Encounter (Signed)
Fax has been received from [place]. All information has been faxed to [facility] to [place] for patient assistance approval. Will await decision.

## 2019-12-15 NOTE — Telephone Encounter (Signed)
Received a fax from [employer] to [place]. It states that the pt is not eligible for the Patient Assistance Program because she does not meet one or more of the program's requirements. The fax does not name what requirements she doesn't meet.

## 2019-12-19 ENCOUNTER — Telehealth: Payer: Self-pay | Admitting: Pulmonary Disease

## 2019-12-19 NOTE — Telephone Encounter (Signed)
Nucala Order: 100mg  #1 Vial Order Date: 12/19/2019 Expected date of arrival: 12/20/2019 Ordered by: Desmond Dike, [place]  [facility]ty [facility]: [facility]

## 2019-12-19 NOTE — Telephone Encounter (Signed)
ATC pt, the line rang several times and I could not leave a message. Will try back.

## 2019-12-20 NOTE — Telephone Encounter (Signed)
Nucala Shipment Received: 100mg  #1 vial Medication arrival date: 12/20/2019 Lot #: [id number] Exp date: 08/2021 Received by: Desmond Dike, [place]

## 2019-12-27 NOTE — Telephone Encounter (Signed)
Pt has an appointment on 12/28/2019 for an injection. This information will be discussed with her at that time.

## 2019-12-28 ENCOUNTER — Other Ambulatory Visit: Payer: Self-pay

## 2019-12-28 ENCOUNTER — Ambulatory Visit: Payer: Medicare Other

## 2019-12-28 DIAGNOSIS — J4550 Severe persistent asthma, uncomplicated: Secondary | ICD-10-CM

## 2019-12-28 MED ORDER — MEPOLIZUMAB 100 MG ~~LOC~~ SOLR
100.0000 mg | SUBCUTANEOUS | Status: DC
  Administered 2019-12-28: 100 mg via SUBCUTANEOUS

## 2019-12-28 NOTE — Progress Notes (Signed)
Have you been hospitalized within the last 10 days?  No Do you have a fever?  No Do you have a cough?  No Do you have a headache or sore throat? No Do you have your Epi Pen visible and is it within date?  Yes 

## 2020-01-18 ENCOUNTER — Telehealth: Payer: Self-pay | Admitting: Pulmonary Disease

## 2020-01-18 NOTE — Telephone Encounter (Signed)
Nucala Order: 100mg  #1 Vial Order Date: 01/18/20 Expected date of arrival: 01/19/20 Ordered by: [place]: [facility]

## 2020-01-19 NOTE — Telephone Encounter (Signed)
Nucala Shipment Received: 100mg  #1 vial Medication arrival date: 01/19/20 Lot #: [id number] Exp date: 10/29/2021 Received by: Elliot Dally

## 2020-01-25 ENCOUNTER — Other Ambulatory Visit: Payer: Self-pay | Admitting: Internal Medicine

## 2020-01-26 ENCOUNTER — Ambulatory Visit: Payer: Medicare Other

## 2020-01-26 ENCOUNTER — Telehealth: Payer: Self-pay | Admitting: Pulmonary Disease

## 2020-01-26 NOTE — Telephone Encounter (Signed)
Spoke with pt. Her appointment has been moved to 01/27/2020 at 1130. Advised her that we have tried to get her insurance to approve at home injections but they will only cover the medication under her medical plan which is [place]. Pt verbalized understanding.

## 2020-01-27 ENCOUNTER — Ambulatory Visit: Payer: Medicare Other

## 2020-01-31 ENCOUNTER — Other Ambulatory Visit: Payer: Self-pay

## 2020-01-31 ENCOUNTER — Ambulatory Visit (INDEPENDENT_AMBULATORY_CARE_PROVIDER_SITE_OTHER): Payer: Medicare Other

## 2020-01-31 DIAGNOSIS — J4550 Severe persistent asthma, uncomplicated: Secondary | ICD-10-CM | POA: Diagnosis not present

## 2020-01-31 MED ORDER — MEPOLIZUMAB 100 MG ~~LOC~~ SOLR
100.0000 mg | SUBCUTANEOUS | Status: DC
  Administered 2020-01-31: 100 mg via SUBCUTANEOUS

## 2020-01-31 NOTE — Progress Notes (Signed)
All questions were answered by the patient before medication was administered. Have you been hospitalized in the last 10 days? No Do you have a fever? No Do you have a cough? No Do you have a headache or sore throat? No  

## 2020-02-02 ENCOUNTER — Other Ambulatory Visit: Payer: Self-pay | Admitting: Internal Medicine

## 2020-02-02 ENCOUNTER — Telehealth: Payer: Self-pay

## 2020-02-02 DIAGNOSIS — R350 Frequency of micturition: Secondary | ICD-10-CM

## 2020-02-02 NOTE — Telephone Encounter (Signed)
Patient requesting to bring a urine sample to the office. States that she is having urinary frequency and burning with urination. States that she has the "kit" that she received from the office that has the supplies to get a urine sample. Would like to know if Dr Quay Burow could place the order and she could have her son bring her urine sample to the office tomorrow. Please advise.

## 2020-02-03 ENCOUNTER — Encounter: Payer: Self-pay | Admitting: Internal Medicine

## 2020-02-03 ENCOUNTER — Other Ambulatory Visit (INDEPENDENT_AMBULATORY_CARE_PROVIDER_SITE_OTHER): Payer: Medicare Other

## 2020-02-03 DIAGNOSIS — R350 Frequency of micturition: Secondary | ICD-10-CM | POA: Diagnosis not present

## 2020-02-03 LAB — URINALYSIS, ROUTINE W REFLEX MICROSCOPIC
Bilirubin Urine: NEGATIVE
Ketones, ur: NEGATIVE
Nitrite: NEGATIVE
Specific Gravity, Urine: 1.025 (ref 1.000–1.030)
Urine Glucose: NEGATIVE
Urobilinogen, UA: 0.2 (ref 0.0–1.0)
pH: 6 (ref 5.0–8.0)

## 2020-02-03 NOTE — Telephone Encounter (Signed)
Order placed. Pt aware.

## 2020-02-03 NOTE — Telephone Encounter (Signed)
Patient is requesting a call back from [place]. She is highly upset that she may not have an ABX before the weekend. Patient has been informed we have not got an results from lab yet.

## 2020-02-03 NOTE — Telephone Encounter (Signed)
Ok to place order 

## 2020-02-03 NOTE — Telephone Encounter (Signed)
Spoke with patient and advised as soon as we get results for UA we will keep in touch through my chart to let her know when abx are sent in.

## 2020-02-03 NOTE — Telephone Encounter (Signed)
    Please call patient to advise.  Patient states her son can bring sample to the office

## 2020-02-04 ENCOUNTER — Other Ambulatory Visit: Payer: Self-pay | Admitting: Internal Medicine

## 2020-02-04 ENCOUNTER — Encounter: Payer: Self-pay | Admitting: Internal Medicine

## 2020-02-04 MED ORDER — AMOXICILLIN-POT CLAVULANATE 875-125 MG PO TABS: 1.0000 | ORAL_TABLET | Freq: Two times a day (BID) | ORAL | 0 refills | Status: DC

## 2020-02-05 ENCOUNTER — Encounter: Payer: Self-pay | Admitting: Internal Medicine

## 2020-02-05 LAB — URINE CULTURE

## 2020-02-15 ENCOUNTER — Encounter: Payer: Self-pay | Admitting: Internal Medicine

## 2020-02-20 ENCOUNTER — Telehealth: Payer: Self-pay | Admitting: Pulmonary Disease

## 2020-02-20 NOTE — Telephone Encounter (Signed)
Spoke with patient. She requested to have her OV after her Nucala injection on 3/2 at 11am. She has been scheduled to see Beth at 3/2 at 1130.   Nothing further needed at time of call.

## 2020-02-20 NOTE — Telephone Encounter (Signed)
Nucala Order: 100mg  #1 Vial Order Date: 02/20/2020 Expected date of arrival: 02/21/2020 Ordered by: Desmond Dike, [place]  Specialty [facility]

## 2020-02-21 ENCOUNTER — Other Ambulatory Visit: Payer: Self-pay | Admitting: Internal Medicine

## 2020-02-21 NOTE — Telephone Encounter (Signed)
Nucala Shipment Received: 100mg  #1 vial Medication arrival date: 02/21/2020 Lot #: [id number] Exp date: 05/2023 Received by: Desmond Dike, [place]

## 2020-02-22 ENCOUNTER — Ambulatory Visit: Payer: Medicare Other | Admitting: Pulmonary Disease

## 2020-02-28 ENCOUNTER — Ambulatory Visit (INDEPENDENT_AMBULATORY_CARE_PROVIDER_SITE_OTHER): Payer: Medicare Other | Admitting: Primary Care

## 2020-02-28 ENCOUNTER — Other Ambulatory Visit: Payer: Self-pay

## 2020-02-28 ENCOUNTER — Encounter: Payer: Self-pay | Admitting: Primary Care

## 2020-02-28 ENCOUNTER — Ambulatory Visit (INDEPENDENT_AMBULATORY_CARE_PROVIDER_SITE_OTHER): Payer: Medicare Other

## 2020-02-28 VITALS — BP 142/84 | HR 70 | Temp 97.3°F | Ht 60.0 in | Wt 150.8 lb

## 2020-02-28 DIAGNOSIS — E2749 Other adrenocortical insufficiency: Secondary | ICD-10-CM | POA: Diagnosis not present

## 2020-02-28 DIAGNOSIS — J309 Allergic rhinitis, unspecified: Secondary | ICD-10-CM

## 2020-02-28 DIAGNOSIS — J45998 Other asthma: Secondary | ICD-10-CM

## 2020-02-28 DIAGNOSIS — J4550 Severe persistent asthma, uncomplicated: Secondary | ICD-10-CM

## 2020-02-28 MED ORDER — ASMANEX (60 METERED DOSES) 220 MCG/INH IN AEPB: 2.0000 | INHALATION_SPRAY | Freq: Every day | RESPIRATORY_TRACT | 3 refills | Status: DC

## 2020-02-28 MED ORDER — MEPOLIZUMAB 100 MG ~~LOC~~ SOLR
100.0000 mg | SUBCUTANEOUS | Status: DC
  Administered 2020-02-28: 100 mg via SUBCUTANEOUS

## 2020-02-28 MED ORDER — ALBUTEROL SULFATE HFA 108 (90 BASE) MCG/ACT IN AERS: 2.0000 | INHALATION_SPRAY | Freq: Four times a day (QID) | RESPIRATORY_TRACT | 5 refills | Status: DC | PRN

## 2020-02-28 NOTE — Patient Instructions (Addendum)
Pleasure meeting you today Lisa Larson -You look well. Your lungs sounded clear with no overt wheezing.  -Your labs in November 2020 showed normal eosinophil count (these were very high in 2018 and since starting Nucala have improved)  Allergic Asthma:  - Continue Nucala injection every 4 weeks - If you hear/experience more wheezing, resume Asmanex 2 puffs twice daily - Use albuterol rescue inhaler 2 puffs every 4-6 hours as needed only for breakthrough shortness of breath or wheezing   Follow-up: - 6 months with Dr. Halford Chessman

## 2020-02-28 NOTE — Assessment & Plan Note (Addendum)
-   Well controlled, no recent exacerbations  - Continue Nucala 100mg  q 28 days  - Resume Asmanex 2 puffs twice daily if experiencing more wheezing - Use albuterol rescue inhaler 2 puffs every 4-6 hours as needed for breakthrough shortness of breath or wheezing

## 2020-02-28 NOTE — Progress Notes (Signed)
All questions were answered by the patient before medication was administered. Have you been hospitalized in the last 10 days? No Do you have a fever? No Do you have a cough? No Do you have a headache or sore throat? No  
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
## 2020-02-28 NOTE — Assessment & Plan Note (Signed)
-   Maintained on 5mg  prednisone daily with breakfast

## 2020-02-28 NOTE — Assessment & Plan Note (Signed)
-   Continue Zyrtec 10mg  daily and flonase nasal spray

## 2020-02-29 NOTE — Progress Notes (Signed)
Reviewed and agree with assessment/plan.   Sanyia Dini, MD  Pulmonary/Critical Care 12/24/2016, 12:24 PM Pager:  [phone]  

## 2020-03-12 ENCOUNTER — Telehealth: Payer: Self-pay | Admitting: Internal Medicine

## 2020-03-12 ENCOUNTER — Encounter: Payer: Self-pay | Admitting: Internal Medicine

## 2020-03-12 NOTE — Telephone Encounter (Signed)
New message:   Pt states she thinks she still have as another UTI? And wants to know if she can come and pick up a kit in the morning to test her urine.

## 2020-03-13 ENCOUNTER — Encounter: Payer: Self-pay | Admitting: Internal Medicine

## 2020-03-13 NOTE — Telephone Encounter (Signed)
Addressed through mychart

## 2020-03-14 ENCOUNTER — Encounter: Payer: Self-pay | Admitting: Internal Medicine

## 2020-03-14 ENCOUNTER — Other Ambulatory Visit (INDEPENDENT_AMBULATORY_CARE_PROVIDER_SITE_OTHER): Payer: Medicare Other

## 2020-03-14 ENCOUNTER — Other Ambulatory Visit: Payer: Self-pay

## 2020-03-14 ENCOUNTER — Other Ambulatory Visit: Payer: Self-pay | Admitting: *Deleted

## 2020-03-14 DIAGNOSIS — R350 Frequency of micturition: Secondary | ICD-10-CM | POA: Diagnosis not present

## 2020-03-14 LAB — URINALYSIS, ROUTINE W REFLEX MICROSCOPIC
Bilirubin Urine: NEGATIVE
Hgb urine dipstick: NEGATIVE
Ketones, ur: NEGATIVE
Nitrite: POSITIVE — AB
RBC / HPF: NONE SEEN (ref 0–?)
Specific Gravity, Urine: 1.025 (ref 1.000–1.030)
Total Protein, Urine: NEGATIVE
Urine Glucose: NEGATIVE
Urobilinogen, UA: 0.2 (ref 0.0–1.0)
pH: 5.5 (ref 5.0–8.0)

## 2020-03-14 NOTE — Telephone Encounter (Signed)
Called pt in regards. She wants to try testing the urine she got this morning. I told her in the cup is contaminated then the culture will probably not be accurate. I explained the cup has to be completely sterile for a good urine culture. Pt insisted that we try testing the current specimen even though she had to reuse the cup. I told her we could try it since it is hard for her to get a specimen.

## 2020-03-15 ENCOUNTER — Telehealth: Payer: Self-pay | Admitting: Internal Medicine

## 2020-03-15 MED ORDER — CEPHALEXIN 500 MG PO CAPS: 500.0000 mg | ORAL_CAPSULE | Freq: Two times a day (BID) | ORAL | 0 refills | Status: DC

## 2020-03-15 NOTE — Telephone Encounter (Signed)
Urine shows an infection.  Culture is still pending.  Lets start Keflex-has been sent to pharmacy

## 2020-03-15 NOTE — Telephone Encounter (Signed)
Pt aware of response below.  

## 2020-03-16 LAB — URINE CULTURE
MICRO NUMBER:: 10261672
SPECIMEN QUALITY:: ADEQUATE

## 2020-03-22 ENCOUNTER — Telehealth: Payer: Self-pay | Admitting: Primary Care

## 2020-03-22 NOTE — Telephone Encounter (Signed)
Spoke with pt. She has been scheduled for her injection on 03/27/2020 at 1030.  Nucala Order: 100mg  #1 Vial Order Date: 03/22/2020 Expected date of arrival: 03/23/2020 Ordered by: Desmond Dike, [place]  Specialty [facility]: [facility]

## 2020-03-23 NOTE — Telephone Encounter (Signed)
Nucala Shipment Received: 100mg  #1 vial Medication arrival date: 03/23/2020 Lot #: [id number] Exp date: 05/2023 Received by: Desmond Dike, [place]

## 2020-03-26 ENCOUNTER — Telehealth: Payer: Self-pay | Admitting: Pulmonary Disease

## 2020-03-27 ENCOUNTER — Ambulatory Visit: Payer: Medicare Other

## 2020-03-27 NOTE — Telephone Encounter (Signed)
Spoke with pt. Apologized that we didn't call her back yesterday. Her appointment has been moved to 04/02/2020 at 1130. Nothing further needed.

## 2020-03-29 ENCOUNTER — Other Ambulatory Visit: Payer: Self-pay | Admitting: Internal Medicine

## 2020-04-02 ENCOUNTER — Ambulatory Visit: Payer: Medicare Other

## 2020-04-06 NOTE — Telephone Encounter (Signed)
Dr. Halford Chessman please advise on patients mychart message:   Lisa Larson been coughing for about a week.  It's usually a wet cough and I have some yellowish colored phlegm.  I have no other symptoms.  Never ran a fever.  Etc.   Should I see Dr. Halford Chessman? What do you suggest?   Thanks,  Dot

## 2020-04-29 ENCOUNTER — Other Ambulatory Visit: Payer: Self-pay | Admitting: Internal Medicine

## 2020-04-30 ENCOUNTER — Other Ambulatory Visit: Payer: Self-pay

## 2020-04-30 ENCOUNTER — Telehealth (INDEPENDENT_AMBULATORY_CARE_PROVIDER_SITE_OTHER): Payer: Medicare Other | Admitting: Internal Medicine

## 2020-04-30 DIAGNOSIS — E2749 Other adrenocortical insufficiency: Secondary | ICD-10-CM | POA: Diagnosis not present

## 2020-04-30 MED ORDER — PREDNISONE 5 MG PO TABS: 5.0000 mg | ORAL_TABLET | Freq: Every day | ORAL | 3 refills | Status: DC
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
## 2020-05-23 ENCOUNTER — Other Ambulatory Visit: Payer: Self-pay | Admitting: Internal Medicine

## 2020-05-24 NOTE — Patient Instructions (Addendum)
  Blood work was ordered.     Medications reviewed and updated.  Changes include :     Stat mirabegron 25 mg daily for your bladder  Your prescription(s) have been submitted to your pharmacy. Please take as directed and contact our office if you believe you are having problem(s) with the medication(s).     Please followup in 6 months
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
## 2020-05-25 ENCOUNTER — Ambulatory Visit (INDEPENDENT_AMBULATORY_CARE_PROVIDER_SITE_OTHER): Payer: Medicare Other | Admitting: Internal Medicine

## 2020-05-25 ENCOUNTER — Encounter: Payer: Self-pay | Admitting: Internal Medicine

## 2020-05-25 ENCOUNTER — Other Ambulatory Visit: Payer: Self-pay

## 2020-05-25 VITALS — BP 142/78 | HR 66 | Temp 98.7°F | Resp 16 | Ht 60.0 in

## 2020-05-25 DIAGNOSIS — N39498 Other specified urinary incontinence: Secondary | ICD-10-CM

## 2020-05-25 DIAGNOSIS — F3289 Other specified depressive episodes: Secondary | ICD-10-CM | POA: Diagnosis not present

## 2020-05-25 DIAGNOSIS — I10 Essential (primary) hypertension: Secondary | ICD-10-CM | POA: Diagnosis not present

## 2020-05-25 DIAGNOSIS — R739 Hyperglycemia, unspecified: Secondary | ICD-10-CM

## 2020-05-25 DIAGNOSIS — K219 Gastro-esophageal reflux disease without esophagitis: Secondary | ICD-10-CM | POA: Diagnosis not present

## 2020-05-25 DIAGNOSIS — R350 Frequency of micturition: Secondary | ICD-10-CM | POA: Diagnosis not present

## 2020-05-25 DIAGNOSIS — F411 Generalized anxiety disorder: Secondary | ICD-10-CM | POA: Diagnosis not present

## 2020-05-25 DIAGNOSIS — F419 Anxiety disorder, unspecified: Secondary | ICD-10-CM | POA: Diagnosis not present

## 2020-05-25 DIAGNOSIS — R32 Unspecified urinary incontinence: Secondary | ICD-10-CM | POA: Insufficient documentation

## 2020-05-25 LAB — COMPREHENSIVE METABOLIC PANEL
ALT: 19 U/L (ref 0–35)
AST: 23 U/L (ref 0–37)
Albumin: 4.5 g/dL (ref 3.5–5.2)
Alkaline Phosphatase: 56 U/L (ref 39–117)
BUN: 28 mg/dL — ABNORMAL HIGH (ref 6–23)
CO2: 30 mEq/L (ref 19–32)
Calcium: 10.4 mg/dL (ref 8.4–10.5)
Chloride: 103 mEq/L (ref 96–112)
Creatinine, Ser: 0.85 mg/dL (ref 0.40–1.20)
GFR: 64.84 mL/min (ref >60.00)
Glucose, Bld: 96 mg/dL (ref 70–99)
Potassium: 4.5 mEq/L (ref 3.5–5.1)
Sodium: 139 mEq/L (ref 135–145)
Total Bilirubin: 0.5 mg/dL (ref 0.2–1.2)
Total Protein: 7.1 g/dL (ref 6.0–8.3)

## 2020-05-25 LAB — LIPID PANEL
Cholesterol: 186 mg/dL (ref 0–200)
HDL: 67.6 mg/dL (ref >39.00)
LDL Cholesterol: 90 mg/dL (ref 0–99)
NonHDL: 118.52
Total CHOL/HDL Ratio: 3
Triglycerides: 142 mg/dL (ref 0.0–149.0)
VLDL: 28.4 mg/dL (ref 0.0–40.0)

## 2020-05-25 LAB — CBC WITH DIFFERENTIAL/PLATELET
Basophils Absolute: 0 10*3/uL (ref 0.0–0.1)
Basophils Relative: 0.3 % (ref 0.0–3.0)
Eosinophils Absolute: 0.3 10*3/uL (ref 0.0–0.7)
Eosinophils Relative: 3.1 % (ref 0.0–5.0)
HCT: 39 % (ref 36.0–46.0)
Hemoglobin: 13.2 g/dL (ref 12.0–15.0)
Lymphocytes Relative: 14 % (ref 12.0–46.0)
Lymphs Abs: 1.2 10*3/uL (ref 0.7–4.0)
MCHC: 33.7 g/dL (ref 30.0–36.0)
MCV: 93.7 fl (ref 78.0–100.0)
Monocytes Absolute: 0.6 10*3/uL (ref 0.1–1.0)
Monocytes Relative: 7.2 % (ref 3.0–12.0)
Neutro Abs: 6.3 10*3/uL (ref 1.4–7.7)
Neutrophils Relative %: 75.4 % (ref 43.0–77.0)
Platelets: 150 10*3/uL (ref 150.0–400.0)
RBC: 4.16 Mil/uL (ref 3.87–5.11)
RDW: 13.8 % (ref 11.5–15.5)
WBC: 8.3 10*3/uL (ref 4.0–10.5)

## 2020-05-25 LAB — HEMOGLOBIN A1C: Hgb A1c MFr Bld: 5.3 % (ref 4.6–6.5)

## 2020-05-25 MED ORDER — GABAPENTIN 100 MG PO CAPS: ORAL_CAPSULE | ORAL | 1 refills | Status: DC

## 2020-05-25 MED ORDER — LORAZEPAM 1 MG PO TABS: ORAL_TABLET | ORAL | 0 refills | Status: DC

## 2020-05-25 MED ORDER — GABAPENTIN 300 MG PO CAPS: 300.0000 mg | ORAL_CAPSULE | Freq: Three times a day (TID) | ORAL | 3 refills | Status: DC

## 2020-05-25 MED ORDER — GABAPENTIN 300 MG PO CAPS
300.0000 mg | ORAL_CAPSULE | Freq: Three times a day (TID) | ORAL | 3 refills | Status: DC
Start: 2020-05-25 — End: 2020-05-25

## 2020-05-25 MED ORDER — MIRABEGRON ER 25 MG PO TB24: 25.0000 mg | ORAL_TABLET | Freq: Every day | ORAL | 5 refills | Status: DC

## 2020-05-25 NOTE — Assessment & Plan Note (Addendum)
Chronic H/o bladder tacking Significant incontinence Start mirabegron 25 mg daily - advised this may or may not help Discussed seeing urology - she can consider seeing them

## 2020-05-26 DIAGNOSIS — R350 Frequency of micturition: Secondary | ICD-10-CM | POA: Insufficient documentation

## 2020-05-26 NOTE — Assessment & Plan Note (Signed)
Chronic Fairly controlled, stable Her chronic pain is contributing Continue current dose of medication

## 2020-05-26 NOTE — Assessment & Plan Note (Signed)
Chronic Controlled, stable Continue current dose of medication  

## 2020-05-26 NOTE — Assessment & Plan Note (Signed)
Has frequency and incontinence Both sound chronic Will r/o uti UA, Ucx

## 2020-05-26 NOTE — Assessment & Plan Note (Signed)
Chronic GERD controlled Continue daily medication  

## 2020-05-26 NOTE — Assessment & Plan Note (Signed)
Chronic Check a1c Low sugar / carb diet Encouraged increased activity

## 2020-05-26 NOTE — Assessment & Plan Note (Signed)
Chronic BP controlled Current regimen effective and well tolerated Continue current medications at current doses cmp  

## 2020-05-27 ENCOUNTER — Encounter: Payer: Self-pay | Admitting: Internal Medicine

## 2020-05-27 LAB — URINE CULTURE

## 2020-05-28 ENCOUNTER — Encounter: Payer: Self-pay | Admitting: Internal Medicine

## 2020-05-28 ENCOUNTER — Other Ambulatory Visit: Payer: Self-pay | Admitting: Internal Medicine

## 2020-05-28 MED ORDER — CEPHALEXIN 500 MG PO CAPS
500.0000 mg | ORAL_CAPSULE | Freq: Two times a day (BID) | ORAL | 0 refills | Status: DC
Start: 2020-05-28 — End: 2020-07-18

## 2020-05-29 ENCOUNTER — Encounter: Payer: Self-pay | Admitting: Internal Medicine

## 2020-05-30 ENCOUNTER — Encounter: Payer: Self-pay | Admitting: Internal Medicine

## 2020-05-30 MED ORDER — OXYBUTYNIN CHLORIDE ER 10 MG PO TB24
10.0000 mg | ORAL_TABLET | Freq: Every day | ORAL | 5 refills | Status: DC
Start: 2020-05-30 — End: 2020-07-18

## 2020-05-31 ENCOUNTER — Ambulatory Visit: Payer: Medicare Other

## 2020-06-05 ENCOUNTER — Other Ambulatory Visit: Payer: Self-pay

## 2020-06-05 ENCOUNTER — Ambulatory Visit (INDEPENDENT_AMBULATORY_CARE_PROVIDER_SITE_OTHER): Payer: Medicare Other

## 2020-06-05 DIAGNOSIS — J4550 Severe persistent asthma, uncomplicated: Secondary | ICD-10-CM | POA: Diagnosis not present

## 2020-06-05 DIAGNOSIS — J45998 Other asthma: Secondary | ICD-10-CM

## 2020-06-05 MED ORDER — MEPOLIZUMAB 100 MG ~~LOC~~ SOLR
100.0000 mg | Freq: Once | SUBCUTANEOUS | Status: AC
  Administered 2020-06-05: 100 mg via SUBCUTANEOUS

## 2020-06-05 NOTE — Progress Notes (Signed)
Have you been hospitalized within the last 10 days?  No Do you have a fever?  No Do you have a cough?  No Do you have a headache or sore throat? No Do you have your Epi Pen visible and is it within date?  Yes 

## 2020-06-06 ENCOUNTER — Telehealth: Payer: Self-pay | Admitting: Internal Medicine

## 2020-06-06 NOTE — Telephone Encounter (Signed)
New message:   [facility] is calling and needing to clarify meloxicam (MOBIC) 7.5 MG tablet for the pt. Please advise.  Invoice # [id number]

## 2020-06-07 ENCOUNTER — Other Ambulatory Visit: Payer: Self-pay

## 2020-06-07 MED ORDER — MELOXICAM 7.5 MG PO TABS: ORAL_TABLET | ORAL | 1 refills | Status: DC

## 2020-06-07 MED ORDER — CETIRIZINE HCL 10 MG PO CAPS: 10.0000 mg | ORAL_CAPSULE | Freq: Every day | ORAL | 3 refills | Status: DC

## 2020-06-07 NOTE — Telephone Encounter (Signed)
    1.Medication Requested:meloxicam (MOBIC) 7.5 MG tablet  2. [facility] (Name[facility] Street[facility]  3. On Med List: yes  4. Last Visit with PCP: 05/25/20  5. Next visit date with PCP:   Agent: Please be advised that RX refills may take up to 3 business days. We ask that you follow-up with your pharmacy.

## 2020-06-08 ENCOUNTER — Telehealth: Payer: Self-pay

## 2020-06-08 MED ORDER — GABAPENTIN 100 MG PO CAPS: ORAL_CAPSULE | ORAL | 1 refills | Status: DC

## 2020-06-08 NOTE — Telephone Encounter (Signed)
Erx has been sent as requested  

## 2020-06-08 NOTE — Telephone Encounter (Signed)
1.Medication Requested:gabapentin (NEURONTIN) 100 MG capsule  2. Pharmacy (Name, Street, City):[facility] [place] [place]  3. On Med List: Yes   4. Last Visit with PCP: 5.28.21   5. Next visit date with PCP:11.29.21    Agent: Please be advised that RX refills may take up to 3 business days. We ask that you follow-up with your pharmacy.

## 2020-06-11 ENCOUNTER — Other Ambulatory Visit: Payer: Self-pay

## 2020-06-11 MED ORDER — PREDNISONE 5 MG PO TABS: 5.0000 mg | ORAL_TABLET | Freq: Every day | ORAL | 3 refills | Status: DC

## 2020-06-25 ENCOUNTER — Telehealth: Payer: Self-pay | Admitting: Pulmonary Disease

## 2020-06-25 NOTE — Telephone Encounter (Signed)
Nucala Order: °100mg #1 Vial °Order Date: 06/25/2020 °Expected date of arrival: 06/26/2020 °Ordered by: Dhriti Fales, CMA  °Specialty Pharmacy: [facility]  °

## 2020-06-26 NOTE — Telephone Encounter (Signed)
Nucala Shipment Received: 100mg #1 vial Medication arrival date: 06/26/2020 Lot #: [id number] Exp date: 09/2023 Received by: Jaymz Traywick, CMA   

## 2020-07-03 ENCOUNTER — Ambulatory Visit (INDEPENDENT_AMBULATORY_CARE_PROVIDER_SITE_OTHER): Payer: Medicare Other

## 2020-07-03 ENCOUNTER — Other Ambulatory Visit: Payer: Self-pay

## 2020-07-03 DIAGNOSIS — J45998 Other asthma: Secondary | ICD-10-CM

## 2020-07-03 MED ORDER — MEPOLIZUMAB 100 MG ~~LOC~~ SOLR
100.0000 mg | Freq: Once | SUBCUTANEOUS | Status: AC
  Administered 2020-07-03: 100 mg via SUBCUTANEOUS

## 2020-07-03 NOTE — Progress Notes (Signed)
Have you been hospitalized within the last 10 days?  No Do you have a fever?  No Do you have a cough?  No Do you have a headache or sore throat? No  

## 2020-07-18 ENCOUNTER — Other Ambulatory Visit: Payer: Self-pay

## 2020-07-18 ENCOUNTER — Ambulatory Visit (INDEPENDENT_AMBULATORY_CARE_PROVIDER_SITE_OTHER): Payer: Medicare Other | Admitting: Pulmonary Disease

## 2020-07-18 ENCOUNTER — Encounter: Payer: Self-pay | Admitting: Pulmonary Disease

## 2020-07-18 VITALS — BP 128/68 | HR 70 | Temp 97.9°F | Ht 60.0 in | Wt 153.2 lb

## 2020-07-18 DIAGNOSIS — Z7189 Other specified counseling: Secondary | ICD-10-CM

## 2020-07-18 DIAGNOSIS — J4550 Severe persistent asthma, uncomplicated: Secondary | ICD-10-CM | POA: Diagnosis not present

## 2020-07-18 DIAGNOSIS — J8289 Other pulmonary eosinophilia, not elsewhere classified: Secondary | ICD-10-CM

## 2020-07-18 DIAGNOSIS — E2749 Other adrenocortical insufficiency: Secondary | ICD-10-CM

## 2020-07-18 NOTE — Patient Instructions (Signed)
Follow up in 6 months 

## 2020-07-18 NOTE — Progress Notes (Signed)
[facility]  Chief Complaint  Patient presents with  . Follow-up    Constitutional:  BP 128/68 (BP Location: Right Arm, Cuff Size: Normal)   Pulse 70   Temp 97.9 F (36.6 C) (Oral)   Ht 5' (1.524 m)   Wt 153 lb 3.2 oz (69.5 kg)   SpO2 97% Comment: RA  BMI 29.92 kg/m   Past Medical History:  Anemia, Anxiety, Arthritis, C2 fx, Cataract, Chronic headaches, Depression, GERD, Gout, Hiatal hernia, HTN, Osteoporosis, Urinary incontinence  Brief Summary:  Lisa Larson is a 79 y.o. 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  Subjective:  She had allergies in the Spring.  Better now.  Hasn't been using asmanex.  Not having cough, wheeze, sputum, fever, skin rash, sinus congestion.  Not needing albuterol much.  She doesn't believe COVID is real and doesn't think the vaccine would help and in fact might do more harm than good.  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 - has been on nucala since March 2019 - don't think she needs regular ICS therapy at this time; d/c'ed asmanex from med list - prn albuterol  Allergic rhinitis. - prn flonase  Osteoarthritis. - f/u with PCP and rheumatology (Dr. 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 - followed by Dr. Kelton Pillar with endocrinology - maintained on 5 mg prednisone daily; likely helping with asthma  COVID advice. - discussed that COVID is a real infection, and discussed pros/cons of COVID vaccines - she will think about this, but  likely will not get COVID vaccine  A total of  24 minutes spent addressing patient care issues on day of visit.   Follow up:   Patient Instructions  Follow up in 6 months   Signature:  [name], MD [place] Pager: [phone] 07/18/2020, 12:10 PM  Flow Sheet     Pulmonary tests:   CBC 01/14/11>>39% eosinophils  01/14/11>>HIV negative, ACE 39, RF negative, ANA 1:40, ANCA negative   BAL 01/21/11>>48% Eosinophils   IgE 02/25/11>>282  PFT 11/04/12>>FEV1 1.77 (87%), FEV1% 68, TLC 4.87 (101%), DLCO 69%, +BD from FEF 25-75%.  RAST 10/26/17 >> negative, IgE 108  Spirometry 08/09/19 >> FEV1 1.08 (64%), FEV1% 60  Chest imaging;   CT chest 01/16/11 >> Multifocal b/l ASD with GGO  CT chest 07/22/11 >> resolution of ASD  CT chest 10/03/11 >> recurrence of nodular infiltrate Rt upper lobe  Medications:   Allergies as of 07/18/2020      Reactions   Other Rash, Shortness Of Breath   Sulfonamide Derivatives Shortness Of Breath, Rash   Oxycodone-aspirin Other (See Comments)   Couldn't hear    Diclofenac Other (See Comments)   Unknown reaction    Prolia [denosumab]    Muscle pain, bone pain   Rofecoxib Other (See Comments)   Unknown reaction    Ace Inhibitors Cough   Benazepril Hcl Other (See Comments)   No PMH of angioedema; ACE-I caused cough   Tramadol Itching      Medication List  Accurate as of July 18, 2020 12:10 PM. If you have any questions, ask your nurse or doctor.        STOP taking these medications   Asmanex (60 Metered Doses) 220 MCG/INH inhaler Generic drug: mometasone Stopped by: [name], MD   cephALEXin 500 MG capsule Commonly known as: KEFLEX Stopped by: [name], MD   oxybutynin 10 MG 24 hr tablet Commonly known as: Ditropan XL Stopped by: [name], MD     TAKE these medications   acetaminophen 500 MG tablet Commonly known as: TYLENOL Take 1,000 mg by mouth 3 (three) times daily as needed.    albuterol 108 (90 Base) MCG/ACT inhaler Commonly known as: ProAir HFA Inhale 2 puffs into the lungs every 6 (six) hours as needed for wheezing or shortness of breath.   calcium carbonate 600 MG tablet Commonly known as: OS-CAL Take 600 mg by mouth 2 (two) times daily.   Cetirizine HCl 10 MG Caps Take 1 capsule (10 mg total) by mouth daily.   famotidine 40 MG tablet Commonly known as: PEPCID Take 1 tablet (40 mg total) by mouth daily.   fluticasone 50 MCG/ACT nasal spray Commonly known as: FLONASE Place 2 sprays into both nostrils daily.   gabapentin 300 MG capsule Commonly known as: NEURONTIN Take 1 capsule (300 mg total) by mouth 3 (three) times daily.   gabapentin 100 MG capsule Commonly known as: NEURONTIN TAKE 1 CAPSULE(100 MG) BY MOUTH AT BEDTIME   hydrOXYzine 25 MG tablet Commonly known as: ATARAX/VISTARIL TAKE 1 TABLET(25 MG) BY MOUTH AT BEDTIME AS NEEDED FOR SLEEP   LORazepam 1 MG tablet Commonly known as: ATIVAN TAKE 1/2 TABLET BY MOUTH EVERY 8- 12 HOURS AS NEEDED   losartan 100 MG tablet Commonly known as: COZAAR TAKE 1 TABLET(100 MG) BY MOUTH DAILY   meloxicam 7.5 MG tablet Commonly known as: MOBIC TAKE 1 TABLET(7.5 MG) BY MOUTH DAILY   nystatin-triamcinolone ointment Commonly known as: MYCOLOG Apply 1 application topically 2 (two) times daily.   predniSONE 5 MG tablet Commonly known as: DELTASONE Take 1 tablet (5 mg total) by mouth daily with breakfast.   sertraline 100 MG tablet Commonly known as: ZOLOFT TAKE 2 TABLETS(200 MG) BY MOUTH DAILY   thiamine 100 MG tablet Commonly known as: Vitamin B-1 Take 100 mg by mouth daily as needed.   Vitamin D3 50 MCG (2000 UT) capsule Take 2,000 Units by mouth daily.       Past Surgical History:  She  has a past surgical history that includes Shoulder surgery (Left, 08-2008); Total abdominal hysterectomy; Tonsillectomy and adenoidectomy; colonoscopy with polypectomy (06/2013); Nasal sinus surgery;  Appendectomy; Esophageal dilation; Bronchoscopy (12-2010); Cataract extraction w/ intraocular lens  implant, bilateral (Bilateral); Knee arthroscopy (Right, 06/18/2015); Tonsillectomy; Cholecystectomy (N/A, 05/23/2016); Upper gastrointestinal endoscopy; Colonoscopy; Polypectomy; and Adenoidectomy.  Family History:  Her family history includes Alzheimer's disease in her mother; Arthritis in her father; Cancer in her son; Colitis in her mother; Diabetes in her brother and paternal grandmother; Heart attack in her paternal grandmother; Hypertension in her brother, father, and mother; Irritable bowel syndrome in her mother; Non-Hodgkin's lymphoma in her brother; Other in her son; Pulmonary embolism in her father; Rheum arthritis in her father.  Social History:  She  reports that she quit smoking about 51 years ago. Her smoking use included cigarettes. She has a 15.00 pack-year smoking history. She has never used smokeless tobacco. She reports that she does not drink alcohol and does not use drugs.

## 2020-07-31 ENCOUNTER — Ambulatory Visit (INDEPENDENT_AMBULATORY_CARE_PROVIDER_SITE_OTHER): Payer: Medicare Other

## 2020-07-31 ENCOUNTER — Other Ambulatory Visit: Payer: Self-pay

## 2020-07-31 DIAGNOSIS — J4550 Severe persistent asthma, uncomplicated: Secondary | ICD-10-CM | POA: Diagnosis not present

## 2020-07-31 MED ORDER — MEPOLIZUMAB 100 MG ~~LOC~~ SOLR
100.0000 mg | SUBCUTANEOUS | Status: DC
  Administered 2020-07-31: 100 mg via SUBCUTANEOUS

## 2020-08-08 ENCOUNTER — Encounter: Payer: Self-pay | Admitting: Internal Medicine

## 2020-08-09 ENCOUNTER — Other Ambulatory Visit: Payer: Self-pay | Admitting: Internal Medicine

## 2020-08-11 ENCOUNTER — Other Ambulatory Visit: Payer: Self-pay | Admitting: Internal Medicine

## 2020-08-17 ENCOUNTER — Ambulatory Visit (INDEPENDENT_AMBULATORY_CARE_PROVIDER_SITE_OTHER): Payer: Medicare Other

## 2020-08-17 ENCOUNTER — Ambulatory Visit (INDEPENDENT_AMBULATORY_CARE_PROVIDER_SITE_OTHER): Payer: Medicare Other | Admitting: Family Medicine

## 2020-08-17 ENCOUNTER — Encounter: Payer: Self-pay | Admitting: Family Medicine

## 2020-08-17 ENCOUNTER — Other Ambulatory Visit: Payer: Self-pay

## 2020-08-17 ENCOUNTER — Ambulatory Visit: Payer: Self-pay

## 2020-08-17 DIAGNOSIS — R1031 Right lower quadrant pain: Secondary | ICD-10-CM | POA: Diagnosis not present

## 2020-08-17 DIAGNOSIS — M1711 Unilateral primary osteoarthritis, right knee: Secondary | ICD-10-CM

## 2020-08-17 DIAGNOSIS — M1712 Unilateral primary osteoarthritis, left knee: Secondary | ICD-10-CM
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
## 2020-08-22 ENCOUNTER — Other Ambulatory Visit: Payer: Medicare Other

## 2020-08-22 ENCOUNTER — Telehealth: Payer: Self-pay | Admitting: Pulmonary Disease

## 2020-08-22 NOTE — Telephone Encounter (Signed)
Form has been completed, Copy sent to scan.   Patient informed and will pick up original.

## 2020-08-22 NOTE — Telephone Encounter (Signed)
Nucala Shipment Received: °100mg #1 vial °Medication arrival date: 08/22/20 °Lot #: [id number] °Exp date: 02/26/2024 °Received by: Raynetta Osterloh,LPN °

## 2020-08-24 ENCOUNTER — Other Ambulatory Visit: Payer: Self-pay | Admitting: Family Medicine

## 2020-08-24 DIAGNOSIS — R1031 Right lower quadrant pain: Secondary | ICD-10-CM

## 2020-08-27 ENCOUNTER — Ambulatory Visit
Admission: RE | Admit: 2020-08-27 | Discharge: 2020-08-27 | Disposition: A | Discharge status: Home / Self Care | Payer: Medicare Other | Source: Ambulatory Visit | Attending: Family Medicine | Admitting: Family Medicine

## 2020-08-27 ENCOUNTER — Other Ambulatory Visit: Payer: Medicare Other

## 2020-08-27 DIAGNOSIS — R1031 Right lower quadrant pain: Secondary | ICD-10-CM

## 2020-08-28 ENCOUNTER — Telehealth: Payer: Self-pay | Admitting: Family Medicine

## 2020-08-28 DIAGNOSIS — R1031 Right lower quadrant pain: Secondary | ICD-10-CM

## 2020-08-28 NOTE — Telephone Encounter (Signed)
Abdominal ultrasound did not show a hernia.  Could contemplate CT scan to further evaluate if you'd like.

## 2020-08-28 NOTE — Addendum Note (Signed)
Addended by: Hortencia Pilar on: 08/28/2020 09:43 AM   Modules accepted: Orders

## 2020-08-29 ENCOUNTER — Other Ambulatory Visit: Payer: Self-pay

## 2020-08-29 ENCOUNTER — Ambulatory Visit (INDEPENDENT_AMBULATORY_CARE_PROVIDER_SITE_OTHER): Payer: Medicare Other

## 2020-08-29 DIAGNOSIS — J4550 Severe persistent asthma, uncomplicated: Secondary | ICD-10-CM

## 2020-08-29 MED ORDER — MEPOLIZUMAB 100 MG ~~LOC~~ SOLR
100.0000 mg | Freq: Once | SUBCUTANEOUS | Status: AC
  Administered 2020-08-29: 100 mg via SUBCUTANEOUS

## 2020-08-29 NOTE — Progress Notes (Signed)
Have you been hospitalized within the last 10 days?  No Do you have a fever?  No Do you have a cough?  No Do you have a headache or sore throat? No Do you have your Epi Pen visible and is it within date?  Yes 

## 2020-09-06 ENCOUNTER — Encounter: Payer: Self-pay | Admitting: Family Medicine

## 2020-09-10 ENCOUNTER — Encounter: Payer: Self-pay | Admitting: Family Medicine

## 2020-09-11 ENCOUNTER — Ambulatory Visit
Admission: RE | Admit: 2020-09-11 | Discharge: 2020-09-11 | Disposition: A | Discharge status: Home / Self Care | Payer: Medicare Other | Source: Ambulatory Visit | Attending: Family Medicine | Admitting: Family Medicine

## 2020-09-11 ENCOUNTER — Other Ambulatory Visit: Payer: Self-pay

## 2020-09-11 DIAGNOSIS — R1031 Right lower quadrant pain: Secondary | ICD-10-CM | POA: Diagnosis not present

## 2020-09-11 DIAGNOSIS — M4856XA Collapsed vertebra, not elsewhere classified, lumbar region, initial encounter for fracture: Secondary | ICD-10-CM | POA: Diagnosis not present

## 2020-09-11 DIAGNOSIS — I700 Atherosclerosis of aorta: Secondary | ICD-10-CM | POA: Diagnosis not present

## 2020-09-11 DIAGNOSIS — M47816 Spondylosis without myelopathy or radiculopathy, lumbar region: Secondary | ICD-10-CM | POA: Diagnosis not present

## 2020-09-11 MED ORDER — IOPAMIDOL (ISOVUE-300) INJECTION 61%
100.0000 mL | Freq: Once | INTRAVENOUS | Status: AC | PRN
  Administered 2020-09-11: 100 mL via INTRAVENOUS

## 2020-09-12 ENCOUNTER — Telehealth: Payer: Self-pay | Admitting: Family Medicine

## 2020-09-12 NOTE — Telephone Encounter (Signed)
Abdominal CT scan is unremarkable, no explanation for the pain.

## 2020-09-17 ENCOUNTER — Telehealth: Payer: Self-pay | Admitting: Pulmonary Disease

## 2020-09-17 NOTE — Telephone Encounter (Signed)
Nucala Order: 100mg #1 Vial Order Date: 09/17/20 Expected date of arrival: 09/18/20 Ordered by: Sidni Fusco,LPN [facility]: [facility] 

## 2020-09-19 ENCOUNTER — Telehealth: Payer: Self-pay | Admitting: Pulmonary Disease

## 2020-09-19 NOTE — Telephone Encounter (Signed)
Closing because another message is open for same message.

## 2020-09-19 NOTE — Telephone Encounter (Signed)
Called and spoke with Patient. Patient scheduled 09/28/20 at 1030 for nucala  injection.

## 2020-09-24 NOTE — Telephone Encounter (Signed)
Nucala Shipment Received: °100mg #1 vial °Medication arrival date: 09/24/20 °Lot #: [id number] °Exp date: 01/29/2024 °Received by: Kasarah Sitts,LPN °

## 2020-09-26 ENCOUNTER — Ambulatory Visit: Payer: Medicare Other

## 2020-09-28 ENCOUNTER — Other Ambulatory Visit: Payer: Self-pay

## 2020-09-28 ENCOUNTER — Ambulatory Visit (INDEPENDENT_AMBULATORY_CARE_PROVIDER_SITE_OTHER): Payer: Medicare Other

## 2020-09-28 DIAGNOSIS — J4550 Severe persistent asthma, uncomplicated: Secondary | ICD-10-CM | POA: Diagnosis not present

## 2020-09-28 MED ORDER — MEPOLIZUMAB 100 MG ~~LOC~~ SOLR
100.0000 mg | Freq: Once | SUBCUTANEOUS | Status: AC
  Administered 2020-09-28: 100 mg via SUBCUTANEOUS

## 2020-09-28 NOTE — Progress Notes (Signed)
Have you been hospitalized within the last 10 days?  No Do you have a fever?  No Do you have a cough?  No Do you have a headache or sore throat? No Do you have your Epi Pen visible and is it within date?  Yes 

## 2020-10-22 ENCOUNTER — Telehealth: Payer: Self-pay | Admitting: Pulmonary Disease

## 2020-10-22 ENCOUNTER — Other Ambulatory Visit: Payer: Self-pay | Admitting: Internal Medicine

## 2020-10-22 NOTE — Telephone Encounter (Signed)
Nucala Order: 100mg  #1 Vial Order Date: 10/22/20 Expected date of arrival: 10/23/20 Ordered by: Caroline: Nigel Mormon

## 2020-10-23 NOTE — Telephone Encounter (Signed)
Nucala Shipment Received: 100mg  #1 vial Medication arrival date: 10/23/20 Lot #: [id number] Exp date: 02/26/2024 Received by: Elliot Dally

## 2020-10-29 ENCOUNTER — Other Ambulatory Visit: Payer: Self-pay

## 2020-10-29 ENCOUNTER — Ambulatory Visit (INDEPENDENT_AMBULATORY_CARE_PROVIDER_SITE_OTHER): Payer: Medicare Other

## 2020-10-29 DIAGNOSIS — J4550 Severe persistent asthma, uncomplicated: Secondary | ICD-10-CM

## 2020-10-29 MED ORDER — MEPOLIZUMAB 100 MG ~~LOC~~ SOLR
100.0000 mg | Freq: Once | SUBCUTANEOUS | Status: AC
  Administered 2020-10-29: 100 mg via SUBCUTANEOUS

## 2020-10-29 NOTE — Progress Notes (Signed)
Have you been hospitalized within the last 10 days?  No Do you have a fever?  No Do you have a cough?  No Do you have a headache or sore throat? No Do you have your Epi Pen visible and is it within date?  Yes 

## 2020-10-31 ENCOUNTER — Ambulatory Visit: Payer: Medicare Other | Admitting: Pulmonary Disease
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
## 2020-11-26 ENCOUNTER — Other Ambulatory Visit: Payer: Self-pay

## 2020-11-26 ENCOUNTER — Ambulatory Visit: Payer: Medicare Other

## 2020-11-26 ENCOUNTER — Encounter: Payer: Self-pay | Admitting: Internal Medicine

## 2020-11-26 ENCOUNTER — Ambulatory Visit (INDEPENDENT_AMBULATORY_CARE_PROVIDER_SITE_OTHER): Payer: Medicare Other | Admitting: Internal Medicine

## 2020-11-26 ENCOUNTER — Telehealth: Payer: Self-pay | Admitting: Internal Medicine

## 2020-11-26 DIAGNOSIS — R739 Hyperglycemia, unspecified: Secondary | ICD-10-CM | POA: Diagnosis not present

## 2020-11-26 DIAGNOSIS — F411 Generalized anxiety disorder: Secondary | ICD-10-CM

## 2020-11-26 DIAGNOSIS — M1990 Unspecified osteoarthritis, unspecified site: Secondary | ICD-10-CM | POA: Diagnosis not present

## 2020-11-26 DIAGNOSIS — N39498 Other specified urinary incontinence: Secondary | ICD-10-CM

## 2020-11-26 DIAGNOSIS — F419 Anxiety disorder, unspecified: Secondary | ICD-10-CM | POA: Diagnosis not present

## 2020-11-26 DIAGNOSIS — E2749 Other adrenocortical insufficiency: Secondary | ICD-10-CM

## 2020-11-26 DIAGNOSIS — K219 Gastro-esophageal reflux disease without esophagitis: Secondary | ICD-10-CM

## 2020-11-26 DIAGNOSIS — I10 Essential (primary) hypertension: Secondary | ICD-10-CM

## 2020-11-26 DIAGNOSIS — F3289 Other specified depressive episodes: Secondary | ICD-10-CM

## 2020-11-26 MED ORDER — LORAZEPAM 1 MG PO TABS: ORAL_TABLET | ORAL | 0 refills | Status: DC

## 2020-11-26 MED ORDER — FAMOTIDINE 40 MG PO TABS
40.0000 mg | ORAL_TABLET | Freq: Every day | ORAL | 1 refills | Status: DC | PRN
Start: 2020-11-26 — End: 2022-03-31

## 2020-11-26 NOTE — Assessment & Plan Note (Signed)
Chronic Controlled, stable Continue sertraline 200 mg daily  

## 2020-11-26 NOTE — Progress Notes (Signed)
Virtual Visit via Video Note  I connected with Lisa Larson on 11/26/20 at  1:00 PM EST by a video enabled telemedicine application and verified that I am speaking with the correct person using two identifiers.   I discussed the limitations of evaluation and management by telemedicine and the availability of in person appointments. The patient expressed understanding and agreed to proceed.  Present for the visit:  Myself, Dr Billey Gosling, [name] and her daughter and son were also present.  The patient is currently at home and I am in the office.    No referring provider.    History of Present Illness: The patient is here for follow up of their chronic medical problems, including htn, hyperglycemia, GERD, depression, anxiety, generalized OA, chronic back, knee pain, urinary leakage   She is very sedentary due to her OA.  It is harder and harder for her to get up and move around and come to appointments.   Hip pain is bad - she wondered if one of her medications was causing the problem.  They wondered about her asthma infusion that she gets in the meloxicam.  They look them up and both have the possibility of causing joint pain.  They were wondering about stopping both of them.  She has seen orthopedics, but states she did not talk to him about her joint pain.  BP controlled at home.    Review of Systems  Constitutional: Negative for fever.  Respiratory: Negative for cough, shortness of breath and wheezing.   Cardiovascular: Negative for chest pain, palpitations and leg swelling.  Musculoskeletal: Positive for joint pain and myalgias.  Neurological: Negative for dizziness and headaches.      Social History   Socioeconomic History  . Marital status: Widowed    Spouse name: Not on file  . Number of children: 2  . Years of education: Not on file  . Highest education level: Not on file  Occupational History  . Occupation: Retired, Production designer, theatre/television/film work  Tobacco Use   . Smoking status: Former Smoker    Packs/day: 1.00    Years: 15.00    Pack years: 15.00    Types: Cigarettes    Quit date: 12/29/1968    Years since quitting: 51.9  . Smokeless tobacco: Never Used  . Tobacco comment: smoked 1966- ? 1970, up to 1 ppd  Vaping Use  . Vaping Use: Never used  Substance and Sexual Activity  . Alcohol use: No    Comment: h/o of alcohol abuse  . Drug use: No  . Sexual activity: Yes    Birth control/protection: Surgical  Other Topics Concern  . Not on file  Social History Narrative   Lives alone.  Has a son and a daughter who help with her care.  Ambulates with a cane.   Social Determinants of Health   Financial Resource Strain:   . Difficulty of Paying Living Expenses: Not on file  Food Insecurity:   . Worried About Charity fundraiser in the Last Year: Not on file  . Ran Out of Food in the Last Year: Not on file  Transportation Needs:   . Lack of Transportation (Medical): Not on file  . Lack of Transportation (Non-Medical): Not on file  Physical Activity:   . Days of Exercise per Week: Not on file  . Minutes of Exercise per Session: Not on file  Stress:   . Feeling of Stress : Not on file  Social Connections:   .  Frequency of Communication with Friends and Family: Not on file  . Frequency of Social Gatherings with Friends and Family: Not on file  . Attends Religious Services: Not on file  . Active Member of Clubs or Organizations: Not on file  . Attends Archivist Meetings: Not on file  . Marital Status: Not on file     Observations/Objective: Appears well in NAD Breathing normally Skin appears warm and dry Mood and affect normal  Assessment and Plan:  See Problem List for Assessment and Plan of chronic medical problems.   Follow Up Instructions:    I discussed the assessment and treatment plan with the patient. The patient was provided an opportunity to ask questions and all were answered. The patient agreed with the  plan and demonstrated an understanding of the instructions.   The patient was advised to call back or seek an in-person evaluation if the symptoms worsen or if the condition fails to improve as anticipated.    Binnie Rail, MD

## 2020-11-26 NOTE — Assessment & Plan Note (Signed)
Chronic Following with endocrine Maintained on 5 mg of prednisone daily

## 2020-11-26 NOTE — Assessment & Plan Note (Signed)
Chronic Lab Results  Component Value Date   HGBA1C 5.3 05/25/2020   Will recheck A1c at her next visit

## 2020-11-26 NOTE — Assessment & Plan Note (Addendum)
Chronic Controlled, stable Continue sertraline 200 mg daily and Ativan 1 mg-1/4-1/2 tablet daily as needed, which was refilled today

## 2020-11-26 NOTE — Assessment & Plan Note (Signed)
Chronic GERD controlled Continue Pepcid 40 mg daily as needed

## 2020-11-26 NOTE — Telephone Encounter (Signed)
Error

## 2020-11-26 NOTE — Assessment & Plan Note (Signed)
Chronic, diffuse This inhibits her significantly in her mobility.  She has difficulty getting out to appointments Current her family wonder if some of her medications are possibly causing some of her pain She plans on stopping the Nucala-advised that she discuss this with her pulmonologist Concerned whether the meloxicam is causing increased pain.  I advised that they can stop for a week or 2 to see if it helps, but I do not think this is the cause of her pain Encourage follow-up with orthopedics for further evaluation Will request a visit with her pharmacist to review all of her medications

## 2020-11-26 NOTE — Assessment & Plan Note (Signed)
Chronic Blood pressure well controlled at home per patient and her family We will continue to monitor Continue losartan 100 mg daily

## 2020-11-27 ENCOUNTER — Telehealth: Payer: Self-pay | Admitting: Internal Medicine

## 2020-11-27 ENCOUNTER — Ambulatory Visit (INDEPENDENT_AMBULATORY_CARE_PROVIDER_SITE_OTHER): Payer: Medicare Other

## 2020-11-27 ENCOUNTER — Other Ambulatory Visit: Payer: Self-pay

## 2020-11-27 VITALS — BP 144/102 | HR 74

## 2020-11-27 DIAGNOSIS — Z0000 Encounter for general adult medical examination without abnormal findings: Secondary | ICD-10-CM | POA: Diagnosis not present

## 2020-11-27 NOTE — Progress Notes (Signed)
  Chronic Care Management   Note  11/27/2020 Name: Lisa Larson MRN: [id number] DOB: [birth year]  Lisa Larson is a 79 y.o. year old female who is a primary care patient of Burns, Claudina Lick, MD. I reached out to [name] by phone today in response to a referral sent by Lisa Larson's PCP, Binnie Rail, MD.   Lisa Larson was given information about Chronic Care Management services today including:  1. CCM service includes personalized support from designated clinical staff supervised by her physician, including individualized plan of care and coordination with other care providers 2. 24/7 contact phone numbers for assistance for urgent and routine care needs. 3. Service will only be billed when office clinical staff spend 20 minutes or more in a month to coordinate care. 4. Only one practitioner may furnish and bill the service in a calendar month. 5. The patient may stop CCM services at any time (effective at the end of the month) by phone call to the office staff.   Patient wishes to consider information provided and/or speak with a member of the care team before deciding about enrollment in care management services.   Follow up plan:   Carley Perdue [employer] Scheduler
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
## 2020-11-27 NOTE — Patient Instructions (Signed)
Lisa Larson , Thank you for taking time to come for your Medicare Wellness Visit. I appreciate your ongoing commitment to your health goals. Please review the following plan we discussed and let me know if I can assist you in the future.   Screening recommendations/referrals: Colonoscopy: 06/29/2013; no repeat due to age Mammogram: 12/11/2016 Bone Density: 12/11/2016 Recommended yearly ophthalmology/optometry visit for glaucoma screening and checkup Recommended yearly dental visit for hygiene and checkup  Vaccinations: Influenza vaccine: 08/26/2019 Pneumococcal vaccine: up to date Tdap vaccine: 04/04/2013; due every 10 years Shingles vaccine: never done   Covid-19: never done  Advanced directives: Please bring a copy of your health care power of attorney and living will to the office at your convenience.  Conditions/risks identified: Yes; Reviewed health maintenance screenings with patient today and relevant education, vaccines, and/or referrals were provided. Please continue to do your personal lifestyle choices by: daily care of teeth and gums, regular physical activity (goal should be 5 days a week for 30 minutes), eat a healthy diet, avoid tobacco and drug use, limiting any alcohol intake, taking a low-dose aspirin (if not allergic or have been advised by your provider otherwise) and taking vitamins and minerals as recommended by your provider. Continue doing brain stimulating activities (puzzles, reading, adult coloring books, staying active) to keep memory sharp. Continue to eat heart healthy diet (full of fruits, vegetables, whole grains, lean protein, water--limit salt, fat, and sugar intake) and increase physical activity as tolerated.  Next appointment: Please schedule your next Medicare Wellness Visit with your Nurse Health Advisor in 1 year by calling [phone].   Preventive Care 24 Years and Older, Female Preventive care refers to lifestyle choices and visits with your health  care provider that can promote health and wellness. What does preventive care include?  A yearly physical exam. This is also called an annual well check.  Dental exams once or twice a year.  Routine eye exams. Ask your health care provider how often you should have your eyes checked.  Personal lifestyle choices, including:  Daily care of your teeth and gums.  Regular physical activity.  Eating a healthy diet.  Avoiding tobacco and drug use.  Limiting alcohol use.  Practicing safe sex.  Taking low-dose aspirin every day.  Taking vitamin and mineral supplements as recommended by your health care provider. What happens during an annual well check? The services and screenings done by your health care provider during your annual well check will depend on your age, overall health, lifestyle risk factors, and family history of disease. Counseling  Your health care provider may ask you questions about your:  Alcohol use.  Tobacco use.  Drug use.  Emotional well-being.  Home and relationship well-being.  Sexual activity.  Eating habits.  History of falls.  Memory and ability to understand (cognition).  Work and work Statistician.  Reproductive health. Screening  You may have the following tests or measurements:  Height, weight, and BMI.  Blood pressure.  Lipid and cholesterol levels. These may be checked every 5 years, or more frequently if you are over 74 years old.  Skin check.  Lung cancer screening. You may have this screening every year starting at age 38 if you have a 30-pack-year history of smoking and currently smoke or have quit within the past 15 years.  Fecal occult blood test (FOBT) of the stool. You may have this test every year starting at age 12.  Flexible sigmoidoscopy or colonoscopy. You may have a sigmoidoscopy every 5  years or a colonoscopy every 10 years starting at age 60.  Hepatitis C blood test.  Hepatitis B blood test.  Sexually  transmitted disease (STD) testing.  Diabetes screening. This is done by checking your blood sugar (glucose) after you have not eaten for a while (fasting). You may have this done every 1-3 years.  Bone density scan. This is done to screen for osteoporosis. You may have this done starting at age 82.  Mammogram. This may be done every 1-2 years. Talk to your health care provider about how often you should have regular mammograms. Talk with your health care provider about your test results, treatment options, and if necessary, the need for more tests. Vaccines  Your health care provider may recommend certain vaccines, such as:  Influenza vaccine. This is recommended every year.  Tetanus, diphtheria, and acellular pertussis (Tdap, Td) vaccine. You may need a Td booster every 10 years.  Zoster vaccine. You may need this after age 42.  Pneumococcal 13-valent conjugate (PCV13) vaccine. One dose is recommended after age 48.  Pneumococcal polysaccharide (PPSV23) vaccine. One dose is recommended after age 46. Talk to your health care provider about which screenings and vaccines you need and how often you need them. This information is not intended to replace advice given to you by your health care provider. Make sure you discuss any questions you have with your health care provider. Document Released: 01/11/2016 Document Revised: 09/03/2016 Document Reviewed: 10/16/2015 Elsevier Interactive Patient Education  2017 [employer] Prevention in the Home Falls can cause injuries. They can happen to people of all ages. There are many things you can do to make your home safe and to help prevent falls. What can I do on the outside of my home?  Regularly fix the edges of walkways and driveways and fix any cracks.  Remove anything that might make you trip as you walk through a door, such as a raised step or threshold.  Trim any bushes or trees on the path to your home.  Use bright outdoor  lighting.  Clear any walking paths of anything that might make someone trip, such as rocks or tools.  Regularly check to see if handrails are loose or broken. Make sure that both sides of any steps have handrails.  Any raised decks and porches should have guardrails on the edges.  Have any leaves, snow, or ice cleared regularly.  Use sand or salt on walking paths during winter.  Clean up any spills in your garage right away. This includes oil or grease spills. What can I do in the bathroom?  Use night lights.  Install grab bars by the toilet and in the tub and shower. Do not use towel bars as grab bars.  Use non-skid mats or decals in the tub or shower.  If you need to sit down in the shower, use a plastic, non-slip stool.  Keep the floor dry. Clean up any water that spills on the floor as soon as it happens.  Remove soap buildup in the tub or shower regularly.  Attach bath mats securely with double-sided non-slip rug tape.  Do not have throw rugs and other things on the floor that can make you trip. What can I do in the bedroom?  Use night lights.  Make sure that you have a light by your bed that is easy to reach.  Do not use any sheets or blankets that are too big for your bed. They should not hang  down onto the floor.  Have a firm chair that has side arms. You can use this for support while you get dressed.  Do not have throw rugs and other things on the floor that can make you trip. What can I do in the kitchen?  Clean up any spills right away.  Avoid walking on wet floors.  Keep items that you use a lot in easy-to-reach places.  If you need to reach something above you, use a strong step stool that has a grab bar.  Keep electrical cords out of the way.  Do not use floor polish or wax that makes floors slippery. If you must use wax, use non-skid floor wax.  Do not have throw rugs and other things on the floor that can make you trip. What can I do with my  stairs?  Do not leave any items on the stairs.  Make sure that there are handrails on both sides of the stairs and use them. Fix handrails that are broken or loose. Make sure that handrails are as long as the stairways.  Check any carpeting to make sure that it is firmly attached to the stairs. Fix any carpet that is loose or worn.  Avoid having throw rugs at the top or bottom of the stairs. If you do have throw rugs, attach them to the floor with carpet tape.  Make sure that you have a light switch at the top of the stairs and the bottom of the stairs. If you do not have them, ask someone to add them for you. What else can I do to help prevent falls?  Wear shoes that:  Do not have high heels.  Have rubber bottoms.  Are comfortable and fit you well.  Are closed at the toe. Do not wear sandals.  If you use a stepladder:  Make sure that it is fully opened. Do not climb a closed stepladder.  Make sure that both sides of the stepladder are locked into place.  Ask someone to hold it for you, if possible.  Clearly mark and make sure that you can see:  Any grab bars or handrails.  First and last steps.  Where the edge of each step is.  Use tools that help you move around (mobility aids) if they are needed. These include:  Canes.  Walkers.  Scooters.  Crutches.  Turn on the lights when you go into a dark area. Replace any light bulbs as soon as they burn out.  Set up your furniture so you have a clear path. Avoid moving your furniture around.  If any of your floors are uneven, fix them.  If there are any pets around you, be aware of where they are.  Review your medicines with your doctor. Some medicines can make you feel dizzy. This can increase your chance of falling. Ask your doctor what other things that you can do to help prevent falls. This information is not intended to replace advice given to you by your health care provider. Make sure you discuss any  questions you have with your health care provider. Document Released: 10/11/2009 Document Revised: 05/22/2016 Document Reviewed: 01/19/2015 Elsevier Interactive Patient Education  2017 [employer].

## 2020-12-03 ENCOUNTER — Other Ambulatory Visit: Payer: Self-pay | Admitting: Internal Medicine

## 2020-12-03 ENCOUNTER — Telehealth: Payer: Self-pay | Admitting: *Deleted

## 2020-12-03 MED ORDER — PREDNISONE 5 MG PO TABS
5.0000 mg | ORAL_TABLET | Freq: Every day | ORAL | 0 refills | Status: DC
Start: 2020-12-03 — End: 2021-01-28

## 2020-12-03 NOTE — Telephone Encounter (Signed)
Patient called re: Patient has not received her Mail Order RX for Prednisone and requests that a 30 day supply of predniSONE (DELTASONE) 5 MG tablet be sent to the alternative local PHARM listed below to last her until she receives her usual mailed RX:  [facility] [place], [place] - [address] Phone:  [phone]  Fax:  [phone]

## 2020-12-03 NOTE — Telephone Encounter (Signed)
[facility] called stating [facility] told her it is to soon for her prescription for predispose. Patient states she is out and [name] told her she would call in a few tablets for now. Please call patient

## 2020-12-03 NOTE — Telephone Encounter (Signed)
Sent a 30 days supply to [facility] as request. Patient stated that she was told by [facility] that Rx was mail out since 11/26.

## 2020-12-04 NOTE — Telephone Encounter (Signed)
Spoken to patient and she stated that she should be able to fill the Rx on 12/05/2020. However, if she have any problems then she will call back.

## 2020-12-07 ENCOUNTER — Telehealth: Payer: Self-pay | Admitting: Internal Medicine

## 2020-12-07 NOTE — Telephone Encounter (Signed)
Patient called re: Patient picked RX for Prednisone and only received 4 tablets instead of 30 tablets. Patient states it says quantity of 4 tablets on the bottle. /Partial refill before 12/03/2021. PHARM is  [id number] - [name], [place] - [address] Phone:  [phone]  Fax:  [phone]

## 2020-12-10 ENCOUNTER — Telehealth: Payer: Self-pay | Admitting: Pulmonary Disease

## 2020-12-11 NOTE — Telephone Encounter (Signed)
Spoken to patient and Rx for Prednisone from mail order finally received yesterday 12/10/2020

## 2020-12-11 NOTE — Telephone Encounter (Signed)
Called and spoke with Patient.  Patient scheduled 12/13/20 at 1130.  Nucala Order: 100mg  #1 Vial Order Date: 12/11/20 Expected date of arrival: 12/12/20 Ordered by: [place]: Nigel Mormon

## 2020-12-11 NOTE — Telephone Encounter (Signed)
Pt is calling back & can be reached at  [phone].  Explained it may take up to 3 days to return call.

## 2020-12-12 NOTE — Telephone Encounter (Signed)
Nucala Shipment Received: °100mg #1 vial °Medication arrival date: 12/12/20 °Lot #: [id number] °Exp date: 03/28/2024 °Received by: Chipper Koudelka,LPN °

## 2020-12-13 ENCOUNTER — Other Ambulatory Visit: Payer: Self-pay | Admitting: Internal Medicine

## 2020-12-13 ENCOUNTER — Ambulatory Visit (INDEPENDENT_AMBULATORY_CARE_PROVIDER_SITE_OTHER): Payer: Medicare Other

## 2020-12-13 ENCOUNTER — Telehealth: Payer: Self-pay | Admitting: Pulmonary Disease

## 2020-12-13 ENCOUNTER — Other Ambulatory Visit: Payer: Self-pay

## 2020-12-13 DIAGNOSIS — J4550 Severe persistent asthma, uncomplicated: Secondary | ICD-10-CM

## 2020-12-13 MED ORDER — EPINEPHRINE 0.3 MG/0.3ML IJ SOAJ: 0.3000 mg | Freq: Once | INTRAMUSCULAR | 11 refills | Status: AC

## 2020-12-13 MED ORDER — MEPOLIZUMAB 100 MG ~~LOC~~ SOLR
100.0000 mg | Freq: Once | SUBCUTANEOUS | Status: AC
  Administered 2020-12-13: 12:00:00 100 mg via SUBCUTANEOUS

## 2020-12-13 NOTE — Telephone Encounter (Signed)
Patient receives Nucala injections monthly and her current Epi pen is expired.  Patient requested a new Epi pen to be sent to [facility].  Patient stated Epi pen cost is to much for her to pay on her budget. Patient stated she only receives monthly social security.  Message routed to Pharmacy Team for assistance

## 2020-12-13 NOTE — Progress Notes (Signed)
Have you been hospitalized within the last 10 days?  No Do you have a fever?  No Do you have a cough?  No Do you have a headache or sore throat? No Do you have your Epi Pen visible and is it within date?  Yes  Epipen expires 12/28/20. Epipen prescription sent to requested [facility] pharmacy.

## 2020-12-14 NOTE — Telephone Encounter (Signed)
Spoke to [place] and they stated patient's copay for Epi-pen is $20.00. Called patient and she said she can afford. She will make arrangements to pick it up. Nothing further needed.

## 2021-01-07 ENCOUNTER — Ambulatory Visit: Payer: Medicare Other | Admitting: Pulmonary Disease

## 2021-01-07 ENCOUNTER — Telehealth: Payer: Self-pay | Admitting: Pulmonary Disease

## 2021-01-07 NOTE — Telephone Encounter (Signed)
Nucala Order: 100mg  #1 Vial Order Date: 01/07/21 Expected date of arrival: 01/08/21 Ordered by: [place]: Nigel Mormon

## 2021-01-08 NOTE — Telephone Encounter (Signed)
Nucala Shipment Received: 100mg #1 vial Medication arrival date: 01/08/21 Lot #: [id number] Exp date: 03/28/2024 Received by: Katessa Attridge,LPN 

## 2021-01-11 ENCOUNTER — Encounter: Payer: Self-pay | Admitting: *Deleted

## 2021-01-11 ENCOUNTER — Telehealth: Payer: Self-pay | Admitting: Pulmonary Disease

## 2021-01-11 NOTE — Telephone Encounter (Signed)
My chart message sent to Patient.  Patient schedule 01/17/21 at 11:45 for Nucala injection.

## 2021-01-14 ENCOUNTER — Ambulatory Visit: Payer: Medicare Other

## 2021-01-16 NOTE — Telephone Encounter (Signed)
Pt had to cancel appt due to weather and wants appt to be rescheduled

## 2021-01-17 ENCOUNTER — Ambulatory Visit: Payer: Medicare Other

## 2021-01-21 ENCOUNTER — Telehealth: Payer: Self-pay | Admitting: Pulmonary Disease

## 2021-01-21 ENCOUNTER — Ambulatory Visit: Payer: Medicare Other

## 2021-01-21 NOTE — Telephone Encounter (Signed)
Called and spoke with Patient.  Patient scheduled for Nucala injection 01/24/21 at 1045.

## 2021-01-22 ENCOUNTER — Encounter: Payer: Self-pay | Admitting: Pulmonary Disease

## 2021-01-22 ENCOUNTER — Telehealth: Payer: Self-pay | Admitting: Pulmonary Disease

## 2021-01-22 DIAGNOSIS — J4550 Severe persistent asthma, uncomplicated: Secondary | ICD-10-CM

## 2021-01-22 MED ORDER — ALBUTEROL SULFATE HFA 108 (90 BASE) MCG/ACT IN AERS: 2.0000 | INHALATION_SPRAY | Freq: Four times a day (QID) | RESPIRATORY_TRACT | 5 refills | Status: DC | PRN

## 2021-01-22 NOTE — Telephone Encounter (Signed)
01/22/21  Called and spoke with patient.  She is requesting refill of breath inhaler.  She reports that her insurance is changed.  She would like [facility] sent to [facility] [place].  She reports she is using her rescue inhaler 2-3 times a week.  Plan: We will refill albuterol rescue inhaler Patient to keep upcoming appointment on 02/22/2021 with Dr. Brandy Hale patient appointment today at 01/22/2021 at 12 PM, patient unable to make this due to transportation Patient aware to contact her office sooner if she has any acute respiratory needs  Nothing further needed  Wyn Quaker, FNP

## 2021-01-24 ENCOUNTER — Other Ambulatory Visit: Payer: Self-pay

## 2021-01-24 ENCOUNTER — Ambulatory Visit (INDEPENDENT_AMBULATORY_CARE_PROVIDER_SITE_OTHER): Payer: Medicare Other

## 2021-01-24 DIAGNOSIS — J4550 Severe persistent asthma, uncomplicated: Secondary | ICD-10-CM

## 2021-01-24 MED ORDER — MEPOLIZUMAB 100 MG ~~LOC~~ SOLR
100.0000 mg | Freq: Once | SUBCUTANEOUS | Status: AC
  Administered 2021-01-24: 100 mg via SUBCUTANEOUS

## 2021-01-24 NOTE — Progress Notes (Signed)
Have you been hospitalized within the last 10 days?  No Do you have a fever?  No Do you have a cough?  No Do you have a headache or sore throat? No Do you have your Epi Pen visible and is it within date?  Yes 

## 2021-01-25 ENCOUNTER — Telehealth: Payer: Self-pay | Admitting: Internal Medicine

## 2021-01-25 NOTE — Telephone Encounter (Signed)
Pt called to request refill for Prednisone 5mg  be sent to   [facility] Address: [address], [place], [place] [address] and asks that it be sent to her house.   Pt requests to be notified via MyChart when this has been done.

## 2021-01-28 MED ORDER — PREDNISONE 5 MG PO TABS
5.0000 mg | ORAL_TABLET | Freq: Every day | ORAL | 0 refills | Status: DC
Start: 2021-01-28 — End: 2021-05-02

## 2021-01-28 NOTE — Telephone Encounter (Signed)
Refilled sent as requested

## 2021-02-07 ENCOUNTER — Telehealth: Payer: Self-pay | Admitting: Pulmonary Disease

## 2021-02-11 NOTE — Telephone Encounter (Signed)
Called and spoke with Patient. Patient scheduled 1115 Ov with Dr. Halford Chessman.  Patient request Nucala injection after OV. Patient placed on injection schedule at 1200, but will receive injection after OV.  Patient aware she may have to wait until I finish up morning assignment to give Nucala.  Patient agreed it was no problem. Nothing further at this time.

## 2021-02-21 ENCOUNTER — Ambulatory Visit: Payer: Medicare Other

## 2021-02-22 ENCOUNTER — Encounter: Payer: Self-pay | Admitting: Pulmonary Disease

## 2021-02-22 ENCOUNTER — Telehealth: Payer: Self-pay | Admitting: Pulmonary Disease

## 2021-02-22 ENCOUNTER — Other Ambulatory Visit: Payer: Self-pay | Admitting: Pulmonary Disease

## 2021-02-22 ENCOUNTER — Other Ambulatory Visit: Payer: Self-pay

## 2021-02-22 ENCOUNTER — Ambulatory Visit (INDEPENDENT_AMBULATORY_CARE_PROVIDER_SITE_OTHER): Payer: Medicare Other | Admitting: Pulmonary Disease

## 2021-02-22 ENCOUNTER — Ambulatory Visit: Payer: Medicare Other

## 2021-02-22 VITALS — BP 134/84 | HR 71 | Temp 97.2°F | Ht 60.0 in | Wt 155.0 lb

## 2021-02-22 DIAGNOSIS — E2749 Other adrenocortical insufficiency: Secondary | ICD-10-CM

## 2021-02-22 DIAGNOSIS — J4550 Severe persistent asthma, uncomplicated: Secondary | ICD-10-CM | POA: Diagnosis not present

## 2021-02-22 DIAGNOSIS — J309 Allergic rhinitis, unspecified: Secondary | ICD-10-CM | POA: Diagnosis not present

## 2021-02-22 DIAGNOSIS — J45998 Other asthma: Secondary | ICD-10-CM

## 2021-02-22 DIAGNOSIS — J8289 Other pulmonary eosinophilia, not elsewhere classified: Secondary | ICD-10-CM

## 2021-02-22 LAB — CBC WITH DIFFERENTIAL/PLATELET
Basophils Absolute: 0 10*3/uL (ref 0.0–0.1)
Basophils Relative: 0.3 % (ref 0.0–3.0)
Eosinophils Absolute: 0.1 10*3/uL (ref 0.0–0.7)
Eosinophils Relative: 1.1 % (ref 0.0–5.0)
HCT: 38.9 % (ref 36.0–46.0)
Hemoglobin: 13 g/dL (ref 12.0–15.0)
Lymphocytes Relative: 14.3 % (ref 12.0–46.0)
Lymphs Abs: 1.2 10*3/uL (ref 0.7–4.0)
MCHC: 33.4 g/dL (ref 30.0–36.0)
MCV: 96.9 fl (ref 78.0–100.0)
Monocytes Absolute: 0.6 10*3/uL (ref 0.1–1.0)
Monocytes Relative: 7 % (ref 3.0–12.0)
Neutro Abs: 6.4 10*3/uL (ref 1.4–7.7)
Neutrophils Relative %: 77.3 % — ABNORMAL HIGH (ref 43.0–77.0)
Platelets: 179 10*3/uL (ref 150.0–400.0)
RBC: 4.02 Mil/uL (ref 3.87–5.11)
RDW: 13.5 % (ref 11.5–15.5)
WBC: 8.2 10*3/uL (ref 4.0–10.5)

## 2021-02-22 MED ORDER — ASMANEX (60 METERED DOSES) 220 MCG/INH IN AEPB: 2.0000 | INHALATION_SPRAY | Freq: Every day | RESPIRATORY_TRACT | 12 refills | Status: DC

## 2021-02-22 MED ORDER — ARNUITY ELLIPTA 100 MCG/ACT IN AEPB: 1.0000 | INHALATION_SPRAY | Freq: Every day | RESPIRATORY_TRACT | 6 refills | Status: DC

## 2021-02-22 MED ORDER — MEPOLIZUMAB 100 MG ~~LOC~~ SOLR
100.0000 mg | Freq: Once | SUBCUTANEOUS | Status: AC
  Administered 2021-02-22: 100 mg via SUBCUTANEOUS

## 2021-02-22 NOTE — Telephone Encounter (Signed)
Called and spoke with pt about her Asmanex inhaler. Pt stated the inhaler was going to cost her $250 which she cannot afford. Stated to pt that she needs to contact insurance company to get them to provide her a formulary list of covered inhalers so we could then give to Dr. Halford Chessman to review. Pt requested to have a message sent to her via mychart with this information. mychart message has been sent to pt. Nothing further needed.

## 2021-02-22 NOTE — Patient Instructions (Signed)
Lab tests today  Asmanex two puffs daily for 2 weeks, then as needed   Follow up in 6 months

## 2021-02-22 NOTE — Telephone Encounter (Signed)
Can send script for arnuity 100 one puff daily.

## 2021-02-22 NOTE — Telephone Encounter (Signed)
Patient was given card with date her next Nucala injection would be due.  Patient given instructions that someone will contact her in a few weeks from the [facility] to schedule appointment.  Injections will be done by [facility]fusion [facility] starting 03/11/21 and Patient will need Nucala injection 03/22/21. Called and spoke with Patient. Reviewed information with Patient about next injection date and Infusion Clinic calling to schedule appointment.  Understanding stated. Nothing further at this time.

## 2021-02-22 NOTE — Progress Notes (Signed)
[facility]  Chief Complaint  Patient presents with  . Follow-up    Sob getting worse for about a week. Some wheezing, taking albuterol and that is working well.     Constitutional:  BP 134/84 (BP Location: Left Arm, Cuff Size: Normal)   Pulse 71   Temp (!) 97.2 F (36.2 C)   Ht 5' (1.524 m)   Wt 155 lb (70.3 kg)   SpO2 94%   BMI 30.27 kg/m   Past Medical History:  Anemia, Anxiety, Arthritis, C2 fx, Cataract, Chronic headaches, Depression, GERD, Gout, Hiatal hernia, HTN, Osteoporosis, Urinary incontinence  Past Surgical History:  She  has a past surgical history that includes Shoulder surgery (Left, 08-2008); Total abdominal hysterectomy; Tonsillectomy and adenoidectomy; colonoscopy with polypectomy (06/2013); Nasal sinus surgery; Appendectomy; Esophageal dilation; Bronchoscopy (12-2010); Cataract extraction w/ intraocular lens  implant, bilateral (Bilateral); Knee arthroscopy (Right, 06/18/2015); Tonsillectomy; Cholecystectomy (N/A, 05/23/2016); Upper gastrointestinal endoscopy; Colonoscopy; Polypectomy; and Adenoidectomy.  Brief Summary:  Lisa Larson is a 80 y.o. 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      Subjective:   She has more chest tightness and wheeze over the past week with weather change.  Using albuterol every 6 hours and this helps.  Not having sputum, fever, skin rash, gland swelling, or hemoptysis.  Physical Exam:   Appearance - well kempt   ENMT - no sinus tenderness, no oral exudate, no LAN, Mallampati 2 airway, no stridor, poor dentition  Respiratory - equal breath sounds bilaterally, no wheezing or rales  CV - s1s2 regular rate and rhythm, no murmurs  Ext - no clubbing, no edema  Skin - no rashes  Psych - normal mood and affect   Pulmonary testing:   CBC 01/14/11>>39% eosinophils  01/14/11>>HIV negative, ACE 39, RF negative, ANA 1:40,  ANCA negative   BAL 01/21/11>>48% Eosinophils   IgE 02/25/11>>282  PFT 11/04/12>>FEV1 1.77 (87%), FEV1% 68, TLC 4.87 (101%), DLCO 69%, +BD from FEF 25-75%.  RAST 10/26/17 >> negative, IgE 108  Spirometry 08/09/19 >> FEV1 1.08 (64%), FEV1% 60  Chest Imaging:   CT chest 01/16/11 >> Multifocal b/l ASD with GGO  CT chest 07/22/11 >> resolution of ASD  CT chest 10/03/11 >> recurrence of nodular infiltrate Rt upper lobe  Social History:  She  reports that she quit smoking about 52 years ago. Her smoking use included cigarettes. She has a 15.00 pack-year smoking history. She has never used smokeless tobacco. She reports that she does not drink alcohol and does not use drugs.  Family History:  Her family history includes Alzheimer's disease in her mother; Arthritis in her father; Cancer in her son; Colitis in her mother; Diabetes in her brother and paternal grandmother; Heart attack in her paternal grandmother; Hypertension in her brother, father, and mother; Irritable bowel syndrome in her mother; Non-Hodgkin's lymphoma in her brother; Other in her son; Pulmonary embolism in her father; Rheum arthritis in her father.    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 - has been on nucala since March 2019 - has mild flare with weather change - will have her use asmanex for next two weeks, then as needed - prn albuterol  Allergic rhinitis. - prn flonase  Osteoarthritis. - f/u with PCP and rheumatology (Dr. 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 - followed by Dr. Kelton Pillar with endocrinology - maintained on 5 mg prednisone daily;  likely helping with asthma   Time Spent Involved in Patient Care on Day of Examination:  21 minutes  Follow up:  Patient Instructions  Lab tests today  Asmanex two puffs daily for 2 weeks, then as needed   Follow up in 6 months   Medication List:   Allergies  as of 02/22/2021      Reactions   Other Rash, Shortness Of Breath   Sulfonamide Derivatives Shortness Of Breath, Rash   Oxycodone-aspirin Other (See Comments)   Couldn't hear    Diclofenac Other (See Comments)   Unknown reaction    Prolia [denosumab]    Muscle pain, bone pain   Rofecoxib Other (See Comments)   Unknown reaction    Ace Inhibitors Cough   Benazepril Hcl Other (See Comments)   No PMH of angioedema; ACE-I caused cough   Tramadol Itching      Medication List       Accurate as of February 22, 2021 11:59 AM. If you have any questions, ask your nurse or doctor.        acetaminophen 500 MG tablet Commonly known as: TYLENOL Take 1,000 mg by mouth 3 (three) times daily as needed.   albuterol 108 (90 Base) MCG/ACT inhaler Commonly known as: ProAir HFA Inhale 2 puffs into the lungs every 6 (six) hours as needed for wheezing or shortness of breath.   Asmanex (60 Metered Doses) 220 MCG/INH inhaler Generic drug: mometasone Inhale 2 puffs into the lungs daily. Started by: [name], MD   calcium carbonate 600 MG tablet Commonly known as: OS-CAL Take 600 mg by mouth 2 (two) times daily.   Cetirizine HCl 10 MG Caps Take 1 capsule (10 mg total) by mouth daily.   famotidine 40 MG tablet Commonly known as: PEPCID Take 1 tablet (40 mg total) by mouth daily as needed for heartburn or indigestion.   fluticasone 50 MCG/ACT nasal spray Commonly known as: FLONASE Place 2 sprays into both nostrils daily.   gabapentin 100 MG capsule Commonly known as: NEURONTIN TAKE 1 CAPSULE(100 MG) BY MOUTH AT BEDTIME   gabapentin 300 MG capsule Commonly known as: NEURONTIN TAKE 1 CAPSULE(300 MG) BY MOUTH THREE TIMES DAILY   hydrOXYzine 25 MG tablet Commonly known as: ATARAX/VISTARIL TAKE 1 TABLET(25 MG) BY MOUTH AT BEDTIME AS NEEDED FOR SLEEP   LORazepam 1 MG tablet Commonly known as: ATIVAN TAKE 1/2 TABLET BY MOUTH EVERY 8- 12 HOURS AS NEEDED   losartan 100 MG  tablet Commonly known as: COZAAR TAKE 1 TABLET(100 MG) BY MOUTH DAILY   meloxicam 7.5 MG tablet Commonly known as: MOBIC TAKE 1 TABLET(7.5 MG) BY MOUTH DAILY   nystatin-triamcinolone ointment Commonly known as: MYCOLOG Apply 1 application topically 2 (two) times daily.   predniSONE 5 MG tablet Commonly known as: DELTASONE Take 1 tablet (5 mg total) by mouth daily with breakfast.   sertraline 100 MG tablet Commonly known as: ZOLOFT TAKE 2 TABLETS(200 MG) BY MOUTH DAILY   thiamine 100 MG tablet Commonly known as: Vitamin B-1 Take 100 mg by mouth daily as needed.   Vitamin D3 50 MCG (2000 UT) capsule Take 2,000 Units by mouth daily.       Signature:  [name], MD [facility] Pager - [phone] 02/22/2021, 11:59 AM

## 2021-02-22 NOTE — Telephone Encounter (Signed)
Medication has been sent to [facility] for this to be delivered to the pt. Nothing further is needed.

## 2021-02-22 NOTE — Telephone Encounter (Signed)
I called [facility] to see if the rx for the asmanex could be delivered per pts request.  The pharmacist stated that she spoke with the pt and the asmanex is too expensive and the pt is wanting something different.  VS please advise. Thanks

## 2021-02-24 ENCOUNTER — Other Ambulatory Visit (HOSPITAL_COMMUNITY): Payer: Self-pay | Admitting: Pharmacy Technician

## 2021-02-24 DIAGNOSIS — J8281 Chronic eosinophilic pneumonia: Secondary | ICD-10-CM | POA: Insufficient documentation

## 2021-02-24 DIAGNOSIS — J4550 Severe persistent asthma, uncomplicated: Secondary | ICD-10-CM

## 2021-02-25 ENCOUNTER — Encounter: Payer: Self-pay | Admitting: *Deleted

## 2021-02-25 ENCOUNTER — Telehealth: Payer: Self-pay | Admitting: Pulmonary Disease

## 2021-02-25 NOTE — Telephone Encounter (Signed)
02/25/2021  Contacted patient. Reviewed with patient that we would need to discuss which inhaler option would be the best moving forward after speaking with Dr. Halford Chessman.  Patient reporting that she feels that she is back to baseline. She is unsure if she needs a maintenance inhaler at this point in time.  Dr. Halford Chessman please advise if you have a preference moving forward with either the Flovent or Arnuity for an ICS as the Asmanex is not covered by patient's insurance.  Patient does not feel that she needs a maintenance inhaler or an as needed long-acting inhaler at this point in time. She reports that she will call us back over the next 2 to 4 weeks if her symptoms worsen and she feels like she does.  Patient would like a call back notifying which inhaler Dr. Halford Chessman would prefer for her to be on if she doesn't need to start utilizing one (Flovent or Arnuity).  Wyn Quaker, FNP

## 2021-02-25 NOTE — Telephone Encounter (Signed)
Pt was switched to Lisa Larson. Nothing further needed.

## 2021-02-25 NOTE — Telephone Encounter (Signed)
There is phone message from 02/22/21 stating that she can use arnuity 100 one puff daily and script was sent to [facility].  However, if she is feeling better then she can forego starting arnuity.

## 2021-02-25 NOTE — Telephone Encounter (Signed)
Called and spoke with pt letting her know the info stated by VS in regards to inhaler preference. Pt verbalized understanding. Nothing further needed.

## 2021-02-26 NOTE — Telephone Encounter (Signed)
Noted. My apologies on missing that message   Lisa Larson

## 2021-03-18 ENCOUNTER — Other Ambulatory Visit: Payer: Self-pay | Admitting: Internal Medicine

## 2021-03-26 ENCOUNTER — Telehealth: Payer: Self-pay | Admitting: Pharmacy Technician

## 2021-03-26 ENCOUNTER — Other Ambulatory Visit: Payer: Self-pay

## 2021-03-26 ENCOUNTER — Ambulatory Visit (INDEPENDENT_AMBULATORY_CARE_PROVIDER_SITE_OTHER): Payer: Medicare Other

## 2021-03-26 ENCOUNTER — Ambulatory Visit: Payer: Medicare Other

## 2021-03-26 VITALS — BP 134/86 | HR 72 | Temp 98.1°F | Resp 20

## 2021-03-26 DIAGNOSIS — J4550 Severe persistent asthma, uncomplicated: Secondary | ICD-10-CM

## 2021-03-26 DIAGNOSIS — J8281 Chronic eosinophilic pneumonia: Secondary | ICD-10-CM | POA: Diagnosis not present

## 2021-03-26 MED ORDER — EPINEPHRINE 0.3 MG/0.3ML IJ SOAJ: 0.3000 mg | Freq: Once | INTRAMUSCULAR | Status: DC | PRN

## 2021-03-26 MED ORDER — ALBUTEROL SULFATE HFA 108 (90 BASE) MCG/ACT IN AERS: 2.0000 | INHALATION_SPRAY | Freq: Once | RESPIRATORY_TRACT | Status: DC | PRN

## 2021-03-26 MED ORDER — FAMOTIDINE IN NACL 20-0.9 MG/50ML-% IV SOLN: 20.0000 mg | Freq: Once | INTRAVENOUS | Status: DC | PRN

## 2021-03-26 MED ORDER — DIPHENHYDRAMINE HCL 50 MG/ML IJ SOLN: 50.0000 mg | Freq: Once | INTRAMUSCULAR | Status: DC | PRN

## 2021-03-26 MED ORDER — METHYLPREDNISOLONE SODIUM SUCC 125 MG IJ SOLR: 125.0000 mg | Freq: Once | INTRAMUSCULAR | Status: DC | PRN

## 2021-03-26 MED ORDER — MEPOLIZUMAB 100 MG/ML ~~LOC~~ SOSY
100.0000 mg | PREFILLED_SYRINGE | Freq: Once | SUBCUTANEOUS | Status: AC
  Administered 2021-03-26: 100 mg via SUBCUTANEOUS
  Filled 2021-03-26: qty 1

## 2021-03-26 MED ORDER — SODIUM CHLORIDE 0.9 % IV SOLN: Freq: Once | INTRAVENOUS | Status: DC | PRN

## 2021-03-26 NOTE — Telephone Encounter (Signed)
Received message from infusion team that patient is interested in self-injecting. Next injection is scheduled for 4/26 @ 10am.  Will start BIV for home injections.

## 2021-03-26 NOTE — Progress Notes (Signed)
{  Diagnosis: Asthma Provider: Marshell Garfinkel   Procedure: Injection, Medication: Nucala, Site: subcutaneous by Arnoldo Morale, RN  Discharge: Condition: Good, Destination: Home . AVS provided. by Arnoldo Morale, RN

## 2021-03-28 NOTE — Telephone Encounter (Signed)
Submitted a Prior Authorization request to [facility] for [employer] PEN via Cover My Meds. Will update once we receive a response.   Key: [id number] - PA Case ID: [id number]   Will need an updated Nucala enrollment form signed. Called patient and discussed. She says she got her 1st bill since switching to getting her injection at the [facility] and she now has a balance to pay for injection. Previous in-office injection was free.  Will mail patient Nucala PAP app.

## 2021-03-28 NOTE — Telephone Encounter (Signed)
Provider portion placed in MD's box.

## 2021-04-01 NOTE — Telephone Encounter (Signed)
Patient sent email   How much will it cost me if I can give it to myself  at home? My ins. Paid all but about 200 dollars last time when I got it at the office. I can't  pay that much ev. Month.  Thanks, Lisa Larson  [phone]. Or is there  another generic drug that would work?  Routing to Pharmacy as they manage injections

## 2021-04-01 NOTE — Progress Notes (Signed)
Diagnosis: Asthma  Provider:  Praveen Mannam, MD  Procedure: Injection  Nucala (Mepolizumab), Dose: 100 mg, Site: subcutaneous  Discharge: Condition: Good, Destination: Home . AVS provided to patient.   Performed by:  Allie Gerhold, RN        

## 2021-04-05 ENCOUNTER — Telehealth: Payer: Self-pay | Admitting: Pulmonary Disease

## 2021-04-05 NOTE — Telephone Encounter (Signed)
Returned call to Lisa Larson - she will plan to drop off Nucala patient assistance application today if possible.  Advised that it would likely be autoinjector Nucala we would use. Daughter does use Dupixent prefilled syringes herself.   Patient's son, Will, may administer for patient if she doesn't have dexterity. Advised that Will could come with patient to training appointment. However daughter states that patient will not come to doctor's office (but no reason given other than he does not want to come to doctor's office) - he will drop her off or Lisa Larson will join in training if she brings her mother  Knox Saliva, PharmD, MPH Clinical Pharmacist (Rheumatology and Pulmonology)

## 2021-04-08 NOTE — Telephone Encounter (Signed)
Submitted Patient Assistance Application to [place] for [place] along with provider portion. Will update patient when we receive a response.  Fax# [phone] Phone# [phone]

## 2021-04-08 NOTE — Telephone Encounter (Signed)
Forwarding to pharmacy team. Please see attachments from pt regarding Nucala coverage denial. Thanks!

## 2021-04-10 ENCOUNTER — Other Ambulatory Visit (HOSPITAL_COMMUNITY): Payer: Self-pay

## 2021-04-10 NOTE — Telephone Encounter (Signed)
Patient scheduled to transition to self-admin of Nucala on 04/23/21 @1 :20pm.  Advised patient to bring family member if they will be administering for patient. Patient would like to self-administer.   Patient advised we will complete Epipen patient assistance paperwork at visit  Knox Saliva, PharmD, MPH Clinical Pharmacist (Rheumatology and Pulmonology)

## 2021-04-10 NOTE — Telephone Encounter (Signed)
Received a fax from  [place] to [place] regarding an approval for [place] PEN patient assistance from 04/09/21 to 12/28/21.   Phone number: [phone]  Medication will ship from [facility] order. Patient is ready to learn to self-inject

## 2021-04-10 NOTE — Telephone Encounter (Signed)
Left VM with patient and daughter, [name], to schedule Nucala self-admin training requesting return call. Will f/u

## 2021-04-11 ENCOUNTER — Other Ambulatory Visit: Payer: Self-pay | Admitting: Pharmacy Technician

## 2021-04-14 ENCOUNTER — Telehealth: Payer: Self-pay | Admitting: Pulmonary Disease

## 2021-04-14 NOTE — Telephone Encounter (Signed)
Approval received for the Nucala injection. Approved from 03/30/2021 until 12/28/2021 Authorization # [id number] Any questions to contact [facility] at [phone]  Will forward over to Pharmacy staff to make them aware.

## 2021-04-15 NOTE — Telephone Encounter (Signed)
Noted! Thank you

## 2021-04-19 NOTE — Telephone Encounter (Signed)
Spoke with patient's daughter and r/s Nucala transition to home for 04/29/21 @ 1:20pm so she can join the visit. States that patient has weak hand strength and may not be able to self-administer.  Spoke with patient regarding change in appointment time and she verbalized understanding and has noted on her calendar  Lisa Larson, PharmD, MPH Clinical Pharmacist (Rheumatology and Pulmonology)

## 2021-04-23 ENCOUNTER — Other Ambulatory Visit: Payer: Medicare Other | Admitting: Pharmacist

## 2021-04-23 ENCOUNTER — Ambulatory Visit: Payer: Medicare Other

## 2021-04-29 ENCOUNTER — Ambulatory Visit: Payer: Medicare Other | Admitting: Pharmacist

## 2021-04-29 ENCOUNTER — Other Ambulatory Visit (HOSPITAL_COMMUNITY): Payer: Self-pay

## 2021-04-29 ENCOUNTER — Other Ambulatory Visit: Payer: Self-pay

## 2021-04-29 DIAGNOSIS — Z79899 Other long term (current) drug therapy: Secondary | ICD-10-CM

## 2021-04-29 DIAGNOSIS — Z7189 Other specified counseling: Secondary | ICD-10-CM

## 2021-04-29 DIAGNOSIS — J4550 Severe persistent asthma, uncomplicated: Secondary | ICD-10-CM

## 2021-04-29 MED ORDER — NUCALA 100 MG/ML ~~LOC~~ SOAJ: 100.0000 mg | SUBCUTANEOUS | 5 refills | Status: DC

## 2021-04-29 NOTE — Patient Instructions (Signed)
Your  Nucala dose is due 5/30,  6/27, then every 4 weeks thereafter  Please call [facility] to schedule shipment. [phone]  Remember the 5 C's:  COUNTER - leave on the counter at least 30 minutes but up to overnight to bring medication to room temperature. This may help prevent stinging  COLD - place something cold (like an ice gel pack or cold water bottle) on the injection site just before cleansing with alcohol. This may help reduce pain  CLARITIN - use Claritin (generic name is loratadine) for the first two weeks of treatment or the day of, the day before, and the day after injecting. This will help to minimize injection site reactions  CORTISONE CREAM - apply if injection site is irritated and itching  CALL ME - if injection site reaction is bigger than the size of your fist, looks infected, blisters, or if you develop hives

## 2021-04-29 NOTE — Progress Notes (Signed)
HPI Lisa Larson presents today to [facility] with her daughter, [name], to see pharmacy team to transition to self-administration of Nucala.  Past medical history includes: severe persistent asthma, allergic rhiintis, GERD, osteoporosis, depression/anxiety. She has adrenal insufficiency and dose wear a medical bracelet indicating - managed on prednisone by Dr. Kelton Pillar (states she has 6 tabs remaining). Her Nucala was started in May 2019 and has received in the clinic since then.  Number of hospitalizations in past year: none Number of asthma exacerbations in past year:  none  Respiratory Medications Current: Arnuity 129mcg one puff daily,  Patient reports no known adherence challenges  OBJECTIVE Allergies  Allergen Reactions  . Other Rash and Shortness Of Breath  . Sulfonamide Derivatives Shortness Of Breath and Rash  . Oxycodone-Aspirin Other (See Comments)    Couldn't hear   . Diclofenac Other (See Comments)    Unknown reaction   . Prolia [Denosumab]     Muscle pain, bone pain  . Rofecoxib Other (See Comments)    Unknown reaction   . Ace Inhibitors Cough  . Benazepril Hcl Other (See Comments)    No PMH of angioedema; ACE-I caused cough  . Tramadol Itching    Outpatient Encounter Medications as of 04/29/2021  Medication Sig Note  . acetaminophen (TYLENOL) 500 MG tablet Take 1,000 mg by mouth 3 (three) times daily as needed.   Marland Kitchen albuterol (PROAIR HFA) 108 (90 Base) MCG/ACT inhaler Inhale 2 puffs into the lungs every 6 (six) hours as needed for wheezing or shortness of breath.   . calcium carbonate (OS-CAL) 600 MG tablet Take 600 mg by mouth 2 (two) times daily.   . Cetirizine HCl 10 MG CAPS Take 1 capsule (10 mg total) by mouth daily.   . Cholecalciferol (VITAMIN D3) 2000 UNITS capsule Take 2,000 Units by mouth daily.   . famotidine (PEPCID) 40 MG tablet Take 1 tablet (40 mg total) by mouth daily as needed for heartburn or indigestion.   . fluticasone (FLONASE) 50  MCG/ACT nasal spray Place 2 sprays into both nostrils daily.   . Fluticasone Furoate (ARNUITY ELLIPTA) 100 MCG/ACT AEPB Inhale 1 puff into the lungs daily.   Marland Kitchen gabapentin (NEURONTIN) 100 MG capsule TAKE 1 CAPSULE(100 MG) BY MOUTH AT BEDTIME   . gabapentin (NEURONTIN) 300 MG capsule TAKE 1 CAPSULE(300 MG) BY MOUTH THREE TIMES DAILY   . hydrOXYzine (ATARAX/VISTARIL) 25 MG tablet TAKE 1 TABLET(25 MG) BY MOUTH AT BEDTIME AS NEEDED FOR SLEEP   . LORazepam (ATIVAN) 1 MG tablet TAKE 1/2 TABLET BY MOUTH EVERY 8- 12 HOURS AS NEEDED   . losartan (COZAAR) 100 MG tablet TAKE 1 TABLET(100 MG) BY MOUTH DAILY   . meloxicam (MOBIC) 7.5 MG tablet TAKE 1 TABLET(7.5 MG) BY MOUTH DAILY   . nystatin-triamcinolone ointment (MYCOLOG) Apply 1 application topically 2 (two) times daily.   . predniSONE (DELTASONE) 5 MG tablet Take 1 tablet (5 mg total) by mouth daily with breakfast.   . sertraline (ZOLOFT) 100 MG tablet TAKE 2 TABLETS(200 MG) BY MOUTH DAILY   . thiamine (VITAMIN B-1) 100 MG tablet Take 100 mg by mouth daily as needed.  11/12/2015: .   Facility-Administered Encounter Medications as of 04/29/2021  Medication  . Mepolizumab SOLR 100 mg  . Mepolizumab SOLR 100 mg     Immunization History  Administered Date(s) Administered  . Fluad Quad(high Dose 65+) 08/26/2019  . Influenza Split 09/30/2011, 10/12/2012  . Influenza Whole 12/29/2001, 11/02/2007, 09/29/2008, 09/08/2009, 11/15/2010  . Influenza, High  Dose Seasonal PF 10/12/2013, 10/30/2015, 09/22/2016, 10/14/2017, 10/18/2018  . Influenza,inj,Quad PF,6+ Mos 10/02/2014  . Pneumococcal Conjugate-13 03/26/2016  . Pneumococcal Polysaccharide-23 09/08/2009, 10/18/2018  . Tdap 04/04/2013     Eosinophils Most recent blood eosinophil count was 300 on 05/25/20 -> 100 cells/microL taken on 02/22/21.   IgE:  02/25/11: 282  Assessment   1. Biologics training (Mepolizumab (Nucala) o MOA: Not fully understood. It does act an interleukin-5 (IL-5) antagonist  monoclonal antibody that reduces the production and survival of eosinophils by blocking the binding of IL-5 to the alpha chain of the receptor complex on the eosinophil cell surface. o Response to therapy: ??? o Side effects: headache (19%), injection site reaxtion (7-15%), antibody development (6%), backache (5%), fatigue (5%) o Dosing: 100-300 mg subQ every 4 weeks o Administration:  2.  For the 300 mg dose, administer as 3 separate 100 mg injections into the upper arm, thigh, or abdomen ?5 cm (~2 inches) apart if >1 injection administered at same site.  3. Do not shake the reconstituted solution as this could lead to product foaming or precipitation. 4. The solution should be clear to opalescent and colorless to pale yellow or pale brown, essentially particle free. Small air bubbles, however, are expected and acceptable. If particulate matter remains in the solution or if the solution appears cloudy or milky, discard the solution.   She was able to self-adminsiter in the right thigh using pharmacy-supplied Nucala auto-injector.  2. Medication Reconciliation  A drug regimen assessment was performed, including review of allergies, interactions, disease-state management, dosing and immunization history. Medications were reviewed with the patient, including name, instructions, indication, goals of therapy, potential side effects, importance of adherence, and safe use.  Drug interaction(s):  None identified  3. Immunizations  Patient UTD on pneumonia vaccine. She has not received COVID19 vaccine.  PLAN - Continue Nucala 100mg  every 28 days. Patient provided with 2022 calendar with dose due dates. Prescription already sent with Nucala patient assistance application and ordered as no-print today. She was provided with the pharmacy phone number and advised to scheduled shipment in 2 weeks. - She will continue her maintenance regimen of Arnuity, fluticasone nasal spray, and cetirizine. - Message  sent to Dr. Kelton Pillar regarding her prednisone refill request. - Application for Epipen patient assistance signed by patient. Will place in Dr. Juanetta Gosling box to be signed by him.  Refill for prednisone to [facility] All questions encouraged and answered.  Instructed patient to reach out with any further questions or concerns.  Thank you for allowing pharmacy to participate in this patient's care.  Knox Saliva, PharmD, MPH Clinical Pharmacist (Rheumatology and Pulmonology)

## 2021-05-01 ENCOUNTER — Other Ambulatory Visit: Payer: Self-pay | Admitting: Internal Medicine

## 2021-05-02 ENCOUNTER — Telehealth: Payer: Self-pay | Admitting: Internal Medicine

## 2021-05-02 ENCOUNTER — Telehealth: Payer: Self-pay | Admitting: Pharmacy Technician

## 2021-05-02 MED ORDER — PREDNISONE 5 MG PO TABS: 5.0000 mg | ORAL_TABLET | Freq: Every day | ORAL | 0 refills | Status: DC

## 2021-05-02 NOTE — Telephone Encounter (Signed)
MEDICATION: predniSONE (DELTASONE) 5 MG tablet  PHARMACY:   [facility], [place] [address] Phone:  [phone]  Fax:  [phone]      HAS THE PATIENT CONTACTED [facility]? Yes-PHARM told Patient refill was refused by Dr. Kelton Pillar  IS THIS A 90 DAY SUPPLY : ?  IS PATIENT OUT OF MEDICATION: No  IF NOT; HOW MUCH IS LEFT: Approx. 5 days left  LAST APPOINTMENT DATE: @5 /02/2020  NEXT APPOINTMENT DATE:@5 /26/2022  DO WE HAVE YOUR PERMISSION TO LEAVE A DETAILED MESSAGE?:Yes  OTHER COMMENTS:    **Let patient know to contact pharmacy at the end of the day to make sure medication is ready. **  ** Please notify patient to allow 48-72 hours to process**  **Encourage patient to contact the pharmacy for refills or they can request refills through [facility]**

## 2021-05-02 NOTE — Telephone Encounter (Signed)
Refill sent since patient have schedule the follow up appointment

## 2021-05-02 NOTE — Telephone Encounter (Signed)
Submitted Patient Assistance Application to [employer] for [facility] along with provider portion. Will update patient when we receive a response.  Fax# [phone] Phone# [phone]

## 2021-05-06 NOTE — Telephone Encounter (Signed)
Received fax from [place] for Epipen patient assistance, patient's application has been DENIED due to patient having an active Production manager. Program is only for uninsured patients  Phone# [phone]

## 2021-05-06 NOTE — Telephone Encounter (Signed)
Spoke with daughter [name] over the phone and advised that patient will have to pay for her Epipen through insurance. Advised that patient does not qualify d/t Medicare coverage. She verbalized understanding  Knox Saliva, PharmD, MPH Clinical Pharmacist (Rheumatology and Pulmonology)

## 2021-05-15 ENCOUNTER — Other Ambulatory Visit: Payer: Self-pay

## 2021-05-15 ENCOUNTER — Ambulatory Visit (INDEPENDENT_AMBULATORY_CARE_PROVIDER_SITE_OTHER): Payer: Medicare Other | Admitting: Orthopedic Surgery

## 2021-05-15 DIAGNOSIS — M1712 Unilateral primary osteoarthritis, left knee: Secondary | ICD-10-CM

## 2021-05-15 DIAGNOSIS — M1611 Unilateral primary osteoarthritis, right hip: Secondary | ICD-10-CM | POA: Diagnosis not present

## 2021-05-15 DIAGNOSIS — M1711 Unilateral primary osteoarthritis, right knee: Secondary | ICD-10-CM

## 2021-05-15 MED ORDER — HYDROCODONE-ACETAMINOPHEN 5-325 MG PO TABS: ORAL_TABLET | ORAL | 0 refills | Status: DC

## 2021-05-16 ENCOUNTER — Encounter: Payer: Self-pay | Admitting: Orthopedic Surgery
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
## 2021-05-23 ENCOUNTER — Telehealth: Payer: Self-pay | Admitting: Internal Medicine

## 2021-05-23 ENCOUNTER — Telehealth (INDEPENDENT_AMBULATORY_CARE_PROVIDER_SITE_OTHER): Payer: Medicare Other | Admitting: Internal Medicine

## 2021-05-23 ENCOUNTER — Encounter: Payer: Self-pay | Admitting: Physical Medicine & Rehabilitation

## 2021-05-23 ENCOUNTER — Other Ambulatory Visit: Payer: Self-pay

## 2021-05-23 DIAGNOSIS — E2749 Other adrenocortical insufficiency: Secondary | ICD-10-CM

## 2021-05-23 MED ORDER — PREDNISONE 5 MG PO TABS: 5.0000 mg | ORAL_TABLET | Freq: Every day | ORAL | 3 refills | Status: DC
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
## 2021-05-23 NOTE — Patient Instructions (Signed)
 -   Continue prednisone 5 mg 1 tablet daily with breakfast  ADRENAL INSUFFICIENCY SICK DAY RULES:  Should you face an extreme emotional or physical stress such as trauma, surgery or acute illness, this will require extra steroid coverage so that the body can meet that stress.   Without increasing the steroid dose you may experience severe weakness, headache, dizziness, nausea and vomiting and possibly a more serious deterioration in health.  Typically the dose of steroids will only need to be increased for a couple of days if you have an illness that is transient and managed in the community.   If you are unable to take/absorb an increased dose of steroids orally because of vomiting or diarrhea, you will urgently require steroid injections and should present to an Emergency Department.  The general advice for any serious illness is as follows: 1. Double the normal daily steroid dose for up to 3 days if you have a temperature of more than 37.50C (99.50F) with signs of sickness, or severe emotional or physical distress 2. Contact your primary care doctor and Endocrinologist if the illness worsens or it lasts for more than 3 days.  3. In cases of severe illness, urgent medical assistance should be promptly sought. 4. If you experience vomiting/diarrhea or are unable to take steroids by mouth, please administer the Hydrocortisone injection kit and seek urgent medical help.    

## 2021-05-23 NOTE — Telephone Encounter (Signed)
Attempted to call the pt three times on 05/23/2021 between 9:30 and 9:45 AM as I am unable to connect virtually   Left a message to call back.    Abby Nena Jordan, MD  [facility] Endocrinology  [facility] [address] [place], [place] [address] Phone: [phone] FAX: [phone]

## 2021-05-28 ENCOUNTER — Ambulatory Visit: Payer: Medicare Other

## 2021-05-30 ENCOUNTER — Telehealth: Payer: Self-pay | Admitting: Pulmonary Disease
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
## 2021-06-05 ENCOUNTER — Other Ambulatory Visit: Payer: Self-pay | Admitting: Internal Medicine

## 2021-06-05 DIAGNOSIS — F419 Anxiety disorder, unspecified: Secondary | ICD-10-CM

## 2021-06-25 ENCOUNTER — Other Ambulatory Visit (INDEPENDENT_AMBULATORY_CARE_PROVIDER_SITE_OTHER): Payer: Medicare Other

## 2021-06-25 ENCOUNTER — Telehealth: Payer: Self-pay | Admitting: Internal Medicine

## 2021-06-25 ENCOUNTER — Other Ambulatory Visit: Payer: Self-pay | Admitting: Internal Medicine

## 2021-06-25 ENCOUNTER — Ambulatory Visit: Payer: Medicare Other

## 2021-06-25 ENCOUNTER — Other Ambulatory Visit: Payer: Self-pay

## 2021-06-25 DIAGNOSIS — R300 Dysuria: Secondary | ICD-10-CM

## 2021-06-25 LAB — URINALYSIS, ROUTINE W REFLEX MICROSCOPIC
Bilirubin Urine: NEGATIVE
Ketones, ur: NEGATIVE
Nitrite: NEGATIVE
Specific Gravity, Urine: 1.01 (ref 1.000–1.030)
Total Protein, Urine: NEGATIVE
Urine Glucose: NEGATIVE
Urobilinogen, UA: 0.2 (ref 0.0–1.0)
pH: 6 (ref 5.0–8.0)

## 2021-06-25 NOTE — Telephone Encounter (Signed)
   Patient called and said that she is having frequent urination and burning when urinating. She is requesting a lab ordered be placed for a UA. She said that she can have her son bring the specimen. She can be reached at [phone]. Please advise

## 2021-06-25 NOTE — Telephone Encounter (Signed)
1.Medication Requested: sertraline (ZOLOFT) 100 MG tablet  gabapentin (NEURONTIN) 100 MG capsule  2. Pharmacy (Name, [address], [place], [place]  3. On Med List: yes   4. Last Visit with PCP: 05-25-20  5. Next visit date with PCP: n/a    Agent: Please be advised that RX refills may take up to 3 business days. We ask that you follow-up with your pharmacy.

## 2021-06-25 NOTE — Telephone Encounter (Signed)
Urine tests ordered - can drop off sample

## 2021-06-25 NOTE — Telephone Encounter (Signed)
   Patient called again and said that she a sample. Please advise

## 2021-06-26 NOTE — Telephone Encounter (Signed)
   Patient called and was wondering if she would be getting a medication for a UTI. She is requesting a call back. Please advise

## 2021-06-26 NOTE — Telephone Encounter (Signed)
Patient called again

## 2021-06-27 ENCOUNTER — Encounter: Payer: Self-pay | Admitting: Internal Medicine

## 2021-06-27 LAB — URINE CULTURE

## 2021-06-27 MED ORDER — CEPHALEXIN 500 MG PO CAPS: 500.0000 mg | ORAL_CAPSULE | Freq: Two times a day (BID) | ORAL | 0 refills | Status: DC

## 2021-06-27 NOTE — Telephone Encounter (Signed)
Pt daughter [name]) called and wanted to know the results of her mother's UA and urine cx. The UA is resulted in Epic, the urine CX was faxed, and placed on desk for review. [name] would like to know if antibiotics can be prescribed to her mother.

## 2021-06-27 NOTE — Telephone Encounter (Signed)
Spoke with pt daughter [name]) in regards to the pt, they are wanting to know results of urine test and to get medication sent to pharmacy. Sent message to provider to advise.

## 2021-06-27 NOTE — Telephone Encounter (Signed)
Urine culture came back today - there is an infection.  Antibiotic sent to pharmacy.

## 2021-06-27 NOTE — Telephone Encounter (Signed)
Called and spoke with pt about UA results and antibiotics sent to pharmacy. Pt verbalized understanding.

## 2021-07-10 ENCOUNTER — Other Ambulatory Visit: Payer: Self-pay | Admitting: Internal Medicine

## 2021-07-16 ENCOUNTER — Other Ambulatory Visit: Payer: Self-pay

## 2021-07-16 ENCOUNTER — Encounter: Payer: Self-pay | Admitting: Physical Medicine & Rehabilitation

## 2021-07-16 ENCOUNTER — Encounter: Payer: Medicare Other | Attending: Physical Medicine & Rehabilitation | Admitting: Physical Medicine & Rehabilitation

## 2021-07-16 VITALS — BP 130/86 | HR 74 | Temp 98.3°F | Ht 60.0 in | Wt 148.0 lb

## 2021-07-16 DIAGNOSIS — M170 Bilateral primary osteoarthritis of knee: Secondary | ICD-10-CM

## 2021-07-16 DIAGNOSIS — M1611 Unilateral primary osteoarthritis, right hip: Secondary | ICD-10-CM | POA: Insufficient documentation

## 2021-07-16 DIAGNOSIS — Z79891 Long term (current) use of opiate analgesic: Secondary | ICD-10-CM | POA: Insufficient documentation

## 2021-07-16 DIAGNOSIS — Z5181 Encounter for therapeutic drug level monitoring: Secondary | ICD-10-CM | POA: Diagnosis not present

## 2021-07-16 DIAGNOSIS — G894 Chronic pain syndrome: Secondary | ICD-10-CM | POA: Insufficient documentation
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
## 2021-07-22 LAB — DRUG TOX MONITOR 1 W/CONF, ORAL FLD
Amphetamines: NEGATIVE ng/mL (ref <10)
Barbiturates: NEGATIVE ng/mL (ref <10)
Benzodiazepines: NEGATIVE ng/mL (ref <0.50)
Buprenorphine: NEGATIVE ng/mL (ref <0.10)
Cocaine: NEGATIVE ng/mL (ref <5.0)
Codeine: NEGATIVE ng/mL (ref <2.5)
Dihydrocodeine: NEGATIVE ng/mL (ref <2.5)
Fentanyl: NEGATIVE ng/mL (ref <0.10)
Heroin Metabolite: NEGATIVE ng/mL (ref <1.0)
Hydrocodone: 10 ng/mL — ABNORMAL HIGH (ref <2.5)
Hydromorphone: NEGATIVE ng/mL (ref <2.5)
MARIJUANA: NEGATIVE ng/mL (ref <2.5)
MDMA: NEGATIVE ng/mL (ref <10)
Meprobamate: NEGATIVE ng/mL (ref <2.5)
Methadone: NEGATIVE ng/mL (ref <5.0)
Morphine: NEGATIVE ng/mL (ref <2.5)
Nicotine Metabolite: NEGATIVE ng/mL (ref <5.0)
Norhydrocodone: NEGATIVE ng/mL (ref <2.5)
Noroxycodone: NEGATIVE ng/mL (ref <2.5)
Opiates: POSITIVE ng/mL — AB (ref <2.5)
Oxycodone: NEGATIVE ng/mL (ref <2.5)
Oxymorphone: NEGATIVE ng/mL (ref <2.5)
Phencyclidine: NEGATIVE ng/mL (ref <10)
Tapentadol: NEGATIVE ng/mL (ref <5.0)
Tramadol: NEGATIVE ng/mL (ref <5.0)
Zolpidem: NEGATIVE ng/mL (ref <5.0)

## 2021-07-22 LAB — DRUG TOX ALC METAB W/CON, ORAL FLD: Alcohol Metabolite: NEGATIVE ng/mL (ref <25)

## 2021-07-23 ENCOUNTER — Ambulatory Visit: Payer: Medicare Other

## 2021-07-24 ENCOUNTER — Other Ambulatory Visit: Payer: Self-pay | Admitting: Internal Medicine

## 2021-07-24 NOTE — Telephone Encounter (Signed)
pt has called asking if Dr. Quay Burow has sent any medications to her pharmacy today as she has gotten a notification from the pharmacy but there is no meds there to be picked up?  Please send her a MyCHart message in regards to this if possible.

## 2021-07-25 ENCOUNTER — Telehealth: Payer: Self-pay | Admitting: *Deleted

## 2021-07-25 NOTE — Telephone Encounter (Signed)
Oral swab drug screen was consistent for prescribed medications.  ?

## 2021-07-30 DIAGNOSIS — G894 Chronic pain syndrome: Secondary | ICD-10-CM | POA: Diagnosis not present

## 2021-07-30 DIAGNOSIS — Z79899 Other long term (current) drug therapy: Secondary | ICD-10-CM | POA: Diagnosis not present

## 2021-07-30 DIAGNOSIS — I10 Essential (primary) hypertension: Secondary | ICD-10-CM | POA: Diagnosis not present

## 2021-07-30 DIAGNOSIS — M109 Gout, unspecified: Secondary | ICD-10-CM | POA: Diagnosis not present

## 2021-07-30 DIAGNOSIS — M170 Bilateral primary osteoarthritis of knee: Secondary | ICD-10-CM | POA: Diagnosis not present

## 2021-07-30 DIAGNOSIS — Z7952 Long term (current) use of systemic steroids: Secondary | ICD-10-CM | POA: Diagnosis not present

## 2021-07-30 DIAGNOSIS — E2740 Unspecified adrenocortical insufficiency: Secondary | ICD-10-CM | POA: Diagnosis not present

## 2021-07-30 DIAGNOSIS — K219 Gastro-esophageal reflux disease without esophagitis: Secondary | ICD-10-CM | POA: Diagnosis not present

## 2021-07-30 DIAGNOSIS — M810 Age-related osteoporosis without current pathological fracture: Secondary | ICD-10-CM | POA: Diagnosis not present

## 2021-07-30 DIAGNOSIS — Z792 Long term (current) use of antibiotics: Secondary | ICD-10-CM | POA: Diagnosis not present

## 2021-07-30 DIAGNOSIS — F32A Depression, unspecified: Secondary | ICD-10-CM | POA: Diagnosis not present

## 2021-07-30 DIAGNOSIS — J449 Chronic obstructive pulmonary disease, unspecified: Secondary | ICD-10-CM | POA: Diagnosis not present

## 2021-07-30 DIAGNOSIS — Z791 Long term (current) use of non-steroidal anti-inflammatories (NSAID): Secondary | ICD-10-CM | POA: Diagnosis not present

## 2021-07-30 DIAGNOSIS — Z87440 Personal history of urinary (tract) infections: Secondary | ICD-10-CM | POA: Diagnosis not present

## 2021-07-30 DIAGNOSIS — Z7951 Long term (current) use of inhaled steroids: Secondary | ICD-10-CM | POA: Diagnosis not present

## 2021-07-30 DIAGNOSIS — M4312 Spondylolisthesis, cervical region: Secondary | ICD-10-CM | POA: Diagnosis not present

## 2021-07-30 DIAGNOSIS — M1611 Unilateral primary osteoarthritis, right hip: Secondary | ICD-10-CM | POA: Diagnosis not present

## 2021-07-30 DIAGNOSIS — F419 Anxiety disorder, unspecified: Secondary | ICD-10-CM | POA: Diagnosis not present

## 2021-07-30 DIAGNOSIS — Z8701 Personal history of pneumonia (recurrent): Secondary | ICD-10-CM | POA: Diagnosis not present

## 2021-07-30 DIAGNOSIS — D649 Anemia, unspecified: Secondary | ICD-10-CM | POA: Diagnosis not present

## 2021-07-30 DIAGNOSIS — Z79891 Long term (current) use of opiate analgesic: Secondary | ICD-10-CM | POA: Diagnosis not present

## 2021-07-30 DIAGNOSIS — M4802 Spinal stenosis, cervical region: Secondary | ICD-10-CM | POA: Diagnosis not present

## 2021-07-30 DIAGNOSIS — M5136 Other intervertebral disc degeneration, lumbar region: Secondary | ICD-10-CM | POA: Diagnosis not present

## 2021-07-30 DIAGNOSIS — M5030 Other cervical disc degeneration, unspecified cervical region: Secondary | ICD-10-CM | POA: Diagnosis not present

## 2021-07-30 DIAGNOSIS — M47812 Spondylosis without myelopathy or radiculopathy, cervical region: Secondary | ICD-10-CM | POA: Diagnosis not present

## 2021-07-31 DIAGNOSIS — M4802 Spinal stenosis, cervical region: Secondary | ICD-10-CM | POA: Diagnosis not present

## 2021-07-31 DIAGNOSIS — M1611 Unilateral primary osteoarthritis, right hip: Secondary | ICD-10-CM | POA: Diagnosis not present

## 2021-07-31 DIAGNOSIS — J449 Chronic obstructive pulmonary disease, unspecified: Secondary | ICD-10-CM | POA: Diagnosis not present

## 2021-07-31 DIAGNOSIS — G894 Chronic pain syndrome: Secondary | ICD-10-CM | POA: Diagnosis not present

## 2021-07-31 DIAGNOSIS — M170 Bilateral primary osteoarthritis of knee: Secondary | ICD-10-CM | POA: Diagnosis not present

## 2021-07-31 DIAGNOSIS — I10 Essential (primary) hypertension: Secondary | ICD-10-CM | POA: Diagnosis not present

## 2021-08-01 ENCOUNTER — Telehealth: Payer: Self-pay

## 2021-08-01 NOTE — Telephone Encounter (Signed)
[facility]  Physical Therapist Caprice Red called:   Seeking in-home PT, twice a week for one week, then once a week for 4 weeks. Focus on transfers, ADL's, and pain control.  Ok given. Call back phone [phone].

## 2021-08-02 DIAGNOSIS — J449 Chronic obstructive pulmonary disease, unspecified: Secondary | ICD-10-CM | POA: Diagnosis not present

## 2021-08-02 DIAGNOSIS — M4802 Spinal stenosis, cervical region: Secondary | ICD-10-CM | POA: Diagnosis not present

## 2021-08-02 DIAGNOSIS — G894 Chronic pain syndrome: Secondary | ICD-10-CM | POA: Diagnosis not present

## 2021-08-02 DIAGNOSIS — I10 Essential (primary) hypertension: Secondary | ICD-10-CM | POA: Diagnosis not present

## 2021-08-02 DIAGNOSIS — M170 Bilateral primary osteoarthritis of knee: Secondary | ICD-10-CM | POA: Diagnosis not present

## 2021-08-02 DIAGNOSIS — M1611 Unilateral primary osteoarthritis, right hip: Secondary | ICD-10-CM | POA: Diagnosis not present

## 2021-08-06 DIAGNOSIS — I10 Essential (primary) hypertension: Secondary | ICD-10-CM | POA: Diagnosis not present

## 2021-08-06 DIAGNOSIS — M1611 Unilateral primary osteoarthritis, right hip: Secondary | ICD-10-CM | POA: Diagnosis not present

## 2021-08-06 DIAGNOSIS — M4802 Spinal stenosis, cervical region: Secondary | ICD-10-CM | POA: Diagnosis not present

## 2021-08-06 DIAGNOSIS — G894 Chronic pain syndrome: Secondary | ICD-10-CM | POA: Diagnosis not present

## 2021-08-06 DIAGNOSIS — M170 Bilateral primary osteoarthritis of knee: Secondary | ICD-10-CM | POA: Diagnosis not present

## 2021-08-06 DIAGNOSIS — J449 Chronic obstructive pulmonary disease, unspecified: Secondary | ICD-10-CM | POA: Diagnosis not present

## 2021-08-08 DIAGNOSIS — M1611 Unilateral primary osteoarthritis, right hip: Secondary | ICD-10-CM | POA: Diagnosis not present

## 2021-08-08 DIAGNOSIS — G894 Chronic pain syndrome: Secondary | ICD-10-CM | POA: Diagnosis not present

## 2021-08-08 DIAGNOSIS — I10 Essential (primary) hypertension: Secondary | ICD-10-CM | POA: Diagnosis not present

## 2021-08-08 DIAGNOSIS — M4802 Spinal stenosis, cervical region: Secondary | ICD-10-CM | POA: Diagnosis not present

## 2021-08-08 DIAGNOSIS — M170 Bilateral primary osteoarthritis of knee: Secondary | ICD-10-CM | POA: Diagnosis not present

## 2021-08-08 DIAGNOSIS — J449 Chronic obstructive pulmonary disease, unspecified: Secondary | ICD-10-CM | POA: Diagnosis not present

## 2021-08-09 DIAGNOSIS — M1611 Unilateral primary osteoarthritis, right hip: Secondary | ICD-10-CM | POA: Diagnosis not present

## 2021-08-09 DIAGNOSIS — J449 Chronic obstructive pulmonary disease, unspecified: Secondary | ICD-10-CM | POA: Diagnosis not present

## 2021-08-09 DIAGNOSIS — M170 Bilateral primary osteoarthritis of knee: Secondary | ICD-10-CM | POA: Diagnosis not present

## 2021-08-09 DIAGNOSIS — M4802 Spinal stenosis, cervical region: Secondary | ICD-10-CM | POA: Diagnosis not present

## 2021-08-09 DIAGNOSIS — I10 Essential (primary) hypertension: Secondary | ICD-10-CM | POA: Diagnosis not present

## 2021-08-09 DIAGNOSIS — G894 Chronic pain syndrome: Secondary | ICD-10-CM | POA: Diagnosis not present

## 2021-08-13 ENCOUNTER — Other Ambulatory Visit: Payer: Self-pay

## 2021-08-13 ENCOUNTER — Encounter: Payer: Self-pay | Admitting: Physical Medicine & Rehabilitation

## 2021-08-13 ENCOUNTER — Encounter: Payer: Medicare Other | Attending: Physical Medicine & Rehabilitation | Admitting: Physical Medicine & Rehabilitation

## 2021-08-13 VITALS — BP 123/72 | HR 68 | Temp 98.0°F | Ht 60.0 in | Wt 146.0 lb

## 2021-08-13 DIAGNOSIS — M170 Bilateral primary osteoarthritis of knee: Secondary | ICD-10-CM

## 2021-08-13 MED ORDER — HYDROCODONE-ACETAMINOPHEN 5-325 MG PO TABS: ORAL_TABLET | ORAL | 0 refills | Status: DC

## 2021-08-13 MED ORDER — GABAPENTIN 300 MG PO CAPS: ORAL_CAPSULE | ORAL | 3 refills | Status: DC

## 2021-08-13 NOTE — Patient Instructions (Signed)
Hydrocodone '5mg'$   at 8am and noon Take 2 tyelnol at bedtime Take gabapentin 3 times a day  Stop meloxicam
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
## 2021-08-20 ENCOUNTER — Other Ambulatory Visit: Payer: Self-pay | Admitting: Internal Medicine

## 2021-08-21 ENCOUNTER — Telehealth: Payer: Self-pay

## 2021-08-21 DIAGNOSIS — M4802 Spinal stenosis, cervical region: Secondary | ICD-10-CM | POA: Diagnosis not present

## 2021-08-21 DIAGNOSIS — J449 Chronic obstructive pulmonary disease, unspecified: Secondary | ICD-10-CM | POA: Diagnosis not present

## 2021-08-21 DIAGNOSIS — M170 Bilateral primary osteoarthritis of knee: Secondary | ICD-10-CM | POA: Diagnosis not present

## 2021-08-21 DIAGNOSIS — G894 Chronic pain syndrome: Secondary | ICD-10-CM | POA: Diagnosis not present

## 2021-08-21 DIAGNOSIS — M1611 Unilateral primary osteoarthritis, right hip: Secondary | ICD-10-CM | POA: Diagnosis not present

## 2021-08-21 DIAGNOSIS — I10 Essential (primary) hypertension: Secondary | ICD-10-CM | POA: Diagnosis not present

## 2021-08-21 NOTE — Telephone Encounter (Signed)
Lisa Larson,  I agree with In-Home -Medication Management, we will await message from [facility].

## 2021-08-21 NOTE — Telephone Encounter (Signed)
Please advise Dr. Ella Larson is out of the office.  Lisa Larson OT with [facility] Call:    Lisa Larson is having difficulty with following her medication directions. I gave verbal approval for an in-home medication management visit.  Because her daughter who usually handles the medication is out town.   Message left for Lisa Larson to call back with a fax number. So Dr. [name] last office note can be reviewed.

## 2021-08-22 ENCOUNTER — Telehealth: Payer: Self-pay | Admitting: *Deleted

## 2021-08-22 NOTE — Telephone Encounter (Signed)
Lisa Larson called from [place] to report they had tried multiple times to reach Lisa Larson and she is not answering or returning their calls. She has been uncooperative . They are unable to schedule visit.

## 2021-08-22 NOTE — Telephone Encounter (Signed)
Dr. Letta Pate last note marked urgent faxed to [facility] [phone]. Fax number provided by Timmothy Sours

## 2021-08-23 DIAGNOSIS — M170 Bilateral primary osteoarthritis of knee: Secondary | ICD-10-CM | POA: Diagnosis not present

## 2021-08-23 DIAGNOSIS — G894 Chronic pain syndrome: Secondary | ICD-10-CM | POA: Diagnosis not present

## 2021-08-23 DIAGNOSIS — I10 Essential (primary) hypertension: Secondary | ICD-10-CM | POA: Diagnosis not present

## 2021-08-23 DIAGNOSIS — M4802 Spinal stenosis, cervical region: Secondary | ICD-10-CM | POA: Diagnosis not present

## 2021-08-23 DIAGNOSIS — J449 Chronic obstructive pulmonary disease, unspecified: Secondary | ICD-10-CM | POA: Diagnosis not present

## 2021-08-23 DIAGNOSIS — M1611 Unilateral primary osteoarthritis, right hip: Secondary | ICD-10-CM | POA: Diagnosis not present

## 2021-08-27 ENCOUNTER — Ambulatory Visit: Payer: Medicare Other

## 2021-08-29 DIAGNOSIS — Z7951 Long term (current) use of inhaled steroids: Secondary | ICD-10-CM | POA: Diagnosis not present

## 2021-08-29 DIAGNOSIS — M109 Gout, unspecified: Secondary | ICD-10-CM | POA: Diagnosis not present

## 2021-08-29 DIAGNOSIS — F32A Depression, unspecified: Secondary | ICD-10-CM | POA: Diagnosis not present

## 2021-08-29 DIAGNOSIS — M47812 Spondylosis without myelopathy or radiculopathy, cervical region: Secondary | ICD-10-CM | POA: Diagnosis not present

## 2021-08-29 DIAGNOSIS — J449 Chronic obstructive pulmonary disease, unspecified: Secondary | ICD-10-CM | POA: Diagnosis not present

## 2021-08-29 DIAGNOSIS — Z79891 Long term (current) use of opiate analgesic: Secondary | ICD-10-CM | POA: Diagnosis not present

## 2021-08-29 DIAGNOSIS — Z7952 Long term (current) use of systemic steroids: Secondary | ICD-10-CM | POA: Diagnosis not present

## 2021-08-29 DIAGNOSIS — F419 Anxiety disorder, unspecified: Secondary | ICD-10-CM | POA: Diagnosis not present

## 2021-08-29 DIAGNOSIS — G894 Chronic pain syndrome: Secondary | ICD-10-CM | POA: Diagnosis not present

## 2021-08-29 DIAGNOSIS — D649 Anemia, unspecified: Secondary | ICD-10-CM | POA: Diagnosis not present

## 2021-08-29 DIAGNOSIS — M4802 Spinal stenosis, cervical region: Secondary | ICD-10-CM | POA: Diagnosis not present

## 2021-08-29 DIAGNOSIS — M5030 Other cervical disc degeneration, unspecified cervical region: Secondary | ICD-10-CM | POA: Diagnosis not present

## 2021-08-29 DIAGNOSIS — Z791 Long term (current) use of non-steroidal anti-inflammatories (NSAID): Secondary | ICD-10-CM | POA: Diagnosis not present

## 2021-08-29 DIAGNOSIS — E2740 Unspecified adrenocortical insufficiency: Secondary | ICD-10-CM | POA: Diagnosis not present

## 2021-08-29 DIAGNOSIS — Z79899 Other long term (current) drug therapy: Secondary | ICD-10-CM | POA: Diagnosis not present

## 2021-08-29 DIAGNOSIS — M5136 Other intervertebral disc degeneration, lumbar region: Secondary | ICD-10-CM | POA: Diagnosis not present

## 2021-08-29 DIAGNOSIS — M810 Age-related osteoporosis without current pathological fracture: Secondary | ICD-10-CM | POA: Diagnosis not present

## 2021-08-29 DIAGNOSIS — K219 Gastro-esophageal reflux disease without esophagitis: Secondary | ICD-10-CM | POA: Diagnosis not present

## 2021-08-29 DIAGNOSIS — Z87440 Personal history of urinary (tract) infections: Secondary | ICD-10-CM | POA: Diagnosis not present

## 2021-08-29 DIAGNOSIS — Z792 Long term (current) use of antibiotics: Secondary | ICD-10-CM | POA: Diagnosis not present

## 2021-08-29 DIAGNOSIS — M170 Bilateral primary osteoarthritis of knee: Secondary | ICD-10-CM | POA: Diagnosis not present

## 2021-08-29 DIAGNOSIS — M1611 Unilateral primary osteoarthritis, right hip: Secondary | ICD-10-CM | POA: Diagnosis not present

## 2021-08-29 DIAGNOSIS — I10 Essential (primary) hypertension: Secondary | ICD-10-CM | POA: Diagnosis not present

## 2021-08-29 DIAGNOSIS — M4312 Spondylolisthesis, cervical region: Secondary | ICD-10-CM | POA: Diagnosis not present

## 2021-08-29 DIAGNOSIS — Z8701 Personal history of pneumonia (recurrent): Secondary | ICD-10-CM | POA: Diagnosis not present

## 2021-08-30 DIAGNOSIS — I10 Essential (primary) hypertension: Secondary | ICD-10-CM | POA: Diagnosis not present

## 2021-08-30 DIAGNOSIS — J449 Chronic obstructive pulmonary disease, unspecified: Secondary | ICD-10-CM | POA: Diagnosis not present

## 2021-08-30 DIAGNOSIS — M1611 Unilateral primary osteoarthritis, right hip: Secondary | ICD-10-CM | POA: Diagnosis not present

## 2021-08-30 DIAGNOSIS — G894 Chronic pain syndrome: Secondary | ICD-10-CM | POA: Diagnosis not present

## 2021-08-30 DIAGNOSIS — M4802 Spinal stenosis, cervical region: Secondary | ICD-10-CM | POA: Diagnosis not present

## 2021-08-30 DIAGNOSIS — M170 Bilateral primary osteoarthritis of knee: Secondary | ICD-10-CM | POA: Diagnosis not present

## 2021-09-02 ENCOUNTER — Other Ambulatory Visit: Payer: Self-pay | Admitting: Physical Medicine & Rehabilitation

## 2021-09-04 NOTE — Telephone Encounter (Signed)
Placed a call to Ms. Picker, no answer, left message return the call. Regarding [place]. PMP was Reviewed. Dr Letta Pate note was reviewed. Hydrocodone e-scribed today.

## 2021-09-06 ENCOUNTER — Telehealth: Payer: Self-pay

## 2021-09-06 ENCOUNTER — Telehealth: Payer: Self-pay | Admitting: *Deleted

## 2021-09-06 ENCOUNTER — Telehealth: Payer: Self-pay | Admitting: Registered Nurse

## 2021-09-06 NOTE — Telephone Encounter (Signed)
Mrs Jimmy Picket called and says she is returning a call from [place].

## 2021-09-06 NOTE — Telephone Encounter (Signed)
Patient called stating she got a message from the pharmacy that a prescription was ready. She asked what is was and I told her that is was Hydrocodone sent in on 09/04/2021. She states she didn't request that and will not be picking it up at this time due to that she has some left from previous bottle.

## 2021-09-06 NOTE — Telephone Encounter (Signed)
error 

## 2021-09-06 NOTE — Telephone Encounter (Signed)
Return Ms. Mule call, she reports [facility] is coming to her home. She was given [facility] phone number.

## 2021-09-06 NOTE — Telephone Encounter (Signed)
Return Ms. Laury Axon

## 2021-09-13 DIAGNOSIS — M170 Bilateral primary osteoarthritis of knee: Secondary | ICD-10-CM | POA: Diagnosis not present

## 2021-09-13 DIAGNOSIS — I10 Essential (primary) hypertension: Secondary | ICD-10-CM | POA: Diagnosis not present

## 2021-09-13 DIAGNOSIS — G894 Chronic pain syndrome: Secondary | ICD-10-CM | POA: Diagnosis not present

## 2021-09-13 DIAGNOSIS — M4802 Spinal stenosis, cervical region: Secondary | ICD-10-CM | POA: Diagnosis not present

## 2021-09-13 DIAGNOSIS — J449 Chronic obstructive pulmonary disease, unspecified: Secondary | ICD-10-CM | POA: Diagnosis not present

## 2021-09-13 DIAGNOSIS — M1611 Unilateral primary osteoarthritis, right hip: Secondary | ICD-10-CM | POA: Diagnosis not present

## 2021-09-24 ENCOUNTER — Ambulatory Visit: Payer: Medicare Other

## 2021-10-15 ENCOUNTER — Telehealth: Payer: Self-pay | Admitting: *Deleted

## 2021-10-15 ENCOUNTER — Other Ambulatory Visit: Payer: Self-pay

## 2021-10-15 ENCOUNTER — Encounter: Payer: Self-pay | Admitting: Pulmonary Disease

## 2021-10-15 ENCOUNTER — Ambulatory Visit (INDEPENDENT_AMBULATORY_CARE_PROVIDER_SITE_OTHER): Payer: Medicare Other | Admitting: Pulmonary Disease

## 2021-10-15 ENCOUNTER — Telehealth: Payer: Self-pay | Admitting: Pulmonary Disease

## 2021-10-15 VITALS — BP 128/70 | HR 68 | Temp 97.8°F | Ht 60.0 in | Wt 144.0 lb

## 2021-10-15 DIAGNOSIS — J4550 Severe persistent asthma, uncomplicated: Secondary | ICD-10-CM | POA: Diagnosis not present

## 2021-10-15 DIAGNOSIS — R0789 Other chest pain: Secondary | ICD-10-CM

## 2021-10-15 DIAGNOSIS — J8289 Other pulmonary eosinophilia, not elsewhere classified: Secondary | ICD-10-CM | POA: Diagnosis not present

## 2021-10-15 NOTE — Telephone Encounter (Signed)
Pharmacy team please contact patient  about getting financial assistance renewed for nucala.  Thank you.

## 2021-10-15 NOTE — Patient Instructions (Addendum)
Will arrange for referral to cardiologist to assess cause of your intermittent chest pressure  Will have pharmacy team contact you about getting financial assistance renewed for nucala  Follow up in 6 months

## 2021-10-15 NOTE — Progress Notes (Signed)
[facility]  Chief Complaint  Patient presents with   Follow-up    Chest pressure, says she feels like an elephant is sitting on her chest.  Sob is better.    Constitutional:  BP 128/70 (BP Location: Right Arm, Patient Position: Sitting, Cuff Size: Normal)   Pulse 68   Temp 97.8 F (36.6 C) (Oral)   Ht 5' (1.524 m)   Wt 144 lb (65.3 kg)   BMI 28.12 kg/m   Past Medical History:  Anemia, Anxiety, Arthritis, C2 fx, Cataract, Chronic headaches, Depression, GERD, Gout, Hiatal hernia, HTN, Osteoporosis, Urinary incontinence  Past Surgical History:  She  has a past surgical history that includes Shoulder surgery (Left, 08-2008); Total abdominal hysterectomy; Tonsillectomy and adenoidectomy; colonoscopy with polypectomy (06/2013); Nasal sinus surgery; Appendectomy; Esophageal dilation; Bronchoscopy (12-2010); Cataract extraction w/ intraocular lens  implant, bilateral (Bilateral); Knee arthroscopy (Right, 06/18/2015); Tonsillectomy; Cholecystectomy (N/A, 05/23/2016); Upper gastrointestinal endoscopy; Colonoscopy; Polypectomy; and Adenoidectomy.  Brief Summary:  Lisa Larson is a 80 y.o. 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      Subjective:   She gets intermittent episodes of substernal chest pressure.  These come and go.  Happens more when she lays flat.  Not having any issues with her sleep.  Not having cough, wheeze, sputum, or chest congestion.  Hasn't needed to use albuterol.  Didn't know what arnuity was for and never picked it up.  Has been using flonase and this helps.  Not having fever, sweats, palpitations, or reflux.  Physical Exam:   Appearance - well kempt   ENMT - no sinus tenderness, no oral exudate, no LAN, Mallampati 2 airway, no stridor  Respiratory - equal breath sounds bilaterally, no wheezing or rales  CV - s1s2 regular rate and rhythm, no murmurs  Ext -  no clubbing, no edema  Skin - no rashes  Psych - normal mood and affect   Pulmonary testing:  CBC 01/14/11>>39% eosinophils 01/14/11>> HIV negative, ACE 39, RF negative, ANA 1:40, ANCA negative   BAL 01/21/11>>48% Eosinophils   IgE 02/25/11>>282 PFT 11/04/12>>FEV1 1.77 (87%), FEV1% 68, TLC 4.87 (101%), DLCO 69%, +BD from FEF 25-75%. RAST 10/26/17 >> negative, IgE 108 Spirometry 08/09/19 >> FEV1 1.08 (64%), FEV1% 60  Chest Imaging:  CT chest 01/16/11 >> Multifocal b/l ASD with GGO CT chest 07/22/11 >> resolution of ASD CT chest 10/03/11 >> recurrence of nodular infiltrate Rt upper lobe  Social History:  She  reports that she quit smoking about 52 years ago. Her smoking use included cigarettes. She has a 15.00 pack-year smoking history. She has never used smokeless tobacco. She reports that she does not drink alcohol and does not use drugs.  Family History:  Her family history includes Alzheimer's disease in her mother; Arthritis in her father; Cancer in her son; Colitis in her mother; Diabetes in her brother and paternal grandmother; Heart attack in her paternal grandmother; Hypertension in her brother, father, and mother; Irritable bowel syndrome in her mother; Non-Hodgkin's lymphoma in her brother; Other in her son; Pulmonary embolism in her father; Rheum arthritis in her father.     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 - has been on nucala since March 2019 - continue nucala - don't think she needs ICS since she is on chronic prednisone - prn albuterol; discussed when she should use albuterol   Allergic rhinitis. - continue nucala, flonase  Chest pressure. - ECG  showed sinus rhythm today - will arrange for referral to cardiology to further assess for coronary artery disease   Osteoarthritis. - f/u with PCP and rheumatology (Dr. 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 - followed by Dr. Kelton Pillar  with endocrinology - maintained on 5 mg prednisone daily; likely helping with asthma   Time Spent Involved in Patient Care on Day of Examination:  43 minutes  Follow up:   Patient Instructions  Will arrange for referral to cardiologist to assess cause of your intermittent chest pressure  Will have pharmacy team contact you about getting financial assistance renewed for nucala  Follow up in 6 months  Medication List:   Allergies as of 10/15/2021       Reactions   Other Rash, Shortness Of Breath   Sulfonamide Derivatives Shortness Of Breath, Rash   Oxycodone-aspirin Other (See Comments)   Couldn't hear    Diclofenac Other (See Comments)   Unknown reaction  Other reaction(s): Trouble Breathing/Hives   Oxycodone    Other reaction(s): Trouble Breathing   Prolia [denosumab]    Muscle pain, bone pain   Rofecoxib Other (See Comments)   Unknown reaction  Other reaction(s): Hives/Trouble Breathing   Sulfa Antibiotics    Other reaction(s): Trouble Breathing   Ace Inhibitors Cough   Other reaction(s): Trouble Breathing/Hives   Benazepril Hcl Other (See Comments)   No PMH of angioedema; ACE-I caused cough   Tramadol Itching        Medication List        Accurate as of October 15, 2021 12:59 PM. If you have any questions, ask your nurse or doctor.          STOP taking these medications    Arnuity Ellipta 100 MCG/ACT Aepb Generic drug: Fluticasone Furoate Stopped by: [name], MD   cephALEXin 500 MG capsule Commonly known as: KEFLEX Stopped by: [name], MD   meloxicam 7.5 MG tablet Commonly known as: MOBIC Stopped by: [name], MD       TAKE these medications    acetaminophen 500 MG tablet Commonly known as: TYLENOL Take 1,000 mg by mouth 3 (three) times daily as needed.   albuterol 108 (90 Base) MCG/ACT inhaler Commonly known as: ProAir HFA Inhale 2 puffs into the lungs every 6 (six) hours as needed for wheezing or shortness of breath.    calcium carbonate 600 MG tablet Commonly known as: OS-CAL Take 600 mg by mouth 2 (two) times daily.   Cetirizine HCl 10 MG Caps Take 1 capsule (10 mg total) by mouth daily.   chlorhexidine 0.12 % solution Commonly known as: PERIDEX SMARTSIG:By Mouth   famotidine 40 MG tablet Commonly known as: PEPCID Take 1 tablet (40 mg total) by mouth daily as needed for heartburn or indigestion.   fluticasone 50 MCG/ACT nasal spray Commonly known as: FLONASE Place 2 sprays into both nostrils daily.   gabapentin 300 MG capsule Commonly known as: NEURONTIN TAKE 1 CAPSULE(300 MG) BY MOUTH THREE TIMES DAILY   HYDROcodone-acetaminophen 5-325 MG tablet Commonly known as: NORCO/VICODIN TAKE ONE TABLET BY MOUTH EVERY TWELVE HOURS AS NEEDED FOR PAIN   hydrOXYzine 25 MG tablet Commonly known as: ATARAX/VISTARIL TAKE 1 TABLET(25 MG) BY MOUTH AT BEDTIME AS NEEDED FOR SLEEP   LORazepam 1 MG tablet Commonly known as: ATIVAN TAKE 1/2 TABLET BY MOUTH EVERY 8 TO 12 HOURS AS NEEDED   losartan 100 MG tablet Commonly known as: COZAAR TAKE 1 TABLET(100 MG) BY MOUTH DAILY  Nucala 100 MG/ML So[place] Generic drug: Mepolizumab Inject 1 mL (100 mg total) into the skin every 28 (twenty-eight) days. Deliver to patient's home for self-administration.   nystatin-triamcinolone ointment Commonly known as: MYCOLOG Apply 1 application topically 2 (two) times daily.   predniSONE 5 MG tablet Commonly known as: DELTASONE Take 1 tablet (5 mg total) by mouth daily with breakfast. 1 tablet daily, patient has instructions to double up during sick days.   sertraline 100 MG tablet Commonly known as: ZOLOFT TAKE 2 TABLETS(200 MG) BY MOUTH DAILY   thiamine 100 MG tablet Commonly known as: Vitamin B-1 Take 100 mg by mouth daily as needed.   Vitamin D3 50 MCG (2000 UT) capsule Take 2,000 Units by mouth daily.   zinc gluconate 50 MG tablet Take 50 mg by mouth daily.        Signature:  [name], MD [place] Pager - [phone] 10/15/2021, 12:59 PM

## 2021-10-16 NOTE — Telephone Encounter (Signed)
I called the patient and spoke with her and she reports that she does not want a doctor that is "green behind the ears" or is just a resident. She did let me know that she had cancelled the appointment at the appointment.  I have placed a new referral per the patient request and nothing further is needed. Closing encounter.

## 2021-10-16 NOTE — Telephone Encounter (Signed)
Returned call to patient about Nucala PAP application. Will mail enrollment form to her address on file. Advised that [facility] should be mailing application to her by next month.  Will place provider portion in mailbox to be signed  Knox Saliva, PharmD, MPH, BCPS Clinical Pharmacist (Rheumatology and Pulmonology)

## 2021-10-16 NOTE — Telephone Encounter (Addendum)
I have called and LM on VM for the pt 

## 2021-10-16 NOTE — Telephone Encounter (Signed)
Pt states she did research on the doctor she was addigned for heart consult and she didnt want someone who was "just practicing." Wanted me to cancel the appt and to make new one, but was not understanding that we cannot cancel and make appts for offices that are not ours. Hung up on me while I was trying to get a nurse to help further explain.

## 2021-10-16 NOTE — Addendum Note (Signed)
Addended by: Dessie Coma on: 10/16/2021 09:43 AM   Modules accepted: Orders

## 2021-10-18 ENCOUNTER — Encounter: Payer: Medicare Other | Attending: Physical Medicine & Rehabilitation | Admitting: Physical Medicine & Rehabilitation

## 2021-10-18 ENCOUNTER — Other Ambulatory Visit: Payer: Self-pay

## 2021-10-18 ENCOUNTER — Encounter: Payer: Self-pay | Admitting: Physical Medicine & Rehabilitation

## 2021-10-18 VITALS — BP 138/75 | HR 76 | Temp 98.5°F | Ht 60.0 in | Wt 145.0 lb

## 2021-10-18 DIAGNOSIS — M1611 Unilateral primary osteoarthritis, right hip: Secondary | ICD-10-CM | POA: Diagnosis not present

## 2021-10-18 DIAGNOSIS — M170 Bilateral primary osteoarthritis of knee: Secondary | ICD-10-CM | POA: Diagnosis not present

## 2021-10-18 MED ORDER — BUPRENORPHINE 10 MCG/HR TD PTWK: 1.0000 | MEDICATED_PATCH | TRANSDERMAL | 1 refills | Status: DC

## 2021-10-18 MED ORDER — BUPRENORPHINE 10 MCG/HR TD PTWK: 10.0000 | MEDICATED_PATCH | TRANSDERMAL | 1 refills | Status: DC

## 2021-10-18 NOTE — Addendum Note (Signed)
Addended by: Charlett Blake on: 10/18/2021 04:17 PM   Modules accepted: Orders
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
## 2021-10-18 NOTE — Patient Instructions (Signed)
May take Tylenol with the patch.  Change the patch weeklyBuprenorphine Transdermal Patch What is this medication? BUPRENORPHINE (byoo pre NOR feen) treats severe, chronic pain. It is prescribed when other pain medications have not worked or cannot be tolerated. It works by blocking pain signals in the brain. It belongs to a group of medications called opioids. This medication is long-acting. Do not use it to treat sudden pain. This medicine may be used for other purposes; ask your health care provider or pharmacist if you have questions. COMMON BRAND NAME(S): Butrans What should I tell my care team before I take this medication? They need to know if you have any of these conditions: Brain tumor Drug abuse or addiction Gallbladder disease Head injury Heart disease If you often drink alcohol Irregular heartbeat or rhythm Liver disease Low adrenal gland function Lung disease, asthma, or breathing problem Pancreatic disease Seizures Stomach or intestine problems Taken an MAOI like Marplan, Nardil, or Parnate in the last 14 days An unusual or allergic reaction to buprenorphine, other medications, foods, dyes, or preservatives Pregnant or trying to get pregnant Breast-feeding How should I use this medication? This medication is for external use only. Apply the patch to your skin. Select a clean, dry area of skin on your upper outer arm, upper chest, upper back, or the side of the chest. The upper back is a good spot to put the patch on people who are confused because it will be hard for them to remove the patch. Do not apply the patch to oily, broken, burned, cut, or irritated skin. Use only water to clean the area. Do not use soap or alcohol to clean the skin because this can increase the effects of the medication. If the area is hairy, clip the hair with scissors, but do not shave. Do not cut or damage the patch. A cut or damaged patch can be very dangerous because you may get too much  medication. Take the patch out of its wrapper, and take off the protective strip over the sticky part. Do not use a patch if the packaging or backing is damaged. Do not touch the sticky part with your fingers. Press the sticky surface to the skin using the palm of your hand. Press the patch to the skin for 15 seconds. Wash your hands at once with soap and water. Keep patches far away from children. Do not let children see you apply the patch and do not apply it where children can see it. Do not call the patch a sticker, tattoo, or bandage, as this could encourage the child to mimic your actions. Used patches still contain medication. Children or pets can have serious side effects or die from putting used patches in their mouth or on their bodies. Take off the old patch before putting on a new patch. Apply each new patch to a different area of skin. If a patch comes off or causes irritation, remove it and apply a new patch to different site. If the edges of the patch start to loosen, apply first aid tape to the edges of the patch. If problems with the patch not sticking continue, cover the patch with a see-through adhesive dressing (like Bioclusive or Tegaderm). Never cover the patch with any other bandage or tape. A special MedGuide will be given to you by the pharmacist with each prescription and refill. Be sure to read this information carefully each time. This medicine comes with INSTRUCTIONS FOR USE. Ask your pharmacist for directions on  how to use this medicine. Read the information carefully. Talk to your pharmacist or health care provider if you have questions. Talk to your care team regarding the use of this medication in children. Special care may be needed. Overdosage: If you think you have taken too much of this medicine contact a poison control center or emergency room at once. NOTE: This medicine is only for you. Do not share this medicine with others. What if I miss a dose? If you miss a  dose, use it as soon as you can. If it is almost time for your next dose, use only that dose. Do not use double or extra doses. What may interact with this medication? Do not take this medication with any of the following: Cisapride Dronedarone Pimozide Safinamide Samidorphan Thioridazine This medication may also interact with the following: Alcohol Antihistamines for allergy, cough and cold Certain antibiotics like clarithromycin, erythromycin Certain antivirals for hepatitis or HIV Certain medicines for anxiety or sleep Certain medicines for bladder problems like oxybutynin, tolterodine Certain medicines for depression or psychotic disorders Certain medicines for fungal infections like fluconazole, ketoconazole, posaconazole Certain medicines for migraine headache like almotriptan, eletriptan, frovatriptan, naratriptan, rizatriptan, sumatriptan, zolmitriptan Certain medicines for nausea or vomiting like dolasetron, ondansetron, palonosetron Certain medicines for Parkinson's disease like benztropine, trihexyphenidyl Certain medicines for seizures like carbamazepine, phenobarbital, phenytoin Certain medicines for stomach problems like dicyclomine, hyoscyamine Certain medicines for travel sickness like scopolamine Diuretics General anesthetics like halothane, isoflurane, methoxyflurane, propofol Ipratropium Linezolid MAOIs like Carbex, Eldepryl, Marplan, Nardil, and Parnate Medicines that relax muscles like cyclobenzaprine, metaxalone Methylene blue (injected into a vein) Other medications that prolong the QT interval (cause an abnormal heart rhythm) Other narcotic medications for pain or cough Phenothiazines like chlorpromazine, prochlorperazine Rifampin This list may not describe all possible interactions. Give your health care provider a list of all the medicines, herbs, non-prescription drugs, or dietary supplements you use. Also tell them if you smoke, drink alcohol, or use  illegal drugs. Some items may interact with your medicine. What should I watch for while using this medication? Tell your care team if your pain does not go away, if it gets worse, or if you have new or a different type of pain. You may develop tolerance to this medication. Tolerance means that you will need a higher dose of the medication for pain relief. Tolerance is normal and is expected if you take this medication for a long time. Do not suddenly stop taking your medication because you may develop a severe reaction. Your body becomes used to the medication. This does NOT mean you are addicted. Addiction is a behavior related to getting and using a medication for a nonmedical reason. If you have pain, you have a medical reason to take pain medication. Your care team will tell you how much medication to take. If your care team wants you to stop the medication, the dose will be slowly lowered over time to avoid any side effects. If you take other medications that also cause drowsiness such as other narcotic pain medications, benzodiazepines, or other medications for sleep, you may have more side effects. Give your care team a list of all medications you use. They will tell you how much medication to take. Do not take more medication than directed. Call emergency services if you have trouble breathing or are unusually tired or sleepy. Talk to your care team about naloxone and how to get it. Naloxone is an emergency medication used for an opioid overdose.  An overdose can happen if you take too much opioid. It can also happen if an opioid is taken with some other medications or substances, such as alcohol. Know the symptoms of an overdose, such as trouble breathing, unusually tired or sleepy, or not being able to respond or wake up. Make sure to tell caregivers and close contacts where it is stored. Make sure they know how to use it. After naloxone is given, you must call emergency services. Naloxone is a  temporary treatment. Repeat doses may be needed. You may get drowsy or dizzy. Do not drive, use machinery, or do anything that needs mental alertness until you know how this medication affects you. Do not stand up or sit up quickly, especially if you are an older patient. This reduces the risk of dizzy or fainting spells. Alcohol may interfere with the effect of this medication. Avoid alcoholic drinks. This medication will cause constipation. If you do not have a bowel movement for 3 days, call your care team. Your mouth may get dry. Chewing sugarless gum or sucking hard candy and drinking plenty of water may help. Contact your care team if the problem does not go away or is severe. This patch is sensitive to body heat changes. If your skin gets too hot, more medication will come out of the patch and can cause a deadly overdose. Call your care team if you get a fever. Do not take hot baths. Do not sunbathe. Do not use hot tubs, saunas, hairdryers, heating pads, electric blankets, heated waterbeds, or tanning lamps. Do not do exercise that increases your body temperature. If you are going to need surgery, an MRI, CT scan, or other procedure, tell your care team that you are using this medication. You may need to remove the patch before the procedure. What side effects may I notice from receiving this medication? Side effects that you should report to your care team as soon as possible: Allergic reactions-skin rash, itching, hives, swelling of the face, lips, tongue, or throat CNS depression-slow or shallow breathing, shortness of breath, feeling faint, dizziness, confusion, difficulty staying awake Heartbeat rhythm changes-fast or irregular heartbeat, dizziness, feeling faint or lightheaded, chest pain, trouble breathing Liver injury-right upper belly pain, loss of appetite, nausea, light-colored stool, dark yellow or brown urine, yellowing skin or eyes, unusual weakness or fatigue Low adrenal gland  function-nausea, vomiting, loss of appetite, unusual weakness or fatigue, dizziness Low blood pressure-dizziness, feeling faint or lightheaded, blurry vision Side effects that usually do not require medical attention (report to your care team if they continue or are bothersome): Constipation Dizziness Drowsiness Dry mouth Headache Mild skin irritation, itching, redness, or rash at patch application site Nausea Vomiting This list may not describe all possible side effects. Call your doctor for medical advice about side effects. You may report side effects to FDA at 1-800-FDA-1088. Where should I keep my medication? Keep out of the reach of children and pets. This medication can be abused. Keep it in a safe place to protect it from theft. Do not share it with anyone. It is only for you. Selling or giving away this medication is dangerous and against the law. Store at room temperature at 25 degrees C (77 degrees F). Keep this medication in the original packaging until you are ready to take it. Get rid of any unused medication after the expiration date. This medication may cause harm and death if it is taken by other adults, children, or pets. It is important to  get rid of the medication as soon as you no longer need it, or it is expired. You can do this in two ways: Take the medication to a medication take-back program. Check with your pharmacy or law enforcement to find a location. If you cannot return the medication, remove it from the packaging and flush it down the toilet. NOTE: This sheet is a summary. It may not cover all possible information. If you have questions about this medicine, talk to your doctor, pharmacist, or health care provider.  2022 Elsevier/Gold Standard (2021-01-23 13:18:21)

## 2021-10-21 ENCOUNTER — Telehealth: Payer: Self-pay | Admitting: *Deleted

## 2021-10-21 NOTE — Telephone Encounter (Signed)
Prior auth submitted to [facility] via CoverMyMeds for buprenorphine 63mcg/hr patch #4.

## 2021-10-21 NOTE — Telephone Encounter (Signed)
Signed provider portion of [facility] PAP received. Will file in PAP pending info folder in pharmacy office along with insurance card copy and med list  Knox Saliva, PharmD, MPH, BCPS Clinical Pharmacist (Rheumatology and Pulmonology)

## 2021-10-22 ENCOUNTER — Ambulatory Visit: Payer: Medicare Other

## 2021-10-23 NOTE — Telephone Encounter (Signed)
Buprenorphine 10 mcg/hr weekly patch is not is not covered.   Denied by Medicare part D because she has not tried 2 covered drugs to treat her condition.   Medications that maybe covered with a PA:  Fentanyl transdermal patch Hysingla Oral tabs Methadone HCL tab Morphine Sulfate ER tab  Please advise or re-prescribe.

## 2021-10-23 NOTE — Telephone Encounter (Signed)
Spoke with patient and informed her that the patches were denied and she could pay out of pocket. She stated she did not want to use the patch because she researched and found out she shouldn't use it because she has asthma and adrenal insufficiency. She wants to know if there are any other alternatives. Please advise.

## 2021-10-25 NOTE — Telephone Encounter (Signed)
I left a detailed message on her VM per DPR with the information from Dr Letta Pate regarding her options.

## 2021-10-29 ENCOUNTER — Ambulatory Visit: Payer: Medicare Other | Admitting: Internal Medicine

## 2021-10-29 NOTE — Telephone Encounter (Signed)
Received [facility] [employer] PAP renewal application. Mailing patient portion to patient.  Signed provider portion, med list, insurance card copy placed in PAP pending info folder in pharmacy office  Knox Saliva, PharmD, MPH, BCPS Clinical Pharmacist (Rheumatology and Pulmonology)

## 2021-11-05 NOTE — Telephone Encounter (Signed)
Called patient about [facility] PAP. She states she has two forms she received but has not filled out either because she is not feeling well. One form is highlighted (one I sent) and another that is not.  Advised her to complete the other form if it specifically says renewal and is from manufacturer  Provided her with my name and direct office number to contact w questions.  Knox Saliva, PharmD, MPH, BCPS Clinical Pharmacist (Rheumatology and Pulmonology)

## 2021-11-08 ENCOUNTER — Ambulatory Visit: Payer: Medicare Other | Admitting: Cardiology

## 2021-11-12 ENCOUNTER — Telehealth: Payer: Self-pay

## 2021-11-12 NOTE — Telephone Encounter (Signed)
Patient called to see what type of procedure she is getting at her appointment on 12/10/21. Informed patient that she will be getting a right hip injection that is ultrasound guided. Verbalized understanding.

## 2021-11-26 ENCOUNTER — Ambulatory Visit: Payer: Medicare Other

## 2021-11-28 ENCOUNTER — Ambulatory Visit (INDEPENDENT_AMBULATORY_CARE_PROVIDER_SITE_OTHER): Payer: Medicare Other | Admitting: Cardiology

## 2021-11-28 ENCOUNTER — Ambulatory Visit: Payer: Medicare Other | Admitting: Cardiology

## 2021-11-28 ENCOUNTER — Other Ambulatory Visit: Payer: Self-pay

## 2021-11-28 ENCOUNTER — Encounter: Payer: Self-pay | Admitting: Cardiology

## 2021-11-28 VITALS — BP 110/70 | HR 78 | Ht 60.0 in | Wt 147.0 lb

## 2021-11-28 DIAGNOSIS — R0789 Other chest pain: Secondary | ICD-10-CM | POA: Diagnosis not present

## 2021-11-28 DIAGNOSIS — I10 Essential (primary) hypertension: Secondary | ICD-10-CM | POA: Diagnosis not present

## 2021-11-28 NOTE — Patient Instructions (Addendum)
Medication Instructions:  Your physician recommends that you continue on your current medications as directed. Please refer to the Current Medication list given to you today.  *If you need a refill on your cardiac medications before your next appointment, please call your pharmacy*   Lab Work: None If you have labs (blood work) drawn today and your tests are completely normal, you will receive your results only by: [facility] (if you have MyChart) OR A paper copy in the mail If you have any lab test that is abnormal or we need to change your treatment, we will call you to review the results.   Testing/Procedures: Your physician has requested that you have a lexiscan myoview. For further information please visit [email or web address]. Please follow instruction sheet, as given.   The test will take approximately 3 to 4 hours to complete; you may bring reading material.  If someone comes with you to your appointment, they will need to remain in the main lobby due to limited space in the testing area. **If you are pregnant or breastfeeding, please notify the nuclear lab prior to your appointment**  How to prepare for your Myocardial Perfusion Test: Do not eat or drink 3 hours prior to your test, except you may have water. Do not consume products containing caffeine (regular or decaffeinated) 12 hours prior to your test. (ex: coffee, chocolate, sodas, tea). Do bring a list of your current medications with you.  If not listed below, you may take your medications as normal. Do wear comfortable clothes (no dresses or overalls) and walking shoes, tennis shoes preferred (No heels or open toe shoes are allowed). Do NOT wear cologne, perfume, aftershave, or lotions (deodorant is allowed). If these instructions are not followed, your test will have to be rescheduled.    Follow-Up: At [facility], you and your health needs are our priority.  As part of our continuing mission to provide you with  exceptional heart care, we have created designated Provider Care Teams.  These Care Teams include your primary Cardiologist (physician) and Advanced Practice Providers (APPs -  Physician Assistants and Nurse Practitioners) who all work together to provide you with the care you need, when you need it.  We recommend signing up for the patient portal called "MyChart".  Sign up information is provided on this After Visit Summary.  MyChart is used to connect with patients for Virtual Visits (Telemedicine).  Patients are able to view lab/test results, encounter notes, upcoming appointments, etc.  Non-urgent messages can be sent to your provider as well.   To learn more about what you can do with MyChart, go to [email or web address].    Your next appointment:   1 year(s)  The format for your next appointment:   In Person  Provider:   Berniece Salines, DO   Other Instructions
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
## 2021-11-29 NOTE — Telephone Encounter (Signed)
Submitted Patient Assistance Application to [facility] [place] for NUCALA along with provider portion, PA and income documents. Will update patient when we receive a response.  Fax# [phone] Phone# [phone] 

## 2021-12-02 ENCOUNTER — Other Ambulatory Visit: Payer: Self-pay | Admitting: Internal Medicine

## 2021-12-03 ENCOUNTER — Telehealth (HOSPITAL_COMMUNITY): Payer: Self-pay | Admitting: Cardiology

## 2021-12-03 ENCOUNTER — Other Ambulatory Visit: Payer: Self-pay | Admitting: Internal Medicine

## 2021-12-03 NOTE — Telephone Encounter (Signed)
I spoke with patient and she does not wish to schedule Myoview at this time and will call me back at a later date to reschedule.  Order will be removed from the active [facility] and when she calls back to reschedule we will reinstate the order. Thank you.

## 2021-12-05 ENCOUNTER — Other Ambulatory Visit: Payer: Self-pay

## 2021-12-05 MED ORDER — LOSARTAN POTASSIUM 100 MG PO TABS: ORAL_TABLET | ORAL | 0 refills | Status: DC

## 2021-12-06 ENCOUNTER — Telehealth: Payer: Self-pay | Admitting: *Deleted

## 2021-12-06 MED ORDER — BUPRENORPHINE 10 MCG/HR TD PTWK: 1.0000 | MEDICATED_PATCH | TRANSDERMAL | 1 refills | Status: DC

## 2021-12-06 NOTE — Telephone Encounter (Signed)
Appeal faxed to [facility] for Buprenorphine 10 mcg/hr #4.

## 2021-12-06 NOTE — Addendum Note (Signed)
Addended by: Charlett Blake on: 12/06/2021 10:11 AM   Modules accepted: Orders

## 2021-12-09 NOTE — Telephone Encounter (Signed)
Approval received for [place] 48mcg/hr from [place]. Approved from 10/21/21 until further notice. Auth number is [id number]. Pharmacy notified.

## 2021-12-10 ENCOUNTER — Encounter: Payer: Medicare Other | Admitting: Physical Medicine & Rehabilitation

## 2021-12-24 ENCOUNTER — Ambulatory Visit: Payer: Medicare Other

## 2022-01-03 DIAGNOSIS — S12112A Nondisplaced Type II dens fracture, initial encounter for closed fracture: Secondary | ICD-10-CM | POA: Diagnosis not present

## 2022-01-03 DIAGNOSIS — I10 Essential (primary) hypertension: Secondary | ICD-10-CM | POA: Diagnosis not present

## 2022-01-08 NOTE — Telephone Encounter (Signed)
Per [facility] [place], patient's Nucala PAP renewal application has been received and is being reviewed. Application is complete, but may take additional business days to process   Knox Saliva, PharmD, MPH, BCPS Clinical Pharmacist (Rheumatology and Pulmonology)

## 2022-01-09 NOTE — Telephone Encounter (Signed)
Returned call to pt, explained that [facility] is currently not providing status updates for individual applications. Pt states her next shot will be due on either the 20th or 21st. I informed her that, worst case scenario, we would ger her set up with a sample so she will not go without. Pt verbalized understanding.  Will continue to f/u.

## 2022-01-14 ENCOUNTER — Encounter: Payer: Self-pay | Admitting: Pulmonary Disease

## 2022-01-15 NOTE — Telephone Encounter (Signed)
Per rep at [facility] [place], PAP renewal application is still processing.  Patient to stop by clinic tomorrow, 01/16/22 to pick up sample since she is due for dose now.  Knox Saliva, PharmD, MPH, BCPS Clinical Pharmacist (Rheumatology and Pulmonology)

## 2022-01-15 NOTE — Telephone Encounter (Signed)
Please see message below from patient   I still haven't  heard from nucala people about my shot due 28 days from Dec.21st 2022.  Thanks, Lisa Larson  [phone].

## 2022-01-16 ENCOUNTER — Telehealth: Payer: Self-pay | Admitting: Pharmacist

## 2022-01-16 NOTE — Telephone Encounter (Signed)
Patient's son picked up Nucala this morning. Called patient - she had no further questions or concerns at this time  Knox Saliva, PharmD, MPH, BCPS Clinical Pharmacist (Rheumatology and Pulmonology)

## 2022-01-20 NOTE — Telephone Encounter (Signed)
Received a fax from  [place] to [place] regarding an approval for [place] patient assistance from 01/20/22 to 12/28/22.   Phone number: [phone]  Knox Saliva, PharmD, MPH, BCPS Clinical Pharmacist (Rheumatology and Pulmonology)

## 2022-01-28 ENCOUNTER — Ambulatory Visit: Payer: Medicare Other

## 2022-02-25 ENCOUNTER — Ambulatory Visit: Payer: Medicare Other

## 2022-03-03 ENCOUNTER — Emergency Department (HOSPITAL_COMMUNITY): Payer: Medicare Other

## 2022-03-03 ENCOUNTER — Inpatient Hospital Stay (HOSPITAL_COMMUNITY)
Admission: EM | Admit: 2022-03-03 | Discharge: 2022-03-08 | DRG: 481 | Disposition: A | Discharge status: Skilled Nursing Facility | Payer: Medicare Other | Attending: Family Medicine | Admitting: Family Medicine

## 2022-03-03 ENCOUNTER — Encounter (HOSPITAL_COMMUNITY): Payer: Self-pay

## 2022-03-03 DIAGNOSIS — F05 Delirium due to known physiological condition: Secondary | ICD-10-CM | POA: Diagnosis present

## 2022-03-03 DIAGNOSIS — M5136 Other intervertebral disc degeneration, lumbar region: Secondary | ICD-10-CM | POA: Diagnosis not present

## 2022-03-03 DIAGNOSIS — Y92009 Unspecified place in unspecified non-institutional (private) residence as the place of occurrence of the external cause: Secondary | ICD-10-CM | POA: Diagnosis not present

## 2022-03-03 DIAGNOSIS — M1611 Unilateral primary osteoarthritis, right hip: Secondary | ICD-10-CM | POA: Diagnosis not present

## 2022-03-03 DIAGNOSIS — M1990 Unspecified osteoarthritis, unspecified site: Secondary | ICD-10-CM | POA: Diagnosis not present

## 2022-03-03 DIAGNOSIS — S72141A Displaced intertrochanteric fracture of right femur, initial encounter for closed fracture: Principal | ICD-10-CM

## 2022-03-03 DIAGNOSIS — J449 Chronic obstructive pulmonary disease, unspecified: Secondary | ICD-10-CM | POA: Diagnosis not present

## 2022-03-03 DIAGNOSIS — E2740 Unspecified adrenocortical insufficiency: Secondary | ICD-10-CM | POA: Diagnosis present

## 2022-03-03 DIAGNOSIS — M6281 Muscle weakness (generalized): Secondary | ICD-10-CM | POA: Diagnosis not present

## 2022-03-03 DIAGNOSIS — F0392 Unspecified dementia, unspecified severity, with psychotic disturbance: Secondary | ICD-10-CM | POA: Diagnosis present

## 2022-03-03 DIAGNOSIS — Z807 Family history of other malignant neoplasms of lymphoid, hematopoietic and related tissues: Secondary | ICD-10-CM

## 2022-03-03 DIAGNOSIS — I700 Atherosclerosis of aorta: Secondary | ICD-10-CM | POA: Diagnosis present

## 2022-03-03 DIAGNOSIS — I480 Paroxysmal atrial fibrillation: Secondary | ICD-10-CM | POA: Diagnosis present

## 2022-03-03 DIAGNOSIS — G3184 Mild cognitive impairment, so stated: Secondary | ICD-10-CM | POA: Diagnosis not present

## 2022-03-03 DIAGNOSIS — D72829 Elevated white blood cell count, unspecified: Secondary | ICD-10-CM

## 2022-03-03 DIAGNOSIS — Z882 Allergy status to sulfonamides status: Secondary | ICD-10-CM

## 2022-03-03 DIAGNOSIS — R000 Tachycardia, unspecified: Secondary | ICD-10-CM | POA: Diagnosis not present

## 2022-03-03 DIAGNOSIS — Z79899 Other long term (current) drug therapy: Secondary | ICD-10-CM | POA: Diagnosis not present

## 2022-03-03 DIAGNOSIS — Z87891 Personal history of nicotine dependence: Secondary | ICD-10-CM

## 2022-03-03 DIAGNOSIS — J309 Allergic rhinitis, unspecified: Secondary | ICD-10-CM | POA: Diagnosis not present

## 2022-03-03 DIAGNOSIS — F0393 Unspecified dementia, unspecified severity, with mood disturbance: Secondary | ICD-10-CM | POA: Diagnosis present

## 2022-03-03 DIAGNOSIS — Z01810 Encounter for preprocedural cardiovascular examination: Secondary | ICD-10-CM | POA: Diagnosis not present

## 2022-03-03 DIAGNOSIS — Z1159 Encounter for screening for other viral diseases: Secondary | ICD-10-CM | POA: Diagnosis not present

## 2022-03-03 DIAGNOSIS — W010XXA Fall on same level from slipping, tripping and stumbling without subsequent striking against object, initial encounter: Secondary | ICD-10-CM | POA: Diagnosis present

## 2022-03-03 DIAGNOSIS — I4891 Unspecified atrial fibrillation: Secondary | ICD-10-CM

## 2022-03-03 DIAGNOSIS — Z885 Allergy status to narcotic agent status: Secondary | ICD-10-CM

## 2022-03-03 DIAGNOSIS — R4182 Altered mental status, unspecified: Secondary | ICD-10-CM | POA: Diagnosis not present

## 2022-03-03 DIAGNOSIS — Z7401 Bed confinement status: Secondary | ICD-10-CM | POA: Diagnosis not present

## 2022-03-03 DIAGNOSIS — M7989 Other specified soft tissue disorders: Secondary | ICD-10-CM | POA: Diagnosis not present

## 2022-03-03 DIAGNOSIS — Z820 Family history of epilepsy and other diseases of the nervous system: Secondary | ICD-10-CM

## 2022-03-03 DIAGNOSIS — R079 Chest pain, unspecified: Secondary | ICD-10-CM | POA: Diagnosis not present

## 2022-03-03 DIAGNOSIS — Z8781 Personal history of (healed) traumatic fracture: Secondary | ICD-10-CM

## 2022-03-03 DIAGNOSIS — I2510 Atherosclerotic heart disease of native coronary artery without angina pectoris: Secondary | ICD-10-CM | POA: Diagnosis not present

## 2022-03-03 DIAGNOSIS — Z833 Family history of diabetes mellitus: Secondary | ICD-10-CM | POA: Diagnosis not present

## 2022-03-03 DIAGNOSIS — F32A Depression, unspecified: Secondary | ICD-10-CM | POA: Diagnosis not present

## 2022-03-03 DIAGNOSIS — Z20822 Contact with and (suspected) exposure to covid-19: Secondary | ICD-10-CM | POA: Diagnosis present

## 2022-03-03 DIAGNOSIS — S72141D Displaced intertrochanteric fracture of right femur, subsequent encounter for closed fracture with routine healing: Secondary | ICD-10-CM | POA: Diagnosis not present

## 2022-03-03 DIAGNOSIS — S72144A Nondisplaced intertrochanteric fracture of right femur, initial encounter for closed fracture: Secondary | ICD-10-CM

## 2022-03-03 DIAGNOSIS — J8281 Chronic eosinophilic pneumonia: Secondary | ICD-10-CM | POA: Diagnosis present

## 2022-03-03 DIAGNOSIS — M109 Gout, unspecified: Secondary | ICD-10-CM | POA: Diagnosis not present

## 2022-03-03 DIAGNOSIS — Z7952 Long term (current) use of systemic steroids: Secondary | ICD-10-CM

## 2022-03-03 DIAGNOSIS — Z9842 Cataract extraction status, left eye: Secondary | ICD-10-CM

## 2022-03-03 DIAGNOSIS — Z888 Allergy status to other drugs, medicaments and biological substances status: Secondary | ICD-10-CM

## 2022-03-03 DIAGNOSIS — Z8249 Family history of ischemic heart disease and other diseases of the circulatory system: Secondary | ICD-10-CM | POA: Diagnosis not present

## 2022-03-03 DIAGNOSIS — I10 Essential (primary) hypertension: Secondary | ICD-10-CM | POA: Diagnosis not present

## 2022-03-03 DIAGNOSIS — F0394 Unspecified dementia, unspecified severity, with anxiety: Secondary | ICD-10-CM | POA: Diagnosis not present

## 2022-03-03 DIAGNOSIS — S7290XA Unspecified fracture of unspecified femur, initial encounter for closed fracture: Principal | ICD-10-CM | POA: Diagnosis present

## 2022-03-03 DIAGNOSIS — J45998 Other asthma: Secondary | ICD-10-CM | POA: Diagnosis present

## 2022-03-03 DIAGNOSIS — F3289 Other specified depressive episodes: Secondary | ICD-10-CM | POA: Diagnosis not present

## 2022-03-03 DIAGNOSIS — F411 Generalized anxiety disorder: Secondary | ICD-10-CM | POA: Diagnosis not present

## 2022-03-03 DIAGNOSIS — Z881 Allergy status to other antibiotic agents status: Secondary | ICD-10-CM

## 2022-03-03 DIAGNOSIS — R41841 Cognitive communication deficit: Secondary | ICD-10-CM | POA: Diagnosis not present

## 2022-03-03 DIAGNOSIS — M25551 Pain in right hip: Secondary | ICD-10-CM | POA: Diagnosis not present

## 2022-03-03 DIAGNOSIS — G319 Degenerative disease of nervous system, unspecified: Secondary | ICD-10-CM | POA: Diagnosis not present

## 2022-03-03 DIAGNOSIS — M810 Age-related osteoporosis without current pathological fracture: Secondary | ICD-10-CM | POA: Diagnosis not present

## 2022-03-03 DIAGNOSIS — Z8261 Family history of arthritis: Secondary | ICD-10-CM

## 2022-03-03 DIAGNOSIS — F419 Anxiety disorder, unspecified: Secondary | ICD-10-CM

## 2022-03-03 DIAGNOSIS — K219 Gastro-esophageal reflux disease without esophagitis: Secondary | ICD-10-CM | POA: Diagnosis not present

## 2022-03-03 DIAGNOSIS — M25572 Pain in left ankle and joints of left foot: Secondary | ICD-10-CM | POA: Diagnosis not present

## 2022-03-03 DIAGNOSIS — Z886 Allergy status to analgesic agent status: Secondary | ICD-10-CM

## 2022-03-03 DIAGNOSIS — R443 Hallucinations, unspecified: Secondary | ICD-10-CM

## 2022-03-03 DIAGNOSIS — Z90710 Acquired absence of both cervix and uterus: Secondary | ICD-10-CM | POA: Diagnosis not present

## 2022-03-03 DIAGNOSIS — Z9049 Acquired absence of other specified parts of digestive tract: Secondary | ICD-10-CM

## 2022-03-03 DIAGNOSIS — M170 Bilateral primary osteoarthritis of knee: Secondary | ICD-10-CM | POA: Diagnosis not present

## 2022-03-03 DIAGNOSIS — E46 Unspecified protein-calorie malnutrition: Secondary | ICD-10-CM | POA: Diagnosis not present

## 2022-03-03 DIAGNOSIS — Z961 Presence of intraocular lens: Secondary | ICD-10-CM | POA: Diagnosis present

## 2022-03-03 DIAGNOSIS — R296 Repeated falls: Secondary | ICD-10-CM | POA: Diagnosis present

## 2022-03-03 DIAGNOSIS — Z9841 Cataract extraction status, right eye: Secondary | ICD-10-CM

## 2022-03-03 DIAGNOSIS — R0902 Hypoxemia: Secondary | ICD-10-CM | POA: Diagnosis not present

## 2022-03-03 DIAGNOSIS — M2550 Pain in unspecified joint: Secondary | ICD-10-CM | POA: Diagnosis not present

## 2022-03-03 DIAGNOSIS — Z419 Encounter for procedure for purposes other than remedying health state, unspecified: Secondary | ICD-10-CM

## 2022-03-03 DIAGNOSIS — D649 Anemia, unspecified: Secondary | ICD-10-CM | POA: Diagnosis not present

## 2022-03-03 LAB — PROTIME-INR
INR: 1 (ref 0.8–1.2)
Prothrombin Time: 13.3 seconds (ref 11.4–15.2)

## 2022-03-03 LAB — COMPREHENSIVE METABOLIC PANEL
ALT: 21 U/L (ref 0–44)
AST: 32 U/L (ref 15–41)
Albumin: 4.2 g/dL (ref 3.5–5.0)
Alkaline Phosphatase: 46 U/L (ref 38–126)
Anion gap: 9 (ref 5–15)
BUN: 19 mg/dL (ref 8–23)
CO2: 26 mmol/L (ref 22–32)
Calcium: 9.5 mg/dL (ref 8.9–10.3)
Chloride: 106 mmol/L (ref 98–111)
Creatinine, Ser: 0.78 mg/dL (ref 0.44–1.00)
GFR, Estimated: >60 mL/min (ref >60)
Glucose, Bld: 127 mg/dL — ABNORMAL HIGH (ref 70–99)
Potassium: 4 mmol/L (ref 3.5–5.1)
Sodium: 141 mmol/L (ref 135–145)
Total Bilirubin: 0.4 mg/dL (ref 0.3–1.2)
Total Protein: 6.9 g/dL (ref 6.5–8.1)

## 2022-03-03 LAB — TSH: TSH: 1.439 u[IU]/mL (ref 0.350–4.500)

## 2022-03-03 LAB — CBC WITH DIFFERENTIAL/PLATELET
Abs Immature Granulocytes: 0.04 10*3/uL (ref 0.00–0.07)
Basophils Absolute: 0 10*3/uL (ref 0.0–0.1)
Basophils Relative: 0 %
Eosinophils Absolute: 0 10*3/uL (ref 0.0–0.5)
Eosinophils Relative: 0 %
HCT: 40.7 % (ref 36.0–46.0)
Hemoglobin: 13.4 g/dL (ref 12.0–15.0)
Immature Granulocytes: 0 %
Lymphocytes Relative: 10 %
Lymphs Abs: 1.1 10*3/uL (ref 0.7–4.0)
MCH: 32.7 pg (ref 26.0–34.0)
MCHC: 32.9 g/dL (ref 30.0–36.0)
MCV: 99.3 fL (ref 80.0–100.0)
Monocytes Absolute: 0.9 10*3/uL (ref 0.1–1.0)
Monocytes Relative: 8 %
Neutro Abs: 9 10*3/uL — ABNORMAL HIGH (ref 1.7–7.7)
Neutrophils Relative %: 82 %
Platelets: 176 10*3/uL (ref 150–400)
RBC: 4.1 MIL/uL (ref 3.87–5.11)
RDW: 13 % (ref 11.5–15.5)
WBC: 11.2 10*3/uL — ABNORMAL HIGH (ref 4.0–10.5)
nRBC: 0 % (ref 0.0–0.2)

## 2022-03-03 LAB — RESP PANEL BY RT-PCR (FLU A&B, COVID) ARPGX2
Influenza A by PCR: NEGATIVE
Influenza B by PCR: NEGATIVE
SARS Coronavirus 2 by RT PCR: NEGATIVE

## 2022-03-03 LAB — ABO/RH: ABO/RH(D): A POS

## 2022-03-03 LAB — TYPE AND SCREEN
ABO/RH(D): A POS
Antibody Screen: NEGATIVE

## 2022-03-03 LAB — MAGNESIUM: Magnesium: 1.9 mg/dL (ref 1.7–2.4)

## 2022-03-03 MED ORDER — SENNA 8.6 MG PO TABS
1.0000 | ORAL_TABLET | Freq: Two times a day (BID) | ORAL | Status: DC
  Administered 2022-03-04 – 2022-03-08 (×8): 8.6 mg via ORAL
  Filled 2022-03-03 (×7): qty 1

## 2022-03-03 MED ORDER — DILTIAZEM LOAD VIA INFUSION
10.0000 mg | Freq: Once | INTRAVENOUS | Status: AC
  Administered 2022-03-03: 10 mg via INTRAVENOUS
  Filled 2022-03-03: qty 10

## 2022-03-03 MED ORDER — FENTANYL CITRATE PF 50 MCG/ML IJ SOSY
50.0000 ug | PREFILLED_SYRINGE | INTRAMUSCULAR | Status: DC | PRN
  Administered 2022-03-03: 50 ug via INTRAVENOUS
  Filled 2022-03-03: qty 1

## 2022-03-03 MED ORDER — PREDNISONE 5 MG PO TABS
5.0000 mg | ORAL_TABLET | Freq: Every day | ORAL | Status: DC
  Administered 2022-03-04 – 2022-03-08 (×5): 5 mg via ORAL
  Filled 2022-03-03 (×6): qty 1

## 2022-03-03 MED ORDER — FENTANYL CITRATE PF 50 MCG/ML IJ SOSY: 25.0000 ug | PREFILLED_SYRINGE | INTRAMUSCULAR | Status: DC | PRN

## 2022-03-03 MED ORDER — SERTRALINE HCL 100 MG PO TABS
200.0000 mg | ORAL_TABLET | Freq: Every day | ORAL | Status: DC
  Administered 2022-03-04 – 2022-03-08 (×5): 200 mg via ORAL
  Filled 2022-03-03: qty 2
  Filled 2022-03-03: qty 4
  Filled 2022-03-03 (×4): qty 2

## 2022-03-03 MED ORDER — FENTANYL CITRATE PF 50 MCG/ML IJ SOSY
25.0000 ug | PREFILLED_SYRINGE | Freq: Four times a day (QID) | INTRAMUSCULAR | Status: DC | PRN
  Administered 2022-03-03 – 2022-03-04 (×3): 25 ug via INTRAVENOUS
  Filled 2022-03-03 (×3): qty 1

## 2022-03-03 MED ORDER — METHOCARBAMOL 500 MG PO TABS
500.0000 mg | ORAL_TABLET | Freq: Four times a day (QID) | ORAL | Status: DC | PRN
  Administered 2022-03-04 – 2022-03-06 (×2): 500 mg via ORAL
  Filled 2022-03-03 (×3): qty 1

## 2022-03-03 MED ORDER — METHOCARBAMOL 1000 MG/10ML IJ SOLN
500.0000 mg | Freq: Four times a day (QID) | INTRAVENOUS | Status: DC | PRN
  Filled 2022-03-03: qty 5

## 2022-03-03 MED ORDER — GABAPENTIN 300 MG PO CAPS
300.0000 mg | ORAL_CAPSULE | Freq: Three times a day (TID) | ORAL | Status: DC
  Administered 2022-03-03 – 2022-03-08 (×13): 300 mg via ORAL
  Filled 2022-03-03 (×14): qty 1

## 2022-03-03 MED ORDER — SODIUM CHLORIDE 0.9 % IV SOLN: INTRAVENOUS | Status: DC

## 2022-03-03 MED ORDER — LORAZEPAM 0.5 MG PO TABS
0.5000 mg | ORAL_TABLET | Freq: Four times a day (QID) | ORAL | Status: DC | PRN
  Administered 2022-03-03 – 2022-03-06 (×8): 0.5 mg via ORAL
  Filled 2022-03-03 (×8): qty 1

## 2022-03-03 MED ORDER — ENOXAPARIN SODIUM 30 MG/0.3ML IJ SOSY
30.0000 mg | PREFILLED_SYRINGE | INTRAMUSCULAR | Status: DC
  Administered 2022-03-03: 30 mg via SUBCUTANEOUS
  Filled 2022-03-03: qty 0.3

## 2022-03-03 MED ORDER — ALBUTEROL SULFATE (2.5 MG/3ML) 0.083% IN NEBU
2.5000 mg | INHALATION_SOLUTION | Freq: Four times a day (QID) | RESPIRATORY_TRACT | Status: DC | PRN
Start: 2022-03-03 — End: 2022-03-08

## 2022-03-03 MED ORDER — ALBUTEROL SULFATE HFA 108 (90 BASE) MCG/ACT IN AERS: 2.0000 | INHALATION_SPRAY | Freq: Four times a day (QID) | RESPIRATORY_TRACT | Status: DC | PRN

## 2022-03-03 MED ORDER — FLUTICASONE PROPIONATE 50 MCG/ACT NA SUSP
2.0000 | Freq: Every day | NASAL | Status: DC
  Administered 2022-03-05 – 2022-03-08 (×4): 2 via NASAL
  Filled 2022-03-03 (×2): qty 16

## 2022-03-03 MED ORDER — LACTATED RINGERS IV SOLN: INTRAVENOUS | Status: DC

## 2022-03-03 MED ORDER — DILTIAZEM HCL-DEXTROSE 125-5 MG/125ML-% IV SOLN (PREMIX)
5.0000 mg/h | INTRAVENOUS | Status: DC
  Administered 2022-03-03: 5 mg/h via INTRAVENOUS
  Administered 2022-03-04 (×2): 10 mg/h via INTRAVENOUS
  Filled 2022-03-03 (×4): qty 125

## 2022-03-03 MED ORDER — DOCUSATE SODIUM 100 MG PO CAPS
100.0000 mg | ORAL_CAPSULE | Freq: Two times a day (BID) | ORAL | Status: DC
  Administered 2022-03-04: 100 mg via ORAL
  Filled 2022-03-03: qty 1

## 2022-03-03 NOTE — Assessment & Plan Note (Addendum)
BP normal ?-Hold Losartan ? ?

## 2022-03-03 NOTE — Hospital Course (Addendum)
Lisa Larson is a 81 y.o. F with HTN, anxiety who presented with fall.  She bent over to pick something from the ground and she lost balance and she landed on her right hip.  ? ?In the ER, X-ray showed right acute intertrochanteric femur fracture.  Patient is also found to have A-fib with RVR ? ? ?3/6: Admitted on Cardizem gtt.  Orthopedics and cardio consulted.   ?3/7: ORIF by Dr. Marlou Sa ?

## 2022-03-03 NOTE — ED Triage Notes (Signed)
Pt arrived via EMS, from home, states diarrhea x1 week after eating fried chicken. Mechanical fall this afternoon, landed on right hip. Pain to right hip. No LOC, no blood thinners.  ?

## 2022-03-03 NOTE — Assessment & Plan Note (Addendum)
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 ?-Continue prednisone  ?

## 2022-03-03 NOTE — Progress Notes (Signed)
Radiographs reviewed ?Acute right intertrochanteric fracture ?Plan surgery sometime likely tomorrow after 6 PM ?N.p.o. after 9 AM ?No DVT prophylaxis until after surgery outside of [facility] ?

## 2022-03-03 NOTE — Assessment & Plan Note (Addendum)
[place] right leg negative for DVT ?- Per orthopedics ?- TTWB only on RLE ?- Aspirin only for DVT PPx ? ? ? ? ?
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
## 2022-03-03 NOTE — ED Provider Notes (Signed)
[facility] DEPT Provider Note   CSN: [id number] Arrival date & time: 03/03/22  1414     History  Chief Complaint  Patient presents with   Fall   Diarrhea    Lisa Larson is a 81 y.o. female.   Fall  Diarrhea Associated symptoms: arthralgias   Patient presents for a mechanical fall this afternoon.  She landed on her right hip.  She has had pain in this area since.  She also endorses diarrhea for the past week.  Per chart review, history includes anxiety, GERD, depression, gout, HTN, asthma.  Fall occurred at approximate 11:30 AM this morning.  Patient bent over to retrieve something off the ground and this caused her to lose her balance.  She does endorse some dizziness prior to falling.  EMS noted atrial fibrillation with RVR on twelve-lead EKGs.  Patient denies any known history of atrial fibrillation.  She does not take any blood thinning medication.  Last meal was just prior to fall at around 11 AM.    Home Medications Prior to Admission medications   Medication Sig Start Date End Date Taking? Authorizing Provider  acetaminophen (TYLENOL) 500 MG tablet Take 1,000 mg by mouth in the morning, at noon, and at bedtime.   Yes [provider]  albuterol (PROAIR HFA) 108 (90 Base) MCG/ACT inhaler Inhale 2 puffs into the lungs every 6 (six) hours as needed for wheezing or shortness of breath. 01/22/21  Yes Lauraine Rinne, NP  calcium carbonate (OS-CAL) 600 MG tablet Take 600 mg by mouth 2 (two) times daily.   Yes [provider]  Cetirizine HCl 10 MG CAPS Take 1 capsule (10 mg total) by mouth daily. Patient taking differently: Take 10 mg by mouth at bedtime. 06/07/20  Yes Burns, Claudina Lick, MD  Cholecalciferol (VITAMIN D3) 2000 UNITS capsule Take 2,000 Units by mouth daily.   Yes [provider]  fluticasone (FLONASE) 50 MCG/ACT nasal spray Place 2 sprays into both nostrils daily. 12/16/17  Yes Marrian Salvage, FNP   gabapentin (NEURONTIN) 300 MG capsule TAKE 1 CAPSULE(300 MG) BY MOUTH THREE TIMES DAILY 08/13/21  Yes Kirsteins, Luanna Salk, MD  LORazepam (ATIVAN) 1 MG tablet TAKE 1/2 TABLET BY MOUTH EVERY 8 TO 12 HOURS AS NEEDED Patient taking differently: Take 0.5 mg by mouth daily as needed for anxiety or sleep. 06/06/21  Yes Burns, Claudina Lick, MD  losartan (COZAAR) 100 MG tablet TAKE 1 TABLET(100 MG) BY MOUTH DAILY Patient taking differently: Take 100 mg by mouth every evening. 12/05/21  Yes Burns, Claudina Lick, MD  Mepolizumab (NUCALA) 100 MG/ML SOAJ Inject 1 mL (100 mg total) into the skin every 28 (twenty-eight) days. Deliver to patient's home for self-administration. 04/29/21  Yes [name], MD  predniSONE (DELTASONE) 5 MG tablet Take 1 tablet (5 mg total) by mouth daily with breakfast. 1 tablet daily, patient has instructions to double up during sick days. 05/23/21  Yes Shamleffer, Melanie Crazier, MD  sertraline (ZOLOFT) 100 MG tablet TAKE 2 TABLETS(200 MG) BY MOUTH DAILY 06/26/21  Yes Burns, Claudina Lick, MD  thiamine (VITAMIN B-1) 100 MG tablet Take 100 mg by mouth daily as needed.    Yes [provider]  zinc gluconate 50 MG tablet Take 50 mg by mouth daily.   Yes [provider]  buprenorphine (BUTRANS) 10 MCG/HR [place] 1 patch onto the skin once a week. Patient not taking: Reported on 03/03/2022 12/06/21   Charlett Blake, MD  famotidine (PEPCID) 40 MG tablet Take 1 tablet (40 mg total) by mouth daily as needed for heartburn or indigestion. Patient not taking: Reported on 03/03/2022 11/26/20   Binnie Rail, MD  hydrOXYzine (ATARAX/VISTARIL) 25 MG tablet TAKE 1 TABLET(25 MG) BY MOUTH AT BEDTIME AS NEEDED FOR SLEEP Patient not taking: Reported on 03/03/2022 01/25/20   Binnie Rail, MD  nystatin-triamcinolone ointment [facility]) Apply 1 application topically 2 (two) times daily. Patient not taking: Reported on 03/03/2022 07/09/18   Binnie Rail, MD      Allergies    Other, Sulfonamide  derivatives, Oxycodone-aspirin, Diclofenac, Oxycodone, Prolia [denosumab], Rofecoxib, Sulfa antibiotics, Ace inhibitors, Benazepril hcl, and Tramadol    Review of Systems   Review of Systems  Gastrointestinal:  Positive for diarrhea.  Musculoskeletal:  Positive for arthralgias and gait problem.  Neurological:  Positive for dizziness.   Physical Exam Updated Vital Signs BP 111/68 (BP Location: Right Arm)    Pulse 86    Temp 98.4 F (36.9 C)    Resp 18    SpO2 95%  Physical Exam Vitals and nursing note reviewed.  Constitutional:      General: She is not in acute distress.    Appearance: Normal appearance. She is well-developed. She is not ill-appearing, toxic-appearing or diaphoretic.  HENT:     Head: Normocephalic and atraumatic.     Right Ear: External ear normal.     Left Ear: External ear normal.     Nose: Nose normal.     Mouth/Throat:     Mouth: Mucous membranes are moist.     Pharynx: Oropharynx is clear.  Eyes:     Extraocular Movements: Extraocular movements intact.     Conjunctiva/sclera: Conjunctivae normal.  Cardiovascular:     Rate and Rhythm: Tachycardia present. Rhythm irregular.     Heart sounds: No murmur heard. Pulmonary:     Effort: Pulmonary effort is normal. No respiratory distress.     Breath sounds: Normal breath sounds. No wheezing or rales.  Abdominal:     Palpations: Abdomen is soft.     Tenderness: There is no abdominal tenderness.  Musculoskeletal:        General: Signs of injury present. No swelling.     Cervical back: Normal range of motion and neck supple. No rigidity.  Skin:    General: Skin is warm and dry.     Capillary Refill: Capillary refill takes less than 2 seconds.     Coloration: Skin is not jaundiced or pale.  Neurological:     General: No focal deficit present.     Mental Status: She is alert and oriented to person, place, and time.     Cranial Nerves: No cranial nerve deficit.     Sensory: No sensory deficit.     Motor: No  weakness.     Coordination: Coordination normal.  Psychiatric:        Mood and Affect: Mood normal.        Behavior: Behavior normal.        Thought Content: Thought content normal.        Judgment: Judgment normal.    ED Results / Procedures / Treatments   Labs (all labs ordered are listed, but only abnormal results are displayed) Labs Reviewed  CBC WITH DIFFERENTIAL/PLATELET - Abnormal; Notable for the following components:      Result Value   WBC 11.2 (*)    Neutro Abs 9.0 (*)    All other components within normal  limits  COMPREHENSIVE METABOLIC PANEL - Abnormal; Notable for the following components:   Glucose, Bld 127 (*)    All other components within normal limits  RESP PANEL BY RT-PCR (FLU A&B, COVID) ARPGX2  PROTIME-INR  MAGNESIUM  TSH  URINALYSIS, ROUTINE W REFLEX MICROSCOPIC  CBC  BASIC METABOLIC PANEL  MAGNESIUM  PHOSPHORUS  TYPE AND SCREEN  ABO/RH    EKG EKG Interpretation  Date/Time:  Monday March 03 2022 16:37:46 EST Ventricular Rate:  111 PR Interval:    QRS Duration: 83 QT Interval:  321 QTC Calculation: 437 R Axis:   86 Text Interpretation: Atrial fibrillation Borderline right axis deviation Confirmed by Godfrey Pick [phone]) on 03/03/2022 4:42:02 PM  Radiology DG Hip Unilat  With Pelvis 2-3 Views Right  Result Date: 03/03/2022 CLINICAL DATA:  Right hip pain.  Fall. EXAM: DG HIP (WITH OR WITHOUT PELVIS) 2-3V RIGHT COMPARISON:  Hip radiographs 03/22/2019 FINDINGS: There is an acute minimally displaced intertrochanteric fracture of the right femur. Advanced right hip osteoarthrosis is noted with severe joint space narrowing, subchondral sclerosis, and marginal osteophytosis. Mild degenerative changes are noted at the left hip. IMPRESSION: Acute intertrochanteric right femur fracture. Electronically Signed   By: Logan Bores M.D.   On: 03/03/2022 15:46    Procedures Procedures    Medications Ordered in ED Medications  lactated ringers infusion (  Intravenous New Bag/Given 03/03/22 1854)  gabapentin (NEURONTIN) capsule 300 mg (300 mg Oral Given 03/03/22 2049)  predniSONE (DELTASONE) tablet 5 mg (has no administration in time range)  sertraline (ZOLOFT) tablet 200 mg (200 mg Oral Not Given 03/03/22 1711)  diltiazem (CARDIZEM) 1 mg/mL load via infusion 10 mg (10 mg Intravenous Bolus from Bag 03/03/22 1637)    And  diltiazem (CARDIZEM) 125 mg in dextrose 5% 125 mL (1 mg/mL) infusion (10 mg/hr Intravenous Rate/Dose Change 03/03/22 1931)  LORazepam (ATIVAN) tablet 0.5 mg (0.5 mg Oral Given 03/03/22 2049)  fluticasone (FLONASE) 50 MCG/ACT nasal spray 2 spray (has no administration in time range)  enoxaparin (LOVENOX) injection 30 mg (30 mg Subcutaneous Given 03/03/22 1853)  docusate sodium (COLACE) capsule 100 mg (100 mg Oral Patient Refused/Not Given 03/03/22 2237)  senna (SENOKOT) tablet 8.6 mg (8.6 mg Oral Patient Refused/Not Given 03/03/22 2054)  methocarbamol (ROBAXIN) tablet 500 mg (has no administration in time range)    Or  methocarbamol (ROBAXIN) 500 mg in dextrose 5 % 50 mL IVPB (has no administration in time range)  albuterol (PROVENTIL) (2.5 MG/3ML) 0.083% nebulizer solution 2.5 mg (has no administration in time range)  fentaNYL (SUBLIMAZE) injection 25 mcg (25 mcg Intravenous Given 03/03/22 2236)  chlorhexidine (HIBICLENS) 4 % liquid 4 application. (has no administration in time range)  povidone-iodine 10 % swab 2 application. (has no administration in time range)  0.9 %  sodium chloride infusion (has no administration in time range)  ceFAZolin (ANCEF) IVPB 2g/100 mL premix (has no administration in time range)  tranexamic acid (CYKLOKAPRON) IVPB 1,000 mg (has no administration in time range)    ED Course/ Medical Decision Making/ A&P                           Medical Decision Making Amount and/or Complexity of Data Reviewed Labs: ordered. Radiology: ordered.  Risk Prescription drug management. Decision regarding  hospitalization.   This patient presents to the ED for concern of fall, this involves an extensive number of treatment options, and is a complaint that carries with it a  high risk of complications and morbidity.  The differential diagnosis includes acute injury   Co morbidities that complicate the patient evaluation  anxiety, GERD, depression, gout, HTN, asthma   Additional history obtained:  Additional history obtained from patient's son External records from outside source obtained and reviewed including EMR   Lab Tests:  I Ordered, and personally interpreted labs.  The pertinent results include: Leukocytosis, consistent with acute traumatic injury, normal electrolytes, normal hemoglobin   Imaging Studies ordered:  I ordered imaging studies including right hip x-ray I independently visualized and interpreted imaging which showed intertrochanteric fracture of right femur I agree with the radiologist interpretation   Cardiac Monitoring:  The patient was maintained on a cardiac monitor.  I personally viewed and interpreted the cardiac monitored which showed an underlying rhythm of: Atrial fibrillation with RVR   Medicines ordered and prescription drug management:  I ordered medication including Cardizem for rate control Reevaluation of the patient after these medicines showed that the patient improved I have reviewed the patients home medicines and have made adjustments as needed   Consultations Obtained:  I requested consultation with the [place] [facility] orthopedic surgery, Dr. Tempie Donning,  and discussed lab and imaging findings as well as pertinent plan - they recommend: Admission to medicine with plans for operative repair   Problem List / ED Course:  Patient is a 81 year old female who presents to the ED following a ground-level fall prior to arrival.  During this fall, she experienced pain in her right hip.  X-ray imaging confirms right intertrochanteric femur fracture.   At rest, patient's pain is controlled.  It is worsened with any movement.  Fentanyl was ordered for analgesia.  EMS noted atrial fibrillation with RVR during transit.  This was also confirmed while in the ED.  Patient denies any history of A-fib.  Cardizem was ordered for rate control.  Given her need for surgery, anticoagulation was not initiated.  I did speak with Dr. Tempie Donning who is on-call for [facility] orthopedic surgery.  His colleague, Dr. Marlou Sa has provided orthopedic care to this patient in the past.  Dr. Tempie Donning stated that patient is to be admitted to medicine and he will speak with his colleagues about operative planning.  Patient's home medications were ordered.  Patient was admitted to medicine for further management.    Social Determinants of Health:  Lives independently  CRITICAL CARE Performed by: Godfrey Pick   Total critical care time: 35 minutes  Critical care time was exclusive of separately billable procedures and treating other patients.  Critical care was necessary to treat or prevent imminent or life-threatening deterioration.  Critical care was time spent personally by me on the following activities: development of treatment plan with patient and/or surrogate as well as nursing, discussions with consultants, evaluation of patient's response to treatment, examination of patient, obtaining history from patient or surrogate, ordering and performing treatments and interventions, ordering and review of laboratory studies, ordering and review of radiographic studies, pulse oximetry and re-evaluation of patient's condition.          Final Clinical Impression(s) / ED Diagnoses Final diagnoses:  Atrial fibrillation with RVR ([place])  Displaced intertrochanteric fracture of right femur, initial encounter for closed fracture [facility])    Rx / DC Orders ED Discharge Orders          Ordered    Amb referral to AFIB Clinic        03/03/22 1617  Godfrey Pick, MD 03/04/22 [phone]

## 2022-03-03 NOTE — Assessment & Plan Note (Addendum)
TSH normal.  Echo without valvular disease.  ? ?CHA2DS2-Vasc 4.  Cardiology recommend DOAC, patient has deferred for now.  Transitioned off Dilt gtt to PO, maintaining rate control ?- Continue diltiazem ?- We have elected NOT to anticoagulate at this time ? ? ? ?

## 2022-03-03 NOTE — Assessment & Plan Note (Addendum)
Continue Zoloft 

## 2022-03-04 ENCOUNTER — Inpatient Hospital Stay (HOSPITAL_COMMUNITY): Payer: Medicare Other | Admitting: Anesthesiology

## 2022-03-04 ENCOUNTER — Inpatient Hospital Stay (HOSPITAL_COMMUNITY): Payer: Medicare Other

## 2022-03-04 ENCOUNTER — Encounter (HOSPITAL_COMMUNITY): Payer: Self-pay | Admitting: Family Medicine

## 2022-03-04 ENCOUNTER — Other Ambulatory Visit: Payer: Self-pay

## 2022-03-04 ENCOUNTER — Telehealth: Payer: Self-pay | Admitting: Orthopedic Surgery

## 2022-03-04 ENCOUNTER — Encounter (HOSPITAL_COMMUNITY)
Admission: EM | Disposition: A | Discharge status: Skilled Nursing Facility | Payer: Self-pay | Source: Home / Self Care | Attending: Family Medicine

## 2022-03-04 DIAGNOSIS — M1990 Unspecified osteoarthritis, unspecified site: Secondary | ICD-10-CM

## 2022-03-04 DIAGNOSIS — J449 Chronic obstructive pulmonary disease, unspecified: Secondary | ICD-10-CM

## 2022-03-04 DIAGNOSIS — I10 Essential (primary) hypertension: Secondary | ICD-10-CM

## 2022-03-04 DIAGNOSIS — S72141A Displaced intertrochanteric fracture of right femur, initial encounter for closed fracture: Secondary | ICD-10-CM

## 2022-03-04 DIAGNOSIS — Z01810 Encounter for preprocedural cardiovascular examination: Secondary | ICD-10-CM

## 2022-03-04 DIAGNOSIS — S72144A Nondisplaced intertrochanteric fracture of right femur, initial encounter for closed fracture: Secondary | ICD-10-CM | POA: Diagnosis not present

## 2022-03-04 DIAGNOSIS — R079 Chest pain, unspecified: Secondary | ICD-10-CM | POA: Diagnosis not present

## 2022-03-04 LAB — CBC
HCT: 35.9 % — ABNORMAL LOW (ref 36.0–46.0)
Hemoglobin: 11.5 g/dL — ABNORMAL LOW (ref 12.0–15.0)
MCH: 32.3 pg (ref 26.0–34.0)
MCHC: 32 g/dL (ref 30.0–36.0)
MCV: 100.8 fL — ABNORMAL HIGH (ref 80.0–100.0)
Platelets: 156 10*3/uL (ref 150–400)
RBC: 3.56 MIL/uL — ABNORMAL LOW (ref 3.87–5.11)
RDW: 12.9 % (ref 11.5–15.5)
WBC: 9 10*3/uL (ref 4.0–10.5)
nRBC: 0 % (ref 0.0–0.2)

## 2022-03-04 LAB — ECHOCARDIOGRAM COMPLETE
AR max vel: 1.16 cm2
AV Area VTI: 1.16 cm2
AV Area mean vel: 1.15 cm2
AV Mean grad: 3 mmHg
AV Peak grad: 4.8 mmHg
Ao pk vel: 1.1 m/s
Calc EF: 58.3 %
S' Lateral: 2 cm
Single Plane A2C EF: 63.6 %
Single Plane A4C EF: 56.3 %
Weight: 2345.69 oz

## 2022-03-04 LAB — BASIC METABOLIC PANEL
Anion gap: 9 (ref 5–15)
BUN: 16 mg/dL (ref 8–23)
CO2: 25 mmol/L (ref 22–32)
Calcium: 9 mg/dL (ref 8.9–10.3)
Chloride: 104 mmol/L (ref 98–111)
Creatinine, Ser: 0.69 mg/dL (ref 0.44–1.00)
GFR, Estimated: >60 mL/min (ref >60)
Glucose, Bld: 97 mg/dL (ref 70–99)
Potassium: 3.5 mmol/L (ref 3.5–5.1)
Sodium: 138 mmol/L (ref 135–145)

## 2022-03-04 LAB — MAGNESIUM: Magnesium: 1.6 mg/dL — ABNORMAL LOW (ref 1.7–2.4)

## 2022-03-04 LAB — SURGICAL PCR SCREEN
MRSA, PCR: NEGATIVE
Staphylococcus aureus: NEGATIVE

## 2022-03-04 LAB — PHOSPHORUS: Phosphorus: 3.2 mg/dL (ref 2.5–4.6)

## 2022-03-04 SURGERY — FIXATION, FRACTURE, INTERTROCHANTERIC, WITH INTRAMEDULLARY ROD
Anesthesia: Spinal | Laterality: Right

## 2022-03-04 MED ORDER — CEFAZOLIN SODIUM-DEXTROSE 2-4 GM/100ML-% IV SOLN
2.0000 g | INTRAVENOUS | Status: AC
  Administered 2022-03-04: 2 g via INTRAVENOUS
  Filled 2022-03-04: qty 100

## 2022-03-04 MED ORDER — CHLORHEXIDINE GLUCONATE 4 % EX LIQD
60.0000 mL | Freq: Once | CUTANEOUS | Status: AC
  Administered 2022-03-04: 4 via TOPICAL
  Filled 2022-03-04: qty 60

## 2022-03-04 MED ORDER — PHENYLEPHRINE HCL (PRESSORS) 10 MG/ML IV SOLN
INTRAVENOUS | Status: AC
  Filled 2022-03-04: qty 1

## 2022-03-04 MED ORDER — ONDANSETRON HCL 4 MG/2ML IJ SOLN
4.0000 mg | Freq: Once | INTRAMUSCULAR | Status: AC | PRN
  Administered 2022-03-04: 4 mg via INTRAVENOUS

## 2022-03-04 MED ORDER — ACETAMINOPHEN 10 MG/ML IV SOLN: 1000.0000 mg | Freq: Once | INTRAVENOUS | Status: DC | PRN

## 2022-03-04 MED ORDER — FENTANYL CITRATE (PF) 100 MCG/2ML IJ SOLN
INTRAMUSCULAR | Status: AC
  Filled 2022-03-04: qty 2

## 2022-03-04 MED ORDER — FENTANYL CITRATE PF 50 MCG/ML IJ SOSY: 25.0000 ug | PREFILLED_SYRINGE | INTRAMUSCULAR | Status: DC | PRN

## 2022-03-04 MED ORDER — CLONIDINE HCL (ANALGESIA) 100 MCG/ML EP SOLN
EPIDURAL | Status: DC | PRN
  Administered 2022-03-04: 1 mL

## 2022-03-04 MED ORDER — LACTATED RINGERS IV SOLN
INTRAVENOUS | Status: DC
Start: 2022-03-04 — End: 2022-03-04

## 2022-03-04 MED ORDER — CHLORHEXIDINE GLUCONATE 0.12 % MT SOLN
15.0000 mL | Freq: Once | OROMUCOSAL | Status: AC
  Administered 2022-03-04: 15 mL via OROMUCOSAL

## 2022-03-04 MED ORDER — BUPIVACAINE HCL (PF) 0.5 % IJ SOLN
INTRAMUSCULAR | Status: DC | PRN
  Administered 2022-03-04: 30 mL

## 2022-03-04 MED ORDER — SODIUM CHLORIDE 0.9 % IV SOLN: INTRAVENOUS | Status: DC

## 2022-03-04 MED ORDER — POVIDONE-IODINE 10 % EX SWAB: 2.0000 "application " | Freq: Once | CUTANEOUS | Status: DC

## 2022-03-04 MED ORDER — TRANEXAMIC ACID-NACL 1000-0.7 MG/100ML-% IV SOLN
1000.0000 mg | INTRAVENOUS | Status: AC
  Administered 2022-03-04: 1000 mg via INTRAVENOUS
  Filled 2022-03-04: qty 100

## 2022-03-04 MED ORDER — PHENYLEPHRINE 40 MCG/ML (10ML) SYRINGE FOR IV PUSH (FOR BLOOD PRESSURE SUPPORT)
PREFILLED_SYRINGE | INTRAVENOUS | Status: DC | PRN
  Administered 2022-03-04 (×3): 80 ug via INTRAVENOUS

## 2022-03-04 MED ORDER — CLONIDINE HCL (ANALGESIA) 100 MCG/ML EP SOLN
EPIDURAL | Status: AC
  Filled 2022-03-04: qty 10

## 2022-03-04 MED ORDER — DEXAMETHASONE SODIUM PHOSPHATE 10 MG/ML IJ SOLN
INTRAMUSCULAR | Status: DC | PRN
  Administered 2022-03-04: 10 mg via INTRAVENOUS

## 2022-03-04 MED ORDER — METHOCARBAMOL 500 MG PO TABS
500.0000 mg | ORAL_TABLET | Freq: Four times a day (QID) | ORAL | Status: DC | PRN
  Filled 2022-03-04: qty 1

## 2022-03-04 MED ORDER — MORPHINE SULFATE (PF) 4 MG/ML IV SOLN
INTRAVENOUS | Status: AC
  Filled 2022-03-04: qty 2

## 2022-03-04 MED ORDER — PHENYLEPHRINE HCL-NACL 20-0.9 MG/250ML-% IV SOLN
INTRAVENOUS | Status: DC | PRN
  Administered 2022-03-04: 40 ug/min via INTRAVENOUS
  Administered 2022-03-04: 80 ug/min via INTRAVENOUS

## 2022-03-04 MED ORDER — PROPOFOL 500 MG/50ML IV EMUL
INTRAVENOUS | Status: DC | PRN
  Administered 2022-03-04 (×8): 20 mg via INTRAVENOUS

## 2022-03-04 MED ORDER — BUPIVACAINE HCL (PF) 0.5 % IJ SOLN
INTRAMUSCULAR | Status: DC | PRN
  Administered 2022-03-04: 12 mg via INTRATHECAL

## 2022-03-04 MED ORDER — LIDOCAINE 2% (20 MG/ML) 5 ML SYRINGE
INTRAMUSCULAR | Status: DC | PRN
  Administered 2022-03-04: 4 mg via INTRAVENOUS

## 2022-03-04 MED ORDER — MORPHINE SULFATE (PF) 4 MG/ML IV SOLN
INTRAVENOUS | Status: DC | PRN
  Administered 2022-03-04: 8 mg

## 2022-03-04 MED ORDER — FENTANYL CITRATE (PF) 100 MCG/2ML IJ SOLN
INTRAMUSCULAR | Status: DC | PRN
  Administered 2022-03-04: 25 ug via INTRAVENOUS

## 2022-03-04 MED ORDER — METHOCARBAMOL 500 MG IVPB - SIMPLE MED
500.0000 mg | Freq: Four times a day (QID) | INTRAVENOUS | Status: DC | PRN
  Filled 2022-03-04: qty 50

## 2022-03-04 MED ORDER — ENSURE SURGERY PO LIQD
237.0000 mL | Freq: Two times a day (BID) | ORAL | Status: DC
  Administered 2022-03-04 – 2022-03-08 (×7): 237 mL via ORAL
  Filled 2022-03-04 (×9): qty 237

## 2022-03-04 MED ORDER — ADULT MULTIVITAMIN W/MINERALS CH
1.0000 | ORAL_TABLET | Freq: Every day | ORAL | Status: DC
Start: 2022-03-04 — End: 2022-03-08
  Administered 2022-03-05 – 2022-03-08 (×4): 1 via ORAL
  Filled 2022-03-04 (×5): qty 1

## 2022-03-04 MED ORDER — MAGNESIUM SULFATE 2 GM/50ML IV SOLN
2.0000 g | Freq: Once | INTRAVENOUS | Status: AC
  Administered 2022-03-04: 2 g via INTRAVENOUS
  Filled 2022-03-04: qty 50

## 2022-03-04 MED ORDER — TRANEXAMIC ACID-NACL 1000-0.7 MG/100ML-% IV SOLN
INTRAVENOUS | Status: AC
  Filled 2022-03-04: qty 100

## 2022-03-04 SURGICAL SUPPLY — 44 items
BAG COUNTER SPONGE SURGICOUNT (BAG) IMPLANT
BAG SPNG CNTER NS LX DISP (BAG)
BIT DRILL INTERTAN LAG SCREW (BIT) ×1 IMPLANT
BLADE SURG 15 STRL LF DISP TIS (BLADE) ×1 IMPLANT
BLADE SURG 15 STRL SS (BLADE) ×2
BNDG GAUZE ELAST 4 BULKY (GAUZE/BANDAGES/DRESSINGS) ×2 IMPLANT
COVER PERINEAL POST (MISCELLANEOUS) ×2 IMPLANT
COVER SURGICAL LIGHT HANDLE (MISCELLANEOUS) ×2 IMPLANT
DRAPE INCISE IOBAN 66X45 STRL (DRAPES) ×1 IMPLANT
DRAPE STERI IOBAN 125X83 (DRAPES) ×2 IMPLANT
DRESSING AQUACEL AG SP 3.5X4 (GAUZE/BANDAGES/DRESSINGS) ×2 IMPLANT
DRESSING AQUACEL AG SP 3.5X6 (GAUZE/BANDAGES/DRESSINGS) ×1 IMPLANT
DRSG AQUACEL AG SP 3.5X4 (GAUZE/BANDAGES/DRESSINGS)
DRSG AQUACEL AG SP 3.5X6 (GAUZE/BANDAGES/DRESSINGS)
DURAPREP 26ML APPLICATOR (WOUND CARE) ×2 IMPLANT
ELECT REM PT RETURN 15FT ADLT (MISCELLANEOUS) ×2 IMPLANT
GAUZE SPONGE 4X4 12PLY STRL (GAUZE/BANDAGES/DRESSINGS) ×1 IMPLANT
GAUZE XEROFORM 1X8 LF (GAUZE/BANDAGES/DRESSINGS) ×2 IMPLANT
GLOVE SRG 8 PF TXTR STRL LF DI (GLOVE) ×1 IMPLANT
GLOVE SURG ORTHO LTX SZ8 (GLOVE) ×2 IMPLANT
GLOVE SURG UNDER POLY LF SZ8 (GLOVE) ×10
GOWN STRL REUS W/TWL LRG LVL3 (GOWN DISPOSABLE) ×5 IMPLANT
GUIDE PIN 3.2X343 (PIN) ×2
GUIDE PIN 3.2X343MM (PIN) ×4
KIT BASIN OR (CUSTOM PROCEDURE TRAY) ×2 IMPLANT
KIT TURNOVER KIT A (KITS) ×1 IMPLANT
MANIFOLD NEPTUNE II (INSTRUMENTS) ×1 IMPLANT
MARKER SKIN DUAL TIP RULER LAB (MISCELLANEOUS) ×2 IMPLANT
NAIL TRIGEN 10MMX36CM-125 RT (Nail) ×1 IMPLANT
NS IRRIG 1000ML POUR BTL (IV SOLUTION) ×2 IMPLANT
PACK GENERAL/GYN (CUSTOM PROCEDURE TRAY) ×2 IMPLANT
PIN GUIDE 3.2X343MM (PIN) IMPLANT
SCREW LAG COMPR KIT 80/75 (Screw) ×1 IMPLANT
SCREW LAG COMPR KIT 90/85 (Screw) ×1 IMPLANT
SOL PREP POV-IOD 4OZ 10% (MISCELLANEOUS) ×1 IMPLANT
SPONGE T-LAP 4X18 ~~LOC~~+RFID (SPONGE) ×2 IMPLANT
STAPLER VISISTAT 35W (STAPLE) ×1 IMPLANT
SUT ETHILON 3 0 PS 1 (SUTURE) ×4 IMPLANT
SUT VIC AB 1 CT1 27 (SUTURE) ×2
SUT VIC AB 1 CT1 27XBRD ANBCTR (SUTURE) IMPLANT
SUT VIC AB 2-0 CT1 27 (SUTURE) ×6
SUT VIC AB 2-0 CT1 TAPERPNT 27 (SUTURE) ×2 IMPLANT
SUT VICRYL 0 UR6 27IN ABS (SUTURE) ×5 IMPLANT
TOWEL OR 17X26 10 PK STRL BLUE (TOWEL DISPOSABLE) ×2 IMPLANT

## 2022-03-04 NOTE — Op Note (Signed)
NAME: [name], [name] ?MEDICAL RECORD NO: [id number] ?ACCOUNT NO: [id number] ?DATE OF BIRTH: [birth year] ?FACILITY: [facility] ?LOCATION: [facility]PERI[facility] ?PHYSICIAN: Yetta Barre. Marlou Sa, MD ? ?Operative Report  ? ?PREOPERATIVE DIAGNOSIS:  Right hip intertrochanteric fracture. ? ?POSTOPERATIVE DIAGNOSIS:  Right hip intertrochanteric fracture. ? ?PROCEDURE:  Right hip intertrochanteric fracture, open reduction internal fixation with [employer] IMHS nail 10 x 36 cm with one compression screw and one lag screw. ? ?SURGEON:  Marcene Duos, M.D. ? ?ASSISTANT:  Annie Main, PA. ? ?INDICATIONS:  Lisa Larson is a 81 year old patient with right hip intertrochanteric fracture, presents for operative management after explanation of risks and benefits. ? ?DESCRIPTION OF PROCEDURE:  The patient was brought to the operating room where spinal anesthetic was induced.  Preoperative antibiotics administered.  Timeout was called.  TXA was utilized.  Left leg was placed in the lithotomy position with the peroneal ? nerve well padded.  Right leg was placed under some traction and internal rotation.  Good fracture reduction was confirmed in the AP and lateral planes under fluoroscopy prior to the procedure. Timeout was called.  Right hip area was prescrubbed with  ?alcohol and Betadine, allowed to air dry. Wall drape was utilized covering everything with [place].  Incision made 4 fingerbreadths proximal to the tip of the trochanter.  Skin and subcutaneous tissue were sharply divided.  Fascia was then divided.  A  ?guide pin placed through the tip of the trochanter in the medial aspect of the tip.  A guide pin placed across into the bone and confirmed placement was good in the AP and lateral planes.  Proximal reaming performed.  In accordance with preoperative  ?templating, a 10 x 36 nail was placed into the femoral canal.  Through a separate incision, a compression screw and locking screw were placed with a tip apex distance of about 5 mm.  And very  good traction was released and we did get about 5 mm of  ?compression across the fracture.  Thorough irrigation of both incisions was performed.  AP and lateral knee fluoroscopy shots demonstrated good placement of the nail.  The incisions were irrigated and closed using #1 Vicryl suture, 0-Vicryl suture and  ?2-0 Vicryl suture, and 3-0 nylon.  It should be noted that we used about 30 mL of plain Marcaine, 1 mL of clonidine and 8 mg of morphine to anesthetize around the skin incisions.  Aquacel dressing was placed.  The patient tolerated the procedure well  ?without immediate complications.  Luke's assistance was required at all times for retraction, opening, closing, mobilization of tissue, putting the nail in.  His assistance was a medical necessity. ? ? ?MUK ?D: 03/04/2022 9:56:54 pm T: 03/04/2022 11:14:00 pm  ?JOB: [id number]/ [id number]  ?

## 2022-03-04 NOTE — Brief Op Note (Signed)
? ?  03/04/2022 ? ?9:52 PM ? ?PATIENT:  [name]  81 y.o. female ? ?PRE-OPERATIVE DIAGNOSIS:  right hip fracture ? ?POST-OPERATIVE DIAGNOSIS:  right hip fracture ? ?PROCEDURE:  Procedure(s): ?INTRAMEDULLARY (IM) NAIL INTERTROCHANTRIC ? ?SURGEON:  Surgeon(s): ?Meredith Pel, MD ? ?ASSISTANT: magnant pa ? ?ANESTHESIA:   spinal ? ?EBL: 50 ml   ? ?Total I/O ?In: 1000 [I.V.:800; IV Piggyback:200] ?Out: 50 [Blood:50] ? ?BLOOD ADMINISTERED: none ? ?DRAINS: none  ? ?LOCAL MEDICATIONS USED: Marcaine morphine clonidine ? ?SPECIMEN:  No Specimen ? ?COUNTS:  YES ? ?TOURNIQUET:  * No tourniquets in log * ? ?DICTATION: .[id number] ?PLAN OF CARE: Admit to inpatient  ? ?PATIENT DISPOSITION:  PACU - hemodynamically stable ? ? ? ? ? ? ? ? ? ? ? ? ?  ?

## 2022-03-04 NOTE — Telephone Encounter (Signed)
I called and talked to her and explained what were going to be doing

## 2022-03-04 NOTE — Progress Notes (Signed)
? ?  Echocardiogram ?2D Echocardiogram has been performed. ? ?Lisa Larson ?03/04/2022, 3:08 PM ?

## 2022-03-04 NOTE — Telephone Encounter (Signed)
Patient's daughter [name] called asked for a call back as soon as possible concerning her mother having surgery today. ?[name] said to let Dr. Marlou Sa know that she is flying home today and would like to know more about her moms surgery.  ? ?[name] said she will be available to take a call from 8:00am to 9:30am and 11:00 to 2:00pm. [name] said she will be in [place] around 3:10pm. The number to contact [name] is [phone] ?

## 2022-03-04 NOTE — Anesthesia Procedure Notes (Signed)
Spinal ? ?Patient location during procedure: OR ?Start time: 03/04/2022 8:11 PM ?End time: 03/04/2022 8:16 PM ?Reason for block: surgical anesthesia ?Staffing ?Performed: anesthesiologist  ?Anesthesiologist: Barnet Glasgow, MD ?Preanesthetic Checklist ?Completed: patient identified, IV checked, risks and benefits discussed, surgical consent, monitors and equipment checked, pre-op evaluation and timeout performed ?Spinal Block ?Patient position: left lateral decubitus ?Prep: DuraPrep and site prepped and draped ?Patient monitoring: heart rate, cardiac monitor, continuous pulse ox and blood pressure ?Approach: midline ?Location: L3-4 ?Injection technique: single-shot ?Needle ?Needle type: Pencan  ?Needle gauge: 24 G ?Needle length: 10 cm ?Needle insertion depth: 6 cm ?Assessment ?Sensory level: T4 ?Events: CSF return ?Additional Notes ? 1 Attempt (s). Pt tolerated procedure well. ? ? ? ?

## 2022-03-04 NOTE — Progress Notes (Signed)
PT Cancellation Note ? ?Patient Details ?Name: Lisa Larson ?MRN: [id number] ?DOB: 1941/11/01 ? ? ?Cancelled Treatment:    Reason Eval/Treat Not Completed: Medical issues which prohibited therapy, scheduled for surgery 03/04/22 ?Tresa Endo PT ?Acute Rehabilitation Services ?Pager [phone] ?Office [phone] ? ? ? ?Claretha Cooper ?03/04/2022, 7:20 AM ?

## 2022-03-04 NOTE — Progress Notes (Signed)
?  Progress Note ? ? ?Patient: EDMUND RICK UJW:[id number] DOB: [birth year] DOA: 03/03/2022     1 ?DOS: the patient was seen and examined on 03/04/2022 ?  ?Brief hospital course: ?This 81 years old female with PMH significant for degenerative arthritis, anxiety disorder, essential hypertension, depression presented in the ED s/p fall at home.  She bent over to pick something from the ground and she lost balance and she landed on her right hip.  X-ray shows right acute intertrochanteric femur fracture.  Patient is also found to have A-fib with RVR started on Cardizem gtt.  Orthopedics and cardio consulted.  Patient is scheduled to have ORIF today. ? ?Assessment and Plan: ?* Closed femur fracture ([place]) ?She presented s/p mechanical fall with right hip pain. ?X-ray showed Acute intertrochanteric right femur fracture. ?Continue adequate pain control. Continue IV fluids.  ?Orthopedics consulted, scheduled for ORIF today. ?PT and OT evaluation. ? ?Atrial fibrillation with RVR ([place]) ?She was found to have A-fib with RVR on EKG. ?She was given Cardizem 10 mg IV once,  heart rate remained uncontrolled. ?She is started on Cardizem gtt.  Heart rate is now controlled. ?Not started on anticoagulation given plan for surgery in the morning. ?Cardiology consulted, changed to Cardizem po postoperatively. ?She denies any history of atrial fibrillation. ?Start Eliquis postoperatively when ok by ortho ?Obtain 2D echocardiogram ? ? ?Depression ?Continue Zoloft. ? ?Asthma, persistent controlled ?Does not seem to have acute exacerbation. ?Continue home inhalers. ? ?Essential hypertension ?Continue losartan. ? ? ?Anxiety state ?Continue lorazepam as needed. ? ? ?Nutrition Status: ?Nutrition Problem: Increased nutrient needs ?Etiology: hip fracture, post-op healing ?Signs/Symptoms: estimated needs ?Interventions: Ensure Surgery, MVI ?  ? ?Subjective:  ?Patient was seen and examined at bedside.  Overnight events noted. ?Patient reports  feeling better, states right hip hurts when she moves, does not hurt when she is lying still. ?She is scheduled to have ORIF today.  Denies any chest pain or palpitation. ? ?Physical Exam: ?Vitals:  ? 03/04/22 0556 03/04/22 0815 03/04/22 1126 03/04/22 1152  ?BP: '92/67 99/71 95/62 '$ 114/64  ?Pulse: 94 88 96 85  ?Resp: '18 18 18 20  '$ ?Temp: 98.3 ?F (36.8 ?C) 98.9 ?F (37.2 ?C) 98.8 ?F (37.1 ?C) 97.8 ?F (36.6 ?C)  ?TempSrc:  Oral Oral Oral  ?SpO2: 96% 91% 95% 94%  ?Weight:   66.5 kg   ? ?General exam: Appears comfortable, not in any acute distress.  Deconditioned ?Respiratory : CTA bilaterally, no wheezing, no crackles, respiratory effort normal. ?Cardiovascular : S1 S2 heard, irregular rhythm, no murmur ?Gastrointestinal : Abdomen is soft, nontender, nondistended, BS+ ?Central nervous system: Alert, oriented x3, no focal neurological deficits. ?Extremities: Tenderness in the right hip area, right LE externally rotated ?Psychiatry: Mood, insight, judgment normal ? ?Data Reviewed: ?I have Reviewed nursing notes, Vitals, and Lab results since pt's last encounter. Pertinent lab results CBC, BMP ?I have ordered test including CBC, BMP ?I have reviewed the last note from Ortho, cardiology,  ?I have discussed pt's care plan and test results with patient.  ? ?Family Communication: Son at bedside ? ?Disposition: ?Status is: Inpatient ?Remains inpatient appropriate because: Admitted s/p fall with right femur fracture.  Scheduled to have ORIF today. ?Anticipated discharge to SNF ? ?Planned Discharge Destination: Skilled nursing facility ? ?Time spent: 50 minutes ? ?Author: ?Shawna Clamp, MD ?03/04/2022 1:23 PM ? ?For on call review [email or web address].  ?

## 2022-03-04 NOTE — Progress Notes (Signed)
OT Cancellation Note ? ?Patient Details ?Name: Lisa Larson ?MRN: [id number] ?DOB: July 11, 1941 ? ? ?Cancelled Treatment:    Reason Eval/Treat Not Completed: Medical issues which prohibited therapy. Patient scheduled for sx today ~1800. Will re-attempt eval 3/8.  ? ?Delbert Phenix OT ?OT pager: [phone] ? ? ?Rosemary Holms ?03/04/2022, 7:21 AM ?
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
## 2022-03-04 NOTE — Progress Notes (Signed)
Initial Nutrition Assessment ? ?DOCUMENTATION CODES:  ? ?Not applicable ? ?INTERVENTION:  ?- diet advancement as medically feasible post-op. ?- will order Ensure Surgery BID, each supplement provides 330 kcal and 18 grams of protein. ?- will order 1 tablet multivitamin with minerals daily. ?- weigh patient today pre-op. ? ? ?NUTRITION DIAGNOSIS:  ? ?Increased nutrient needs related to hip fracture, post-op healing as evidenced by estimated needs. ? ?GOAL:  ? ?Patient will meet greater than or equal to 90% of their needs ? ?MONITOR:  ? ?Diet advancement, PO intake, Supplement acceptance, Labs, Weight trends, Skin ? ?REASON FOR ASSESSMENT:  ? ?Consult ?Hip fracture protocol ? ?ASSESSMENT:  ? ?81 y.o. female with medical history of degenerative arthritis, anxiety disorder, HTN, and depression. She presented to the ED s/p fall at home. She ate chicken last week and developed diarrhea which has now resolved. She was found to have R-sided closed femur fracture. ? ?Patient laying in bed with son at bedside. Patient noted to have some confusion/forgetfulness at times throughout conversation. When RD entered the room and approached patient, patient thought that RD was her daughter [name] who is flying from [place] to be with patient today.  ? ?Patient and son contribute to the conversation. Patient shares inability to get comfortable in bed today. She was NPO on arrival to the ED but advanced to Soft, thin liquids diet for dinner before being made NPO again at midnight. She reports having an [place] ice and 1/2 of a chicken sandwich with mustard.  ? ?Unable to get a good indication of PO intakes at home or any recent changes in intakes or appetite. She lives alone but her son checks in on her daily/nearly daily.  ? ?She has not been weighed since 11/28/21 when she weighed 147 lb and weight had been stable since 07/16/21. She reports weight recently has been ~144 lb.  ? ?Informed patient and son that RD put in an order for her to  be weighed prior to surgery. Patient reports she was informed that surgery would be around 1800 today.  ? ? ?Labs reviewed; Mg: 1.6 mg/dl, ?Medications reviewed; 100 mg colace BID, 2 g IV Mg sulfate x1 run 3/7, 5 mg deltaasone/day, 1 tablet senokot BID. ?IVF; LR @ 125 ml/hr. ?  ? ?NUTRITION - FOCUSED PHYSICAL EXAM: ? ?Flowsheet Row Most Recent Value  ?Orbital Region No depletion  ?Upper Arm Region No depletion  ?Thoracic and Lumbar Region No depletion  ?Buccal Region No depletion  ?Temple Region No depletion  ?Clavicle Bone Region No depletion  ?Clavicle and Acromion Bone Region No depletion  ?Scapular Bone Region Unable to assess  ?Dorsal Hand No depletion  ?Patellar Region Unable to assess  ?Anterior Thigh Region Unable to assess  ?Posterior Calf Region Unable to assess  ?Edema (RD Assessment) Unable to assess  ?Hair Reviewed  ?Eyes Reviewed  ?Mouth Reviewed  ?Skin Reviewed  ?Nails Reviewed  ? ?  ? ? ?Diet Order:   ?Diet Order   ? ?       ?  Diet NPO time specified  Diet effective ____       ?  ? ?  ?  ? ?  ? ? ?EDUCATION NEEDS:  ? ?Education needs have been addressed ? ?Skin:  Skin Assessment: Reviewed RN Assessment ? ?Last BM:  PTA/unknown ? ?Height:  ? ?Ht Readings from Last 1 Encounters:  ?11/28/21 5' (1.524 m)  ? ? ?Weight:  ? ?Wt Readings from Last 1 Encounters:  ?03/04/22 66.5  kg  ? ? ? ?BMI:  Body mass index is 28.63 kg/m?. ? ?Estimated Nutritional Needs:  ?Kcal:  1670-1850 kcal ?Protein:  80-90 grams ?Fluid:  >/= 1.7 L/day ? ? ? ? ?Jarome Matin, MS, RD, LDN ?Inpatient Clinical Dietitian ?RD pager # available in [place]  ?After hours/weekend pager # available in [place] ? ?

## 2022-03-04 NOTE — Anesthesia Preprocedure Evaluation (Addendum)
Anesthesia Evaluation  ?Patient identified by MRN, date of birth, ID band ?Patient awake ? ? ? ?Reviewed: ?Allergy & Precautions, NPO status , Patient's Chart, lab work & pertinent test results ? ?Airway ?Mallampati: I ? ?TM Distance: >3 FB ?Neck ROM: Full ? ? ? Dental ?no notable dental hx. ?(+) Dental Advisory Given, Poor Dentition,  ?  ?Pulmonary ?asthma , COPD, former smoker,  ?  ?Pulmonary exam normal ?breath sounds clear to auscultation ? ? ? ? ? ? Cardiovascular ?hypertension, Pt. on medications ?Normal cardiovascular exam ?Rhythm:Regular Rate:Normal ? ?New onset afib . ? ?Patient on [place] for rate control ?  ?Neuro/Psych ?Anxiety   ? GI/Hepatic ?Neg liver ROS, GERD  ,  ?Endo/Other  ? ? Renal/GU ?Lab Results ?     Component                Value               Date                 ?     CREATININE               0.69                03/04/2022           ?     BUN                      16                  03/04/2022           ?     NA                       138                 03/04/2022           ?     K                        3.5                 03/04/2022           ?     CL                       104                 03/04/2022           ?     CO2                      25                  03/04/2022           ?  ? ?  ?Musculoskeletal ? ?(+) Arthritis , Pt w C1 C2 fx  ? Abdominal ?  ?Peds ? Hematology ? ?(+) Blood dyscrasia, anemia , Lab Results ?     Component                Value               Date                 ?     WBC  9.0                 03/04/2022           ?     HGB                      11.5 (L)            03/04/2022           ?     HCT                      35.9 (L)            03/04/2022           ?     MCV                      100.8 (H)           03/04/2022           ?     PLT                      156                 03/04/2022           ?   ?Anesthesia Other Findings ?All: See list ? Reproductive/Obstetrics ? ?  ? ? ? ? ? ? ? ? ? ? ? ? ? ?  ?   ? ? ? ? ? ? ?Anesthesia Physical ?Anesthesia Plan ? ?ASA: 3 and emergent ? ?Anesthesia Plan: Spinal  ? ?Post-op Pain Management: Regional block* and Minimal or no pain anticipated  ? ?Induction:  ? ?PONV Risk Score and Plan: 2 and Treatment may vary due to age or medical condition and Ondansetron ? ?Airway Management Planned: Natural Airway and Nasal Cannula ? ?Additional Equipment:  ? ?Intra-op Plan:  ? ?Post-operative Plan:  ? ?Informed Consent: I have reviewed the patients History and Physical, chart, labs and discussed the procedure including the risks, benefits and alternatives for the proposed anesthesia with the patient or authorized representative who has indicated his/her understanding and acceptance.  ? ? ? ?Dental advisory given ? ?Plan Discussed with: CRNA and Anesthesiologist ? ?Anesthesia Plan Comments:   ? ? ? ? ? ?Anesthesia Quick Evaluation ? ?

## 2022-03-04 NOTE — Progress Notes (Signed)
? ?  Patient seen earlier today    ?I have reviewed echo that was just done    ?LVEF and RVEF are normal    No significant valvlular abnormalities ? ?The pt is 8   She has vascular dz  (aortic atherosclerosis) but no hx of events   She is not having any CP concerning for angina though she is not very active given back problems.   EKG shows atrial fibrillation with controlled rates    No pathologic Q waves.    ?She is at some increased risk given age and hx of pulmonary problems   But, I do not think prohibitive.     OK to proceed without further testing. ?Would follow closely on telemetry after surgery   Watch I/Os closely     Transfuse if needed ?OUr service will continue to follow  ? ? ?Signed, ?Dorris Carnes, MD  ?03/04/2022, 4:47 PM   ? ?
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
## 2022-03-05 ENCOUNTER — Inpatient Hospital Stay (HOSPITAL_COMMUNITY): Payer: Medicare Other

## 2022-03-05 DIAGNOSIS — S72144A Nondisplaced intertrochanteric fracture of right femur, initial encounter for closed fracture: Secondary | ICD-10-CM | POA: Diagnosis not present

## 2022-03-05 DIAGNOSIS — I4891 Unspecified atrial fibrillation: Secondary | ICD-10-CM | POA: Diagnosis not present

## 2022-03-05 DIAGNOSIS — F3289 Other specified depressive episodes: Secondary | ICD-10-CM | POA: Diagnosis not present

## 2022-03-05 DIAGNOSIS — I10 Essential (primary) hypertension: Secondary | ICD-10-CM

## 2022-03-05 DIAGNOSIS — J45998 Other asthma: Secondary | ICD-10-CM | POA: Diagnosis not present

## 2022-03-05 LAB — GLUCOSE, CAPILLARY: Glucose-Capillary: 227 mg/dL — ABNORMAL HIGH (ref 70–99)

## 2022-03-05 LAB — URINALYSIS, ROUTINE W REFLEX MICROSCOPIC
Bacteria, UA: NONE SEEN
Bilirubin Urine: NEGATIVE
Glucose, UA: NEGATIVE mg/dL
Hgb urine dipstick: NEGATIVE
Ketones, ur: 5 mg/dL — AB
Leukocytes,Ua: NEGATIVE
Nitrite: NEGATIVE
Protein, ur: 30 mg/dL — AB
Specific Gravity, Urine: 1.038 — ABNORMAL HIGH (ref 1.005–1.030)
pH: 5 (ref 5.0–8.0)

## 2022-03-05 LAB — CBC
HCT: 34 % — ABNORMAL LOW (ref 36.0–46.0)
Hemoglobin: 10.4 g/dL — ABNORMAL LOW (ref 12.0–15.0)
MCH: 32.5 pg (ref 26.0–34.0)
MCHC: 30.6 g/dL (ref 30.0–36.0)
MCV: 106.3 fL — ABNORMAL HIGH (ref 80.0–100.0)
Platelets: UNDETERMINED 10*3/uL (ref 150–400)
RBC: 3.2 MIL/uL — ABNORMAL LOW (ref 3.87–5.11)
RDW: 12.8 % (ref 11.5–15.5)
WBC: 9.4 10*3/uL (ref 4.0–10.5)
nRBC: 0 % (ref 0.0–0.2)

## 2022-03-05 LAB — BASIC METABOLIC PANEL
Anion gap: 8 (ref 5–15)
BUN: 16 mg/dL (ref 8–23)
CO2: 23 mmol/L (ref 22–32)
Calcium: 8.7 mg/dL — ABNORMAL LOW (ref 8.9–10.3)
Chloride: 103 mmol/L (ref 98–111)
Creatinine, Ser: 0.81 mg/dL (ref 0.44–1.00)
GFR, Estimated: >60 mL/min (ref >60)
Glucose, Bld: 166 mg/dL — ABNORMAL HIGH (ref 70–99)
Potassium: 5 mmol/L (ref 3.5–5.1)
Sodium: 134 mmol/L — ABNORMAL LOW (ref 135–145)

## 2022-03-05 LAB — MAGNESIUM: Magnesium: 2.1 mg/dL (ref 1.7–2.4)

## 2022-03-05 MED ORDER — TRANEXAMIC ACID-NACL 1000-0.7 MG/100ML-% IV SOLN
1000.0000 mg | Freq: Once | INTRAVENOUS | Status: AC
  Administered 2022-03-05: 1000 mg via INTRAVENOUS
  Filled 2022-03-05: qty 100

## 2022-03-05 MED ORDER — ONDANSETRON HCL 4 MG PO TABS
4.0000 mg | ORAL_TABLET | Freq: Four times a day (QID) | ORAL | Status: DC | PRN
Start: 2022-03-05 — End: 2022-03-08

## 2022-03-05 MED ORDER — ONDANSETRON HCL 4 MG/2ML IJ SOLN: 4.0000 mg | Freq: Four times a day (QID) | INTRAMUSCULAR | Status: DC | PRN

## 2022-03-05 MED ORDER — HYDROCODONE-ACETAMINOPHEN 5-325 MG PO TABS
1.0000 | ORAL_TABLET | ORAL | Status: DC | PRN
  Administered 2022-03-05 – 2022-03-08 (×6): 1 via ORAL
  Filled 2022-03-05 (×7): qty 1

## 2022-03-05 MED ORDER — ACETAMINOPHEN 325 MG PO TABS
325.0000 mg | ORAL_TABLET | Freq: Four times a day (QID) | ORAL | Status: DC | PRN
  Administered 2022-03-08: 650 mg via ORAL
  Filled 2022-03-05: qty 2

## 2022-03-05 MED ORDER — METOCLOPRAMIDE HCL 5 MG/ML IJ SOLN: 5.0000 mg | Freq: Three times a day (TID) | INTRAMUSCULAR | Status: DC | PRN

## 2022-03-05 MED ORDER — ASPIRIN 81 MG PO CHEW
81.0000 mg | CHEWABLE_TABLET | Freq: Two times a day (BID) | ORAL | Status: DC
Start: 2022-03-05 — End: 2022-03-08
  Administered 2022-03-05 – 2022-03-08 (×7): 81 mg via ORAL
  Filled 2022-03-05 (×7): qty 1

## 2022-03-05 MED ORDER — PHENOL 1.4 % MT LIQD: 1.0000 | OROMUCOSAL | Status: DC | PRN

## 2022-03-05 MED ORDER — MORPHINE SULFATE (PF) 2 MG/ML IV SOLN
0.5000 mg | INTRAVENOUS | Status: DC | PRN
  Administered 2022-03-05: 0.5 mg via INTRAVENOUS
  Filled 2022-03-05: qty 1

## 2022-03-05 MED ORDER — DOCUSATE SODIUM 100 MG PO CAPS
100.0000 mg | ORAL_CAPSULE | Freq: Two times a day (BID) | ORAL | Status: DC
  Administered 2022-03-05 – 2022-03-08 (×7): 100 mg via ORAL
  Filled 2022-03-05 (×8): qty 1

## 2022-03-05 MED ORDER — ACETAMINOPHEN 500 MG PO TABS
500.0000 mg | ORAL_TABLET | Freq: Four times a day (QID) | ORAL | Status: AC
  Administered 2022-03-05 (×4): 500 mg via ORAL
  Filled 2022-03-05 (×4): qty 1

## 2022-03-05 MED ORDER — MENTHOL 3 MG MT LOZG: 1.0000 | LOZENGE | OROMUCOSAL | Status: DC | PRN

## 2022-03-05 MED ORDER — DILTIAZEM HCL ER COATED BEADS 120 MG PO CP24
120.0000 mg | ORAL_CAPSULE | Freq: Every day | ORAL | Status: DC
  Administered 2022-03-05 – 2022-03-08 (×4): 120 mg via ORAL
  Filled 2022-03-05 (×4): qty 1

## 2022-03-05 MED ORDER — CEFAZOLIN SODIUM-DEXTROSE 2-4 GM/100ML-% IV SOLN
2.0000 g | Freq: Three times a day (TID) | INTRAVENOUS | Status: AC
  Administered 2022-03-05 (×2): 2 g via INTRAVENOUS
  Filled 2022-03-05 (×2): qty 100

## 2022-03-05 MED ORDER — SODIUM CHLORIDE 0.9 % IV SOLN: INTRAVENOUS | Status: AC

## 2022-03-05 MED ORDER — METOCLOPRAMIDE HCL 5 MG PO TABS: 5.0000 mg | ORAL_TABLET | Freq: Three times a day (TID) | ORAL | Status: DC | PRN

## 2022-03-05 NOTE — Progress Notes (Signed)
?  Progress Note ? ? ?Patient: Lisa Larson PQZ:[id number] DOB: January 23, 1941 DOA: 03/03/2022     2 ?DOS: the patient was seen and examined on 03/05/2022 ?  ? ? ? ? ?Brief hospital course: ?This 81 years old female with PMH significant for degenerative arthritis, anxiety disorder, essential hypertension, depression presented in the ED s/p fall at home.  She bent over to pick something from the ground and she lost balance and she landed on her right hip.  ? ?In the ER, X-ray showed right acute intertrochanteric femur fracture.  Patient is also found to have A-fib with RVR started on Cardizem gtt.  Orthopedics and cardio consulted.  Underwent ORIF on 3.7 ? ? ? ? ? ?Assessment and Plan: ?* Closed femur fracture ([place]) ?- Per orthopedics ? ?Atrial fibrillation with RVR ([place]) ?Echo without valvular disease.  TSH normal.   ?- Continue diltiazem ?- Start Eliquis if family agree ? ? ?Depression ?  ? ?Asthma, persistent controlled ?Chronic eosinophilic pneumonia ?-Continue prednisone, defer stress dosing for now, close monitoring ? ?Essential hypertension ?BP normal ?-Hold Losartan ? ? ?Anxiety state ?-Continue Zoloft  ? ? ? ? ?  ? ?Subjective: Patient has no complaints, no chest pain, no malaise.  Pain in the hip is well controlled.  No confusion. ? ?Physical Exam: ?Vitals:  ? 03/05/22 0803 03/05/22 1232 03/05/22 1553 03/05/22 1600  ?BP: 109/69 98/73 128/79   ?Pulse: 68 91 87   ?Resp: (!) '22 20 20   '$ ?Temp: 97.9 ?F (36.6 ?C) 98.2 ?F (36.8 ?C) 98.7 ?F (37.1 ?C)   ?TempSrc: Oral Oral Oral   ?SpO2: 98% 93% 94%   ?Weight:    69.1 kg  ?Height:    5' (1.524 m)  ? ?Elderly adult female, lying in bed, interactive, no acute distress ?Anicteric, conjunctival pink, lids and lashes normal.  Nasal cannula in place. ?Heart rate controlled, rhythm irregular, no JVD, no peripheral edema ?Lung sounds clear, without rales or wheezes ?Attention normal, affect appropriate, judgment and insight appear mildly impaired by dementia ? ? ? ? ?Data  Reviewed: ?Discussed with cardiology and orthopedics.  Cardiology and orthopedic notes reviewed, nursing notes reviewed, vital signs and laboratory studies below were reviewed ?Basic metabolic panel is unremarkable ?Complete blood count is notable for hemoglobin down to 10.5 ?Echocardiogram with normal EF, no significant valvular disease ? ?Family Communication: Son and daughter at the bedside ? ?Disposition: ?Status is: Inpatient ?Remains inpatient appropriate because: Has had a hip repair, she will require substantial rehabilitation to return to her prior level of function.  Arrangements for safe disposition are pending, likely Friday. ? Planned Discharge Destination: Skilled nursing facility ? ? ? ?  ? ?Author: ?Edwin Dada, MD ?03/05/2022 4:24 PM ? ?For on call review [email or web address].  ?

## 2022-03-05 NOTE — Progress Notes (Signed)
?  Subjective: ?Patient stable.  Pain controlled.  Still having some confusion and hallucinations.  ? ?Objective: ?Vital signs in last 24 hours: ?Temp:  [97.6 ?F (36.4 ?C)-98.7 ?F (37.1 ?C)] 98 ?F (36.7 ?C) (03/08 2143) ?Pulse Rate:  [54-91] 72 (03/08 2143) ?Resp:  [16-22] 16 (03/08 2143) ?BP: (98-128)/(68-82) 114/82 (03/08 2143) ?SpO2:  [93 %-100 %] 94 % (03/08 2143) ?Weight:  [69.1 kg] 69.1 kg (03/08 1600) ? ?Intake/Output from previous day: ?03/07 0701 - 03/08 0700 ?In: 3496.3 [P.O.:1051; I.V.:2195.3; IV Piggyback:250] ?Out: 50 [Blood:50] ?Intake/Output this shift: ?No intake/output data recorded. ? ?Exam: ?No pain with circumduction of the right leg.  Dressings intact.  Expected amount of swelling in the thigh. ?Dorsiflexion/Plantar flexion intact ? ?Labs: ?Recent Labs  ?  03/03/22 ?1611 03/04/22 ?0454 03/05/22 ?0459  ?HGB 13.4 11.5* 10.4*  ? ?Recent Labs  ?  03/04/22 ?0454 03/05/22 ?0459  ?WBC 9.0 9.4  ?RBC 3.56* 3.20*  ?HCT 35.9* 34.0*  ?PLT 156 PLATELET CLUMPS NOTED ON SMEAR, UNABLE TO ESTIMATE  ? ?Recent Labs  ?  03/04/22 ?0454 03/05/22 ?0459  ?NA 138 134*  ?K 3.5 5.0  ?CL 104 103  ?CO2 25 23  ?BUN 16 16  ?CREATININE 0.69 0.81  ?GLUCOSE 97 166*  ?CALCIUM 9.0 8.7*  ? ?Recent Labs  ?  03/03/22 ?1611  ?INR 1.0  ? ? ?Assessment/Plan: ?Impression is patient is doing well from her hip perspective.  Okay for touchdown to partial weightbearing for transfers.  Would not want her walking on the leg yet due to relatively poor bone quality.  Risk of cut out of the screw is present with unrestricted weightbearing ambulation.  Confusion and hallucinations are most likely related to sundowning and possibly early microangiopathic gray matter changes; however, would like to rule out acute intercranial process following the fall with CT scan. ?Appreciate cardiology assistance with rate control with atrial fibrillation.  Complicated decision about anticoagulation in this patient who may have a propensity to further falls.   Stroke is a risk without formal anticoagulation but head trauma will be a complication of of a fall with anticoagulation in place. ?Placement pending.  Currently on aspirin for DVT prophylaxis ? ? ?Anderson Malta ?03/05/2022, 10:48 PM  ? ? ?  ?

## 2022-03-05 NOTE — Progress Notes (Signed)
Progress Note  Patient Name: Lisa Larson Date of Encounter: 03/05/2022  [facility] Cardiologist: Berniece Salines, DO   Subjective   Lisa Larson is doing well. She is not having chest pain. Her family is by the bedside.  Inpatient Medications    Scheduled Meds:  acetaminophen  500 mg Oral Q6H   aspirin  81 mg Oral BID   docusate sodium  100 mg Oral BID   feeding supplement  237 mL Oral BID BM   fluticasone  2 spray Each Nare Daily   gabapentin  300 mg Oral TID   multivitamin with minerals  1 tablet Oral Daily   predniSONE  5 mg Oral Q breakfast   senna  1 tablet Oral BID   sertraline  200 mg Oral Daily   Continuous Infusions:  diltiazem (CARDIZEM) infusion 5 mg/hr (03/04/22 2235)   lactated ringers 125 mL/hr at 03/03/22 1854   methocarbamol (ROBAXIN) IV     methocarbamol (ROBAXIN) IV     PRN Meds: [START ON 03/06/2022] acetaminophen, albuterol, fentaNYL (SUBLIMAZE) injection, HYDROcodone-acetaminophen, LORazepam, menthol-cetylpyridinium **OR** phenol, methocarbamol **OR** methocarbamol (ROBAXIN) IV, methocarbamol **OR** methocarbamol (ROBAXIN) IV, metoCLOPramide **OR** metoCLOPramide (REGLAN) injection, morphine injection, ondansetron **OR** ondansetron (ZOFRAN) IV   Vital Signs    Vitals:   03/04/22 2338 03/05/22 0059 03/05/22 0803 03/05/22 1232  BP: 108/74 109/68 109/69 98/73  Pulse: (!) 54 68 68 91  Resp: 16 16 (!) 22 20  Temp:  97.6 F (36.4 C) 97.9 F (36.6 C) 98.2 F (36.8 C)  TempSrc:  Oral Oral Oral  SpO2: 100% 99% 98% 93%  Weight:        Intake/Output Summary (Last 24 hours) at 03/05/2022 1253 Last data filed at 03/05/2022 1128 Gross per 24 hour  Intake 3138.05 ml  Output 50 ml  Net 3088.05 ml   Last 3 Weights 03/04/2022 11/28/2021 10/18/2021  Weight (lbs) 146 lb 9.7 oz 147 lb 145 lb  Weight (kg) 66.5 kg 66.679 kg 65.772 kg      Telemetry    Rate controlled afib - Personally Reviewed  ECG    No new - Personally Reviewed  Physical Exam    Vitals:   03/05/22 0803 03/05/22 1232  BP: 109/69 98/73  Pulse: 68 91  Resp: (!) 22 20  Temp: 97.9 F (36.6 C) 98.2 F (36.8 C)  SpO2: 98% 93%    GEN: No acute distress.   Neck: No JVD Cardiac: irregular rhythm, no murmurs, rubs, or gallops.  Respiratory: Clear to auscultation bilaterally. GI: Soft, nontender, non-distended  MS: No edema; No deformity. Neuro:  Nonfocal  Psych: Normal affect   Labs    High Sensitivity Troponin:  No results for input(s): TROPONINIHS in the last 720 hours.   Chemistry Recent Labs  Lab 03/03/22 1611 03/04/22 0454 03/05/22 0459  NA 141 138 134*  K 4.0 3.5 5.0  CL 106 104 103  CO2 '26 25 23  '$ GLUCOSE 127* 97 166*  BUN '19 16 16  '$ CREATININE 0.78 0.69 0.81  CALCIUM 9.5 9.0 8.7*  MG 1.9 1.6* 2.1  PROT 6.9  --   --   ALBUMIN 4.2  --   --   AST 32  --   --   ALT 21  --   --   ALKPHOS 46  --   --   BILITOT 0.4  --   --   GFRNONAA >60 >60 >60  ANIONGAP '9 9 8    '$ Lipids No results for input(s):  CHOL, TRIG, HDL, LABVLDL, LDLCALC, CHOLHDL in the last 168 hours.  Hematology Recent Labs  Lab 03/03/22 1611 03/04/22 0454 03/05/22 0459  WBC 11.2* 9.0 9.4  RBC 4.10 3.56* 3.20*  HGB 13.4 11.5* 10.4*  HCT 40.7 35.9* 34.0*  MCV 99.3 100.8* 106.3*  MCH 32.7 32.3 32.5  MCHC 32.9 32.0 30.6  RDW 13.0 12.9 12.8  PLT 176 156 PLATELET CLUMPS NOTED ON SMEAR, UNABLE TO ESTIMATE   Thyroid  Recent Labs  Lab 03/03/22 1618  TSH 1.439    BNPNo results for input(s): BNP, PROBNP in the last 168 hours.  DDimer No results for input(s): DDIMER in the last 168 hours.   Radiology    Pelvis Portable  Result Date: 03/04/2022 CLINICAL DATA:  Status post right intramedullary nail. EXAM: PORTABLE PELVIS 1-2 VIEWS COMPARISON:  Right hip x-ray 03/03/2022 FINDINGS: There is a new right femoral intramedullary nail and hip screw fixating an intratrochanteric fracture. Alignment is anatomic. No hardware loosening. There is lateral hip soft tissue swelling and air  compatible with recent surgery. IMPRESSION: 1. New right femoral intramedullary nail and hip screw in anatomic alignment. Electronically Signed   By: Ronney Asters M.D.   On: 03/04/2022 22:48   DG C-Arm 1-60 Min-No Report  Result Date: 03/04/2022 Fluoroscopy was utilized by the requesting physician.  No radiographic interpretation.   ECHOCARDIOGRAM COMPLETE  Result Date: 03/04/2022    ECHOCARDIOGRAM REPORT   Patient Name:   Lisa Larson Date of Exam: 03/04/2022 Medical Rec #:  [id number]           Height:       60.0 in Accession #:    [id number]          Weight:       146.6 lb Date of Birth:  Aug 24, 1941           BSA:          1.636 m Patient Age:    81 years            BP:           114/64 mmHg Patient Gender: F                   HR:           74 bpm. Exam Location:  Inpatient Procedure: 2D Echo, Cardiac Doppler and Color Doppler Indications:    R07.9 CHEST PAIN  History:        Patient has no prior history of Echocardiogram examinations.                 COPD; Signs/Symptoms:Murmur.  Sonographer:    Beryle Beams Referring Phys: [id number] PAULA V ROSS IMPRESSIONS  1. Left ventricular ejection fraction, by estimation, is 60 to 65%. The left ventricle has normal function. The left ventricle has no regional wall motion abnormalities. Left ventricular diastolic function could not be evaluated.  2. Right ventricular systolic function is low normal. The right ventricular size is normal. There is normal pulmonary artery systolic pressure. The estimated right ventricular systolic pressure is 28.7 mmHg.  3. The mitral valve is abnormal. Trivial mitral valve regurgitation. Moderate mitral annular calcification.  4. Tricuspid valve regurgitation is moderate.  5. The aortic valve is tricuspid. Aortic valve regurgitation is not visualized.  6. The inferior vena cava is normal in size with <50% respiratory variability, suggesting right atrial pressure of 8 mmHg. Comparison(s): No prior Echocardiogram. FINDINGS  Left  Ventricle: Left ventricular ejection fraction, by  estimation, is 60 to 65%. The left ventricle has normal function. The left ventricle has no regional wall motion abnormalities. The left ventricular internal cavity size was normal in size. There is  no left ventricular hypertrophy. Left ventricular diastolic function could not be evaluated due to atrial fibrillation. Left ventricular diastolic function could not be evaluated. Right Ventricle: The right ventricular size is normal. No increase in right ventricular wall thickness. Right ventricular systolic function is low normal. There is normal pulmonary artery systolic pressure. The tricuspid regurgitant velocity is 2.60 m/s,  and with an assumed right atrial pressure of 8 mmHg, the estimated right ventricular systolic pressure is 10.1 mmHg. Left Atrium: Left atrial size was normal in size. Right Atrium: Right atrial size was normal in size. Pericardium: There is no evidence of pericardial effusion. Mitral Valve: The mitral valve is abnormal. Moderate mitral annular calcification. Trivial mitral valve regurgitation. Tricuspid Valve: The tricuspid valve is grossly normal. Tricuspid valve regurgitation is moderate. Aortic Valve: The aortic valve is tricuspid. Aortic valve regurgitation is not visualized. Aortic valve mean gradient measures 3.0 mmHg. Aortic valve peak gradient measures 4.8 mmHg. Aortic valve area, by VTI measures 1.16 cm. Pulmonic Valve: The pulmonic valve was normal in structure. Pulmonic valve regurgitation is not visualized. Aorta: The aortic root and ascending aorta are structurally normal, with no evidence of dilitation. Venous: The inferior vena cava is normal in size with less than 50% respiratory variability, suggesting right atrial pressure of 8 mmHg. IAS/Shunts: No atrial level shunt detected by color flow Doppler.  LEFT VENTRICLE PLAX 2D LVIDd:         3.10 cm LVIDs:         2.00 cm LV PW:         0.70 cm LV IVS:        0.70 cm LVOT diam:      1.50 cm LV SV:         24 LV SV Index:   15 LVOT Area:     1.77 cm  LV Volumes (MOD) LV vol d, MOD A2C: 28.0 ml LV vol d, MOD A4C: 23.8 ml LV vol s, MOD A2C: 10.2 ml LV vol s, MOD A4C: 10.4 ml LV SV MOD A2C:     17.8 ml LV SV MOD A4C:     23.8 ml LV SV MOD BP:      15.7 ml RIGHT VENTRICLE            IVC RV S prime:     9.36 cm/s  IVC diam: 2.00 cm TAPSE (M-mode): 1.3 cm LEFT ATRIUM             Index        RIGHT ATRIUM           Index LA diam:        3.30 cm 2.02 cm/m   RA Area:     12.30 cm LA Vol (A2C):   33.4 ml 20.42 ml/m  RA Volume:   28.80 ml  17.61 ml/m LA Vol (A4C):   36.2 ml 22.13 ml/m LA Biplane Vol: 35.7 ml 21.82 ml/m  AORTIC VALVE                    PULMONIC VALVE AV Area (Vmax):    1.16 cm     PV Vmax:       0.51 m/s AV Area (Vmean):   1.15 cm     PV Vmean:  33.000 cm/s AV Area (VTI):     1.16 cm     PV VTI:        0.080 m AV Vmax:           110.00 cm/s  PV Peak grad:  1.0 mmHg AV Vmean:          73.700 cm/s  PV Mean grad:  1.0 mmHg AV VTI:            0.211 m AV Peak Grad:      4.8 mmHg AV Mean Grad:      3.0 mmHg LVOT Vmax:         72.50 cm/s LVOT Vmean:        47.800 cm/s LVOT VTI:          0.138 m LVOT/AV VTI ratio: 0.65  AORTA Ao Root diam: 2.30 cm Ao Asc diam:  2.40 cm TRICUSPID VALVE TR Peak grad:   27.0 mmHg TR Mean grad:   19.0 mmHg TR Vmax:        260.00 cm/s TR Vmean:       205.0 cm/s  SHUNTS Systemic VTI:  0.14 m Systemic Diam: 1.50 cm Lyman Bishop MD Electronically signed by Lyman Bishop MD Signature Date/Time: 03/04/2022/4:52:33 PM    Final    DG Hip Unilat  With Pelvis 2-3 Views Right  Result Date: 03/03/2022 CLINICAL DATA:  Right hip pain.  Fall. EXAM: DG HIP (WITH OR WITHOUT PELVIS) 2-3V RIGHT COMPARISON:  Hip radiographs 03/22/2019 FINDINGS: There is an acute minimally displaced intertrochanteric fracture of the right femur. Advanced right hip osteoarthrosis is noted with severe joint space narrowing, subchondral sclerosis, and marginal osteophytosis. Mild  degenerative changes are noted at the left hip. IMPRESSION: Acute intertrochanteric right femur fracture. Electronically Signed   By: Logan Bores M.D.   On: 03/03/2022 15:46   DG FEMUR, MIN 2 VIEWS RIGHT  Result Date: 03/05/2022 CLINICAL DATA:  Surgery EXAM: RIGHT FEMUR 2 VIEWS COMPARISON:  None. FINDINGS: Intraoperative images during right intertrochanteric fracture fixation with intramedullary nail. Improved alignment. No evidence of immediate complication. Moderate right hip osteoarthritis. IMPRESSION: Intraoperative images during right intertrochanteric fracture ORIF. No evidence of immediate complication. Electronically Signed   By: Maurine Simmering M.D.   On: 03/05/2022 08:11    Cardiac Studies  TTE  1. Left ventricular ejection fraction, by estimation, is 60 to 65%. The  left ventricle has normal function. The left ventricle has no regional  wall motion abnormalities. Left ventricular diastolic function could not  be evaluated.   2. Right ventricular systolic function is low normal. The right  ventricular size is normal. There is normal pulmonary artery systolic  pressure. The estimated right ventricular systolic pressure is 56.2 mmHg.   3. The mitral valve is abnormal. Trivial mitral valve regurgitation.  Moderate mitral annular calcification.   4. Tricuspid valve regurgitation is moderate.   5. The aortic valve is tricuspid. Aortic valve regurgitation is not  visualized.   6. The inferior vena cava is normal in size with <50% respiratory  variability, suggesting right atrial pressure of 8 mmHg.   CHA2DS2-VASc Score = 4   This indicates a 4.8% annual risk of stroke. The patient's score is based upon: CHF History: 0 HTN History: 1 Diabetes History: 0 Stroke History: 0 Vascular Disease History: 0 Age Score: 2 Gender Score: 1      Patient Profile     Lisa Larson is a 81 y.o. female with PMH significant for degenerative  arthritis, anxiety disorder, essential  hypertension, depression, atypical chest pain, presented in the ED s/p fall at home today morning, consult for pre-op cardiac risk assessment for right hip fx s/p intramedullar nail intertronchantric 3/7  Assessment & Plan     Pre-op Cardiac Risk assessment  - patient was acceptable risk for surgery; patient underwent procedure - no ACS; no plans for ischemic eval  Paroxysmal Atrial Fibrillation/Chads2vasc 4: normal LV function. Appears to be new diagnosis. Saw Dr. Harriet Masson. Will continue rate control strategy for now - rate controlled - stop dilt gtt - transition to cardizem 120 mg daily (IF Bps tolerate she can be discharged on this) - in terms of long term anticoagulation; her daughter noted that she had several falls in the past. She lives alone. Can consider discussing further as an outpatient if decision has not been made. Considering her fall risk, anticoagulation may be more risk than benefit at this time; if her circumstances change then this can be re-addressed. We discussed risks/benefits of anticoagulation. They will think about it further. If planning for anticoagulation can do eliquis 5 mg BID.  If rates controlled and Bps stable, will likely sign off tomorrow     For questions or updates, please contact [place] Please consult [email or web address] for contact info under        Signed, Janina Mayo, MD  03/05/2022, 12:53 PM

## 2022-03-05 NOTE — Assessment & Plan Note (Signed)
Magnesium supplemented. ?

## 2022-03-05 NOTE — Evaluation (Signed)
Physical Therapy Evaluation Patient Details Name: Lisa Larson MRN: [id number] DOB: [birth year] Today's Date: 03/05/2022  History of Present Illness  Pt. is a 80 y.o. female presenting to [facility] on 3/6 after a fall at home as well as 1 week history of diarrhea. She presents with a R hip fracture and had a R hip ORIF on 3/7 (NWB gait, TTWB transfers). Now also being evaled for Afib and RVR. PMH significant for COPD, remote tob, HTN, GERD, osteopenia, OA, HA, depression, anxiety.  Clinical Impression  Pt presents with pain, decreased ROM, strength, functional transfer ability, standing balance, and gait tolerance. These impairments are limiting her ability to safely and independently transfer, get into her home, perform all adls/iadls, and ambulate in the community. Pt to benefit from acute PT to address deficits. AAROM, bed mobility, STS, and stand pivot transfer was performed.  Pt. Responded well but was limited secondary to pain levels, cognitive processing, knowledge of precautions, and overall functional capacity. Pt. Has significant anxiety during any movement that presents as apprehension and fear-avoidance. SPT recommends d/c to SNF once medically stable because of increased need for support at baseline and current functional level.  PT to progress mobility as tolerated, and will continue to follow acutely.         Recommendations for follow up therapy are one component of a multi-disciplinary discharge planning process, led by the attending physician.  Recommendations may be updated based on patient status, additional functional criteria and insurance authorization.  Follow Up Recommendations Skilled nursing-short term rehab (<3 hours/day)    Assistance Recommended at Discharge Frequent or constant Supervision/Assistance  Patient can return home with the following  A lot of help with walking and/or transfers;A lot of help with bathing/dressing/bathroom;Assist for transportation;Assistance  with cooking/housework;Direct supervision/assist for financial management;Help with stairs or ramp for entrance    Equipment Recommendations Rolling walker (2 wheels) (Defer to next venue, pt requests rollator (would be unsafe))  Recommendations for Other Services       Functional Status Assessment Patient has had a recent decline in their functional status and demonstrates the ability to make significant improvements in function in a reasonable and predictable amount of time.     Precautions / Restrictions Precautions Precautions: Fall Precaution Comments: Pt. unable to verbally state precautions w/o cuing Restrictions Weight Bearing Restrictions: Yes RLE Weight Bearing: Non weight bearing Other Position/Activity Restrictions: TTWB for transfer      Mobility  Bed Mobility Overal bed mobility: Needs Assistance Bed Mobility: Sidelying to Sit, Supine to Sit   Sidelying to sit: +2 for physical assistance, Mod assist Supine to sit: +2 for physical assistance, Mod assist       Patient Response: Anxious, Impulsive  Transfers Overall transfer level: Needs assistance Equipment used: Rolling walker (2 wheels) Transfers: Sit to/from Stand, Bed to chair/wheelchair/BSC Sit to Stand: +2 physical assistance, Mod assist Stand pivot transfers: Mod assist, +2 physical assistance              Ambulation/Gait               General Gait Details: n/a  Stairs            Wheelchair Mobility    Modified Rankin (Stroke Patients Only)       Balance Overall balance assessment: Needs assistance, History of Falls Sitting-balance support: No upper extremity supported Sitting balance-Leahy Scale: Fair Sitting balance - Comments: Able to maintain sitting balance   Standing balance support: Bilateral upper extremity supported Standing balance-Leahy Scale:  Poor Standing balance comment: Pt. requires support for steady, mod cuing for L sided weight shift to avoid R LE weight  bearing and to steady                             Pertinent Vitals/Pain Pain Assessment Pain Assessment: 0-10 Pain Score: 4  Pain Location: R hip, pain at 8/10 after trasnfer Pain Descriptors / Indicators: Aching, Guarding, Tender, Sore Pain Intervention(s): Limited activity within patient's tolerance, Monitored during session, Patient requesting pain meds-RN notified    Home Living Family/patient expects to be discharged to:: Private residence Living Arrangements: Alone Available Help at Discharge: Family;Available PRN/intermittently (Son and Daughter able to come help but they both work) Type of Home: House Home Access: Ramped entrance       [place]: [place]: [place] - built in;Grab bars - tub/shower;Rollator (4 wheels);Cane - single point      Prior Function Prior Level of Function : Needs assist             Mobility Comments: Uses rollator at baseline ADLs Comments: Doesn't cook at [place], has a hard time getting dressed     Hand Dominance   Dominant Hand: Left    Extremity/Trunk Assessment   Upper Extremity Assessment Upper Extremity Assessment: Defer to OT evaluation    Lower Extremity Assessment Lower Extremity Assessment: RLE deficits/detail RLE Deficits / Details: Limited motion, AROM & PROM RLE: Unable to fully assess due to pain    Cervical / Trunk Assessment Cervical / Trunk Assessment: Kyphotic  Communication   Communication: No difficulties  Cognition Arousal/Alertness: Awake/alert Behavior During Therapy: Anxious Overall Cognitive Status: Impaired/Different from baseline Area of Impairment: Orientation, Attention, Following commands                 Orientation Level: Place (Comes in and out of orientation to place) Current Attention Level: Sustained   Following Commands: Follows multi-step commands inconsistently, Follows one step commands consistently       General Comments: Pt. requires  significant cuing and prompting for all actions and mobility. Has anxiety with any movement that presents as stalling        General Comments      Exercises General Exercises - Lower Extremity Ankle Circles/Pumps: AROM, Both, 10 reps Quad Sets: AROM, Right, 5 reps Heel Slides: AAROM, Right, 5 reps (Limited ROM to pain tolerance)   Assessment/Plan    PT Assessment Patient needs continued PT services  PT Problem List Decreased strength;Decreased mobility;Decreased safety awareness;Impaired tone;Decreased range of motion;Decreased coordination;Decreased knowledge of precautions;Decreased activity tolerance;Decreased cognition;Decreased balance;Decreased knowledge of use of DME;Pain       PT Treatment Interventions DME instruction;Therapeutic exercise;Gait training;Balance training;Stair training;Neuromuscular re-education;Functional mobility training;Therapeutic activities;Cognitive remediation;Patient/family education    PT Goals (Current goals can be found in the Care Plan section)  Acute Rehab PT Goals Patient Stated Goal: Manage pain and begin walking to return home. PT Goal Formulation: With patient/family Time For Goal Achievement: 03/19/22 Potential to Achieve Goals: Fair    Frequency Min 3X/week     Co-evaluation               AM-PAC PT "6 Clicks" Mobility  Outcome Measure Help needed turning from your back to your side while in a flat bed without using bedrails?: A Lot Help needed moving from lying on your back to sitting on the side of a flat bed without using bedrails?: A Lot Help  needed moving to and from a bed to a chair (including a wheelchair)?: A Lot Help needed standing up from a chair using your arms (e.g., wheelchair or bedside chair)?: A Lot Help needed to walk in hospital room?: Total Help needed climbing 3-5 steps with a railing? : Total 6 Click Score: 10    End of Session Equipment Utilized During Treatment: Gait belt;Oxygen Activity Tolerance:  Patient limited by pain Patient left: in chair;with call bell/phone within reach;with chair alarm set;with family/visitor present Nurse Communication: Mobility status PT Visit Diagnosis: Unsteadiness on feet (R26.81);Muscle weakness (generalized) (M62.81);Pain Pain - Right/Left: Right Pain - part of body: Leg    Time: 0802-0836 PT Time Calculation (min) (ACUTE ONLY): 34 min   Charges:   PT Evaluation $PT Eval Low Complexity: 1 Low PT Treatments $Therapeutic Activity: 8-22 mins        Thermon Leyland, SPT Acute Rehab Services   Thermon Leyland 03/05/2022, 11:12 AM

## 2022-03-05 NOTE — Progress Notes (Signed)
OT Cancellation Note ? ?Patient Details ?Name: Lisa Larson ?MRN: [id number] ?DOB: [birth year] ? ? ?Cancelled Treatment:    Reason Eval/Treat Not Completed: Patient at procedure or test/ unavailable. Attempted x 3 - patient unavailable each time. ? ?[name] ?03/05/2022, 2:41 PM ?

## 2022-03-05 NOTE — TOC Initial Note (Signed)
Transition of Care (TOC) - Initial/Assessment Note  ? ? ?Patient Details  ?Name: Lisa Larson ?MRN: [id number] ?Date of Birth: 1941/10/05 ? ?Transition of Care [facility]) CM/SW Contact:    ?Dessa Phi, RN ?Phone Number: ?03/05/2022, 9:44 AM ? ?Clinical Narrative:  Spoke to dtr [name] about d/c plans-agree to SNF if recc.               ? ? ?Expected Discharge Plan: [place] ?Barriers to Discharge: Continued Medical Work up ? ? ?Patient Goals and CMS Choice ?Patient states their goals for this hospitalization and ongoing recovery are:: Rehab ?CMS Medicare.gov Compare Post Acute Care list provided to:: Patient Represenative (must comment) ([name] NLZ [phone]) ?Choice offered to / list presented to : Adult Children ? ?Expected Discharge Plan and Services ?Expected Discharge Plan: [place] ?  ?Discharge Planning Services: CM Consult ?Post Acute Care Choice: [place] ?Living arrangements for the past 2 months: [place] ?                ?  ?  ?  ?  ?  ?  ?  ?  ?  ?  ? ?Prior Living Arrangements/Services ?Living arrangements for the past 2 months: [place] ?Lives with:: Self ?Patient language and need for interpreter reviewed:: Yes ?Do you feel safe going back to the place where you live?: Yes      ?Need for Family Participation in Patient Care: Yes (Comment) ?Care giver support system in place?: Yes (comment) ?  ?Criminal Activity/Legal Involvement Pertinent to Current Situation/Hospitalization: No - Comment as needed ? ?Activities of Daily Living ?Home Assistive Devices/Equipment: None ?ADL Screening (condition at time of admission) ?Patient's cognitive ability adequate to safely complete daily activities?: Yes ?Is the patient deaf or have difficulty hearing?: No ?Does the patient have difficulty seeing, even when wearing glasses/contacts?: No ?Does the patient have difficulty concentrating, remembering, or making decisions?: Yes ?Patient able to  express need for assistance with ADLs?: Yes ?Does the patient have difficulty dressing or bathing?: Yes ?Independently performs ADLs?: No ?Communication: Independent, Appropriate for developmental age ?Dressing (OT): Needs assistance ?Is this a change from baseline?: Change from baseline, expected to last <3days ?Grooming: Independent, Appropriate for developmental age ?Feeding: Independent, Appropriate for developmental age ?Bathing: Needs assistance ?Is this a change from baseline?: Change from baseline, expected to last <3 days ?Toileting: Needs assistance ?Is this a change from baseline?: Change from baseline, expected to last <3 days ?In/Out Bed: Needs assistance ?Is this a change from baseline?: Change from baseline, expected to last <3 days ?Walks in Home: Independent ?Does the patient have difficulty walking or climbing stairs?: Yes ?Weakness of Legs: None ?Weakness of Arms/Hands: None ? ?Permission Sought/Granted ?Permission sought to share information with : Case Manager ?Permission granted to share information with : Yes, Verbal Permission Granted ? Share Information with NAME: Case manager ?   ? Permission granted to share info w Relationship: [name] dtr [phone] ?   ? ?Emotional Assessment ?Appearance:: Appears stated age ?Attitude/Demeanor/Rapport: Gracious ?Affect (typically observed): Accepting ?Orientation: : Oriented to Self, Oriented to Place, Oriented to  Time, Oriented to Situation ?Alcohol / Substance Use: Not Applicable ?Psych Involvement: No (comment) ? ?Admission diagnosis:  Closed femur fracture ([place]) [S72.90XA] ?Patient Active Problem List  ? Diagnosis Date Noted  ? Closed femur fracture ([place]) 03/03/2022  ? Atrial fibrillation with RVR ([place]) 03/03/2022  ? Severe persistent asthma 02/24/2021  ? 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  Urinary frequency 05/26/2020  ? Urinary incontinence 05/25/2020  ? Tingling 11/28/2019  ? 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  Candidiasis of skin 07/09/2018  ? Lightheadedness 07/09/2018  ? Hypotension 07/09/2018  ? Fall at home 12/27/2017  ? Osteoporosis 12/22/2016  ? 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 Family history of diabetes mellitus 09/22/2016  ? Cough 06/11/2016  ? UTI (urinary tract infection) 05/03/2016  ? Memory changes 06/27/2015  ? Depression 03/24/2015  ? Spine pain, multilevel 02/06/2015  ? Allergic rhinitis 04/17/2011  ? Hyperglycemia 11/28/2010  ? Asthma, persistent controlled 11/28/2010  ? GOUT, UNSPECIFIED 05/04/2009  ? Anxiety state 09/29/2008  ? Essential hypertension 08/09/2008  ? GERD 09/29/2007  ? Osteoarthritis, diffuse 06/09/2007  ? ?PCP:  Binnie Rail, MD ?Pharmacy:   ?[place], [place] [address] ?Phone: [phone] Fax: [phone] ? ?[facility] [place] ?[place] [address] ?Phone: [phone] Fax: [phone] ? ?[id number] [name] [place] ?[place] [address] ?Phone: [phone] Fax: [phone] ? ? ? ? ?Social Determinants of Health (SDOH) Interventions ?  ? ?Readmission Risk Interventions ?No flowsheet data found. ? ? ?

## 2022-03-05 NOTE — Transfer of Care (Signed)
Immediate Anesthesia Transfer of Care Note ? ?Patient: Lisa Larson ? ?Procedure(s) Performed: Procedure(s): ?INTRAMEDULLARY (IM) NAIL INTERTROCHANTRIC (Right) ? ?Patient Location: PACU ? ?Anesthesia Type:Spinal ? ?Level of Consciousness: awake, alert  and oriented ? ?Airway & Oxygen Therapy: Patient Spontanous Breathing ? ?Post-op Assessment: Report given to RN and Post -op Vital signs reviewed and stable ? ?Post vital signs: Reviewed and stable ? ?Last Vitals:  ?Vitals:  ? 03/05/22 1232 03/05/22 1553  ?BP: 98/73 128/79  ?Pulse: 91 87  ?Resp: 20 20  ?Temp: 36.8 ?C 37.1 ?C  ?SpO2: 93% 94%  ? ? ?Complications: No apparent anesthesia complications ? ?

## 2022-03-06 ENCOUNTER — Encounter: Payer: Self-pay | Admitting: Pulmonary Disease

## 2022-03-06 DIAGNOSIS — D72829 Elevated white blood cell count, unspecified: Secondary | ICD-10-CM

## 2022-03-06 DIAGNOSIS — F3289 Other specified depressive episodes: Secondary | ICD-10-CM | POA: Diagnosis not present

## 2022-03-06 DIAGNOSIS — S72144A Nondisplaced intertrochanteric fracture of right femur, initial encounter for closed fracture: Secondary | ICD-10-CM | POA: Diagnosis not present

## 2022-03-06 DIAGNOSIS — J45998 Other asthma: Secondary | ICD-10-CM | POA: Diagnosis not present

## 2022-03-06 DIAGNOSIS — I4891 Unspecified atrial fibrillation: Secondary | ICD-10-CM | POA: Diagnosis not present

## 2022-03-06 DIAGNOSIS — R443 Hallucinations, unspecified: Secondary | ICD-10-CM

## 2022-03-06 LAB — BASIC METABOLIC PANEL
Anion gap: 7 (ref 5–15)
BUN: 21 mg/dL (ref 8–23)
CO2: 24 mmol/L (ref 22–32)
Calcium: 8.9 mg/dL (ref 8.9–10.3)
Chloride: 104 mmol/L (ref 98–111)
Creatinine, Ser: 0.76 mg/dL (ref 0.44–1.00)
GFR, Estimated: >60 mL/min (ref >60)
Glucose, Bld: 147 mg/dL — ABNORMAL HIGH (ref 70–99)
Potassium: 4 mmol/L (ref 3.5–5.1)
Sodium: 135 mmol/L (ref 135–145)

## 2022-03-06 LAB — CBC
HCT: 32.1 % — ABNORMAL LOW (ref 36.0–46.0)
Hemoglobin: 10.2 g/dL — ABNORMAL LOW (ref 12.0–15.0)
MCH: 32.4 pg (ref 26.0–34.0)
MCHC: 31.8 g/dL (ref 30.0–36.0)
MCV: 101.9 fL — ABNORMAL HIGH (ref 80.0–100.0)
Platelets: 120 10*3/uL — ABNORMAL LOW (ref 150–400)
RBC: 3.15 MIL/uL — ABNORMAL LOW (ref 3.87–5.11)
RDW: 12.8 % (ref 11.5–15.5)
WBC: 13 10*3/uL — ABNORMAL HIGH (ref 4.0–10.5)
nRBC: 0 % (ref 0.0–0.2)

## 2022-03-06 NOTE — Telephone Encounter (Signed)
Please advise 

## 2022-03-06 NOTE — Anesthesia Postprocedure Evaluation (Signed)
Anesthesia Post Note ? ?Patient: Lisa Larson ? ?Procedure(s) Performed: INTRAMEDULLARY (IM) NAIL INTERTROCHANTRIC (Right) ? ?  ? ?Patient location during evaluation: Nursing Unit ?Anesthesia Type: Spinal ?Level of consciousness: oriented and awake and alert ?Pain management: pain level controlled ?Vital Signs Assessment: post-procedure vital signs reviewed and stable ?Respiratory status: spontaneous breathing and respiratory function stable ?Cardiovascular status: blood pressure returned to baseline and stable ?Postop Assessment: no headache, no backache, no apparent nausea or vomiting and patient able to bend at knees ?Anesthetic complications: no ? ? ?No notable events documented. ? ?Last Vitals:  ?Vitals:  ? 03/05/22 2337 03/06/22 0446  ?BP: 100/67 119/87  ?Pulse: 89 82  ?Resp: 16 16  ?Temp: 36.5 ?C 36.5 ?C  ?SpO2: 95% 96%  ?  ?Last Pain:  ?Vitals:  ? 03/06/22 0446  ?TempSrc: Oral  ?PainSc:   ? ? ?  ?  ?  ?  ?  ?  ? ?[place] ? ? ? ? ?

## 2022-03-06 NOTE — Assessment & Plan Note (Addendum)
No clinical signs of infection. ?

## 2022-03-06 NOTE — Assessment & Plan Note (Addendum)
I am unclear to what extent the patient has MCI or dementia, but in my interactions with her she likely has 1 or the other, exacerbated at home possibly by Ativan and Norco (if she still has some of these), and here by anesthesia and analgesics.  CTH normal.  I see nothing other than expected sundowning in a post-op patient with MCI. ? ?Noted today, she has been taking Ativan four times daily "for pain". At home takes only QHS.  Not safe for home use, but certainly not safe to take four times daily. ? ?- Reduce ativan to 0.25 mg QHS PRN only ?

## 2022-03-06 NOTE — Progress Notes (Signed)
Progress Note  Patient Name: Lisa Larson Date of Encounter: 03/06/2022  Primary Cardiologist: Berniece Salines, DO   Subjective   Patient was seen and examined at her bedside.  Inpatient Medications    Scheduled Meds:  aspirin  81 mg Oral BID   diltiazem  120 mg Oral Daily   docusate sodium  100 mg Oral BID   feeding supplement  237 mL Oral BID BM   fluticasone  2 spray Each Nare Daily   gabapentin  300 mg Oral TID   multivitamin with minerals  1 tablet Oral Daily   predniSONE  5 mg Oral Q breakfast   senna  1 tablet Oral BID   sertraline  200 mg Oral Daily   Continuous Infusions:  lactated ringers 125 mL/hr at 03/05/22 1943   methocarbamol (ROBAXIN) IV     methocarbamol (ROBAXIN) IV     PRN Meds: acetaminophen, albuterol, fentaNYL (SUBLIMAZE) injection, HYDROcodone-acetaminophen, LORazepam, menthol-cetylpyridinium **OR** phenol, methocarbamol **OR** methocarbamol (ROBAXIN) IV, methocarbamol **OR** methocarbamol (ROBAXIN) IV, metoCLOPramide **OR** metoCLOPramide (REGLAN) injection, morphine injection, ondansetron **OR** ondansetron (ZOFRAN) IV   Vital Signs    Vitals:   03/05/22 1600 03/05/22 2143 03/05/22 2337 03/06/22 0446  BP:  114/82 100/67 119/87  Pulse:  72 89 82  Resp:  '16 16 16  '$ Temp:  98 F (36.7 C) 97.7 F (36.5 C) 97.7 F (36.5 C)  TempSrc:  Oral Oral Oral  SpO2:  94% 95% 96%  Weight: 69.1 kg     Height: 5' (1.524 m)       Intake/Output Summary (Last 24 hours) at 03/06/2022 1013 Last data filed at 03/06/2022 0600 Gross per 24 hour  Intake 440.06 ml  Output 400 ml  Net 40.06 ml   Filed Weights   03/04/22 1126 03/05/22 1600  Weight: 66.5 kg 69.1 kg    Telemetry    Atrial fibrillation - Personally Reviewed  ECG    None  - Personally Reviewed  Physical Exam    General: Comfortable, lying in bed. Head: Atraumatic, normal size  Eyes: PEERLA, EOMI  Neck: Supple, normal JVD Cardiac: Normal S1, S2; RRR; no murmurs, rubs, or  gallops Lungs: Clear to auscultation bilaterally Abd: Soft, nontender, no hepatomegaly  Ext: warm, no edema Musculoskeletal: No deformities, BUE and BLE strength normal and equal Skin: Warm and dry, no rashes   Neuro: Alert and oriented to person, place, time, and situation, CNII-XII grossly intact, no focal deficits  Psych: Normal mood and affect   Labs    Chemistry Recent Labs  Lab 03/03/22 1611 03/04/22 0454 03/05/22 0459 03/06/22 0437  NA 141 138 134* 135  K 4.0 3.5 5.0 4.0  CL 106 104 103 104  CO2 '26 25 23 24  '$ GLUCOSE 127* 97 166* 147*  BUN '19 16 16 21  '$ CREATININE 0.78 0.69 0.81 0.76  CALCIUM 9.5 9.0 8.7* 8.9  PROT 6.9  --   --   --   ALBUMIN 4.2  --   --   --   AST 32  --   --   --   ALT 21  --   --   --   ALKPHOS 46  --   --   --   BILITOT 0.4  --   --   --   GFRNONAA >60 >60 >60 >60  ANIONGAP '9 9 8 7     '$ Hematology Recent Labs  Lab 03/04/22 0454 03/05/22 0459 03/06/22 0437  WBC 9.0 9.4 13.0*  RBC  3.56* 3.20* 3.15*  HGB 11.5* 10.4* 10.2*  HCT 35.9* 34.0* 32.1*  MCV 100.8* 106.3* 101.9*  MCH 32.3 32.5 32.4  MCHC 32.0 30.6 31.8  RDW 12.9 12.8 12.8  PLT 156 PLATELET CLUMPS NOTED ON SMEAR, UNABLE TO ESTIMATE 120*    Cardiac EnzymesNo results for input(s): TROPONINI in the last 168 hours. No results for input(s): TROPIPOC in the last 168 hours.   BNPNo results for input(s): BNP, PROBNP in the last 168 hours.   DDimer No results for input(s): DDIMER in the last 168 hours.   Radiology    CT HEAD WO CONTRAST (5MM)  Result Date: 03/05/2022 CLINICAL DATA:  Altered mental status. EXAM: CT HEAD WITHOUT CONTRAST TECHNIQUE: Contiguous axial images were obtained from the base of the skull through the vertex without intravenous contrast. RADIATION DOSE REDUCTION: This exam was performed according to the departmental dose-optimization program which includes automated exposure control, adjustment of the mA and/or kV according to patient size and/or use of iterative  reconstruction technique. COMPARISON:  None. FINDINGS: Brain: There is moderate severity cerebral atrophy with widening of the extra-axial spaces and ventricular dilatation. There are areas of decreased attenuation within the white matter tracts of the supratentorial brain, consistent with microvascular disease changes. Vascular: No hyperdense vessel or unexpected calcification. Skull: Normal. Negative for fracture or focal lesion. Sinuses/Orbits: Chronic bilateral maxillary sinus wall thickening is seen. Other: None. IMPRESSION: 1. No acute intracranial abnormality. 2. Moderate severity cerebral atrophy and microvascular disease changes of the supratentorial brain. Electronically Signed   By: Virgina Norfolk M.D.   On: 03/05/2022 21:30   Pelvis Portable  Result Date: 03/04/2022 CLINICAL DATA:  Status post right intramedullary nail. EXAM: PORTABLE PELVIS 1-2 VIEWS COMPARISON:  Right hip x-ray 03/03/2022 FINDINGS: There is a new right femoral intramedullary nail and hip screw fixating an intratrochanteric fracture. Alignment is anatomic. No hardware loosening. There is lateral hip soft tissue swelling and air compatible with recent surgery. IMPRESSION: 1. New right femoral intramedullary nail and hip screw in anatomic alignment. Electronically Signed   By: Ronney Asters M.D.   On: 03/04/2022 22:48   DG C-Arm 1-60 Min-No Report  Result Date: 03/04/2022 Fluoroscopy was utilized by the requesting physician.  No radiographic interpretation.   ECHOCARDIOGRAM COMPLETE  Result Date: 03/04/2022    ECHOCARDIOGRAM REPORT   Patient Name:   Lisa Larson Date of Exam: 03/04/2022 Medical Rec #:  [id number]           Height:       60.0 in Accession #:    [id number]          Weight:       146.6 lb Date of Birth:  [birth year]           BSA:          1.636 m Patient Age:    81 years            BP:           114/64 mmHg Patient Gender: F                   HR:           74 bpm. Exam Location:  Inpatient Procedure: 2D Echo,  Cardiac Doppler and Color Doppler Indications:    R07.9 CHEST PAIN  History:        Patient has no prior history of Echocardiogram examinations.  COPD; Signs/Symptoms:Murmur.  Sonographer:    Beryle Beams Referring Phys: [id number] PAULA V ROSS IMPRESSIONS  1. Left ventricular ejection fraction, by estimation, is 60 to 65%. The left ventricle has normal function. The left ventricle has no regional wall motion abnormalities. Left ventricular diastolic function could not be evaluated.  2. Right ventricular systolic function is low normal. The right ventricular size is normal. There is normal pulmonary artery systolic pressure. The estimated right ventricular systolic pressure is 81.1 mmHg.  3. The mitral valve is abnormal. Trivial mitral valve regurgitation. Moderate mitral annular calcification.  4. Tricuspid valve regurgitation is moderate.  5. The aortic valve is tricuspid. Aortic valve regurgitation is not visualized.  6. The inferior vena cava is normal in size with <50% respiratory variability, suggesting right atrial pressure of 8 mmHg. Comparison(s): No prior Echocardiogram. FINDINGS  Left Ventricle: Left ventricular ejection fraction, by estimation, is 60 to 65%. The left ventricle has normal function. The left ventricle has no regional wall motion abnormalities. The left ventricular internal cavity size was normal in size. There is  no left ventricular hypertrophy. Left ventricular diastolic function could not be evaluated due to atrial fibrillation. Left ventricular diastolic function could not be evaluated. Right Ventricle: The right ventricular size is normal. No increase in right ventricular wall thickness. Right ventricular systolic function is low normal. There is normal pulmonary artery systolic pressure. The tricuspid regurgitant velocity is 2.60 m/s,  and with an assumed right atrial pressure of 8 mmHg, the estimated right ventricular systolic pressure is 91.4 mmHg. Left Atrium: Left  atrial size was normal in size. Right Atrium: Right atrial size was normal in size. Pericardium: There is no evidence of pericardial effusion. Mitral Valve: The mitral valve is abnormal. Moderate mitral annular calcification. Trivial mitral valve regurgitation. Tricuspid Valve: The tricuspid valve is grossly normal. Tricuspid valve regurgitation is moderate. Aortic Valve: The aortic valve is tricuspid. Aortic valve regurgitation is not visualized. Aortic valve mean gradient measures 3.0 mmHg. Aortic valve peak gradient measures 4.8 mmHg. Aortic valve area, by VTI measures 1.16 cm. Pulmonic Valve: The pulmonic valve was normal in structure. Pulmonic valve regurgitation is not visualized. Aorta: The aortic root and ascending aorta are structurally normal, with no evidence of dilitation. Venous: The inferior vena cava is normal in size with less than 50% respiratory variability, suggesting right atrial pressure of 8 mmHg. IAS/Shunts: No atrial level shunt detected by color flow Doppler.  LEFT VENTRICLE PLAX 2D LVIDd:         3.10 cm LVIDs:         2.00 cm LV PW:         0.70 cm LV IVS:        0.70 cm LVOT diam:     1.50 cm LV SV:         24 LV SV Index:   15 LVOT Area:     1.77 cm  LV Volumes (MOD) LV vol d, MOD A2C: 28.0 ml LV vol d, MOD A4C: 23.8 ml LV vol s, MOD A2C: 10.2 ml LV vol s, MOD A4C: 10.4 ml LV SV MOD A2C:     17.8 ml LV SV MOD A4C:     23.8 ml LV SV MOD BP:      15.7 ml RIGHT VENTRICLE            IVC RV S prime:     9.36 cm/s  IVC diam: 2.00 cm TAPSE (M-mode): 1.3 cm LEFT ATRIUM  Index        RIGHT ATRIUM           Index LA diam:        3.30 cm 2.02 cm/m   RA Area:     12.30 cm LA Vol (A2C):   33.4 ml 20.42 ml/m  RA Volume:   28.80 ml  17.61 ml/m LA Vol (A4C):   36.2 ml 22.13 ml/m LA Biplane Vol: 35.7 ml 21.82 ml/m  AORTIC VALVE                    PULMONIC VALVE AV Area (Vmax):    1.16 cm     PV Vmax:       0.51 m/s AV Area (Vmean):   1.15 cm     PV Vmean:      33.000 cm/s AV Area  (VTI):     1.16 cm     PV VTI:        0.080 m AV Vmax:           110.00 cm/s  PV Peak grad:  1.0 mmHg AV Vmean:          73.700 cm/s  PV Mean grad:  1.0 mmHg AV VTI:            0.211 m AV Peak Grad:      4.8 mmHg AV Mean Grad:      3.0 mmHg LVOT Vmax:         72.50 cm/s LVOT Vmean:        47.800 cm/s LVOT VTI:          0.138 m LVOT/AV VTI ratio: 0.65  AORTA Ao Root diam: 2.30 cm Ao Asc diam:  2.40 cm TRICUSPID VALVE TR Peak grad:   27.0 mmHg TR Mean grad:   19.0 mmHg TR Vmax:        260.00 cm/s TR Vmean:       205.0 cm/s  SHUNTS Systemic VTI:  0.14 m Systemic Diam: 1.50 cm Lyman Bishop MD Electronically signed by Lyman Bishop MD Signature Date/Time: 03/04/2022/4:52:33 PM    Final    DG FEMUR, MIN 2 VIEWS RIGHT  Result Date: 03/05/2022 CLINICAL DATA:  Surgery EXAM: RIGHT FEMUR 2 VIEWS COMPARISON:  None. FINDINGS: Intraoperative images during right intertrochanteric fracture fixation with intramedullary nail. Improved alignment. No evidence of immediate complication. Moderate right hip osteoarthritis. IMPRESSION: Intraoperative images during right intertrochanteric fracture ORIF. No evidence of immediate complication. Electronically Signed   By: Maurine Simmering M.D.   On: 03/05/2022 08:11    Cardiac Studies   TTE  1. Left ventricular ejection fraction, by estimation, is 60 to 65%. The left ventricle has normal function. The left ventricle has no regional  wall motion abnormalities. Left ventricular diastolic function could not be evaluated.   2. Right ventricular systolic function is low normal. The right ventricular size is normal. There is normal pulmonary artery systolic  pressure. The estimated right ventricular systolic pressure is 16.6 mmHg.   3. The mitral valve is abnormal. Trivial mitral valve regurgitation. Moderate mitral annular calcification.   4. Tricuspid valve regurgitation is moderate.   5. The aortic valve is tricuspid. Aortic valve regurgitation is not visualized.   6. The inferior vena  cava is normal in size with <50% respiratory  variability, suggesting right atrial pressure of 8 mmHg.   Patient Profile     81 y.o. female PMH significant for degenerative arthritis, anxiety disorder, essential hypertension, depression, atypical chest  pain, presented in the ED s/p fall at home today morning, consult for pre-op cardiac risk assessment for right hip fx s/p intramedullar nail intertronchantric 3/7 - Now with new onset atrial fibrillation.   Assessment & Plan    New onset paroxysmal atrial fibrillation - I saw the patient for the first time in 11/2021 at that time she was in sinus rhythm. During this hospitalization she was found to be in atrial fibrillation.She has been started on Cardizem and appears to be controlling her heart rate. I was again able to speak to the patient and her daughter at the bedside - she is not interested in an Anticoagulation at this time.  She understands the patient is high risk for stroke given her high CHA2DS2-VASc score.  We will continue a rate control strategy cannot do rhythm control strategy at this time patient is not agreeable for now for anticoagulation.  Hypertension-blood pressure is acceptable, continue with current antihypertensive regimen.  Arthritis status post right hip surgery - post op management per ortho team   [facility] will sign off.   Medication Recommendations: Cardizem 120 mg daily Other recommendations (labs, testing, etc): None Follow up as an outpatient:  She will plan her follow up visit with me. She did not want to commit to anything until she felt ready after she leaves the hospital.  For questions or updates, please contact [place] Please consult [email or web address] for contact info under Cardiology/STEMI.      Signed, Xavi Tomasik, DO  03/06/2022, 10:13 AM

## 2022-03-06 NOTE — Progress Notes (Signed)
Transition of Care (TOC) -30 day Note   ?  ?  ?Patient Details  ?Name: Lisa Larson ?MRN: [id number] ?Date of Birth: Jun 01, 1941 ?  ?Transition of Care (TOC) CM/SW Contact  ?Name: Tawanna Cooler, RN  ?Phone Number: [phone] ?Date: 03/06/2022 ?Time: 3212 ?  ?MUST ID: [id number] ?  ?To Whom it May Concern: ?  ?Please be advised that the above patient will require a short-term nursing home stay, anticipated 30 days or less rehabilitation and strengthening. The plan is for return home.  ?   ?

## 2022-03-06 NOTE — Care Management Important Message (Signed)
Important Message ? ?Patient Details IM Letter given to the Patient. ?Name: Lisa Larson ?MRN: [id number] ?Date of Birth: December 12, 1941 ? ? ?Medicare Important Message Given:  Yes ? ? ? ? ?Kerin Salen ?03/06/2022, 2:18 PM ?

## 2022-03-06 NOTE — Progress Notes (Signed)
?Progress Note ? ? ?Patient: Lisa Larson:[id number] DOB: [birth year] DOA: 03/03/2022     3 ?DOS: the patient was seen and examined on 03/06/2022 ?  ? ? ? ? ?Brief hospital course: ?Mrs. Pfannenstiel is a 81 y.o. F with HTN, anxiety who presented with fall.  She bent over to pick something from the ground and she lost balance and she landed on her right hip.  ? ?In the ER, X-ray showed right acute intertrochanteric femur fracture.  Patient is also found to have A-fib with RVR ? ? ?3/6: Admitted on Cardizem gtt.  Orthopedics and cardio consulted.   ?3/7: ORIF by Dr. Marlou Sa ? ? ? ? ? ?Assessment and Plan: ?* Closed femur fracture ([place]) ?- Per orthopedics ?- TTWB only on RLE ?- Aspirin only for DVT PPx ? ? ? ? ? ?Atrial fibrillation with RVR ([place]) ?TSH normal.  Echo without valvular disease.  ? ?CHA2DS2-Vasc 4.  Cardiology recommend DOAC, patient has deferred for now.  Transitioned off Dilt gtt to PO, maintaining rate control ?- Continue diltiazem ?- We have elected NOT to anticoagulate at this time ? ? ? ? ?Hallucinations ?I am unclear to what extent the patient has MCI or dementia, but in my interactions with her she likely has 1 or the other, exacerbated at home possibly by Ativan and Norco (if she still has some of these), and here by anesthesia and analgesics.  CTH normal.  I see nothing other than expected sundowning in a post-op patient with MCI. ? ?Leukocytosis ?Mild.  No clinical signs of infection. ?- Monitor WBC ? ?Hypomagnesemia ?Magnesium supplemented. ? ?Depression ?-Continue Zoloft  ? 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 ?-Continue prednisone  ? ?Essential hypertension ?BP normal ?-Hold Losartan ? ? ?Anxiety state ?- Continue Zoloft  ? ? ? ? ?  ? ?Subjective: Patient having some sundowning.  No palpitations, dyspnea, chest pain, leg swelling.  She is sore in the right hip. ? ?  ? ?Physical Exam: ?Vitals:  ?  03/05/22 2143 03/05/22 2337 03/06/22 0446 03/06/22 1214  ?BP: 114/82 100/67 119/87 122/85  ?Pulse: 72 89 82 93  ?Resp: '16 16 16 18  '$ ?Temp: 98 ?F (36.7 ?C) 97.7 ?F (36.5 ?C) 97.7 ?F (36.5 ?C) 97.6 ?F (36.4 ?C)  ?TempSrc: Oral Oral Oral   ?SpO2: 94% 95% 96% 93%  ?Weight:      ?Height:      ? ?Elderly adult female, lying in bed, interactive, no acute distress ?Anicteric, conjunctival pink, lids and lashes normal.  Nasal cannula in place. ?Heart rate controlled, rhythm irregular, no JVD, no peripheral edema ?Lung sounds clear, without rales or wheezes ?Attention distracted, she does not follow our conversation well, asks some inappropriate questions, judgment insight appear mildly impaired by dementia.  ? ? ? ? ?Data Reviewed: ?Cardiology and orthopedics notes reviewed.  Vital signs, nursing notes, laboratory studies and imaging reports were reviewed. ?CT head unremarkable except for microvascular change ?BMP normal, sodium normalized ?Complete blood count notable for mild leukocytosis, hemoglobin 10, no change.    ? ?Family Communication: Son  at the bedside ? ?Disposition: ?Status is: Inpatient ?Remains inpatient appropriate because: Has had a hip repair, she will require substantial rehabilitation to return to her prior level of function.  Arrangements for safe disposition are pending, likely Friday. ? Planned Discharge Destination: Skilled nursing facility ? ? ?  ? ?Author: ?Edwin Dada, MD ?03/06/2022 3:20 PM ? ?For on  call review [email or web address].  ?

## 2022-03-06 NOTE — Progress Notes (Signed)
Patient stable.  Pain is better today. ?CT head no acute findings ?Patient on aspirin for DVT prophylaxis.  She describes her father having a history of DVT but there is a little lack of clarity with that. ?Plan ultrasound rule out DVT and continue on aspirin for DVT prophylaxis.  Ready to go after tomorrow a.m. from Ortho standpoint. ?

## 2022-03-06 NOTE — [place] FL2 (Signed)
?[place] MEDICAID FL2 LEVEL OF CARE SCREENING TOOL  ?  ? ?IDENTIFICATION  ?Patient Name: ?EDIA PURSIFULL Birthdate: 1941-07-23 Sex: female Admission Date (Current Location): ?03/03/2022  ?[place] and [place] Number: ? [place] ?  Facility and Address:  ?[facility],  [place], [place] ?     Provider Number: ?[id number]  ?Attending Physician Name and Address:  ?Edwin Dada, * ? Relative Name and Phone Number:  [name], daughter [phone] ?   ?Current Level of Care: ?Hospital Recommended Level of Care: ?[place] Prior Approval Number: ?  ? ?Date Approved/Denied: ?  PASRR Number: ?  ? ?Discharge Plan: ?SNF ?  ? ?Current Diagnoses: ?Patient Active Problem List  ? Diagnosis Date Noted  ? Leukocytosis 03/06/2022  ? Hypomagnesemia 03/05/2022  ? Closed femur fracture ([place]) 03/03/2022  ? Atrial fibrillation with RVR ([place]) 03/03/2022  ? Severe persistent asthma 02/24/2021  ? Eosinophilic pneumonia ([place]) 82/80/0349  ? Urinary frequency 05/26/2020  ? Urinary incontinence 05/25/2020  ? Tingling 11/28/2019  ? 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 Candidiasis of skin 07/09/2018  ? Lightheadedness 07/09/2018  ? Hypotension 07/09/2018  ? Fall at home 12/27/2017  ? Osteoporosis 12/22/2016  ? 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 Family history of diabetes mellitus 09/22/2016  ? Cough 06/11/2016  ? UTI (urinary tract infection) 05/03/2016  ? Memory changes 06/27/2015  ? Depression 03/24/2015  ? Spine pain, multilevel 02/06/2015  ? Allergic rhinitis 04/17/2011  ? Hyperglycemia 11/28/2010  ? Asthma, persistent controlled 11/28/2010  ? GOUT, UNSPECIFIED 05/04/2009  ? Anxiety state 09/29/2008  ? Essential hypertension 08/09/2008  ? GERD 09/29/2007  ? Osteoarthritis, diffuse 06/09/2007  ? ? ?Orientation RESPIRATION BLADDER Height & Weight   ?  ?Self, Time, Situation, Place ? Normal Indwelling catheter Weight: 69.1 kg ?Height:  5' (152.4 cm)   ?BEHAVIORAL SYMPTOMS/MOOD NEUROLOGICAL BOWEL NUTRITION STATUS  ?    Continent Diet (Regular)  ?AMBULATORY STATUS COMMUNICATION OF NEEDS Skin   ?Limited Assist Verbally Surgical wounds (right hip fracture with surgical repair) ?  ?  ?  ?    ?     ?     ? ? ?Personal Care Assistance Level of Assistance  ?Bathing, Feeding, Dressing Bathing Assistance: Limited assistance ?Feeding assistance: Independent ?Dressing Assistance: Limited assistance ?   ? ?Functional Limitations Info  ?Sight, Hearing, Speech Sight Info: Impaired (glasses) ?Hearing Info: Adequate ?Speech Info: Adequate  ? ? ?SPECIAL CARE FACTORS FREQUENCY  ?PT (By licensed PT), OT (By licensed OT)   ?  ?PT Frequency: 5x/week ?OT Frequency: 5x/week ?  ?  ?  ?   ? ? ?Contractures Contractures Info: Not present  ? ? ?Additional Factors Info  ?Code Status, Allergies Code Status Info: Full ?Allergies Info: sulfanomides, oxycodone, diclofenac, prolia, rofecoxib, sulfa antibiotics, ace inhibitors, benazepril, tramadol ?  ?  ?  ?   ? ?Current Medications (03/06/2022):  This is the current hospital active medication list ?Current Facility-Administered Medications  ?Medication Dose Route Frequency Provider Last Rate Last Admin  ? acetaminophen (TYLENOL) tablet 325-650 mg  325-650 mg Oral Q6H PRN Magnant, Charles L, PA-C      ? albuterol (PROVENTIL) (2.5 MG/3ML) 0.083% nebulizer solution 2.5 mg  2.5 mg Nebulization Q6H PRN Suzzanne Cloud, RPH      ? aspirin chewable tablet 81 mg  81 mg Oral BID Magnant, Charles L, PA-C   81 mg at 03/06/22 1033  ? diltiazem (CARDIZEM CD) 24 hr capsule 120 mg  120 mg Oral Daily Janina Mayo, MD   120 mg at 03/06/22 1033  ? docusate sodium (COLACE) capsule 100 mg  100 mg Oral BID Magnant, Charles L, PA-C   100 mg at 03/06/22 1033  ? feeding supplement (ENSURE SURGERY) liquid 237 mL  237 mL Oral BID BM Shawna Clamp, MD   237 mL at 03/06/22 1034  ? fentaNYL (SUBLIMAZE) injection 25 mcg  25 mcg Intravenous Q6H PRN Shawna Clamp, MD   25  mcg at 03/04/22 1554  ? fluticasone (FLONASE) 50 MCG/ACT nasal spray 2 spray  2 spray Each Nare Daily Shawna Clamp, MD   2 spray at 03/06/22 1034  ? gabapentin (NEURONTIN) capsule 300 mg  300 mg Oral TID Godfrey Pick, MD   300 mg at 03/06/22 1034  ? HYDROcodone-acetaminophen (NORCO/VICODIN) 5-325 MG per tablet 1-2 tablet  1-2 tablet Oral Q4H PRN Magnant, Charles L, PA-C   1 tablet at 03/06/22 1036  ? lactated ringers infusion   Intravenous Continuous Godfrey Pick, MD 125 mL/hr at 03/05/22 1943 New Bag at 03/05/22 1943  ? LORazepam (ATIVAN) tablet 0.5 mg  0.5 mg Oral Q6H PRN Shawna Clamp, MD   0.5 mg at 03/06/22 1036  ? menthol-cetylpyridinium (CEPACOL) lozenge 3 mg  1 lozenge Oral PRN Magnant, Charles L, PA-C      ? Or  ? phenol (CHLORASEPTIC) mouth spray 1 spray  1 spray Mouth/Throat PRN Magnant, Charles L, PA-C      ? methocarbamol (ROBAXIN) tablet 500 mg  500 mg Oral Q6H PRN Shawna Clamp, MD   500 mg at 03/04/22 0414  ? Or  ? methocarbamol (ROBAXIN) 500 mg in dextrose 5 % 50 mL IVPB  500 mg Intravenous Q6H PRN Shawna Clamp, MD      ? methocarbamol (ROBAXIN) tablet 500 mg  500 mg Oral Q6H PRN Magnant, Gerrianne Scale, PA-C      ? Or  ? methocarbamol (ROBAXIN) 500 mg in dextrose 5 % 50 mL IVPB  500 mg Intravenous Q6H PRN Magnant, Charles L, PA-C      ? metoCLOPramide (REGLAN) tablet 5-10 mg  5-10 mg Oral Q8H PRN Magnant, Charles L, PA-C      ? Or  ? metoCLOPramide (REGLAN) injection 5-10 mg  5-10 mg Intravenous Q8H PRN Magnant, Charles L, PA-C      ? morphine (PF) 2 MG/ML injection 0.5 mg  0.5 mg Intravenous Q2H PRN Magnant, Charles L, PA-C   0.5 mg at 03/05/22 0347  ? multivitamin with minerals tablet 1 tablet  1 tablet Oral Daily Shawna Clamp, MD   1 tablet at 03/06/22 1033  ? ondansetron (ZOFRAN) tablet 4 mg  4 mg Oral Q6H PRN Magnant, Charles L, PA-C      ? Or  ? ondansetron (ZOFRAN) injection 4 mg  4 mg Intravenous Q6H PRN Magnant, Charles L, PA-C      ? predniSONE (DELTASONE) tablet 5 mg  5 mg Oral Q  breakfast Godfrey Pick, MD   5 mg at 03/06/22 1033  ? senna (SENOKOT) tablet 8.6 mg  1 tablet Oral BID Shawna Clamp, MD   8.6 mg at 03/06/22 1034  ? sertraline (ZOLOFT) tablet 200 mg  200 mg Oral Daily Godfrey Pick, MD   200 mg at 03/06/22 1033  ? ? ? ?Discharge Medications: ?Please see discharge summary for a list of discharge medications. ? ?Relevant Imaging Results: ? ?Relevant Lab Results: ? ? ?Additional Information ?No covid vaccines on file; SS# [id number] ? ?[place] Bailiff  Fatima Sanger, RN ? ? ? ? ?

## 2022-03-06 NOTE — Evaluation (Signed)
Occupational Therapy Evaluation Patient Details Name: Lisa Larson MRN: [id number] DOB: April 27, 1941 Today's Date: 03/06/2022   History of Present Illness Pt. is a 81 y.o. female presenting to [facility] on 3/6 after a fall at home as well as 1 week history of diarrhea. She presents with a R hip fracture and had a R hip ORIF on 3/7 (NWB gait, TTWB transfers). Now also being evaled for Afib and RVR. PMH significant for COPD, remote tob, HTN, GERD, osteopenia, OA, HA, depression, anxiety.   Clinical Impression   Lisa Larson is a 81 year old woman who presents s/p hip surgery with TDWB status, decreased ROM and strength in RLE, edema, impaired balance, generalized weakness, decreased activity tolerance and pain resulting in sudden decline in functional abilities. Patient total assist to transfer to edge of bed due to significant pain with minimal movement. She is mod x 2 to stand for transfers and ADLs and for pivot to [facility] then recliner. Attempted use of walker for transfer from [facility] to recliner but with significant improvement in ability to assist with transfer. She is mostly limited by pain - as she is unable to focus on weight bearing status and effectively use her body to transfer or compute the cues therapist is giving her. Patient's RLE edematous so therapist instructed patient on exercises to perform while in chair and provided ice packs. Educated patient and son in regards to both. Patient will benefit from skilled OT services while in hospital to improve deficits and learn compensatory strategies as needed in order to return to PLOF.  Recommend short term rehab at discharge.     Recommendations for follow up therapy are one component of a multi-disciplinary discharge planning process, led by the attending physician.  Recommendations may be updated based on patient status, additional functional criteria and insurance authorization.   Follow Up Recommendations  Skilled nursing-short term  rehab (<3 hours/day)    Assistance Recommended at Discharge    Patient can return home with the following Two people to help with walking and/or transfers;Two people to help with bathing/dressing/bathroom;Assistance with cooking/housework;Help with stairs or ramp for entrance;Direct supervision/assist for medications management;Direct supervision/assist for financial management    Functional Status Assessment  Patient has had a recent decline in their functional status and demonstrates the ability to make significant improvements in function in a reasonable and predictable amount of time.  Equipment Recommendations   (Defer to next venue)    Recommendations for Other Services       Precautions / Restrictions Precautions Precautions: Fall Precaution Comments: Pt. unable to verbally state precautions w/o cueing Restrictions Weight Bearing Restrictions: Yes RLE Weight Bearing: Partial weight bearing Other Position/Activity Restrictions: Per ortho note: Okay for touchdown to partial weightbearing for transfers.  Would not want her walking on the leg yet due to relatively poor bone quality      Mobility Bed Mobility                    Transfers                          Balance Overall balance assessment: Needs assistance Sitting-balance support: No upper extremity supported Sitting balance-Leahy Scale: Good     Standing balance support: During functional activity Standing balance-Leahy Scale: Poor                             ADL either  performed or assessed with clinical judgement   ADL Overall ADL's : Needs assistance/impaired Eating/Feeding: Independent   Grooming: Set up;Sitting   Upper Body Bathing: Set up;Sitting   Lower Body Bathing: Maximal assistance;Sitting/lateral leans   Upper Body Dressing : Set up;Sitting   Lower Body Dressing: Total assistance;+2 for physical assistance;Sit to/from stand   Toilet Transfer: Total  assistance;+2 for physical assistance;BSC/3in1;Stand-pivot;Rolling walker (2 wheels)   Toileting- Clothing Manipulation and Hygiene: +2 for physical assistance;Total assistance       Functional mobility during ADLs: Moderate assistance;+2 for physical assistance General ADL Comments: Mod x 2 to stand and manage patient to pivot to [facility] and then to recliner. Patient hollering due to the pain. Unsure how well she is maintaining weight bearing precautions. Unable to effectively use UEs on walker to assist wtih offloading weight.     Vision Baseline Vision/History: 1 Wears glasses       Perception     Praxis      Pertinent Vitals/Pain Pain Assessment Pain Assessment: Faces Faces Pain Scale: Hurts whole lot Pain Location: with transfers/weight bearing Pain Descriptors / Indicators: Aching, Guarding, Tender, Sore, Stabbing, Sharp, Grimacing, Moaning Pain Intervention(s): Premedicated before session     Hand Dominance Left   Extremity/Trunk Assessment Upper Extremity Assessment Upper Extremity Assessment: RUE deficits/detail;LUE deficits/detail RUE Deficits / Details: WFL ROM, grossly 4-/5 strength throughout - arthritic changes in hand, crepitus in shoulder RUE Sensation: WNL RUE Coordination: WNL LUE Deficits / Details: Hx of humeral fracture fracture but grossly functional ROM, 3+/5 shoulder otherwise 4-/5 strength throughout, arthritic  changes in hands LUE Sensation: WNL LUE Coordination: WNL   Lower Extremity Assessment Lower Extremity Assessment: Defer to PT evaluation   Cervical / Trunk Assessment Cervical / Trunk Assessment: Kyphotic   Communication Communication Communication: No difficulties   Cognition Arousal/Alertness: Awake/alert Behavior During Therapy: Anxious Overall Cognitive Status: Impaired/Different from baseline Area of Impairment: Memory, Safety/judgement                   Current Attention Level: Selective Memory: Decreased recall of  precautions, Decreased short-term memory Following Commands: Follows one step commands consistently Safety/Judgement: Decreased awareness of safety Awareness: Anticipatory   General Comments: Anxious and scared of the pain. states "I can't walk on it" in regards to precautions.     General Comments       Exercises     Shoulder Instructions      Home Living Family/patient expects to be discharged to:: Private residence Living Arrangements: Alone Available Help at Discharge: Family;Available PRN/intermittently (Son and Daughter able to come help but they both work) Type of Home: House Home Access: Ramped entrance     [place]: One level     Bathroom Shower/Tub: Occupational psychologist: Standard Bathroom Accessibility: Yes   Home Equipment: [place] - built in;Grab bars - tub/shower;Rollator (4 wheels);Cane - single point          Prior Functioning/Environment Prior Level of Function : Needs assist             Mobility Comments: Uses rollator at baseline ADLs Comments: Doesn't cook at [place], has a hard time getting dressed        OT Problem List: Decreased strength;Decreased range of motion;Decreased activity tolerance;Impaired balance (sitting and/or standing);Decreased safety awareness;Decreased cognition;Decreased knowledge of use of DME or AE;Decreased knowledge of precautions;Pain      OT Treatment/Interventions: Self-care/ADL training;Therapeutic exercise;DME and/or AE instruction;Therapeutic activities;Balance training;Patient/family education    OT Goals(Current goals  can be found in the care plan section) Acute Rehab OT Goals Patient Stated Goal: transfer herself OT Goal Formulation: With patient Time For Goal Achievement: 03/20/22 Potential to Achieve Goals: Fair  OT Frequency: Min 2X/week    Co-evaluation              AM-PAC OT "6 Clicks" Daily Activity     Outcome Measure Help from another person eating meals?:  None Help from another person taking care of personal grooming?: A Little Help from another person toileting, which includes using toliet, bedpan, or urinal?: Total Help from another person bathing (including washing, rinsing, drying)?: A Lot Help from another person to put on and taking off regular upper body clothing?: A Little Help from another person to put on and taking off regular lower body clothing?: Total 6 Click Score: 14   End of Session Equipment Utilized During Treatment: Gait belt;Rolling walker (2 wheels) Nurse Communication: Mobility status  Activity Tolerance: Patient limited by pain Patient left: in chair;with call bell/phone within reach;with chair alarm set;with family/visitor present  OT Visit Diagnosis: Other abnormalities of gait and mobility (R26.89);Pain Pain - Right/Left: Right Pain - part of body: Hip                Time: 9449-6759 OT Time Calculation (min): 44 min Charges:  OT General Charges $OT Visit: 1 Visit OT Evaluation $OT Eval Moderate Complexity: 1 Mod OT Treatments $Self Care/Home Management : 8-22 mins  Jovie Swanner, OTR/L [place]  Office [phone] Pager: [phone]   Lenward Chancellor 03/06/2022, 12:09 PM

## 2022-03-07 ENCOUNTER — Telehealth: Payer: Self-pay

## 2022-03-07 ENCOUNTER — Inpatient Hospital Stay (HOSPITAL_COMMUNITY): Payer: Medicare Other

## 2022-03-07 DIAGNOSIS — M7989 Other specified soft tissue disorders: Secondary | ICD-10-CM

## 2022-03-07 MED ORDER — BISACODYL 10 MG RE SUPP
10.0000 mg | Freq: Every day | RECTAL | Status: DC | PRN
Start: 2022-03-07 — End: 2022-03-08
  Administered 2022-03-08: 10 mg via RECTAL
  Filled 2022-03-07: qty 1

## 2022-03-07 MED ORDER — POLYETHYLENE GLYCOL 3350 17 G PO PACK
17.0000 g | PACK | Freq: Two times a day (BID) | ORAL | Status: DC | PRN
Start: 2022-03-07 — End: 2022-03-08
  Administered 2022-03-07 – 2022-03-08 (×2): 17 g via ORAL
  Filled 2022-03-07 (×2): qty 1

## 2022-03-07 MED ORDER — LORAZEPAM 0.5 MG PO TABS
0.2500 mg | ORAL_TABLET | Freq: Every evening | ORAL | Status: DC | PRN
  Administered 2022-03-07 – 2022-03-08 (×2): 0.25 mg via ORAL
  Filled 2022-03-07 (×2): qty 1

## 2022-03-07 NOTE — Progress Notes (Signed)
Bilateral lower extremity venous duplex has been completed. ?Preliminary results can be found in CV Proc through chart review.  ? ?03/07/22 1:26 PM ?Lisa Larson RVT   ?

## 2022-03-07 NOTE — Progress Notes (Addendum)
Met with patient and her son at bedside regarding bed offers.  They will discuss with daughter and let CM know their choice.  ? ?Addendum: Patient's daughter, [name], strongly prefers [facility] SNF.  [facility] hasn't accepted patient, yet.  CM called rep at [facility] and left message, but no response.  Daughter, [name], called also and left a message, no response.   ?Choices are: 1. [facility] if they accept.  2. [facility]. 3. [facility].  ? ?Addendum: Received a call from patient's daughter, [name], who states she went to [facility].  States [facility] only has a semi-private room available, and they cannot offer it to patient because patient is not vaccinated against covid-19.  CM let patient know that CM had messaged [facility] and they do not have a bed available tomorrow.  [name] verbalized understanding.  CM has messaged the rep at [facility] to see if they have a bed tomorrow and [name] agreeable.  Pending response from [place].  ? ?Addendum: [facility] does not have a bed until Monday.  CM called daughter [name] for another choice.  Next choice is [facility].  Pending response from [place] at [facility] to see if they have a bed available tomorrow.   ? ?Addendum: [facility] has no bed until Monday.  [facility] has no bed until Monday.  [facility] has a bed available tomorrow.  Called [name] to let her know.  Explained that if patient is medically ready to discharge and there is a bed available at an accepting SNF, the expectation is that the patient will go, even if not first choice.  [name] verbalized understanding.  Does not want to make a choice between [facility], [facility], and [facility] at this time.  Wants to talk it over with her brother and the patient. She is aware of the Medicare right to appeal the discharge.   ?

## 2022-03-07 NOTE — Progress Notes (Signed)
?Progress Note ? ? ?Patient: Lisa Larson BHA:[id number] DOB: 16-Jun-1941 DOA: 03/03/2022     4 ?DOS: the patient was seen and examined on 03/07/2022 ?  ? ? ? ? ?Brief hospital course: ?Lisa Larson is a 81 y.o. F with HTN, anxiety who presented with fall.  She bent over to pick something from the ground and she lost balance and she landed on her right hip.  ? ?In the ER, X-ray showed right acute intertrochanteric femur fracture.  Patient is also found to have A-fib with RVR ? ? ?3/6: Admitted on Cardizem gtt.  Orthopedics and cardio consulted.   ?3/7: ORIF by Dr. Marlou Sa ? ? ? ? ? ?Assessment and Plan: ?* Closed femur fracture ([place]) ?[place] right leg negative for DVT ?- Per orthopedics ?- TTWB only on RLE ?- Aspirin only for DVT PPx ? ? ? ? ? ?Atrial fibrillation with RVR ([place]) ?TSH normal.  Echo without valvular disease.  ? ?CHA2DS2-Vasc 4.  Cardiology recommend DOAC, patient has deferred for now.  Transitioned off Dilt gtt to PO, maintaining rate control ?- Continue diltiazem ?- We have elected NOT to anticoagulate at this time ? ? ? ? ?Hallucinations ?I am unclear to what extent the patient has MCI or dementia, but in my interactions with her she likely has 1 or the other, exacerbated at home possibly by Ativan and Norco (if she still has some of these), and here by anesthesia and analgesics.  CTH normal.  I see nothing other than expected sundowning in a post-op patient with MCI. ? ?Noted today, she has been taking Ativan four times daily "for pain". At home takes only QHS.  Not safe for home use, but certainly not safe to take four times daily. ? ?- Reduce ativan to 0.25 mg QHS PRN only ? ?Leukocytosis ?No clinical signs of infection. ? ?Hypomagnesemia ?Magnesium supplemented. ? ?Depression ?-Continue Zoloft  ? 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 ?-Continue prednisone  ? ?Essential hypertension ?BP normal ?-Hold  Losartan ? ? ?Anxiety state ?- Continue Zoloft  ? ? ? ? ?  ? ?Subjective: The right leg is red and swollen.  Still little confusion, this now appears to be from Ativan.  No chest pain, dyspnea, fever, cough, dyspnea. ? ?  ? ?Physical Exam: ?Vitals:  ? 03/06/22 1214 03/06/22 2033 03/07/22 0512 03/07/22 1337  ?BP: 122/85 121/79 127/86 118/77  ?Pulse: 93 83 82 87  ?Resp: '18 18 16 20  '$ ?Temp: 97.6 ?F (36.4 ?C) 97.8 ?F (36.6 ?C) 97.6 ?F (36.4 ?C) 97.8 ?F (36.6 ?C)  ?TempSrc:  Oral Oral Oral  ?SpO2: 93% 95% 96%   ?Weight:      ?Height:      ? ?Elderly adult female, lying in bed, eating breakfast, interactive and appropriate. ?Heart rate controlled, rhythm irregular, no JVD, the right leg is somewhat red and swollen, no pitting edema ?Lung sounds clear, no rales or wheezes ?Attention somewhat distracted, occasionally repeats herself, judgment insight appear mildly impaired by dementia   ? ? ? ? ?Data Reviewed: ?Orthopedics notes reviewed, vital signs, nursing notes, and ultrasound report reviewed. ?Doppler ultrasound shows no DVT cardiology and orthopedics notes reviewed.     ? ?Family Communication: Son  at the bedside ? ?Disposition: ?Status is: Inpatient ?Remains inpatient appropriate because: Has had a hip repair, she will require substantial rehabilitation to return to her prior level of function.  Arrangements for safe disposition are pending, likely  Friday. ? ? Planned Discharge Destination: Skilled nursing facility ? ? ?  ? ?Author: ?Edwin Dada, MD ?03/07/2022 3:03 PM ? ?For on call review [email or web address].  ?

## 2022-03-07 NOTE — Progress Notes (Signed)
Physical Therapy Treatment ?Patient Details ?Name: Lisa Larson ?MRN: [id number] ?DOB: 1941-05-26 ?Today's Date: 03/07/2022 ? ? ?History of Present Illness Pt. is a 81 y.o. female presenting [facility] on 3/6 after a fall at home as well as 1 week history of diarrhea. She presents with a R hip fracture and had a R hip ORIF on 3/7 (NWB gait, TTWB transfers). Now also being evaled for Afib and RVR. PMH significant for COPD, remote tob, HTN, GERD, osteopenia, OA, HA, depression, anxiety. ? ?  ?PT Comments  ? ? Pt with slight improvement in transfers but still remains limited by pain and required mod cues for sequencing and WB precautions.  Continues to require 2 people to assist - recommend SNF.  Continue plan of care   ?Recommendations for follow up therapy are one component of a multi-disciplinary discharge planning process, led by the attending physician.  Recommendations may be updated based on patient status, additional functional criteria and insurance authorization. ? ?Follow Up Recommendations ? Skilled nursing-short term rehab (<3 hours/day) ?  ?  ?Assistance Recommended at Discharge Frequent or constant Supervision/Assistance  ?Patient can return home with the following Assist for transportation;Assistance with cooking/housework;Direct supervision/assist for financial management;Help with stairs or ramp for entrance;Two people to help with walking and/or transfers;Two people to help with bathing/dressing/bathroom ?  ?Equipment Recommendations ? Rolling walker (2 wheels);Wheelchair (measurements PT);Wheelchair cushion (measurements PT);BSC/3in1 (further assessment SNF)  ?  ?Recommendations for Other Services   ? ? ?  ?Precautions / Restrictions Precautions ?Precautions: Fall ?Restrictions ?Weight Bearing Restrictions: Yes ?RLE Weight Bearing: Partial weight bearing ?Other Position/Activity Restrictions: Per ortho note: Okay for touchdown to partial weightbearing for transfers.  Would not want her walking on  the leg yet due to relatively poor bone quality  ?  ? ?Mobility ? Bed Mobility ?Overal bed mobility: Needs Assistance ?Bed Mobility: Supine to Sit, Sit to Supine ?  ?  ?Supine to sit: Mod assist, +2 for physical assistance ?Sit to supine: Max assist, +2 for physical assistance ?  ?General bed mobility comments: For supine to sit: PT gently lifting R leg and pt gradually moving toward EOB with heavy use of L leg to push, once buttock near EOB requiring mod A of 2 to lift trunk and guard/assist R leg.  For sit to supine: helicopter technique ?  ? ?Transfers ?Overall transfer level: Needs assistance ?Equipment used: Rolling walker (2 wheels) ?Transfers: Sit to/from Stand, Bed to chair/wheelchair/BSC ?Sit to Stand: +2 physical assistance, Mod assist ?Stand pivot transfers: Mod assist, +2 physical assistance ?  ?  ?  ?  ?General transfer comment: Pt cued for hand placement and coming up on L LE with limited weight on R LE.  Pt then pivoted on L LE toward [facility] with mod A of 2 with assist for balance and RW with cues for PWB/TTWB on R LE. Did not go to chair b/c while at EOB U/S tech arrived for LE doppler -so pt returned to supine ?  ? ?Ambulation/Gait ?  ?  ?  ?  ?  ?  ?  ?General Gait Details: n/a ? ? ?Stairs ?  ?  ?  ?  ?  ? ? ?Wheelchair Mobility ?  ? ?Modified Rankin (Stroke Patients Only) ?  ? ? ?  ?Balance Overall balance assessment: Needs assistance ?Sitting-balance support: No upper extremity supported ?Sitting balance-Leahy Scale: Good ?  ?  ?Standing balance support: Bilateral upper extremity supported ?Standing balance-Leahy Scale: Poor ?Standing balance comment: Requiring RW  and min-mod A ?  ?  ?  ?  ?  ?  ?  ?  ?  ?  ?  ?  ? ?  ?Cognition Arousal/Alertness: Awake/alert ?Behavior During Therapy: Anxious ?Overall Cognitive Status: Impaired/Different from baseline ?Area of Impairment: Memory, Safety/judgement ?  ?  ?  ?  ?  ?  ?  ?  ?  ?  ?Memory: Decreased recall of precautions, Decreased short-term memory ?   ?Safety/Judgement: Decreased awareness of safety ?  ?  ?  ?  ?  ? ?  ?Exercises General Exercises - Lower Extremity ?Ankle Circles/Pumps: AROM, Both, 10 reps ?Quad Sets: AROM, Both, 10 reps, Supine ?Long Arc Quad: Supine, 20 reps, AROM, Left, AAROM, Right ? ?  ?General Comments   ?  ?  ? ?Pertinent Vitals/Pain Pain Assessment ?Pain Assessment: Faces ?Faces Pain Scale: Hurts whole lot ?Pain Location: with transfers/weight bearing ?Pain Descriptors / Indicators: Grimacing, Discomfort, Sharp, Guarding ?Pain Intervention(s): Limited activity within patient's tolerance, Monitored during session, Premedicated before session, Ice applied, Other (comment) (Very gentle movements but pt still reports feels like "jerking")  ? ? ?Home Living   ?  ?  ?  ?  ?  ?  ?  ?  ?  ?   ?  ?Prior Function    ?  ?  ?   ? ?PT Goals (current goals can now be found in the care plan section) Progress towards PT goals: Progressing toward goals ? ?  ?Frequency ? ? ? Min 3X/week ? ? ? ?  ?PT Plan Current plan remains appropriate  ? ? ?Co-evaluation   ?  ?  ?  ?  ? ?  ?AM-PAC PT "6 Clicks" Mobility   ?Outcome Measure ? Help needed turning from your back to your side while in a flat bed without using bedrails?: A Lot ?Help needed moving from lying on your back to sitting on the side of a flat bed without using bedrails?: Total ?Help needed moving to and from a bed to a chair (including a wheelchair)?: Total ?Help needed standing up from a chair using your arms (e.g., wheelchair or bedside chair)?: Total ?Help needed to walk in hospital room?: Total ?Help needed climbing 3-5 steps with a railing? : Total ?6 Click Score: 7 ? ?  ?End of Session Equipment Utilized During Treatment: Gait belt;Oxygen ?Activity Tolerance: Patient limited by pain;Other (comment) (U/S tech coming to perform doppler) ?Patient left: with call bell/phone within reach;with family/visitor present;in bed;with bed alarm set ?Nurse Communication: Mobility status ?PT Visit Diagnosis:  Unsteadiness on feet (R26.81);Muscle weakness (generalized) (M62.81);Pain ?  ? ? ?Time: 1010-1035 ?PT Time Calculation (min) (ACUTE ONLY): 25 min ? ?Charges:  $Therapeutic Exercise: 8-22 mins ?$Therapeutic Activity: 8-22 mins          ?          ? ?Abran Richard, PT ?Acute Rehab Services ?Pager [phone] ?[facility] [phone] ? ? ? ?Mikael Spray Laurelin Elson ?03/07/2022, 12:03 PM ? ?

## 2022-03-07 NOTE — Telephone Encounter (Signed)
Patients daughter called into the office stating that they would like to speak with Dr. Marlou Sa stating that she isn't ready to be discharged quite yet.  ? ?Please advise  ?

## 2022-03-08 DIAGNOSIS — R4182 Altered mental status, unspecified: Secondary | ICD-10-CM | POA: Diagnosis not present

## 2022-03-08 DIAGNOSIS — J45998 Other asthma: Secondary | ICD-10-CM | POA: Diagnosis not present

## 2022-03-08 DIAGNOSIS — N390 Urinary tract infection, site not specified: Secondary | ICD-10-CM | POA: Diagnosis not present

## 2022-03-08 DIAGNOSIS — M1711 Unilateral primary osteoarthritis, right knee: Secondary | ICD-10-CM | POA: Diagnosis not present

## 2022-03-08 DIAGNOSIS — S72141D Displaced intertrochanteric fracture of right femur, subsequent encounter for closed fracture with routine healing: Secondary | ICD-10-CM | POA: Diagnosis not present

## 2022-03-08 DIAGNOSIS — L304 Erythema intertrigo: Secondary | ICD-10-CM | POA: Diagnosis not present

## 2022-03-08 DIAGNOSIS — K5900 Constipation, unspecified: Secondary | ICD-10-CM | POA: Diagnosis not present

## 2022-03-08 DIAGNOSIS — D649 Anemia, unspecified: Secondary | ICD-10-CM | POA: Diagnosis not present

## 2022-03-08 DIAGNOSIS — F32A Depression, unspecified: Secondary | ICD-10-CM | POA: Diagnosis not present

## 2022-03-08 DIAGNOSIS — Z4889 Encounter for other specified surgical aftercare: Secondary | ICD-10-CM | POA: Diagnosis not present

## 2022-03-08 DIAGNOSIS — M2550 Pain in unspecified joint: Secondary | ICD-10-CM | POA: Diagnosis not present

## 2022-03-08 DIAGNOSIS — Z7401 Bed confinement status: Secondary | ICD-10-CM | POA: Diagnosis not present

## 2022-03-08 DIAGNOSIS — K219 Gastro-esophageal reflux disease without esophagitis: Secondary | ICD-10-CM | POA: Diagnosis not present

## 2022-03-08 DIAGNOSIS — M5136 Other intervertebral disc degeneration, lumbar region: Secondary | ICD-10-CM | POA: Diagnosis not present

## 2022-03-08 DIAGNOSIS — S72144A Nondisplaced intertrochanteric fracture of right femur, initial encounter for closed fracture: Secondary | ICD-10-CM | POA: Diagnosis not present

## 2022-03-08 DIAGNOSIS — R059 Cough, unspecified: Secondary | ICD-10-CM | POA: Diagnosis not present

## 2022-03-08 DIAGNOSIS — J4550 Severe persistent asthma, uncomplicated: Secondary | ICD-10-CM | POA: Diagnosis not present

## 2022-03-08 DIAGNOSIS — I10 Essential (primary) hypertension: Secondary | ICD-10-CM | POA: Diagnosis not present

## 2022-03-08 DIAGNOSIS — J8281 Chronic eosinophilic pneumonia: Secondary | ICD-10-CM | POA: Diagnosis not present

## 2022-03-08 DIAGNOSIS — J302 Other seasonal allergic rhinitis: Secondary | ICD-10-CM | POA: Diagnosis not present

## 2022-03-08 DIAGNOSIS — J309 Allergic rhinitis, unspecified: Secondary | ICD-10-CM | POA: Diagnosis not present

## 2022-03-08 DIAGNOSIS — F5105 Insomnia due to other mental disorder: Secondary | ICD-10-CM | POA: Diagnosis not present

## 2022-03-08 DIAGNOSIS — G319 Degenerative disease of nervous system, unspecified: Secondary | ICD-10-CM | POA: Diagnosis not present

## 2022-03-08 DIAGNOSIS — M79604 Pain in right leg: Secondary | ICD-10-CM | POA: Diagnosis not present

## 2022-03-08 DIAGNOSIS — F419 Anxiety disorder, unspecified: Secondary | ICD-10-CM | POA: Diagnosis not present

## 2022-03-08 DIAGNOSIS — M1611 Unilateral primary osteoarthritis, right hip: Secondary | ICD-10-CM | POA: Diagnosis not present

## 2022-03-08 DIAGNOSIS — I4891 Unspecified atrial fibrillation: Secondary | ICD-10-CM | POA: Diagnosis not present

## 2022-03-08 DIAGNOSIS — Z1159 Encounter for screening for other viral diseases: Secondary | ICD-10-CM | POA: Diagnosis not present

## 2022-03-08 DIAGNOSIS — F0390 Unspecified dementia without behavioral disturbance: Secondary | ICD-10-CM | POA: Diagnosis not present

## 2022-03-08 DIAGNOSIS — R443 Hallucinations, unspecified: Secondary | ICD-10-CM | POA: Diagnosis not present

## 2022-03-08 DIAGNOSIS — F411 Generalized anxiety disorder: Secondary | ICD-10-CM | POA: Diagnosis not present

## 2022-03-08 DIAGNOSIS — M810 Age-related osteoporosis without current pathological fracture: Secondary | ICD-10-CM | POA: Diagnosis not present

## 2022-03-08 DIAGNOSIS — J449 Chronic obstructive pulmonary disease, unspecified: Secondary | ICD-10-CM | POA: Diagnosis not present

## 2022-03-08 DIAGNOSIS — M109 Gout, unspecified: Secondary | ICD-10-CM | POA: Diagnosis not present

## 2022-03-08 DIAGNOSIS — M170 Bilateral primary osteoarthritis of knee: Secondary | ICD-10-CM | POA: Diagnosis not present

## 2022-03-08 DIAGNOSIS — R41841 Cognitive communication deficit: Secondary | ICD-10-CM | POA: Diagnosis not present

## 2022-03-08 DIAGNOSIS — F3289 Other specified depressive episodes: Secondary | ICD-10-CM | POA: Diagnosis not present

## 2022-03-08 DIAGNOSIS — G3184 Mild cognitive impairment, so stated: Secondary | ICD-10-CM | POA: Diagnosis not present

## 2022-03-08 DIAGNOSIS — E46 Unspecified protein-calorie malnutrition: Secondary | ICD-10-CM | POA: Diagnosis not present

## 2022-03-08 DIAGNOSIS — H159 Unspecified disorder of sclera: Secondary | ICD-10-CM | POA: Diagnosis not present

## 2022-03-08 DIAGNOSIS — R262 Difficulty in walking, not elsewhere classified: Secondary | ICD-10-CM | POA: Diagnosis not present

## 2022-03-08 DIAGNOSIS — R5381 Other malaise: Secondary | ICD-10-CM | POA: Diagnosis not present

## 2022-03-08 DIAGNOSIS — M6281 Muscle weakness (generalized): Secondary | ICD-10-CM | POA: Diagnosis not present

## 2022-03-08 DIAGNOSIS — F330 Major depressive disorder, recurrent, mild: Secondary | ICD-10-CM | POA: Diagnosis not present

## 2022-03-08 MED ORDER — HYDROCODONE-ACETAMINOPHEN 5-325 MG PO TABS: 1.0000 | ORAL_TABLET | Freq: Four times a day (QID) | ORAL | 0 refills | Status: DC | PRN

## 2022-03-08 MED ORDER — LORAZEPAM 0.5 MG PO TABS: 0.2500 mg | ORAL_TABLET | Freq: Every evening | ORAL | 0 refills | Status: DC | PRN

## 2022-03-08 MED ORDER — METHOCARBAMOL 500 MG PO TABS: 500.0000 mg | ORAL_TABLET | Freq: Four times a day (QID) | ORAL | 0 refills | Status: DC | PRN

## 2022-03-08 MED ORDER — ASPIRIN 81 MG PO CHEW: 81.0000 mg | CHEWABLE_TABLET | Freq: Two times a day (BID) | ORAL | 0 refills | Status: DC

## 2022-03-08 MED ORDER — DILTIAZEM HCL ER COATED BEADS 120 MG PO CP24: 120.0000 mg | ORAL_CAPSULE | Freq: Every day | ORAL | Status: DC

## 2022-03-08 MED ORDER — ENSURE SURGERY PO LIQD: 237.0000 mL | Freq: Two times a day (BID) | ORAL | 0 refills | Status: DC

## 2022-03-08 NOTE — Progress Notes (Signed)
Report to [place] has been call and given to [place]. Lisa Larson has been transported via ems without any complaints. Questions from the daughter, son and patient were answered at length to their satisfaction. Patient was educated on all of her medications. Vitals were also reported off to EMS for transport. Pt is stable at this time. ?

## 2022-03-08 NOTE — TOC Progression Note (Addendum)
Transition of Care (TOC) - Progression Note  ? ? ?Patient Details  ?Name: Lisa Larson ?MRN: [id number] ?Date of Birth: September 04, 1941 ? ?Transition of Care (TOC) CM/SW Contact  ?Ross Ludwig, LCSW ?Phone Number: ?03/08/2022, 12:26 PM ? ?Clinical Narrative:    ? ?CSW spoke to patient's daughter [name], and they have agreed to [facility].  CSW spoke to [place] at [facility] they can accept patient today.  CSW told patient's daughter if she is not happy with the way things are going at SNF to talk to administration and the social worker to get issues resolved.  CSW notified bedside nurse and attending physician. ? ? ?Expected Discharge Plan: [place] ?Barriers to Discharge: Continued Medical Work up ? ?Expected Discharge Plan and Services ?Expected Discharge Plan: [place] ?  ?Discharge Planning Services: CM Consult ?Post Acute Care Choice: [place] ?Living arrangements for the past 2 months: [place] ?Expected Discharge Date: 03/08/22               ?  ?  ?  ?  ?  ?  ?  ?  ?  ?  ? ? ?Social Determinants of Health (SDOH) Interventions ?  ? ?Readmission Risk Interventions ?No flowsheet data found. ? ?

## 2022-03-08 NOTE — Discharge Summary (Signed)
Physician Discharge Summary   Patient: Lisa Larson MRN: [id number] DOB: 02/11/1941  Admit date:     03/03/2022  Discharge date: 03/08/22  Discharge Physician: Alberteen Sam   PCP: Pincus Sanes, MD   Recommendations at discharge:  Follow up with Orthpedics, Dr. August Saucer in 2 weeks Follow up with Cardiology, Dr. Servando Salina in 2-4 weeks [facility]: Please check complete blood count to monitor WBC in 1 week      Discharge Diagnoses: Principal Problem:   Closed femur fracture (HCC) Active Problems:   Atrial fibrillation with RVR (HCC)   Anxiety state   Essential hypertension   Asthma, persistent controlled   Depression   Hypomagnesemia   Hallucinations        Hospital Course: Lisa Larson is a 81 y.o. F with HTN, anxiety who presented with fall.  She bent over to pick something from the ground and she lost balance and she landed on her right hip.   In the ER, X-ray showed right acute intertrochanteric femur fracture.  Patient is also found to have A-fib with RVR   3/6: Admitted on Cardizem gtt.  Orthopedics and cardio consulted.   3/7: ORIF by Dr. August Saucer 3/8: HR controlled with oral diltiazem      Assessment and Plan: * Closed femur fracture [facility]) Patient underwent uncomplicated ORIF of right hip on 3/7 by Dr. August Saucer.  Should leave dressing in place until Orthopedics follow up.  Had right leg swelling, US doppler ruled out RLE DVT.    Toe touch weight bearing only on right leg.  Aspirin for DVT prophylaxis, as below.      Atrial fibrillation with RVR (HCC) Noted to be in new onset. TSH normal.  Echo without valvular disease.   CHA2DS2-Vasc 4.  Cardiology recommended DOAC, patient has deferred for now.  Transitioned off Dilt gtt to PO, maintaining rate control  Patient should follow up with her primary cardiologist in 2-4 weeks.     Hallucinations I am unclear to what extent the patient has MCI or dementia, but in my interactions with her  she likely has 1 or the other, exacerbated at home possibly by Ativan, and here by anesthesia and analgesics.  CTH normal.  I see nothing other than expected sundowning in a post-op patient with MCI. 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    Consultants: Orthopedics, Cardiology Procedures performed: ORIF right hip, CT head, [place] right leg, echocardiogram   Disposition: Skilled nursing facility Diet recommendation:  Discharge Diet Orders (From admission, onward)     Start     Ordered   03/08/22 0000  Diet - low sodium heart healthy        03/08/22 0828            DISCHARGE MEDICATION: Allergies as of 03/08/2022       Reactions   Other Rash, Shortness Of Breath   Sulfonamide Derivatives Shortness Of Breath, Rash   Oxycodone-aspirin Other (See Comments)   Couldn't hear    Diclofenac Other (See Comments)   Unknown reaction  Other reaction(s): Trouble Breathing/Hives   Oxycodone    Other reaction(s): Trouble Breathing   Prolia [denosumab]    Muscle pain, bone pain   Rofecoxib Other (See Comments)   Unknown reaction  Other reaction(s): Hives/Trouble Breathing  Sulfa Antibiotics    Other reaction(s): Trouble Breathing   Ace Inhibitors Cough   Other reaction(s): Trouble Breathing/Hives Other reaction(s): Trouble Breathing/Hives   Benazepril Hcl Other (See Comments)   No PMH of angioedema; ACE-I caused cough   Tramadol Itching        Medication List     STOP taking these medications    acetaminophen 500 MG tablet Commonly known as: TYLENOL   buprenorphine 10 MCG/HR Ptwk Commonly known as: Butrans   losartan 100 MG tablet Commonly known as: COZAAR       TAKE these medications    albuterol 108 (90 Base) MCG/ACT inhaler Commonly known as: ProAir HFA Inhale 2  puffs into the lungs every 6 (six) hours as needed for wheezing or shortness of breath.   aspirin 81 MG chewable tablet Chew 1 tablet (81 mg total) by mouth 2 (two) times daily.   calcium carbonate 600 MG tablet Commonly known as: OS-CAL Take 600 mg by mouth 2 (two) times daily.   Cetirizine HCl 10 MG Caps Take 1 capsule (10 mg total) by mouth daily. What changed: when to take this   diltiazem 120 MG 24 hr capsule Commonly known as: CARDIZEM CD Take 1 capsule (120 mg total) by mouth daily. Start taking on: March 09, 2022   famotidine 40 MG tablet Commonly known as: PEPCID Take 1 tablet (40 mg total) by mouth daily as needed for heartburn or indigestion.   feeding supplement Liqd Take 237 mLs by mouth 2 (two) times daily between meals.   fluticasone 50 MCG/ACT nasal spray Commonly known as: FLONASE Place 2 sprays into both nostrils daily.   gabapentin 300 MG capsule Commonly known as: NEURONTIN TAKE 1 CAPSULE(300 MG) BY MOUTH THREE TIMES DAILY   HYDROcodone-acetaminophen 5-325 MG tablet Commonly known as: NORCO/VICODIN Take 1 tablet by mouth every 6 (six) hours as needed for moderate pain (pain score 4-6).   hydrOXYzine 25 MG tablet Commonly known as: ATARAX TAKE 1 TABLET(25 MG) BY MOUTH AT BEDTIME AS NEEDED FOR SLEEP   LORazepam 0.5 MG tablet Commonly known as: ATIVAN Take 0.5 tablets (0.25 mg total) by mouth at bedtime as needed for anxiety. TAKE 1/2 TABLET BY MOUTH EVERY 8 TO 12 HOURS AS NEEDED What changed:  medication strength how much to take how to take this when to take this reasons to take this   methocarbamol 500 MG tablet Commonly known as: ROBAXIN Take 1 tablet (500 mg total) by mouth every 6 (six) hours as needed for muscle spasms.   Nucala 100 MG/ML Soaj Generic drug: Mepolizumab Inject 1 mL (100 mg total) into the skin every 28 (twenty-eight) days. Deliver to patient's home for self-administration.   nystatin-triamcinolone ointment Commonly  known as: MYCOLOG Apply 1 application topically 2 (two) times daily.   predniSONE 5 MG tablet Commonly known as: DELTASONE Take 1 tablet (5 mg total) by mouth daily with breakfast. 1 tablet daily, patient has instructions to double up during sick days.   sertraline 100 MG tablet Commonly known as: ZOLOFT TAKE 2 TABLETS(200 MG) BY MOUTH DAILY   thiamine 100 MG tablet Commonly known as: Vitamin B-1 Take 100 mg by mouth daily as needed.   Vitamin D3 50 MCG (2000 UT) capsule Take 2,000 Units by mouth daily.   zinc gluconate 50 MG tablet Take 50 mg by mouth daily.        Follow-up Information     Tobb, Kardie, DO. Go to.   Specialty: Cardiology Why: as  directed Contact information: [address] [place] [address] [place] [address] [phone]         Cammy Copa, MD. Go in 2 week(s).   Specialty: Orthopedic Surgery Contact information: [address] [place] [address] [phone]                Discharge Instructions     Amb referral to [facility]   Complete by: As directed    Call MD / Call 911   Complete by: As directed    If you experience chest pain or shortness of breath, CALL 911 and be transported to the hospital emergency room.  If you develope a fever above 101 F, pus (white drainage) or increased drainage or redness at the wound, or calf pain, call your surgeon's office.   Constipation Prevention   Complete by: As directed    Drink plenty of fluids.  Prune juice may be helpful.  You may use a stool softener, such as Colace (over the counter) 100 mg twice a day.  Use MiraLax (over the counter) for constipation as needed.   Diet - low sodium heart healthy   Complete by: As directed    Discharge instructions   Complete by: As directed    Take the new heart rate slowing medicine diltiazem 120 mg daily Go see Dr. Servando Salina in 2-4 weeks  Partial weightbearing as tolerated right lower extremity for transfers only Okay to shower dressings  are waterproof Follow-up at 10 Ortho care [phone] in 7 days for clinical recheck   Increase activity slowly as tolerated   Complete by: As directed    Post-operative opioid taper instructions:   Complete by: As directed    POST-OPERATIVE OPIOID TAPER INSTRUCTIONS: It is important to wean off of your opioid medication as soon as possible. If you do not need pain medication after your surgery it is ok to stop day one. Opioids include: Codeine, Hydrocodone(Norco, Vicodin), Oxycodone(Percocet, oxycontin) and hydromorphone amongst others.  Long term and even short term use of opiods can cause: Increased pain response Dependence Constipation Depression Respiratory depression And more.  Withdrawal symptoms can include Flu like symptoms Nausea, vomiting And more Techniques to manage these symptoms Hydrate well Eat regular healthy meals Stay active Use relaxation techniques(deep breathing, meditating, yoga) Do Not substitute Alcohol to help with tapering If you have been on opioids for less than two weeks and do not have pain than it is ok to stop all together.  Plan to wean off of opioids This plan should start within one week post op of your joint replacement. Maintain the same interval or time between taking each dose and first decrease the dose.  Cut the total daily intake of opioids by one tablet each day Next start to increase the time between doses. The last dose that should be eliminated is the evening dose.          Discharge Exam: Filed Weights   03/04/22 1126 03/05/22 1600  Weight: 66.5 kg 69.1 kg   General: Pt is alert, awake, not in acute distress, lying in bed Cardiovascular: Irregularly irregular, nl S1-S2, no murmurs appreciated.   No LE edema.   Respiratory: Normal respiratory rate and rhythm.  CTAB without rales or wheezes. Abdominal: Abdomen soft and non-tender.  No distension or HSM.   Neuro/Psych: Strength symmetric in upper extremities.  Judgment and  insight appear mildly impaired.   Condition at discharge: fair  The results of significant diagnostics from this hospitalization (including imaging,  microbiology, ancillary and laboratory) are listed below for reference.   Imaging Studies: CT HEAD WO CONTRAST ( )  Result Date: 03/05/2022 CLINICAL DATA:  Altered mental status. EXAM: CT HEAD WITHOUT CONTRAST TECHNIQUE: Contiguous axial images were obtained from the base of the skull through the vertex without intravenous contrast. RADIATION DOSE REDUCTION: This exam was performed according to the departmental dose-optimization program which includes automated exposure control, adjustment of the mA and/or kV according to patient size and/or use of iterative reconstruction technique. COMPARISON:  None. FINDINGS: Brain: There is moderate severity cerebral atrophy with widening of the extra-axial spaces and ventricular dilatation. There are areas of decreased attenuation within the white matter tracts of the supratentorial brain, consistent with microvascular disease changes. Vascular: No hyperdense vessel or unexpected calcification. Skull: Normal. Negative for fracture or focal lesion. Sinuses/Orbits: Chronic bilateral maxillary sinus wall thickening is seen. Other: None. IMPRESSION: 1. No acute intracranial abnormality. 2. Moderate severity cerebral atrophy and microvascular disease changes of the supratentorial brain. Electronically Signed   By: Aram Candela M.D.   On: 03/05/2022 21:30   Pelvis Portable  Result Date: 03/04/2022 CLINICAL DATA:  Status post right intramedullary nail. EXAM: PORTABLE PELVIS 1-2 VIEWS COMPARISON:  Right hip x-ray 03/03/2022 FINDINGS: There is a new right femoral intramedullary nail and hip screw fixating an intratrochanteric fracture. Alignment is anatomic. No hardware loosening. There is lateral hip soft tissue swelling and air compatible with recent surgery. IMPRESSION: 1. New right femoral intramedullary nail and hip  screw in anatomic alignment. Electronically Signed   By: Darliss Cheney M.D.   On: 03/04/2022 22:48   DG C-Arm 1-60 Min-No Report  Result Date: 03/04/2022 Fluoroscopy was utilized by the requesting physician.  No radiographic interpretation.   ECHOCARDIOGRAM COMPLETE  Result Date: 03/04/2022    ECHOCARDIOGRAM REPORT   Patient Name:   POLLYANNA REHMERT Date of Exam: 03/04/2022 Medical Rec #:  [id number]           Height:       60.0 in Accession #:    [id number]          Weight:       146.6 lb Date of Birth:  Apr 23, 1941           BSA:          1.636 m Patient Age:    40 years            BP:           114/64 mmHg Patient Gender: F                   HR:           74 bpm. Exam Location:  Inpatient Procedure: 2D Echo, Cardiac Doppler and Color Doppler Indications:    R07.9 CHEST PAIN  History:        Patient has no prior history of Echocardiogram examinations.                 COPD; Signs/Symptoms:Murmur.  Sonographer:    Festus Barren Referring Phys: [id number] PAULA V ROSS IMPRESSIONS  1. Left ventricular ejection fraction, by estimation, is 60 to 65%. The left ventricle has normal function. The left ventricle has no regional wall motion abnormalities. Left ventricular diastolic function could not be evaluated.  2. Right ventricular systolic function is low normal. The right ventricular size is normal. There is normal pulmonary artery systolic pressure. The estimated right ventricular systolic pressure is 35.0 mmHg.  3. The mitral  valve is abnormal. Trivial mitral valve regurgitation. Moderate mitral annular calcification.  4. Tricuspid valve regurgitation is moderate.  5. The aortic valve is tricuspid. Aortic valve regurgitation is not visualized.  6. The inferior vena cava is normal in size with <50% respiratory variability, suggesting right atrial pressure of 8 mmHg. Comparison(s): No prior Echocardiogram. FINDINGS  Left Ventricle: Left ventricular ejection fraction, by estimation, is 60 to 65%. The left ventricle has  normal function. The left ventricle has no regional wall motion abnormalities. The left ventricular internal cavity size was normal in size. There is  no left ventricular hypertrophy. Left ventricular diastolic function could not be evaluated due to atrial fibrillation. Left ventricular diastolic function could not be evaluated. Right Ventricle: The right ventricular size is normal. No increase in right ventricular wall thickness. Right ventricular systolic function is low normal. There is normal pulmonary artery systolic pressure. The tricuspid regurgitant velocity is 2.60 m/s,  and with an assumed right atrial pressure of 8 mmHg, the estimated right ventricular systolic pressure is 35.0 mmHg. Left Atrium: Left atrial size was normal in size. Right Atrium: Right atrial size was normal in size. Pericardium: There is no evidence of pericardial effusion. Mitral Valve: The mitral valve is abnormal. Moderate mitral annular calcification. Trivial mitral valve regurgitation. Tricuspid Valve: The tricuspid valve is grossly normal. Tricuspid valve regurgitation is moderate. Aortic Valve: The aortic valve is tricuspid. Aortic valve regurgitation is not visualized. Aortic valve mean gradient measures 3.0 mmHg. Aortic valve peak gradient measures 4.8 mmHg. Aortic valve area, by VTI measures 1.16 cm. Pulmonic Valve: The pulmonic valve was normal in structure. Pulmonic valve regurgitation is not visualized. Aorta: The aortic root and ascending aorta are structurally normal, with no evidence of dilitation. Venous: The inferior vena cava is normal in size with less than 50% respiratory variability, suggesting right atrial pressure of 8 mmHg. IAS/Shunts: No atrial level shunt detected by color flow Doppler.  LEFT VENTRICLE PLAX 2D LVIDd:         3.10 cm LVIDs:         2.00 cm LV PW:         0.70 cm LV IVS:        0.70 cm LVOT diam:     1.50 cm LV SV:         24 LV SV Index:   15 LVOT Area:     1.77 cm  LV Volumes (MOD) LV vol d,  MOD A2C: 28.0 ml LV vol d, MOD A4C: 23.8 ml LV vol s, MOD A2C: 10.2 ml LV vol s, MOD A4C: 10.4 ml LV SV MOD A2C:     17.8 ml LV SV MOD A4C:     23.8 ml LV SV MOD BP:      15.7 ml RIGHT VENTRICLE            IVC RV S prime:     9.36 cm/s  IVC diam: 2.00 cm TAPSE (M-mode): 1.3 cm LEFT ATRIUM             Index        RIGHT ATRIUM           Index LA diam:        3.30 cm 2.02 cm/m   RA Area:     12.30 cm LA Vol (A2C):   33.4 ml 20.42 ml/m  RA Volume:   28.80 ml  17.61 ml/m LA Vol (A4C):   36.2 ml 22.13 ml/m LA Biplane Vol: 35.7 ml  21.82 ml/m  AORTIC VALVE                    PULMONIC VALVE AV Area (Vmax):    1.16 cm     PV Vmax:       0.51 m/s AV Area (Vmean):   1.15 cm     PV Vmean:      33.000 cm/s AV Area (VTI):     1.16 cm     PV VTI:        0.080 m AV Vmax:           110.00 cm/s  PV Peak grad:  1.0 mmHg AV Vmean:          73.700 cm/s  PV Mean grad:  1.0 mmHg AV VTI:            0.211 m AV Peak Grad:      4.8 mmHg AV Mean Grad:      3.0 mmHg LVOT Vmax:         72.50 cm/s LVOT Vmean:        47.800 cm/s LVOT VTI:          0.138 m LVOT/AV VTI ratio: 0.65  AORTA Ao Root diam: 2.30 cm Ao Asc diam:  2.40 cm TRICUSPID VALVE TR Peak grad:   27.0 mmHg TR Mean grad:   19.0 mmHg TR Vmax:        260.00 cm/s TR Vmean:       205.0 cm/s  SHUNTS Systemic VTI:  0.14 m Systemic Diam: 1.50 cm Zoila Shutter MD Electronically signed by Zoila Shutter MD Signature Date/Time: 03/04/2022/4:52:33 PM    Final    DG Hip Unilat  With Pelvis 2-3 Views Right  Result Date: 03/03/2022 CLINICAL DATA:  Right hip pain.  Fall. EXAM: DG HIP (WITH OR WITHOUT PELVIS) 2-3V RIGHT COMPARISON:  Hip radiographs 03/22/2019 FINDINGS: There is an acute minimally displaced intertrochanteric fracture of the right femur. Advanced right hip osteoarthrosis is noted with severe joint space narrowing, subchondral sclerosis, and marginal osteophytosis. Mild degenerative changes are noted at the left hip. IMPRESSION: Acute intertrochanteric right femur fracture.  Electronically Signed   By: Sebastian Ache M.D.   On: 03/03/2022 15:46   DG FEMUR, MIN 2 VIEWS RIGHT  Result Date: 03/05/2022 CLINICAL DATA:  Surgery EXAM: RIGHT FEMUR 2 VIEWS COMPARISON:  None. FINDINGS: Intraoperative images during right intertrochanteric fracture fixation with intramedullary nail. Improved alignment. No evidence of immediate complication. Moderate right hip osteoarthritis. IMPRESSION: Intraoperative images during right intertrochanteric fracture ORIF. No evidence of immediate complication. Electronically Signed   By: Caprice Renshaw M.D.   On: 03/05/2022 08:11   VAS [place] LOWER EXTREMITY VENOUS (DVT)  Result Date: 03/07/2022  Lower Venous DVT Study Patient Name:  Lisa Larson  Date of Exam:   03/07/2022 Medical Rec #: [id number]            Accession #:    [id number] Date of Birth: [birth year]            Patient Gender: F Patient Age:   45 years Exam Location:  [facility] Procedure:      VAS [place] LOWER EXTREMITY VENOUS (DVT) Referring Phys: Rise Paganini --------------------------------------------------------------------------------  Indications: Swelling.  Risk Factors: None identified Surgery. Limitations: Body habitus, poor ultrasound/tissue interface and patient pain tolerance. Comparison Study: No prior studies. Performing Technologist: Chanda Busing RVT  Examination Guidelines: A complete evaluation includes B-mode imaging, spectral Doppler, color Doppler, and power Doppler as  needed of all accessible portions of each vessel. Bilateral testing is considered an integral part of a complete examination. Limited examinations for reoccurring indications may be performed as noted. The reflux portion of the exam is performed with the patient in reverse Trendelenburg.  +---------+---------------+---------+-----------+----------+--------------+ RIGHT    CompressibilityPhasicitySpontaneityPropertiesThrombus Aging  +---------+---------------+---------+-----------+----------+--------------+ CFV      Full           Yes      Yes                                 +---------+---------------+---------+-----------+----------+--------------+ SFJ      Full                                                        +---------+---------------+---------+-----------+----------+--------------+ FV Prox  Full                                                        +---------+---------------+---------+-----------+----------+--------------+ FV Mid                  Yes      Yes                                 +---------+---------------+---------+-----------+----------+--------------+ FV Distal               Yes      Yes                                 +---------+---------------+---------+-----------+----------+--------------+ PFV      Full                                                        +---------+---------------+---------+-----------+----------+--------------+ POP      Full           Yes      Yes                                 +---------+---------------+---------+-----------+----------+--------------+ PTV      Full                                                        +---------+---------------+---------+-----------+----------+--------------+ PERO     Full                                                        +---------+---------------+---------+-----------+----------+--------------+   +---------+---------------+---------+-----------+----------+--------------+ LEFT  CompressibilityPhasicitySpontaneityPropertiesThrombus Aging +---------+---------------+---------+-----------+----------+--------------+ CFV      Full           Yes      Yes                                 +---------+---------------+---------+-----------+----------+--------------+ SFJ      Full                                                         +---------+---------------+---------+-----------+----------+--------------+ FV Prox  Full                                                        +---------+---------------+---------+-----------+----------+--------------+ FV Mid   Full                                                        +---------+---------------+---------+-----------+----------+--------------+ FV DistalFull                                                        +---------+---------------+---------+-----------+----------+--------------+ PFV      Full                                                        +---------+---------------+---------+-----------+----------+--------------+ POP      Full           Yes      Yes                                 +---------+---------------+---------+-----------+----------+--------------+ PTV      Full                                                        +---------+---------------+---------+-----------+----------+--------------+ PERO     Full                                                        +---------+---------------+---------+-----------+----------+--------------+     Summary: RIGHT: - There is no evidence of deep vein thrombosis in the lower extremity. However, portions of this examination were limited- see technologist comments above.  - No cystic structure found in the popliteal fossa.  LEFT: - There is no evidence of deep vein thrombosis in  the lower extremity.  - No cystic structure found in the popliteal fossa.  *See table(s) above for measurements and observations. Electronically signed by Sherald Hess MD on 03/07/2022 at 5:19:36 PM.    Final     Microbiology: Results for orders placed or performed during the hospital encounter of 03/03/22  Resp Panel by RT-PCR (Flu A&B, Covid) Nasopharyngeal Swab     Status: None   Collection Time: 03/03/22  4:11 PM   Specimen: Nasopharyngeal Swab; Nasopharyngeal(NP) swabs in vial transport medium  Result  Value Ref Range Status   SARS Coronavirus 2 by RT PCR NEGATIVE NEGATIVE Final    Comment: (NOTE) SARS-CoV-2 target nucleic acids are NOT DETECTED.  The SARS-CoV-2 RNA is generally detectable in upper respiratory specimens during the acute phase of infection. The lowest concentration of SARS-CoV-2 viral copies this assay can detect is 138 copies/mL. A negative result does not preclude SARS-Cov-2 infection and should not be used as the sole basis for treatment or other patient management decisions. A negative result may occur with  improper specimen collection/handling, submission of specimen other than nasopharyngeal swab, presence of viral mutation(s) within the areas targeted by this assay, and inadequate number of viral copies(<138 copies/mL). A negative result must be combined with clinical observations, patient history, and epidemiological information. The expected result is Negative.  Fact Sheet for Patients:  BloggerCourse.com  Fact Sheet for Healthcare Providers:  SeriousBroker.it  This test is no t yet approved or cleared by the [place] FDA and  has been authorized for detection and/or diagnosis of SARS-CoV-2 by FDA under an Emergency Use Authorization (EUA). This EUA will remain  in effect (meaning this test can be used) for the duration of the COVID-19 declaration under Section 564(b)(1) of the Act, 21 U.S.C.section 360bbb-3(b)(1), unless the authorization is terminated  or revoked sooner.       Influenza A by PCR NEGATIVE NEGATIVE Final   Influenza B by PCR NEGATIVE NEGATIVE Final    Comment: (NOTE) The Xpert Xpress SARS-CoV-2/FLU/RSV plus assay is intended as an aid in the diagnosis of influenza from Nasopharyngeal swab specimens and should not be used as a sole basis for treatment. Nasal washings and aspirates are unacceptable for Xpert Xpress SARS-CoV-2/FLU/RSV testing.  Fact Sheet for  Patients: BloggerCourse.com  Fact Sheet for Healthcare Providers: SeriousBroker.it  This test is not yet approved or cleared by the [place] FDA and has been authorized for detection and/or diagnosis of SARS-CoV-2 by FDA under an Emergency Use Authorization (EUA). This EUA will remain in effect (meaning this test can be used) for the duration of the COVID-19 declaration under Section 564(b)(1) of the Act, 21 U.S.C. section 360bbb-3(b)(1), unless the authorization is terminated or revoked.  Performed at [facility], [address] [place], [place] [address]   Surgical pcr screen     Status: None   Collection Time: 03/04/22  8:29 AM   Specimen: Nasal Mucosa; Nasal Swab  Result Value Ref Range Status   MRSA, PCR NEGATIVE NEGATIVE Final   Staphylococcus aureus NEGATIVE NEGATIVE Final    Comment: (NOTE) The Xpert SA Assay (FDA approved for NASAL specimens in patients 43 years of age and older), is one component of a comprehensive surveillance program. It is not intended to diagnose infection nor to guide or monitor treatment. Performed at [facility], [address] [place], [place] [address]    *Note: Due to a large number of results and/or encounters for the requested time period, some  results have not been displayed. A complete set of results can be found in Results Review.    Labs: CBC: Recent Labs  Lab 03/03/22 1611 03/04/22 0454 03/05/22 0459 03/06/22 0437  WBC 11.2* 9.0 9.4 13.0*  NEUTROABS 9.0*  --   --   --   HGB 13.4 11.5* 10.4* 10.2*  HCT 40.7 35.9* 34.0* 32.1*  MCV 99.3 100.8* 106.3* 101.9*  PLT 176 156 PLATELET CLUMPS NOTED ON SMEAR, UNABLE TO ESTIMATE 120*   Basic Metabolic Panel: Recent Labs  Lab 03/03/22 1611 03/04/22 0454 03/05/22 0459 03/06/22 0437  NA 141 138 134* 135  K 4.0 3.5 5.0 4.0  CL 106 104 103 104  CO2 26 25 23 24   GLUCOSE 127* 97 166*  147*  BUN 19 16 16 21   CREATININE 0.78 0.69 0.81 0.76  CALCIUM 9.5 9.0 8.7* 8.9  MG 1.9 1.6* 2.1  --   PHOS  --  3.2  --   --    Liver Function Tests: Recent Labs  Lab 03/03/22 1611  AST 32  ALT 21  ALKPHOS 46  BILITOT 0.4  PROT 6.9  ALBUMIN 4.2   CBG: Recent Labs  Lab 03/05/22 1559  GLUCAP 227*    Discharge time spent: 40 minutes.  Signed: Alberteen Sam, MD [facility] 03/08/2022

## 2022-03-08 NOTE — Plan of Care (Signed)
POC adequate for dc ?

## 2022-03-08 NOTE — TOC Transition Note (Addendum)
Transition of Care (TOC) - CM/SW Discharge Note ? ? ?Patient Details  ?Name: Lisa Larson ?MRN: [id number] ?Date of Birth: Oct 26, 1941 ? ?Transition of Care (TOC) CM/SW Contact:  ?Ross Ludwig, LCSW ?Phone Number: ?03/08/2022, 1:13 PM ? ? ?Clinical Narrative:    ? ?Passar number is [phone]. ? ?Patient to be d/c'ed today to [facility] room 117.  Patient and family agreeable to plans will transport via ems RN to call report to [phone].  Patient's daughter agreeable to [facility] and aware that patient is discharging today. ? ? ? ? ?Final next level of care: [place] ?Barriers to Discharge: Barriers Resolved ? ? ?Patient Goals and CMS Choice ?Patient states their goals for this hospitalization and ongoing recovery are:: To return back home after rehab ?CMS Medicare.gov Compare Post Acute Care list provided to:: Patient Represenative (must comment) ?Choice offered to / list presented to : Adult Children ? ?Discharge Placement ?PASRR number recieved: 03/07/22 ?           ?Patient chooses bed at: [facility] ?Patient to be transferred to facility by: [facility] ?Name of family member notified: Lisa Larson daughter [phone] ?Patient and family notified of of transfer: 03/08/22 ? ?Discharge Plan and Services ?  ?Discharge Planning Services: CM Consult ?Post Acute Care Choice: [place]          ?  ?  ?  ?  ?  ?  ?  ?  ?  ?  ? ?Social Determinants of Health (SDOH) Interventions ?  ? ? ?Readmission Risk Interventions ?No flowsheet data found. ? ? ? ? ?

## 2022-03-08 NOTE — Progress Notes (Signed)
Patient stable ?Pain controlled ?Ultrasound negative for DVT ?Aspirin okay for DVT prophylaxis from orthopedic standpoint ?Partial weightbearing for transfers right lower extremity ?Anticipate discharge to skilled nursing today ? ?

## 2022-03-10 DIAGNOSIS — J449 Chronic obstructive pulmonary disease, unspecified: Secondary | ICD-10-CM | POA: Diagnosis not present

## 2022-03-10 DIAGNOSIS — S72141D Displaced intertrochanteric fracture of right femur, subsequent encounter for closed fracture with routine healing: Secondary | ICD-10-CM | POA: Diagnosis not present

## 2022-03-10 DIAGNOSIS — M170 Bilateral primary osteoarthritis of knee: Secondary | ICD-10-CM | POA: Diagnosis not present

## 2022-03-10 DIAGNOSIS — I10 Essential (primary) hypertension: Secondary | ICD-10-CM | POA: Diagnosis not present

## 2022-03-10 DIAGNOSIS — Z4889 Encounter for other specified surgical aftercare: Secondary | ICD-10-CM | POA: Diagnosis not present

## 2022-03-10 DIAGNOSIS — F411 Generalized anxiety disorder: Secondary | ICD-10-CM | POA: Diagnosis not present

## 2022-03-10 DIAGNOSIS — K219 Gastro-esophageal reflux disease without esophagitis: Secondary | ICD-10-CM | POA: Diagnosis not present

## 2022-03-10 DIAGNOSIS — D649 Anemia, unspecified: Secondary | ICD-10-CM | POA: Diagnosis not present

## 2022-03-10 DIAGNOSIS — F32A Depression, unspecified: Secondary | ICD-10-CM | POA: Diagnosis not present

## 2022-03-10 DIAGNOSIS — G3184 Mild cognitive impairment, so stated: Secondary | ICD-10-CM | POA: Diagnosis not present

## 2022-03-10 DIAGNOSIS — G319 Degenerative disease of nervous system, unspecified: Secondary | ICD-10-CM | POA: Diagnosis not present

## 2022-03-10 DIAGNOSIS — M810 Age-related osteoporosis without current pathological fracture: Secondary | ICD-10-CM | POA: Diagnosis not present

## 2022-03-10 DIAGNOSIS — J8281 Chronic eosinophilic pneumonia: Secondary | ICD-10-CM | POA: Diagnosis not present

## 2022-03-10 DIAGNOSIS — J309 Allergic rhinitis, unspecified: Secondary | ICD-10-CM | POA: Diagnosis not present

## 2022-03-10 NOTE — Telephone Encounter (Signed)
I called on Saturday and talk to [place] and she went to [facility] that day

## 2022-03-12 ENCOUNTER — Encounter (HOSPITAL_COMMUNITY): Payer: Self-pay | Admitting: Orthopedic Surgery

## 2022-03-12 ENCOUNTER — Telehealth: Payer: Self-pay | Admitting: Orthopedic Surgery

## 2022-03-12 DIAGNOSIS — K219 Gastro-esophageal reflux disease without esophagitis: Secondary | ICD-10-CM | POA: Diagnosis not present

## 2022-03-12 DIAGNOSIS — G319 Degenerative disease of nervous system, unspecified: Secondary | ICD-10-CM | POA: Diagnosis not present

## 2022-03-12 DIAGNOSIS — J309 Allergic rhinitis, unspecified: Secondary | ICD-10-CM | POA: Diagnosis not present

## 2022-03-12 DIAGNOSIS — R5381 Other malaise: Secondary | ICD-10-CM | POA: Diagnosis not present

## 2022-03-12 DIAGNOSIS — M79604 Pain in right leg: Secondary | ICD-10-CM | POA: Diagnosis not present

## 2022-03-12 DIAGNOSIS — I10 Essential (primary) hypertension: Secondary | ICD-10-CM | POA: Diagnosis not present

## 2022-03-12 DIAGNOSIS — Z1159 Encounter for screening for other viral diseases: Secondary | ICD-10-CM | POA: Diagnosis not present

## 2022-03-12 DIAGNOSIS — D649 Anemia, unspecified: Secondary | ICD-10-CM | POA: Diagnosis not present

## 2022-03-12 DIAGNOSIS — F32A Depression, unspecified: Secondary | ICD-10-CM | POA: Diagnosis not present

## 2022-03-12 DIAGNOSIS — M170 Bilateral primary osteoarthritis of knee: Secondary | ICD-10-CM | POA: Diagnosis not present

## 2022-03-12 DIAGNOSIS — R262 Difficulty in walking, not elsewhere classified: Secondary | ICD-10-CM | POA: Diagnosis not present

## 2022-03-12 DIAGNOSIS — M6281 Muscle weakness (generalized): Secondary | ICD-10-CM | POA: Diagnosis not present

## 2022-03-12 DIAGNOSIS — M810 Age-related osteoporosis without current pathological fracture: Secondary | ICD-10-CM | POA: Diagnosis not present

## 2022-03-12 DIAGNOSIS — J8281 Chronic eosinophilic pneumonia: Secondary | ICD-10-CM | POA: Diagnosis not present

## 2022-03-12 DIAGNOSIS — J449 Chronic obstructive pulmonary disease, unspecified: Secondary | ICD-10-CM | POA: Diagnosis not present

## 2022-03-12 DIAGNOSIS — S72141D Displaced intertrochanteric fracture of right femur, subsequent encounter for closed fracture with routine healing: Secondary | ICD-10-CM | POA: Diagnosis not present

## 2022-03-12 NOTE — Telephone Encounter (Signed)
Lisa Larson (PT) from [facility] called requesting a call back concerning clarification for weight baring status for pt's physical therapy appt. Please call Lisa Larson back on his secure phone line at [phone]. ?

## 2022-03-13 DIAGNOSIS — F32A Depression, unspecified: Secondary | ICD-10-CM | POA: Diagnosis not present

## 2022-03-13 DIAGNOSIS — J449 Chronic obstructive pulmonary disease, unspecified: Secondary | ICD-10-CM | POA: Diagnosis not present

## 2022-03-13 DIAGNOSIS — M810 Age-related osteoporosis without current pathological fracture: Secondary | ICD-10-CM | POA: Diagnosis not present

## 2022-03-13 DIAGNOSIS — D649 Anemia, unspecified: Secondary | ICD-10-CM | POA: Diagnosis not present

## 2022-03-13 DIAGNOSIS — I10 Essential (primary) hypertension: Secondary | ICD-10-CM | POA: Diagnosis not present

## 2022-03-13 DIAGNOSIS — G319 Degenerative disease of nervous system, unspecified: Secondary | ICD-10-CM | POA: Diagnosis not present

## 2022-03-13 DIAGNOSIS — S72141D Displaced intertrochanteric fracture of right femur, subsequent encounter for closed fracture with routine healing: Secondary | ICD-10-CM | POA: Diagnosis not present

## 2022-03-13 DIAGNOSIS — J309 Allergic rhinitis, unspecified: Secondary | ICD-10-CM | POA: Diagnosis not present

## 2022-03-13 DIAGNOSIS — J8281 Chronic eosinophilic pneumonia: Secondary | ICD-10-CM | POA: Diagnosis not present

## 2022-03-13 DIAGNOSIS — M170 Bilateral primary osteoarthritis of knee: Secondary | ICD-10-CM | POA: Diagnosis not present

## 2022-03-13 DIAGNOSIS — K219 Gastro-esophageal reflux disease without esophagitis: Secondary | ICD-10-CM | POA: Diagnosis not present

## 2022-03-13 DIAGNOSIS — Z1159 Encounter for screening for other viral diseases: Secondary | ICD-10-CM | POA: Diagnosis not present

## 2022-03-14 NOTE — Telephone Encounter (Signed)
IC verbal given.  

## 2022-03-14 NOTE — Telephone Encounter (Signed)
Partial weightbearing for transfers only until first postop visit then we should be able to let her be weightbearing as tolerated

## 2022-03-17 DIAGNOSIS — M810 Age-related osteoporosis without current pathological fracture: Secondary | ICD-10-CM | POA: Diagnosis not present

## 2022-03-17 DIAGNOSIS — F411 Generalized anxiety disorder: Secondary | ICD-10-CM | POA: Diagnosis not present

## 2022-03-17 DIAGNOSIS — K219 Gastro-esophageal reflux disease without esophagitis: Secondary | ICD-10-CM | POA: Diagnosis not present

## 2022-03-17 DIAGNOSIS — J309 Allergic rhinitis, unspecified: Secondary | ICD-10-CM | POA: Diagnosis not present

## 2022-03-17 DIAGNOSIS — J449 Chronic obstructive pulmonary disease, unspecified: Secondary | ICD-10-CM | POA: Diagnosis not present

## 2022-03-17 DIAGNOSIS — M170 Bilateral primary osteoarthritis of knee: Secondary | ICD-10-CM | POA: Diagnosis not present

## 2022-03-17 DIAGNOSIS — G319 Degenerative disease of nervous system, unspecified: Secondary | ICD-10-CM | POA: Diagnosis not present

## 2022-03-17 DIAGNOSIS — S72141D Displaced intertrochanteric fracture of right femur, subsequent encounter for closed fracture with routine healing: Secondary | ICD-10-CM | POA: Diagnosis not present

## 2022-03-17 DIAGNOSIS — D649 Anemia, unspecified: Secondary | ICD-10-CM | POA: Diagnosis not present

## 2022-03-17 DIAGNOSIS — J8281 Chronic eosinophilic pneumonia: Secondary | ICD-10-CM | POA: Diagnosis not present

## 2022-03-17 DIAGNOSIS — I10 Essential (primary) hypertension: Secondary | ICD-10-CM | POA: Diagnosis not present

## 2022-03-17 DIAGNOSIS — F32A Depression, unspecified: Secondary | ICD-10-CM | POA: Diagnosis not present

## 2022-03-19 ENCOUNTER — Ambulatory Visit (INDEPENDENT_AMBULATORY_CARE_PROVIDER_SITE_OTHER): Payer: Medicare Other | Admitting: Orthopedic Surgery

## 2022-03-19 ENCOUNTER — Ambulatory Visit (INDEPENDENT_AMBULATORY_CARE_PROVIDER_SITE_OTHER): Payer: Medicare Other

## 2022-03-19 ENCOUNTER — Other Ambulatory Visit: Payer: Self-pay

## 2022-03-19 DIAGNOSIS — M79604 Pain in right leg: Secondary | ICD-10-CM

## 2022-03-19 DIAGNOSIS — J8281 Chronic eosinophilic pneumonia: Secondary | ICD-10-CM | POA: Diagnosis not present

## 2022-03-19 DIAGNOSIS — J309 Allergic rhinitis, unspecified: Secondary | ICD-10-CM | POA: Diagnosis not present

## 2022-03-19 DIAGNOSIS — F32A Depression, unspecified: Secondary | ICD-10-CM | POA: Diagnosis not present

## 2022-03-19 DIAGNOSIS — K219 Gastro-esophageal reflux disease without esophagitis: Secondary | ICD-10-CM | POA: Diagnosis not present

## 2022-03-19 DIAGNOSIS — H159 Unspecified disorder of sclera: Secondary | ICD-10-CM | POA: Diagnosis not present

## 2022-03-19 DIAGNOSIS — I10 Essential (primary) hypertension: Secondary | ICD-10-CM | POA: Diagnosis not present

## 2022-03-19 DIAGNOSIS — M170 Bilateral primary osteoarthritis of knee: Secondary | ICD-10-CM | POA: Diagnosis not present

## 2022-03-19 DIAGNOSIS — G319 Degenerative disease of nervous system, unspecified: Secondary | ICD-10-CM | POA: Diagnosis not present

## 2022-03-19 DIAGNOSIS — M810 Age-related osteoporosis without current pathological fracture: Secondary | ICD-10-CM | POA: Diagnosis not present

## 2022-03-19 DIAGNOSIS — F411 Generalized anxiety disorder: Secondary | ICD-10-CM | POA: Diagnosis not present

## 2022-03-19 DIAGNOSIS — S72141D Displaced intertrochanteric fracture of right femur, subsequent encounter for closed fracture with routine healing: Secondary | ICD-10-CM | POA: Diagnosis not present

## 2022-03-19 DIAGNOSIS — D649 Anemia, unspecified: Secondary | ICD-10-CM | POA: Diagnosis not present

## 2022-03-22 ENCOUNTER — Encounter: Payer: Self-pay | Admitting: Orthopedic Surgery
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
## 2022-03-25 ENCOUNTER — Ambulatory Visit: Payer: Medicare Other

## 2022-03-25 ENCOUNTER — Ambulatory Visit: Payer: Medicare Other | Admitting: Cardiology

## 2022-03-26 DIAGNOSIS — M6281 Muscle weakness (generalized): Secondary | ICD-10-CM | POA: Diagnosis not present

## 2022-03-26 DIAGNOSIS — R5381 Other malaise: Secondary | ICD-10-CM | POA: Diagnosis not present

## 2022-03-26 DIAGNOSIS — M79604 Pain in right leg: Secondary | ICD-10-CM | POA: Diagnosis not present

## 2022-03-26 DIAGNOSIS — R262 Difficulty in walking, not elsewhere classified: Secondary | ICD-10-CM | POA: Diagnosis not present

## 2022-03-27 ENCOUNTER — Telehealth: Payer: Self-pay | Admitting: Pulmonary Disease

## 2022-03-27 DIAGNOSIS — F32A Depression, unspecified: Secondary | ICD-10-CM | POA: Diagnosis not present

## 2022-03-27 DIAGNOSIS — D649 Anemia, unspecified: Secondary | ICD-10-CM | POA: Diagnosis not present

## 2022-03-27 DIAGNOSIS — M810 Age-related osteoporosis without current pathological fracture: Secondary | ICD-10-CM | POA: Diagnosis not present

## 2022-03-27 DIAGNOSIS — G319 Degenerative disease of nervous system, unspecified: Secondary | ICD-10-CM | POA: Diagnosis not present

## 2022-03-27 DIAGNOSIS — J449 Chronic obstructive pulmonary disease, unspecified: Secondary | ICD-10-CM | POA: Diagnosis not present

## 2022-03-27 DIAGNOSIS — I10 Essential (primary) hypertension: Secondary | ICD-10-CM | POA: Diagnosis not present

## 2022-03-27 DIAGNOSIS — S72141D Displaced intertrochanteric fracture of right femur, subsequent encounter for closed fracture with routine healing: Secondary | ICD-10-CM | POA: Diagnosis not present

## 2022-03-27 DIAGNOSIS — Z4889 Encounter for other specified surgical aftercare: Secondary | ICD-10-CM | POA: Diagnosis not present

## 2022-03-27 DIAGNOSIS — G3184 Mild cognitive impairment, so stated: Secondary | ICD-10-CM | POA: Diagnosis not present

## 2022-03-27 DIAGNOSIS — Z1159 Encounter for screening for other viral diseases: Secondary | ICD-10-CM | POA: Diagnosis not present

## 2022-03-27 DIAGNOSIS — K219 Gastro-esophageal reflux disease without esophagitis: Secondary | ICD-10-CM | POA: Diagnosis not present

## 2022-03-27 DIAGNOSIS — F411 Generalized anxiety disorder: Secondary | ICD-10-CM | POA: Diagnosis not present

## 2022-03-27 DIAGNOSIS — M170 Bilateral primary osteoarthritis of knee: Secondary | ICD-10-CM | POA: Diagnosis not present

## 2022-03-27 DIAGNOSIS — J8281 Chronic eosinophilic pneumonia: Secondary | ICD-10-CM | POA: Diagnosis not present

## 2022-03-27 DIAGNOSIS — J309 Allergic rhinitis, unspecified: Secondary | ICD-10-CM | POA: Diagnosis not present

## 2022-03-27 NOTE — Telephone Encounter (Signed)
Patient's daughter is calling because she broke her hip on 03/03/22, had surgery on 03/04/22. She was discharged to a facility. She has a rescue inhaler but she's not taking any other inhalers. She wantst to know if she is supposed to have anu other inhalers based on her health. She's not sure if she was using any inhaler other than the rescue before her fall. Please advise ?

## 2022-03-28 NOTE — Telephone Encounter (Signed)
Called and spoke to patient's daughter who states patient was not sure if she should be using a maintenance inhaler. Also scheduled 6 month office visit with Dr Halford Chessman on May 3rd 2023. Nothing further needed.  ?

## 2022-03-29 ENCOUNTER — Encounter: Payer: Self-pay | Admitting: Pulmonary Disease

## 2022-03-29 DIAGNOSIS — J4550 Severe persistent asthma, uncomplicated: Secondary | ICD-10-CM

## 2022-03-31 DIAGNOSIS — F330 Major depressive disorder, recurrent, mild: Secondary | ICD-10-CM | POA: Diagnosis not present

## 2022-03-31 DIAGNOSIS — F0390 Unspecified dementia without behavioral disturbance: Secondary | ICD-10-CM | POA: Diagnosis not present

## 2022-03-31 DIAGNOSIS — F419 Anxiety disorder, unspecified: Secondary | ICD-10-CM | POA: Diagnosis not present

## 2022-03-31 MED ORDER — ALBUTEROL SULFATE HFA 108 (90 BASE) MCG/ACT IN AERS: 2.0000 | INHALATION_SPRAY | Freq: Four times a day (QID) | RESPIRATORY_TRACT | 5 refills | Status: DC | PRN

## 2022-04-02 DIAGNOSIS — I10 Essential (primary) hypertension: Secondary | ICD-10-CM | POA: Diagnosis not present

## 2022-04-02 DIAGNOSIS — M6281 Muscle weakness (generalized): Secondary | ICD-10-CM | POA: Diagnosis not present

## 2022-04-02 DIAGNOSIS — M1611 Unilateral primary osteoarthritis, right hip: Secondary | ICD-10-CM | POA: Diagnosis not present

## 2022-04-02 DIAGNOSIS — R262 Difficulty in walking, not elsewhere classified: Secondary | ICD-10-CM | POA: Diagnosis not present

## 2022-04-02 DIAGNOSIS — J45998 Other asthma: Secondary | ICD-10-CM | POA: Diagnosis not present

## 2022-04-02 DIAGNOSIS — I4891 Unspecified atrial fibrillation: Secondary | ICD-10-CM | POA: Diagnosis not present

## 2022-04-02 DIAGNOSIS — M5136 Other intervertebral disc degeneration, lumbar region: Secondary | ICD-10-CM | POA: Diagnosis not present

## 2022-04-02 DIAGNOSIS — F411 Generalized anxiety disorder: Secondary | ICD-10-CM | POA: Diagnosis not present

## 2022-04-02 DIAGNOSIS — S72141D Displaced intertrochanteric fracture of right femur, subsequent encounter for closed fracture with routine healing: Secondary | ICD-10-CM | POA: Diagnosis not present

## 2022-04-02 DIAGNOSIS — G3184 Mild cognitive impairment, so stated: Secondary | ICD-10-CM | POA: Diagnosis not present

## 2022-04-02 DIAGNOSIS — R5381 Other malaise: Secondary | ICD-10-CM | POA: Diagnosis not present

## 2022-04-02 DIAGNOSIS — M79604 Pain in right leg: Secondary | ICD-10-CM | POA: Diagnosis not present

## 2022-04-02 DIAGNOSIS — R443 Hallucinations, unspecified: Secondary | ICD-10-CM | POA: Diagnosis not present

## 2022-04-02 DIAGNOSIS — M109 Gout, unspecified: Secondary | ICD-10-CM | POA: Diagnosis not present

## 2022-04-03 DIAGNOSIS — J8281 Chronic eosinophilic pneumonia: Secondary | ICD-10-CM | POA: Diagnosis not present

## 2022-04-03 DIAGNOSIS — J449 Chronic obstructive pulmonary disease, unspecified: Secondary | ICD-10-CM | POA: Diagnosis not present

## 2022-04-03 DIAGNOSIS — F411 Generalized anxiety disorder: Secondary | ICD-10-CM | POA: Diagnosis not present

## 2022-04-03 DIAGNOSIS — D649 Anemia, unspecified: Secondary | ICD-10-CM | POA: Diagnosis not present

## 2022-04-03 DIAGNOSIS — I10 Essential (primary) hypertension: Secondary | ICD-10-CM | POA: Diagnosis not present

## 2022-04-03 DIAGNOSIS — G319 Degenerative disease of nervous system, unspecified: Secondary | ICD-10-CM | POA: Diagnosis not present

## 2022-04-03 DIAGNOSIS — S72141D Displaced intertrochanteric fracture of right femur, subsequent encounter for closed fracture with routine healing: Secondary | ICD-10-CM | POA: Diagnosis not present

## 2022-04-03 DIAGNOSIS — K219 Gastro-esophageal reflux disease without esophagitis: Secondary | ICD-10-CM | POA: Diagnosis not present

## 2022-04-03 DIAGNOSIS — M810 Age-related osteoporosis without current pathological fracture: Secondary | ICD-10-CM | POA: Diagnosis not present

## 2022-04-03 DIAGNOSIS — M109 Gout, unspecified: Secondary | ICD-10-CM | POA: Diagnosis not present

## 2022-04-03 DIAGNOSIS — F32A Depression, unspecified: Secondary | ICD-10-CM | POA: Diagnosis not present

## 2022-04-03 DIAGNOSIS — J309 Allergic rhinitis, unspecified: Secondary | ICD-10-CM | POA: Diagnosis not present

## 2022-04-04 DIAGNOSIS — Z1159 Encounter for screening for other viral diseases: Secondary | ICD-10-CM | POA: Diagnosis not present

## 2022-04-04 DIAGNOSIS — F32A Depression, unspecified: Secondary | ICD-10-CM | POA: Diagnosis not present

## 2022-04-04 DIAGNOSIS — J8281 Chronic eosinophilic pneumonia: Secondary | ICD-10-CM | POA: Diagnosis not present

## 2022-04-04 DIAGNOSIS — J449 Chronic obstructive pulmonary disease, unspecified: Secondary | ICD-10-CM | POA: Diagnosis not present

## 2022-04-04 DIAGNOSIS — G319 Degenerative disease of nervous system, unspecified: Secondary | ICD-10-CM | POA: Diagnosis not present

## 2022-04-04 DIAGNOSIS — M170 Bilateral primary osteoarthritis of knee: Secondary | ICD-10-CM | POA: Diagnosis not present

## 2022-04-04 DIAGNOSIS — K219 Gastro-esophageal reflux disease without esophagitis: Secondary | ICD-10-CM | POA: Diagnosis not present

## 2022-04-04 DIAGNOSIS — D649 Anemia, unspecified: Secondary | ICD-10-CM | POA: Diagnosis not present

## 2022-04-04 DIAGNOSIS — M5136 Other intervertebral disc degeneration, lumbar region: Secondary | ICD-10-CM | POA: Diagnosis not present

## 2022-04-04 DIAGNOSIS — M810 Age-related osteoporosis without current pathological fracture: Secondary | ICD-10-CM | POA: Diagnosis not present

## 2022-04-04 DIAGNOSIS — I10 Essential (primary) hypertension: Secondary | ICD-10-CM | POA: Diagnosis not present

## 2022-04-04 DIAGNOSIS — S72141D Displaced intertrochanteric fracture of right femur, subsequent encounter for closed fracture with routine healing: Secondary | ICD-10-CM | POA: Diagnosis not present

## 2022-04-07 DIAGNOSIS — S72141D Displaced intertrochanteric fracture of right femur, subsequent encounter for closed fracture with routine healing: Secondary | ICD-10-CM | POA: Diagnosis not present

## 2022-04-07 DIAGNOSIS — D649 Anemia, unspecified: Secondary | ICD-10-CM | POA: Diagnosis not present

## 2022-04-07 DIAGNOSIS — M170 Bilateral primary osteoarthritis of knee: Secondary | ICD-10-CM | POA: Diagnosis not present

## 2022-04-07 DIAGNOSIS — J8281 Chronic eosinophilic pneumonia: Secondary | ICD-10-CM | POA: Diagnosis not present

## 2022-04-07 DIAGNOSIS — F32A Depression, unspecified: Secondary | ICD-10-CM | POA: Diagnosis not present

## 2022-04-07 DIAGNOSIS — J449 Chronic obstructive pulmonary disease, unspecified: Secondary | ICD-10-CM | POA: Diagnosis not present

## 2022-04-07 DIAGNOSIS — G319 Degenerative disease of nervous system, unspecified: Secondary | ICD-10-CM | POA: Diagnosis not present

## 2022-04-07 DIAGNOSIS — I10 Essential (primary) hypertension: Secondary | ICD-10-CM | POA: Diagnosis not present

## 2022-04-07 DIAGNOSIS — N390 Urinary tract infection, site not specified: Secondary | ICD-10-CM | POA: Diagnosis not present

## 2022-04-07 DIAGNOSIS — Z1159 Encounter for screening for other viral diseases: Secondary | ICD-10-CM | POA: Diagnosis not present

## 2022-04-07 DIAGNOSIS — J309 Allergic rhinitis, unspecified: Secondary | ICD-10-CM | POA: Diagnosis not present

## 2022-04-07 DIAGNOSIS — M810 Age-related osteoporosis without current pathological fracture: Secondary | ICD-10-CM | POA: Diagnosis not present

## 2022-04-08 DIAGNOSIS — J309 Allergic rhinitis, unspecified: Secondary | ICD-10-CM | POA: Diagnosis not present

## 2022-04-08 DIAGNOSIS — G319 Degenerative disease of nervous system, unspecified: Secondary | ICD-10-CM | POA: Diagnosis not present

## 2022-04-08 DIAGNOSIS — D649 Anemia, unspecified: Secondary | ICD-10-CM | POA: Diagnosis not present

## 2022-04-08 DIAGNOSIS — J8281 Chronic eosinophilic pneumonia: Secondary | ICD-10-CM | POA: Diagnosis not present

## 2022-04-08 DIAGNOSIS — S72141D Displaced intertrochanteric fracture of right femur, subsequent encounter for closed fracture with routine healing: Secondary | ICD-10-CM | POA: Diagnosis not present

## 2022-04-08 DIAGNOSIS — M810 Age-related osteoporosis without current pathological fracture: Secondary | ICD-10-CM | POA: Diagnosis not present

## 2022-04-08 DIAGNOSIS — J449 Chronic obstructive pulmonary disease, unspecified: Secondary | ICD-10-CM | POA: Diagnosis not present

## 2022-04-08 DIAGNOSIS — F32A Depression, unspecified: Secondary | ICD-10-CM | POA: Diagnosis not present

## 2022-04-08 DIAGNOSIS — F411 Generalized anxiety disorder: Secondary | ICD-10-CM | POA: Diagnosis not present

## 2022-04-08 DIAGNOSIS — N390 Urinary tract infection, site not specified: Secondary | ICD-10-CM | POA: Diagnosis not present

## 2022-04-08 DIAGNOSIS — Z1159 Encounter for screening for other viral diseases: Secondary | ICD-10-CM | POA: Diagnosis not present

## 2022-04-08 DIAGNOSIS — M170 Bilateral primary osteoarthritis of knee: Secondary | ICD-10-CM | POA: Diagnosis not present

## 2022-04-09 DIAGNOSIS — N390 Urinary tract infection, site not specified: Secondary | ICD-10-CM | POA: Diagnosis not present

## 2022-04-09 DIAGNOSIS — J449 Chronic obstructive pulmonary disease, unspecified: Secondary | ICD-10-CM | POA: Diagnosis not present

## 2022-04-09 DIAGNOSIS — M170 Bilateral primary osteoarthritis of knee: Secondary | ICD-10-CM | POA: Diagnosis not present

## 2022-04-09 DIAGNOSIS — J8281 Chronic eosinophilic pneumonia: Secondary | ICD-10-CM | POA: Diagnosis not present

## 2022-04-09 DIAGNOSIS — G319 Degenerative disease of nervous system, unspecified: Secondary | ICD-10-CM | POA: Diagnosis not present

## 2022-04-09 DIAGNOSIS — F32A Depression, unspecified: Secondary | ICD-10-CM | POA: Diagnosis not present

## 2022-04-09 DIAGNOSIS — S72141D Displaced intertrochanteric fracture of right femur, subsequent encounter for closed fracture with routine healing: Secondary | ICD-10-CM | POA: Diagnosis not present

## 2022-04-09 DIAGNOSIS — M810 Age-related osteoporosis without current pathological fracture: Secondary | ICD-10-CM | POA: Diagnosis not present

## 2022-04-09 DIAGNOSIS — I10 Essential (primary) hypertension: Secondary | ICD-10-CM | POA: Diagnosis not present

## 2022-04-09 DIAGNOSIS — Z1159 Encounter for screening for other viral diseases: Secondary | ICD-10-CM | POA: Diagnosis not present

## 2022-04-09 DIAGNOSIS — J309 Allergic rhinitis, unspecified: Secondary | ICD-10-CM | POA: Diagnosis not present

## 2022-04-09 DIAGNOSIS — D649 Anemia, unspecified: Secondary | ICD-10-CM | POA: Diagnosis not present

## 2022-04-10 DIAGNOSIS — F411 Generalized anxiety disorder: Secondary | ICD-10-CM | POA: Diagnosis not present

## 2022-04-10 DIAGNOSIS — J309 Allergic rhinitis, unspecified: Secondary | ICD-10-CM | POA: Diagnosis not present

## 2022-04-10 DIAGNOSIS — M810 Age-related osteoporosis without current pathological fracture: Secondary | ICD-10-CM | POA: Diagnosis not present

## 2022-04-10 DIAGNOSIS — J8281 Chronic eosinophilic pneumonia: Secondary | ICD-10-CM | POA: Diagnosis not present

## 2022-04-10 DIAGNOSIS — K219 Gastro-esophageal reflux disease without esophagitis: Secondary | ICD-10-CM | POA: Diagnosis not present

## 2022-04-10 DIAGNOSIS — F32A Depression, unspecified: Secondary | ICD-10-CM | POA: Diagnosis not present

## 2022-04-10 DIAGNOSIS — S72141D Displaced intertrochanteric fracture of right femur, subsequent encounter for closed fracture with routine healing: Secondary | ICD-10-CM | POA: Diagnosis not present

## 2022-04-10 DIAGNOSIS — G319 Degenerative disease of nervous system, unspecified: Secondary | ICD-10-CM | POA: Diagnosis not present

## 2022-04-10 DIAGNOSIS — D649 Anemia, unspecified: Secondary | ICD-10-CM | POA: Diagnosis not present

## 2022-04-10 DIAGNOSIS — I10 Essential (primary) hypertension: Secondary | ICD-10-CM | POA: Diagnosis not present

## 2022-04-10 DIAGNOSIS — J449 Chronic obstructive pulmonary disease, unspecified: Secondary | ICD-10-CM | POA: Diagnosis not present

## 2022-04-10 DIAGNOSIS — M170 Bilateral primary osteoarthritis of knee: Secondary | ICD-10-CM | POA: Diagnosis not present

## 2022-04-11 ENCOUNTER — Ambulatory Visit: Payer: Medicare Other | Admitting: Cardiology

## 2022-04-11 ENCOUNTER — Ambulatory Visit: Payer: Medicare Other | Admitting: Orthopedic Surgery

## 2022-04-11 DIAGNOSIS — N390 Urinary tract infection, site not specified: Secondary | ICD-10-CM | POA: Diagnosis not present

## 2022-04-11 DIAGNOSIS — M79604 Pain in right leg: Secondary | ICD-10-CM | POA: Diagnosis not present

## 2022-04-11 DIAGNOSIS — J45998 Other asthma: Secondary | ICD-10-CM | POA: Diagnosis not present

## 2022-04-11 DIAGNOSIS — G3184 Mild cognitive impairment, so stated: Secondary | ICD-10-CM | POA: Diagnosis not present

## 2022-04-11 DIAGNOSIS — S72141D Displaced intertrochanteric fracture of right femur, subsequent encounter for closed fracture with routine healing: Secondary | ICD-10-CM | POA: Diagnosis not present

## 2022-04-11 DIAGNOSIS — M5136 Other intervertebral disc degeneration, lumbar region: Secondary | ICD-10-CM | POA: Diagnosis not present

## 2022-04-11 DIAGNOSIS — H159 Unspecified disorder of sclera: Secondary | ICD-10-CM | POA: Diagnosis not present

## 2022-04-11 DIAGNOSIS — E46 Unspecified protein-calorie malnutrition: Secondary | ICD-10-CM | POA: Diagnosis not present

## 2022-04-11 DIAGNOSIS — M6281 Muscle weakness (generalized): Secondary | ICD-10-CM | POA: Diagnosis not present

## 2022-04-11 DIAGNOSIS — J8281 Chronic eosinophilic pneumonia: Secondary | ICD-10-CM | POA: Diagnosis not present

## 2022-04-11 DIAGNOSIS — M1611 Unilateral primary osteoarthritis, right hip: Secondary | ICD-10-CM | POA: Diagnosis not present

## 2022-04-11 DIAGNOSIS — R5381 Other malaise: Secondary | ICD-10-CM | POA: Diagnosis not present

## 2022-04-11 DIAGNOSIS — I4891 Unspecified atrial fibrillation: Secondary | ICD-10-CM | POA: Diagnosis not present

## 2022-04-11 DIAGNOSIS — R262 Difficulty in walking, not elsewhere classified: Secondary | ICD-10-CM | POA: Diagnosis not present

## 2022-04-21 ENCOUNTER — Encounter: Payer: Self-pay | Admitting: Orthopedic Surgery

## 2022-04-22 ENCOUNTER — Ambulatory Visit: Payer: Medicare Other | Admitting: Surgical

## 2022-04-22 DIAGNOSIS — G319 Degenerative disease of nervous system, unspecified: Secondary | ICD-10-CM | POA: Diagnosis not present

## 2022-04-22 DIAGNOSIS — S72141D Displaced intertrochanteric fracture of right femur, subsequent encounter for closed fracture with routine healing: Secondary | ICD-10-CM | POA: Diagnosis not present

## 2022-04-22 DIAGNOSIS — M810 Age-related osteoporosis without current pathological fracture: Secondary | ICD-10-CM | POA: Diagnosis not present

## 2022-04-22 DIAGNOSIS — J449 Chronic obstructive pulmonary disease, unspecified: Secondary | ICD-10-CM | POA: Diagnosis not present

## 2022-04-22 DIAGNOSIS — D649 Anemia, unspecified: Secondary | ICD-10-CM | POA: Diagnosis not present

## 2022-04-22 DIAGNOSIS — Z1159 Encounter for screening for other viral diseases: Secondary | ICD-10-CM | POA: Diagnosis not present

## 2022-04-22 DIAGNOSIS — F32A Depression, unspecified: Secondary | ICD-10-CM | POA: Diagnosis not present

## 2022-04-22 DIAGNOSIS — I10 Essential (primary) hypertension: Secondary | ICD-10-CM | POA: Diagnosis not present

## 2022-04-22 DIAGNOSIS — N390 Urinary tract infection, site not specified: Secondary | ICD-10-CM | POA: Diagnosis not present

## 2022-04-22 DIAGNOSIS — J309 Allergic rhinitis, unspecified: Secondary | ICD-10-CM | POA: Diagnosis not present

## 2022-04-22 DIAGNOSIS — J8281 Chronic eosinophilic pneumonia: Secondary | ICD-10-CM | POA: Diagnosis not present

## 2022-04-22 DIAGNOSIS — M170 Bilateral primary osteoarthritis of knee: Secondary | ICD-10-CM | POA: Diagnosis not present

## 2022-04-29 DIAGNOSIS — F0390 Unspecified dementia without behavioral disturbance: Secondary | ICD-10-CM | POA: Diagnosis not present

## 2022-04-29 DIAGNOSIS — F5105 Insomnia due to other mental disorder: Secondary | ICD-10-CM | POA: Diagnosis not present

## 2022-04-29 DIAGNOSIS — F330 Major depressive disorder, recurrent, mild: Secondary | ICD-10-CM | POA: Diagnosis not present

## 2022-04-29 DIAGNOSIS — F411 Generalized anxiety disorder: Secondary | ICD-10-CM | POA: Diagnosis not present

## 2022-04-30 ENCOUNTER — Ambulatory Visit (INDEPENDENT_AMBULATORY_CARE_PROVIDER_SITE_OTHER): Payer: Medicare Other | Admitting: Pulmonary Disease

## 2022-04-30 ENCOUNTER — Encounter: Payer: Self-pay | Admitting: Pulmonary Disease

## 2022-04-30 VITALS — BP 102/64 | HR 79 | Temp 98.3°F | Ht 60.0 in | Wt 147.0 lb

## 2022-04-30 DIAGNOSIS — S72141D Displaced intertrochanteric fracture of right femur, subsequent encounter for closed fracture with routine healing: Secondary | ICD-10-CM | POA: Diagnosis not present

## 2022-04-30 DIAGNOSIS — M810 Age-related osteoporosis without current pathological fracture: Secondary | ICD-10-CM | POA: Diagnosis not present

## 2022-04-30 DIAGNOSIS — J8281 Chronic eosinophilic pneumonia: Secondary | ICD-10-CM | POA: Diagnosis not present

## 2022-04-30 DIAGNOSIS — Z1159 Encounter for screening for other viral diseases: Secondary | ICD-10-CM | POA: Diagnosis not present

## 2022-04-30 DIAGNOSIS — J4550 Severe persistent asthma, uncomplicated: Secondary | ICD-10-CM | POA: Diagnosis not present

## 2022-04-30 DIAGNOSIS — K219 Gastro-esophageal reflux disease without esophagitis: Secondary | ICD-10-CM | POA: Diagnosis not present

## 2022-04-30 DIAGNOSIS — J309 Allergic rhinitis, unspecified: Secondary | ICD-10-CM | POA: Diagnosis not present

## 2022-04-30 DIAGNOSIS — D649 Anemia, unspecified: Secondary | ICD-10-CM | POA: Diagnosis not present

## 2022-04-30 DIAGNOSIS — J449 Chronic obstructive pulmonary disease, unspecified: Secondary | ICD-10-CM | POA: Diagnosis not present

## 2022-04-30 DIAGNOSIS — I10 Essential (primary) hypertension: Secondary | ICD-10-CM | POA: Diagnosis not present

## 2022-04-30 DIAGNOSIS — M170 Bilateral primary osteoarthritis of knee: Secondary | ICD-10-CM | POA: Diagnosis not present

## 2022-04-30 DIAGNOSIS — G319 Degenerative disease of nervous system, unspecified: Secondary | ICD-10-CM | POA: Diagnosis not present

## 2022-04-30 DIAGNOSIS — F32A Depression, unspecified: Secondary | ICD-10-CM | POA: Diagnosis not present

## 2022-04-30 MED ORDER — ALBUTEROL SULFATE HFA 108 (90 BASE) MCG/ACT IN AERS: 2.0000 | INHALATION_SPRAY | Freq: Four times a day (QID) | RESPIRATORY_TRACT | 5 refills | Status: DC | PRN

## 2022-04-30 NOTE — Patient Instructions (Signed)
Follow up in 6 months 

## 2022-04-30 NOTE — Progress Notes (Signed)
? ?[facility] ? ?Chief Complaint  ?Patient presents with  ? Follow-up  ?  Follow up.   ? ? ?Constitutional:  ?BP 102/64 (BP Location: Right Arm, Patient Position: Sitting, Cuff Size: Normal)   Pulse 79   Temp 98.3 ?F (36.8 ?C) (Oral)   Ht 5' (1.524 m)   Wt 147 lb (66.7 kg)   SpO2 92%   BMI 28.71 kg/m?  ? ?Past Medical History:  ?Anemia, Anxiety, Arthritis, C2 fx, Cataract, Chronic headaches, Depression, GERD, Gout, Hiatal hernia, HTN, Osteoporosis, Urinary incontinence ? ?Past Surgical History:  ?She  has a past surgical history that includes Shoulder surgery (Left, 08-2008); Total abdominal hysterectomy; Tonsillectomy and adenoidectomy; colonoscopy with polypectomy (06/2013); Nasal sinus surgery; Appendectomy; Esophageal dilation; Bronchoscopy (12-2010); Cataract extraction w/ intraocular lens  implant, bilateral (Bilateral); Knee arthroscopy (Right, 06/18/2015); Tonsillectomy; Cholecystectomy (N/A, 05/23/2016); Upper gastrointestinal endoscopy; Colonoscopy; Polypectomy; Adenoidectomy; and Intramedullary (im) nail intertrochanteric (Right, 03/04/2022). ? ?Brief Summary:  ?Lisa Larson is a 81 y.o. 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 ?  ? ? ? ?Subjective:  ? ?She was in hospital in March for femur fracture.  Currently residing at [facility] [place].  CBC from 03/03/22 showed 0 eosinophils. ? ?Breathing good.  Not having cough, wheeze, or chest congestion.  Sinuses okay.  Not having skin rash.  Reports she had transient A fib when in hospital.   ? ?Physical Exam:  ? ?Appearance - well kempt  ? ?ENMT - no sinus tenderness, no oral exudate, no LAN, Mallampati 2 airway, no stridor ? ?Respiratory - equal breath sounds bilaterally, no wheezing or rales ? ?CV - s1s2 regular rate and rhythm, no murmurs ? ?Ext - no clubbing, no edema ? ?Skin - no rashes ? ?Psych - normal mood and affect ? ?   ?Pulmonary testing:  ?CBC 01/14/11>>39% eosinophils ?01/14/11>> HIV negative, ACE 39, RF negative, ANA 1:40, ANCA negative   ?BAL 01/21/11>>48% Eosinophils   ?IgE 02/25/11>>282 ?PFT 11/04/12>>FEV1 1.77 (87%), FEV1% 68, TLC 4.87 (101%), DLCO 69%, +BD from FEF 25-75%. ?RAST 10/26/17 >> negative, IgE 108 ?Spirometry 08/09/19 >> FEV1 1.08 (64%), FEV1% 60 ? ?Chest Imaging:  ?CT chest 01/16/11 >> Multifocal b/l ASD with GGO ?CT chest 07/22/11 >> resolution of ASD ?CT chest 10/03/11 >> recurrence of nodular infiltrate Rt upper lobe ? ?Social History:  ?She  reports that she quit smoking about 53 years ago. Her smoking use included cigarettes. She has a 15.00 pack-year smoking history. She has never used smokeless tobacco. She reports that she does not drink alcohol and does not use drugs. ? ?Family History:  ?Her family history includes Alzheimer's disease in her mother; Arthritis in her father; Cancer in her son; Colitis in her mother; Diabetes in her brother and paternal grandmother; Heart attack in her paternal grandmother; Hypertension in her brother, father, and mother; Irritable bowel syndrome in her mother; Non-Hodgkin's lymphoma in her brother; Other in her son; Pulmonary embolism in her father; Rheum arthritis in her father. ?  ? ? ?Assessment/Plan:  ? ?Eosinophilic pneumonia. ?Allergic asthma with eosinophilic phenotype. ?- has been on nucala since March 2019 ?- continue nucala ?- prn albuterol ?- continue nucala ?  ?Allergic rhinitis. ?- continue nucala ?- prn flonase ? ?Osteoarthritis. ?- f/u with PCP and rheumatology (Dr. Dossie Der) ?  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 ?- followed by Dr. Kelton Pillar with endocrinology ?- maintained on 5 mg prednisone daily; likely helping with  asthma ? ? ?Time Spent Involved in Patient Care on Day of Examination:  ?26 minutes ? ?Follow up:  ? ?Patient Instructions  ?Follow up in 6 months ? ?Medication List:  ? ?Allergies as of 04/30/2022   ? ?   Reactions  ? Other  Rash, Shortness Of Breath  ? Sulfonamide Derivatives Shortness Of Breath, Rash  ? Oxycodone-aspirin Other (See Comments)  ? Couldn't hear   ? Diclofenac Other (See Comments)  ? Unknown reaction  ?Other reaction(s): Trouble Breathing/Hives  ? Oxycodone   ? Other reaction(s): Trouble Breathing  ? Prolia [denosumab]   ? Muscle pain, bone pain  ? Rofecoxib Other (See Comments)  ? Unknown reaction  ?Other reaction(s): Hives/Trouble Breathing  ? Sulfa Antibiotics   ? Other reaction(s): Trouble Breathing  ? Ace Inhibitors Cough  ? Other reaction(s): Trouble Breathing/Hives ?Other reaction(s): Trouble Breathing/Hives  ? Benazepril Hcl Other (See Comments)  ? No PMH of angioedema; ACE-I caused cough  ? Tramadol Itching  ? ?  ? ?  ?Medication List  ?  ? ?  ? Accurate as of Apr 30, 2022  1:51 PM. If you have any questions, ask your nurse or doctor.  ?  ?  ? ?  ? ?STOP taking these medications   ? ?methocarbamol 500 MG tablet ?Commonly known as: ROBAXIN ?Stopped by: [name], MD ?  ? ?  ? ?TAKE these medications   ? ?albuterol 108 (90 Base) MCG/ACT inhaler ?Commonly known as: ProAir HFA ?Inhale 2 puffs into the lungs every 6 (six) hours as needed for wheezing or shortness of breath. ?  ?aspirin 81 MG chewable tablet ?Chew 1 tablet (81 mg total) by mouth 2 (two) times daily. ?  ?calcium carbonate 600 MG tablet ?Commonly known as: OS-CAL ?Take 600 mg by mouth 2 (two) times daily. ?  ?Cetirizine HCl 10 MG Caps ?Take 1 capsule (10 mg total) by mouth daily. ?What changed: when to take this ?  ?diltiazem 120 MG 24 hr capsule ?Commonly known as: CARDIZEM CD ?Take 1 capsule (120 mg total) by mouth daily. ?  ?feeding supplement Liqd ?Take 237 mLs by mouth 2 (two) times daily between meals. ?  ?fluticasone 50 MCG/ACT nasal spray ?Commonly known as: FLONASE ?Place 2 sprays into both nostrils daily. ?  ?gabapentin 300 MG capsule ?Commonly known as: NEURONTIN ?TAKE 1 CAPSULE(300 MG) BY MOUTH THREE TIMES DAILY ?  ?HYDROcodone-acetaminophen  5-325 MG tablet ?Commonly known as: NORCO/VICODIN ?Take 1 tablet by mouth every 6 (six) hours as needed for moderate pain (pain score 4-6). ?  ?LORazepam 0.5 MG tablet ?Commonly known as: ATIVAN ?Take 0.5 tablets (0.25 mg total) by mouth at bedtime as needed for anxiety. TAKE 1/2 TABLET BY MOUTH EVERY 8 TO 12 HOURS AS NEEDED ?  ?Nucala 100 MG/ML Soaj ?Generic drug: Mepolizumab ?Inject 1 mL (100 mg total) into the skin every 28 (twenty-eight) days. Deliver to patient's home for self-administration. ?  ?predniSONE 5 MG tablet ?Commonly known as: DELTASONE ?Take 1 tablet (5 mg total) by mouth daily with breakfast. 1 tablet daily, patient has instructions to double up during sick days. ?  ?sertraline 100 MG tablet ?Commonly known as: ZOLOFT ?TAKE 2 TABLETS(200 MG) BY MOUTH DAILY ?  ?thiamine 100 MG tablet ?Commonly known as: Vitamin B-1 ?Take 100 mg by mouth daily as needed. ?  ?Vitamin D3 50 MCG (2000 UT) capsule ?Take 2,000 Units by mouth daily. ?  ?zinc gluconate 50 MG tablet ?Take 50 mg by mouth daily. ?  ? ?  ? ? ?  Signature:  ?[name], MD ?[facility] ?Pager - [phone] ?04/30/2022, 1:51 PM ?  ? ? ? ? ? ? ? ? ?

## 2022-05-02 ENCOUNTER — Ambulatory Visit (INDEPENDENT_AMBULATORY_CARE_PROVIDER_SITE_OTHER): Payer: Medicare Other

## 2022-05-02 ENCOUNTER — Ambulatory Visit (HOSPITAL_COMMUNITY)
Admission: RE | Admit: 2022-05-02 | Discharge: 2022-05-02 | Disposition: A | Discharge status: Home / Self Care | Payer: Medicare Other | Source: Ambulatory Visit | Attending: Surgical | Admitting: Surgical

## 2022-05-02 ENCOUNTER — Ambulatory Visit (INDEPENDENT_AMBULATORY_CARE_PROVIDER_SITE_OTHER): Payer: Medicare Other | Admitting: Surgical

## 2022-05-02 DIAGNOSIS — J302 Other seasonal allergic rhinitis: Secondary | ICD-10-CM | POA: Diagnosis not present

## 2022-05-02 DIAGNOSIS — M1711 Unilateral primary osteoarthritis, right knee: Secondary | ICD-10-CM

## 2022-05-02 DIAGNOSIS — M79604 Pain in right leg: Secondary | ICD-10-CM

## 2022-05-02 DIAGNOSIS — L304 Erythema intertrigo: Secondary | ICD-10-CM | POA: Diagnosis not present

## 2022-05-02 DIAGNOSIS — K5900 Constipation, unspecified: Secondary | ICD-10-CM | POA: Diagnosis not present

## 2022-05-02 NOTE — Progress Notes (Signed)
I called to let them know that she was negative for DVT.  Spoke with her daughter.  She does not have a current appointment to come back.  Can you schedule appointment for June 14 at 1 PM?  Thank you

## 2022-05-04 ENCOUNTER — Encounter: Payer: Self-pay | Admitting: Orthopedic Surgery

## 2022-05-04 MED ORDER — METHYLPREDNISOLONE ACETATE 40 MG/ML IJ SUSP
40.0000 mg | INTRAMUSCULAR | Status: AC | PRN
  Administered 2022-05-02: 40 mg via INTRA_ARTICULAR

## 2022-05-04 MED ORDER — LIDOCAINE HCL 1 % IJ SOLN
5.0000 mL | INTRAMUSCULAR | Status: AC | PRN
  Administered 2022-05-02: 5 mL

## 2022-05-04 MED ORDER — BUPIVACAINE HCL 0.25 % IJ SOLN
4.0000 mL | INTRAMUSCULAR | Status: AC | PRN
  Administered 2022-05-02: 4 mL via INTRA_ARTICULAR
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
## 2022-05-04 NOTE — Progress Notes (Signed)
? ?  Procedure Note ? ?Patient: Lisa Larson             ?Date of Birth: 07/16/1941           ?MRN: [id number]             ?Visit Date: 05/02/2022 ? ?Procedures: ?Visit Diagnoses:  ?1. Pain in right leg   ? ? ?Large Joint Inj: R knee on 05/02/2022 11:55 AM ?Indications: diagnostic evaluation, joint swelling and pain ?Details: 18 G 1.5 in needle, superolateral approach ? ?Arthrogram: No ? ?Medications: 5 mL lidocaine 1 %; 40 mg methylPREDNISolone acetate 40 MG/ML; 4 mL bupivacaine 0.25 % ?Outcome: tolerated well, no immediate complications ?Procedure, treatment alternatives, risks and benefits explained, specific risks discussed. Consent was given by the patient. Immediately prior to procedure a time out was called to verify the correct patient, procedure, equipment, support staff and site/side marked as required. Patient was prepped and draped in the usual sterile fashion.  ? ? ? ? ? ?

## 2022-05-07 DIAGNOSIS — M170 Bilateral primary osteoarthritis of knee: Secondary | ICD-10-CM | POA: Diagnosis not present

## 2022-05-07 DIAGNOSIS — M810 Age-related osteoporosis without current pathological fracture: Secondary | ICD-10-CM | POA: Diagnosis not present

## 2022-05-07 DIAGNOSIS — I10 Essential (primary) hypertension: Secondary | ICD-10-CM | POA: Diagnosis not present

## 2022-05-07 DIAGNOSIS — S72141D Displaced intertrochanteric fracture of right femur, subsequent encounter for closed fracture with routine healing: Secondary | ICD-10-CM | POA: Diagnosis not present

## 2022-05-07 DIAGNOSIS — Z1159 Encounter for screening for other viral diseases: Secondary | ICD-10-CM | POA: Diagnosis not present

## 2022-05-07 DIAGNOSIS — J309 Allergic rhinitis, unspecified: Secondary | ICD-10-CM | POA: Diagnosis not present

## 2022-05-07 DIAGNOSIS — D649 Anemia, unspecified: Secondary | ICD-10-CM | POA: Diagnosis not present

## 2022-05-07 DIAGNOSIS — J8281 Chronic eosinophilic pneumonia: Secondary | ICD-10-CM | POA: Diagnosis not present

## 2022-05-07 DIAGNOSIS — F32A Depression, unspecified: Secondary | ICD-10-CM | POA: Diagnosis not present

## 2022-05-07 DIAGNOSIS — K219 Gastro-esophageal reflux disease without esophagitis: Secondary | ICD-10-CM | POA: Diagnosis not present

## 2022-05-07 DIAGNOSIS — J449 Chronic obstructive pulmonary disease, unspecified: Secondary | ICD-10-CM | POA: Diagnosis not present

## 2022-05-07 DIAGNOSIS — G319 Degenerative disease of nervous system, unspecified: Secondary | ICD-10-CM | POA: Diagnosis not present

## 2022-05-08 DIAGNOSIS — G319 Degenerative disease of nervous system, unspecified: Secondary | ICD-10-CM | POA: Diagnosis not present

## 2022-05-08 DIAGNOSIS — M170 Bilateral primary osteoarthritis of knee: Secondary | ICD-10-CM | POA: Diagnosis not present

## 2022-05-08 DIAGNOSIS — J8281 Chronic eosinophilic pneumonia: Secondary | ICD-10-CM | POA: Diagnosis not present

## 2022-05-08 DIAGNOSIS — Z1159 Encounter for screening for other viral diseases: Secondary | ICD-10-CM | POA: Diagnosis not present

## 2022-05-08 DIAGNOSIS — K219 Gastro-esophageal reflux disease without esophagitis: Secondary | ICD-10-CM | POA: Diagnosis not present

## 2022-05-08 DIAGNOSIS — F32A Depression, unspecified: Secondary | ICD-10-CM | POA: Diagnosis not present

## 2022-05-08 DIAGNOSIS — J449 Chronic obstructive pulmonary disease, unspecified: Secondary | ICD-10-CM | POA: Diagnosis not present

## 2022-05-08 DIAGNOSIS — J309 Allergic rhinitis, unspecified: Secondary | ICD-10-CM | POA: Diagnosis not present

## 2022-05-08 DIAGNOSIS — M810 Age-related osteoporosis without current pathological fracture: Secondary | ICD-10-CM | POA: Diagnosis not present

## 2022-05-08 DIAGNOSIS — I10 Essential (primary) hypertension: Secondary | ICD-10-CM | POA: Diagnosis not present

## 2022-05-08 DIAGNOSIS — D649 Anemia, unspecified: Secondary | ICD-10-CM | POA: Diagnosis not present

## 2022-05-08 DIAGNOSIS — S72141D Displaced intertrochanteric fracture of right femur, subsequent encounter for closed fracture with routine healing: Secondary | ICD-10-CM | POA: Diagnosis not present

## 2022-05-09 NOTE — Telephone Encounter (Signed)
Patients daughter [name] states she needs a letter from Dr. Marlou Sa with his signature stating that her mother saying that Olesya needs someone else to manage her finances and mobility issues she is incapable of doing theses things. Please advise and call her when the letter is ready to be picked up, she has granted permission for Dr. Marlou Sa call her and find out why this is needed and if he has questions. ?

## 2022-05-12 DIAGNOSIS — F330 Major depressive disorder, recurrent, mild: Secondary | ICD-10-CM | POA: Diagnosis not present

## 2022-05-12 DIAGNOSIS — F0390 Unspecified dementia without behavioral disturbance: Secondary | ICD-10-CM | POA: Diagnosis not present

## 2022-05-12 DIAGNOSIS — F5105 Insomnia due to other mental disorder: Secondary | ICD-10-CM | POA: Diagnosis not present

## 2022-05-12 DIAGNOSIS — F411 Generalized anxiety disorder: Secondary | ICD-10-CM | POA: Diagnosis not present

## 2022-05-12 NOTE — Telephone Encounter (Signed)
Not my call to make

## 2022-05-15 DIAGNOSIS — J449 Chronic obstructive pulmonary disease, unspecified: Secondary | ICD-10-CM | POA: Diagnosis not present

## 2022-05-15 DIAGNOSIS — I10 Essential (primary) hypertension: Secondary | ICD-10-CM | POA: Diagnosis not present

## 2022-05-15 DIAGNOSIS — S72141D Displaced intertrochanteric fracture of right femur, subsequent encounter for closed fracture with routine healing: Secondary | ICD-10-CM | POA: Diagnosis not present

## 2022-05-15 DIAGNOSIS — M6281 Muscle weakness (generalized): Secondary | ICD-10-CM | POA: Diagnosis not present

## 2022-05-15 DIAGNOSIS — M170 Bilateral primary osteoarthritis of knee: Secondary | ICD-10-CM | POA: Diagnosis not present

## 2022-05-15 DIAGNOSIS — F411 Generalized anxiety disorder: Secondary | ICD-10-CM | POA: Diagnosis not present

## 2022-05-16 ENCOUNTER — Ambulatory Visit: Payer: Medicare Other | Admitting: Cardiology

## 2022-05-19 DIAGNOSIS — M80051D Age-related osteoporosis with current pathological fracture, right femur, subsequent encounter for fracture with routine healing: Secondary | ICD-10-CM | POA: Diagnosis not present

## 2022-05-19 DIAGNOSIS — G3184 Mild cognitive impairment, so stated: Secondary | ICD-10-CM | POA: Diagnosis not present

## 2022-05-19 DIAGNOSIS — I081 Rheumatic disorders of both mitral and tricuspid valves: Secondary | ICD-10-CM | POA: Diagnosis not present

## 2022-05-19 DIAGNOSIS — M109 Gout, unspecified: Secondary | ICD-10-CM | POA: Diagnosis not present

## 2022-05-19 DIAGNOSIS — J45998 Other asthma: Secondary | ICD-10-CM | POA: Diagnosis not present

## 2022-05-19 DIAGNOSIS — E46 Unspecified protein-calorie malnutrition: Secondary | ICD-10-CM | POA: Diagnosis not present

## 2022-05-19 DIAGNOSIS — D649 Anemia, unspecified: Secondary | ICD-10-CM | POA: Diagnosis not present

## 2022-05-19 DIAGNOSIS — J449 Chronic obstructive pulmonary disease, unspecified: Secondary | ICD-10-CM | POA: Diagnosis not present

## 2022-05-19 DIAGNOSIS — F32A Depression, unspecified: Secondary | ICD-10-CM | POA: Diagnosis not present

## 2022-05-19 DIAGNOSIS — Z9181 History of falling: Secondary | ICD-10-CM | POA: Diagnosis not present

## 2022-05-19 DIAGNOSIS — I4891 Unspecified atrial fibrillation: Secondary | ICD-10-CM | POA: Diagnosis not present

## 2022-05-20 ENCOUNTER — Telehealth: Payer: Self-pay

## 2022-05-20 DIAGNOSIS — J45998 Other asthma: Secondary | ICD-10-CM | POA: Diagnosis not present

## 2022-05-20 DIAGNOSIS — I081 Rheumatic disorders of both mitral and tricuspid valves: Secondary | ICD-10-CM | POA: Diagnosis not present

## 2022-05-20 DIAGNOSIS — M80051D Age-related osteoporosis with current pathological fracture, right femur, subsequent encounter for fracture with routine healing: Secondary | ICD-10-CM | POA: Diagnosis not present

## 2022-05-20 DIAGNOSIS — J449 Chronic obstructive pulmonary disease, unspecified: Secondary | ICD-10-CM | POA: Diagnosis not present

## 2022-05-20 DIAGNOSIS — I4891 Unspecified atrial fibrillation: Secondary | ICD-10-CM | POA: Diagnosis not present

## 2022-05-20 DIAGNOSIS — F32A Depression, unspecified: Secondary | ICD-10-CM | POA: Diagnosis not present

## 2022-05-20 NOTE — Telephone Encounter (Signed)
Okay for orders? 

## 2022-05-20 NOTE — Telephone Encounter (Signed)
Lisa Larson is calling requesting VO for PT 2 time a week for 3 week 1 time a for 2 weeks  Please advise

## 2022-05-21 NOTE — Telephone Encounter (Signed)
Message left today with verbals. 

## 2022-05-22 NOTE — Patient Instructions (Addendum)
      Medications changes include :   none      Return in about 6 months (around 11/23/2022) for follow up.

## 2022-05-22 NOTE — Progress Notes (Signed)
Subjective:    Patient ID: Lisa Larson, female    DOB: 17-Jan-1941, 81 y.o.   MRN: [id number]     HPI Lisa Larson is here for follow up from rehab.    Hospitalized in March of this year after fall at home and fractured right femur.  She was also found to have afib.   Echo did not show valve disease.  She deferred DOAC and is currently only on aspirin.  She spent 2 months in [place] center.  She has been home 1 week.  She lives alone and typically uses the wheelchair or walker to get around.  She does not walk long distances.  She was resistant to physical therapy, but does have someone coming to the house.  She wishes she was able to do more at her house.  She cannot stand very long to make food.  She ate cereal, peanut butter sandwiches, prepackaged food-candy and snacks.  Has home OT, PT and nurse  Depression, anxiety -she is taking the sertraline, but her daughter thinks she may only be taking 1 pill and she states she has felt a little depressed recently.  She will increase this to 2 pills if she has not been taking that.  She does wish that she was able to do more at the house.  Taking correct dose of sertraline   Taking other medication-med list is correct.  Medications and allergies reviewed with patient and updated if appropriate.  Current Outpatient Medications on File Prior to Visit  Medication Sig Dispense Refill   acetaminophen (TYLENOL) 325 MG tablet Take 650 mg by mouth 3 (three) times a week.     albuterol (PROAIR HFA) 108 (90 Base) MCG/ACT inhaler Inhale 2 puffs into the lungs every 6 (six) hours as needed for wheezing or shortness of breath. 8 g 5   aspirin 81 MG chewable tablet Chew 1 tablet (81 mg total) by mouth 2 (two) times daily. 60 tablet 0   Cholecalciferol (VITAMIN D3) 2000 UNITS capsule Take 2,000 Units by mouth daily.     diltiazem (CARDIZEM CD) 120 MG 24 hr capsule Take 1 capsule (120 mg total) by mouth daily.     gabapentin (NEURONTIN)  300 MG capsule TAKE 1 CAPSULE(300 MG) BY MOUTH THREE TIMES DAILY 90 capsule 3   LORazepam (ATIVAN) 0.5 MG tablet Take 0.5 tablets (0.25 mg total) by mouth at bedtime as needed for anxiety. TAKE 1/2 TABLET BY MOUTH EVERY 8 TO 12 HOURS AS NEEDED 5 tablet 0   Mepolizumab (NUCALA) 100 MG/ML SOAJ Inject 1 mL (100 mg total) into the skin every 28 (twenty-eight) days. Deliver to patient's home for self-administration. 1 mL 5   predniSONE (DELTASONE) 5 MG tablet Take 1 tablet (5 mg total) by mouth daily with breakfast. 1 tablet daily, patient has instructions to double up during sick days. 100 tablet 3   PURE CALCIUM CARBONATE 1500 (600 Ca) MG TABS tablet Take 1 tablet by mouth 2 (two) times daily.     sertraline (ZOLOFT) 100 MG tablet TAKE 2 TABLETS(200 MG) BY MOUTH DAILY 180 tablet 0   thiamine (VITAMIN B-1) 100 MG tablet Take 100 mg by mouth daily as needed.      zinc gluconate 50 MG tablet Take 50 mg by mouth daily.     calcium carbonate (OS-CAL) 600 MG tablet Take 600 mg by mouth 2 (two) times daily. (Patient not taking: Reported on 05/23/2022)     Cetirizine HCl 10 MG  CAPS Take 1 capsule (10 mg total) by mouth daily. (Patient not taking: Reported on 05/23/2022) 90 capsule 3   fluticasone (FLONASE) 50 MCG/ACT nasal spray Place 2 sprays into both nostrils daily. (Patient not taking: Reported on 05/23/2022) 16 g 5   No current facility-administered medications on file prior to visit.     Review of Systems  Constitutional:  Positive for appetite change (low appetite). Negative for fever.  Respiratory:  Negative for cough, shortness of breath and wheezing.   Cardiovascular:  Positive for leg swelling. Negative for chest pain and palpitations.  Gastrointestinal:  Negative for abdominal pain, constipation and diarrhea.  Neurological:  Positive for light-headedness. Negative for headaches.  Psychiatric/Behavioral:  Positive for dysphoric mood. The patient is nervous/anxious (occ).        Memory is worse -  how to turn things at home on and off      Objective:   Vitals:   05/23/22 1513  BP: 114/80  Pulse: (!) 101  Temp: 98 F (36.7 C)  SpO2: 97%   BP Readings from Last 3 Encounters:  05/23/22 114/80  04/30/22 102/64  03/08/22 115/81   Wt Readings from Last 3 Encounters:  05/23/22 147 lb (66.7 kg)  04/30/22 147 lb (66.7 kg)  03/05/22 152 lb 5.4 oz (69.1 kg)   Body mass index is 28.71 kg/m.    Physical Exam Constitutional:      General: She is not in acute distress.    Appearance: Normal appearance.  HENT:     Head: Normocephalic and atraumatic.  Eyes:     Conjunctiva/sclera: Conjunctivae normal.  Cardiovascular:     Rate and Rhythm: Normal rate and regular rhythm.     Heart sounds: Normal heart sounds. No murmur heard. Pulmonary:     Effort: Pulmonary effort is normal. No respiratory distress.     Breath sounds: Normal breath sounds. No wheezing.  Musculoskeletal:     Cervical back: Neck supple.     Right lower leg: No edema.     Left lower leg: No edema.  Lymphadenopathy:     Cervical: No cervical adenopathy.  Skin:    General: Skin is warm and dry.     Findings: No rash.  Neurological:     Mental Status: She is alert. Mental status is at baseline.  Psychiatric:        Mood and Affect: Mood normal.        Behavior: Behavior normal.       Lab Results  Component Value Date   WBC 13.0 (H) 03/06/2022   HGB 10.2 (L) 03/06/2022   HCT 32.1 (L) 03/06/2022   PLT 120 (L) 03/06/2022   GLUCOSE 147 (H) 03/06/2022   CHOL 186 05/25/2020   TRIG 142.0 05/25/2020   HDL 67.60 05/25/2020   LDLDIRECT 121.0 11/16/2018   LDLCALC 90 05/25/2020   ALT 21 03/03/2022   AST 32 03/03/2022   NA 135 03/06/2022   K 4.0 03/06/2022   CL 104 03/06/2022   CREATININE 0.76 03/06/2022   BUN 21 03/06/2022   CO2 24 03/06/2022   TSH 1.439 03/03/2022   INR 1.0 03/03/2022   HGBA1C 5.3 05/25/2020     Assessment & Plan:    See Problem List for Assessment and Plan of chronic  medical problems.

## 2022-05-23 ENCOUNTER — Telehealth: Payer: Self-pay | Admitting: Orthopedic Surgery

## 2022-05-23 ENCOUNTER — Ambulatory Visit (INDEPENDENT_AMBULATORY_CARE_PROVIDER_SITE_OTHER): Payer: Medicare Other | Admitting: Internal Medicine

## 2022-05-23 ENCOUNTER — Encounter: Payer: Self-pay | Admitting: Internal Medicine

## 2022-05-23 VITALS — BP 114/80 | HR 101 | Temp 98.0°F | Ht 60.0 in | Wt 147.0 lb

## 2022-05-23 DIAGNOSIS — I10 Essential (primary) hypertension: Secondary | ICD-10-CM | POA: Diagnosis not present

## 2022-05-23 DIAGNOSIS — I4891 Unspecified atrial fibrillation: Secondary | ICD-10-CM | POA: Diagnosis not present

## 2022-05-23 DIAGNOSIS — F419 Anxiety disorder, unspecified: Secondary | ICD-10-CM | POA: Diagnosis not present

## 2022-05-23 DIAGNOSIS — E2749 Other adrenocortical insufficiency: Secondary | ICD-10-CM

## 2022-05-23 DIAGNOSIS — M80051D Age-related osteoporosis with current pathological fracture, right femur, subsequent encounter for fracture with routine healing: Secondary | ICD-10-CM | POA: Diagnosis not present

## 2022-05-23 DIAGNOSIS — F32A Depression, unspecified: Secondary | ICD-10-CM | POA: Diagnosis not present

## 2022-05-23 DIAGNOSIS — F411 Generalized anxiety disorder: Secondary | ICD-10-CM

## 2022-05-23 DIAGNOSIS — J45998 Other asthma: Secondary | ICD-10-CM | POA: Diagnosis not present

## 2022-05-23 DIAGNOSIS — J449 Chronic obstructive pulmonary disease, unspecified: Secondary | ICD-10-CM | POA: Diagnosis not present

## 2022-05-23 DIAGNOSIS — F3289 Other specified depressive episodes: Secondary | ICD-10-CM | POA: Diagnosis not present

## 2022-05-23 DIAGNOSIS — I081 Rheumatic disorders of both mitral and tricuspid valves: Secondary | ICD-10-CM | POA: Diagnosis not present

## 2022-05-23 MED ORDER — LORAZEPAM 0.5 MG PO TABS: 0.2500 mg | ORAL_TABLET | Freq: Every evening | ORAL | 0 refills | Status: DC | PRN

## 2022-05-23 NOTE — Assessment & Plan Note (Signed)
Chronic has seen endocrine and they felt she needed to stay on low-dose prednisone long-term Prednisone 5 mg daily with breakfast

## 2022-05-23 NOTE — Telephone Encounter (Signed)
Received call from [place] (OT) with [facility] needing verbal orders for HHOT  1 Wk 5. The number to contact Ailene Ravel is [phone]

## 2022-05-23 NOTE — Telephone Encounter (Signed)
Verbal order given  

## 2022-05-23 NOTE — Assessment & Plan Note (Signed)
Chronic Having increased depression-mostly due to not being able to do things at home that she wants and needs to do Has limited mobility and chronic pain Stressed that she needs to be doing physical therapy and exercising as much as she can even now it is hard and it causes pain Continue sertraline-her daughter will make sure she is taking 200 mg daily which I think she needs at this time

## 2022-05-23 NOTE — Assessment & Plan Note (Signed)
Chronic Continue sertraline-her daughter will make sure she is taking 200 mg daily Continue lorazepam 0. 25 mg at bedtime, can take 0.125 mg during the day if needed which she does not do often for anxiety

## 2022-05-23 NOTE — Assessment & Plan Note (Signed)
Chronic Blood pressure well controlled No longer on losartan-discussed with them that she does not need this Continue diltiazem 120 mg daily

## 2022-05-23 NOTE — Assessment & Plan Note (Signed)
New with recent hospitalization TSH was normal, echo without valvular disease She deferred antedate coagulation On aspirin 81 mg twice daily On diltiazem 120 mg daily Heart rate slightly elevated here today, BP controlled

## 2022-05-27 ENCOUNTER — Encounter: Payer: Self-pay | Admitting: Surgical

## 2022-05-27 ENCOUNTER — Ambulatory Visit (INDEPENDENT_AMBULATORY_CARE_PROVIDER_SITE_OTHER): Payer: Medicare Other | Admitting: Surgical

## 2022-05-27 DIAGNOSIS — Z8781 Personal history of (healed) traumatic fracture: Secondary | ICD-10-CM
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
## 2022-05-28 ENCOUNTER — Telehealth: Payer: Self-pay | Admitting: Orthopedic Surgery

## 2022-05-28 ENCOUNTER — Encounter: Payer: Self-pay | Admitting: Cardiology

## 2022-05-28 ENCOUNTER — Ambulatory Visit (INDEPENDENT_AMBULATORY_CARE_PROVIDER_SITE_OTHER): Payer: Medicare Other | Admitting: Cardiology

## 2022-05-28 VITALS — BP 116/60 | HR 84 | Ht 60.0 in

## 2022-05-28 DIAGNOSIS — Z79899 Other long term (current) drug therapy: Secondary | ICD-10-CM | POA: Diagnosis not present

## 2022-05-28 DIAGNOSIS — I480 Paroxysmal atrial fibrillation: Secondary | ICD-10-CM | POA: Diagnosis not present

## 2022-05-28 DIAGNOSIS — I4891 Unspecified atrial fibrillation: Secondary | ICD-10-CM | POA: Diagnosis not present

## 2022-05-28 DIAGNOSIS — J449 Chronic obstructive pulmonary disease, unspecified: Secondary | ICD-10-CM | POA: Diagnosis not present

## 2022-05-28 DIAGNOSIS — F32A Depression, unspecified: Secondary | ICD-10-CM | POA: Diagnosis not present

## 2022-05-28 DIAGNOSIS — I081 Rheumatic disorders of both mitral and tricuspid valves: Secondary | ICD-10-CM | POA: Diagnosis not present

## 2022-05-28 DIAGNOSIS — M80051D Age-related osteoporosis with current pathological fracture, right femur, subsequent encounter for fracture with routine healing: Secondary | ICD-10-CM | POA: Diagnosis not present

## 2022-05-28 DIAGNOSIS — I10 Essential (primary) hypertension: Secondary | ICD-10-CM | POA: Diagnosis not present

## 2022-05-28 DIAGNOSIS — J45998 Other asthma: Secondary | ICD-10-CM | POA: Diagnosis not present
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
## 2022-05-28 NOTE — Patient Instructions (Signed)
Medication Instructions:  Your physician recommends that you continue on your current medications as directed. Please refer to the Current Medication list given to you today.  *If you need a refill on your cardiac medications before your next appointment, please call your pharmacy*   Lab Work: Your physician recommends that you return for lab work in:  TODAY: BMET, [place], CBC If you have labs (blood work) drawn today and your tests are completely normal, you will receive your results only by: MyChart Message (if you have MyChart) OR A paper copy in the mail If you have any lab test that is abnormal or we need to change your treatment, we will call you to review the results.   Testing/Procedures: None   Follow-Up: At [facility], you and your health needs are our priority.  As part of our continuing mission to provide you with exceptional heart care, we have created designated Provider Care Teams.  These Care Teams include your primary Cardiologist (physician) and Advanced Practice Providers (APPs -  Physician Assistants and Nurse Practitioners) who all work together to provide you with the care you need, when you need it.  We recommend signing up for the patient portal called "MyChart".  Sign up information is provided on this After Visit Summary.  MyChart is used to connect with patients for Virtual Visits (Telemedicine).  Patients are able to view lab/test results, encounter notes, upcoming appointments, etc.  Non-urgent messages can be sent to your provider as well.   To learn more about what you can do with MyChart, go to [email or web address].    Your next appointment:   6 month(s)  The format for your next appointment:   In Person  Provider:   Berniece Salines, DO     Other Instructions   Important Information About Sugar

## 2022-05-28 NOTE — Telephone Encounter (Signed)
Pt's daughter [name] called on behalf of pt. Pt is unhappy with [facility] services. She states pt would like for another referral be sent for Pt and OT. Please call pt and daughter about this matter at [phone].

## 2022-05-29 DIAGNOSIS — J449 Chronic obstructive pulmonary disease, unspecified: Secondary | ICD-10-CM | POA: Diagnosis not present

## 2022-05-29 DIAGNOSIS — F32A Depression, unspecified: Secondary | ICD-10-CM | POA: Diagnosis not present

## 2022-05-29 DIAGNOSIS — I4891 Unspecified atrial fibrillation: Secondary | ICD-10-CM | POA: Diagnosis not present

## 2022-05-29 DIAGNOSIS — I081 Rheumatic disorders of both mitral and tricuspid valves: Secondary | ICD-10-CM | POA: Diagnosis not present

## 2022-05-29 DIAGNOSIS — J45998 Other asthma: Secondary | ICD-10-CM | POA: Diagnosis not present

## 2022-05-29 DIAGNOSIS — M80051D Age-related osteoporosis with current pathological fracture, right femur, subsequent encounter for fracture with routine healing: Secondary | ICD-10-CM | POA: Diagnosis not present

## 2022-05-29 LAB — BASIC METABOLIC PANEL
BUN/Creatinine Ratio: 24 (ref 12–28)
BUN: 20 mg/dL (ref 8–27)
CO2: 21 mmol/L (ref 20–29)
Calcium: 9.9 mg/dL (ref 8.7–10.3)
Chloride: 103 mmol/L (ref 96–106)
Creatinine, Ser: 0.83 mg/dL (ref 0.57–1.00)
Glucose: 97 mg/dL (ref 70–99)
Potassium: 4.3 mmol/L (ref 3.5–5.2)
Sodium: 140 mmol/L (ref 134–144)
eGFR: 72 mL/min/{1.73_m2} (ref >59)

## 2022-05-29 LAB — CBC WITH DIFFERENTIAL/PLATELET
Basophils Absolute: 0 10*3/uL (ref 0.0–0.2)
Basos: 0 %
EOS (ABSOLUTE): 0 10*3/uL (ref 0.0–0.4)
Eos: 1 %
Hematocrit: 41.2 % (ref 34.0–46.6)
Hemoglobin: 13.4 g/dL (ref 11.1–15.9)
Immature Grans (Abs): 0 10*3/uL (ref 0.0–0.1)
Immature Granulocytes: 0 %
Lymphocytes Absolute: 0.7 10*3/uL (ref 0.7–3.1)
Lymphs: 9 %
MCH: 28.2 pg (ref 26.6–33.0)
MCHC: 32.5 g/dL (ref 31.5–35.7)
MCV: 87 fL (ref 79–97)
Monocytes Absolute: 0.5 10*3/uL (ref 0.1–0.9)
Monocytes: 7 %
Neutrophils Absolute: 6.8 10*3/uL (ref 1.4–7.0)
Neutrophils: 83 %
Platelets: 194 10*3/uL (ref 150–450)
RBC: 4.75 x10E6/uL (ref 3.77–5.28)
RDW: 13.9 % (ref 11.7–15.4)
WBC: 8.1 10*3/uL (ref 3.4–10.8)

## 2022-05-29 LAB — MAGNESIUM: Magnesium: 2.1 mg/dL (ref 1.6–2.3)

## 2022-05-29 NOTE — Telephone Encounter (Signed)
IC discussed with [name]. She will talk over with her mom and call me back.

## 2022-05-29 NOTE — Telephone Encounter (Signed)
Okay for PT and OT 2 times a week for range of motion and strengthening of the affected extremity for 6 weeks thanks

## 2022-05-30 DIAGNOSIS — I4891 Unspecified atrial fibrillation: Secondary | ICD-10-CM | POA: Diagnosis not present

## 2022-05-30 DIAGNOSIS — M80051D Age-related osteoporosis with current pathological fracture, right femur, subsequent encounter for fracture with routine healing: Secondary | ICD-10-CM | POA: Diagnosis not present

## 2022-05-30 DIAGNOSIS — F32A Depression, unspecified: Secondary | ICD-10-CM | POA: Diagnosis not present

## 2022-05-30 DIAGNOSIS — I081 Rheumatic disorders of both mitral and tricuspid valves: Secondary | ICD-10-CM | POA: Diagnosis not present

## 2022-05-30 DIAGNOSIS — J449 Chronic obstructive pulmonary disease, unspecified: Secondary | ICD-10-CM | POA: Diagnosis not present

## 2022-05-30 DIAGNOSIS — J45998 Other asthma: Secondary | ICD-10-CM | POA: Diagnosis not present

## 2022-06-03 DIAGNOSIS — I081 Rheumatic disorders of both mitral and tricuspid valves: Secondary | ICD-10-CM | POA: Diagnosis not present

## 2022-06-03 DIAGNOSIS — M80051D Age-related osteoporosis with current pathological fracture, right femur, subsequent encounter for fracture with routine healing: Secondary | ICD-10-CM | POA: Diagnosis not present

## 2022-06-03 DIAGNOSIS — I4891 Unspecified atrial fibrillation: Secondary | ICD-10-CM | POA: Diagnosis not present

## 2022-06-03 DIAGNOSIS — J449 Chronic obstructive pulmonary disease, unspecified: Secondary | ICD-10-CM | POA: Diagnosis not present

## 2022-06-03 DIAGNOSIS — F32A Depression, unspecified: Secondary | ICD-10-CM | POA: Diagnosis not present

## 2022-06-03 DIAGNOSIS — J45998 Other asthma: Secondary | ICD-10-CM | POA: Diagnosis not present

## 2022-06-04 ENCOUNTER — Telehealth: Payer: Self-pay | Admitting: Internal Medicine

## 2022-06-04 DIAGNOSIS — F32A Depression, unspecified: Secondary | ICD-10-CM | POA: Diagnosis not present

## 2022-06-04 DIAGNOSIS — J449 Chronic obstructive pulmonary disease, unspecified: Secondary | ICD-10-CM | POA: Diagnosis not present

## 2022-06-04 DIAGNOSIS — I4891 Unspecified atrial fibrillation: Secondary | ICD-10-CM | POA: Diagnosis not present

## 2022-06-04 DIAGNOSIS — I081 Rheumatic disorders of both mitral and tricuspid valves: Secondary | ICD-10-CM | POA: Diagnosis not present

## 2022-06-04 DIAGNOSIS — M80051D Age-related osteoporosis with current pathological fracture, right femur, subsequent encounter for fracture with routine healing: Secondary | ICD-10-CM | POA: Diagnosis not present

## 2022-06-04 DIAGNOSIS — J45998 Other asthma: Secondary | ICD-10-CM | POA: Diagnosis not present

## 2022-06-04 NOTE — Telephone Encounter (Signed)
Patient nurse called in stating patient would need a Pediatric walker being she is so small it will help her move around a lot easier being her dr ordered her to walk around more.

## 2022-06-04 NOTE — Telephone Encounter (Signed)
IC advised ok for order due to patients height.  They are requesting for it to be put in through parachute. Can you please help me with this?

## 2022-06-04 NOTE — Telephone Encounter (Signed)
History of fracture of right hip Z87.81 Ship to home is best

## 2022-06-04 NOTE — Telephone Encounter (Signed)
Patient would like to know why she has to take tums everyday instead of a calcium supplement:  please advise.

## 2022-06-05 NOTE — Telephone Encounter (Signed)
Either is okay.  The goal is 600 mg of calcium twice a day.  She also needs to be taking vitamin D.

## 2022-06-06 DIAGNOSIS — F32A Depression, unspecified: Secondary | ICD-10-CM | POA: Diagnosis not present

## 2022-06-06 DIAGNOSIS — I081 Rheumatic disorders of both mitral and tricuspid valves: Secondary | ICD-10-CM | POA: Diagnosis not present

## 2022-06-06 DIAGNOSIS — I4891 Unspecified atrial fibrillation: Secondary | ICD-10-CM | POA: Diagnosis not present

## 2022-06-06 DIAGNOSIS — M80051D Age-related osteoporosis with current pathological fracture, right femur, subsequent encounter for fracture with routine healing: Secondary | ICD-10-CM | POA: Diagnosis not present

## 2022-06-06 DIAGNOSIS — J449 Chronic obstructive pulmonary disease, unspecified: Secondary | ICD-10-CM | POA: Diagnosis not present

## 2022-06-06 DIAGNOSIS — J45998 Other asthma: Secondary | ICD-10-CM | POA: Diagnosis not present

## 2022-06-06 NOTE — Telephone Encounter (Signed)
Spoke with patient's daughter today. 

## 2022-06-09 ENCOUNTER — Other Ambulatory Visit: Payer: Self-pay

## 2022-06-09 MED ORDER — DILTIAZEM HCL ER COATED BEADS 120 MG PO CP24: 120.0000 mg | ORAL_CAPSULE | Freq: Every day | ORAL | 3 refills | Status: DC

## 2022-06-11 ENCOUNTER — Encounter: Payer: Medicare Other | Admitting: Surgical

## 2022-06-11 DIAGNOSIS — I4891 Unspecified atrial fibrillation: Secondary | ICD-10-CM | POA: Diagnosis not present

## 2022-06-11 DIAGNOSIS — J449 Chronic obstructive pulmonary disease, unspecified: Secondary | ICD-10-CM | POA: Diagnosis not present

## 2022-06-11 DIAGNOSIS — M80051D Age-related osteoporosis with current pathological fracture, right femur, subsequent encounter for fracture with routine healing: Secondary | ICD-10-CM | POA: Diagnosis not present

## 2022-06-11 DIAGNOSIS — J45998 Other asthma: Secondary | ICD-10-CM | POA: Diagnosis not present

## 2022-06-11 DIAGNOSIS — I081 Rheumatic disorders of both mitral and tricuspid valves: Secondary | ICD-10-CM | POA: Diagnosis not present

## 2022-06-11 DIAGNOSIS — F32A Depression, unspecified: Secondary | ICD-10-CM | POA: Diagnosis not present

## 2022-06-13 DIAGNOSIS — I081 Rheumatic disorders of both mitral and tricuspid valves: Secondary | ICD-10-CM | POA: Diagnosis not present

## 2022-06-13 DIAGNOSIS — J449 Chronic obstructive pulmonary disease, unspecified: Secondary | ICD-10-CM | POA: Diagnosis not present

## 2022-06-13 DIAGNOSIS — I4891 Unspecified atrial fibrillation: Secondary | ICD-10-CM | POA: Diagnosis not present

## 2022-06-13 DIAGNOSIS — J45998 Other asthma: Secondary | ICD-10-CM | POA: Diagnosis not present

## 2022-06-13 DIAGNOSIS — M80051D Age-related osteoporosis with current pathological fracture, right femur, subsequent encounter for fracture with routine healing: Secondary | ICD-10-CM | POA: Diagnosis not present

## 2022-06-13 DIAGNOSIS — F32A Depression, unspecified: Secondary | ICD-10-CM | POA: Diagnosis not present

## 2022-06-16 ENCOUNTER — Encounter: Payer: Medicare Other | Admitting: Orthopedic Surgery

## 2022-06-17 DIAGNOSIS — F32A Depression, unspecified: Secondary | ICD-10-CM | POA: Diagnosis not present

## 2022-06-17 DIAGNOSIS — M80051D Age-related osteoporosis with current pathological fracture, right femur, subsequent encounter for fracture with routine healing: Secondary | ICD-10-CM | POA: Diagnosis not present

## 2022-06-17 DIAGNOSIS — J449 Chronic obstructive pulmonary disease, unspecified: Secondary | ICD-10-CM | POA: Diagnosis not present

## 2022-06-17 DIAGNOSIS — I4891 Unspecified atrial fibrillation: Secondary | ICD-10-CM | POA: Diagnosis not present

## 2022-06-17 DIAGNOSIS — I081 Rheumatic disorders of both mitral and tricuspid valves: Secondary | ICD-10-CM | POA: Diagnosis not present

## 2022-06-17 DIAGNOSIS — J45998 Other asthma: Secondary | ICD-10-CM | POA: Diagnosis not present

## 2022-06-18 DIAGNOSIS — M80051D Age-related osteoporosis with current pathological fracture, right femur, subsequent encounter for fracture with routine healing: Secondary | ICD-10-CM | POA: Diagnosis not present

## 2022-06-18 DIAGNOSIS — G3184 Mild cognitive impairment, so stated: Secondary | ICD-10-CM | POA: Diagnosis not present

## 2022-06-18 DIAGNOSIS — Z9181 History of falling: Secondary | ICD-10-CM | POA: Diagnosis not present

## 2022-06-18 DIAGNOSIS — E46 Unspecified protein-calorie malnutrition: Secondary | ICD-10-CM | POA: Diagnosis not present

## 2022-06-18 DIAGNOSIS — D649 Anemia, unspecified: Secondary | ICD-10-CM | POA: Diagnosis not present

## 2022-06-18 DIAGNOSIS — M109 Gout, unspecified: Secondary | ICD-10-CM | POA: Diagnosis not present

## 2022-06-18 DIAGNOSIS — J45998 Other asthma: Secondary | ICD-10-CM | POA: Diagnosis not present

## 2022-06-18 DIAGNOSIS — I4891 Unspecified atrial fibrillation: Secondary | ICD-10-CM | POA: Diagnosis not present

## 2022-06-18 DIAGNOSIS — I081 Rheumatic disorders of both mitral and tricuspid valves: Secondary | ICD-10-CM | POA: Diagnosis not present

## 2022-06-18 DIAGNOSIS — J449 Chronic obstructive pulmonary disease, unspecified: Secondary | ICD-10-CM | POA: Diagnosis not present

## 2022-06-18 DIAGNOSIS — F32A Depression, unspecified: Secondary | ICD-10-CM | POA: Diagnosis not present

## 2022-06-19 ENCOUNTER — Other Ambulatory Visit: Payer: Self-pay | Admitting: Internal Medicine

## 2022-06-19 ENCOUNTER — Telehealth: Payer: Self-pay | Admitting: Internal Medicine

## 2022-06-19 ENCOUNTER — Telehealth: Payer: Self-pay | Admitting: Orthopedic Surgery

## 2022-06-19 DIAGNOSIS — F32A Depression, unspecified: Secondary | ICD-10-CM | POA: Diagnosis not present

## 2022-06-19 DIAGNOSIS — F419 Anxiety disorder, unspecified: Secondary | ICD-10-CM

## 2022-06-19 DIAGNOSIS — J449 Chronic obstructive pulmonary disease, unspecified: Secondary | ICD-10-CM | POA: Diagnosis not present

## 2022-06-19 DIAGNOSIS — I4891 Unspecified atrial fibrillation: Secondary | ICD-10-CM | POA: Diagnosis not present

## 2022-06-19 DIAGNOSIS — M80051D Age-related osteoporosis with current pathological fracture, right femur, subsequent encounter for fracture with routine healing: Secondary | ICD-10-CM | POA: Diagnosis not present

## 2022-06-19 DIAGNOSIS — J45998 Other asthma: Secondary | ICD-10-CM | POA: Diagnosis not present

## 2022-06-19 DIAGNOSIS — I081 Rheumatic disorders of both mitral and tricuspid valves: Secondary | ICD-10-CM | POA: Diagnosis not present

## 2022-06-19 NOTE — Telephone Encounter (Signed)
Leann (RN) from [facility] called for an update on discharge of pt for home health nursing. Pt will be discharged next week. If any questions please call Leann at [phone].

## 2022-06-19 NOTE — Telephone Encounter (Signed)
Pt requesting LORazepam (ATIVAN) 0.5 MG tablet [place], [place] Phone:  [phone]  Fax:  [phone]

## 2022-06-27 ENCOUNTER — Telehealth: Payer: Self-pay | Admitting: Internal Medicine

## 2022-06-27 NOTE — Telephone Encounter (Signed)
Dr.Mottinger calls today in regards to PT. PT has an abscessed tooth and she was wanting some advice on how to proceed (if she could prescribe an antibiotic/what to prescribe) ?   She does note that PT is not dealing with any pain currently and does understand that PT has other underlying health issues going on.  CB: [phone]

## 2022-06-27 NOTE — Telephone Encounter (Signed)
Okay for acute visit can be virtual if needed.

## 2022-06-30 NOTE — Telephone Encounter (Signed)
LDVM for pt to call the clinic to set-up a visit.

## 2022-07-03 ENCOUNTER — Encounter: Payer: Self-pay | Admitting: Family Medicine

## 2022-07-03 ENCOUNTER — Telehealth: Payer: Self-pay

## 2022-07-03 ENCOUNTER — Other Ambulatory Visit: Payer: Self-pay | Admitting: Internal Medicine

## 2022-07-03 ENCOUNTER — Telehealth (INDEPENDENT_AMBULATORY_CARE_PROVIDER_SITE_OTHER): Payer: Medicare Other | Admitting: Family Medicine

## 2022-07-03 DIAGNOSIS — E2749 Other adrenocortical insufficiency: Secondary | ICD-10-CM | POA: Diagnosis not present

## 2022-07-03 DIAGNOSIS — F411 Generalized anxiety disorder: Secondary | ICD-10-CM

## 2022-07-03 DIAGNOSIS — K047 Periapical abscess without sinus: Secondary | ICD-10-CM | POA: Insufficient documentation

## 2022-07-03 MED ORDER — PREDNISONE 5 MG PO TABS: 5.0000 mg | ORAL_TABLET | Freq: Every day | ORAL | 0 refills | Status: DC

## 2022-07-03 MED ORDER — AMOXICILLIN 875 MG PO TABS
875.0000 mg | ORAL_TABLET | Freq: Two times a day (BID) | ORAL | 0 refills | Status: AC
Start: 2022-07-03 — End: 2022-07-13

## 2022-07-03 NOTE — Telephone Encounter (Signed)
Pt daughter is requesting refill for: LORazepam (ATIVAN) 0.5 MG tablet  Pharmacy: [place], [place] 05/23/22 ROV 11/26/22

## 2022-07-03 NOTE — Assessment & Plan Note (Signed)
Refilled prednisone 5 mg x 30 days until she has time to follow-up with her PCP.

## 2022-07-03 NOTE — Assessment & Plan Note (Addendum)
Discussed that it is not appropriate for me to refill lorazepam today since her PCP gave her refills on 06/19/2022.  States she is not used to taking the 0.5 mg tablets and wants the 1 mg tablets.  She will follow-up with her PCP regarding the use of this medication.
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
## 2022-07-03 NOTE — Assessment & Plan Note (Signed)
Unable to examine patient due to telephone call only today.  She denies pain and has a history of a root canal to the same area of the dental abscess.  Amoxicillin prescribed.  She will call and schedule with the oral surgeon.

## 2022-07-04 ENCOUNTER — Other Ambulatory Visit: Payer: Self-pay | Admitting: Internal Medicine

## 2022-07-04 DIAGNOSIS — F419 Anxiety disorder, unspecified: Secondary | ICD-10-CM

## 2022-07-04 MED ORDER — LORAZEPAM 0.5 MG PO TABS: ORAL_TABLET | ORAL | 0 refills | Status: DC

## 2022-07-07 ENCOUNTER — Ambulatory Visit (INDEPENDENT_AMBULATORY_CARE_PROVIDER_SITE_OTHER): Payer: Medicare Other | Admitting: Orthopedic Surgery

## 2022-07-07 ENCOUNTER — Encounter: Payer: Self-pay | Admitting: Orthopedic Surgery

## 2022-07-07 DIAGNOSIS — Z8781 Personal history of (healed) traumatic fracture: Secondary | ICD-10-CM
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
## 2022-07-25 ENCOUNTER — Ambulatory Visit: Payer: Medicare Other

## 2022-08-01 ENCOUNTER — Ambulatory Visit (INDEPENDENT_AMBULATORY_CARE_PROVIDER_SITE_OTHER): Payer: Medicare Other

## 2022-08-01 DIAGNOSIS — Z1211 Encounter for screening for malignant neoplasm of colon: Secondary | ICD-10-CM

## 2022-08-01 DIAGNOSIS — Z0000 Encounter for general adult medical examination without abnormal findings: Secondary | ICD-10-CM
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
## 2022-08-01 NOTE — Patient Instructions (Signed)
It was a pleasure speaking with you today   Please follow up in 1 year  

## 2022-08-04 ENCOUNTER — Other Ambulatory Visit: Payer: Self-pay | Admitting: Internal Medicine

## 2022-08-04 ENCOUNTER — Telehealth: Payer: Self-pay | Admitting: Internal Medicine

## 2022-08-04 DIAGNOSIS — F419 Anxiety disorder, unspecified: Secondary | ICD-10-CM

## 2022-08-04 MED ORDER — LORAZEPAM 0.5 MG PO TABS: ORAL_TABLET | ORAL | 0 refills | Status: DC

## 2022-08-04 NOTE — Telephone Encounter (Signed)
Patient would like 1/2 milligram of lorazapam sent to [facility] in [place].,  Patient would like something else called in for anxiety if Dr. Quay Burow does not want to call in the lorazapam.

## 2022-08-04 NOTE — Telephone Encounter (Signed)
Prescription sent to pharmacy.

## 2022-08-04 NOTE — Progress Notes (Signed)
I connected with  Lisa Larson on 08/04/22 by a video enabled telemedicine application and verified that I am speaking with the correct person using two identifiers.   I discussed the limitations of evaluation and management by telemedicine. The patient expressed understanding and agreed to proceed.

## 2022-08-07 NOTE — Progress Notes (Deleted)
I discussed the limitations of evaluation and management by telemedicine. The patient expressed understanding and agreed to proceed.
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
## 2022-08-07 NOTE — Progress Notes (Deleted)
I connected with  Lisa Larson on 08/01/2022 by an audio enabled telemedicine application and verified that I am speaking with the correct person using two identifiers.   Location of patient Home Location of provider: Clinic Present for assessment: Lisa Larson and Maryelizabeth Kaufmann   I discussed the limitations of evaluation and management by telemedicine. The patient expressed understanding and agreed to proceed.

## 2022-08-07 NOTE — Progress Notes (Deleted)
I connected with  Lisa Larson on 08/01/2022   by a video enabled telemedicine application and verified that I am speaking with the correct person using two identifiers.  Location of patient: Home Location of provider: Clinic Present during assessment: Lisa Larson and Maryelizabeth Kaufmann    I discussed the limitations of evaluation and management by telemedicine. The patient expressed understanding and agreed to proceed.
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
## 2022-08-20 ENCOUNTER — Other Ambulatory Visit: Payer: Self-pay | Admitting: Internal Medicine

## 2022-08-22 ENCOUNTER — Telehealth: Payer: Self-pay | Admitting: Internal Medicine

## 2022-08-22 DIAGNOSIS — E2749 Other adrenocortical insufficiency: Secondary | ICD-10-CM

## 2022-08-22 MED ORDER — PREDNISONE 5 MG PO TABS: 5.0000 mg | ORAL_TABLET | Freq: Every day | ORAL | 0 refills | Status: DC

## 2022-08-22 NOTE — Telephone Encounter (Signed)
RX now sent to preferred pharmacy.  

## 2022-08-22 NOTE — Telephone Encounter (Signed)
MEDICATION: Prednisone 5 mg  PHARMACY:  [facility]  HAS THE PATIENT CONTACTED THEIR PHARMACY?  no  IS THIS A 90 DAY SUPPLY : unknown  IS PATIENT OUT OF MEDICATION: yes  IF NOT; HOW MUCH IS LEFT:   LAST APPOINTMENT DATE:05/23/2021  NEXT APPOINTMENT DATE: 02/09/2023  DO WE HAVE YOUR PERMISSION TO LEAVE A DETAILED MESSAGE?:  OTHER COMMENTS:    **Let patient know to contact pharmacy at the end of the day to make sure medication is ready. **  ** Please notify patient to allow 48-72 hours to process**  **Encourage patient to contact the pharmacy for refills or they can request refills through [facility]**

## 2022-08-27 ENCOUNTER — Other Ambulatory Visit: Payer: Self-pay | Admitting: Internal Medicine

## 2022-09-04 ENCOUNTER — Other Ambulatory Visit: Payer: Self-pay | Admitting: Internal Medicine

## 2022-09-04 DIAGNOSIS — E2749 Other adrenocortical insufficiency: Secondary | ICD-10-CM

## 2022-09-10 ENCOUNTER — Other Ambulatory Visit: Payer: Self-pay | Admitting: Internal Medicine

## 2022-09-10 ENCOUNTER — Telehealth: Payer: Self-pay

## 2022-09-10 DIAGNOSIS — F419 Anxiety disorder, unspecified: Secondary | ICD-10-CM

## 2022-09-10 DIAGNOSIS — E2749 Other adrenocortical insufficiency: Secondary | ICD-10-CM

## 2022-09-10 NOTE — Telephone Encounter (Signed)
PLease bring her for an OV 9/27th at 8:10 am

## 2022-09-12 ENCOUNTER — Other Ambulatory Visit: Payer: Self-pay | Admitting: Internal Medicine

## 2022-09-12 NOTE — Telephone Encounter (Signed)
Mychart message sent to patient as well.

## 2022-09-18 NOTE — Telephone Encounter (Signed)
LMx1 to return call and schedule earlier at 09/24/22 @ 08:10 AM - MyChart message sent x2

## 2022-09-19 NOTE — Telephone Encounter (Signed)
3rd attempt to schedule patient - LM to call back and schedule

## 2022-09-25 ENCOUNTER — Other Ambulatory Visit: Payer: Self-pay | Admitting: Internal Medicine

## 2022-10-06 ENCOUNTER — Telehealth: Payer: Self-pay

## 2022-10-06 ENCOUNTER — Telehealth: Payer: Self-pay | Admitting: Internal Medicine

## 2022-10-06 NOTE — Telephone Encounter (Signed)
Patient is calling in stating she received a bill for $175 for DOS 07/06. She did a Hospital doctor with [facility] but patient has medicare and [employer].

## 2022-10-06 NOTE — Telephone Encounter (Signed)
Patient is concerned about the cost of her video visit  on 07/03/2022 - she would like to talk to you about this.

## 2022-10-07 ENCOUNTER — Other Ambulatory Visit: Payer: Self-pay | Admitting: Internal Medicine

## 2022-10-07 DIAGNOSIS — E2749 Other adrenocortical insufficiency: Secondary | ICD-10-CM

## 2022-10-15 DIAGNOSIS — M542 Cervicalgia: Secondary | ICD-10-CM | POA: Diagnosis not present

## 2022-10-15 DIAGNOSIS — Z6826 Body mass index (BMI) 26.0-26.9, adult: Secondary | ICD-10-CM | POA: Diagnosis not present

## 2022-10-15 DIAGNOSIS — S12112A Nondisplaced Type II dens fracture, initial encounter for closed fracture: Secondary | ICD-10-CM | POA: Diagnosis not present

## 2022-10-20 ENCOUNTER — Telehealth: Payer: Self-pay | Admitting: Pulmonary Disease

## 2022-10-20 NOTE — Telephone Encounter (Signed)
Please schedule office visit this week with me or an NP to assess further.

## 2022-10-20 NOTE — Telephone Encounter (Signed)
Spoke with pt and scheduled for acute OV with Dr. Halford Chessman on 10/21/22 at 1:00pm. Pt stated understanding. Nothing further needed at this time.

## 2022-10-20 NOTE — Telephone Encounter (Signed)
Called and spoke with patient. She stated that she has had a productive cough for the past few days. Her phlegm started out as yellow but recently turned green. She denied any increased SOB, wheezing, body aches or fever. She stated that she does not feel bad at all but she would like to get rid of the cough.   Pharmacy is [facility].  Dr. Halford Chessman, can you please advise? Thanks!

## 2022-10-21 ENCOUNTER — Encounter: Payer: Self-pay | Admitting: Pulmonary Disease

## 2022-10-21 ENCOUNTER — Ambulatory Visit (INDEPENDENT_AMBULATORY_CARE_PROVIDER_SITE_OTHER): Payer: Medicare Other | Admitting: Pulmonary Disease

## 2022-10-21 VITALS — BP 118/76 | HR 84 | Temp 97.8°F | Ht 60.0 in | Wt 137.2 lb

## 2022-10-21 DIAGNOSIS — E2749 Other adrenocortical insufficiency: Secondary | ICD-10-CM

## 2022-10-21 DIAGNOSIS — J8289 Other pulmonary eosinophilia, not elsewhere classified: Secondary | ICD-10-CM

## 2022-10-21 DIAGNOSIS — J069 Acute upper respiratory infection, unspecified: Secondary | ICD-10-CM | POA: Diagnosis not present

## 2022-10-21 DIAGNOSIS — J4550 Severe persistent asthma, uncomplicated: Secondary | ICD-10-CM | POA: Diagnosis not present

## 2022-10-21 NOTE — Progress Notes (Signed)
[facility]  Chief Complaint  Patient presents with   Acute Visit    Cough with green mucus/sputum.  Denies SOB or wheeze. Sx x 5 days.  Discuss getting pt assistance for Nucala inj.    Constitutional:  BP 118/76 (BP Location: Right Arm, Patient Position: Sitting, Cuff Size: Normal)   Pulse 84   Temp 97.8 F (36.6 C) (Oral)   Ht 5' (1.524 m)   Wt 137 lb 3.2 oz (62.2 kg)   SpO2 99%   BMI 26.80 kg/m   Past Medical History:  Anemia, Anxiety, Arthritis, C2 fx, Cataract, Chronic headaches, Depression, GERD, Gout, Hiatal hernia, HTN, Osteoporosis, Urinary incontinence  Past Surgical History:  She  has a past surgical history that includes Shoulder surgery (Left, 08-2008); Total abdominal hysterectomy; Tonsillectomy and adenoidectomy; colonoscopy with polypectomy (06/2013); Nasal sinus surgery; Appendectomy; Esophageal dilation; Bronchoscopy (12-2010); Cataract extraction w/ intraocular lens  implant, bilateral (Bilateral); Knee arthroscopy (Right, 06/18/2015); Tonsillectomy; Cholecystectomy (N/A, 05/23/2016); Upper gastrointestinal endoscopy; Colonoscopy; Polypectomy; Adenoidectomy; and Intramedullary (im) nail intertrochanteric (Right, 03/04/2022).  Brief Summary:  Lisa Larson is a 81 y.o. 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      Subjective:   She is here with her son.  She developed a productive cough a few days ago.  Phlegm is yellow to green.  No fever, or wheezing.  Since she called the office yesterday, she is feeling some improvement.  She has some sinus congestion, but no sinus pain or pressure.  She has been using mucinex and albuterol, and these have helped.  She feels her breathing is much better since she has been using nucala.  She wants to find out how to keep up with financial assistance for this.  Physical Exam:   Appearance - well kempt, sitting in her  wheelchair  ENMT - no sinus tenderness, no oral exudate, no LAN, Mallampati 2 airway, no stridor  Respiratory - equal breath sounds bilaterally, no wheezing or rales  CV - s1s2 regular rate and rhythm, no murmurs  Ext - no clubbing, no edema  Skin - no rashes  Psych - normal mood and affect     Pulmonary testing:  CBC 01/14/11 >> 39% eosinophils 01/14/11 >> HIV negative, ACE 39, RF negative, ANA 1:40, ANCA negative   BAL 01/21/11 >> 48% Eosinophils   IgE 02/25/11 >> 282 PFT 11/04/12 >> FEV1 1.77 (87%), FEV1% 68, TLC 4.87 (101%), DLCO 69%, +BD from FEF 25-75%. RAST 10/26/17 >> negative, IgE 108 Spirometry 08/09/19 >> FEV1 1.08 (64%), FEV1% 60  Chest Imaging:  CT chest 01/16/11 >> Multifocal b/l ASD with GGO CT chest 07/22/11 >> resolution of ASD CT chest 10/03/11 >> recurrence of nodular infiltrate Rt upper lobe  Social History:  She  reports that she quit smoking about 53 years ago. Her smoking use included cigarettes. She has a 15.00 pack-year smoking history. She has never used smokeless tobacco. She reports that she does not drink alcohol and does not use drugs.  Family History:  Her family history includes Alzheimer's disease in her mother; Arthritis in her father; Cancer in her son; Colitis in her mother; Diabetes in her brother and paternal grandmother; Heart attack in her paternal grandmother; Hypertension in her brother, father, and mother; Irritable bowel syndrome in her mother; Non-Hodgkin's lymphoma in her brother; Other in her son; Pulmonary embolism in her father; Rheum arthritis in her father.     Assessment/Plan:  Viral upper respiratory tract infection. - seems to be improving - don't think she needs a chest xray or antibiotics at this time - discussed symptoms to monitor for that would indicate the need for antibiotics - continue mucinex and albuterol as needed  Eosinophilic pneumonia. Allergic asthma with eosinophilic phenotype. - has been on nucala since  March 2019; this has helped reduce frequency of exacerbations and improve symptom status - continue nucula - prn albuterol   Allergic rhinitis. - continue nucala - prn flonase  Osteoarthritis. - f/u with PCP and rheumatology (Dr. 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 - followed by Dr. Kelton Pillar with endocrinology - maintained on 5 mg prednisone daily; likely helping with asthma   Time Spent Involved in Patient Care on Day of Examination:  35 minutes  Follow up:   Patient Instructions  Continue using mucinex and albuterol as needed to help with cough and chest congestion.  Call if your symptoms get worse, then we can send in an antibiotic.  Will have our pharmacy team work on getting renewal for [employer] financial assistance.  Follow up in 6 months.  Medication List:   Allergies as of 10/21/2022       Reactions   Other Rash, Shortness Of Breath   Sulfonamide Derivatives Shortness Of Breath, Rash   Oxycodone-aspirin Other (See Comments)   Couldn't hear    Diclofenac Other (See Comments)   Unknown reaction  Other reaction(s): Trouble Breathing/Hives   Oxycodone    Other reaction(s): Trouble Breathing   Prolia [denosumab]    Muscle pain, bone pain   Rofecoxib Other (See Comments)   Unknown reaction  Other reaction(s): Hives/Trouble Breathing   Sulfa Antibiotics    Other reaction(s): Trouble Breathing   Ace Inhibitors Cough   Other reaction(s): Trouble Breathing/Hives Other reaction(s): Trouble Breathing/Hives   Benazepril Hcl Other (See Comments)   No PMH of angioedema; ACE-I caused cough   Tramadol Itching        Medication List        Accurate as of October 21, 2022  1:36 PM. If you have any questions, ask your nurse or doctor.          acetaminophen 325 MG tablet Commonly known as: TYLENOL Take 650 mg by mouth 3 (three) times a week.   albuterol 108 (90 Base) MCG/ACT inhaler Commonly known as: ProAir HFA Inhale 2  puffs into the lungs every 6 (six) hours as needed for wheezing or shortness of breath.   aspirin 81 MG chewable tablet Chew 1 tablet (81 mg total) by mouth 2 (two) times daily.   calcium carbonate 600 MG tablet Commonly known as: OS-CAL Take 600 mg by mouth 2 (two) times daily.   diltiazem 120 MG 24 hr capsule Commonly known as: CARDIZEM CD Take 1 capsule (120 mg total) by mouth daily.   gabapentin 300 MG capsule Commonly known as: NEURONTIN TAKE ONE CAPSULE BY MOUTH THREE TIMES DAILY   LORazepam 0.5 MG tablet Commonly known as: ATIVAN TAKE 1/2 TABLET BY MOUTH AT BEDTIME AS NEEDED IN THE EVENING AS NEEDED FOR ANXIETY   Nucala 100 MG/ML Soaj Generic drug: Mepolizumab Inject 1 mL (100 mg total) into the skin every 28 (twenty-eight) days. Deliver to patient's home for self-administration.   predniSONE 5 MG tablet Commonly known as: DELTASONE Take 1 tablet (5 mg total) by mouth daily with breakfast.   Pure Calcium Carbonate 1500 (600 Ca) MG Tabs tablet Generic drug: calcium carbonate Take 1 tablet  by mouth 2 (two) times daily.   sertraline 100 MG tablet Commonly known as: ZOLOFT TAKE TWO TABLETS BY MOUTH ONCE DAILY FOR ANXIETY   thiamine 100 MG tablet Commonly known as: Vitamin B-1 Take 100 mg by mouth daily as needed.   Vitamin D3 50 MCG (2000 UT) capsule Take 2,000 Units by mouth daily.   zinc gluconate 50 MG tablet Take 50 mg by mouth daily.        Signature:  [name], MD [place] Pager - [phone] 10/21/2022, 1:36 PM

## 2022-10-21 NOTE — Patient Instructions (Signed)
Continue using mucinex and albuterol as needed to help with cough and chest congestion.  Call if your symptoms get worse, then we can send in an antibiotic.  Will have our pharmacy team work on getting renewal for [employer] financial assistance.  Follow up in 6 months.

## 2022-10-23 ENCOUNTER — Telehealth: Payer: Self-pay | Admitting: Pulmonary Disease

## 2022-10-23 MED ORDER — AZITHROMYCIN 250 MG PO TABS: ORAL_TABLET | ORAL | 0 refills | Status: DC

## 2022-10-23 NOTE — Telephone Encounter (Signed)
Called and spoke with patient. She stated that she was seen by Dr. Halford Chessman on 10/24 and was told to call back if she did not feel any better. She now has a productive cough with thick green phlegm and increased SOB. Denied any fevers or body aches. She wanted to know if she could have an abx sent in.   She also mentioned that she is supposed to restart her [place] tomorrow. She wants to know if she needs to postpone this until she is feeling better.   Pharmacy is [facility].   TP, can you please advise since VS is not available? Thanks!

## 2022-10-23 NOTE — Telephone Encounter (Signed)
ZPack # 1 take as directed.  If not better will need ov  Please contact office for sooner follow up if symptoms do not improve or worsen or seek emergency care

## 2022-10-23 NOTE — Telephone Encounter (Signed)
Called and spoke with patient. She verbalized understanding. RX has been sent to the pharmacy.   Nothing further needed at time of call.

## 2022-10-27 ENCOUNTER — Telehealth: Payer: Self-pay | Admitting: Pulmonary Disease

## 2022-10-28 MED ORDER — AMOXICILLIN-POT CLAVULANATE 875-125 MG PO TABS: 1.0000 | ORAL_TABLET | Freq: Two times a day (BID) | ORAL | 0 refills | Status: DC

## 2022-10-28 NOTE — Telephone Encounter (Signed)
Did she test for Covid ? If not would home test , call back if positive .  Augmentin '875mg'$  Twice daily  for 7 days #14 . Take with food  Would eat yogurt.  If not better needs ov  Please contact office for sooner follow up if symptoms do not improve or worsen or seek emergency care

## 2022-10-28 NOTE — Telephone Encounter (Signed)
I called and spoke with the pt  She states that she finished Zpack and is still coughing up green sputum  She is also blowing green nasal d/c  Denies any fevers, aches, wheezing, SOB or other co's  I advised needs appt since not improving after phone medicine called in for her on 10/24/22  She refused, stating that she was just here seeing Dr Halford Chessman on 10/21/22 and does not feel like she should need to come in  I tried explaining to her that per last phone note, would need to be seen if not improving  She still declined scheduling appt  She wants augmentin called in bc she says that this was given to her by [facility] in the past and it helped a lot  She is taking albuterol and mucinex  Please advise, thanks!  Allergies  Allergen Reactions   Other Rash and Shortness Of Breath   Sulfonamide Derivatives Shortness Of Breath and Rash   Oxycodone-Aspirin Other (See Comments)    Couldn't hear    Diclofenac Other (See Comments)    Unknown reaction  Other reaction(s): Trouble Breathing/Hives   Oxycodone     Other reaction(s): Trouble Breathing   Prolia [Denosumab]     Muscle pain, bone pain   Rofecoxib Other (See Comments)    Unknown reaction  Other reaction(s): Hives/Trouble Breathing   Sulfa Antibiotics     Other reaction(s): Trouble Breathing   Ace Inhibitors Cough    Other reaction(s): Trouble Breathing/Hives Other reaction(s): Trouble Breathing/Hives   Benazepril Hcl Other (See Comments)    No PMH of angioedema; ACE-I caused cough   Tramadol Itching

## 2022-10-28 NOTE — Telephone Encounter (Signed)
Called and spoke with patient. She stated that she has not taken a covid test because "she does not believe covid exists". She verbalized understanding for RX.   Nothing further needed.

## 2022-10-29 ENCOUNTER — Other Ambulatory Visit: Payer: Self-pay | Admitting: Internal Medicine

## 2022-11-03 ENCOUNTER — Other Ambulatory Visit: Payer: Self-pay | Admitting: Internal Medicine

## 2022-11-04 DIAGNOSIS — H11821 Conjunctivochalasis, right eye: Secondary | ICD-10-CM | POA: Diagnosis not present

## 2022-11-05 NOTE — Progress Notes (Unsigned)
Subjective:    Patient ID: Lisa Larson, female    DOB: [birth year], 81 y.o.   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  Her daughter does not think she is taking enough ativan - she is only taking it at night.  She gets shaky during the day.  She is very anxious and stressed out.  She was on a higher dose in rehab and it was better.    Memory is worse per daughter and she agrees.  Can not focus on anything - it is like severe ADD per daughter.   Not drinking any alcohol.   She is mobile at the house. She uses a walker.  Wheelchair used for doctors visits.  Eats a lot sweets.  Drinks a lot of water.      Medications and allergies reviewed with patient and updated if appropriate.  Current Outpatient Medications on File Prior to Visit  Medication Sig Dispense Refill   acetaminophen (TYLENOL) 325 MG tablet Take 650 mg by mouth 3 (three) times a week.     albuterol (PROAIR HFA) 108 (90 Base) MCG/ACT inhaler Inhale 2 puffs into the lungs every 6 (six) hours as needed for wheezing or shortness of breath. 8 g 5   aspirin 81 MG chewable tablet Chew 1 tablet (81 mg total) by mouth 2 (two) times daily. 60 tablet 0   calcium carbonate (OS-CAL) 600 MG tablet Take 600 mg by mouth 2 (two) times daily.     Cholecalciferol (VITAMIN D3) 2000 UNITS capsule Take 2,000 Units by mouth daily.     diltiazem (CARDIZEM CD) 120 MG 24 hr capsule Take 1 capsule (120 mg total) by mouth daily. 90 capsule 3   gabapentin (NEURONTIN) 300 MG capsule TAKE ONE CAPSULE BY MOUTH THREE TIMES DAILY 90 capsule 0   LORazepam (ATIVAN) 0.5 MG tablet TAKE 1/2 TABLET BY MOUTH AT BEDTIME AS NEEDED IN THE EVENING AS NEEDED FOR ANXIETY 15 tablet 2   Mepolizumab (NUCALA) 100 MG/ML SOAJ Inject 1 mL  (100 mg total) into the skin every 28 (twenty-eight) days. Deliver to patient's home for self-administration. 1 mL 5   predniSONE (DELTASONE) 5 MG tablet Take 1 tablet (5 mg total) by mouth daily with breakfast. 30 tablet 3   PURE CALCIUM CARBONATE 1500 (600 Ca) MG TABS tablet Take 1 tablet by mouth 2 (two) times daily.     sertraline (ZOLOFT) 100 MG tablet TAKE TWO TABLETS BY MOUTH ONCE DAILY FOR ANXIETY 60 tablet 0   No current facility-administered medications on file prior to visit.     Review of Systems  Constitutional:  Negative for chills and fever.  Respiratory:  Negative for cough, shortness of breath and wheezing.   Cardiovascular:  Negative for chest pain, palpitations and leg swelling.  Gastrointestinal:  Negative for abdominal pain, blood in stool, constipation, diarrhea and nausea.  Neurological:  Negative for light-headedness and headaches.       Objective:   Vitals:   11/06/22 0909  BP: 118/68  Pulse: 85  Temp: 98.1 F (36.7 C)  SpO2: 98%   BP Readings from Last 3 Encounters:  11/06/22 118/68  10/21/22 118/76  05/28/22 116/60   Wt Readings from Last 3 Encounters:  11/06/22 134 lb (  60.8 kg)  10/21/22 137 lb 3.2 oz (62.2 kg)  05/23/22 147 lb (66.7 kg)   Body mass index is 25.32 kg/m.    Physical Exam Constitutional:      General: She is not in acute distress.    Appearance: Normal appearance.  HENT:     Head: Normocephalic and atraumatic.  Eyes:     Conjunctiva/sclera: Conjunctivae normal.  Cardiovascular:     Rate and Rhythm: Normal rate. Rhythm irregular.     Heart sounds: Normal heart sounds. No murmur heard. Pulmonary:     Effort: Pulmonary effort is normal. No respiratory distress.     Breath sounds: Normal breath sounds. No wheezing.  Musculoskeletal:     Cervical back: Neck supple.     Right lower leg: No edema.     Left lower leg: No edema.  Lymphadenopathy:     Cervical: No cervical adenopathy.  Skin:    General: Skin is warm and  dry.     Findings: No rash.  Neurological:     Mental Status: She is alert. Mental status is at baseline.  Psychiatric:        Mood and Affect: Mood normal.        Behavior: Behavior normal.        Lab Results  Component Value Date   WBC 8.1 05/28/2022   HGB 13.4 05/28/2022   HCT 41.2 05/28/2022   PLT 194 05/28/2022   GLUCOSE 97 05/28/2022   CHOL 186 05/25/2020   TRIG 142.0 05/25/2020   HDL 67.60 05/25/2020   LDLDIRECT 121.0 11/16/2018   LDLCALC 90 05/25/2020   ALT 21 03/03/2022   AST 32 03/03/2022   NA 140 05/28/2022   K 4.3 05/28/2022   CL 103 05/28/2022   CREATININE 0.83 05/28/2022   BUN 20 05/28/2022   CO2 21 05/28/2022   TSH 1.439 03/03/2022   INR 1.0 03/03/2022   HGBA1C 5.3 05/25/2020     Assessment & Plan:    See Problem List for Assessment and Plan of chronic medical problems.

## 2022-11-06 ENCOUNTER — Ambulatory Visit (INDEPENDENT_AMBULATORY_CARE_PROVIDER_SITE_OTHER): Payer: Medicare Other | Admitting: Internal Medicine

## 2022-11-06 ENCOUNTER — Encounter: Payer: Self-pay | Admitting: Internal Medicine

## 2022-11-06 VITALS — BP 118/68 | HR 85 | Temp 98.1°F | Ht 61.0 in | Wt 134.0 lb

## 2022-11-06 DIAGNOSIS — D649 Anemia, unspecified: Secondary | ICD-10-CM | POA: Diagnosis not present

## 2022-11-06 DIAGNOSIS — E2749 Other adrenocortical insufficiency: Secondary | ICD-10-CM

## 2022-11-06 DIAGNOSIS — F419 Anxiety disorder, unspecified: Secondary | ICD-10-CM

## 2022-11-06 DIAGNOSIS — F411 Generalized anxiety disorder: Secondary | ICD-10-CM | POA: Diagnosis not present

## 2022-11-06 DIAGNOSIS — I4891 Unspecified atrial fibrillation: Secondary | ICD-10-CM

## 2022-11-06 DIAGNOSIS — R413 Other amnesia: Secondary | ICD-10-CM

## 2022-11-06 DIAGNOSIS — M549 Dorsalgia, unspecified: Secondary | ICD-10-CM

## 2022-11-06 DIAGNOSIS — R739 Hyperglycemia, unspecified: Secondary | ICD-10-CM

## 2022-11-06 DIAGNOSIS — I10 Essential (primary) hypertension: Secondary | ICD-10-CM | POA: Diagnosis not present

## 2022-11-06 DIAGNOSIS — F3289 Other specified depressive episodes: Secondary | ICD-10-CM

## 2022-11-06 LAB — CBC WITH DIFFERENTIAL/PLATELET
Basophils Absolute: 0 10*3/uL (ref 0.0–0.1)
Basophils Relative: 0.2 % (ref 0.0–3.0)
Eosinophils Absolute: 0 10*3/uL (ref 0.0–0.7)
Eosinophils Relative: 0.3 % (ref 0.0–5.0)
HCT: 39.9 % (ref 36.0–46.0)
Hemoglobin: 13.1 g/dL (ref 12.0–15.0)
Lymphocytes Relative: 10 % — ABNORMAL LOW (ref 12.0–46.0)
Lymphs Abs: 1.1 10*3/uL (ref 0.7–4.0)
MCHC: 32.8 g/dL (ref 30.0–36.0)
MCV: 92.5 fl (ref 78.0–100.0)
Monocytes Absolute: 0.8 10*3/uL (ref 0.1–1.0)
Monocytes Relative: 7 % (ref 3.0–12.0)
Neutro Abs: 9 10*3/uL — ABNORMAL HIGH (ref 1.4–7.7)
Neutrophils Relative %: 82.5 % — ABNORMAL HIGH (ref 43.0–77.0)
Platelets: 215 10*3/uL (ref 150.0–400.0)
RBC: 4.32 Mil/uL (ref 3.87–5.11)
RDW: 15.3 % (ref 11.5–15.5)
WBC: 10.9 10*3/uL — ABNORMAL HIGH (ref 4.0–10.5)

## 2022-11-06 LAB — IBC PANEL
Iron: 110 ug/dL (ref 42–145)
Saturation Ratios: 42.9 % (ref 20.0–50.0)
TIBC: 256.2 ug/dL (ref 250.0–450.0)
Transferrin: 183 mg/dL — ABNORMAL LOW (ref 212.0–360.0)

## 2022-11-06 LAB — VITAMIN B12: Vitamin B-12: 1015 pg/mL — ABNORMAL HIGH (ref 211–911)

## 2022-11-06 LAB — HEMOGLOBIN A1C: Hgb A1c MFr Bld: 5.8 % (ref 4.6–6.5)

## 2022-11-06 LAB — COMPREHENSIVE METABOLIC PANEL
ALT: 12 U/L (ref 0–35)
AST: 16 U/L (ref 0–37)
Albumin: 4.1 g/dL (ref 3.5–5.2)
Alkaline Phosphatase: 46 U/L (ref 39–117)
BUN: 24 mg/dL — ABNORMAL HIGH (ref 6–23)
CO2: 32 mEq/L (ref 19–32)
Calcium: 10 mg/dL (ref 8.4–10.5)
Chloride: 104 mEq/L (ref 96–112)
Creatinine, Ser: 0.85 mg/dL (ref 0.40–1.20)
GFR: 65.11 mL/min (ref >60.00)
Glucose, Bld: 84 mg/dL (ref 70–99)
Potassium: 4.4 mEq/L (ref 3.5–5.1)
Sodium: 141 mEq/L (ref 135–145)
Total Bilirubin: 0.3 mg/dL (ref 0.2–1.2)
Total Protein: 7.1 g/dL (ref 6.0–8.3)

## 2022-11-06 LAB — LIPID PANEL
Cholesterol: 149 mg/dL (ref 0–200)
HDL: 57.3 mg/dL (ref >39.00)
LDL Cholesterol: 69 mg/dL (ref 0–99)
NonHDL: 92.07
Total CHOL/HDL Ratio: 3
Triglycerides: 114 mg/dL (ref 0.0–149.0)
VLDL: 22.8 mg/dL (ref 0.0–40.0)

## 2022-11-06 LAB — FERRITIN: Ferritin: 559.6 ng/mL — ABNORMAL HIGH (ref 10.0–291.0)

## 2022-11-06 LAB — TSH: TSH: 1.36 u[IU]/mL (ref 0.35–5.50)

## 2022-11-06 MED ORDER — LORAZEPAM 0.5 MG PO TABS: 0.2500 mg | ORAL_TABLET | Freq: Two times a day (BID) | ORAL | 5 refills | Status: DC

## 2022-11-06 NOTE — Assessment & Plan Note (Signed)
Chronic Controlled, Stable Continue sertraline 200 milligram daily

## 2022-11-06 NOTE — Assessment & Plan Note (Signed)
Chronic Blood pressure well controlled CMP Continue diltiazem 120 mg daily

## 2022-11-06 NOTE — Assessment & Plan Note (Addendum)
Chronic Not controlled She is very anxious and wonders what can be done Discussed options of tapering off the sertraline and trying something different Adding buspirone She does want to try increasing the lorazepam-when she was in rehab earlier this year she was on a higher dose and that worked better-discussed this does have side effects-increased risk of falls, drowsiness, confusion and memory concerns and may not work long-term Continue sertraline 200 mg daily-she will think about this to something different Currently taking lorazepam 0.25 mg at bedtime-we will add 0.25 or 0.12 5 in the morning to see if that helps She will think about trying BuSpar

## 2022-11-06 NOTE — Assessment & Plan Note (Signed)
Chronic Continue gabapentin 300 mg 3 times daily

## 2022-11-06 NOTE — Assessment & Plan Note (Signed)
Chronic Check a1c Low sugar / carb diet Stressed regular exercise  

## 2022-11-06 NOTE — Assessment & Plan Note (Signed)
Chronic On Prednisone 5 mg daily Managed by Dr. Kelton Pillar

## 2022-11-06 NOTE — Assessment & Plan Note (Signed)
Chronic Following with cardiology On diltiazem 120 mg daily, aspirin 81 mg daily CBC, CMP

## 2022-11-06 NOTE — Patient Instructions (Addendum)
      Blood work was ordered.   The lab is on the first floor.    Medications changes include :   stop iron , thiamine and zinc, increase ativan to 0.25 mg twice a day.       Return in about 6 months (around 05/07/2023) for follow up.  Sooner if needed.

## 2022-11-06 NOTE — Assessment & Plan Note (Addendum)
Having some memory difficulties Daughter wonders if pain is involved Discussed that her anxiety is likely contributing to some degree At risk for vascular dementia given A-fib and only anticoagulation she will take his aspirin 81 mg daily Discussed neurology referral-she will think about this Check B12, tsh

## 2022-11-13 ENCOUNTER — Encounter: Payer: Self-pay | Admitting: Internal Medicine

## 2022-11-17 ENCOUNTER — Telehealth: Payer: Self-pay | Admitting: Pharmacist

## 2022-11-17 DIAGNOSIS — J4550 Severe persistent asthma, uncomplicated: Secondary | ICD-10-CM

## 2022-11-17 MED ORDER — NUCALA 100 MG/ML ~~LOC~~ SOAJ: 100.0000 mg | SUBCUTANEOUS | 5 refills | Status: DC

## 2022-11-17 NOTE — Telephone Encounter (Signed)
Refill sent for NUCALA to [facility] via fax  Fax: [phone]  Dose: 100 mg SQ every 4 weeks  Last OV: 10/21/22 Provider: Dr. Halford Chessman  Next OV: 6 months (April 2024)  Knox Saliva, PharmD, MPH, BCPS Clinical Pharmacist (Rheumatology and Pulmonology)

## 2022-11-18 NOTE — Telephone Encounter (Signed)
Called and spoke to patient and told her that her refill request for Nucala was sent in by [facility] last night. And she verbalized understanding. She was thinking it came from her local pharmacy but I did tell her it comes from a speciality pharmacy. Nothing further needed

## 2022-11-19 ENCOUNTER — Telehealth: Payer: Self-pay | Admitting: Pharmacist

## 2022-11-19 ENCOUNTER — Telehealth: Payer: Self-pay | Admitting: Pulmonary Disease

## 2022-11-19 NOTE — Telephone Encounter (Signed)
Patient requested her portion of Nucala PAP renewal be mailed to her home. Mailed to home today.  Provider portion placed in Dr. Juanetta Gosling mailbox.  Knox Saliva, PharmD, MPH, BCPS, CPP Clinical Pharmacist (Rheumatology and Pulmonology)

## 2022-11-19 NOTE — Telephone Encounter (Signed)
See encounter from 11/21. Pt is VERY confused about where her Geradine Girt is coming from. States she used to get it from [facility] in [place], and doesnt understand why things have changed. She spoke with April yesterday where the message says she verbalized understanding, but that is not the case. She would like to know what pharmacy she needs to contact, their information and how to order her Nucala. Please call pt and go over all the specifics with her.

## 2022-11-19 NOTE — Telephone Encounter (Signed)
Returned call to patient and advised that rx for Nucala is filled by PAP which is through [facility].   She will reach out to [place] to schedle shipment to home on Monday, 11/24/22  Knox Saliva, PharmD, MPH, BCPS, CPP Clinical Pharmacist (Rheumatology and Pulmonology)

## 2022-11-21 ENCOUNTER — Telehealth: Payer: Self-pay | Admitting: Pulmonary Disease

## 2022-11-21 NOTE — Telephone Encounter (Signed)
ATC LVMTCB x 1  

## 2022-11-24 ENCOUNTER — Telehealth: Payer: Self-pay | Admitting: Pulmonary Disease

## 2022-11-24 NOTE — Telephone Encounter (Signed)
Called [facility] regarding Nucala rx.  Per rep, pharmacy was missing formulation although it is clearly written on form. Spoke with PharmD, Renaldo Reel K. She states she can see the formulation on the form when it's zoomed in. I did inquire why provider's office was not reached out to for clarification. She apologized. Told her that patient is overdue by > 1 week for Nucala dose and requested rx be expedited.  Called patient back and advised of this.  Phone: [phone] Order ref # [id number]  Knox Saliva, PharmD, MPH, BCPS, CPP Clinical Pharmacist (Rheumatology and Pulmonology)

## 2022-11-24 NOTE — Telephone Encounter (Signed)
Returned call to pt, spoke with her daughter Lisa Larson). Stated that [name] had reached out earlier this morning asking for a new Rx to be sent in to her PAP. Informed her that [facility] had actually sent that refill in on 11/20 and had discussed this with her mother on 11/22, directing her to contact [facility] in order to get the medication refilled.   I advised [name] to contact [facility] if her mother had not already done so, and to please reach back out to [place] if she had any additional issues or questions. She verbalized understanding understanding to all, nothing further should be needed at this time.

## 2022-11-26 ENCOUNTER — Ambulatory Visit: Payer: Medicare Other | Admitting: Internal Medicine

## 2022-11-27 ENCOUNTER — Ambulatory Visit: Payer: Medicare Other | Attending: Cardiology | Admitting: Cardiology

## 2022-11-27 ENCOUNTER — Encounter: Payer: Self-pay | Admitting: Cardiology

## 2022-11-27 VITALS — BP 118/80 | HR 110 | Ht 61.0 in | Wt 134.0 lb

## 2022-11-27 DIAGNOSIS — I10 Essential (primary) hypertension: Secondary | ICD-10-CM | POA: Diagnosis not present

## 2022-11-27 DIAGNOSIS — I480 Paroxysmal atrial fibrillation: Secondary | ICD-10-CM | POA: Diagnosis not present
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
## 2022-11-27 NOTE — Patient Instructions (Signed)
Medication Instructions:  Your physician recommends that you continue on your current medications as directed. Please refer to the Current Medication list given to you today.  *If you need a refill on your cardiac medications before your next appointment, please call your pharmacy*   Lab Work: NONE If you have labs (blood work) drawn today and your tests are completely normal, you will receive your results only by: MyChart Message (if you have MyChart) OR A paper copy in the mail If you have any lab test that is abnormal or we need to change your treatment, we will call you to review the results.   Testing/Procedures: NONE   Follow-Up: At [facility], you and your health needs are our priority.  As part of our continuing mission to provide you with exceptional heart care, we have created designated Provider Care Teams.  These Care Teams include your primary Cardiologist (physician) and Advanced Practice Providers (APPs -  Physician Assistants and Nurse Practitioners) who all work together to provide you with the care you need, when you need it.  We recommend signing up for the patient portal called "MyChart".  Sign up information is provided on this After Visit Summary.  MyChart is used to connect with patients for Virtual Visits (Telemedicine).  Patients are able to view lab/test results, encounter notes, upcoming appointments, etc.  Non-urgent messages can be sent to your provider as well.   To learn more about what you can do with MyChart, go to [email or web address].    Your next appointment:   6 month(s)  The format for your next appointment:   In Person  Provider:   Kardie Tobb, DO   

## 2022-12-01 ENCOUNTER — Other Ambulatory Visit: Payer: Self-pay | Admitting: Internal Medicine

## 2022-12-03 ENCOUNTER — Other Ambulatory Visit (HOSPITAL_COMMUNITY): Payer: Self-pay

## 2022-12-03 NOTE — Telephone Encounter (Signed)
Submitted Patient Assistance Application to [employer] [facility] [place] for [place] along with provider portion, PA and income documents. Will update patient when we receive a response.  Fax# [phone] Phone# [phone]

## 2022-12-24 ENCOUNTER — Telehealth: Payer: Self-pay | Admitting: Internal Medicine

## 2022-12-24 NOTE — Telephone Encounter (Signed)
Caller & Relationship to patient:Self   Call back number:[phone]   Date of last office visit:   Date of next office visit:  05/01/2023   Medication(s) to be refilled:  Sertaline 100 mg and gabapentin 300 mg.        Preferred Pharmacy: [facility]

## 2022-12-25 ENCOUNTER — Other Ambulatory Visit: Payer: Self-pay | Admitting: Internal Medicine

## 2022-12-25 NOTE — Telephone Encounter (Signed)
Meds faxed in today

## 2023-01-01 ENCOUNTER — Other Ambulatory Visit: Payer: Self-pay | Admitting: Internal Medicine

## 2023-01-01 DIAGNOSIS — E2749 Other adrenocortical insufficiency: Secondary | ICD-10-CM

## 2023-01-06 ENCOUNTER — Telehealth: Payer: Self-pay | Admitting: Pulmonary Disease

## 2023-01-07 NOTE — Telephone Encounter (Signed)
Returned call to patient and advised of N[place]ala PAP approval for 2024. She will plan to call ocmpany to schedule med refill shipment  Knox Saliva, PharmD, MPH, BCPS, CPP Clinical Pharmacist (Rheumatology and Pulmonology)

## 2023-01-07 NOTE — Telephone Encounter (Addendum)
Called [employer] [facility] [place] for PAP renewal update. Received a verbal from  [employer] [facility] [place] regarding an approval for NUCALA patient assistance from 01/06/2023 to 01/08/2024. Rep will refax approval letter. Provided her with correct fax number. Called patient to advise her to schedule refill as she is due on 01/13/2023. She verbalized understanding  Phone: [phone]  [name], PharmD, MPH, BCPS, CPP Clinical Pharmacist (Rheumatology and Pulmonology)

## 2023-01-13 ENCOUNTER — Encounter: Payer: Self-pay | Admitting: Internal Medicine

## 2023-01-13 ENCOUNTER — Ambulatory Visit (INDEPENDENT_AMBULATORY_CARE_PROVIDER_SITE_OTHER): Payer: Medicare Other | Admitting: Internal Medicine

## 2023-01-13 DIAGNOSIS — E2749 Other adrenocortical insufficiency: Secondary | ICD-10-CM | POA: Diagnosis not present

## 2023-01-13 MED ORDER — PREDNISONE 5 MG PO TABS: 5.0000 mg | ORAL_TABLET | Freq: Every day | ORAL | 3 refills | Status: DC

## 2023-01-13 NOTE — Patient Instructions (Signed)
 -   Continue prednisone 5 mg 1 tablet daily with breakfast  ADRENAL INSUFFICIENCY SICK DAY RULES:  Should you face an extreme emotional or physical stress such as trauma, surgery or acute illness, this will require extra steroid coverage so that the body can meet that stress.   Without increasing the steroid dose you may experience severe weakness, headache, dizziness, nausea and vomiting and possibly a more serious deterioration in health.  Typically the dose of steroids will only need to be increased for a couple of days if you have an illness that is transient and managed in the community.   If you are unable to take/absorb an increased dose of steroids orally because of vomiting or diarrhea, you will urgently require steroid injections and should present to an Emergency Department.  The general advice for any serious illness is as follows: Double the normal daily steroid dose for up to 3 days if you have a temperature of more than 37.50C (99.65F) with signs of sickness, or severe emotional or physical distress Contact your primary care doctor and Endocrinologist if the illness worsens or it lasts for more than 3 days.  In cases of severe illness, urgent medical assistance should be promptly sought. If you experience vomiting/diarrhea or are unable to take steroids by mouth, please administer the Hydrocortisone injection kit and seek urgent medical help.
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
## 2023-01-17 ENCOUNTER — Other Ambulatory Visit: Payer: Self-pay | Admitting: Internal Medicine

## 2023-01-19 ENCOUNTER — Other Ambulatory Visit: Payer: Self-pay

## 2023-01-20 ENCOUNTER — Telehealth: Payer: Self-pay | Admitting: Internal Medicine

## 2023-01-20 NOTE — Telephone Encounter (Signed)
Patient think that she has a UTI - she would like to have one of her children pick up a kit so her urine can be tested.  Please call patient and let her know if this can be done.  [name]:  [phone]

## 2023-01-21 ENCOUNTER — Other Ambulatory Visit: Payer: Self-pay

## 2023-01-21 DIAGNOSIS — R300 Dysuria: Secondary | ICD-10-CM

## 2023-01-21 NOTE — Telephone Encounter (Signed)
Samples and hat placed up front for pick up.  Called and spoke with patient

## 2023-01-22 ENCOUNTER — Other Ambulatory Visit (INDEPENDENT_AMBULATORY_CARE_PROVIDER_SITE_OTHER): Payer: Medicare Other

## 2023-01-22 DIAGNOSIS — R300 Dysuria: Secondary | ICD-10-CM

## 2023-01-22 LAB — URINALYSIS, ROUTINE W REFLEX MICROSCOPIC
Bilirubin Urine: NEGATIVE
Hgb urine dipstick: NEGATIVE
Ketones, ur: NEGATIVE
Leukocytes,Ua: NEGATIVE
Nitrite: NEGATIVE
RBC / HPF: NONE SEEN (ref 0–?)
Specific Gravity, Urine: 1.015 (ref 1.000–1.030)
Total Protein, Urine: NEGATIVE
Urine Glucose: NEGATIVE
Urobilinogen, UA: 0.2 (ref 0.0–1.0)
pH: 7.5 (ref 5.0–8.0)

## 2023-01-24 LAB — CULTURE, URINE COMPREHENSIVE: RESULT:: NO GROWTH

## 2023-02-09 ENCOUNTER — Ambulatory Visit: Payer: Medicare Other | Admitting: Internal Medicine

## 2023-02-09 ENCOUNTER — Other Ambulatory Visit: Payer: Self-pay | Admitting: Internal Medicine

## 2023-02-15 ENCOUNTER — Other Ambulatory Visit: Payer: Self-pay | Admitting: Internal Medicine

## 2023-02-16 ENCOUNTER — Other Ambulatory Visit: Payer: Self-pay

## 2023-02-17 ENCOUNTER — Telehealth: Payer: Self-pay | Admitting: Cardiology
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
## 2023-02-18 ENCOUNTER — Ambulatory Visit: Payer: Medicare Other | Admitting: Orthopedic Surgery

## 2023-02-26 ENCOUNTER — Ambulatory Visit: Payer: Medicare Other | Admitting: Orthopedic Surgery

## 2023-03-02 ENCOUNTER — Ambulatory Visit (INDEPENDENT_AMBULATORY_CARE_PROVIDER_SITE_OTHER): Payer: Medicare Other | Admitting: Orthopedic Surgery

## 2023-03-02 DIAGNOSIS — M1611 Unilateral primary osteoarthritis, right hip: Secondary | ICD-10-CM | POA: Diagnosis not present

## 2023-03-02 DIAGNOSIS — M1711 Unilateral primary osteoarthritis, right knee: Secondary | ICD-10-CM | POA: Diagnosis not present

## 2023-03-02 DIAGNOSIS — M1712 Unilateral primary osteoarthritis, left knee: Secondary | ICD-10-CM

## 2023-03-03 ENCOUNTER — Encounter: Payer: Self-pay | Admitting: Orthopedic Surgery

## 2023-03-03 MED ORDER — METHYLPREDNISOLONE ACETATE 40 MG/ML IJ SUSP
40.0000 mg | INTRAMUSCULAR | Status: AC | PRN
  Administered 2023-03-02: 40 mg via INTRA_ARTICULAR

## 2023-03-03 MED ORDER — BUPIVACAINE HCL 0.25 % IJ SOLN
4.0000 mL | INTRAMUSCULAR | Status: AC | PRN
  Administered 2023-03-02: 4 mL via INTRA_ARTICULAR

## 2023-03-03 MED ORDER — LIDOCAINE HCL 1 % IJ SOLN
5.0000 mL | INTRAMUSCULAR | Status: AC | PRN
  Administered 2023-03-02: 5 mL
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
## 2023-03-08 ENCOUNTER — Other Ambulatory Visit: Payer: Self-pay | Admitting: Internal Medicine

## 2023-04-27 ENCOUNTER — Other Ambulatory Visit: Payer: Self-pay | Admitting: Internal Medicine

## 2023-04-27 ENCOUNTER — Telehealth: Payer: Self-pay

## 2023-04-27 ENCOUNTER — Telehealth: Payer: Self-pay | Admitting: Internal Medicine

## 2023-04-27 NOTE — Telephone Encounter (Signed)
Message addressed today

## 2023-04-27 NOTE — Telephone Encounter (Signed)
-----   Message from Pincus Sanes, MD sent at 04/27/2023  3:46 PM EDT ----- Regarding: Call her needs 6 month f/u in may Call her needs 6 month f/u in may

## 2023-04-27 NOTE — Telephone Encounter (Signed)
Please give pt a call about the urine sample best call back [phone]

## 2023-04-27 NOTE — Telephone Encounter (Signed)
Pt want to have her son come in and pick up a urine kit possible UTI please update pt when urine sample has been put in

## 2023-04-27 NOTE — Telephone Encounter (Signed)
Spoke with patient today and appointment offered.  Patient will have to call back to schedule for son to bring her.

## 2023-04-28 ENCOUNTER — Other Ambulatory Visit (INDEPENDENT_AMBULATORY_CARE_PROVIDER_SITE_OTHER): Payer: Medicare Other

## 2023-04-28 ENCOUNTER — Other Ambulatory Visit: Payer: Self-pay

## 2023-04-28 ENCOUNTER — Telehealth: Payer: Self-pay | Admitting: Internal Medicine

## 2023-04-28 DIAGNOSIS — R300 Dysuria: Secondary | ICD-10-CM

## 2023-04-28 LAB — URINALYSIS, ROUTINE W REFLEX MICROSCOPIC
Nitrite: NEGATIVE
Specific Gravity, Urine: 1.02 (ref 1.000–1.030)
Urine Glucose: NEGATIVE
Urobilinogen, UA: 0.2 (ref 0.0–1.0)
pH: 6 (ref 5.0–8.0)

## 2023-04-28 MED ORDER — CEPHALEXIN 500 MG PO CAPS: 500.0000 mg | ORAL_CAPSULE | Freq: Two times a day (BID) | ORAL | 0 refills | Status: DC

## 2023-04-28 NOTE — Telephone Encounter (Signed)
Spoke with patient yesterday.

## 2023-04-28 NOTE — Telephone Encounter (Signed)
error 

## 2023-04-28 NOTE — Telephone Encounter (Signed)
Antibiotic sent to pharmacy.  If culture is negative I will advise that she stop it

## 2023-04-28 NOTE — Telephone Encounter (Signed)
Patient would like a call back to go over urinalysis results. Please call back at [phone].

## 2023-04-29 NOTE — Telephone Encounter (Signed)
Spoke with patient today. 

## 2023-04-30 ENCOUNTER — Telehealth: Payer: Self-pay | Admitting: Internal Medicine

## 2023-04-30 NOTE — Telephone Encounter (Signed)
Spoke with patient today.  All questions answered regarding urine today and she understands we will call her once culture is back.

## 2023-04-30 NOTE — Telephone Encounter (Signed)
Pt wants a nurse to give her a call back because she wants to know why she is urinating a lot.

## 2023-05-01 ENCOUNTER — Telehealth: Payer: Self-pay | Admitting: Internal Medicine

## 2023-05-01 LAB — CULTURE, URINE COMPREHENSIVE

## 2023-05-01 NOTE — Telephone Encounter (Signed)
Patient called and said that her UTI is not getting better.  She wants another antibiotic called in.  I offered patient an appointment for today and she refused.  Please call patient at [phone]

## 2023-05-01 NOTE — Telephone Encounter (Signed)
Pt called stating she is still having the same issues I informed the pt antibiotic take a while to get in your system before start working...the patient  insisted to speak with you I told her you are currently busy I ask her if its anything I can help her with she said she wanted to ask carla a question about anabiotics.

## 2023-05-01 NOTE — Telephone Encounter (Signed)
Spoke with patient today.  Results given and she has been informed to finish out abx as instructed.

## 2023-05-06 ENCOUNTER — Ambulatory Visit: Payer: Medicare Other | Admitting: Internal Medicine

## 2023-05-07 NOTE — Progress Notes (Signed)
Subjective:    Patient ID: Lisa Larson, female    DOB: [birth year], 82 y.o.   MRN: [id number]     HPI Lisa Larson is here for follow up of her chronic medical problems.  Scalp psoriasis   Medications and allergies reviewed with patient and updated if appropriate.  Current Outpatient Medications on File Prior to Visit  Medication Sig Dispense Refill   acetaminophen (TYLENOL) 325 MG tablet Take 650 mg by mouth 3 (three) times a week.     albuterol (PROAIR HFA) 108 (90 Base) MCG/ACT inhaler Inhale 2 puffs into the lungs every 6 (six) hours as needed for wheezing or shortness of breath. 8 g 5   aspirin 81 MG chewable tablet Chew 1 tablet (81 mg total) by mouth 2 (two) times daily. 60 tablet 0   calcium carbonate (OS-CAL) 600 MG tablet Take 600 mg by mouth 2 (two) times daily.     chlorhexidine (PERIDEX) 0.12 % solution SMARTSIG:By Mouth     Cholecalciferol (VITAMIN D3) 2000 UNITS capsule Take 2,000 Units by mouth daily.     diltiazem (CARDIZEM CD) 120 MG 24 hr capsule Take 1 capsule (120 mg total) by mouth daily. 90 capsule 3   gabapentin (NEURONTIN) 300 MG capsule TAKE ONE CAPSULE BY MOUTH THREE TIMES DAILY 90 capsule 0   LORazepam (ATIVAN) 0.5 MG tablet Take 0.5 tablets (0.25 mg total) by mouth 2 (two) times daily. 30 tablet 5   Mepolizumab (NUCALA) 100 MG/ML SOAJ Inject 1 mL (100 mg total) into the skin every 28 (twenty-eight) days. Deliver to patient's home for self-administration. 1 mL 5   predniSONE (DELTASONE) 5 MG tablet Take 1 tablet (5 mg total) by mouth daily with breakfast. 100 tablet 3   PURE CALCIUM CARBONATE 1500 (600 Ca) MG TABS tablet Take 1 tablet by mouth 2 (two) times daily.     sertraline (ZOLOFT) 100 MG tablet TAKE TWO TABLETS BY MOUTH ONCE DAILY FOR ANXIETY 180 tablet 0   No current facility-administered medications on file prior to visit.     Review of Systems  Constitutional:  Negative for appetite change and fever.  Respiratory:  Negative for  cough, shortness of breath and wheezing.   Cardiovascular:  Negative for chest pain, palpitations and leg swelling.  Neurological:  Negative for light-headedness and headaches.       Objective:   Vitals:   05/08/23 1301  BP: 114/76  Pulse: 95  Temp: 98.3 F (36.8 C)  SpO2: 92%   BP Readings from Last 3 Encounters:  05/08/23 114/76  05/08/23 110/72  01/13/23 120/72   Wt Readings from Last 3 Encounters:  05/08/23 137 lb (62.1 kg)  05/08/23 137 lb 12.8 oz (62.5 kg)  01/13/23 135 lb 12.8 oz (61.6 kg)   Body mass index is 26.76 kg/m.    Physical Exam Constitutional:      General: She is not in acute distress.    Appearance: Normal appearance.  HENT:     Head: Normocephalic and atraumatic.  Eyes:     Conjunctiva/sclera: Conjunctivae normal.  Cardiovascular:     Rate and Rhythm: Normal rate and regular rhythm.     Heart sounds: Normal heart sounds.  Pulmonary:     Effort: Pulmonary effort is normal. No respiratory distress.     Breath sounds: Normal breath sounds. No wheezing.  Musculoskeletal:     Cervical back: Neck supple.     Right lower leg: No edema.     Left lower  leg: No edema.  Lymphadenopathy:     Cervical: No cervical adenopathy.  Skin:    General: Skin is warm and dry.     Findings: No rash.  Neurological:     Mental Status: She is alert. Mental status is at baseline.  Psychiatric:        Mood and Affect: Mood normal.        Behavior: Behavior normal.        Lab Results  Component Value Date   WBC 10.9 (H) 11/06/2022   HGB 13.1 11/06/2022   HCT 39.9 11/06/2022   PLT 215.0 11/06/2022   GLUCOSE 84 11/06/2022   CHOL 149 11/06/2022   TRIG 114.0 11/06/2022   HDL 57.30 11/06/2022   LDLDIRECT 121.0 11/16/2018   LDLCALC 69 11/06/2022   ALT 12 11/06/2022   AST 16 11/06/2022   NA 141 11/06/2022   K 4.4 11/06/2022   CL 104 11/06/2022   CREATININE 0.85 11/06/2022   BUN 24 (H) 11/06/2022   CO2 32 11/06/2022   TSH 1.36 11/06/2022   INR 1.0  03/03/2022   HGBA1C 5.8 11/06/2022     Assessment & Plan:    See Problem List for Assessment and Plan of chronic medical problems.

## 2023-05-07 NOTE — Patient Instructions (Addendum)
      Blood work was ordered.   The lab is on the first floor.    Medications changes include :   none      Return in about 6 months (around 11/08/2023) for follow up.

## 2023-05-08 ENCOUNTER — Ambulatory Visit: Payer: Medicare Other | Attending: Cardiology

## 2023-05-08 ENCOUNTER — Encounter: Payer: Self-pay | Admitting: Internal Medicine

## 2023-05-08 ENCOUNTER — Ambulatory Visit: Payer: Medicare Other | Attending: Cardiology | Admitting: Cardiology

## 2023-05-08 ENCOUNTER — Ambulatory Visit (INDEPENDENT_AMBULATORY_CARE_PROVIDER_SITE_OTHER): Payer: Medicare Other | Admitting: Internal Medicine

## 2023-05-08 VITALS — BP 114/76 | HR 95 | Temp 98.3°F | Ht 60.0 in | Wt 137.0 lb

## 2023-05-08 VITALS — BP 110/72 | HR 68 | Ht 60.0 in | Wt 137.8 lb

## 2023-05-08 DIAGNOSIS — M549 Dorsalgia, unspecified: Secondary | ICD-10-CM

## 2023-05-08 DIAGNOSIS — I4891 Unspecified atrial fibrillation: Secondary | ICD-10-CM

## 2023-05-08 DIAGNOSIS — I480 Paroxysmal atrial fibrillation: Secondary | ICD-10-CM | POA: Diagnosis not present

## 2023-05-08 DIAGNOSIS — F411 Generalized anxiety disorder: Secondary | ICD-10-CM

## 2023-05-08 DIAGNOSIS — L409 Psoriasis, unspecified: Secondary | ICD-10-CM

## 2023-05-08 DIAGNOSIS — I10 Essential (primary) hypertension: Secondary | ICD-10-CM

## 2023-05-08 DIAGNOSIS — E2749 Other adrenocortical insufficiency: Secondary | ICD-10-CM

## 2023-05-08 DIAGNOSIS — R413 Other amnesia: Secondary | ICD-10-CM

## 2023-05-08 DIAGNOSIS — R739 Hyperglycemia, unspecified: Secondary | ICD-10-CM | POA: Diagnosis not present

## 2023-05-08 DIAGNOSIS — F3289 Other specified depressive episodes: Secondary | ICD-10-CM | POA: Diagnosis not present

## 2023-05-08 DIAGNOSIS — M818 Other osteoporosis without current pathological fracture: Secondary | ICD-10-CM | POA: Diagnosis not present

## 2023-05-08 LAB — COMPREHENSIVE METABOLIC PANEL
ALT: 16 U/L (ref 0–35)
AST: 24 U/L (ref 0–37)
Albumin: 4.4 g/dL (ref 3.5–5.2)
Alkaline Phosphatase: 40 U/L (ref 39–117)
BUN: 21 mg/dL (ref 6–23)
CO2: 29 mEq/L (ref 19–32)
Calcium: 9.8 mg/dL (ref 8.4–10.5)
Chloride: 103 mEq/L (ref 96–112)
Creatinine, Ser: 0.95 mg/dL (ref 0.40–1.20)
GFR: 56.77 mL/min — ABNORMAL LOW (ref >60.00)
Glucose, Bld: 103 mg/dL — ABNORMAL HIGH (ref 70–99)
Potassium: 3.8 mEq/L (ref 3.5–5.1)
Sodium: 142 mEq/L (ref 135–145)
Total Bilirubin: 0.4 mg/dL (ref 0.2–1.2)
Total Protein: 7.2 g/dL (ref 6.0–8.3)

## 2023-05-08 LAB — CBC WITH DIFFERENTIAL/PLATELET
Basophils Absolute: 0 10*3/uL (ref 0.0–0.1)
Basophils Relative: 0.4 % (ref 0.0–3.0)
Eosinophils Absolute: 0 10*3/uL (ref 0.0–0.7)
Eosinophils Relative: 0.6 % (ref 0.0–5.0)
HCT: 41 % (ref 36.0–46.0)
Hemoglobin: 13.7 g/dL (ref 12.0–15.0)
Lymphocytes Relative: 12 % (ref 12.0–46.0)
Lymphs Abs: 1 10*3/uL (ref 0.7–4.0)
MCHC: 33.4 g/dL (ref 30.0–36.0)
MCV: 95 fl (ref 78.0–100.0)
Monocytes Absolute: 0.6 10*3/uL (ref 0.1–1.0)
Monocytes Relative: 7.2 % (ref 3.0–12.0)
Neutro Abs: 6.4 10*3/uL (ref 1.4–7.7)
Neutrophils Relative %: 79.8 % — ABNORMAL HIGH (ref 43.0–77.0)
Platelets: 188 10*3/uL (ref 150.0–400.0)
RBC: 4.32 Mil/uL (ref 3.87–5.11)
RDW: 14.1 % (ref 11.5–15.5)
WBC: 8 10*3/uL (ref 4.0–10.5)

## 2023-05-08 LAB — LIPID PANEL
Cholesterol: 168 mg/dL (ref 0–200)
HDL: 53.1 mg/dL (ref >39.00)
NonHDL: 115.22
Total CHOL/HDL Ratio: 3
Triglycerides: 236 mg/dL — ABNORMAL HIGH (ref 0.0–149.0)
VLDL: 47.2 mg/dL — ABNORMAL HIGH (ref 0.0–40.0)

## 2023-05-08 LAB — LDL CHOLESTEROL, DIRECT: Direct LDL: 79 mg/dL

## 2023-05-08 LAB — VITAMIN D 25 HYDROXY (VIT D DEFICIENCY, FRACTURES): VITD: 51.93 ng/mL (ref 30.00–100.00)

## 2023-05-08 LAB — HEMOGLOBIN A1C: Hgb A1c MFr Bld: 5.4 % (ref 4.6–6.5)

## 2023-05-08 LAB — TSH: TSH: 1.57 u[IU]/mL (ref 0.35–5.50)

## 2023-05-08 NOTE — Assessment & Plan Note (Signed)
Chronic Following with cardiology On diltiazem 120 mg daily, aspirin 81 mg daily CBC, CMP, TSH

## 2023-05-08 NOTE — Assessment & Plan Note (Signed)
Lisa Larson Has been on fosamax and prolia Dexa due - ordered Continue calcium and vit d Unable to exercise

## 2023-05-08 NOTE — Assessment & Plan Note (Signed)
Subacute Will try otc head and shoulders - if not effective - will let me know and I can send something in.

## 2023-05-08 NOTE — Progress Notes (Unsigned)
Enrolled patient for a 7 day Zio XT monitor to be mailed to patients home.  

## 2023-05-08 NOTE — Patient Instructions (Signed)
Medication Instructions:  Your physician recommends that you continue on your current medications as directed. Please refer to the Current Medication list given to you today.  *If you need a refill on your cardiac medications before your next appointment, please call your pharmacy*   Lab Work: None   Testing/Procedures: [place] Monitor Instructions  Your physician has requested you wear a ZIO patch monitor for 7 days.  This is a single patch monitor. [employer] supplies one patch monitor per enrollment. Additional stickers are not available. Please do not apply patch if you will be having a Nuclear Stress Test,  Echocardiogram, Cardiac CT, MRI, or Chest Xray during the period you would be wearing the  monitor. The patch cannot be worn during these tests. You cannot remove and re-apply the  ZIO XT patch monitor.  Your ZIO patch monitor will be mailed 3 day USPS to your address on file. It may take 3-5 days  to receive your monitor after you have been enrolled.  Once you have received your monitor, please review the enclosed instructions. Your monitor  has already been registered assigning a specific monitor serial # to you.  Billing and Patient Assistance Program Information  We have supplied [employer] with any of your insurance information on file for billing purposes. [employer] offers a sliding scale Patient Assistance Program for patients that do not have  insurance, or whose insurance does not completely cover the cost of the ZIO monitor.  You must apply for the Patient Assistance Program to qualify for this discounted rate.  To apply, please call [employer] at [phone], select option 4, select option 2, ask to apply for  Patient Assistance Program. Theodore Demark will ask your household income, and how many people  are in your household. They will quote your out-of-pocket cost based on that information.  [employer] will also be able to set up a 13-month, interest-free payment plan if  needed.  Applying the monitor   Shave hair from upper left chest.  Hold abrader disc by orange tab. Rub abrader in 40 strokes over the upper left chest as  indicated in your monitor instructions.  Clean area with 4 enclosed alcohol pads. Let dry.  Apply patch as indicated in monitor instructions. Patch will be placed under collarbone on left  side of chest with arrow pointing upward.  Rub patch adhesive wings for 2 minutes. Remove white label marked "1". Remove the white  label marked "2". Rub patch adhesive wings for 2 additional minutes.  While looking in a mirror, press and release button in center of patch. A small green light will  flash 3-4 times. This will be your only indicator that the monitor has been turned on.  Do not shower for the first 24 hours. You may shower after the first 24 hours.  Press the button if you feel a symptom. You will hear a small click. Record Date, Time and  Symptom in the Patient Logbook.  When you are ready to remove the patch, follow instructions on the last 2 pages of Patient  Logbook. Stick patch monitor onto the last page of Patient Logbook.  Place Patient Logbook in the blue and white box. Use locking tab on box and tape box closed  securely. The blue and white box has prepaid postage on it. Please place it in the mailbox as  soon as possible. Your physician should have your test results approximately 7 days after the  monitor has been mailed back to [facility]  Call [facility] Customer Care at [phone] if you have questions regarding  your ZIO XT patch monitor. Call them immediately if you see an orange light blinking on your  monitor.  If your monitor falls off in less than 4 days, contact our Monitor department at [phone].  If your monitor becomes loose or falls off after 4 days call [employer] at [phone] for  suggestions on securing your monitor    Follow-Up: At [facility], you and your health needs are  our priority.  As part of our continuing mission to provide you with exceptional heart care, we have created designated Provider Care Teams.  These Care Teams include your primary Cardiologist (physician) and Advanced Practice Providers (APPs -  Physician Assistants and Nurse Practitioners) who all work together to provide you with the care you need, when you need it.  Your next appointment:   4 month(s)  Provider:   Thomasene Ripple, DO     Other Instructions  Amiodarone

## 2023-05-08 NOTE — Assessment & Plan Note (Signed)
Chronic On Prednisone 5 mg daily Managed by Dr. Shamleffer 

## 2023-05-08 NOTE — Assessment & Plan Note (Signed)
Chronic Continue gabapentin 300 mg 3 times daily 

## 2023-05-08 NOTE — Assessment & Plan Note (Signed)
Chronic Blood pressure well controlled CMP Continue diltiazem 120 mg daily 
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
## 2023-05-08 NOTE — Assessment & Plan Note (Signed)
Chronic Controlled, Stable Continue sertraline 200 milligram daily 

## 2023-05-08 NOTE — Assessment & Plan Note (Signed)
Chronic Check a1c Low sugar / carb diet Stressed regular exercise  

## 2023-05-08 NOTE — Assessment & Plan Note (Signed)
Chronic Fairly controlled Continue sertraline 200 mg daily Continue lorazepam 0.25 mg twice daily

## 2023-05-17 DIAGNOSIS — I480 Paroxysmal atrial fibrillation: Secondary | ICD-10-CM

## 2023-05-21 ENCOUNTER — Other Ambulatory Visit: Payer: Self-pay | Admitting: Internal Medicine

## 2023-05-28 ENCOUNTER — Other Ambulatory Visit: Payer: Self-pay | Admitting: Internal Medicine

## 2023-05-28 ENCOUNTER — Other Ambulatory Visit: Payer: Self-pay | Admitting: Cardiology

## 2023-05-29 DIAGNOSIS — I480 Paroxysmal atrial fibrillation: Secondary | ICD-10-CM | POA: Diagnosis not present

## 2023-06-04 ENCOUNTER — Other Ambulatory Visit: Payer: Self-pay | Admitting: Internal Medicine

## 2023-06-04 DIAGNOSIS — F419 Anxiety disorder, unspecified: Secondary | ICD-10-CM

## 2023-06-10 ENCOUNTER — Telehealth: Payer: Self-pay | Admitting: Internal Medicine

## 2023-06-10 DIAGNOSIS — F419 Anxiety disorder, unspecified: Secondary | ICD-10-CM

## 2023-06-10 NOTE — Telephone Encounter (Signed)
Prescription Request  06/10/2023  LOV: 05/08/2023  What is the name of the medication or equipment? LORazepam (ATIVAN) 0.5 MG tablet   Have you contacted your pharmacy to request a refill? No   Which pharmacy would you like this sent to?     Patient notified that th [facility] [place], [place] - [address] [place] [address] Phone: [phone] Fax: [phone]  their request is being sent to the clinical staff for review and that they should receive a response within 2 business days.   Please advise at Mobile [phone] (mobile)

## 2023-06-11 MED ORDER — LORAZEPAM 0.5 MG PO TABS
0.2500 mg | ORAL_TABLET | Freq: Two times a day (BID) | ORAL | 3 refills | Status: DC
Start: 2023-06-11 — End: 2023-09-08

## 2023-06-27 ENCOUNTER — Other Ambulatory Visit: Payer: Self-pay | Admitting: Internal Medicine

## 2023-07-01 ENCOUNTER — Telehealth: Payer: Self-pay | Admitting: Internal Medicine

## 2023-07-01 NOTE — Telephone Encounter (Signed)
Patient called and said she gets a shot for her asthma. She said she is covered through the rest of this year, but wanted to know if she needs approval for next year. Patient would like a call back at [phone].

## 2023-07-03 ENCOUNTER — Telehealth: Payer: Self-pay | Admitting: Pulmonary Disease

## 2023-07-03 DIAGNOSIS — J4550 Severe persistent asthma, uncomplicated: Secondary | ICD-10-CM

## 2023-07-03 NOTE — Telephone Encounter (Signed)
Spoke with patient today.  She will follow up with Dr. Craige Cotta since it was regarding asthma medication.

## 2023-07-03 NOTE — Telephone Encounter (Signed)
Please advise 

## 2023-07-03 NOTE — Telephone Encounter (Signed)
Pt is looking for assistance on nucala.

## 2023-07-09 ENCOUNTER — Encounter (HOSPITAL_BASED_OUTPATIENT_CLINIC_OR_DEPARTMENT_OTHER): Payer: Self-pay | Admitting: Pulmonary Disease

## 2023-07-16 ENCOUNTER — Ambulatory Visit: Payer: Medicare Other | Admitting: Internal Medicine

## 2023-07-16 ENCOUNTER — Ambulatory Visit: Payer: Medicare Other

## 2023-07-16 ENCOUNTER — Encounter: Payer: Self-pay | Admitting: Internal Medicine

## 2023-07-16 VITALS — BP 126/78 | HR 64 | Temp 98.0°F | Ht 60.0 in | Wt 126.0 lb

## 2023-07-16 DIAGNOSIS — R300 Dysuria: Secondary | ICD-10-CM

## 2023-07-16 DIAGNOSIS — M545 Low back pain, unspecified: Secondary | ICD-10-CM

## 2023-07-16 DIAGNOSIS — J9811 Atelectasis: Secondary | ICD-10-CM | POA: Diagnosis not present

## 2023-07-16 DIAGNOSIS — R634 Abnormal weight loss: Secondary | ICD-10-CM | POA: Diagnosis not present

## 2023-07-16 DIAGNOSIS — M5136 Other intervertebral disc degeneration, lumbar region: Secondary | ICD-10-CM | POA: Diagnosis not present

## 2023-07-16 DIAGNOSIS — R443 Hallucinations, unspecified: Secondary | ICD-10-CM

## 2023-07-16 DIAGNOSIS — W19XXXA Unspecified fall, initial encounter: Secondary | ICD-10-CM | POA: Diagnosis not present

## 2023-07-16 DIAGNOSIS — I10 Essential (primary) hypertension: Secondary | ICD-10-CM

## 2023-07-16 DIAGNOSIS — R441 Visual hallucinations: Secondary | ICD-10-CM | POA: Insufficient documentation

## 2023-07-16 DIAGNOSIS — M533 Sacrococcygeal disorders, not elsewhere classified: Secondary | ICD-10-CM | POA: Diagnosis not present

## 2023-07-16 DIAGNOSIS — I517 Cardiomegaly: Secondary | ICD-10-CM | POA: Diagnosis not present

## 2023-07-16 DIAGNOSIS — I700 Atherosclerosis of aorta: Secondary | ICD-10-CM | POA: Diagnosis not present

## 2023-07-16 LAB — BASIC METABOLIC PANEL
BUN: 23 mg/dL (ref 6–23)
CO2: 25 mEq/L (ref 19–32)
Calcium: 10.4 mg/dL (ref 8.4–10.5)
Chloride: 103 mEq/L (ref 96–112)
Creatinine, Ser: 0.87 mg/dL (ref 0.40–1.20)
GFR: 63.01 mL/min (ref >60.00)
Glucose, Bld: 105 mg/dL — ABNORMAL HIGH (ref 70–99)
Potassium: 3.7 mEq/L (ref 3.5–5.1)
Sodium: 140 mEq/L (ref 135–145)

## 2023-07-16 LAB — CBC WITH DIFFERENTIAL/PLATELET
Basophils Absolute: 0 10*3/uL (ref 0.0–0.1)
Basophils Relative: 0.2 % (ref 0.0–3.0)
Eosinophils Absolute: 0.1 10*3/uL (ref 0.0–0.7)
Eosinophils Relative: 0.7 % (ref 0.0–5.0)
HCT: 44.9 % (ref 36.0–46.0)
Hemoglobin: 14.8 g/dL (ref 12.0–15.0)
Lymphocytes Relative: 10.8 % — ABNORMAL LOW (ref 12.0–46.0)
Lymphs Abs: 0.8 10*3/uL (ref 0.7–4.0)
MCHC: 33 g/dL (ref 30.0–36.0)
MCV: 94.9 fl (ref 78.0–100.0)
Monocytes Absolute: 0.5 10*3/uL (ref 0.1–1.0)
Monocytes Relative: 6.9 % (ref 3.0–12.0)
Neutro Abs: 6 10*3/uL (ref 1.4–7.7)
Neutrophils Relative %: 81.4 % — ABNORMAL HIGH (ref 43.0–77.0)
Platelets: 159 10*3/uL (ref 150.0–400.0)
RBC: 4.73 Mil/uL (ref 3.87–5.11)
RDW: 15.3 % (ref 11.5–15.5)
WBC: 7.4 10*3/uL (ref 4.0–10.5)

## 2023-07-16 LAB — URINALYSIS, ROUTINE W REFLEX MICROSCOPIC
Hgb urine dipstick: NEGATIVE
Nitrite: NEGATIVE
RBC / HPF: NONE SEEN (ref 0–?)
Specific Gravity, Urine: 1.025 (ref 1.000–1.030)
Urine Glucose: NEGATIVE
Urobilinogen, UA: 1 (ref 0.0–1.0)
pH: 5.5 (ref 5.0–8.0)

## 2023-07-16 LAB — HEPATIC FUNCTION PANEL
ALT: 18 U/L (ref 0–35)
AST: 40 U/L — ABNORMAL HIGH (ref 0–37)
Albumin: 4.5 g/dL (ref 3.5–5.2)
Alkaline Phosphatase: 35 U/L — ABNORMAL LOW (ref 39–117)
Bilirubin, Direct: 0.2 mg/dL (ref 0.0–0.3)
Total Bilirubin: 0.7 mg/dL (ref 0.2–1.2)
Total Protein: 7.3 g/dL (ref 6.0–8.3)

## 2023-07-16 LAB — CK: Total CK: 405 U/L — ABNORMAL HIGH (ref 7–177)

## 2023-07-16 LAB — MAGNESIUM: Magnesium: 1.7 mg/dL (ref 1.5–2.5)

## 2023-07-16 LAB — TSH: TSH: 2.32 u[IU]/mL (ref 0.35–5.50)
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
## 2023-07-16 NOTE — Patient Instructions (Addendum)
Please continue all other medications as before, and refills have been done if requested.  Please have the pharmacy call with any other refills you may need.  Please continue your efforts at being more active, low cholesterol diet, and weight control  Please keep your appointments with your specialists as you may have planned  Please go to the XRAY Department in the first floor for the x-ray testing  Please go to the LAB at the blood drawing area for the tests to be done  You will be contacted by phone if any changes need to be made immediately.  Otherwise, you will receive a letter about your results with an explanation, but please check with MyChart first.

## 2023-07-17 ENCOUNTER — Telehealth: Payer: Self-pay | Admitting: Internal Medicine

## 2023-07-17 LAB — URINE CULTURE: Result:: NO GROWTH

## 2023-07-17 MED ORDER — CEPHALEXIN 500 MG PO CAPS: 500.0000 mg | ORAL_CAPSULE | Freq: Three times a day (TID) | ORAL | 0 refills | Status: DC

## 2023-07-17 NOTE — Telephone Encounter (Signed)
Pt wants to know can she start antibiotic today due to her results. Please advise.

## 2023-07-17 NOTE — Telephone Encounter (Signed)
Ok - this was done erx 

## 2023-07-17 NOTE — Telephone Encounter (Signed)
Called pt she states her urine came back with a lot abnormalities. She think she have a UTI she is requesting antibiotic.Marland KitchenRaechel Chute

## 2023-07-17 NOTE — Telephone Encounter (Signed)
Notified pt rx has been sent to [facility].Marland KitchenRaechel Larson

## 2023-07-18 ENCOUNTER — Encounter: Payer: Self-pay | Admitting: Internal Medicine

## 2023-07-18 NOTE — Assessment & Plan Note (Signed)
Also for mag level with labs

## 2023-07-18 NOTE — Assessment & Plan Note (Signed)
No bruising or swelling, ok for ls spine and sacral films

## 2023-07-18 NOTE — Assessment & Plan Note (Signed)
BP Readings from Last 3 Encounters:  07/16/23 126/78  05/08/23 114/76  05/08/23 110/72   Stable, pt to continue medical treatment card CD 120 qd

## 2023-07-18 NOTE — Assessment & Plan Note (Addendum)
Etiology unclear, exam bening, also for urine culture, declines   referral to ED

## 2023-07-18 NOTE — Assessment & Plan Note (Addendum)
Etiology unclear, did not strike head per pt and no HA or bruising, pt declines stat Head CT, also for CPK with labs, ok for cxr in light of fall and recent wt loss as well

## 2023-07-20 ENCOUNTER — Telehealth: Payer: Self-pay | Admitting: Internal Medicine

## 2023-07-20 NOTE — Telephone Encounter (Signed)
Pt called wanting to know if she need to stop taking medication.  cephALEXin (KEFLEX) 500 MG capsule   Pt stated that the medication is working really well. Please advise.

## 2023-07-20 NOTE — Telephone Encounter (Signed)
Spoke with patient today and all questions answered.  

## 2023-07-21 MED ORDER — NUCALA 100 MG/ML ~~LOC~~ SOAJ: 100.0000 mg | SUBCUTANEOUS | 11 refills | Status: DC

## 2023-07-21 NOTE — Telephone Encounter (Addendum)
Refill for Nucala faxed to [facility].  Fax: [phone] Phone: [phone]  [name], PharmD, MPH, BCPS, CPP Clinical Pharmacist (Rheumatology and Pulmonology)

## 2023-07-21 NOTE — Addendum Note (Signed)
Addended by: Murrell Redden on: 07/21/2023 12:54 PM   Modules accepted: Orders

## 2023-08-03 ENCOUNTER — Ambulatory Visit (INDEPENDENT_AMBULATORY_CARE_PROVIDER_SITE_OTHER): Payer: Medicare Other

## 2023-08-03 VITALS — BP 126/78 | Ht 60.0 in | Wt 126.0 lb

## 2023-08-03 DIAGNOSIS — Z0000 Encounter for general adult medical examination without abnormal findings: Secondary | ICD-10-CM
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
## 2023-08-03 NOTE — Patient Instructions (Addendum)
Lisa Larson , Thank you for taking time to come for your Medicare Wellness Visit. I appreciate your ongoing commitment to your health goals. Please review the following plan we discussed and let me know if I can assist you in the future.   Referrals/Orders/Follow-Ups/Clinician Recommendations: No  This is a list of the screening recommended for you and due dates:  Health Maintenance  Topic Date Due   DEXA scan (bone density measurement)  12/11/2018   DTaP/Tdap/Td vaccine (2 - Td or Tdap) 04/05/2023   Flu Shot  07/30/2023   Medicare Annual Wellness Visit  08/02/2024   Colon Cancer Screening  08/02/2027   Pneumonia Vaccine  Completed   HPV Vaccine  Aged Out   COVID-19 Vaccine  Discontinued   Zoster (Shingles) Vaccine  Discontinued    Advanced directives: (Copy Requested) Please bring a copy of your health care power of attorney and living will to the office to be added to your chart at your convenience.  Next Medicare Annual Wellness Visit scheduled for next year: Yes  Preventive Care 45 Years and Older, Female Preventive care refers to lifestyle choices and visits with your health care provider that can promote health and wellness. What does preventive care include? A yearly physical exam. This is also called an annual well check. Dental exams once or twice a year. Routine eye exams. Ask your health care provider how often you should have your eyes checked. Personal lifestyle choices, including: Daily care of your teeth and gums. Regular physical activity. Eating a healthy diet. Avoiding tobacco and drug use. Limiting alcohol use. Practicing safe sex. Taking low-dose aspirin every day. Taking vitamin and mineral supplements as recommended by your health care provider. What happens during an annual well check? The services and screenings done by your health care provider during your annual well check will depend on your age, overall health, lifestyle risk factors, and family  history of disease. Counseling  Your health care provider may ask you questions about your: Alcohol use. Tobacco use. Drug use. Emotional well-being. Home and relationship well-being. Sexual activity. Eating habits. History of falls. Memory and ability to understand (cognition). Work and work Astronomer. Reproductive health. Screening  You may have the following tests or measurements: Height, weight, and BMI. Blood pressure. Lipid and cholesterol levels. These may be checked every 5 years, or more frequently if you are over 17 years old. Skin check. Lung cancer screening. You may have this screening every year starting at age 24 if you have a 30-pack-year history of smoking and currently smoke or have quit within the past 15 years. Fecal occult blood test (FOBT) of the stool. You may have this test every year starting at age 97. Flexible sigmoidoscopy or colonoscopy. You may have a sigmoidoscopy every 5 years or a colonoscopy every 10 years starting at age 80. Hepatitis C blood test. Hepatitis B blood test. Sexually transmitted disease (STD) testing. Diabetes screening. This is done by checking your blood sugar (glucose) after you have not eaten for a while (fasting). You may have this done every 1-3 years. Bone density scan. This is done to screen for osteoporosis. You may have this done starting at age 65. Mammogram. This may be done every 1-2 years. Talk to your health care provider about how often you should have regular mammograms. Talk with your health care provider about your test results, treatment options, and if necessary, the need for more tests. Vaccines  Your health care provider may recommend certain vaccines, such as:  Influenza vaccine. This is recommended every year. Tetanus, diphtheria, and acellular pertussis (Tdap, Td) vaccine. You may need a Td booster every 10 years. Zoster vaccine. You may need this after age 36. Pneumococcal 13-valent conjugate (PCV13)  vaccine. One dose is recommended after age 53. Pneumococcal polysaccharide (PPSV23) vaccine. One dose is recommended after age 56. Talk to your health care provider about which screenings and vaccines you need and how often you need them. This information is not intended to replace advice given to you by your health care provider. Make sure you discuss any questions you have with your health care provider. Document Released: 01/11/2016 Document Revised: 09/03/2016 Document Reviewed: 10/16/2015 Elsevier Interactive Patient Education  2017 [employer].  Fall Prevention in the Home Falls can cause injuries. They can happen to people of all ages. There are many things you can do to make your home safe and to help prevent falls. What can I do on the outside of my home? Regularly fix the edges of walkways and driveways and fix any cracks. Remove anything that might make you trip as you walk through a door, such as a raised step or threshold. Trim any bushes or trees on the path to your home. Use bright outdoor lighting. Clear any walking paths of anything that might make someone trip, such as rocks or tools. Regularly check to see if handrails are loose or broken. Make sure that both sides of any steps have handrails. Any raised decks and porches should have guardrails on the edges. Have any leaves, snow, or ice cleared regularly. Use sand or salt on walking paths during winter. Clean up any spills in your garage right away. This includes oil or grease spills. What can I do in the bathroom? Use night lights. Install grab bars by the toilet and in the tub and shower. Do not use towel bars as grab bars. Use non-skid mats or decals in the tub or shower. If you need to sit down in the shower, use a plastic, non-slip stool. Keep the floor dry. Clean up any water that spills on the floor as soon as it happens. Remove soap buildup in the tub or shower regularly. Attach bath mats securely with  double-sided non-slip rug tape. Do not have throw rugs and other things on the floor that can make you trip. What can I do in the bedroom? Use night lights. Make sure that you have a light by your bed that is easy to reach. Do not use any sheets or blankets that are too big for your bed. They should not hang down onto the floor. Have a firm chair that has side arms. You can use this for support while you get dressed. Do not have throw rugs and other things on the floor that can make you trip. What can I do in the kitchen? Clean up any spills right away. Avoid walking on wet floors. Keep items that you use a lot in easy-to-reach places. If you need to reach something above you, use a strong step stool that has a grab bar. Keep electrical cords out of the way. Do not use floor polish or wax that makes floors slippery. If you must use wax, use non-skid floor wax. Do not have throw rugs and other things on the floor that can make you trip. What can I do with my stairs? Do not leave any items on the stairs. Make sure that there are handrails on both sides of the stairs and use them.  Fix handrails that are broken or loose. Make sure that handrails are as long as the stairways. Check any carpeting to make sure that it is firmly attached to the stairs. Fix any carpet that is loose or worn. Avoid having throw rugs at the top or bottom of the stairs. If you do have throw rugs, attach them to the floor with carpet tape. Make sure that you have a light switch at the top of the stairs and the bottom of the stairs. If you do not have them, ask someone to add them for you. What else can I do to help prevent falls? Wear shoes that: Do not have high heels. Have rubber bottoms. Are comfortable and fit you well. Are closed at the toe. Do not wear sandals. If you use a stepladder: Make sure that it is fully opened. Do not climb a closed stepladder. Make sure that both sides of the stepladder are locked  into place. Ask someone to hold it for you, if possible. Clearly mark and make sure that you can see: Any grab bars or handrails. First and last steps. Where the edge of each step is. Use tools that help you move around (mobility aids) if they are needed. These include: Canes. Walkers. Scooters. Crutches. Turn on the lights when you go into a dark area. Replace any light bulbs as soon as they burn out. Set up your furniture so you have a clear path. Avoid moving your furniture around. If any of your floors are uneven, fix them. If there are any pets around you, be aware of where they are. Review your medicines with your doctor. Some medicines can make you feel dizzy. This can increase your chance of falling. Ask your doctor what other things that you can do to help prevent falls. This information is not intended to replace advice given to you by your health care provider. Make sure you discuss any questions you have with your health care provider. Document Released: 10/11/2009 Document Revised: 05/22/2016 Document Reviewed: 01/19/2015 Elsevier Interactive Patient Education  2017 [employer].

## 2023-08-24 ENCOUNTER — Other Ambulatory Visit: Payer: Self-pay | Admitting: Internal Medicine

## 2023-09-01 ENCOUNTER — Ambulatory Visit (INDEPENDENT_AMBULATORY_CARE_PROVIDER_SITE_OTHER): Payer: Medicare Other | Admitting: Pulmonary Disease

## 2023-09-01 ENCOUNTER — Encounter (HOSPITAL_BASED_OUTPATIENT_CLINIC_OR_DEPARTMENT_OTHER): Payer: Self-pay | Admitting: Pulmonary Disease

## 2023-09-01 VITALS — BP 118/70 | HR 94 | Resp 16 | Ht 60.0 in | Wt 118.6 lb

## 2023-09-01 DIAGNOSIS — J8289 Other pulmonary eosinophilia, not elsewhere classified: Secondary | ICD-10-CM

## 2023-09-01 DIAGNOSIS — J4550 Severe persistent asthma, uncomplicated: Secondary | ICD-10-CM

## 2023-09-01 MED ORDER — ALBUTEROL SULFATE HFA 108 (90 BASE) MCG/ACT IN AERS
2.0000 | INHALATION_SPRAY | Freq: Four times a day (QID) | RESPIRATORY_TRACT | 5 refills | Status: AC | PRN
Start: 2023-09-01 — End: ?

## 2023-09-01 NOTE — Progress Notes (Signed)
[facility]  Chief Complaint  Patient presents with   Follow-up    Discuss Nucala.Eosinophilic pneumonia    Constitutional:  BP 118/70   Pulse 94   Resp 16   Ht 5' (1.524 m)   Wt 118 lb 9.6 oz (53.8 kg)   SpO2 93%   BMI 23.16 kg/m   Past Medical History:  Anemia, Anxiety, Arthritis, C2 fx, Cataract, Chronic headaches, Depression, GERD, Gout, Hiatal hernia, HTN, Osteoporosis, Urinary incontinence  Past Surgical History:  She  has a past surgical history that includes Shoulder surgery (Left, 08-2008); Total abdominal hysterectomy; Tonsillectomy and adenoidectomy; colonoscopy with polypectomy (06/2013); Nasal sinus surgery; Appendectomy; Esophageal dilation; Bronchoscopy (12-2010); Cataract extraction w/ intraocular lens  implant, bilateral (Bilateral); Knee arthroscopy (Right, 06/18/2015); Tonsillectomy; Cholecystectomy (N/A, 05/23/2016); Upper gastrointestinal endoscopy; Colonoscopy; Polypectomy; Adenoidectomy; and Intramedullary (im) nail intertrochanteric (Right, 03/04/2022).  Brief Summary:  Lisa Larson is a 82 y.o. 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      Subjective:   She is here with her son.  Chest xray from 07/25/23 showed ATX at left base and mild CM.  CBC from 07/16/23 showed 0.1 K/uL eosinophils.    Not having cough, wheeze, or sputum.  No skin rashes.  Sinuses haven't been an issue.  Hasn't needed albuterol.    Physical Exam:   Appearance - well kempt, sitting in her wheelchair  ENMT - no sinus tenderness, no oral exudate, no LAN, Mallampati 2 airway, no stridor  Respiratory - equal breath sounds bilaterally, no wheezing or rales  CV - s1s2 regular rate and rhythm, no murmurs  Ext - no clubbing, no edema  Skin - no rashes  Psych - normal mood and affect     Pulmonary testing:  CBC 01/14/11 >> 39% eosinophils 01/14/11 >> HIV negative, ACE 39, RF  negative, ANA 1:40, ANCA negative   BAL 01/21/11 >> 48% Eosinophils   IgE 02/25/11 >> 282 PFT 11/04/12 >> FEV1 1.77 (87%), FEV1% 68, TLC 4.87 (101%), DLCO 69%, +BD from FEF 25-75%. RAST 10/26/17 >> negative, IgE 108 Spirometry 08/09/19 >> FEV1 1.08 (64%), FEV1% 60  Chest Imaging:  CT chest 01/16/11 >> Multifocal b/l ASD with GGO CT chest 07/22/11 >> resolution of ASD CT chest 10/03/11 >> recurrence of nodular infiltrate Rt upper lobe  Cardiac testing:  Echo 03/04/22 >> EF 60 to 65%, RVSP 35 mmHg, mod TR  Social History:  She  reports that she quit smoking about 54 years ago. Her smoking use included cigarettes. She started smoking about 69 years ago. She has a 15 pack-year smoking history. She has never used smokeless tobacco. She reports that she does not drink alcohol and does not use drugs.  Family History:  Her family history includes Alzheimer's disease in her mother; Arthritis in her father; Cancer in her son; Colitis in her mother; Diabetes in her brother and paternal grandmother; Heart attack in her paternal grandmother; Hypertension in her brother, father, and mother; Irritable bowel syndrome in her mother; Non-Hodgkin's lymphoma in her brother; Other in her son; Pulmonary embolism in her father; Rheum arthritis in her father.     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 - has been on nucala since March 2019; this has helped reduce frequency of exacerbations and improve symptom status - continue nucala 100 mg every 28 days - refill prn albuterol   Allergic rhinitis. - continue nucala - prn flonase  Osteoarthritis. -  f/u with PCP and rheumatology (Dr. Kathi Ludwig)   Adrenal insufficiency from prior chronic prednisone use. - followed by Dr. Lonzo Cloud with endocrinology - maintained on 5 mg prednisone daily; likely helping with asthma   Time Spent Involved in Patient Care on Day of Examination:  27 minutes  Follow up:   Patient  Instructions  Follow up in 6 months  Medication List:   Allergies as of 09/01/2023       Reactions   Other Rash, Shortness Of Breath   Sulfonamide Derivatives Shortness Of Breath, Rash   Oxycodone-aspirin Other (See Comments)   Couldn't hear    Diclofenac Other (See Comments)   Unknown reaction  Other reaction(s): Trouble Breathing/Hives   Oxycodone    Other reaction(s): Trouble Breathing   Prolia [denosumab]    Muscle pain, bone pain   Rofecoxib Other (See Comments)   Unknown reaction  Other reaction(s): Hives/Trouble Breathing   Sulfa Antibiotics    Other reaction(s): Trouble Breathing   Ace Inhibitors Cough   Other reaction(s): Trouble Breathing/Hives Other reaction(s): Trouble Breathing/Hives   Benazepril Hcl Other (See Comments)   No PMH of angioedema; ACE-I caused cough   Tramadol Itching        Medication List        Accurate as of September 01, 2023  9:18 AM. If you have any questions, ask your nurse or doctor.          STOP taking these medications    aspirin 81 MG chewable tablet Stopped by: Raquon Milledge   cephALEXin 500 MG capsule Commonly known as: KEFLEX Stopped by: [name]   chlorhexidine 0.12 % solution Commonly known as: PERIDEX Stopped by: [name]   Pure Calcium Carbonate 1500 (600 Ca) MG Tabs tablet Generic drug: calcium carbonate Stopped by: [name]       TAKE these medications    acetaminophen 325 MG tablet Commonly known as: TYLENOL Take 650 mg by mouth 3 (three) times a week.   albuterol 108 (90 Base) MCG/ACT inhaler Commonly known as: ProAir HFA Inhale 2 puffs into the lungs every 6 (six) hours as needed for wheezing or shortness of breath.   calcium carbonate 600 MG tablet Commonly known as: OS-CAL Take 600 mg by mouth 2 (two) times daily.   diltiazem 120 MG 24 hr capsule Commonly known as: CARDIZEM CD Take 1 capsule (120 mg total) by mouth daily.   gabapentin 300 MG capsule Commonly known as:  NEURONTIN TAKE ONE CAPSULE BY MOUTH THREE TIMES DAILY   LORazepam 0.5 MG tablet Commonly known as: ATIVAN Take 0.5 tablets (0.25 mg total) by mouth 2 (two) times daily.   Nucala 100 MG/ML Soaj Generic drug: Mepolizumab Inject 1 mL (100 mg total) into the skin every 28 (twenty-eight) days. Deliver to patient's home for self-administration.   predniSONE 5 MG tablet Commonly known as: DELTASONE Take 1 tablet (5 mg total) by mouth daily with breakfast.   sertraline 100 MG tablet Commonly known as: ZOLOFT TAKE TWO TABLETS BY MOUTH ONCE DAILY FOR ANXIETY   Vitamin D3 50 MCG (2000 UT) capsule Take 2,000 Units by mouth daily.        Signature:  [name], MD [facility] Pager - [phone] 09/01/2023, 9:18 AM

## 2023-09-01 NOTE — Patient Instructions (Signed)
Follow up in 6 months 

## 2023-09-03 ENCOUNTER — Encounter: Payer: Self-pay | Admitting: Internal Medicine

## 2023-09-07 ENCOUNTER — Encounter: Payer: Self-pay | Admitting: Internal Medicine

## 2023-09-07 NOTE — Progress Notes (Unsigned)
    Subjective:    Patient ID: Lisa Larson, female    DOB: 05-26-1941, 82 y.o.   MRN: [id number]      HPI Lisa Larson is here for No chief complaint on file.   Per her family --- Her memory and grasp of reality has gotten worse in recent weeks - she thinks there are people in her home, she thinks she's staying at a nursing home, she thinks her dead husband is there, she is using her phone as a tv remote.     Medications and allergies reviewed with patient and updated if appropriate.  Current Outpatient Medications on File Prior to Visit  Medication Sig Dispense Refill   acetaminophen (TYLENOL) 325 MG tablet Take 650 mg by mouth 3 (three) times a week.     albuterol (PROAIR HFA) 108 (90 Base) MCG/ACT inhaler Inhale 2 puffs into the lungs every 6 (six) hours as needed for wheezing or shortness of breath. 8 g 5   calcium carbonate (OS-CAL) 600 MG tablet Take 600 mg by mouth 2 (two) times daily.     Cholecalciferol (VITAMIN D3) 2000 UNITS capsule Take 2,000 Units by mouth daily.     diltiazem (CARDIZEM CD) 120 MG 24 hr capsule Take 1 capsule (120 mg total) by mouth daily. 90 capsule 3   gabapentin (NEURONTIN) 300 MG capsule TAKE ONE CAPSULE BY MOUTH THREE TIMES DAILY 90 capsule 0   LORazepam (ATIVAN) 0.5 MG tablet Take 0.5 tablets (0.25 mg total) by mouth 2 (two) times daily. 60 tablet 3   Mepolizumab (NUCALA) 100 MG/ML SOAJ Inject 1 mL (100 mg total) into the skin every 28 (twenty-eight) days. Deliver to patient's home for self-administration. 1 mL 11   predniSONE (DELTASONE) 5 MG tablet Take 1 tablet (5 mg total) by mouth daily with breakfast. 100 tablet 3   sertraline (ZOLOFT) 100 MG tablet TAKE TWO TABLETS BY MOUTH ONCE DAILY FOR ANXIETY 180 tablet 0   No current facility-administered medications on file prior to visit.    Review of Systems     Objective:  There were no vitals filed for this visit. BP Readings from Last 3 Encounters:  09/01/23 118/70  08/03/23 126/78   07/16/23 126/78   Wt Readings from Last 3 Encounters:  09/01/23 118 lb 9.6 oz (53.8 kg)  08/03/23 126 lb (57.2 kg)  07/16/23 126 lb (57.2 kg)   There is no height or weight on file to calculate BMI.    Physical Exam         Assessment & Plan:    See Problem List for Assessment and Plan of chronic medical problems.

## 2023-09-07 NOTE — Patient Instructions (Incomplete)
   Call [facility] to schedule MRI - [facility] - [phone]    Blood work was ordered.   The lab is on the first floor.    Medications changes include :   none   An MRI was ordered of your brain.      A referral was ordered Dr Karel Jarvis  and someone will call you to schedule an appointment.

## 2023-09-08 ENCOUNTER — Encounter: Payer: Self-pay | Admitting: Internal Medicine

## 2023-09-08 ENCOUNTER — Ambulatory Visit (INDEPENDENT_AMBULATORY_CARE_PROVIDER_SITE_OTHER): Payer: Medicare Other | Admitting: Internal Medicine

## 2023-09-08 VITALS — BP 124/80 | HR 92 | Temp 98.0°F | Ht 60.0 in | Wt 124.8 lb

## 2023-09-08 DIAGNOSIS — F419 Anxiety disorder, unspecified: Secondary | ICD-10-CM | POA: Diagnosis not present

## 2023-09-08 DIAGNOSIS — R4182 Altered mental status, unspecified: Secondary | ICD-10-CM

## 2023-09-08 DIAGNOSIS — I10 Essential (primary) hypertension: Secondary | ICD-10-CM | POA: Diagnosis not present

## 2023-09-08 LAB — TSH: TSH: 0.99 u[IU]/mL (ref 0.35–5.50)

## 2023-09-08 LAB — CBC WITH DIFFERENTIAL/PLATELET
Basophils Absolute: 0 10*3/uL (ref 0.0–0.1)
Basophils Relative: 0.4 % (ref 0.0–3.0)
Eosinophils Absolute: 0 10*3/uL (ref 0.0–0.7)
Eosinophils Relative: 0.5 % (ref 0.0–5.0)
HCT: 41.7 % (ref 36.0–46.0)
Hemoglobin: 13.7 g/dL (ref 12.0–15.0)
Lymphocytes Relative: 17.5 % (ref 12.0–46.0)
Lymphs Abs: 1.3 10*3/uL (ref 0.7–4.0)
MCHC: 32.8 g/dL (ref 30.0–36.0)
MCV: 95 fl (ref 78.0–100.0)
Monocytes Absolute: 0.6 10*3/uL (ref 0.1–1.0)
Monocytes Relative: 8.1 % (ref 3.0–12.0)
Neutro Abs: 5.3 10*3/uL (ref 1.4–7.7)
Neutrophils Relative %: 73.5 % (ref 43.0–77.0)
Platelets: 175 10*3/uL (ref 150.0–400.0)
RBC: 4.39 Mil/uL (ref 3.87–5.11)
RDW: 14.4 % (ref 11.5–15.5)
WBC: 7.2 10*3/uL (ref 4.0–10.5)

## 2023-09-08 LAB — VITAMIN B12: Vitamin B-12: 687 pg/mL (ref 211–911)

## 2023-09-08 MED ORDER — LORAZEPAM 0.5 MG PO TABS
0.2500 mg | ORAL_TABLET | Freq: Every day | ORAL | Status: DC
Start: 2023-09-08 — End: 2024-01-18

## 2023-09-08 NOTE — Assessment & Plan Note (Addendum)
New Has had some memory issues in the past so this could be worsening of undiagnosed dementia Need to r/o UTI or other infection, etc that may be causing symptoms Has afib - not on a/c due to fall risk so vascular dementia/stroke is possible ? Related to mistaking medications MRI brain Check ua, ucx Check cbc, cmp, tsh, B12 Son will give her only 2 days of medication at a time Referral to Dr Karel Jarvis who she has seen in the past Does not want to stop lorazepam - discussed this has negative effect on memory - continue one time a day only

## 2023-09-08 NOTE — Assessment & Plan Note (Signed)
Chronic Blood pressure well controlled CMP Continue diltiazem 120 mg daily

## 2023-09-09 ENCOUNTER — Encounter: Payer: Self-pay | Admitting: Physician Assistant

## 2023-09-09 LAB — URINALYSIS, ROUTINE W REFLEX MICROSCOPIC
Bilirubin Urine: NEGATIVE
Hgb urine dipstick: NEGATIVE
Ketones, ur: NEGATIVE
Leukocytes,Ua: NEGATIVE
Nitrite: NEGATIVE
Specific Gravity, Urine: 1.02 (ref 1.000–1.030)
Total Protein, Urine: NEGATIVE
Urine Glucose: NEGATIVE
Urobilinogen, UA: 0.2 (ref 0.0–1.0)
pH: 6 (ref 5.0–8.0)

## 2023-09-09 LAB — COMPREHENSIVE METABOLIC PANEL
ALT: 11 U/L (ref 0–35)
AST: 18 U/L (ref 0–37)
Albumin: 4.3 g/dL (ref 3.5–5.2)
Alkaline Phosphatase: 34 U/L — ABNORMAL LOW (ref 39–117)
BUN: 20 mg/dL (ref 6–23)
CO2: 29 meq/L (ref 19–32)
Calcium: 10.3 mg/dL (ref 8.4–10.5)
Chloride: 105 meq/L (ref 96–112)
Creatinine, Ser: 0.76 mg/dL (ref 0.40–1.20)
GFR: 74.03 mL/min (ref >60.00)
Glucose, Bld: 101 mg/dL — ABNORMAL HIGH (ref 70–99)
Potassium: 4.1 meq/L (ref 3.5–5.1)
Sodium: 142 meq/L (ref 135–145)
Total Bilirubin: 0.4 mg/dL (ref 0.2–1.2)
Total Protein: 7.1 g/dL (ref 6.0–8.3)

## 2023-09-09 LAB — URINE CULTURE: Result:: NO GROWTH

## 2023-09-19 ENCOUNTER — Encounter: Payer: Self-pay | Admitting: Internal Medicine

## 2023-09-22 ENCOUNTER — Ambulatory Visit
Admission: RE | Admit: 2023-09-22 | Discharge: 2023-09-22 | Disposition: A | Discharge status: Home / Self Care | Payer: Medicare Other | Source: Ambulatory Visit | Attending: Internal Medicine

## 2023-09-22 DIAGNOSIS — R4182 Altered mental status, unspecified: Secondary | ICD-10-CM

## 2023-09-22 MED ORDER — GADOPICLENOL 0.5 MMOL/ML IV SOLN
6.0000 mL | Freq: Once | INTRAVENOUS | Status: AC | PRN
  Administered 2023-09-22: 6 mL via INTRAVENOUS

## 2023-09-23 ENCOUNTER — Other Ambulatory Visit: Payer: Self-pay | Admitting: Internal Medicine

## 2023-09-25 ENCOUNTER — Ambulatory Visit: Payer: Medicare Other | Admitting: Cardiology

## 2023-09-26 MED ORDER — QUETIAPINE FUMARATE 25 MG PO TABS: 25.0000 mg | ORAL_TABLET | Freq: Every day | ORAL | 2 refills | Status: DC

## 2023-09-26 NOTE — Addendum Note (Signed)
Addended by: Pincus Sanes on: 09/26/2023 08:21 PM   Modules accepted: Orders

## 2023-09-29 ENCOUNTER — Encounter: Payer: Self-pay | Admitting: Internal Medicine

## 2023-10-03 NOTE — Progress Notes (Addendum)
Assessment/Plan:     Lisa Larson is a very pleasant 82 y.o. year old RH female with a history of hypertension, hyperlipidemia, arthritis,  seen today for evaluation of memory loss with hallucinations. MoCA today is 18/30. She is not on antidementia medications at this time. At this moment it would feel prudent to address the behavioral issues, as they are causing anxiety. She has vivid hallucinations likely due to dementia. She was recently placed on Quetiapine 25 mg, but she is afraid to take because she does not want to fall asleep with all the "beings" there. Her son is going to try to give her the meds in his presence so that she can be compliant with it for effectiveness. She is also on Zoloft 200 mg daily.  Workup is in progress. MRI brain results are pending, images performed at [facility] are not available for review. Will follow.    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   Continue quetiapine, recommend increasing to 50 mg qhs, side effects discussed  Continue Zoloft 200 mg daily, side effects discussed  Continue to control mood as per PCP Obtain the MRI brain results.  Folllow up in 3 months    Subjective:    The patient is accompanied by her son who supplements the history.    How long did patient have memory difficulties?  "Not sure, my memory has always been sketchy".  She had been evaluated at our clinic about 8 years ago at which times symptoms were mild. Patient reports some difficulty remembering new information, recent conversations, names. She has been using the phone as her remote.  repeats oneself?  Endorsed Disoriented when walking into a room? Sometimes she is at home but is suddenly at someone else's home.    Leaving objects in unusual places? Sometimes  Wandering behavior? denies   Any personality changes, or depression, anxiety? Endorsed.   Hallucinations or paranoia? Endorsed, since a fall in July, initially thought to be due to UTI, but these returned.  She sees people that are not there, including her deceased husband one time holding a knife, babies flying in the air,  becoming scary to her. One time she called her son because a girl was picking through the door, wanted him to call the police. She has major hallucinations with snakes, and stuffed animals.  Last night she did not want to take it because she thinks that someone is going to try to kill her if she sleeps. Worst at late afternoon and night.  She was prescribed quetiapine 25 mg by PCP. She is on Ativan prn and Zoloft as well.  Seizures? denies    Any sleep changes?  Sleeps well , denies vivid dreams, REM behavior or sleepwalking   Sleep apnea? Denies.   Any hygiene concerns?  She needs to be reminded to shower. Independent of bathing and dressing? Endorsed, most of the time  Does the patient need help with medications?  Son is in charge after she was not taking them correctly, sometimes overdosing.   Who is in charge of the finances?Son  is in charge     Any changes in appetite?   Decreased.    Patient have trouble swallowing?  Denies.   Does the patient cook? No  Any headaches?  Denies.   Chronic pain? Denies.   Ambulates with difficulty? Needs a walker to ambulate for stability.  Recent falls or head injuries? She fell and thought she was at the [facility] and hallucinating and PCP referred her here. Vision changes?  Denies any new issues but does report that sometimes she has pain behind the R eye and headaches, this has been present for many years  Any stroke like symptoms? Denies.   Any tremors? Denies.   Any anosmia? Denies.   Any incontinence of urine? Endorsed, wears diapers Any bowel dysfunction? Occasional diarrhea     Patient lives alone with son monitoring frequently  History of heavy alcohol intake? Denies.   History of heavy tobacco use? Denies.   Family  history of dementia?  Mother and brother had  dementia ?AD Does patient drive? No    Allergies  Allergen Reactions   Other Rash and Shortness Of Breath   Sulfonamide Derivatives Shortness Of Breath and Rash   Oxycodone-Aspirin Other (See Comments)    Couldn't hear    Diclofenac Other (See Comments)    Unknown reaction  Other reaction(s): Trouble Breathing/Hives   Oxycodone     Other reaction(s): Trouble Breathing   Prolia [Denosumab]     Muscle pain, bone pain   Rofecoxib Other (See Comments)    Unknown reaction  Other reaction(s): Hives/Trouble Breathing   Sulfa Antibiotics     Other reaction(s): Trouble Breathing   Ace Inhibitors Cough    Other reaction(s): Trouble Breathing/Hives Other reaction(s): Trouble Breathing/Hives   Benazepril Hcl Other (See Comments)    No PMH of angioedema; ACE-I caused cough   Tramadol Itching    Current Outpatient Medications  Medication Instructions   acetaminophen (TYLENOL) 650 mg, Oral, 3 times weekly   albuterol (PROAIR HFA) 108 (90 Base) MCG/ACT inhaler 2 puffs, Inhalation, Every 6 hours PRN   calcium carbonate (OS-CAL) 600 mg, Oral, 2 times daily   diltiazem (CARDIZEM CD) 120 mg, Oral, Daily   gabapentin (NEURONTIN) 300 MG capsule TAKE ONE CAPSULE BY MOUTH THREE TIMES DAILY   LORazepam (ATIVAN) 0.25 mg, Oral, Daily   Nucala 100 mg, Subcutaneous, Every 28 days, Deliver to patient's home for self-administration.   predniSONE (DELTASONE) 5 mg, Oral, Daily with breakfast   QUEtiapine (SEROQUEL) 25 mg, Oral, Daily at bedtime   sertraline (ZOLOFT) 100 MG tablet TAKE TWO TABLETS BY MOUTH ONCE DAILY FOR ANXIETY   Vitamin D3 2,000 Units, Daily     VITALS:   Vitals:   10/06/23 1308  Pulse: 84  Resp: 20  SpO2: 95%  Height: 5' (1.524 m)       PHYSICAL EXAM   HEENT:  Normocephalic, atraumatic.  The superficial temporal arteries are without ropiness or tenderness. Cardiovascular: Regular rate and rhythm. Lungs: Clear to auscultation  bilaterally. Neck: There are no carotid bruits noted bilaterally.  NEUROLOGICAL:    10/06/2023    3:00 PM 12/26/2014   10:38 AM  Montreal Cognitive Assessment   Visuospatial/ Executive (0/5) 2 4  Naming (0/3) 3 3  Attention: Read list of digits (0/2) 2 2  Attention: Read list of letters (0/1) 1 1  Attention: Serial 7 subtraction starting at 100 (0/3) 1 3  Language: Repeat phrase (0/2) 2 2  Language : Fluency (0/1) 1 1  Abstraction (0/2) 2 2  Delayed Recall (0/5) 0 5  Orientation (0/6) 3 6  Total 17 29  Adjusted Score (based on education) 18 29       11/16/2018    3:55 PM 11/03/2017   10:00 AM 07/23/2017   11:21 AM  MMSE - Mini Mental  State Exam  Orientation to time 5 4 5   Orientation to Place 5 5 5   Registration 3 3 3   Attention/ Calculation 3 5 4   Recall 3 3 3   Language- name 2 objects 2 2 2   Language- repeat 1 1 1   Language- follow 3 step command 3 3 3   Language- read & follow direction 1 1 1   Write a sentence 1 1 1   Copy design 1 1 1   Total score 28 29 29      Orientation:  Alert and oriented to person, place and not to time. No aphasia or dysarthria. Fund of knowledge is appropriate. Recent and remote memory impaired.  Attention and concentration are reduced.  Able to name objects and repeat phrases. Delayed recall 0/5 Cranial nerves: There is good facial symmetry. Extraocular muscles are intact and visual fields are full to confrontational testing. Speech is fluent and clear. No tongue deviation. Hearing is intact to conversational tone.  Tone: Tone is good throughout. Sensation: Sensation is intact to light touch.  Vibration is intact at the bilateral big toe.  Coordination: The patient has no difficulty with RAM's or FNF bilaterally. Normal finger to nose  Motor: Strength is 5/5 in the bilateral upper and lower extremities. There is no pronator drift. There are no fasciculations noted. DTR's: Deep tendon reflexes are 2/4 bilaterally. Gait and Station: The patient  is able to ambulate with difficulty due to knee arthritis. Gait is cautious and narrow. Stride length is short.     Thank you for allowing [place] the opportunity to participate in the care of this nice patient. Please do not hesitate to contact us for any questions or concerns.   Total time spent on today's visit was 52 minutes dedicated to this patient today, preparing to see patient, examining the patient, ordering tests and/or medications and counseling the patient, documenting clinical information in the EHR or other health record, independently interpreting results and communicating results to the patient/family, discussing treatment and goals, answering patient's questions and coordinating care.  Cc:  Pincus Sanes, MD  Marlowe Kays 10/06/2023 3:07 PM

## 2023-10-06 ENCOUNTER — Ambulatory Visit (INDEPENDENT_AMBULATORY_CARE_PROVIDER_SITE_OTHER): Payer: Medicare Other | Admitting: Physician Assistant

## 2023-10-06 ENCOUNTER — Ambulatory Visit: Payer: Medicare Other

## 2023-10-06 ENCOUNTER — Encounter: Payer: Self-pay | Admitting: Physician Assistant

## 2023-10-06 VITALS — BP 110/72 | HR 84 | Resp 20 | Ht 60.0 in

## 2023-10-06 DIAGNOSIS — G309 Alzheimer's disease, unspecified: Secondary | ICD-10-CM

## 2023-10-06 DIAGNOSIS — F02818 Dementia in other diseases classified elsewhere, unspecified severity, with other behavioral disturbance: Secondary | ICD-10-CM | POA: Insufficient documentation

## 2023-10-06 NOTE — Patient Instructions (Signed)
It was a pleasure to see you today at our office.   Recommendations:  Recommend increasing quetiapine to 50 mg at night Zoloft in the morning  Follow up in 3  month Recommend visiting the website : " Dementia Success [facility]" to better understand some behaviors related to memory loss.    For guidance regarding [facility] and if placement were needed at the facility, contact Sidney Ace, Social Worker tel: [phone]  For assessment of decision of mental capacity and competency:  Call Dr. Erick Blinks, geriatric psychiatrist at [phone] Counseling regarding caregiver distress, including caregiver depression, anxiety and issues regarding community resources, adult day care programs, adult living facilities, or memory care questions:  please contact your  Primary Doctor's Social Worker  Whom to call: Memory  decline, memory medications: Call our office [phone]  For psychiatric meds, mood meds: Please have your primary care physician manage these medications.  If you have any severe symptoms of a stroke, or other severe issues such as confusion,severe chills or fever, etc call 911 or go to the ER as you may need to be evaluated further   [email or web address]   RECOMMENDATIONS FOR ALL PATIENTS WITH MEMORY PROBLEMS: 1. Continue to exercise (Recommend 30 minutes of walking everyday, or 3 hours every week) 2. Increase social interactions - continue going to [place] and enjoy social gatherings with friends and family 3. Eat healthy, avoid fried foods and eat more fruits and vegetables 4. Maintain adequate blood pressure, blood sugar, and blood cholesterol level. Reducing the risk of stroke and cardiovascular disease also helps promoting better memory. 5. Avoid stressful situations. Live a simple life and avoid aggravations. Organize your time and prepare for the next day in anticipation. 6. Sleep well, avoid any  interruptions of sleep and avoid any distractions in the bedroom that may interfere with adequate sleep quality 7. Avoid sugar, avoid sweets as there is a strong link between excessive sugar intake, diabetes, and cognitive impairment We discussed the Mediterranean diet, which has been shown to help patients reduce the risk of progressive memory disorders and reduces cardiovascular risk. This includes eating fish, eat fruits and green leafy vegetables, nuts like almonds and hazelnuts, walnuts, and also use olive oil. Avoid fast foods and fried foods as much as possible. Avoid sweets and sugar as sugar use has been linked to worsening of memory function.  There is always a concern of gradual progression of memory problems. If this is the case, then we may need to adjust level of care according to patient needs. Support, both to the patient and caregiver, should then be put into place.    The Alzheimer's Association is here all day, every day for people facing Alzheimer's disease through our free 24/7 Helpline: [phone]. The Helpline provides reliable information and support to all those who need assistance, such as individuals living with memory loss, Alzheimer's or other dementia, caregivers, health care professionals and the public.  Our highly trained and knowledgeable staff can help you with: Understanding memory loss, dementia and Alzheimer's  Medications and other treatment options  General information about aging and brain health  Skills to provide quality care and to find the best care from professionals  Legal, financial and living-arrangement decisions Our Helpline also features: Confidential care consultation provided by master's level clinicians who can help with decision-making support, crisis assistance and education on issues families face every day  Help in a caller's preferred language using our translation service that features more than 200 languages  and dialects  Referrals to  local community programs, services and ongoing support     FALL PRECAUTIONS: Be cautious when walking. Scan the area for obstacles that may increase the risk of trips and falls. When getting up in the mornings, sit up at the edge of the bed for a few minutes before getting out of bed. Consider elevating the bed at the head end to avoid drop of blood pressure when getting up. Walk always in a well-lit room (use night lights in the walls). Avoid area rugs or power cords from appliances in the middle of the walkways. Use a walker or a cane if necessary and consider physical therapy for balance exercise. Get your eyesight checked regularly.  FINANCIAL OVERSIGHT: Supervision, especially oversight when making financial decisions or transactions is also recommended.  HOME SAFETY: Consider the safety of the kitchen when operating appliances like stoves, microwave oven, and blender. Consider having supervision and share cooking responsibilities until no longer able to participate in those. Accidents with firearms and other hazards in the house should be identified and addressed as well.   ABILITY TO BE LEFT ALONE: If patient is unable to contact 911 operator, consider using LifeLine, or when the need is there, arrange for someone to stay with patients. Smoking is a fire hazard, consider supervision or cessation. Risk of wandering should be assessed by caregiver and if detected at any point, supervision and safe proof recommendations should be instituted.  MEDICATION SUPERVISION: Inability to self-administer medication needs to be constantly addressed. Implement a mechanism to ensure safe administration of the medications.   DRIVING: Regarding driving, in patients with progressive memory problems, driving will be impaired. We advise to have someone else do the driving if trouble finding directions or if minor accidents are reported. Independent driving assessment is available to determine safety of  driving.   If you are interested in the driving assessment, you can contact the following:  [employer] in [place] [phone]   [facility] [phone] or [phone]      Mediterranean Diet A Mediterranean diet refers to food and lifestyle choices that are based on the traditions of countries located on the [employer]. This way of eating has been shown to help prevent certain conditions and improve outcomes for people who have chronic diseases, like kidney disease and heart disease. What are tips for following this plan? Lifestyle  Cook and eat meals together with your family, when possible. Drink enough fluid to keep your urine clear or pale yellow. Be physically active every day. This includes: Aerobic exercise like running or swimming. Leisure activities like gardening, walking, or housework. Get 7-8 hours of sleep each night. If recommended by your health care provider, drink red wine in moderation. This means 1 glass a day for nonpregnant women and 2 glasses a day for men. A glass of wine equals 5 oz (150 mL). Reading food labels  Check the serving size of packaged foods. For foods such as rice and pasta, the serving size refers to the amount of cooked product, not dry. Check the total fat in packaged foods. Avoid foods that have saturated fat or trans fats. Check the ingredients list for added sugars, such as corn syrup. Shopping  At the grocery store, buy most of your food from the areas near the walls of the store. This includes: Fresh fruits and vegetables (produce). Grains, beans, nuts, and seeds. Some of these may be available in unpackaged forms or  large amounts (in bulk). Fresh seafood. Poultry and eggs. Low-fat dairy products. Buy whole ingredients instead of prepackaged foods. Buy fresh fruits and vegetables in-season from local farmers markets. Buy frozen fruits and vegetables in resealable  bags. If you do not have access to quality fresh seafood, buy precooked frozen shrimp or canned fish, such as tuna, salmon, or sardines. Buy small amounts of raw or cooked vegetables, salads, or olives from the deli or salad bar at your store. Stock your pantry so you always have certain foods on hand, such as olive oil, canned tuna, canned tomatoes, rice, pasta, and beans. Cooking  Cook foods with extra-virgin olive oil instead of using butter or other vegetable oils. Have meat as a side dish, and have vegetables or grains as your main dish. This means having meat in small portions or adding small amounts of meat to foods like pasta or stew. Use beans or vegetables instead of meat in common dishes like chili or lasagna. Experiment with different cooking methods. Try roasting or broiling vegetables instead of steaming or sauteing them. Add frozen vegetables to soups, stews, pasta, or rice. Add nuts or seeds for added healthy fat at each meal. You can add these to yogurt, salads, or vegetable dishes. Marinate fish or vegetables using olive oil, lemon juice, garlic, and fresh herbs. Meal planning  Plan to eat 1 vegetarian meal one day each week. Try to work up to 2 vegetarian meals, if possible. Eat seafood 2 or more times a week. Have healthy snacks readily available, such as: Vegetable sticks with hummus. Greek yogurt. Fruit and nut trail mix. Eat balanced meals throughout the week. This includes: Fruit: 2-3 servings a day Vegetables: 4-5 servings a day Low-fat dairy: 2 servings a day Fish, poultry, or lean meat: 1 serving a day Beans and legumes: 2 or more servings a week Nuts and seeds: 1-2 servings a day Whole grains: 6-8 servings a day Extra-virgin olive oil: 3-4 servings a day Limit red meat and sweets to only a few servings a month What are my food choices? Mediterranean diet Recommended Grains: Whole-grain pasta. Brown rice. Bulgar wheat. Polenta. Couscous. Whole-wheat bread.  Orpah Cobb. Vegetables: Artichokes. Beets. Broccoli. Cabbage. Carrots. Eggplant. Green beans. Chard. Kale. Spinach. Onions. Leeks. Peas. Squash. Tomatoes. Peppers. Radishes. Fruits: Apples. Apricots. Avocado. Berries. Bananas. Cherries. Dates. Figs. Grapes. Lemons. Melon. Oranges. Peaches. Plums. Pomegranate. Meats and other protein foods: Beans. Almonds. Sunflower seeds. Pine nuts. Peanuts. Cod. Salmon. Scallops. Shrimp. Tuna. Tilapia. Clams. Oysters. Eggs. Dairy: Low-fat milk. Cheese. Greek yogurt. Beverages: Water. Red wine. Herbal tea. Fats and oils: Extra virgin olive oil. Avocado oil. Grape seed oil. Sweets and desserts: [place] yogurt with honey. Baked apples. Poached pears. Trail mix. Seasoning and other foods: Basil. Cilantro. Coriander. Cumin. Mint. Parsley. Sage. Rosemary. Tarragon. Garlic. Oregano. Thyme. Pepper. Balsalmic vinegar. Tahini. Hummus. Tomato sauce. Olives. Mushrooms. Limit these Grains: Prepackaged pasta or rice dishes. Prepackaged cereal with added sugar. Vegetables: Deep fried potatoes (french fries). Fruits: Fruit canned in syrup. Meats and other protein foods: Beef. Pork. Lamb. Poultry with skin. Hot dogs. Tomasa Blase. Dairy: Ice cream. Sour cream. Whole milk. Beverages: Juice. Sugar-sweetened soft drinks. Beer. Liquor and spirits. Fats and oils: Butter. Canola oil. Vegetable oil. Beef fat (tallow). Lard. Sweets and desserts: Cookies. Cakes. Pies. Candy. Seasoning and other foods: Mayonnaise. Premade sauces and marinades. The items listed may not be a complete list. Talk with your dietitian about what dietary choices are right for you. Summary The Mediterranean diet includes both food  and lifestyle choices. Eat a variety of fresh fruits and vegetables, beans, nuts, seeds, and whole grains. Limit the amount of red meat and sweets that you eat. Talk with your health care provider about whether it is safe for you to drink red wine in moderation. This means 1 glass a day  for nonpregnant women and 2 glasses a day for men. A glass of wine equals 5 oz (150 mL). This information is not intended to replace advice given to you by your health care provider. Make sure you discuss any questions you have with your health care provider. Document Released: 08/07/2016 Document Revised: 09/09/2016 Document Reviewed: 08/07/2016 Elsevier Interactive Patient Education  2017 [employer].

## 2023-10-08 MED ORDER — QUETIAPINE FUMARATE 50 MG PO TABS: 50.0000 mg | ORAL_TABLET | Freq: Every day | ORAL | 0 refills | Status: DC

## 2023-10-09 ENCOUNTER — Telehealth: Payer: Self-pay | Admitting: Internal Medicine

## 2023-10-09 NOTE — Telephone Encounter (Signed)
Calling about her MRI results and if they were checked by her PCP. HER HEAD IS KILLING HER.Marland Kitchen

## 2023-10-09 NOTE — Telephone Encounter (Signed)
Called radiology - the will read it

## 2023-10-15 ENCOUNTER — Encounter: Payer: Self-pay | Admitting: Internal Medicine

## 2023-10-15 ENCOUNTER — Ambulatory Visit: Payer: Medicare Other | Admitting: Internal Medicine

## 2023-10-15 VITALS — BP 110/80 | HR 87 | Ht 60.0 in | Wt 128.4 lb

## 2023-10-15 DIAGNOSIS — J8283 Eosinophilic asthma: Secondary | ICD-10-CM | POA: Diagnosis not present

## 2023-10-15 DIAGNOSIS — E2740 Unspecified adrenocortical insufficiency: Secondary | ICD-10-CM

## 2023-10-15 NOTE — Patient Instructions (Addendum)
It was a pleasure to see you today!  Please schedule follow up scheduled with myself in 6 months.  If my schedule is not open yet, we will contact you with a reminder closer to that time. Please call [phone] if you haven't heard from [place] a month before, and always call us sooner if issues or concerns arise. You can also send [place] a message through MyChart, but but aware that this is not to be used for urgent issues and it may take up to 5-7 days to receive a reply. Please be aware that you will likely be able to view your results before I have a chance to respond to them. Please give [place] 5 business days to respond to any non-urgent results.   Continue your nucala injections and the albuterol inhaler as needed. If you start feeling more short of breath start taking the fluticasone regularly.
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
## 2023-10-26 ENCOUNTER — Other Ambulatory Visit: Payer: Self-pay | Admitting: Internal Medicine

## 2023-11-04 ENCOUNTER — Telehealth: Payer: Self-pay | Admitting: Internal Medicine

## 2023-11-04 ENCOUNTER — Telehealth: Payer: Self-pay | Admitting: Pharmacist

## 2023-11-04 NOTE — Telephone Encounter (Signed)
Patient requesting PAP renewal application for Nucala for 2025. Mailed application to home today.  Provider portion placed in Dr. Humphrey Rolls mailbox for signature  [name], PharmD, MPH, BCPS, CPP Clinical Pharmacist (Rheumatology and Pulmonology)

## 2023-11-05 ENCOUNTER — Ambulatory Visit: Payer: Medicare Other | Admitting: Cardiology

## 2023-11-10 ENCOUNTER — Encounter: Payer: Self-pay | Admitting: Internal Medicine

## 2023-11-10 ENCOUNTER — Ambulatory Visit: Payer: Medicare Other | Admitting: Internal Medicine

## 2023-11-10 NOTE — Telephone Encounter (Signed)
nfn

## 2023-11-10 NOTE — Patient Instructions (Addendum)
       Medications changes include :   start 12.5 mg of Seroquel at lunch or early afternoon.  Try stopping the lorazepam at night.        Return in about 6 months (around 05/10/2024) for follow up.

## 2023-11-10 NOTE — Progress Notes (Unsigned)
Subjective:    Patient ID: Lisa Larson, female    DOB: 02/14/1941, 82 y.o.   MRN: [id number]     HPI Lisa Larson is here for follow up of her chronic medical problems.  She is here today with her son who helps provide the history.  Headaches - frequent - started < one month ago - sometimes wakes up them and other times they just come.  In the morning it is usually the top of her head.  It could be the side of her head or anywhere.  Takes tylenol or advil as needed.  Has headaches daily.    She is sleeping great.  Taking the seroquel she sleeps til 5-6 am.  She is sundowning in the evening.  Her son states that in the late afternoon or evening she has hallucinations that are very disturbing.  They do scare her and sometimes she has difficulty knowing they are not real.  Medications and allergies reviewed with patient and updated if appropriate.  Current Outpatient Medications on File Prior to Visit  Medication Sig Dispense Refill   acetaminophen (TYLENOL) 325 MG tablet Take 650 mg by mouth 3 (three) times a week.     albuterol (PROAIR HFA) 108 (90 Base) MCG/ACT inhaler Inhale 2 puffs into the lungs every 6 (six) hours as needed for wheezing or shortness of breath. 8 g 5   calcium carbonate (OS-CAL) 600 MG tablet Take 600 mg by mouth 2 (two) times daily.     Cholecalciferol (VITAMIN D3) 2000 UNITS capsule Take 2,000 Units by mouth daily.     diltiazem (CARDIZEM CD) 120 MG 24 hr capsule Take 1 capsule (120 mg total) by mouth daily. 90 capsule 3   gabapentin (NEURONTIN) 300 MG capsule TAKE ONE CAPSULE BY MOUTH THREE TIMES DAILY 90 capsule 0   LORazepam (ATIVAN) 0.5 MG tablet Take 0.5 tablets (0.25 mg total) by mouth daily.     Mepolizumab (NUCALA) 100 MG/ML SOAJ Inject 1 mL (100 mg total) into the skin every 28 (twenty-eight) days. Deliver to patient's home for self-administration. 1 mL 11   predniSONE (DELTASONE) 5 MG tablet Take 1 tablet (5 mg total) by mouth daily with  breakfast. 100 tablet 3   QUEtiapine (SEROQUEL) 50 MG tablet Take 1 tablet (50 mg total) by mouth at bedtime. 90 tablet 0   sertraline (ZOLOFT) 100 MG tablet TAKE TWO TABLETS BY MOUTH ONCE DAILY FOR ANXIETY 180 tablet 0   No current facility-administered medications on file prior to visit.     Review of Systems  Constitutional:  Negative for fever.  Respiratory:  Negative for cough, shortness of breath and wheezing.   Cardiovascular:  Positive for palpitations (occ). Negative for chest pain and leg swelling.  Gastrointestinal:  Negative for nausea.  Musculoskeletal:  Positive for neck pain.  Neurological:  Positive for headaches. Negative for dizziness and light-headedness.       Objective:   Vitals:   11/11/23 1317  BP: 120/80  Pulse: 70  Temp: 98 F (36.7 C)  SpO2: 94%   BP Readings from Last 3 Encounters:  11/11/23 120/80  10/15/23 110/80  10/06/23 110/72   Wt Readings from Last 3 Encounters:  11/11/23 127 lb (57.6 kg)  10/15/23 128 lb 6.4 oz (58.2 kg)  09/08/23 124 lb 12.8 oz (56.6 kg)   Body mass index is 24.8 kg/m.    Physical Exam Constitutional:      General: She is not in acute distress.  Appearance: Normal appearance.  HENT:     Head: Normocephalic and atraumatic.  Eyes:     Conjunctiva/sclera: Conjunctivae normal.  Cardiovascular:     Rate and Rhythm: Normal rate. Rhythm irregular.     Heart sounds: Normal heart sounds.  Pulmonary:     Effort: Pulmonary effort is normal. No respiratory distress.     Breath sounds: Normal breath sounds. No wheezing.  Musculoskeletal:     Cervical back: Neck supple.     Right lower leg: No edema.     Left lower leg: No edema.  Lymphadenopathy:     Cervical: No cervical adenopathy.  Skin:    General: Skin is warm and dry.     Findings: No rash.  Neurological:     Mental Status: She is alert. Mental status is at baseline.  Psychiatric:        Mood and Affect: Mood normal.        Behavior: Behavior normal.         Lab Results  Component Value Date   WBC 7.2 09/08/2023   HGB 13.7 09/08/2023   HCT 41.7 09/08/2023   PLT 175.0 09/08/2023   GLUCOSE 101 (H) 09/08/2023   CHOL 168 05/08/2023   TRIG 236.0 (H) 05/08/2023   HDL 53.10 05/08/2023   LDLDIRECT 79.0 05/08/2023   LDLCALC 69 11/06/2022   ALT 11 09/08/2023   AST 18 09/08/2023   NA 142 09/08/2023   K 4.1 09/08/2023   CL 105 09/08/2023   CREATININE 0.76 09/08/2023   BUN 20 09/08/2023   CO2 29 09/08/2023   TSH 0.99 09/08/2023   INR 1.0 03/03/2022   HGBA1C 5.4 05/08/2023     Assessment & Plan:    See Problem List for Assessment and Plan of chronic medical problems.

## 2023-11-11 ENCOUNTER — Encounter: Payer: Self-pay | Admitting: Internal Medicine

## 2023-11-11 ENCOUNTER — Ambulatory Visit (INDEPENDENT_AMBULATORY_CARE_PROVIDER_SITE_OTHER): Payer: Medicare Other | Admitting: Internal Medicine

## 2023-11-11 VITALS — BP 120/80 | HR 70 | Temp 98.0°F | Ht 60.0 in | Wt 127.0 lb

## 2023-11-11 DIAGNOSIS — F02818 Dementia in other diseases classified elsewhere, unspecified severity, with other behavioral disturbance: Secondary | ICD-10-CM

## 2023-11-11 DIAGNOSIS — F3289 Other specified depressive episodes: Secondary | ICD-10-CM | POA: Diagnosis not present

## 2023-11-11 DIAGNOSIS — I10 Essential (primary) hypertension: Secondary | ICD-10-CM

## 2023-11-11 DIAGNOSIS — M549 Dorsalgia, unspecified: Secondary | ICD-10-CM | POA: Diagnosis not present

## 2023-11-11 DIAGNOSIS — R519 Headache, unspecified: Secondary | ICD-10-CM

## 2023-11-11 DIAGNOSIS — F419 Anxiety disorder, unspecified: Secondary | ICD-10-CM

## 2023-11-11 DIAGNOSIS — G309 Alzheimer's disease, unspecified: Secondary | ICD-10-CM

## 2023-11-11 DIAGNOSIS — I4891 Unspecified atrial fibrillation: Secondary | ICD-10-CM

## 2023-11-11 MED ORDER — QUETIAPINE FUMARATE 25 MG PO TABS: 12.5000 mg | ORAL_TABLET | Freq: Every day | ORAL | Status: DC

## 2023-11-11 NOTE — Assessment & Plan Note (Signed)
Chronic Feels sad at times but denies depression Continue sertraline 200 mg daily

## 2023-11-11 NOTE — Assessment & Plan Note (Signed)
Chronic Following with cardiology On diltiazem 120 mg daily, aspirin 81 mg daily

## 2023-11-11 NOTE — Assessment & Plan Note (Signed)
Chronic Blood pressure well controlled Continue diltiazem 120 mg daily

## 2023-11-11 NOTE — Assessment & Plan Note (Signed)
Acute Send states she has been complaining of frequent headaches for least a month-having headaches on a daily basis Headaches seem to be primarily posterior head and top of head Likely related to her chronic neck pain Following with neurosurgery Continue gabapentin 300 mg 3 times daily Biofreeze seems to help-continue Tylenol as needed

## 2023-11-11 NOTE — Assessment & Plan Note (Signed)
Chronic Continue gabapentin 300 mg 3 times daily 

## 2023-11-11 NOTE — Assessment & Plan Note (Addendum)
Chronic Increased anxiety related to hallucinations - very agitated and sometimes scared. Continue sertraline 200 mg daily She has been on lorazepam 0.25 mg at night very long time Discussed that since she is sleeping well trying to discontinue this since it can negatively affect her memory-she will try

## 2023-11-11 NOTE — Assessment & Plan Note (Signed)
Chronic Following with neurology Has hallucinations in the evening and at night Continue Seroquel milligrams nightly Given hallucinations and late afternoon evening we will start Seroquel 12.5 mg around lunch early afternoon to see how that helps-can adjust medication as needed

## 2023-11-12 NOTE — Telephone Encounter (Signed)
Received MDO form from Dr. Celine Mans, PAP submission is pending return of patient portion. Appears that patient portion was placed in Dr. Humphrey Rolls mailbox instead of pharmacy mailbox. Message sent to [facility] for it to be placed in pharmacy mailbox  Submitted a Prior Authorization renewal request to [facility] for NUCALA via CoverMyMeds. Will update once we receive a response.  Key: [id number]   [name], PharmD, MPH, BCPS, CPP Clinical Pharmacist (Rheumatology and Pulmonology)

## 2023-11-12 NOTE — Telephone Encounter (Signed)
PT 's son ret form to front desk asking me to put in Dr. Humphrey Rolls box. I made him a copy and did so. NFN

## 2023-11-12 NOTE — Telephone Encounter (Signed)
Received signed patient form. Scanned patient form, provider form, med list, insurance card copy to [place].  Submission pending PA approval  [name], PharmD, MPH, BCPS, CPP Clinical Pharmacist (Rheumatology and Pulmonology)

## 2023-11-19 NOTE — Telephone Encounter (Signed)
Received a fax regarding Prior Authorization from [facility] for NUCALA. Authorization has been DENIED because the drug will not be covered if patient is receiving through patient assistance program.  If requested from [facility], will need to submit PA approval after new year  Submitted Patient Assistance RENEWAL Application to [facility] [place] for NUCALA along with provider portion, patient portion, medication list, insurance card copy. Will update patient when we receive a response.  Phone #: [phone] Fax #: [phone]  [name], PharmD, MPH, BCPS, CPP Clinical Pharmacist (Rheumatology and Pulmonology)

## 2023-11-29 ENCOUNTER — Other Ambulatory Visit: Payer: Self-pay | Admitting: Internal Medicine

## 2023-12-16 ENCOUNTER — Encounter: Payer: Self-pay | Admitting: Cardiology

## 2023-12-16 ENCOUNTER — Ambulatory Visit: Payer: Medicare Other | Attending: Cardiology | Admitting: Cardiology

## 2023-12-16 VITALS — BP 128/78 | HR 84 | Ht 60.0 in | Wt 132.0 lb

## 2023-12-16 DIAGNOSIS — I10 Essential (primary) hypertension: Secondary | ICD-10-CM | POA: Insufficient documentation

## 2023-12-16 DIAGNOSIS — I480 Paroxysmal atrial fibrillation: Secondary | ICD-10-CM | POA: Insufficient documentation

## 2023-12-16 MED ORDER — DILTIAZEM HCL ER COATED BEADS 120 MG PO CP24: 120.0000 mg | ORAL_CAPSULE | Freq: Every day | ORAL | 3 refills | Status: DC

## 2023-12-16 NOTE — Patient Instructions (Signed)
Medication Instructions:  NO CHANGES *If you need a refill on your cardiac medications before your next appointment, please call your pharmacy*   Lab Work: NO LABS If you have labs (blood work) drawn today and your tests are completely normal, you will receive your results only by: MyChart Message (if you have MyChart) OR A paper copy in the mail If you have any lab test that is abnormal or we need to change your treatment, we will call you to review the results.   Testing/Procedures: NO TESTING   Follow-Up: At [facility], you and your health needs are our priority.  As part of our continuing mission to provide you with exceptional heart care, we have created designated Provider Care Teams.  These Care Teams include your primary Cardiologist (physician) and Advanced Practice Providers (APPs -  Physician Assistants and Nurse Practitioners) who all work together to provide you with the care you need, when you need it.   Your next appointment:   9 month(s)  Provider:   Thomasene Ripple, DO
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
## 2023-12-21 ENCOUNTER — Other Ambulatory Visit: Payer: Self-pay | Admitting: Internal Medicine

## 2024-01-05 ENCOUNTER — Encounter: Payer: Self-pay | Admitting: Internal Medicine

## 2024-01-06 NOTE — Telephone Encounter (Signed)
 Submitted a Prior Authorization request to [facility] for NUCALA  via CoverMyMeds. Will update once we receive a response.  Key: [id number]  Automated response: System was not able to process the request because the previous Prior Authorization Request was Denied. Previous PA was denied because patient is receiving through patient assistance  Sherry Pennant, PharmD, MPH, BCPS, CPP Clinical Pharmacist (Rheumatology and Pulmonology)

## 2024-01-08 ENCOUNTER — Telehealth: Payer: Self-pay | Admitting: Internal Medicine

## 2024-01-08 NOTE — Telephone Encounter (Signed)
 PT daughter calling about her mom's [employer] application status. Her # is [phone]

## 2024-01-08 NOTE — Telephone Encounter (Signed)
 Specialty medication

## 2024-01-08 NOTE — Telephone Encounter (Signed)
 She also states the Brother brought the Pt Assistance Forms before the holidays. As per Ms. Devki's last message saying it was denied, they are thinking this may be a previous approval.   Please call daughter @ [phone] (She is 6 hours ahead of us  right now as she is out of town.)

## 2024-01-09 NOTE — Telephone Encounter (Signed)
 Please see mychart sent by pt and advise.

## 2024-01-11 NOTE — Telephone Encounter (Signed)
 Received approval letter today. Spoke with patient. ATC patient 's daughter but phone went straight to VM and VM box is full. Will mail approval letter to patient's home per her requested  Sherry Pennant, PharmD, MPH, BCPS, CPP Clinical Pharmacist (Rheumatology and Pulmonology)

## 2024-01-11 NOTE — Telephone Encounter (Signed)
 Received a fax from  [facility] [employer]  regarding an approval for NUCALA  patient assistance from 01/10/2024 to 12/28/2024. Approval letter sent to scan center.  Phone #: [phone] Fax #: [phone]  Talked to patient. MyChart message sent and approval letter sent to her home (her daughter and son help manage)  Sherry Pennant, PharmD, MPH, BCPS, CPP Clinical Pharmacist (Rheumatology and Pulmonology)

## 2024-01-14 ENCOUNTER — Ambulatory Visit: Payer: Medicare Other | Admitting: Physician Assistant

## 2024-01-18 ENCOUNTER — Ambulatory Visit: Payer: Self-pay | Admitting: Internal Medicine

## 2024-01-18 ENCOUNTER — Encounter: Payer: Self-pay | Admitting: Internal Medicine

## 2024-01-18 ENCOUNTER — Emergency Department (HOSPITAL_COMMUNITY): Payer: Medicare Other

## 2024-01-18 ENCOUNTER — Other Ambulatory Visit: Payer: Self-pay

## 2024-01-18 ENCOUNTER — Emergency Department (HOSPITAL_COMMUNITY)
Admission: EM | Admit: 2024-01-18 | Discharge: 2024-01-20 | Disposition: A | Discharge status: Home / Self Care | Payer: Medicare Other | Attending: Emergency Medicine | Admitting: Emergency Medicine

## 2024-01-18 ENCOUNTER — Encounter (HOSPITAL_COMMUNITY): Payer: Self-pay

## 2024-01-18 DIAGNOSIS — R443 Hallucinations, unspecified: Secondary | ICD-10-CM | POA: Insufficient documentation

## 2024-01-18 DIAGNOSIS — J449 Chronic obstructive pulmonary disease, unspecified: Secondary | ICD-10-CM | POA: Diagnosis not present

## 2024-01-18 DIAGNOSIS — R52 Pain, unspecified: Secondary | ICD-10-CM | POA: Diagnosis not present

## 2024-01-18 DIAGNOSIS — I672 Cerebral atherosclerosis: Secondary | ICD-10-CM | POA: Diagnosis not present

## 2024-01-18 DIAGNOSIS — F0390 Unspecified dementia without behavioral disturbance: Secondary | ICD-10-CM | POA: Diagnosis not present

## 2024-01-18 DIAGNOSIS — R442 Other hallucinations: Secondary | ICD-10-CM | POA: Diagnosis not present

## 2024-01-18 DIAGNOSIS — R404 Transient alteration of awareness: Secondary | ICD-10-CM | POA: Diagnosis not present

## 2024-01-18 DIAGNOSIS — T300 Burn of unspecified body region, unspecified degree: Secondary | ICD-10-CM | POA: Diagnosis not present

## 2024-01-18 DIAGNOSIS — I10 Essential (primary) hypertension: Secondary | ICD-10-CM | POA: Insufficient documentation

## 2024-01-18 DIAGNOSIS — R0602 Shortness of breath: Secondary | ICD-10-CM | POA: Insufficient documentation

## 2024-01-18 LAB — URINALYSIS, W/ REFLEX TO CULTURE (INFECTION SUSPECTED)
Bacteria, UA: NONE SEEN
Bilirubin Urine: NEGATIVE
Glucose, UA: NEGATIVE mg/dL
Hgb urine dipstick: NEGATIVE
Ketones, ur: NEGATIVE mg/dL
Leukocytes,Ua: NEGATIVE
Nitrite: NEGATIVE
Protein, ur: NEGATIVE mg/dL
Specific Gravity, Urine: 1.02 (ref 1.005–1.030)
pH: 5 (ref 5.0–8.0)

## 2024-01-18 LAB — BLOOD GAS, VENOUS
Acid-Base Excess: 4.2 mmol/L — ABNORMAL HIGH (ref 0.0–2.0)
Bicarbonate: 30.3 mmol/L — ABNORMAL HIGH (ref 20.0–28.0)
O2 Saturation: 68.4 %
Patient temperature: 37
pCO2, Ven: 50 mm[Hg] (ref 44–60)
pH, Ven: 7.39 (ref 7.25–7.43)
pO2, Ven: 40 mm[Hg] (ref 32–45)

## 2024-01-18 LAB — COMPREHENSIVE METABOLIC PANEL
ALT: 17 U/L (ref 0–44)
AST: 29 U/L (ref 15–41)
Albumin: 4.6 g/dL (ref 3.5–5.0)
Alkaline Phosphatase: 43 U/L (ref 38–126)
Anion gap: 12 (ref 5–15)
BUN: 20 mg/dL (ref 8–23)
CO2: 26 mmol/L (ref 22–32)
Calcium: 9.6 mg/dL (ref 8.9–10.3)
Chloride: 105 mmol/L (ref 98–111)
Creatinine, Ser: 0.82 mg/dL (ref 0.44–1.00)
GFR, Estimated: >60 mL/min (ref >60)
Glucose, Bld: 100 mg/dL — ABNORMAL HIGH (ref 70–99)
Potassium: 3.7 mmol/L (ref 3.5–5.1)
Sodium: 143 mmol/L (ref 135–145)
Total Bilirubin: 0.7 mg/dL (ref 0.0–1.2)
Total Protein: 7.7 g/dL (ref 6.5–8.1)

## 2024-01-18 LAB — CBC WITH DIFFERENTIAL/PLATELET
Abs Immature Granulocytes: 0.11 10*3/uL — ABNORMAL HIGH (ref 0.00–0.07)
Basophils Absolute: 0 10*3/uL (ref 0.0–0.1)
Basophils Relative: 0 %
Eosinophils Absolute: 0 10*3/uL (ref 0.0–0.5)
Eosinophils Relative: 0 %
HCT: 36.3 % (ref 36.0–46.0)
Hemoglobin: 13.5 g/dL (ref 12.0–15.0)
Immature Granulocytes: 1 %
Lymphocytes Relative: 9 %
Lymphs Abs: 0.8 10*3/uL (ref 0.7–4.0)
MCH: 31.4 pg (ref 26.0–34.0)
MCHC: 37.2 g/dL — ABNORMAL HIGH (ref 30.0–36.0)
MCV: 84.4 fL (ref 80.0–100.0)
Monocytes Absolute: 0.6 10*3/uL (ref 0.1–1.0)
Monocytes Relative: 6 %
Neutro Abs: 7.8 10*3/uL — ABNORMAL HIGH (ref 1.7–7.7)
Neutrophils Relative %: 84 %
Platelets: 151 10*3/uL (ref 150–400)
RBC: 4.3 MIL/uL (ref 3.87–5.11)
RDW: 13.4 % (ref 11.5–15.5)
WBC: 9.4 10*3/uL (ref 4.0–10.5)
nRBC: 0 % (ref 0.0–0.2)

## 2024-01-18 LAB — RAPID URINE DRUG SCREEN, HOSP PERFORMED
Amphetamines: NOT DETECTED
Barbiturates: NOT DETECTED
Benzodiazepines: NOT DETECTED
Cocaine: NOT DETECTED
Opiates: NOT DETECTED
Tetrahydrocannabinol: NOT DETECTED

## 2024-01-18 LAB — ETHANOL: Alcohol, Ethyl (B): <10 mg/dL (ref <10)

## 2024-01-18 LAB — BRAIN NATRIURETIC PEPTIDE: B Natriuretic Peptide: 213.3 pg/mL — ABNORMAL HIGH (ref 0.0–100.0)

## 2024-01-18 LAB — T4, FREE: Free T4: 0.86 ng/dL (ref 0.61–1.12)

## 2024-01-18 LAB — TSH: TSH: 1.429 u[IU]/mL (ref 0.350–4.500)

## 2024-01-18 LAB — CBG MONITORING, ED: Glucose-Capillary: 95 mg/dL (ref 70–99)

## 2024-01-18 LAB — TROPONIN I (HIGH SENSITIVITY): Troponin I (High Sensitivity): 11 ng/L (ref <18)

## 2024-01-18 MED ORDER — ALBUTEROL SULFATE HFA 108 (90 BASE) MCG/ACT IN AERS
2.0000 | INHALATION_SPRAY | Freq: Four times a day (QID) | RESPIRATORY_TRACT | Status: DC | PRN
  Administered 2024-01-19: 2 via RESPIRATORY_TRACT
  Filled 2024-01-18: qty 6.7

## 2024-01-18 MED ORDER — GABAPENTIN 300 MG PO CAPS
300.0000 mg | ORAL_CAPSULE | Freq: Three times a day (TID) | ORAL | Status: DC
  Administered 2024-01-18 – 2024-01-20 (×7): 300 mg via ORAL
  Filled 2024-01-18 (×7): qty 1

## 2024-01-18 MED ORDER — QUETIAPINE FUMARATE 25 MG PO TABS
12.5000 mg | ORAL_TABLET | Freq: Every day | ORAL | Status: DC
  Administered 2024-01-18 – 2024-01-20 (×3): 12.5 mg via ORAL
  Filled 2024-01-18 (×3): qty 1

## 2024-01-18 MED ORDER — DILTIAZEM HCL ER COATED BEADS 120 MG PO CP24
120.0000 mg | ORAL_CAPSULE | Freq: Every day | ORAL | Status: DC
  Administered 2024-01-18 – 2024-01-20 (×3): 120 mg via ORAL
  Filled 2024-01-18 (×3): qty 1

## 2024-01-18 MED ORDER — QUETIAPINE FUMARATE 50 MG PO TABS
50.0000 mg | ORAL_TABLET | Freq: Every day | ORAL | Status: DC
  Administered 2024-01-18 – 2024-01-19 (×2): 50 mg via ORAL
  Filled 2024-01-18 (×2): qty 1

## 2024-01-18 NOTE — Telephone Encounter (Signed)
Copied from CRM [id number]. Topic: Clinical - Red Word Triage >> Jan 18, 2024  8:26 AM Almira Coaster wrote: Red Word that prompted transfer to Nurse Triage: Patient's daughter is calling to advise that patient is having Hallucinations, swelling at her ankles and feet. Patient was diagnosed with Dementia and she states it is becoming hard to care for the patient. Daughter will be entering a meeting now but would like to speak with a nurse anytime after 9am.   Chief Complaint: Hallucinations, BLE swelling Symptoms: worsening hallucinations and "seen extremely disturbing things," worsening confusion, "red tight shins and ankles with swelling to both feet," "really bad headaches," "seems like her vision is affected" Frequency: continual, progressing Pertinent Negatives: Daughter denies pt harming herself or others Disposition: [x] 911 / [] ED /[] Urgent Care (no appt availability in office) / [] Appointment(In office/virtual)/ []  [facility]/ [] Home Care/ [] Refused Recommended Disposition /[] [facility]/ []  Follow-up with PCP Additional Notes: Daughter reporting that pt has been having "whole next level of hallucinations where starting to do dangerous things to herself, we need to have her assessed for maybe a UTI, just a whole other level of hallucinations." Daughter reporting that pt also has "red tight shins and ankles with swelling to both feet for several days now, really bad headaches and seems like her vision is affected." Daughter reporting that pt's dementia seems to be worsening for past 3-5 days, originally diagnosed around September 2024. Daughter reporting pt is usually cared for by brother who is "permanently disabled" and "cares for her as best" possible. Daughter lives in [place] and happens to be here on visit, daughter and son of pt looking into next steps for pt, "feel like she needs to be in a facility to be managed" or think UTI. Daughter confirms pt seems pretty agitated  especially "last night," worsening confusion, and daughter "feel like she [pt] is not safe." Advised pt be examined in hospital asap via ambulance, offered to call 911 for the family. Daughter verbalized understanding, declines nurse calling 911 at this time, "want to wait for brother" to get home, "on his way right now."  Advised call 911 asap, daughter verbalized understanding. Nurse offered to call daughter in 30 min to ensure able to call EMS, daughter declines at this time, will call back if need.  Reason for Disposition  Severe agitation or behavior problem (e.g., patient endangering self or others)  Answer Assessment - Initial Assessment Questions 1. MAIN CONCERN OR SYMPTOM:  "What is your main concern right now?" "What questions do you have?" "What's the main symptom you're worried about?" (e.g., confusion, memory loss)     Whole next level of hallucinations where starting to do dangerous things to herself, we need to have her assessed for maybe a UTI, just a whole other level of hallucinations, red tight shins and ankles with swelling to both feet for several days now, really bad headaches and seems like her vision is affected 2. ONSET:  "When did the symptom start (or worsen)?" (minutes, hours, days, weeks)     Hallucinating worsening, seen extremely disturbing things animals being cut open, hung, smushed, missing body parts, seeing her dead dog from years ago, seeing people doing disturbing things. 3-5 days ago started to worsen 3. BETTER-SAME-WORSE: "Are you (the patient) getting better, staying the same, or getting worse compared to the day you (they) were diagnosed or most recent hospital discharge ?"     worse 4. DIAGNOSIS: "Was the dementia diagnosed by a doctor?" If  Yes, ask: "When?" (e.g., days, months, years ago)     Yes, September 2024 5. MEDICINES: "Has there been any change in medicines recently?" (e.g., narcotics, antihistamines, benzodiazepines, etc.)     denies 6. OTHER  SYMPTOMS: "Are there any other symptoms?" (e.g., fever, cough, pain, falling)     Not harming herself but bending over and opening imaginary doors, unplugging things from wall, severe insomnia, barricading herself in her room, not supposed to be bending over because fallen in the past 7. SUPPORT: Document living circumstances and support (e.g., family, nursing home)     Permanently disabled son who cares for her as best possible, daughter lives in [place] and happens to be here on visit  Protocols used: Dementia Symptoms and Questions-A-AH

## 2024-01-18 NOTE — ED Triage Notes (Signed)
Pt arrived with EMS. Per EMS, patient family concerned due to recently paranoid and having visual hallucinations. Patient has hx of dementia. AAOX3 at baseline. Calm and cooperative at this time. NAD. Denies any concerns. Per EMS son in route

## 2024-01-18 NOTE — ED Notes (Signed)
Pt. Not available getting scans.

## 2024-01-18 NOTE — ED Notes (Signed)
Attempt to draw pt. Blood pt. Gone to x-ray.

## 2024-01-18 NOTE — ED Provider Notes (Signed)
  Physical Exam  BP 139/86 (BP Location: Left Arm)   Pulse (!) 113   Temp 98.2 F (36.8 C) (Oral)   Resp 20   Ht 5' (1.524 m)   Wt 59.9 kg   SpO2 95%   BMI 25.79 kg/m   Physical Exam  Procedures  Procedures  ED Course / MDM    Medical Decision Making Amount and/or Complexity of Data Reviewed Labs: ordered. Radiology: ordered.  Risk Prescription drug management.   Received in signout.  Mental status changes.  More hallucinations.  Potential diagnosis of Lewy body dementia.  Has been getting worse.  Reportedly unable to manage at home.  Workup overall reassuring.  Does carry some mild peripheral edema but chest x-ray does not show CHF.  Discussed with patient's daughter.  Potentially will need some placement.  Home medicines been ordered.  PT eval placed.  TOC consulted.       Benjiman Core, MD 01/18/24 2228

## 2024-01-18 NOTE — ED Notes (Signed)
Pt. Refused nasal swab

## 2024-01-18 NOTE — ED Notes (Signed)
PT @ CT

## 2024-01-18 NOTE — Telephone Encounter (Signed)
FYI

## 2024-01-18 NOTE — ED Provider Notes (Addendum)
[facility] DE[facility]MENT AT [facility] Provider Note   CSN: [id number] Arrival date & time: 01/18/24  1236     History  Chief Complaint  Patient presents with   Dementia   Hallucinations    MOSELLA Lisa Larson is a 83 y.o. female.  HPI   83 year old female with medical history significant for hypertension, GERD, depression, COPD, dementia presenting to the emergency department with acute on chronic paranoia and hallucinations.  The patient's history was provided by the patient's son due to her dementia.  He states that she has had months of hallucinations outpatient however she has become increasingly paranoid and confused over the past week with ongoing visual hallucinations.  He states that she is being worked up for her dementia outpatient but is uncertain if she has a diagnosis of Lewy body dementia.  She was post to follow-up with a neurologist in clinic tomorrow 1/21.  She has had some swelling in her feet and ankles bilaterally, no cough, shortness of breath, no complaint of chest pain.  The remainder of HPI was limited by the patient's dementia.  Home Medications Prior to Admission medications   Medication Sig Start Date End Date Taking? Authorizing Provider  acetaminophen (TYLENOL) 325 MG tablet Take 650 mg by mouth 3 (three) times a week.    [provider]  albuterol (PROAIR HFA) 108 (90 Base) MCG/ACT inhaler Inhale 2 puffs into the lungs every 6 (six) hours as needed for wheezing or shortness of breath. 09/01/23   [name], MD  calcium carbonate (OS-CAL) 600 MG tablet Take 600 mg by mouth 2 (two) times daily.    [provider]  Cholecalciferol (VITAMIN D3) 2000 UNITS capsule Take 2,000 Units by mouth daily.    [provider]  diltiazem (CARDIZEM CD) 120 MG 24 hr capsule Take 1 capsule (120 mg total) by mouth daily. 12/16/23   Tobb, Kardie, DO  gabapentin (NEURONTIN) 300 MG capsule TAKE ONE CAPSULE BY MOUTH THREE TIMES DAILY  12/21/23   Pincus Sanes, MD  LORazepam (ATIVAN) 0.5 MG tablet Take 0.5 tablets (0.25 mg total) by mouth daily. Patient not taking: Reported on 12/16/2023 09/08/23   Pincus Sanes, MD  Mepolizumab (NUCALA) 100 MG/ML SOAJ Inject 1 mL (100 mg total) into the skin every 28 (twenty-eight) days. Deliver to patient's home for self-administration. 07/21/23   [name], MD  predniSONE (DELTASONE) 5 MG tablet Take 1 tablet (5 mg total) by mouth daily with breakfast. 01/13/23   Shamleffer, Konrad Dolores, MD  QUEtiapine (SEROQUEL) 25 MG tablet Take 0.5 tablets (12.5 mg total) by mouth daily. 11/11/23   Pincus Sanes, MD  QUEtiapine (SEROQUEL) 50 MG tablet Take 1 tablet (50 mg total) by mouth at bedtime. 12/21/23   Corwin Levins, MD  sertraline (ZOLOFT) 100 MG tablet TAKE TWO TABLETS BY MOUTH ONCE DAILY FOR ANXIETY 11/30/23   Pincus Sanes, MD      Allergies    Other, Sulfonamide derivatives, Oxycodone-aspirin, Diclofenac, Oxycodone, Prolia [denosumab], Rofecoxib, Sulfa antibiotics, Ace inhibitors, Benazepril hcl, and Tramadol    Review of Systems   Review of Systems  Unable to perform ROS: Dementia    Physical Exam Updated Vital Signs BP 136/75   Pulse (!) 103   Temp 98.3 F (36.8 C) (Oral)   Resp 19   Ht 5' (1.524 m)   Wt 59.9 kg   SpO2 96%   BMI 25.79 kg/m  Physical Exam Vitals and nursing note reviewed.  Constitutional:  General: She is not in acute distress.    Appearance: She is well-developed.     Comments: GCS 14  HENT:     Head: Normocephalic and atraumatic.  Eyes:     Conjunctiva/sclera: Conjunctivae normal.  Cardiovascular:     Rate and Rhythm: Normal rate and regular rhythm.  Pulmonary:     Effort: Pulmonary effort is normal. No respiratory distress.     Breath sounds: Normal breath sounds.  Abdominal:     Palpations: Abdomen is soft.     Tenderness: There is no abdominal tenderness.  Musculoskeletal:     Cervical back: Neck supple.     Comments: Trace  bilateral LE swelling  Skin:    General: Skin is warm and dry.     Capillary Refill: Capillary refill takes less than 2 seconds.  Neurological:     Mental Status: She is alert.     Comments: MENTAL STATUS EXAM:    Orientation: Disoriented Memory: Cooperative, follows commands well.  Language: Speech is clear and language is normal.   CRANIAL NERVES:    CN 2 (Optic): Visual fields intact to confrontation.  CN 3,4,6 (EOM): Pupils equal and reactive to light. Full extraocular eye movement without nystagmus.  CN 5 (Trigeminal): Facial sensation is normal, no weakness of masticatory muscles.  CN 7 (Facial): No facial weakness or asymmetry.  CN 8 (Auditory): Auditory acuity grossly normal.  CN 9,10 (Glossophar): The uvula is midline, the palate elevates symmetrically.  CN 11 (spinal access): Normal sternocleidomastoid and trapezius strength.  CN 12 (Hypoglossal): The tongue is midline. No atrophy or fasciculations.Marland Kitchen   MOTOR:  Muscle Strength: 5/5RUE, 5/5LUE, 5/5RLE, 5/5LLE.   COORDINATION:   No tremor.   SENSATION:   Intact to light touch all four extremities.     Psychiatric:        Mood and Affect: Mood normal.     ED Results / Procedures / Treatments   Labs (all labs ordered are listed, but only abnormal results are displayed) Labs Reviewed  COMPREHENSIVE METABOLIC PANEL - Abnormal; Notable for the following components:      Result Value   Glucose, Bld 100 (*)    All other components within normal limits  CBC WITH DIFFERENTIAL/PLATELET - Abnormal; Notable for the following components:   MCHC 37.2 (*)    Neutro Abs 7.8 (*)    Abs Immature Granulocytes 0.11 (*)    All other components within normal limits  BLOOD GAS, VENOUS - Abnormal; Notable for the following components:   Bicarbonate 30.3 (*)    Acid-Base Excess 4.2 (*)    All other components within normal limits  RESP PANEL BY RT-PCR (RSV, FLU A&B, COVID)  RVPGX2  RAPID URINE DRUG SCREEN, HOSP PERFORMED  ETHANOL   URINALYSIS, W/ REFLEX TO CULTURE (INFECTION SUSPECTED)  TSH  BRAIN NATRIURETIC PEPTIDE  T4, FREE  CBG MONITORING, ED  TROPONIN I (HIGH SENSITIVITY)    EKG EKG Interpretation Date/Time:  Monday January 18 2024 14:59:42 EST Ventricular Rate:  113 PR Interval:    QRS Duration:  86 QT Interval:  351 QTC Calculation: 486 R Axis:   95  Text Interpretation: Atrial fibrillation with RVR Right axis deviation Borderline repolarization abnormality Borderline prolonged QT interval Confirmed by Ernie Avena ([id number]) on 01/18/2024 4:49:27 PM  Radiology DG Chest 2 View Result Date: 01/18/2024 CLINICAL DATA:  Altered level of consciousness EXAM: CHEST - 2 VIEW COMPARISON:  07/16/2023 FINDINGS: Frontal and lateral views of the chest demonstrate mild enlargement of the  cardiac silhouette. No acute airspace disease, effusion, or pneumothorax. Degenerative changes of the bilateral shoulders, left greater than right. No acute bony abnormality. IMPRESSION: 1. No acute intrathoracic process. 2. Enlarged cardiac silhouette. Electronically Signed   By: Sharlet Salina M.D.   On: 01/18/2024 15:14   CT HEAD WO CONTRAST Result Date: 01/18/2024 CLINICAL DATA:  Altered level of consciousness, hallucinations, history of dementia EXAM: CT HEAD WITHOUT CONTRAST TECHNIQUE: Contiguous axial images were obtained from the base of the skull through the vertex without intravenous contrast. RADIATION DOSE REDUCTION: This exam was performed according to the departmental dose-optimization program which includes automated exposure control, adjustment of the mA and/or kV according to patient size and/or use of iterative reconstruction technique. COMPARISON:  03/05/2022 FINDINGS: Brain: No acute infarct or hemorrhage. Stable scattered hypodensities throughout the periventricular white matter consistent with chronic small vessel ischemic changes. Lateral ventricles and midline structures are unremarkable. No acute extra-axial fluid  collections. No mass effect. Vascular: No hyperdense vessel or unexpected calcification. Stable atherosclerosis. Skull: Normal. Negative for fracture or focal lesion. Sinuses/Orbits: Chronic thickening of the bony walls of the bilateral maxillary sinus consistent with chronic sinus disease. There is no mucosal thickening at this time. There is a small gas fluid level and retained secretions within the right sphenoid sinus, stable. Other: None. IMPRESSION: 1. No acute intracranial process. 2. 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 Electronically Signed   By: Sharlet Salina M.D.   On: 01/18/2024 15:14    Procedures Procedures    Medications Ordered in ED Medications  diltiazem (CARDIZEM CD) 24 hr capsule 120 mg (has no administration in time range)    ED Course/ Medical Decision Making/ A&P                                 Medical Decision Making Amount and/or Complexity of Data Reviewed Labs: ordered. Radiology: ordered.  Risk Prescription drug management.   83 year old female with medical history significant for hypertension, GERD, depression, COPD, dementia presenting to the emergency department with acute on chronic paranoia and hallucinations.  The patient's history was provided by the patient's son due to her dementia.  He states that she has had months of hallucinations outpatient however she has become increasingly paranoid and confused over the past week with ongoing visual hallucinations.  He states that she is being worked up for her dementia outpatient but is uncertain if she has a diagnosis of Lewy body dementia.  She was post to follow-up with a neurologist in clinic tomorrow 1/21.  She has had some swelling in her feet and ankles bilaterally, no cough, shortness of breath, no complaint of chest pain.  The remainder of HPI was limited by the patient's dementia.  Medical Decision Making:   LAILEE BUTTOLPH is a 83 y.o. female who presented to the ED  today with altered mental status detailed above.    Patient's presentation is complicated by their history of dementia.  Patient placed on continuous vitals and telemetry monitoring while in ED which was reviewed periodically.  Complete initial physical exam performed, notably the patient  was neuro intact, GCS 14.    Reviewed and confirmed nursing documentation for past medical history, family history, social history.    Initial Assessment:   With the patient's presentation of altered mental status, most likely diagnosis is delerium 2/2 infectious etiology (UTI/CAP/URI) vs metabolic abnormality (Na/K/Mg/Ca) vs nonspecific etiology. Other diagnoses were  considered including (but not limited to) CVA, ICH, intracranial mass, critical dehydration, heptatic dysfunction, uremia, hypercarbia, intoxication, endrocrine abnormality, toxidrome. These are considered less likely due to history of present illness and physical exam findings.   This is most consistent with an acute life/limb threatening illness complicated by underlying chronic conditions.   Also considered worsening dementia with potential concern for Lewy Body dementia given hallucinations.  Initial Plan:  CTH to evaluate for intracranial etiology of patient's symptoms  Screening labs including CBC and Metabolic panel to evaluate for infectious or metabolic etiology of disease.  Urinalysis with reflex culture ordered to evaluate for UTI or relevant urologic/nephrologic pathology.  CXR to evaluate for structural/infectious intrathoracic pathology.  TSH for evaluation for endrocrine etiology VBG for acid/base status and further toxidrome evaulation EKG to evaluate for cardiac pathology, BNP and cardiac troponin for mild swelling Objective evaluation as below reviewed   Initial Study Results:   Laboratory  All laboratory results reviewed without evidence of clinically relevant pathology.   Exceptions include: none   EKG EKG was reviewed  independently. Rate, rhythm, axis, intervals all examined with evidence of atrial fibrillation with RVR.  Radiology:  All images reviewed independently. Agree with radiology report at this time.   DG Chest 2 View Result Date: 01/18/2024 CLINICAL DATA:  Altered level of consciousness EXAM: CHEST - 2 VIEW COMPARISON:  07/16/2023 FINDINGS: Frontal and lateral views of the chest demonstrate mild enlargement of the cardiac silhouette. No acute airspace disease, effusion, or pneumothorax. Degenerative changes of the bilateral shoulders, left greater than right. No acute bony abnormality. IMPRESSION: 1. No acute intrathoracic process. 2. Enlarged cardiac silhouette. Electronically Signed   By: Sharlet Salina M.D.   On: 01/18/2024 15:14   CT HEAD WO CONTRAST Result Date: 01/18/2024 CLINICAL DATA:  Altered level of consciousness, hallucinations, history of dementia EXAM: CT HEAD WITHOUT CONTRAST TECHNIQUE: Contiguous axial images were obtained from the base of the skull through the vertex without intravenous contrast. RADIATION DOSE REDUCTION: This exam was performed according to the departmental dose-optimization program which includes automated exposure control, adjustment of the mA and/or kV according to patient size and/or use of iterative reconstruction technique. COMPARISON:  03/05/2022 FINDINGS: Brain: No acute infarct or hemorrhage. Stable scattered hypodensities throughout the periventricular white matter consistent with chronic small vessel ischemic changes. Lateral ventricles and midline structures are unremarkable. No acute extra-axial fluid collections. No mass effect. Vascular: No hyperdense vessel or unexpected calcification. Stable atherosclerosis. Skull: Normal. Negative for fracture or focal lesion. Sinuses/Orbits: Chronic thickening of the bony walls of the bilateral maxillary sinus consistent with chronic sinus disease. There is no mucosal thickening at this time. There is a small gas fluid level  and retained secretions within the right sphenoid sinus, stable. Other: None. IMPRESSION: 1. No acute intracranial process. 2. 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 Electronically Signed   By: Sharlet Salina M.D.   On: 01/18/2024 15:14       Final Assessment and Plan:   Pt urinalysis pending at time of signout. In rapid afib on EKG however on exam and on tele rate controlled in the 100s, missed her home Cardizen which was ordered. Pt son states he can no longer care for her in the home. UA was negative for UTI. Home meds re-ordered.  In the setting of the patient's worsening dementia, inability for family to care for her in the home, PT/OT consult was placed and the patient was made a border.  Final Clinical Impression(s) /  ED Diagnoses Final diagnoses:  Hallucinations  Dementia, unspecified dementia severity, unspecified dementia type, unspecified whether behavioral, psychotic, or mood disturbance or anxiety [facility])    Rx / DC Orders ED Discharge Orders     None         Ernie Avena, MD 01/18/24 1652    Ernie Avena, MD 01/18/24 1706

## 2024-01-19 ENCOUNTER — Ambulatory Visit: Payer: Medicare Other | Admitting: Physician Assistant

## 2024-01-19 ENCOUNTER — Telehealth: Payer: Medicare Other | Admitting: Internal Medicine

## 2024-01-19 ENCOUNTER — Encounter: Payer: Self-pay | Admitting: Physician Assistant

## 2024-01-19 DIAGNOSIS — F32A Depression, unspecified: Secondary | ICD-10-CM | POA: Diagnosis not present

## 2024-01-19 DIAGNOSIS — E872 Acidosis, unspecified: Secondary | ICD-10-CM | POA: Diagnosis not present

## 2024-01-19 DIAGNOSIS — G9349 Other encephalopathy: Secondary | ICD-10-CM | POA: Diagnosis not present

## 2024-01-19 DIAGNOSIS — M6281 Muscle weakness (generalized): Secondary | ICD-10-CM | POA: Diagnosis not present

## 2024-01-19 DIAGNOSIS — K219 Gastro-esophageal reflux disease without esophagitis: Secondary | ICD-10-CM | POA: Diagnosis not present

## 2024-01-19 DIAGNOSIS — I517 Cardiomegaly: Secondary | ICD-10-CM | POA: Diagnosis not present

## 2024-01-19 DIAGNOSIS — M51369 Other intervertebral disc degeneration, lumbar region without mention of lumbar back pain or lower extremity pain: Secondary | ICD-10-CM | POA: Diagnosis not present

## 2024-01-19 DIAGNOSIS — A419 Sepsis, unspecified organism: Secondary | ICD-10-CM | POA: Diagnosis not present

## 2024-01-19 DIAGNOSIS — Z7952 Long term (current) use of systemic steroids: Secondary | ICD-10-CM | POA: Diagnosis not present

## 2024-01-19 DIAGNOSIS — J9601 Acute respiratory failure with hypoxia: Secondary | ICD-10-CM | POA: Diagnosis not present

## 2024-01-19 DIAGNOSIS — E2740 Unspecified adrenocortical insufficiency: Secondary | ICD-10-CM | POA: Diagnosis present

## 2024-01-19 DIAGNOSIS — I4891 Unspecified atrial fibrillation: Secondary | ICD-10-CM | POA: Diagnosis not present

## 2024-01-19 DIAGNOSIS — D649 Anemia, unspecified: Secondary | ICD-10-CM | POA: Diagnosis not present

## 2024-01-19 DIAGNOSIS — J441 Chronic obstructive pulmonary disease with (acute) exacerbation: Secondary | ICD-10-CM | POA: Diagnosis not present

## 2024-01-19 DIAGNOSIS — I4811 Longstanding persistent atrial fibrillation: Secondary | ICD-10-CM | POA: Diagnosis present

## 2024-01-19 DIAGNOSIS — E785 Hyperlipidemia, unspecified: Secondary | ICD-10-CM | POA: Diagnosis not present

## 2024-01-19 DIAGNOSIS — J4489 Other specified chronic obstructive pulmonary disease: Secondary | ICD-10-CM | POA: Diagnosis not present

## 2024-01-19 DIAGNOSIS — J111 Influenza due to unidentified influenza virus with other respiratory manifestations: Secondary | ICD-10-CM | POA: Diagnosis not present

## 2024-01-19 DIAGNOSIS — R000 Tachycardia, unspecified: Secondary | ICD-10-CM | POA: Diagnosis not present

## 2024-01-19 DIAGNOSIS — G4700 Insomnia, unspecified: Secondary | ICD-10-CM | POA: Diagnosis not present

## 2024-01-19 DIAGNOSIS — F411 Generalized anxiety disorder: Secondary | ICD-10-CM | POA: Diagnosis not present

## 2024-01-19 DIAGNOSIS — I959 Hypotension, unspecified: Secondary | ICD-10-CM | POA: Diagnosis not present

## 2024-01-19 DIAGNOSIS — I5020 Unspecified systolic (congestive) heart failure: Secondary | ICD-10-CM | POA: Diagnosis not present

## 2024-01-19 DIAGNOSIS — I480 Paroxysmal atrial fibrillation: Secondary | ICD-10-CM | POA: Diagnosis not present

## 2024-01-19 DIAGNOSIS — I214 Non-ST elevation (NSTEMI) myocardial infarction: Secondary | ICD-10-CM | POA: Diagnosis not present

## 2024-01-19 DIAGNOSIS — R262 Difficulty in walking, not elsewhere classified: Secondary | ICD-10-CM | POA: Diagnosis not present

## 2024-01-19 DIAGNOSIS — F0283 Dementia in other diseases classified elsewhere, unspecified severity, with mood disturbance: Secondary | ICD-10-CM | POA: Diagnosis present

## 2024-01-19 DIAGNOSIS — F0393 Unspecified dementia, unspecified severity, with mood disturbance: Secondary | ICD-10-CM | POA: Diagnosis not present

## 2024-01-19 DIAGNOSIS — Z79899 Other long term (current) drug therapy: Secondary | ICD-10-CM | POA: Diagnosis not present

## 2024-01-19 DIAGNOSIS — E2749 Other adrenocortical insufficiency: Secondary | ICD-10-CM | POA: Diagnosis not present

## 2024-01-19 DIAGNOSIS — I4819 Other persistent atrial fibrillation: Secondary | ICD-10-CM | POA: Diagnosis not present

## 2024-01-19 DIAGNOSIS — Z66 Do not resuscitate: Secondary | ICD-10-CM | POA: Diagnosis present

## 2024-01-19 DIAGNOSIS — Z1152 Encounter for screening for COVID-19: Secondary | ICD-10-CM | POA: Diagnosis not present

## 2024-01-19 DIAGNOSIS — A4189 Other specified sepsis: Secondary | ICD-10-CM | POA: Diagnosis present

## 2024-01-19 DIAGNOSIS — R0902 Hypoxemia: Secondary | ICD-10-CM | POA: Diagnosis not present

## 2024-01-19 DIAGNOSIS — F02811 Dementia in other diseases classified elsewhere, unspecified severity, with agitation: Secondary | ICD-10-CM | POA: Diagnosis not present

## 2024-01-19 DIAGNOSIS — N179 Acute kidney failure, unspecified: Secondary | ICD-10-CM | POA: Diagnosis not present

## 2024-01-19 DIAGNOSIS — K7200 Acute and subacute hepatic failure without coma: Secondary | ICD-10-CM | POA: Diagnosis not present

## 2024-01-19 DIAGNOSIS — M150 Primary generalized (osteo)arthritis: Secondary | ICD-10-CM | POA: Diagnosis not present

## 2024-01-19 DIAGNOSIS — R6520 Severe sepsis without septic shock: Secondary | ICD-10-CM | POA: Diagnosis not present

## 2024-01-19 DIAGNOSIS — J4550 Severe persistent asthma, uncomplicated: Secondary | ICD-10-CM | POA: Diagnosis not present

## 2024-01-19 DIAGNOSIS — G3183 Dementia with Lewy bodies: Secondary | ICD-10-CM | POA: Diagnosis not present

## 2024-01-19 DIAGNOSIS — E874 Mixed disorder of acid-base balance: Secondary | ICD-10-CM | POA: Diagnosis present

## 2024-01-19 DIAGNOSIS — Z7189 Other specified counseling: Secondary | ICD-10-CM | POA: Diagnosis not present

## 2024-01-19 DIAGNOSIS — F02B2 Dementia in other diseases classified elsewhere, moderate, with psychotic disturbance: Secondary | ICD-10-CM | POA: Diagnosis not present

## 2024-01-19 DIAGNOSIS — Z515 Encounter for palliative care: Secondary | ICD-10-CM | POA: Diagnosis not present

## 2024-01-19 DIAGNOSIS — R7401 Elevation of levels of liver transaminase levels: Secondary | ICD-10-CM | POA: Diagnosis not present

## 2024-01-19 DIAGNOSIS — J101 Influenza due to other identified influenza virus with other respiratory manifestations: Secondary | ICD-10-CM | POA: Diagnosis not present

## 2024-01-19 DIAGNOSIS — I3139 Other pericardial effusion (noninflammatory): Secondary | ICD-10-CM | POA: Diagnosis present

## 2024-01-19 DIAGNOSIS — F0282 Dementia in other diseases classified elsewhere, unspecified severity, with psychotic disturbance: Secondary | ICD-10-CM | POA: Diagnosis present

## 2024-01-19 DIAGNOSIS — I2489 Other forms of acute ischemic heart disease: Secondary | ICD-10-CM | POA: Diagnosis present

## 2024-01-19 DIAGNOSIS — E46 Unspecified protein-calorie malnutrition: Secondary | ICD-10-CM | POA: Diagnosis not present

## 2024-01-19 DIAGNOSIS — R0602 Shortness of breath: Secondary | ICD-10-CM | POA: Diagnosis not present

## 2024-01-19 DIAGNOSIS — F0394 Unspecified dementia, unspecified severity, with anxiety: Secondary | ICD-10-CM | POA: Diagnosis not present

## 2024-01-19 DIAGNOSIS — Z87891 Personal history of nicotine dependence: Secondary | ICD-10-CM | POA: Diagnosis not present

## 2024-01-19 DIAGNOSIS — J1000 Influenza due to other identified influenza virus with unspecified type of pneumonia: Secondary | ICD-10-CM | POA: Diagnosis not present

## 2024-01-19 DIAGNOSIS — M109 Gout, unspecified: Secondary | ICD-10-CM | POA: Diagnosis not present

## 2024-01-19 DIAGNOSIS — I10 Essential (primary) hypertension: Secondary | ICD-10-CM | POA: Diagnosis present

## 2024-01-19 DIAGNOSIS — R4182 Altered mental status, unspecified: Secondary | ICD-10-CM | POA: Diagnosis not present

## 2024-01-19 DIAGNOSIS — Z961 Presence of intraocular lens: Secondary | ICD-10-CM | POA: Diagnosis not present

## 2024-01-19 DIAGNOSIS — R6521 Severe sepsis with septic shock: Secondary | ICD-10-CM | POA: Diagnosis not present

## 2024-01-19 DIAGNOSIS — Z556 Problems related to health literacy: Secondary | ICD-10-CM | POA: Diagnosis not present

## 2024-01-19 DIAGNOSIS — Z7401 Bed confinement status: Secondary | ICD-10-CM | POA: Diagnosis not present

## 2024-01-19 DIAGNOSIS — R41841 Cognitive communication deficit: Secondary | ICD-10-CM | POA: Diagnosis not present

## 2024-01-19 DIAGNOSIS — F22 Delusional disorders: Secondary | ICD-10-CM | POA: Diagnosis not present

## 2024-01-19 DIAGNOSIS — J449 Chronic obstructive pulmonary disease, unspecified: Secondary | ICD-10-CM | POA: Diagnosis not present

## 2024-01-19 DIAGNOSIS — G9341 Metabolic encephalopathy: Secondary | ICD-10-CM | POA: Diagnosis present

## 2024-01-19 DIAGNOSIS — R519 Headache, unspecified: Secondary | ICD-10-CM | POA: Diagnosis not present

## 2024-01-19 DIAGNOSIS — R931 Abnormal findings on diagnostic imaging of heart and coronary circulation: Secondary | ICD-10-CM | POA: Diagnosis not present

## 2024-01-19 DIAGNOSIS — R069 Unspecified abnormalities of breathing: Secondary | ICD-10-CM | POA: Diagnosis not present

## 2024-01-19 DIAGNOSIS — I4510 Unspecified right bundle-branch block: Secondary | ICD-10-CM | POA: Diagnosis not present

## 2024-01-19 DIAGNOSIS — J8281 Chronic eosinophilic pneumonia: Secondary | ICD-10-CM | POA: Diagnosis not present

## 2024-01-19 DIAGNOSIS — J45998 Other asthma: Secondary | ICD-10-CM | POA: Diagnosis not present

## 2024-01-19 DIAGNOSIS — J09X1 Influenza due to identified novel influenza A virus with pneumonia: Secondary | ICD-10-CM | POA: Diagnosis not present

## 2024-01-19 DIAGNOSIS — J8283 Eosinophilic asthma: Secondary | ICD-10-CM | POA: Diagnosis present

## 2024-01-19 DIAGNOSIS — M810 Age-related osteoporosis without current pathological fracture: Secondary | ICD-10-CM | POA: Diagnosis not present

## 2024-01-19 DIAGNOSIS — R0609 Other forms of dyspnea: Secondary | ICD-10-CM | POA: Diagnosis not present

## 2024-01-19 DIAGNOSIS — J1001 Influenza due to other identified influenza virus with the same other identified influenza virus pneumonia: Secondary | ICD-10-CM | POA: Diagnosis present

## 2024-01-19 MED ORDER — ACETAMINOPHEN 500 MG PO TABS
1000.0000 mg | ORAL_TABLET | Freq: Three times a day (TID) | ORAL | Status: DC | PRN
  Administered 2024-01-19 – 2024-01-20 (×3): 1000 mg via ORAL
  Filled 2024-01-19 (×3): qty 2

## 2024-01-19 NOTE — Evaluation (Signed)
Physical Therapy Evaluation Patient Details Name: Lisa Larson MRN: [id number] DOB: Jul 02, 1941 Today's Date: 01/19/2024  History of Present Illness  83 year old female with medical history significant for hypertension, GERD, depression, COPD, C 7 fracture, R hip ORIF, afib, HA, depression/anxiety ,dementia presenting to the emergency department 01/18/24  with acute on chronic paranoia and hallucinations.  Clinical Impression  Pt admitted with above diagnosis.  Pt currently with functional limitations due to the deficits listed below (see PT Problem List). Pt will benefit from acute skilled PT to increase their independence and safety with mobility to allow discharge.       The patient is pleasantly very confused,  frequent hallucinations, son present  to provide history.  Patient lives alone  , uses RW at baseline, with frequent checks from  son.  Patient requires multimodal cues  for  activity, ambulated x 50' using RW. Patient will benefit from continued inpatient follow up therapy, <3 hours/day.    If plan is discharge home, recommend the following: A little help with walking and/or transfers;A lot of help with bathing/dressing/bathroom;Assistance with cooking/housework;Direct supervision/assist for medications management;Assist for transportation;Help with stairs or ramp for entrance   Can travel by private vehicle   Yes    Equipment Recommendations None recommended by PT  Recommendations for Other Services       Functional Status Assessment Patient has had a recent decline in their functional status and demonstrates the ability to make significant improvements in function in a reasonable and predictable amount of time.     Precautions / Restrictions Precautions Precautions: Fall Restrictions Weight Bearing Restrictions Per Provider Order: No      Mobility  Bed Mobility Overal bed mobility: Needs Assistance Bed Mobility: Rolling, Supine to Sit, Sit to Supine Rolling:  +2 for safety/equipment, +2 for physical assistance, Max assist   Supine to sit: Max assist, +2 for physical assistance, +2 for safety/equipment Sit to supine: Max assist, +2 for physical assistance, +2 for safety/equipment   General bed mobility comments: attempted to have patient roll to the side  but patient stated that right hip was painful, +2  assist to  move legs and assist to sit upright , use of bed linens to, total to return to supine, support    Transfers Overall transfer level: Needs assistance Equipment used: Rolling walker (2 wheels) Transfers: Sit to/from Stand Sit to Stand: +2 safety/equipment, Mod assist           General transfer comment: patient is  short ans stature, slid  off stretcher  to stand, Required max of 2 to  return to supine, support head as patient could not  sit onto stretcher.    Ambulation/Gait Ambulation/Gait assistance: Min assist Gait Distance (Feet): 50 Feet Assistive device: Rolling walker (2 wheels) Gait Pattern/deviations: Step-through pattern Gait velocity: decr     General Gait Details: multimodal cues for  position inside RW, Cues to focus on the  activity, patient talking constantly and hallucinating.  Stairs            Wheelchair Mobility     Tilt Bed    Modified Rankin (Stroke Patients Only)       Balance Overall balance assessment: Needs assistance, History of Falls Sitting-balance support: Bilateral upper extremity supported, Feet unsupported Sitting balance-Leahy Scale: Fair     Standing balance support: Bilateral upper extremity supported, Reliant on assistive device for balance, During functional activity Standing balance-Leahy Scale: Poor Standing balance comment: reliant on RW  Pertinent Vitals/Pain Pain Assessment Pain Assessment: Faces Faces Pain Scale: Hurts whole lot Pain Location: neck and HA Pain Descriptors / Indicators: Aching Pain Intervention(s):  Monitored during session, Repositioned    Home Living Family/patient expects to be discharged to:: Private residence Living Arrangements: Alone Available Help at Discharge: Available PRN/intermittently Type of Home: House Home Access: Ramped entrance       Home Layout: One level Home Equipment: Agricultural consultant (2 wheels);Cane - single point;Shower seat Additional Comments: son checks on pt, several times a day    Prior Function Prior Level of Function : Needs assist             Mobility Comments: uses rollator at baselune ADLs Comments: does not cook,     Extremity/Trunk Assessment        Lower Extremity Assessment Lower Extremity Assessment: Generalized weakness    Cervical / Trunk Assessment Cervical / Trunk Assessment: Kyphotic;Other exceptions Cervical / Trunk Exceptions: holds head with hands when lieing down and sitting up  Communication   Communication Communication: No apparent difficulties Cueing Techniques: Verbal cues;Tactile cues;Visual cues  Cognition Arousal: Alert   Overall Cognitive Status: History of cognitive impairments - at baseline                                 General Comments: requires frequent redirection to follow  activity. Patient with frequent hallucinations, asking to not be given hot rocks. Patient does state that she has dementia, states that [facility] is a terrible place now(knows in  [facility])        General Comments      Exercises     Assessment/Plan    PT Assessment Patient needs continued PT services  PT Problem List Decreased strength;Decreased cognition;Decreased knowledge of precautions;Decreased mobility;Decreased range of motion;Decreased activity tolerance;Pain;Decreased safety awareness       PT Treatment Interventions DME instruction;Therapeutic activities;Cognitive remediation;Gait training;Therapeutic exercise;Patient/family education;Functional mobility training    PT Goals (Current goals can be found  in the Care Plan section)  Acute Rehab PT Goals Patient Stated Goal: to not have hallucinations and return to her baseline-per son PT Goal Formulation: With family Time For Goal Achievement: 02/02/24 Potential to Achieve Goals: Fair    Frequency Min 1X/week     Co-evaluation PT/OT/SLP Co-Evaluation/Treatment: Yes Reason for Co-Treatment: Necessary to address cognition/behavior during functional activity;For patient/therapist safety;To address functional/ADL transfers PT goals addressed during session: Mobility/safety with mobility OT goals addressed during session: ADL's and self-care       AM-PAC PT "6 Clicks" Mobility  Outcome Measure Help needed turning from your back to your side while in a flat bed without using bedrails?: Total Help needed moving from lying on your back to sitting on the side of a flat bed without using bedrails?: Total Help needed moving to and from a bed to a chair (including a wheelchair)?: Total Help needed standing up from a chair using your arms (e.g., wheelchair or bedside chair)?: A Lot Help needed to walk in hospital room?: A Lot Help needed climbing 3-5 steps with a railing? : Total 6 Click Score: 8    End of Session Equipment Utilized During Treatment: Gait belt Activity Tolerance: Patient tolerated treatment well Patient left: in bed;with call bell/phone within reach;with bed alarm set;with family/visitor present Nurse Communication: Mobility status PT Visit Diagnosis: Unsteadiness on feet (R26.81);Difficulty in walking, not elsewhere classified (R26.2);Pain Pain - Right/Left: Right Pain - part of body: Hip  Time: 7425-9563 PT Time Calculation (min) (ACUTE ONLY): 19 min   Charges:   PT Evaluation $PT Eval Low Complexity: 1 Low   PT General Charges $$ ACUTE PT VISIT: 1 Visit         Blanchard Kelch PT Acute Rehabilitation Services Office [phone] Weekend [phone]   Rada Hay 01/19/2024, 9:30 AM

## 2024-01-19 NOTE — Progress Notes (Signed)
Awaiting PT.  

## 2024-01-19 NOTE — [place] FL2 (Signed)
[place] MEDICAID FL2 LEVEL OF CARE FORM     IDENTIFICATION  Patient Name: Lisa Larson Birthdate: Mar 14, 1941 Sex: female Admission Date (Current Location): 01/18/2024  [facility] and [place] Number:  Producer, television/film/video and Address:  [facility],  [address], [place] [address]      Provider Number: [id number]  Attending Physician Name and Address:  Beckey Downing, MD  Relative Name and Phone Number:  Sherye, Janis (Daughter)  [phone]    Current Level of Care: Hospital Recommended Level of Care: Skilled Nursing Facility Prior Approval Number:    Date Approved/Denied:   PASRR Number: [id number]  Discharge Plan: SNF    Current Diagnoses: Patient Active Problem List   Diagnosis Date Noted   Headache 11/11/2023   Alzheimer's dementia with behavioral disturbance (HCC) 10/06/2023   Altered mental status 09/08/2023   Lower back pain 07/16/2023   Fall 07/16/2023   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   Dental abscess 07/03/2022   Closed femur fracture (HCC) 03/03/2022   Atrial fibrillation with RVR (HCC) 03/03/2022   Severe persistent asthma 02/24/2021   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   Urinary frequency 05/26/2020   Urinary incontinence 05/25/2020   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   Lightheadedness 07/09/2018   Osteoporosis 12/22/2016   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   Family history of diabetes mellitus 09/22/2016   Depression 03/24/2015   Spine pain, multilevel 02/06/2015   Closed fracture of odontoid process of axis (HCC) 02/02/2015   Allergic rhinitis 04/17/2011   Hyperglycemia 11/28/2010   Asthma, persistent controlled 11/28/2010   GOUT, UNSPECIFIED 05/04/2009   Anxiety 09/29/2008   Essential hypertension 08/09/2008   GERD 09/29/2007   Osteoarthritis, diffuse 06/09/2007    Orientation RESPIRATION BLADDER Height & Weight     Self  Normal Incontinent Weight: 132 lb 0.9 oz (59.9 kg) Height:   5' (152.4 cm)  BEHAVIORAL SYMPTOMS/MOOD NEUROLOGICAL BOWEL NUTRITION STATUS      Incontinent Diet (Regular)  AMBULATORY STATUS COMMUNICATION OF NEEDS Skin   Limited Assist Verbally Normal                       Personal Care Assistance Level of Assistance  Bathing, Feeding, Dressing Bathing Assistance: Limited assistance Feeding assistance: Limited assistance Dressing Assistance: Limited assistance     Functional Limitations Info  Sight, Hearing, Speech Sight Info: Adequate Hearing Info: Adequate Speech Info: Adequate    SPECIAL CARE FACTORS FREQUENCY  PT (By licensed PT), OT (By licensed OT)     PT Frequency: x5/week OT Frequency: x5/week            Contractures Contractures Info: Not present    Additional Factors Info  Code Status, Allergies Code Status Info: Full Allergies Info: Diclofenac  Other  Oxycodone  Rofecoxib  Sulfa Antibiotics  Sulfonamide Derivatives  Oxycodone-aspirin  Prolia (Denosumab)  Ace Inhibitors  Benazepril Hcl  Tramadol           Current Medications (01/19/2024):  This is the current hospital active medication list Current Facility-Administered Medications  Medication Dose Route Frequency Provider Last Rate Last Admin   acetaminophen (TYLENOL) tablet 1,000 mg  1,000 mg Oral Q8H PRN Melene Plan, DO   1,000 mg at 01/19/24 0741   albuterol (VENTOLIN HFA) 108 (90 Base) MCG/ACT inhaler 2 puff  2 puff Inhalation Q6H PRN Ernie Avena, MD   2 puff at 01/19/24 1047   diltiazem (CARDIZEM CD) 24 hr capsule 120 mg  120 mg Oral Daily Ernie Avena,  MD   120 mg at 01/19/24 0908   gabapentin (NEURONTIN) capsule 300 mg  300 mg Oral TID Ernie Avena, MD   300 mg at 01/19/24 2952   QUEtiapine (SEROQUEL) tablet 12.5 mg  12.5 mg Oral Daily Ernie Avena, MD   12.5 mg at 01/19/24 8413   QUEtiapine (SEROQUEL) tablet 50 mg  50 mg Oral QHS Ernie Avena, MD   50 mg at 01/18/24 2129   Current Outpatient Medications  Medication Sig Dispense Refill    acetaminophen (TYLENOL) 325 MG tablet Take 650 mg by mouth in the morning, at noon, and at bedtime.     albuterol (PROAIR HFA) 108 (90 Base) MCG/ACT inhaler Inhale 2 puffs into the lungs every 6 (six) hours as needed for wheezing or shortness of breath. 8 g 5   calcium carbonate (OS-CAL) 600 MG tablet Take 600 mg by mouth 2 (two) times daily.     Cholecalciferol (VITAMIN D3) 2000 UNITS capsule Take 2,000 Units by mouth daily.     diltiazem (CARDIZEM CD) 120 MG 24 hr capsule Take 1 capsule (120 mg total) by mouth daily. 90 capsule 3   gabapentin (NEURONTIN) 300 MG capsule TAKE ONE CAPSULE BY MOUTH THREE TIMES DAILY 90 capsule 0   Mepolizumab (NUCALA) 100 MG/ML SOAJ Inject 1 mL (100 mg total) into the skin every 28 (twenty-eight) days. Deliver to patient's home for self-administration. 1 mL 11   Multiple Vitamin (MULTIVITAMIN) tablet Take 1 tablet by mouth daily.     predniSONE (DELTASONE) 5 MG tablet Take 1 tablet (5 mg total) by mouth daily with breakfast. 100 tablet 3   QUEtiapine (SEROQUEL) 25 MG tablet Take 0.5 tablets (12.5 mg total) by mouth daily.     QUEtiapine (SEROQUEL) 50 MG tablet Take 1 tablet (50 mg total) by mouth at bedtime. 90 tablet 0   sertraline (ZOLOFT) 100 MG tablet TAKE TWO TABLETS BY MOUTH ONCE DAILY FOR ANXIETY 180 tablet 0     Discharge Medications: Please see discharge summary for a list of discharge medications.  Relevant Imaging Results:  Relevant Lab Results:   Additional Information SSN: [id number]  Princella Ion, LCSW

## 2024-01-19 NOTE — Evaluation (Signed)
Occupational Therapy Treatment Patient Details Name: Lisa Larson MRN: [id number] DOB: 1941-07-17 Today's Date: 01/19/2024   History of present illness 83 year old female who presented to the emergency department 01/18/24  with acute on chronic paranoia and hallucinations.PMH: hypertension, GERD, depression, COPD, C 7 fracture, R hip ORIF, afib, HA, depression/anxiety ,dementia   OT comments  Patient is a 83 year old was presented for above. Patient was living at home alone with son support daily. Currently, patient is having visual hallucinations and increased weakness impacting participation in ADLs. Patient was noted to have decreased functional activity tolerance, decreased endurance, decreased standing balance, decreased safety awareness, and decreased knowledge of AD/AE impacting participation in ADLs. Patient would continue to benefit from skilled OT services at this time while admitted and after d/c to address noted deficits in order to improve overall safety and independence in ADLs. Patient will benefit from continued inpatient follow up therapy, <3 hours/day.       If plan is discharge home, recommend the following:  A lot of help with bathing/dressing/bathroom;A lot of help with walking and/or transfers;Assistance with cooking/housework;Direct supervision/assist for medications management;Assist for transportation;Help with stairs or ramp for entrance;Direct supervision/assist for financial management   Equipment Recommendations  None recommended by OT       Precautions / Restrictions Precautions Precautions: Fall Restrictions Weight Bearing Restrictions Per Provider Order: No       Mobility Bed Mobility Overal bed mobility: Needs Assistance Bed Mobility: Rolling, Supine to Sit, Sit to Supine Rolling: +2 for safety/equipment, +2 for physical assistance, Max assist   Supine to sit: Max assist, +2 for physical assistance, +2 for safety/equipment Sit to supine: Max  assist, +2 for physical assistance, +2 for safety/equipment   General bed mobility comments: attempted to have patient roll to the side  but patient stated that right hip was painful, +2  assist to  move legs and assist to sit upright , use of bed linens to, total to return to supine, support         Balance Overall balance assessment: Needs assistance, History of Falls Sitting-balance support: Bilateral upper extremity supported, Feet unsupported Sitting balance-Leahy Scale: Fair     Standing balance support: Bilateral upper extremity supported, Reliant on assistive device for balance, During functional activity Standing balance-Leahy Scale: Poor             ADL either performed or assessed with clinical judgement   ADL Overall ADL's : Needs assistance/impaired Eating/Feeding: Set up;Sitting   Grooming: Sitting;Set up   Upper Body Bathing: Minimal assistance;Sitting   Lower Body Bathing: Maximal assistance;Sitting/lateral leans   Upper Body Dressing : Minimal assistance;Sitting   Lower Body Dressing: Maximal assistance;Sitting/lateral leans   Toilet Transfer: Minimal assistance;+2 for safety/equipment;Ambulation;Rolling walker (2 wheels) Toilet Transfer Details (indicate cue type and reason): with increased time. Toileting- Architect and Hygiene: Sit to/from stand;Total assistance              Extremity/Trunk Assessment Upper Extremity Assessment Upper Extremity Assessment: Generalized weakness (ROM limited with kyphotic posture. noted to have arthritis in digits.)   Lower Extremity Assessment Lower Extremity Assessment: Defer to PT evaluation   Cervical / Trunk Assessment Cervical / Trunk Assessment: Kyphotic;Other exceptions Cervical / Trunk Exceptions: holds head with hands when lying down and sitting up    Vision Baseline Vision/History: 1 Wears glasses            Cognition Arousal: Alert Behavior During Therapy: Flat affect Overall  Cognitive Status: Difficult to assess  General Comments: patient reported " i have dementia" patient presenting with hallucinations during session with son attempting to redirect patient as well. patient fearful of "man in red shirt telling me i need to use the hot rocks" patient asked multiple times to not have this done during session. no individual with red shirt observed during session. note was written for patient to not have the hot rocks per patient request. patient appeared to be more calm with this note in hand                   Pertinent Vitals/ Pain       Pain Assessment Pain Assessment: Faces Faces Pain Scale: Hurts whole lot Pain Location: neck and HA Pain Descriptors / Indicators: Aching, Grimacing Pain Intervention(s): Limited activity within patient's tolerance, Monitored during session  Home Living Family/patient expects to be discharged to:: Private residence Living Arrangements: Alone Available Help at Discharge: Available PRN/intermittently Type of Home: House Home Access: Ramped entrance     Home Layout: One level     Bathroom Shower/Tub: Producer, television/film/video: Standard Bathroom Accessibility: Yes   Home Equipment: Agricultural consultant (2 wheels);Cane - single point;Shower seat   Additional Comments: son checks on pt, several times a day          Frequency  Min 1X/week        Progress Toward Goals  OT Goals(current goals can now be found in the care plan section)     Acute Rehab OT Goals Patient Stated Goal: to not have the hot rocks OT Goal Formulation: With patient/family Time For Goal Achievement: 02/02/24 Potential to Achieve Goals: Fair  Plan      Co-evaluation      Reason for Co-Treatment: Necessary to address cognition/behavior during functional activity;For patient/therapist safety;To address functional/ADL transfers PT goals addressed during session: Mobility/safety with mobility OT goals addressed  during session: ADL's and self-care      AM-PAC OT "6 Clicks" Daily Activity     Outcome Measure   Help from another person eating meals?: A Little Help from another person taking care of personal grooming?: A Little Help from another person toileting, which includes using toliet, bedpan, or urinal?: A Lot Help from another person bathing (including washing, rinsing, drying)?: A Lot Help from another person to put on and taking off regular upper body clothing?: A Little Help from another person to put on and taking off regular lower body clothing?: A Lot 6 Click Score: 15    End of Session Equipment Utilized During Treatment: Gait belt;Rolling walker (2 wheels)  OT Visit Diagnosis: Unsteadiness on feet (R26.81);Other abnormalities of gait and mobility (R26.89);Repeated falls (R29.6)   Activity Tolerance Patient tolerated treatment well   Patient Left in bed;with call bell/phone within reach;with family/visitor present (in ED)   Nurse Communication Mobility status        Time: 1610-9604 OT Time Calculation (min): 19 min  Charges: OT General Charges $OT Visit: 1 Visit OT Evaluation $OT Eval Low Complexity: 1 Low  Yarisa Lynam OTR/L, MS Acute Rehabilitation Department Office# [phone]   Selinda Flavin 01/19/2024, 12:18 PM

## 2024-01-19 NOTE — ED Notes (Signed)
Tried for covid swab again and patient still refuses

## 2024-01-19 NOTE — Progress Notes (Addendum)
Discussed SNF rec with pt's daughter, Elmarie Shiley. Pt faxed out, bed offers pending.   Addend @ 2:49 PM CSW met with pt and family at bedside. Daughter reports she lives in the [place] and her brother is unable to care for pt. Family in agreement with pt going to SNF and receiving referral to [facility] to assist in finding LTC at ALF/Memory Care or having 24 hour home care for pt to remain in her home. TOC following for SNF bed offers.

## 2024-01-19 NOTE — ED Notes (Signed)
Patient has refused covid test    son is in the room and he said they dont do those tests

## 2024-01-20 ENCOUNTER — Encounter: Payer: Self-pay | Admitting: Internal Medicine

## 2024-01-20 DIAGNOSIS — R441 Visual hallucinations: Secondary | ICD-10-CM

## 2024-01-20 DIAGNOSIS — F02818 Dementia in other diseases classified elsewhere, unspecified severity, with other behavioral disturbance: Secondary | ICD-10-CM

## 2024-01-20 NOTE — Progress Notes (Addendum)
No bed offers yet. Awaiting call back from [facility].   Addend @ 11:39AM Provided daughter and update. Awaiting response from [place].

## 2024-01-20 NOTE — ED Notes (Signed)
Pt and family refused Covid, flu, rsv swab.

## 2024-01-20 NOTE — Care Management (Addendum)
Transition of Care [facility]) - Emergency Department Mini Assessment   Patient Details  Name: Lisa Larson MRN: [id number] Date of Birth: 1941-11-01  Transition of Care [facility]) CM/SW Contact:    Lavenia Atlas, RN Phone Number: 01/20/2024, 3:29 PM   Clinical Narrative: Per chart review and ED SW no bed offers resulting in patient returning home with HHPT/OT, HHA, SW. This RNCM spoke with patient's daughter to offer choice ([email or web address]) who reports she will accept any HH agency that will accept her insurance. [name] reports patient had [facility] previously and would like to go with a different agency. [name] reports she is here from the [place] visiting and now having to take care of her mother. [name] reports her brother Will is on his way to the hospital to transport patient back home.  This RNCM spoke with Amy with [facility] who will follow for [facility] services with a potential for first visit tomorrow.   Notified ED MD for dc summary, HHPT/OT, HHA, SW orders.   This RNCM will send referral to Jeannett Senior with [facility] to assist within the community for potential memory care placement.   No additional TOC needs at this time.   ED Mini Assessment:    Barriers to Discharge: No Barriers Identified  Barrier interventions: coordinating home health  Means of departure: Car  Interventions which prevented an admission or readmission: Home Health Consult or Services    Patient Contact and Communications     Spoke with: [name] Contact Date: 01/20/24,   Contact time: 1528 Contact Phone Number: [phone] Call outcome: coordinating HH and referral to [facility]  Patient states their goals for this hospitalization and ongoing recovery are:: to feel better CMS Medicare.gov Compare Post Acute Care list provided to:: Patient Represenative (must comment) (Daughter: Illa Level) Choice offered to / list presented to : Adult Children  Admission diagnosis:  dementia Patient Active  Problem List   Diagnosis Date Noted   Headache 11/11/2023   Alzheimer's dementia with behavioral disturbance (HCC) 10/06/2023   Altered mental status 09/08/2023   Lower back pain 07/16/2023   Fall 07/16/2023   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   Dental abscess 07/03/2022   Closed femur fracture (HCC) 03/03/2022   Atrial fibrillation with RVR (HCC) 03/03/2022   Severe persistent asthma 02/24/2021   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   Urinary frequency 05/26/2020   Urinary incontinence 05/25/2020   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   Lightheadedness 07/09/2018   Osteoporosis 12/22/2016   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   Family history of diabetes mellitus 09/22/2016   Depression 03/24/2015   Spine pain, multilevel 02/06/2015   Closed fracture of odontoid process of axis (HCC) 02/02/2015   Allergic rhinitis 04/17/2011   Hyperglycemia 11/28/2010   Asthma, persistent controlled 11/28/2010   GOUT, UNSPECIFIED 05/04/2009   Anxiety 09/29/2008   Essential hypertension 08/09/2008   GERD 09/29/2007   Osteoarthritis, diffuse 06/09/2007   PCP:  Pincus Sanes, MD Pharmacy:   [facility] [place], [place] - [address] [place] [address] Phone: [phone] Fax: [phone]  [facility] - [place], [place] - [address] [facility] [address] [facility] [address] [facility] [place] [address] Phone: [phone] Fax: [phone]  [id number] [place], [place] - [address] AT [facility] OF [facility] [address] [place] [address] Phone: [phone] Fax: [phone]

## 2024-01-20 NOTE — Progress Notes (Signed)
CSW spoke with pt's daughter to inform pt does not have any bed offers at this time. Pt's daughter agrees with discharging with [facility] services and a referral to [facility] to assist with placement into memory care from home. RNCM to assist with HH arrangement.

## 2024-01-21 ENCOUNTER — Telehealth: Payer: Self-pay | Admitting: Internal Medicine

## 2024-01-21 ENCOUNTER — Encounter: Payer: Self-pay | Admitting: Internal Medicine

## 2024-01-21 DIAGNOSIS — M109 Gout, unspecified: Secondary | ICD-10-CM | POA: Diagnosis not present

## 2024-01-21 DIAGNOSIS — Z79899 Other long term (current) drug therapy: Secondary | ICD-10-CM | POA: Diagnosis not present

## 2024-01-21 DIAGNOSIS — I480 Paroxysmal atrial fibrillation: Secondary | ICD-10-CM | POA: Diagnosis not present

## 2024-01-21 DIAGNOSIS — D649 Anemia, unspecified: Secondary | ICD-10-CM | POA: Diagnosis not present

## 2024-01-21 DIAGNOSIS — K219 Gastro-esophageal reflux disease without esophagitis: Secondary | ICD-10-CM | POA: Diagnosis not present

## 2024-01-21 DIAGNOSIS — M51369 Other intervertebral disc degeneration, lumbar region without mention of lumbar back pain or lower extremity pain: Secondary | ICD-10-CM | POA: Diagnosis not present

## 2024-01-21 DIAGNOSIS — N179 Acute kidney failure, unspecified: Secondary | ICD-10-CM | POA: Diagnosis not present

## 2024-01-21 DIAGNOSIS — A419 Sepsis, unspecified organism: Secondary | ICD-10-CM | POA: Diagnosis not present

## 2024-01-21 DIAGNOSIS — F0394 Unspecified dementia, unspecified severity, with anxiety: Secondary | ICD-10-CM | POA: Diagnosis not present

## 2024-01-21 DIAGNOSIS — Z556 Problems related to health literacy: Secondary | ICD-10-CM | POA: Diagnosis not present

## 2024-01-21 DIAGNOSIS — Z7952 Long term (current) use of systemic steroids: Secondary | ICD-10-CM | POA: Diagnosis not present

## 2024-01-21 DIAGNOSIS — Z87891 Personal history of nicotine dependence: Secondary | ICD-10-CM | POA: Diagnosis not present

## 2024-01-21 DIAGNOSIS — F32A Depression, unspecified: Secondary | ICD-10-CM | POA: Diagnosis not present

## 2024-01-21 DIAGNOSIS — M810 Age-related osteoporosis without current pathological fracture: Secondary | ICD-10-CM | POA: Diagnosis not present

## 2024-01-21 DIAGNOSIS — G9341 Metabolic encephalopathy: Secondary | ICD-10-CM | POA: Diagnosis not present

## 2024-01-21 DIAGNOSIS — J1000 Influenza due to other identified influenza virus with unspecified type of pneumonia: Secondary | ICD-10-CM | POA: Diagnosis not present

## 2024-01-21 DIAGNOSIS — J4489 Other specified chronic obstructive pulmonary disease: Secondary | ICD-10-CM | POA: Diagnosis not present

## 2024-01-21 DIAGNOSIS — I10 Essential (primary) hypertension: Secondary | ICD-10-CM | POA: Diagnosis not present

## 2024-01-21 DIAGNOSIS — E872 Acidosis, unspecified: Secondary | ICD-10-CM | POA: Diagnosis not present

## 2024-01-21 DIAGNOSIS — R6520 Severe sepsis without septic shock: Secondary | ICD-10-CM | POA: Diagnosis not present

## 2024-01-21 DIAGNOSIS — Z961 Presence of intraocular lens: Secondary | ICD-10-CM | POA: Diagnosis not present

## 2024-01-21 DIAGNOSIS — J9601 Acute respiratory failure with hypoxia: Secondary | ICD-10-CM | POA: Diagnosis not present

## 2024-01-21 DIAGNOSIS — F0393 Unspecified dementia, unspecified severity, with mood disturbance: Secondary | ICD-10-CM | POA: Diagnosis not present

## 2024-01-21 DIAGNOSIS — F22 Delusional disorders: Secondary | ICD-10-CM | POA: Diagnosis not present

## 2024-01-21 MED ORDER — OLANZAPINE 5 MG PO TABS: 5.0000 mg | ORAL_TABLET | Freq: Every day | ORAL | 2 refills | Status: DC

## 2024-01-21 NOTE — Telephone Encounter (Signed)
Duplicate message. 

## 2024-01-21 NOTE — Telephone Encounter (Signed)
Copied from CRM [id number]. Topic: Clinical - Medical Advice >> Jan 21, 2024  9:48 AM Leavy Cella D wrote: Reason for CRM: Daughter called wanting to know if patient can be prescribed to a different medication for her hallucinations. Daughter stated that they are constant , ongoing , disturbing and very graphic .

## 2024-01-21 NOTE — Telephone Encounter (Signed)
Message sent via my chart

## 2024-01-21 NOTE — Progress Notes (Deleted)
      Subjective:    Patient ID: Lisa Larson, female    DOB: [birth year], 83 y.o.   MRN: [id number]     HPI Lisa Larson is here for follow up from the ED.  Taken to the ED for acute on chronic paranoia and hallucinations. The past week she was increasingly paranoid and confused with ongoing hallucinations.  Had some swelling in feet and ankle, no cough, SOB or cp.   Ct head, cxr, blood work, EKG, urine negative.  EKG unchanged. BNP 213.    Medications and allergies reviewed with patient and updated if appropriate.  Current Outpatient Medications on File Prior to Visit  Medication Sig Dispense Refill   OLANZapine (ZYPREXA) 5 MG tablet Take 1 tablet (5 mg total) by mouth at bedtime. 30 tablet 2   acetaminophen (TYLENOL) 325 MG tablet Take 650 mg by mouth in the morning, at noon, and at bedtime.     albuterol (PROAIR HFA) 108 (90 Base) MCG/ACT inhaler Inhale 2 puffs into the lungs every 6 (six) hours as needed for wheezing or shortness of breath. 8 g 5   calcium carbonate (OS-CAL) 600 MG tablet Take 600 mg by mouth 2 (two) times daily.     Cholecalciferol (VITAMIN D3) 2000 UNITS capsule Take 2,000 Units by mouth daily.     diltiazem (CARDIZEM CD) 120 MG 24 hr capsule Take 1 capsule (120 mg total) by mouth daily. 90 capsule 3   gabapentin (NEURONTIN) 300 MG capsule TAKE ONE CAPSULE BY MOUTH THREE TIMES DAILY 90 capsule 0   Mepolizumab (NUCALA) 100 MG/ML SOAJ Inject 1 mL (100 mg total) into the skin every 28 (twenty-eight) days. Deliver to patient's home for self-administration. 1 mL 11   Multiple Vitamin (MULTIVITAMIN) tablet Take 1 tablet by mouth daily.     predniSONE (DELTASONE) 5 MG tablet Take 1 tablet (5 mg total) by mouth daily with breakfast. 100 tablet 3   sertraline (ZOLOFT) 100 MG tablet TAKE TWO TABLETS BY MOUTH ONCE DAILY FOR ANXIETY 180 tablet 0   No current facility-administered medications on file prior to visit.     Review of Systems     Objective:  There were  no vitals filed for this visit. BP Readings from Last 3 Encounters:  01/20/24 116/84  12/16/23 128/78  11/11/23 120/80   Wt Readings from Last 3 Encounters:  01/18/24 132 lb 0.9 oz (59.9 kg)  12/16/23 132 lb (59.9 kg)  11/11/23 127 lb (57.6 kg)   There is no height or weight on file to calculate BMI.    Physical Exam     Lab Results  Component Value Date   WBC 9.4 01/18/2024   HGB 13.5 01/18/2024   HCT 36.3 01/18/2024   PLT 151 01/18/2024   GLUCOSE 100 (H) 01/18/2024   CHOL 168 05/08/2023   TRIG 236.0 (H) 05/08/2023   HDL 53.10 05/08/2023   LDLDIRECT 79.0 05/08/2023   LDLCALC 69 11/06/2022   ALT 17 01/18/2024   AST 29 01/18/2024   NA 143 01/18/2024   K 3.7 01/18/2024   CL 105 01/18/2024   CREATININE 0.82 01/18/2024   BUN 20 01/18/2024   CO2 26 01/18/2024   TSH 1.429 01/18/2024   INR 1.0 03/03/2022   HGBA1C 5.4 05/08/2023     Assessment & Plan:    See Problem List for Assessment and Plan of chronic medical problems.

## 2024-01-21 NOTE — Telephone Encounter (Signed)
Ok for orders.    Would recommend an otc cough syrup such as delsym  which has less side effects than any prescription cough syrup

## 2024-01-21 NOTE — Patient Instructions (Incomplete)
      Blood work was ordered.       Medications changes include :   decrease lasix to 20 mg daily and decrease potassium to 20 mg daily   A referral was ordered for the heart failure clinic.      Return in about 2 months (around 01/17/2024) for follow up.

## 2024-01-21 NOTE — Telephone Encounter (Signed)
Copied from CRM [id number]. Topic: Clinical - Home Health Verbal Orders >> Jan 21, 2024  1:20 PM Fredrich Romans wrote: Caller/Agency: [facility] Callback Number: [phone] Service Requested: Physical Therapy Frequency: 2wk2 1wk2  Any new concerns about the patient? Yes Patient having wheezing on upper lobe of lungs.That started last night 01/20/2024.Would lie to know what she can take for coughing spells

## 2024-01-21 NOTE — Telephone Encounter (Signed)
Copied from CRM [id number]. Topic: Referral - Request for Referral >> Jan 21, 2024  9:51 AM Leavy Cella D wrote: Did the patient discuss referral with their provider in the last year? No (If No - schedule appointment) (If Yes - send message)  Appointment offered? No  Type of order/referral and detailed reason for visit: Hospice Evaluation   Preference of office, provider, location: n/a  If referral order, have you been seen by this specialty before? No (If Yes, this issue or another issue? When? Where?  Can we respond through MyChart? No

## 2024-01-21 NOTE — Telephone Encounter (Signed)
Copied from CRM [id number]. Topic: Referral - Request for Referral >> Jan 20, 2024  4:10 PM Prudencio Pair wrote: Did the patient discuss referral with their provider in the last year? No (If No - schedule appointment) (If Yes - send message)  Appointment offered? No  Type of order/referral and detailed reason for visit: Was told by short term care facility that she needs a referral to psychiatrist as short term care will not accept her due to hallucinations. Daughter states this is URGENT!   Preference of office, provider, location: N/A  If referral order, have you been seen by this specialty before? No (If Yes, this issue or another issue? When? Where?  Can we respond through MyChart? Yes

## 2024-01-22 ENCOUNTER — Emergency Department (HOSPITAL_COMMUNITY): Payer: Medicare Other

## 2024-01-22 ENCOUNTER — Telehealth: Payer: Self-pay | Admitting: Internal Medicine

## 2024-01-22 ENCOUNTER — Other Ambulatory Visit: Payer: Self-pay

## 2024-01-22 ENCOUNTER — Inpatient Hospital Stay (HOSPITAL_COMMUNITY)
Admission: EM | Admit: 2024-01-22 | Discharge: 2024-01-29 | DRG: 871 | Disposition: A | Discharge status: Skilled Nursing Facility | Payer: Medicare Other | Attending: Internal Medicine | Admitting: Internal Medicine

## 2024-01-22 ENCOUNTER — Inpatient Hospital Stay: Payer: Medicare Other | Admitting: Internal Medicine

## 2024-01-22 ENCOUNTER — Encounter (HOSPITAL_COMMUNITY): Payer: Self-pay | Admitting: Emergency Medicine

## 2024-01-22 DIAGNOSIS — I4891 Unspecified atrial fibrillation: Secondary | ICD-10-CM | POA: Diagnosis not present

## 2024-01-22 DIAGNOSIS — F411 Generalized anxiety disorder: Secondary | ICD-10-CM | POA: Diagnosis not present

## 2024-01-22 DIAGNOSIS — E2749 Other adrenocortical insufficiency: Secondary | ICD-10-CM | POA: Diagnosis not present

## 2024-01-22 DIAGNOSIS — F02811 Dementia in other diseases classified elsewhere, unspecified severity, with agitation: Secondary | ICD-10-CM | POA: Diagnosis not present

## 2024-01-22 DIAGNOSIS — I2489 Other forms of acute ischemic heart disease: Secondary | ICD-10-CM | POA: Diagnosis present

## 2024-01-22 DIAGNOSIS — F32A Depression, unspecified: Secondary | ICD-10-CM | POA: Diagnosis present

## 2024-01-22 DIAGNOSIS — J9601 Acute respiratory failure with hypoxia: Secondary | ICD-10-CM | POA: Diagnosis present

## 2024-01-22 DIAGNOSIS — J111 Influenza due to unidentified influenza virus with other respiratory manifestations: Principal | ICD-10-CM

## 2024-01-22 DIAGNOSIS — I081 Rheumatic disorders of both mitral and tricuspid valves: Secondary | ICD-10-CM

## 2024-01-22 DIAGNOSIS — E874 Mixed disorder of acid-base balance: Secondary | ICD-10-CM | POA: Diagnosis present

## 2024-01-22 DIAGNOSIS — E2740 Unspecified adrenocortical insufficiency: Secondary | ICD-10-CM | POA: Diagnosis present

## 2024-01-22 DIAGNOSIS — A419 Sepsis, unspecified organism: Secondary | ICD-10-CM

## 2024-01-22 DIAGNOSIS — Z515 Encounter for palliative care: Secondary | ICD-10-CM | POA: Diagnosis not present

## 2024-01-22 DIAGNOSIS — G9341 Metabolic encephalopathy: Secondary | ICD-10-CM | POA: Diagnosis present

## 2024-01-22 DIAGNOSIS — Z7401 Bed confinement status: Secondary | ICD-10-CM | POA: Diagnosis not present

## 2024-01-22 DIAGNOSIS — G9349 Other encephalopathy: Secondary | ICD-10-CM | POA: Diagnosis not present

## 2024-01-22 DIAGNOSIS — I4811 Longstanding persistent atrial fibrillation: Secondary | ICD-10-CM | POA: Diagnosis present

## 2024-01-22 DIAGNOSIS — K7200 Acute and subacute hepatic failure without coma: Secondary | ICD-10-CM | POA: Diagnosis present

## 2024-01-22 DIAGNOSIS — A4189 Other specified sepsis: Principal | ICD-10-CM | POA: Diagnosis present

## 2024-01-22 DIAGNOSIS — Z7952 Long term (current) use of systemic steroids: Secondary | ICD-10-CM

## 2024-01-22 DIAGNOSIS — R931 Abnormal findings on diagnostic imaging of heart and coronary circulation: Secondary | ICD-10-CM | POA: Insufficient documentation

## 2024-01-22 DIAGNOSIS — Z87440 Personal history of urinary (tract) infections: Secondary | ICD-10-CM

## 2024-01-22 DIAGNOSIS — J449 Chronic obstructive pulmonary disease, unspecified: Secondary | ICD-10-CM | POA: Diagnosis not present

## 2024-01-22 DIAGNOSIS — J101 Influenza due to other identified influenza virus with other respiratory manifestations: Secondary | ICD-10-CM | POA: Diagnosis present

## 2024-01-22 DIAGNOSIS — F02818 Dementia in other diseases classified elsewhere, unspecified severity, with other behavioral disturbance: Secondary | ICD-10-CM

## 2024-01-22 DIAGNOSIS — I214 Non-ST elevation (NSTEMI) myocardial infarction: Secondary | ICD-10-CM

## 2024-01-22 DIAGNOSIS — J45998 Other asthma: Secondary | ICD-10-CM | POA: Diagnosis not present

## 2024-01-22 DIAGNOSIS — Z886 Allergy status to analgesic agent status: Secondary | ICD-10-CM

## 2024-01-22 DIAGNOSIS — R6521 Severe sepsis with septic shock: Secondary | ICD-10-CM | POA: Diagnosis present

## 2024-01-22 DIAGNOSIS — M150 Primary generalized (osteo)arthritis: Secondary | ICD-10-CM | POA: Diagnosis not present

## 2024-01-22 DIAGNOSIS — Z882 Allergy status to sulfonamides status: Secondary | ICD-10-CM

## 2024-01-22 DIAGNOSIS — J8283 Eosinophilic asthma: Secondary | ICD-10-CM | POA: Diagnosis present

## 2024-01-22 DIAGNOSIS — G3183 Dementia with Lewy bodies: Secondary | ICD-10-CM

## 2024-01-22 DIAGNOSIS — Z8249 Family history of ischemic heart disease and other diseases of the circulatory system: Secondary | ICD-10-CM

## 2024-01-22 DIAGNOSIS — Z9181 History of falling: Secondary | ICD-10-CM

## 2024-01-22 DIAGNOSIS — M810 Age-related osteoporosis without current pathological fracture: Secondary | ICD-10-CM | POA: Diagnosis not present

## 2024-01-22 DIAGNOSIS — Z833 Family history of diabetes mellitus: Secondary | ICD-10-CM

## 2024-01-22 DIAGNOSIS — G4700 Insomnia, unspecified: Secondary | ICD-10-CM | POA: Diagnosis not present

## 2024-01-22 DIAGNOSIS — I3139 Other pericardial effusion (noninflammatory): Secondary | ICD-10-CM | POA: Diagnosis present

## 2024-01-22 DIAGNOSIS — I4819 Other persistent atrial fibrillation: Secondary | ICD-10-CM

## 2024-01-22 DIAGNOSIS — J09X1 Influenza due to identified novel influenza A virus with pneumonia: Secondary | ICD-10-CM

## 2024-01-22 DIAGNOSIS — G8929 Other chronic pain: Secondary | ICD-10-CM | POA: Diagnosis present

## 2024-01-22 DIAGNOSIS — J441 Chronic obstructive pulmonary disease with (acute) exacerbation: Secondary | ICD-10-CM | POA: Diagnosis present

## 2024-01-22 DIAGNOSIS — F0283 Dementia in other diseases classified elsewhere, unspecified severity, with mood disturbance: Secondary | ICD-10-CM | POA: Diagnosis present

## 2024-01-22 DIAGNOSIS — Z87891 Personal history of nicotine dependence: Secondary | ICD-10-CM

## 2024-01-22 DIAGNOSIS — Z1152 Encounter for screening for COVID-19: Secondary | ICD-10-CM | POA: Diagnosis not present

## 2024-01-22 DIAGNOSIS — I5020 Unspecified systolic (congestive) heart failure: Secondary | ICD-10-CM | POA: Diagnosis not present

## 2024-01-22 DIAGNOSIS — E785 Hyperlipidemia, unspecified: Secondary | ICD-10-CM | POA: Diagnosis present

## 2024-01-22 DIAGNOSIS — Z66 Do not resuscitate: Secondary | ICD-10-CM | POA: Diagnosis present

## 2024-01-22 DIAGNOSIS — Z9049 Acquired absence of other specified parts of digestive tract: Secondary | ICD-10-CM

## 2024-01-22 DIAGNOSIS — R41841 Cognitive communication deficit: Secondary | ICD-10-CM | POA: Diagnosis not present

## 2024-01-22 DIAGNOSIS — J1001 Influenza due to other identified influenza virus with the same other identified influenza virus pneumonia: Secondary | ICD-10-CM | POA: Diagnosis present

## 2024-01-22 DIAGNOSIS — F02B2 Dementia in other diseases classified elsewhere, moderate, with psychotic disturbance: Secondary | ICD-10-CM | POA: Diagnosis not present

## 2024-01-22 DIAGNOSIS — M542 Cervicalgia: Secondary | ICD-10-CM | POA: Diagnosis present

## 2024-01-22 DIAGNOSIS — M109 Gout, unspecified: Secondary | ICD-10-CM | POA: Diagnosis not present

## 2024-01-22 DIAGNOSIS — K219 Gastro-esophageal reflux disease without esophagitis: Secondary | ICD-10-CM | POA: Diagnosis present

## 2024-01-22 DIAGNOSIS — D649 Anemia, unspecified: Secondary | ICD-10-CM | POA: Diagnosis not present

## 2024-01-22 DIAGNOSIS — R4182 Altered mental status, unspecified: Secondary | ICD-10-CM | POA: Diagnosis not present

## 2024-01-22 DIAGNOSIS — I4510 Unspecified right bundle-branch block: Secondary | ICD-10-CM | POA: Diagnosis present

## 2024-01-22 DIAGNOSIS — Z807 Family history of other malignant neoplasms of lymphoid, hematopoietic and related tissues: Secondary | ICD-10-CM

## 2024-01-22 DIAGNOSIS — Z885 Allergy status to narcotic agent status: Secondary | ICD-10-CM

## 2024-01-22 DIAGNOSIS — J9801 Acute bronchospasm: Secondary | ICD-10-CM | POA: Diagnosis not present

## 2024-01-22 DIAGNOSIS — Z820 Family history of epilepsy and other diseases of the nervous system: Secondary | ICD-10-CM

## 2024-01-22 DIAGNOSIS — R262 Difficulty in walking, not elsewhere classified: Secondary | ICD-10-CM | POA: Diagnosis not present

## 2024-01-22 DIAGNOSIS — R7401 Elevation of levels of liver transaminase levels: Secondary | ICD-10-CM

## 2024-01-22 DIAGNOSIS — Z79899 Other long term (current) drug therapy: Secondary | ICD-10-CM

## 2024-01-22 DIAGNOSIS — Z7189 Other specified counseling: Secondary | ICD-10-CM | POA: Diagnosis not present

## 2024-01-22 DIAGNOSIS — Z888 Allergy status to other drugs, medicaments and biological substances status: Secondary | ICD-10-CM

## 2024-01-22 DIAGNOSIS — L89151 Pressure ulcer of sacral region, stage 1: Secondary | ICD-10-CM | POA: Diagnosis present

## 2024-01-22 DIAGNOSIS — N179 Acute kidney failure, unspecified: Secondary | ICD-10-CM | POA: Diagnosis present

## 2024-01-22 DIAGNOSIS — E46 Unspecified protein-calorie malnutrition: Secondary | ICD-10-CM | POA: Diagnosis not present

## 2024-01-22 DIAGNOSIS — R0602 Shortness of breath: Secondary | ICD-10-CM | POA: Diagnosis present

## 2024-01-22 DIAGNOSIS — R0902 Hypoxemia: Secondary | ICD-10-CM | POA: Diagnosis not present

## 2024-01-22 DIAGNOSIS — I517 Cardiomegaly: Secondary | ICD-10-CM | POA: Diagnosis not present

## 2024-01-22 DIAGNOSIS — R519 Headache, unspecified: Secondary | ICD-10-CM | POA: Diagnosis not present

## 2024-01-22 DIAGNOSIS — L8990 Pressure ulcer of unspecified site, unspecified stage: Secondary | ICD-10-CM | POA: Insufficient documentation

## 2024-01-22 DIAGNOSIS — I959 Hypotension, unspecified: Secondary | ICD-10-CM | POA: Diagnosis not present

## 2024-01-22 DIAGNOSIS — R069 Unspecified abnormalities of breathing: Secondary | ICD-10-CM | POA: Diagnosis not present

## 2024-01-22 DIAGNOSIS — I10 Essential (primary) hypertension: Secondary | ICD-10-CM | POA: Diagnosis present

## 2024-01-22 DIAGNOSIS — M1990 Unspecified osteoarthritis, unspecified site: Secondary | ICD-10-CM | POA: Diagnosis present

## 2024-01-22 DIAGNOSIS — F0282 Dementia in other diseases classified elsewhere, unspecified severity, with psychotic disturbance: Secondary | ICD-10-CM | POA: Diagnosis present

## 2024-01-22 DIAGNOSIS — R000 Tachycardia, unspecified: Secondary | ICD-10-CM | POA: Diagnosis not present

## 2024-01-22 DIAGNOSIS — R0609 Other forms of dyspnea: Secondary | ICD-10-CM | POA: Diagnosis not present

## 2024-01-22 DIAGNOSIS — J8281 Chronic eosinophilic pneumonia: Secondary | ICD-10-CM | POA: Diagnosis not present

## 2024-01-22 DIAGNOSIS — J4550 Severe persistent asthma, uncomplicated: Secondary | ICD-10-CM | POA: Diagnosis not present

## 2024-01-22 DIAGNOSIS — Z8261 Family history of arthritis: Secondary | ICD-10-CM

## 2024-01-22 DIAGNOSIS — J9600 Acute respiratory failure, unspecified whether with hypoxia or hypercapnia: Secondary | ICD-10-CM | POA: Diagnosis present

## 2024-01-22 DIAGNOSIS — Z90710 Acquired absence of both cervix and uterus: Secondary | ICD-10-CM

## 2024-01-22 DIAGNOSIS — M6281 Muscle weakness (generalized): Secondary | ICD-10-CM | POA: Diagnosis not present

## 2024-01-22 LAB — CBC WITH DIFFERENTIAL/PLATELET
Abs Immature Granulocytes: 0.06 10*3/uL (ref 0.00–0.07)
Basophils Absolute: 0 10*3/uL (ref 0.0–0.1)
Basophils Relative: 0 %
Eosinophils Absolute: 0 10*3/uL (ref 0.0–0.5)
Eosinophils Relative: 0 %
HCT: 43.5 % (ref 36.0–46.0)
Hemoglobin: 13.6 g/dL (ref 12.0–15.0)
Immature Granulocytes: 1 %
Lymphocytes Relative: 9 %
Lymphs Abs: 0.8 10*3/uL (ref 0.7–4.0)
MCH: 31.1 pg (ref 26.0–34.0)
MCHC: 31.3 g/dL (ref 30.0–36.0)
MCV: 99.3 fL (ref 80.0–100.0)
Monocytes Absolute: 1.1 10*3/uL — ABNORMAL HIGH (ref 0.1–1.0)
Monocytes Relative: 12 %
Neutro Abs: 7.3 10*3/uL (ref 1.7–7.7)
Neutrophils Relative %: 78 %
Platelets: 143 10*3/uL — ABNORMAL LOW (ref 150–400)
RBC: 4.38 MIL/uL (ref 3.87–5.11)
RDW: 14.3 % (ref 11.5–15.5)
WBC: 9.3 10*3/uL (ref 4.0–10.5)
nRBC: 0 % (ref 0.0–0.2)

## 2024-01-22 LAB — I-STAT VENOUS BLOOD GAS, ED
Acid-base deficit: 5 mmol/L — ABNORMAL HIGH (ref 0.0–2.0)
Bicarbonate: 15.1 mmol/L — ABNORMAL LOW (ref 20.0–28.0)
Calcium, Ion: 0.84 mmol/L — CL (ref 1.15–1.40)
HCT: 44 % (ref 36.0–46.0)
Hemoglobin: 15 g/dL (ref 12.0–15.0)
O2 Saturation: 98 %
Potassium: 4.9 mmol/L (ref 3.5–5.1)
Sodium: 138 mmol/L (ref 135–145)
TCO2: 16 mmol/L — ABNORMAL LOW (ref 22–32)
pCO2, Ven: 19.2 mm[Hg] — CL (ref 44–60)
pH, Ven: 7.505 — ABNORMAL HIGH (ref 7.25–7.43)
pO2, Ven: 98 mm[Hg] — ABNORMAL HIGH (ref 32–45)

## 2024-01-22 LAB — COMPREHENSIVE METABOLIC PANEL
ALT: 165 U/L — ABNORMAL HIGH (ref 0–44)
AST: 244 U/L — ABNORMAL HIGH (ref 15–41)
Albumin: 4 g/dL (ref 3.5–5.0)
Alkaline Phosphatase: 50 U/L (ref 38–126)
Anion gap: 16 — ABNORMAL HIGH (ref 5–15)
BUN: 22 mg/dL (ref 8–23)
CO2: 22 mmol/L (ref 22–32)
Calcium: 9.5 mg/dL (ref 8.9–10.3)
Chloride: 105 mmol/L (ref 98–111)
Creatinine, Ser: 1.86 mg/dL — ABNORMAL HIGH (ref 0.44–1.00)
GFR, Estimated: 27 mL/min — ABNORMAL LOW (ref >60)
Glucose, Bld: 161 mg/dL — ABNORMAL HIGH (ref 70–99)
Potassium: 3.6 mmol/L (ref 3.5–5.1)
Sodium: 143 mmol/L (ref 135–145)
Total Bilirubin: 0.7 mg/dL (ref 0.0–1.2)
Total Protein: 7.2 g/dL (ref 6.5–8.1)

## 2024-01-22 LAB — CBC
HCT: 40 % (ref 36.0–46.0)
Hemoglobin: 12.6 g/dL (ref 12.0–15.0)
MCH: 31.4 pg (ref 26.0–34.0)
MCHC: 31.5 g/dL (ref 30.0–36.0)
MCV: 99.8 fL (ref 80.0–100.0)
Platelets: 122 10*3/uL — ABNORMAL LOW (ref 150–400)
RBC: 4.01 MIL/uL (ref 3.87–5.11)
RDW: 14.2 % (ref 11.5–15.5)
WBC: 8.3 10*3/uL (ref 4.0–10.5)
nRBC: 0 % (ref 0.0–0.2)

## 2024-01-22 LAB — URINALYSIS, W/ REFLEX TO CULTURE (INFECTION SUSPECTED)
Bilirubin Urine: NEGATIVE
Glucose, UA: 50 mg/dL — AB
Hgb urine dipstick: NEGATIVE
Ketones, ur: NEGATIVE mg/dL
Leukocytes,Ua: NEGATIVE
Nitrite: NEGATIVE
Protein, ur: 30 mg/dL — AB
Specific Gravity, Urine: 1.032 — ABNORMAL HIGH (ref 1.005–1.030)
pH: 5 (ref 5.0–8.0)

## 2024-01-22 LAB — PROTIME-INR
INR: 1.2 (ref 0.8–1.2)
Prothrombin Time: 15.1 s (ref 11.4–15.2)

## 2024-01-22 LAB — RESP PANEL BY RT-PCR (RSV, FLU A&B, COVID)  RVPGX2
Influenza A by PCR: POSITIVE — AB
Influenza B by PCR: NEGATIVE
Resp Syncytial Virus by PCR: NEGATIVE
SARS Coronavirus 2 by RT PCR: NEGATIVE

## 2024-01-22 LAB — HEPARIN LEVEL (UNFRACTIONATED): Heparin Unfractionated: 0.7 [IU]/mL (ref 0.30–0.70)

## 2024-01-22 LAB — MRSA NEXT GEN BY PCR, NASAL: MRSA by PCR Next Gen: NOT DETECTED

## 2024-01-22 LAB — BRAIN NATRIURETIC PEPTIDE: B Natriuretic Peptide: 301.6 pg/mL — ABNORMAL HIGH (ref 0.0–100.0)

## 2024-01-22 LAB — MAGNESIUM: Magnesium: 1.9 mg/dL (ref 1.7–2.4)

## 2024-01-22 LAB — GLUCOSE, CAPILLARY
Glucose-Capillary: 199 mg/dL — ABNORMAL HIGH (ref 70–99)
Glucose-Capillary: 264 mg/dL — ABNORMAL HIGH (ref 70–99)
Glucose-Capillary: 229 mg/dL — ABNORMAL HIGH (ref 70–99)
Glucose-Capillary: 231 mg/dL — ABNORMAL HIGH (ref 70–99)

## 2024-01-22 LAB — CREATININE, SERUM
Creatinine, Ser: 1.8 mg/dL — ABNORMAL HIGH (ref 0.44–1.00)
GFR, Estimated: 28 mL/min — ABNORMAL LOW (ref >60)

## 2024-01-22 LAB — HEMOGLOBIN A1C
Hgb A1c MFr Bld: 5.3 % (ref 4.8–5.6)
Mean Plasma Glucose: 105.41 mg/dL

## 2024-01-22 LAB — TROPONIN I (HIGH SENSITIVITY)
Troponin I (High Sensitivity): 1573 ng/L (ref <18)
Troponin I (High Sensitivity): 1446 ng/L (ref <18)

## 2024-01-22 LAB — I-STAT CG4 LACTIC ACID, ED: Lactic Acid, Venous: 5.5 mmol/L (ref 0.5–1.9)

## 2024-01-22 LAB — APTT: aPTT: 35 s (ref 24–36)

## 2024-01-22 MED ORDER — SODIUM CHLORIDE 0.9 % IV BOLUS
500.0000 mL | Freq: Once | INTRAVENOUS | Status: AC
  Administered 2024-01-22: 500 mL via INTRAVENOUS

## 2024-01-22 MED ORDER — SODIUM CHLORIDE 0.9 % IV SOLN
2.0000 g | INTRAVENOUS | Status: DC
Start: 2024-01-22 — End: 2024-01-26
  Administered 2024-01-22 – 2024-01-26 (×5): 2 g via INTRAVENOUS
  Filled 2024-01-22 (×5): qty 20

## 2024-01-22 MED ORDER — ONDANSETRON HCL 4 MG/2ML IJ SOLN: 4.0000 mg | Freq: Four times a day (QID) | INTRAMUSCULAR | Status: DC | PRN

## 2024-01-22 MED ORDER — AMIODARONE HCL IN DEXTROSE 360-4.14 MG/200ML-% IV SOLN
60.0000 mg/h | INTRAVENOUS | Status: AC
  Administered 2024-01-22 (×2): 60 mg/h via INTRAVENOUS
  Filled 2024-01-22 (×2): qty 200

## 2024-01-22 MED ORDER — CALCIUM GLUCONATE-NACL 2-0.675 GM/100ML-% IV SOLN
2.0000 g | Freq: Once | INTRAVENOUS | Status: AC
  Administered 2024-01-22: 2000 mg via INTRAVENOUS
  Filled 2024-01-22: qty 100

## 2024-01-22 MED ORDER — HEPARIN BOLUS VIA INFUSION
3600.0000 [IU] | Freq: Once | INTRAVENOUS | Status: AC
  Administered 2024-01-22: 3600 [IU] via INTRAVENOUS
  Filled 2024-01-22: qty 3600

## 2024-01-22 MED ORDER — MELATONIN 3 MG PO TABS
3.0000 mg | ORAL_TABLET | Freq: Once | ORAL | Status: AC
  Administered 2024-01-23: 3 mg via ORAL
  Filled 2024-01-22: qty 1

## 2024-01-22 MED ORDER — ORAL CARE MOUTH RINSE: 15.0000 mL | OROMUCOSAL | Status: DC | PRN

## 2024-01-22 MED ORDER — LACTATED RINGERS IV SOLN: INTRAVENOUS | Status: DC

## 2024-01-22 MED ORDER — ORAL CARE MOUTH RINSE
15.0000 mL | OROMUCOSAL | Status: DC
  Administered 2024-01-22 – 2024-01-27 (×17): 15 mL via OROMUCOSAL

## 2024-01-22 MED ORDER — POLYETHYLENE GLYCOL 3350 17 G PO PACK: 17.0000 g | PACK | Freq: Every day | ORAL | Status: DC | PRN

## 2024-01-22 MED ORDER — DOCUSATE SODIUM 100 MG PO CAPS: 100.0000 mg | ORAL_CAPSULE | Freq: Two times a day (BID) | ORAL | Status: DC | PRN

## 2024-01-22 MED ORDER — SODIUM CHLORIDE 0.9 % IV SOLN
500.0000 mg | INTRAVENOUS | Status: AC
  Administered 2024-01-22 – 2024-01-26 (×5): 500 mg via INTRAVENOUS
  Filled 2024-01-22 (×5): qty 5

## 2024-01-22 MED ORDER — SODIUM CHLORIDE 0.9 % IV SOLN: INTRAVENOUS | Status: AC | PRN

## 2024-01-22 MED ORDER — DILTIAZEM LOAD VIA INFUSION
20.0000 mg | Freq: Once | INTRAVENOUS | Status: AC
  Administered 2024-01-22: 20 mg via INTRAVENOUS
  Filled 2024-01-22: qty 20

## 2024-01-22 MED ORDER — OSELTAMIVIR PHOSPHATE 6 MG/ML PO SUSR
30.0000 mg | Freq: Every day | ORAL | Status: DC
  Administered 2024-01-23 – 2024-01-24 (×2): 30 mg via ORAL
  Filled 2024-01-22 (×4): qty 12.5

## 2024-01-22 MED ORDER — LACTATED RINGERS IV BOLUS
2000.0000 mL | Freq: Once | INTRAVENOUS | Status: AC
  Administered 2024-01-22: 2000 mL via INTRAVENOUS

## 2024-01-22 MED ORDER — AMIODARONE LOAD VIA INFUSION
150.0000 mg | Freq: Once | INTRAVENOUS | Status: AC
  Administered 2024-01-22: 150 mg via INTRAVENOUS
  Filled 2024-01-22: qty 83.34

## 2024-01-22 MED ORDER — MAGNESIUM SULFATE 2 GM/50ML IV SOLN
2.0000 g | Freq: Once | INTRAVENOUS | Status: AC
  Administered 2024-01-22: 2 g via INTRAVENOUS
  Filled 2024-01-22: qty 50

## 2024-01-22 MED ORDER — DILTIAZEM HCL-DEXTROSE 125-5 MG/125ML-% IV SOLN (PREMIX)
5.0000 mg/h | INTRAVENOUS | Status: DC
  Administered 2024-01-22: 5 mg/h via INTRAVENOUS
  Filled 2024-01-22: qty 125

## 2024-01-22 MED ORDER — AMIODARONE HCL IN DEXTROSE 360-4.14 MG/200ML-% IV SOLN
30.0000 mg/h | INTRAVENOUS | Status: DC
  Administered 2024-01-22 – 2024-01-25 (×5): 30 mg/h via INTRAVENOUS
  Filled 2024-01-22 (×6): qty 200

## 2024-01-22 MED ORDER — HEPARIN (PORCINE) 25000 UT/250ML-% IV SOLN
650.0000 [IU]/h | INTRAVENOUS | Status: DC
  Administered 2024-01-22: 700 [IU]/h via INTRAVENOUS
  Filled 2024-01-22: qty 250

## 2024-01-22 MED ORDER — ACETAMINOPHEN 325 MG PO TABS
650.0000 mg | ORAL_TABLET | Freq: Four times a day (QID) | ORAL | Status: DC | PRN
  Administered 2024-01-22 – 2024-01-29 (×11): 650 mg via ORAL
  Filled 2024-01-22 (×11): qty 2

## 2024-01-22 MED ORDER — INSULIN ASPART 100 UNIT/ML IJ SOLN
0.0000 [IU] | INTRAMUSCULAR | Status: DC
  Administered 2024-01-22: 3 [IU] via SUBCUTANEOUS
  Administered 2024-01-22: 2 [IU] via SUBCUTANEOUS
  Administered 2024-01-22: 3 [IU] via SUBCUTANEOUS
  Administered 2024-01-23: 1 [IU] via SUBCUTANEOUS
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
## 2024-01-22 NOTE — ED Provider Notes (Signed)
[place] EMERGENCY DEPARTMENT AT [facility] Provider Note   CSN: [id number] Arrival date & time: 01/22/24  8295     History  Chief Complaint  Patient presents with   Shortness of Breath    Lisa Larson is a 83 y.o. female.  HPI   83 year old female comes from home with concern for respiratory distress.  Report is worsening shortness of breath with cough for the last 3 days.  History limited secondary to respiratory distress.  Arrived on breathing treatment.  Received Solu-Medrol prior to arrival, concern for tachycardia and hypotension.  Home Medications Prior to Admission medications   Medication Sig Start Date End Date Taking? Authorizing Provider  acetaminophen (TYLENOL) 325 MG tablet Take 650 mg by mouth in the morning, at noon, and at bedtime.    [provider]  albuterol (PROAIR HFA) 108 (90 Base) MCG/ACT inhaler Inhale 2 puffs into the lungs every 6 (six) hours as needed for wheezing or shortness of breath. 09/01/23   [name], MD  calcium carbonate (OS-CAL) 600 MG tablet Take 600 mg by mouth 2 (two) times daily.    [provider]  Cholecalciferol (VITAMIN D3) 2000 UNITS capsule Take 2,000 Units by mouth daily.    [provider]  diltiazem (CARDIZEM CD) 120 MG 24 hr capsule Take 1 capsule (120 mg total) by mouth daily. 12/16/23   Tobb, Kardie, DO  gabapentin (NEURONTIN) 300 MG capsule TAKE ONE CAPSULE BY MOUTH THREE TIMES DAILY 12/21/23   Burns, Bobette Mo, MD  Mepolizumab (NUCALA) 100 MG/ML SOAJ Inject 1 mL (100 mg total) into the skin every 28 (twenty-eight) days. Deliver to patient's home for self-administration. 07/21/23   [name], MD  Multiple Vitamin (MULTIVITAMIN) tablet Take 1 tablet by mouth daily.    [provider]  OLANZapine (ZYPREXA) 5 MG tablet Take 1 tablet (5 mg total) by mouth at bedtime. 01/21/24   Pincus Sanes, MD  predniSONE (DELTASONE) 5 MG tablet Take 1 tablet (5 mg total) by mouth daily with  breakfast. 01/13/23   Shamleffer, Konrad Dolores, MD  sertraline (ZOLOFT) 100 MG tablet TAKE TWO TABLETS BY MOUTH ONCE DAILY FOR ANXIETY 11/30/23   Pincus Sanes, MD      Allergies    Diclofenac, Other, Oxycodone, Rofecoxib, Sulfa antibiotics, Sulfonamide derivatives, Oxycodone-aspirin, Prolia [denosumab], Ace inhibitors, Benazepril hcl, and Tramadol    Review of Systems   Review of Systems  Unable to perform ROS: Acuity of condition    Physical Exam Updated Vital Signs BP 101/88   Pulse (!) 112   Temp (!) 96.9 F (36.1 C) (Axillary)   Resp (!) 28   Ht 5\' 3"  (1.6 m)   Wt 60.2 kg   SpO2 92%   BMI 23.53 kg/m  Physical Exam Vitals and nursing note reviewed.  Constitutional:      General: She is in acute distress.     Appearance: Normal appearance. She is ill-appearing.  HENT:     Head: Normocephalic.     Mouth/Throat:     Mouth: Mucous membranes are moist.  Cardiovascular:     Rate and Rhythm: Normal rate.  Pulmonary:     Effort: Pulmonary effort is normal. No respiratory distress.     Breath sounds: Decreased breath sounds and wheezing present.  Abdominal:     Palpations: Abdomen is soft.     Tenderness: There is no abdominal tenderness.  Skin:    General: Skin is warm.  Neurological:  Mental Status: She is alert and oriented to person, place, and time. Mental status is at baseline.  Psychiatric:        Mood and Affect: Mood normal.     ED Results / Procedures / Treatments   Labs (all labs ordered are listed, but only abnormal results are displayed) Labs Reviewed  RESP PANEL BY RT-PCR (RSV, FLU A&B, COVID)  RVPGX2 - Abnormal; Notable for the following components:      Result Value   Influenza A by PCR POSITIVE (*)    All other components within normal limits  COMPREHENSIVE METABOLIC PANEL - Abnormal; Notable for the following components:   Glucose, Bld 161 (*)    Creatinine, Ser 1.86 (*)    AST 244 (*)    ALT 165 (*)    GFR, Estimated 27 (*)    Anion  gap 16 (*)    All other components within normal limits  CBC WITH DIFFERENTIAL/PLATELET - Abnormal; Notable for the following components:   Platelets 143 (*)    Monocytes Absolute 1.1 (*)    All other components within normal limits  BRAIN NATRIURETIC PEPTIDE - Abnormal; Notable for the following components:   B Natriuretic Peptide 301.6 (*)    All other components within normal limits  I-STAT CG4 LACTIC ACID, ED - Abnormal; Notable for the following components:   Lactic Acid, Venous 5.5 (*)    All other components within normal limits  I-STAT VENOUS BLOOD GAS, ED - Abnormal; Notable for the following components:   pH, Ven 7.505 (*)    pCO2, Ven 19.2 (*)    pO2, Ven 98 (*)    Bicarbonate 15.1 (*)    TCO2 16 (*)    Acid-base deficit 5.0 (*)    Calcium, Ion 0.84 (*)    All other components within normal limits  TROPONIN I (HIGH SENSITIVITY) - Abnormal; Notable for the following components:   Troponin I (High Sensitivity) 1,573 (*)    All other components within normal limits  CULTURE, BLOOD (ROUTINE X 2)  CULTURE, BLOOD (ROUTINE X 2)  PROTIME-INR  APTT  MAGNESIUM  URINALYSIS, W/ REFLEX TO CULTURE (INFECTION SUSPECTED)  I-STAT CG4 LACTIC ACID, ED  TROPONIN I (HIGH SENSITIVITY)    EKG EKG Interpretation Date/Time:  Friday January 22 2024 09:38:31 EST Ventricular Rate:  192 PR Interval:    QRS Duration:  122 QT Interval:  263 QTC Calculation: 470 R Axis:   87  Text Interpretation: Wide-QRS tachycardia Right bundle branch block Confirmed by Coralee Pesa [phone]) on 01/22/2024 10:29:56 AM  Radiology DG Chest Port 1 View Result Date: 01/22/2024 CLINICAL DATA:  83 year old female with possible sepsis. EXAM: PORTABLE CHEST 1 VIEW COMPARISON:  Chest radiographs 01/18/2024 and earlier. FINDINGS: Portable AP semi upright view at 0956 hours. Stable cardiomegaly and mediastinal contours. Larger lung volumes. Allowing for portable technique the lungs are clear. Visualized tracheal air  column is within normal limits. Chronic proximal left humerus deformity. Stable visualized osseous structures. Negative visible bowel gas. IMPRESSION: Cardiomegaly.  No acute cardiopulmonary abnormality. Electronically Signed   By: Odessa Fleming M.D.   On: 01/22/2024 10:38    Procedures .Critical Care  Performed by: Rozelle Logan, DO Authorized by: Rozelle Logan, DO   Critical care provider statement:    Critical care time (minutes):  75   Critical care time was exclusive of:  Separately billable procedures and treating other patients   Critical care was necessary to treat or prevent imminent or life-threatening deterioration  of the following conditions:  Cardiac failure, respiratory failure and sepsis   Critical care was time spent personally by me on the following activities:  Development of treatment plan with patient or surrogate, discussions with consultants, evaluation of patient's response to treatment, examination of patient, ordering and review of laboratory studies, ordering and review of radiographic studies, ordering and performing treatments and interventions, pulse oximetry, re-evaluation of patient's condition and review of old charts   I assumed direction of critical care for this patient from another provider in my specialty: no     Care discussed with: admitting provider       Medications Ordered in ED Medications  diltiazem (CARDIZEM) 1 mg/mL load via infusion 20 mg (20 mg Intravenous Bolus from Bag 01/22/24 0954)    And  diltiazem (CARDIZEM) 125 mg in dextrose 5% 125 mL (1 mg/mL) infusion (10 mg/hr Intravenous Rate/Dose Change 01/22/24 1109)  magnesium sulfate IVPB 2 g 50 mL (has no administration in time range)  sodium chloride 0.9 % bolus 500 mL (500 mLs Intravenous New Bag/Given 01/22/24 1054)    ED Course/ Medical Decision Making/ A&P                                 Medical Decision Making Amount and/or Complexity of Data Reviewed Labs: ordered. Radiology:  ordered.  Risk Prescription drug management.   83 year old female presents emergency department with respiratory distress, worsening shortness of breath and cough.  Arrives on a breathing treatment, tachypneic.  Diffuse wheezing bilaterally with diminished breath sounds at bases.  Tachycardic with heart rates in the 200s.  Appears to be atrial fibrillation with RVR, patient has history of this, is not on anticoagulation.  Blood work shows no leukocytosis, creatinine is increased, lactic of 5.5, BNP is around 300, first troponin is over 1500.  Chest x-ray shows stable cardiomegaly with no other acute findings.  Patient is influenza A positive.  After dose of IV Cardizem and infusion heart rate is improved to the 115-130, she had an episode of hypotension, improved with small fluid bo,us.  Consulted cardiology, heparin ordered and they will give further recommendations.  Heart rate continued to improve, however respirations became more labored.  Patient is reported to be a full code in her chart, the daughter at bedside believes that this is correct but does not have any documentation.  Patient transition to BiPAP, respirations improved. Concern for SIRS/sepsis with Influenza A in the setting of history of heart failure.   Called ICU for evaluation.  ICU will admit, antibiotics ordered.  Patients evaluation and results requires admission for further treatment and care.  Spoke with hospitalist, reviewed patient's ED course and they accept admission.  Patient agrees with admission plan, offers no new complaints and is stable/unchanged at time of admit.           Final Clinical Impression(s) / ED Diagnoses Final diagnoses:  None    Rx / DC Orders ED Discharge Orders     None         Rozelle Logan, DO 01/22/24 1255
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
## 2024-01-22 NOTE — ED Notes (Signed)
Report called to [facility] in ICU

## 2024-01-22 NOTE — Progress Notes (Signed)
Pt was transported on the BIPAP to 85m03 without complication.

## 2024-01-22 NOTE — ED Notes (Addendum)
Critical trop EDP aware

## 2024-01-22 NOTE — ED Notes (Signed)
Breathing treatment complete
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
## 2024-01-22 NOTE — Telephone Encounter (Signed)
Copied from CRM [id number]. Topic: General - Other >> Jan 22, 2024  8:41 AM Donita Brooks wrote: Reason for CRM: Paramedics were call to pt home this morning. pt wanted to inform Dr. Lawerance Bach that she going to [facility].

## 2024-01-22 NOTE — Progress Notes (Signed)
   01/22/24 1145  BiPAP/CPAP/SIPAP  BiPAP/CPAP/SIPAP Pt Type Adult  BiPAP/CPAP/SIPAP SERVO  Mask Type Full face mask  Mask Size Medium  Set Rate 10 breaths/min  Respiratory Rate 26 breaths/min  IPAP 15 cmH20  EPAP 5 cmH2O  FiO2 (%) 40 %    Pt was placed on BIPAP per MD order. Pt is tolerating well at this time. RT will monitor.

## 2024-01-22 NOTE — Telephone Encounter (Signed)
Verbals left today for patient.

## 2024-01-22 NOTE — ED Triage Notes (Signed)
Pt c/o sob x3 days with it worse today denies pain at this time

## 2024-01-22 NOTE — ED Notes (Signed)
Daughter at bedside.

## 2024-01-23 ENCOUNTER — Inpatient Hospital Stay (HOSPITAL_COMMUNITY): Payer: Medicare Other

## 2024-01-23 DIAGNOSIS — R0609 Other forms of dyspnea: Secondary | ICD-10-CM | POA: Diagnosis not present

## 2024-01-23 DIAGNOSIS — J9601 Acute respiratory failure with hypoxia: Secondary | ICD-10-CM | POA: Diagnosis not present

## 2024-01-23 DIAGNOSIS — J4550 Severe persistent asthma, uncomplicated: Secondary | ICD-10-CM

## 2024-01-23 DIAGNOSIS — J111 Influenza due to unidentified influenza virus with other respiratory manifestations: Secondary | ICD-10-CM

## 2024-01-23 DIAGNOSIS — G3183 Dementia with Lewy bodies: Secondary | ICD-10-CM

## 2024-01-23 DIAGNOSIS — I4891 Unspecified atrial fibrillation: Secondary | ICD-10-CM | POA: Diagnosis not present

## 2024-01-23 DIAGNOSIS — L8990 Pressure ulcer of unspecified site, unspecified stage: Secondary | ICD-10-CM | POA: Insufficient documentation

## 2024-01-23 LAB — RESPIRATORY PANEL BY PCR

## 2024-01-23 LAB — ECHOCARDIOGRAM COMPLETE
AR max vel: 1.68 cm2
AV Area VTI: 1.52 cm2
AV Area mean vel: 1.59 cm2
AV Mean grad: 1.3 mm[Hg]
AV Peak grad: 2.4 mm[Hg]
Ao pk vel: 0.78 m/s
Area-P 1/2: 8.01 cm2
Calc EF: 28.5 %
Height: 63 in
S' Lateral: 3.2 cm
Single Plane A2C EF: 16.5 %
Single Plane A4C EF: 36 %
Weight: 2243.4 [oz_av]

## 2024-01-23 LAB — GLUCOSE, CAPILLARY
Glucose-Capillary: 124 mg/dL — ABNORMAL HIGH (ref 70–99)
Glucose-Capillary: 98 mg/dL (ref 70–99)
Glucose-Capillary: 97 mg/dL (ref 70–99)
Glucose-Capillary: 115 mg/dL — ABNORMAL HIGH (ref 70–99)
Glucose-Capillary: 115 mg/dL — ABNORMAL HIGH (ref 70–99)

## 2024-01-23 LAB — BASIC METABOLIC PANEL
Anion gap: 10 (ref 5–15)
BUN: 19 mg/dL (ref 8–23)
CO2: 24 mmol/L (ref 22–32)
Calcium: 8.9 mg/dL (ref 8.9–10.3)
Chloride: 104 mmol/L (ref 98–111)
Creatinine, Ser: 0.97 mg/dL (ref 0.44–1.00)
GFR, Estimated: 59 mL/min — ABNORMAL LOW (ref >60)
Glucose, Bld: 116 mg/dL — ABNORMAL HIGH (ref 70–99)
Potassium: 4 mmol/L (ref 3.5–5.1)
Sodium: 138 mmol/L (ref 135–145)

## 2024-01-23 LAB — CBC
HCT: 38.9 % (ref 36.0–46.0)
Hemoglobin: 12.2 g/dL (ref 12.0–15.0)
MCH: 31 pg (ref 26.0–34.0)
MCHC: 31.4 g/dL (ref 30.0–36.0)
MCV: 99 fL (ref 80.0–100.0)
Platelets: 120 10*3/uL — ABNORMAL LOW (ref 150–400)
RBC: 3.93 MIL/uL (ref 3.87–5.11)
RDW: 14.2 % (ref 11.5–15.5)
WBC: 10.3 10*3/uL (ref 4.0–10.5)
nRBC: 0 % (ref 0.0–0.2)

## 2024-01-23 LAB — HEPATIC FUNCTION PANEL
ALT: 281 U/L — ABNORMAL HIGH (ref 0–44)
AST: 383 U/L — ABNORMAL HIGH (ref 15–41)
Albumin: 3 g/dL — ABNORMAL LOW (ref 3.5–5.0)
Alkaline Phosphatase: 47 U/L (ref 38–126)
Bilirubin, Direct: <0.1 mg/dL (ref 0.0–0.2)
Total Bilirubin: 0.5 mg/dL (ref 0.0–1.2)
Total Protein: 5.7 g/dL — ABNORMAL LOW (ref 6.5–8.1)

## 2024-01-23 LAB — LACTIC ACID, PLASMA: Lactic Acid, Venous: 3.3 mmol/L (ref 0.5–1.9)

## 2024-01-23 LAB — HEPARIN LEVEL (UNFRACTIONATED): Heparin Unfractionated: 0.57 [IU]/mL (ref 0.30–0.70)

## 2024-01-23 LAB — PHOSPHORUS: Phosphorus: 2.7 mg/dL (ref 2.5–4.6)

## 2024-01-23 LAB — MAGNESIUM: Magnesium: 2 mg/dL (ref 1.7–2.4)

## 2024-01-23 MED ORDER — QUETIAPINE FUMARATE 50 MG PO TABS
50.0000 mg | ORAL_TABLET | Freq: Every day | ORAL | Status: DC
  Administered 2024-01-23 – 2024-01-28 (×5): 50 mg via ORAL
  Filled 2024-01-23 (×5): qty 1

## 2024-01-23 MED ORDER — LEVALBUTEROL HCL 0.63 MG/3ML IN NEBU
0.6300 mg | INHALATION_SOLUTION | Freq: Four times a day (QID) | RESPIRATORY_TRACT | Status: DC | PRN
  Administered 2024-01-23 – 2024-01-24 (×3): 0.63 mg via RESPIRATORY_TRACT
  Filled 2024-01-23 (×5): qty 3

## 2024-01-23 MED ORDER — DILTIAZEM HCL 30 MG PO TABS
30.0000 mg | ORAL_TABLET | Freq: Four times a day (QID) | ORAL | Status: DC
  Administered 2024-01-23 – 2024-01-24 (×4): 30 mg via ORAL
  Filled 2024-01-23 (×4): qty 1

## 2024-01-23 MED ORDER — QUETIAPINE FUMARATE 25 MG PO TABS
25.0000 mg | ORAL_TABLET | Freq: Every day | ORAL | Status: DC
  Administered 2024-01-24 – 2024-01-29 (×5): 25 mg via ORAL
  Filled 2024-01-23 (×4): qty 1

## 2024-01-23 MED ORDER — BUDESONIDE 0.5 MG/2ML IN SUSP
0.5000 mg | Freq: Two times a day (BID) | RESPIRATORY_TRACT | Status: DC
Start: 2024-01-23 — End: 2024-01-29
  Administered 2024-01-23 – 2024-01-28 (×10): 0.5 mg via RESPIRATORY_TRACT
  Filled 2024-01-23 (×11): qty 2

## 2024-01-23 MED ORDER — INSULIN ASPART 100 UNIT/ML IJ SOLN
0.0000 [IU] | Freq: Three times a day (TID) | INTRAMUSCULAR | Status: DC
  Administered 2024-01-24 (×2): 1 [IU] via SUBCUTANEOUS
  Administered 2024-01-25: 2 [IU] via SUBCUTANEOUS
  Administered 2024-01-25: 1 [IU] via SUBCUTANEOUS
  Administered 2024-01-26 – 2024-01-27 (×2): 2 [IU] via SUBCUTANEOUS

## 2024-01-23 MED ORDER — AMIODARONE IV BOLUS ONLY 150 MG/100ML: 150.0000 mg | Freq: Once | INTRAVENOUS | Status: DC

## 2024-01-23 MED ORDER — METHYLPREDNISOLONE SODIUM SUCC 40 MG IJ SOLR
40.0000 mg | Freq: Two times a day (BID) | INTRAMUSCULAR | Status: DC
  Administered 2024-01-23 – 2024-01-26 (×7): 40 mg via INTRAVENOUS
  Filled 2024-01-23 (×7): qty 1

## 2024-01-23 MED ORDER — AMIODARONE LOAD VIA INFUSION
150.0000 mg | Freq: Once | INTRAVENOUS | Status: AC
  Administered 2024-01-23: 150 mg via INTRAVENOUS
  Filled 2024-01-23: qty 83.34

## 2024-01-23 MED ORDER — LOPERAMIDE HCL 2 MG PO CAPS
2.0000 mg | ORAL_CAPSULE | Freq: Three times a day (TID) | ORAL | Status: DC | PRN
  Administered 2024-01-26: 2 mg via ORAL
  Filled 2024-01-23: qty 1

## 2024-01-23 MED ORDER — ARFORMOTEROL TARTRATE 15 MCG/2ML IN NEBU
15.0000 ug | INHALATION_SOLUTION | Freq: Two times a day (BID) | RESPIRATORY_TRACT | Status: DC
  Administered 2024-01-23 – 2024-01-28 (×12): 15 ug via RESPIRATORY_TRACT
  Filled 2024-01-23 (×12): qty 2

## 2024-01-23 MED ORDER — REVEFENACIN 175 MCG/3ML IN SOLN
175.0000 ug | Freq: Every day | RESPIRATORY_TRACT | Status: DC
  Administered 2024-01-23 – 2024-01-28 (×5): 175 ug via RESPIRATORY_TRACT
  Filled 2024-01-23 (×7): qty 3

## 2024-01-23 MED ORDER — HEPARIN (PORCINE) 25000 UT/250ML-% IV SOLN: 650.0000 [IU]/h | INTRAVENOUS | Status: DC

## 2024-01-23 MED ORDER — PREDNISONE 10 MG PO TABS
5.0000 mg | ORAL_TABLET | Freq: Every day | ORAL | Status: DC
  Administered 2024-01-23: 5 mg via ORAL
  Filled 2024-01-23: qty 1

## 2024-01-23 MED ORDER — HEPARIN (PORCINE) 25000 UT/250ML-% IV SOLN
650.0000 [IU]/h | INTRAVENOUS | Status: DC
  Administered 2024-01-23: 650 [IU]/h via INTRAVENOUS
  Filled 2024-01-23: qty 250

## 2024-01-23 MED ORDER — CHLORHEXIDINE GLUCONATE CLOTH 2 % EX PADS
6.0000 | MEDICATED_PAD | Freq: Every day | CUTANEOUS | Status: DC
  Administered 2024-01-23 – 2024-01-29 (×6): 6 via TOPICAL

## 2024-01-23 MED ORDER — PERFLUTREN LIPID MICROSPHERE
1.0000 mL | INTRAVENOUS | Status: AC | PRN
  Administered 2024-01-23: 3 mL via INTRAVENOUS

## 2024-01-23 MED ORDER — GABAPENTIN 300 MG PO CAPS
300.0000 mg | ORAL_CAPSULE | Freq: Three times a day (TID) | ORAL | Status: DC
  Administered 2024-01-23 – 2024-01-29 (×16): 300 mg via ORAL
  Filled 2024-01-23 (×17): qty 1
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
## 2024-01-23 NOTE — Progress Notes (Signed)
Attempted ABG, pt kept moving and yelling. Could not get the abg at this time. RN notified.

## 2024-01-23 NOTE — Progress Notes (Signed)
   01/22/24 2150  BiPAP/CPAP/SIPAP  Reason BIPAP/CPAP not in use Other(comment)   Patient is stable and breathing is unlabored. Bipap is on standby, Patient prefers not to wear, Bipap is not indicated at this time.
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
## 2024-01-23 NOTE — Progress Notes (Signed)
Rounding Note    Patient Name: Lisa Larson Date of Encounter: 01/23/2024  [facility] Cardiologist: Thomasene Ripple, DO   Subjective   No CP; mild dyspnea  Inpatient Medications    Scheduled Meds:  arformoterol  15 mcg Nebulization BID   budesonide (PULMICORT) nebulizer solution  0.5 mg Nebulization BID   Chlorhexidine Gluconate Cloth  6 each Topical Daily   diltiazem  30 mg Oral Q6H   gabapentin  300 mg Oral TID   insulin aspart  0-9 Units Subcutaneous TID WC   melatonin  3 mg Oral Once   methylPREDNISolone (SOLU-MEDROL) injection  40 mg Intravenous Q12H   mouth rinse  15 mL Mouth Rinse 4 times per day   oseltamivir  30 mg Oral Daily   revefenacin  175 mcg Nebulization Daily   Continuous Infusions:  sodium chloride 10 mL/hr at 01/23/24 0600   amiodarone 30 mg/hr (01/23/24 0600)   azithromycin Stopped (01/22/24 1813)   cefTRIAXone (ROCEPHIN)  IV 2 g (01/23/24 1113)   heparin     PRN Meds: sodium chloride, acetaminophen, docusate sodium, levalbuterol, ondansetron (ZOFRAN) IV, mouth rinse, polyethylene glycol   Vital Signs    Vitals:   01/23/24 0645 01/23/24 0706 01/23/24 0900 01/23/24 1000  BP: (!) 123/104  (!) 114/97   Pulse: (!) 113  (!) 118 (!) 125  Resp: (!) 22  19 17   Temp:  97.8 F (36.6 C)    TempSrc:  Oral    SpO2: 100%  98% 98%  Weight:      Height:        Intake/Output Summary (Last 24 hours) at 01/23/2024 1118 Last data filed at 01/23/2024 0648 Gross per 24 hour  Intake 3759.73 ml  Output 550 ml  Net 3209.73 ml      01/23/2024    5:00 AM 01/22/2024    2:15 PM 01/22/2024    9:31 AM  Last 3 Weights  Weight (lbs) 140 lb 3.4 oz 139 lb 12.4 oz 132 lb 13.2 oz  Weight (kg) 63.6 kg 63.4 kg 60.25 kg      Telemetry    Atrial fibrillation rate elevated- Personally Reviewed   Physical Exam   GEN: No acute distress.   Neck: No JVD Cardiac: irregular, tachycardic Respiratory: Clear to auscultation bilaterally. GI: Soft,  nontender, non-distended  MS: No edema Neuro:  Nonfocal   Labs    High Sensitivity Troponin:   Recent Labs  Lab 01/18/24 1450 01/22/24 0955 01/22/24 1332  TROPONINIHS 11 1,573* 1,446*     Chemistry Recent Labs  Lab 01/18/24 1450 01/22/24 0955 01/22/24 0956 01/22/24 1332 01/23/24 0456  NA 143 143 138  --  138  K 3.7 3.6 4.9  --  4.0  CL 105 105  --   --  104  CO2 26 22  --   --  24  GLUCOSE 100* 161*  --   --  116*  BUN 20 22  --   --  19  CREATININE 0.82 1.86*  --  1.80* 0.97  CALCIUM 9.6 9.5  --   --  8.9  MG  --  1.9  --   --  2.0  PROT 7.7 7.2  --   --  5.7*  ALBUMIN 4.6 4.0  --   --  3.0*  AST 29 244*  --   --  383*  ALT 17 165*  --   --  281*  ALKPHOS 43 50  --   --  47  BILITOT 0.7 0.7  --   --  0.5  GFRNONAA >60 27*  --  28* 59*  ANIONGAP 12 16*  --   --  10    Hematology Recent Labs  Lab 01/22/24 0955 01/22/24 0956 01/22/24 1332 01/23/24 0456  WBC 9.3  --  8.3 10.3  RBC 4.38  --  4.01 3.93  HGB 13.6 15.0 12.6 12.2  HCT 43.5 44.0 40.0 38.9  MCV 99.3  --  99.8 99.0  MCH 31.1  --  31.4 31.0  MCHC 31.3  --  31.5 31.4  RDW 14.3  --  14.2 14.2  PLT 143*  --  122* 120*   Thyroid  Recent Labs  Lab 01/18/24 1450 01/18/24 1600  TSH 1.429  --   FREET4  --  0.86    BNP Recent Labs  Lab 01/18/24 1600 01/22/24 0955  BNP 213.3* 301.6*     Radiology    DG Chest Port 1 View Result Date: 01/22/2024 CLINICAL DATA:  83 year old female with possible sepsis. EXAM: PORTABLE CHEST 1 VIEW COMPARISON:  Chest radiographs 01/18/2024 and earlier. FINDINGS: Portable AP semi upright view at 0956 hours. Stable cardiomegaly and mediastinal contours. Larger lung volumes. Allowing for portable technique the lungs are clear. Visualized tracheal air column is within normal limits. Chronic proximal left humerus deformity. Stable visualized osseous structures. Negative visible bowel gas. IMPRESSION: Cardiomegaly.  No acute cardiopulmonary abnormality. Electronically  Signed   By: Odessa Fleming M.D.   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 Cardiology consulted for elevated cardiac enzymes and A-fib with RVR.   Assessment & Plan    1 atrial fibrillation with rapid ventricular response patient admitted with severe influenza A infection.  Her blood pressure has been borderline.  Will continue IV amiodarone for rate control.  Her liver functions are mildly elevated and this can be followed.  Can add low-dose Cardizem as needed/tolerated.  She is not on anticoagulation at home due to history of fall risk.  Await echocardiogram.  2 elevated troponin-mild elevation in troponin without chest pain or acute ST changes.  Likely demand ischemia.  She is presently on IV heparin for 48 hours.  This can then be discontinued.  No plans for further ischemia evaluation.  3 hyperlipidemia-statin on hold due to elevated liver functions.  4 history of dementia  5 no CODE BLUE  For questions or updates, please contact [facility] Please consult [email or web address] for contact info under        Signed, Olga Millers, MD  01/23/2024, 11:18 AM
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
## 2024-01-23 NOTE — Progress Notes (Signed)
Echocardiogram 2D Echocardiogram has been performed.  Lisa Larson 01/23/2024, 2:24 PM

## 2024-01-23 NOTE — Progress Notes (Signed)
eLink Physician-Brief Progress Note Patient Name: Lisa Larson DOB: December 12, 1941 MRN: [id number]   Date of Service  01/23/2024  HPI/Events of Note  Psychiatry consult recommends Seroquel 25 mg at lunchtime and 50 mg at bedtime.  Order was placed.  RN requesting orders to be placed.  eICU Interventions  Psychiatry consult note reviewed.  Seroquel ordered.     Intervention Category Minor Interventions: Routine modifications to care plan (e.g. PRN medications for pain, fever)  Carilyn Goodpasture 01/23/2024, 11:12 PM

## 2024-01-24 DIAGNOSIS — I4891 Unspecified atrial fibrillation: Secondary | ICD-10-CM | POA: Diagnosis not present

## 2024-01-24 DIAGNOSIS — J111 Influenza due to unidentified influenza virus with other respiratory manifestations: Secondary | ICD-10-CM | POA: Diagnosis not present

## 2024-01-24 LAB — CBC WITH DIFFERENTIAL/PLATELET
Abs Immature Granulocytes: 0.08 10*3/uL — ABNORMAL HIGH (ref 0.00–0.07)
Basophils Absolute: 0 10*3/uL (ref 0.0–0.1)
Basophils Relative: 0 %
Eosinophils Absolute: 0 10*3/uL (ref 0.0–0.5)
Eosinophils Relative: 0 %
HCT: 36.5 % (ref 36.0–46.0)
Hemoglobin: 11.5 g/dL — ABNORMAL LOW (ref 12.0–15.0)
Immature Granulocytes: 1 %
Lymphocytes Relative: 3 %
Lymphs Abs: 0.4 10*3/uL — ABNORMAL LOW (ref 0.7–4.0)
MCH: 30.4 pg (ref 26.0–34.0)
MCHC: 31.5 g/dL (ref 30.0–36.0)
MCV: 96.6 fL (ref 80.0–100.0)
Monocytes Absolute: 0.5 10*3/uL (ref 0.1–1.0)
Monocytes Relative: 4 %
Neutro Abs: 11.3 10*3/uL — ABNORMAL HIGH (ref 1.7–7.7)
Neutrophils Relative %: 92 %
Platelets: 111 10*3/uL — ABNORMAL LOW (ref 150–400)
RBC: 3.78 MIL/uL — ABNORMAL LOW (ref 3.87–5.11)
RDW: 14.3 % (ref 11.5–15.5)
WBC: 12.3 10*3/uL — ABNORMAL HIGH (ref 4.0–10.5)
nRBC: 0 % (ref 0.0–0.2)

## 2024-01-24 LAB — BASIC METABOLIC PANEL
Anion gap: 9 (ref 5–15)
BUN: 21 mg/dL (ref 8–23)
CO2: 25 mmol/L (ref 22–32)
Calcium: 8.6 mg/dL — ABNORMAL LOW (ref 8.9–10.3)
Chloride: 106 mmol/L (ref 98–111)
Creatinine, Ser: 0.92 mg/dL (ref 0.44–1.00)
GFR, Estimated: >60 mL/min (ref >60)
Glucose, Bld: 129 mg/dL — ABNORMAL HIGH (ref 70–99)
Potassium: 4.2 mmol/L (ref 3.5–5.1)
Sodium: 140 mmol/L (ref 135–145)

## 2024-01-24 LAB — GLUCOSE, CAPILLARY
Glucose-Capillary: 96 mg/dL (ref 70–99)
Glucose-Capillary: 113 mg/dL — ABNORMAL HIGH (ref 70–99)
Glucose-Capillary: 136 mg/dL — ABNORMAL HIGH (ref 70–99)
Glucose-Capillary: 149 mg/dL — ABNORMAL HIGH (ref 70–99)
Glucose-Capillary: 115 mg/dL — ABNORMAL HIGH (ref 70–99)

## 2024-01-24 LAB — HEPARIN LEVEL (UNFRACTIONATED): Heparin Unfractionated: 0.39 [IU]/mL (ref 0.30–0.70)

## 2024-01-24 MED ORDER — METOPROLOL TARTRATE 12.5 MG HALF TABLET
12.5000 mg | ORAL_TABLET | Freq: Two times a day (BID) | ORAL | Status: DC
  Administered 2024-01-24 (×2): 12.5 mg via ORAL
  Filled 2024-01-24 (×2): qty 1

## 2024-01-24 MED ORDER — ENSURE ENLIVE PO LIQD
237.0000 mL | Freq: Two times a day (BID) | ORAL | Status: DC
  Administered 2024-01-25 – 2024-01-29 (×3): 237 mL via ORAL

## 2024-01-24 NOTE — Hospital Course (Signed)
82yo with hx COPD who presented to ED with increased sob with cough x 3 days, initially noted to be hypoxemic needing bipap. Pt was found to be pos for flu. Pt found to be in afib with RVR and started on cardizem gtt. Cardiology following. Later transitioned to IV amiodarone

## 2024-01-24 NOTE — Progress Notes (Signed)
Rounding Note    Patient Name: Lisa Larson Date of Encounter: 01/24/2024  [facility] Cardiologist: Thomasene Ripple, DO   Subjective   Denies CP; mild dyspnea  Inpatient Medications    Scheduled Meds:  arformoterol  15 mcg Nebulization BID   budesonide (PULMICORT) nebulizer solution  0.5 mg Nebulization BID   Chlorhexidine Gluconate Cloth  6 each Topical Daily   diltiazem  30 mg Oral Q6H   gabapentin  300 mg Oral TID   insulin aspart  0-9 Units Subcutaneous TID WC   methylPREDNISolone (SOLU-MEDROL) injection  40 mg Intravenous Q12H   mouth rinse  15 mL Mouth Rinse 4 times per day   oseltamivir  30 mg Oral Daily   QUEtiapine  25 mg Oral Q lunch   QUEtiapine  50 mg Oral QHS   revefenacin  175 mcg Nebulization Daily   Continuous Infusions:  amiodarone 30 mg/hr (01/24/24 0640)   azithromycin Stopped (01/23/24 1308)   cefTRIAXone (ROCEPHIN)  IV Stopped (01/23/24 1143)   heparin 650 Units/hr (01/24/24 0600)   PRN Meds: acetaminophen, docusate sodium, levalbuterol, loperamide, ondansetron (ZOFRAN) IV, mouth rinse, polyethylene glycol   Vital Signs    Vitals:   01/24/24 0630 01/24/24 0645 01/24/24 0708 01/24/24 0736  BP: 98/71 93/66  (!) 88/61  Pulse: 93 86  75  Resp: 17 19  17   Temp:   (!) 97.4 F (36.3 C)   TempSrc:   Axillary   SpO2: 96% 96%  94%  Weight:      Height:        Intake/Output Summary (Last 24 hours) at 01/24/2024 0834 Last data filed at 01/24/2024 0600 Gross per 24 hour  Intake 992.48 ml  Output 400 ml  Net 592.48 ml      01/23/2024    5:00 AM 01/22/2024    2:15 PM 01/22/2024    9:31 AM  Last 3 Weights  Weight (lbs) 140 lb 3.4 oz 139 lb 12.4 oz 132 lb 13.2 oz  Weight (kg) 63.6 kg 63.4 kg 60.25 kg      Telemetry    Atrial fibrillation with PVCs or aberrantly conducted beats rate controlled.- Personally Reviewed   Physical Exam   GEN: NAD Neck: supple Cardiac: irregular Respiratory: CTA GI: Soft, NT/ND MS: No  edema Neuro:  Grossly intact  Labs    High Sensitivity Troponin:   Recent Labs  Lab 01/18/24 1450 01/22/24 0955 01/22/24 1332  TROPONINIHS 11 1,573* 1,446*     Chemistry Recent Labs  Lab 01/18/24 1450 01/22/24 0955 01/22/24 0956 01/22/24 1332 01/23/24 0456 01/24/24 0213  NA 143 143 138  --  138 140  K 3.7 3.6 4.9  --  4.0 4.2  CL 105 105  --   --  104 106  CO2 26 22  --   --  24 25  GLUCOSE 100* 161*  --   --  116* 129*  BUN 20 22  --   --  19 21  CREATININE 0.82 1.86*  --  1.80* 0.97 0.92  CALCIUM 9.6 9.5  --   --  8.9 8.6*  MG  --  1.9  --   --  2.0  --   PROT 7.7 7.2  --   --  5.7*  --   ALBUMIN 4.6 4.0  --   --  3.0*  --   AST 29 244*  --   --  383*  --   ALT 17 165*  --   --  281*  --   ALKPHOS 43 50  --   --  47  --   BILITOT 0.7 0.7  --   --  0.5  --   GFRNONAA >60 27*  --  28* 59* >60  ANIONGAP 12 16*  --   --  10 9    Hematology Recent Labs  Lab 01/22/24 1332 01/23/24 0456 01/24/24 0213  WBC 8.3 10.3 12.3*  RBC 4.01 3.93 3.78*  HGB 12.6 12.2 11.5*  HCT 40.0 38.9 36.5  MCV 99.8 99.0 96.6  MCH 31.4 31.0 30.4  MCHC 31.5 31.4 31.5  RDW 14.2 14.2 14.3  PLT 122* 120* 111*   Thyroid  Recent Labs  Lab 01/18/24 1450 01/18/24 1600  TSH 1.429  --   FREET4  --  0.86    BNP Recent Labs  Lab 01/18/24 1600 01/22/24 0955  BNP 213.3* 301.6*     Radiology    ECHOCARDIOGRAM COMPLETE Result Date: 01/23/2024    ECHOCARDIOGRAM REPORT   Patient Name:   SHI GROSE Date of Exam: 01/23/2024 Medical Rec #:  [id number]           Height:       63.0 in Accession #:    [id number]          Weight:       140.2 lb Date of Birth:  1941/01/16           BSA:          1.663 m Patient Age:    82 years            BP:           107/85 mmHg Patient Gender: F                   HR:           126 bpm. Exam Location:  Inpatient Procedure: 2D Echo, Cardiac Doppler, Color Doppler and Intracardiac            Opacification Agent Indications:    Dyspnea  History:         Patient has prior history of Echocardiogram examinations, most                 recent 03/04/2022. Risk Factors:Hypertension and Former Smoker.  Sonographer:    Karma Ganja Referring Phys: [id number] Perlie Gold  Sonographer Comments: Technically challenging study due to limited acoustic windows. Image acquisition challenging due to uncooperative patient. IMPRESSIONS  1. Left ventricular ejection fraction, by estimation, is 25 to 30%. The left ventricle has severely decreased function. The left ventricle demonstrates regional wall motion abnormalities (see scoring diagram/findings for description). Left ventricular diastolic parameters are indeterminate.  2. Right ventricular systolic function is moderately reduced. The right ventricular size is normal. There is mildly elevated pulmonary artery systolic pressure. The estimated right ventricular systolic pressure is 42.5 mmHg.  3. Left atrial size was mildly dilated.  4. Right atrial size was moderately dilated.  5. Moderate pericardial effusion. The pericardial effusion is posterior to the left ventricle. There is no evidence of cardiac tamponade.  6. The mitral valve is degenerative. Mild to moderate mitral valve regurgitation. No evidence of mitral stenosis. Moderate mitral annular calcification.  7. Tricuspid valve regurgitation is mild to moderate.  8. The aortic valve is tricuspid. Aortic valve regurgitation is not visualized. Aortic valve sclerosis is present, with no evidence of aortic valve stenosis.  9. The inferior vena cava is dilated in size  with <50% respiratory variability, suggesting right atrial pressure of 15 mmHg. FINDINGS  Left Ventricle: Left ventricular ejection fraction, by estimation, is 25 to 30%. The left ventricle has severely decreased function. The left ventricle demonstrates regional wall motion abnormalities. Definity contrast agent was given IV to delineate the left ventricular endocardial borders. The left ventricular internal cavity size  was normal in size. There is no left ventricular hypertrophy. Left ventricular diastolic parameters are indeterminate.  LV Wall Scoring: The mid and distal anterior wall, mid and distal anterior septum, mid and distal inferior wall, mid inferoseptal segment, and apex are akinetic. The entire lateral wall, basal anteroseptal segment, basal anterior segment, basal inferior segment, and basal inferoseptal segment are normal. Right Ventricle: The right ventricular size is normal. Right vetricular wall thickness was not well visualized. Right ventricular systolic function is moderately reduced. There is mildly elevated pulmonary artery systolic pressure. The tricuspid regurgitant velocity is 2.62 m/s, and with an assumed right atrial pressure of 15 mmHg, the estimated right ventricular systolic pressure is 42.5 mmHg. Left Atrium: Left atrial size was mildly dilated. Right Atrium: Right atrial size was moderately dilated. Pericardium: A moderately sized pericardial effusion is present. The pericardial effusion is posterior to the left ventricle. There is no evidence of cardiac tamponade. Mitral Valve: The mitral valve is degenerative in appearance. Moderate mitral annular calcification. Mild to moderate mitral valve regurgitation. No evidence of mitral valve stenosis. Tricuspid Valve: The tricuspid valve is normal in structure. Tricuspid valve regurgitation is mild to moderate. Aortic Valve: The aortic valve is tricuspid. Aortic valve regurgitation is not visualized. Aortic valve sclerosis is present, with no evidence of aortic valve stenosis. Aortic valve mean gradient measures 1.3 mmHg. Aortic valve peak gradient measures 2.4  mmHg. Aortic valve area, by VTI measures 1.52 cm. Pulmonic Valve: The pulmonic valve was not well visualized. Pulmonic valve regurgitation is not visualized. Aorta: The aortic root and ascending aorta are structurally normal, with no evidence of dilitation. Venous: The inferior vena cava is  dilated in size with less than 50% respiratory variability, suggesting right atrial pressure of 15 mmHg. IAS/Shunts: The interatrial septum was not well visualized.  LEFT VENTRICLE PLAX 2D LVIDd:         3.90 cm     Diastology LVIDs:         3.20 cm     LV e' medial:    6.02 cm/s LV PW:         0.80 cm     LV E/e' medial:  16.1 LV IVS:        0.70 cm     LV e' lateral:   6.67 cm/s LVOT diam:     1.80 cm     LV E/e' lateral: 14.5 LV SV:         17 LV SV Index:   10 LVOT Area:     2.54 cm  LV Volumes (MOD) LV vol d, MOD A2C: 43.1 ml LV vol d, MOD A4C: 54.7 ml LV vol s, MOD A2C: 36.0 ml LV vol s, MOD A4C: 35.0 ml LV SV MOD A2C:     7.1 ml LV SV MOD A4C:     54.7 ml LV SV MOD BP:      14.0 ml RIGHT VENTRICLE            IVC RV S prime:     7.51 cm/s  IVC diam: 2.20 cm TAPSE (M-mode): 1.8 cm LEFT ATRIUM  Index        RIGHT ATRIUM           Index LA diam:        3.90 cm 2.35 cm/m   RA Area:     21.80 cm LA Vol (A2C):   65.8 ml 39.57 ml/m  RA Volume:   79.50 ml  47.81 ml/m LA Vol (A4C):   56.2 ml 33.80 ml/m LA Biplane Vol: 61.4 ml 36.93 ml/m  AORTIC VALVE AV Area (Vmax):    1.68 cm AV Area (Vmean):   1.59 cm AV Area (VTI):     1.52 cm AV Vmax:           77.63 cm/s AV Vmean:          55.967 cm/s AV VTI:            0.112 m AV Peak Grad:      2.4 mmHg AV Mean Grad:      1.3 mmHg LVOT Vmax:         51.23 cm/s LVOT Vmean:        34.967 cm/s LVOT VTI:          0.067 m LVOT/AV VTI ratio: 0.60  AORTA Ao Root diam: 2.60 cm Ao Asc diam:  2.60 cm MITRAL VALVE               TRICUSPID VALVE MV Area (PHT): 8.01 cm    TR Peak grad:   27.5 mmHg MV Decel Time: 95 msec     TR Vmax:        262.00 cm/s MV E velocity: 96.93 cm/s                            SHUNTS                            Systemic VTI:  0.07 m                            Systemic Diam: 1.80 cm Epifanio Lesches MD Electronically signed by Epifanio Lesches MD Signature Date/Time: 01/23/2024/2:51:19 PM    Final    DG Chest Port 1 View Result Date:  01/22/2024 CLINICAL DATA:  83 year old female with possible sepsis. EXAM: PORTABLE CHEST 1 VIEW COMPARISON:  Chest radiographs 01/18/2024 and earlier. FINDINGS: Portable AP semi upright view at 0956 hours. Stable cardiomegaly and mediastinal contours. Larger lung volumes. Allowing for portable technique the lungs are clear. Visualized tracheal air column is within normal limits. Chronic proximal left humerus deformity. Stable visualized osseous structures. Negative visible bowel gas. IMPRESSION: Cardiomegaly.  No acute cardiopulmonary abnormality. Electronically Signed   By: Odessa Fleming M.D.   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 Cardiology consulted for elevated cardiac enzymes and A-fib with RVR.  Echocardiogram shows ejection fraction 25 to 30%, moderate RV dysfunction, mild left atrial enlargement, moderate right atrial enlargement, moderate pericardial effusion posterior to the left ventricle, mild to moderate mitral regurgitation, mild to moderate tricuspid regurgitation.  Assessment & Plan    1 atrial fibrillation with rapid ventricular  response -patient admitted with severe influenza A infection.  Patient's blood pressure has remained borderline (systolic blood pressure in the high 80s).  Her liver functions are mildly elevated but I feel at this point we should continue amiodarone.  May be forced to discontinue if LFTs continue to rise.  LV function is decreased.  I will discontinue Cardizem and instead treat with low-dose metoprolol.  Not on anticoagulation at home due to history of fall risk.    2 elevated troponin-patient's LV function is significantly  reduced.  However she is not a candidate for cardiac catheterization.  Will consider addition of guideline directed medical therapy later if blood pressure allows.  3 hyperlipidemia-statin on hold due to elevated liver functions.  4 history of dementia  5 no CODE BLUE  For questions or updates, please contact [facility] Please consult [email or web address] for contact info under        Signed, Olga Millers, MD  01/24/2024, 8:34 AM
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
## 2024-01-24 NOTE — Progress Notes (Signed)
  Progress Note   Patient: Lisa Larson NWG:[id number] DOB: 06/05/1941 DOA: 01/22/2024     2 DOS: the patient was seen and examined on 01/24/2024   Brief hospital course: 82yo with hx COPD who presented to ED with increased sob with cough x 3 days, initially noted to be hypoxemic needing bipap. Pt was found to be pos for flu. Pt found to be in afib with RVR and started on cardizem gtt. Cardiology following. Later transitioned to IV amiodarone  Assessment and Plan: Acute hypoxemic respiratory failure secondary to Flu A with COPD exacerbation -Pt noted to be pos for flu A -on cont on empiric rocephin and azithro -wean o2 as tolerated -Cont flutter valve as tolerated -cont neb tx. Currently on IV bid solumedrol  Paroxysmal afib with RVR -Cardiology following. LFT's are mildly elevated, however, cardiology recs to continue amiodarone. 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 -continued on IV steroids per above     Subjective: Without complaints  Physical Exam: Vitals:   01/24/24 1504 01/24/24 1600 01/24/24 1700 01/24/24 1730  BP:  (!) 83/68 99/73 (!) 109/95  Pulse:  98 93 84  Resp:  18 18 14   Temp: (!) 97.4 F (36.3 C)     TempSrc: Oral     SpO2:  98% 94% 99%  Weight:      Height:       General exam: Awake, laying in bed, in nad Respiratory system: Normal respiratory effort, active coughing Cardiovascular system: regular rate, s1, s2 Gastrointestinal system: Soft, nondistended, positive BS Central nervous system: CN2-12 grossly intact, strength intact Extremities: Perfused, no clubbing Skin: Normal skin turgor, no notable skin lesions seen Psychiatry: Mood normal // no visual hallucinations   Data Reviewed:  Labs  reviewed: Na 140, K 4.2, Cr 0.92, WBC 12.3, Hgb 11.5, Plts 111  Family Communication: Pt in room, family at bedside  Disposition: Status is: Inpatient Remains inpatient appropriate because: severity of illness  Planned Discharge Destination:  Pending PTOT eval    Author: Rickey Barbara, MD 01/24/2024 7:10 PM  For on call review [email or web address].
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
## 2024-01-25 ENCOUNTER — Inpatient Hospital Stay (HOSPITAL_COMMUNITY): Payer: Medicare Other

## 2024-01-25 DIAGNOSIS — I4891 Unspecified atrial fibrillation: Secondary | ICD-10-CM | POA: Diagnosis not present

## 2024-01-25 DIAGNOSIS — J111 Influenza due to unidentified influenza virus with other respiratory manifestations: Secondary | ICD-10-CM | POA: Diagnosis not present

## 2024-01-25 LAB — COMPREHENSIVE METABOLIC PANEL
ALT: 284 U/L — ABNORMAL HIGH (ref 0–44)
AST: 215 U/L — ABNORMAL HIGH (ref 15–41)
Albumin: 3 g/dL — ABNORMAL LOW (ref 3.5–5.0)
Alkaline Phosphatase: 39 U/L (ref 38–126)
Anion gap: 10 (ref 5–15)
BUN: 24 mg/dL — ABNORMAL HIGH (ref 8–23)
CO2: 23 mmol/L (ref 22–32)
Calcium: 8.8 mg/dL — ABNORMAL LOW (ref 8.9–10.3)
Chloride: 105 mmol/L (ref 98–111)
Creatinine, Ser: 0.99 mg/dL (ref 0.44–1.00)
GFR, Estimated: 58 mL/min — ABNORMAL LOW (ref >60)
Glucose, Bld: 129 mg/dL — ABNORMAL HIGH (ref 70–99)
Potassium: 4 mmol/L (ref 3.5–5.1)
Sodium: 138 mmol/L (ref 135–145)
Total Bilirubin: 0.3 mg/dL (ref 0.0–1.2)
Total Protein: 5.7 g/dL — ABNORMAL LOW (ref 6.5–8.1)

## 2024-01-25 LAB — CBC
HCT: 38.8 % (ref 36.0–46.0)
Hemoglobin: 12.3 g/dL (ref 12.0–15.0)
MCH: 30.9 pg (ref 26.0–34.0)
MCHC: 31.7 g/dL (ref 30.0–36.0)
MCV: 97.5 fL (ref 80.0–100.0)
Platelets: 126 10*3/uL — ABNORMAL LOW (ref 150–400)
RBC: 3.98 MIL/uL (ref 3.87–5.11)
RDW: 14.1 % (ref 11.5–15.5)
WBC: 9.1 10*3/uL (ref 4.0–10.5)
nRBC: 0.2 % (ref 0.0–0.2)

## 2024-01-25 LAB — GLUCOSE, CAPILLARY
Glucose-Capillary: 143 mg/dL — ABNORMAL HIGH (ref 70–99)
Glucose-Capillary: 160 mg/dL — ABNORMAL HIGH (ref 70–99)
Glucose-Capillary: 95 mg/dL (ref 70–99)
Glucose-Capillary: 113 mg/dL — ABNORMAL HIGH (ref 70–99)

## 2024-01-25 LAB — HEPARIN LEVEL (UNFRACTIONATED): Heparin Unfractionated: 0.44 [IU]/mL (ref 0.30–0.70)

## 2024-01-25 MED ORDER — OSELTAMIVIR PHOSPHATE 6 MG/ML PO SUSR
30.0000 mg | Freq: Two times a day (BID) | ORAL | Status: AC
  Administered 2024-01-25 – 2024-01-27 (×6): 30 mg via ORAL
  Filled 2024-01-25 (×2): qty 12.5
  Filled 2024-01-25: qty 5
  Filled 2024-01-25 (×3): qty 12.5

## 2024-01-25 MED ORDER — AMIODARONE HCL 200 MG PO TABS
200.0000 mg | ORAL_TABLET | Freq: Two times a day (BID) | ORAL | Status: DC
Start: 2024-01-25 — End: 2024-01-29
  Administered 2024-01-25 – 2024-01-29 (×9): 200 mg via ORAL
  Filled 2024-01-25 (×9): qty 1

## 2024-01-25 MED ORDER — METOPROLOL TARTRATE 25 MG PO TABS
25.0000 mg | ORAL_TABLET | Freq: Two times a day (BID) | ORAL | Status: DC
Start: 2024-01-25 — End: 2024-01-29
  Administered 2024-01-25 – 2024-01-29 (×9): 25 mg via ORAL
  Filled 2024-01-25 (×9): qty 1

## 2024-01-25 MED ORDER — HALOPERIDOL LACTATE 5 MG/ML IJ SOLN
5.0000 mg | Freq: Four times a day (QID) | INTRAMUSCULAR | Status: DC | PRN
  Administered 2024-01-25: 5 mg via INTRAVENOUS
  Filled 2024-01-25: qty 1

## 2024-01-25 NOTE — Progress Notes (Signed)
Heart Failure Navigator Progress Note  Assessed for Heart & Vascular TOC clinic readiness.  Patient does not meet criteria due to per MD note patient with Dementia. .   Navigator will sign off at this time.   Rhae Hammock, BSN, Scientist, clinical (histocompatibility and immunogenetics) Only
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
## 2024-01-25 NOTE — Evaluation (Signed)
Physical Therapy Evaluation Patient Details Name: Lisa Larson MRN: [id number] DOB: 10/08/1941 Today's Date: 01/25/2024  History of Present Illness  40 female admitted on 01/22/2024 with septic shock secondary to influenza pneumonia. PMHx: dementia, paroxysmal atrial fibrillation (not on anticoagulation due to fall risk, shared decision making between primary cardiologist and patient and family), COPD, frequent hallucinations related to dementia, prior tobacco abuse.  Clinical Impression  Pt admitted with above diagnosis. PTA she was living alone and received assistance with ADLs frequently from son. He feels he can no longer provide adequate care/assistance that she needs to remain safe at home alone. Pt required up to mod assist to stand and transfer from bed to recliner today. Patient will benefit from continued inpatient follow up therapy, <3 hours/day.  Pt currently with functional limitations due to the deficits listed below (see PT Problem List). Pt will benefit from acute skilled PT to increase their independence and safety with mobility to allow discharge.       BP 110/76 HR 107, SPO2 unable to obtain reading on 3L, RR 19.       If plan is discharge home, recommend the following: A lot of help with bathing/dressing/bathroom;Assistance with cooking/housework;Direct supervision/assist for medications management;Assist for transportation;A lot of help with walking and/or transfers;Assistance with feeding;Direct supervision/assist for financial management;Supervision due to cognitive status   Can travel by private vehicle   Yes    Equipment Recommendations None recommended by PT  Recommendations for Other Services       Functional Status Assessment Patient has had a recent decline in their functional status and demonstrates the ability to make significant improvements in function in a reasonable and predictable amount of time.     Precautions / Restrictions  Precautions Precautions: Fall Restrictions Weight Bearing Restrictions Per Provider Order: No      Mobility  Bed Mobility Overal bed mobility: Needs Assistance Bed Mobility: Supine to Sit     Supine to sit: Mod assist, HOB elevated, Used rails     General bed mobility comments: Mod assist for trunk support to rise and scoot to EOB. Overshoots and requires knee block to stabilize. Cues for technique.    Transfers Overall transfer level: Needs assistance Equipment used: 1 person hand held assist Transfers: Sit to/from Stand, Bed to chair/wheelchair/BSC Sit to Stand: Mod assist   Step pivot transfers: Mod assist       General transfer comment: Mod assist for boost and balance, reports chronic Rt knee pain with WB, antalgic requiring mod assist for upright stance and step pivot to recliner.  Cues for sequencing, attempting to sit prematurely.    Ambulation/Gait                  Stairs            Wheelchair Mobility     Tilt Bed    Modified Rankin (Stroke Patients Only)       Balance Overall balance assessment: Needs assistance Sitting-balance support: Bilateral upper extremity supported, Feet unsupported Sitting balance-Leahy Scale: Poor     Standing balance support: Bilateral upper extremity supported, During functional activity Standing balance-Leahy Scale: Poor                               Pertinent Vitals/Pain Pain Assessment Pain Assessment: Faces Faces Pain Scale: Hurts even more Pain Location: neck and HA Pain Descriptors / Indicators: Aching, Grimacing Pain Intervention(s): Monitored during session, Repositioned  Home Living Family/patient expects to be discharged to:: Private residence Living Arrangements: Alone Available Help at Discharge: Available PRN/intermittently;Family Type of Home: House Home Access: Ramped entrance       Home Layout: One level Home Equipment: Agricultural consultant (2 wheels);Cane - single  point;Shower seat;BSC/3in1;Wheelchair - manual Additional Comments: son checks on pt, several times a day    Prior Function Prior Level of Function : Needs assist             Mobility Comments: Uses RW to ambulate ADLs Comments: Son assists by making food, getting into shower. States she has required increasing assist lately due to hallucinations. Per son     Extremity/Trunk Assessment   Upper Extremity Assessment Upper Extremity Assessment: Defer to OT evaluation    Lower Extremity Assessment Lower Extremity Assessment: Generalized weakness    Cervical / Trunk Assessment Cervical / Trunk Assessment: Kyphotic;Other exceptions  Communication   Communication Communication: No apparent difficulties  Cognition Arousal: Alert Behavior During Therapy: WFL for tasks assessed/performed Overall Cognitive Status: Impaired/Different from baseline                                 General Comments: Oriented to self, place, date        General Comments General comments (skin integrity, edema, etc.): BP 110/76 HR 107, SPO2 unable to obtain reading on 3L, RR 19.    Exercises     Assessment/Plan    PT Assessment Patient needs continued PT services  PT Problem List Decreased strength;Decreased cognition;Decreased knowledge of precautions;Decreased mobility;Decreased range of motion;Decreased activity tolerance;Pain;Decreased safety awareness;Decreased balance;Decreased knowledge of use of DME;Cardiopulmonary status limiting activity       PT Treatment Interventions DME instruction;Therapeutic activities;Cognitive remediation;Gait training;Therapeutic exercise;Patient/family education;Functional mobility training;Balance training;Neuromuscular re-education;Wheelchair mobility training;Modalities    PT Goals (Current goals can be found in the Care Plan section)  Acute Rehab PT Goals Patient Stated Goal: Feel better PT Goal Formulation: With family Time For Goal  Achievement: 02/08/24 Potential to Achieve Goals: Fair    Frequency Min 1X/week     Co-evaluation               AM-PAC PT "6 Clicks" Mobility  Outcome Measure Help needed turning from your back to your side while in a flat bed without using bedrails?: A Lot Help needed moving from lying on your back to sitting on the side of a flat bed without using bedrails?: A Lot Help needed moving to and from a bed to a chair (including a wheelchair)?: A Lot Help needed standing up from a chair using your arms (e.g., wheelchair or bedside chair)?: A Lot Help needed to walk in hospital room?: Total Help needed climbing 3-5 steps with a railing? : Total 6 Click Score: 10    End of Session Equipment Utilized During Treatment: Gait belt Activity Tolerance: Patient tolerated treatment well Patient left: in chair;with call bell/phone within reach;with chair alarm set;with SCD's reapplied Nurse Communication: Mobility status PT Visit Diagnosis: Unsteadiness on feet (R26.81);Difficulty in walking, not elsewhere classified (R26.2);Pain;History of falling (Z91.81);Muscle weakness (generalized) (M62.81);Other symptoms and signs involving the nervous system (R29.898) Pain - Right/Left: Right Pain - part of body: Knee (neck)    Time: 1010-1047 PT Time Calculation (min) (ACUTE ONLY): 37 min   Charges:   PT Evaluation $PT Eval Moderate Complexity: 1 Mod PT Treatments $Therapeutic Activity: 8-22 mins PT General Charges $$ ACUTE PT VISIT: 1 Visit  Kathlyn [place], PT, DPT [facility]  Rehabilitation Services Physical Therapist Office: [phone] Website: [email or web address]   Berton Mount 01/25/2024, 11:38 AM

## 2024-01-25 NOTE — Progress Notes (Signed)
[facility] night cross cover note:  I was notified by RN that this patient, with underlying dementia, is agitated, repeatedly attempting to get out of bed, with these actions refractory to attempts at verbal redirection. Similar was noted last evening, at which existing order for Seroquel was ineffective.  In the setting of associated interference with ongoing medical treatment posing potential harm to themself, I placed for prn IV Haldol for agitation.    Lisa Pigg, DO Hospitalist

## 2024-01-25 NOTE — Progress Notes (Signed)
[facility] called after pt complaining of headaches and visual changes. Pt on hep gtt and per [facility] pause gtt hep.

## 2024-01-25 NOTE — Progress Notes (Signed)
Rounding Note    Patient Name: Lisa Larson Date of Encounter: 01/25/2024  [facility] Cardiologist: Thomasene Ripple, DO   Subjective   Denies CP; mild dyspnea persists but improving  Inpatient Medications    Scheduled Meds:  arformoterol  15 mcg Nebulization BID   budesonide (PULMICORT) nebulizer solution  0.5 mg Nebulization BID   Chlorhexidine Gluconate Cloth  6 each Topical Daily   feeding supplement  237 mL Oral BID BM   gabapentin  300 mg Oral TID   insulin aspart  0-9 Units Subcutaneous TID WC   methylPREDNISolone (SOLU-MEDROL) injection  40 mg Intravenous Q12H   metoprolol tartrate  12.5 mg Oral BID   mouth rinse  15 mL Mouth Rinse 4 times per day   oseltamivir  30 mg Oral BID   QUEtiapine  25 mg Oral Q lunch   QUEtiapine  50 mg Oral QHS   revefenacin  175 mcg Nebulization Daily   Continuous Infusions:  amiodarone 30 mg/hr (01/25/24 0407)   azithromycin Stopped (01/24/24 1324)   cefTRIAXone (ROCEPHIN)  IV Stopped (01/24/24 1158)   heparin 650 Units/hr (01/25/24 0400)   PRN Meds: acetaminophen, docusate sodium, levalbuterol, loperamide, ondansetron (ZOFRAN) IV, mouth rinse, polyethylene glycol   Vital Signs    Vitals:   01/25/24 0700 01/25/24 0800 01/25/24 0814 01/25/24 0840  BP: (!) 122/96 123/86 (!) 136/112   Pulse: (!) 101 (!) 111 (!) 115 (!) 106  Resp: 18 (!) 21 18 20   Temp:   (!) 97.5 F (36.4 C)   TempSrc:   Axillary   SpO2: 98% 93% 98% 98%  Weight:      Height:        Intake/Output Summary (Last 24 hours) at 01/25/2024 0943 Last data filed at 01/25/2024 0814 Gross per 24 hour  Intake 1029.4 ml  Output 800 ml  Net 229.4 ml      01/25/2024    4:08 AM 01/23/2024    5:00 AM 01/22/2024    2:15 PM  Last 3 Weights  Weight (lbs) 151 lb 7.3 oz 140 lb 3.4 oz 139 lb 12.4 oz  Weight (kg) 68.7 kg 63.6 kg 63.4 kg      Telemetry    Atrial fibrillation with PVCs or aberrantly conducted beats rate controlled to mildly elevated.-  Personally Reviewed   Physical Exam   GEN: NAD, WD Neck: supple, no TM Cardiac: irregular, no rub Respiratory: CTA, no wheeze GI: Soft, NT/ND, no masses MS: No edema Neuro:  No focal findings  Labs    High Sensitivity Troponin:   Recent Labs  Lab 01/18/24 1450 01/22/24 0955 01/22/24 1332  TROPONINIHS 11 1,573* 1,446*     Chemistry Recent Labs  Lab 01/22/24 0955 01/22/24 0956 01/23/24 0456 01/24/24 0213 01/25/24 0304  NA 143   < > 138 140 138  K 3.6   < > 4.0 4.2 4.0  CL 105  --  104 106 105  CO2 22  --  24 25 23   GLUCOSE 161*  --  116* 129* 129*  BUN 22  --  19 21 24*  CREATININE 1.86*   < > 0.97 0.92 0.99  CALCIUM 9.5  --  8.9 8.6* 8.8*  MG 1.9  --  2.0  --   --   PROT 7.2  --  5.7*  --  5.7*  ALBUMIN 4.0  --  3.0*  --  3.0*  AST 244*  --  383*  --  215*  ALT 165*  --  281*  --  284*  ALKPHOS 50  --  47  --  39  BILITOT 0.7  --  0.5  --  0.3  GFRNONAA 27*   < > 59* >60 58*  ANIONGAP 16*  --  10 9 10    < > = values in this interval not displayed.    Hematology Recent Labs  Lab 01/23/24 0456 01/24/24 0213 01/25/24 0304  WBC 10.3 12.3* 9.1  RBC 3.93 3.78* 3.98  HGB 12.2 11.5* 12.3  HCT 38.9 36.5 38.8  MCV 99.0 96.6 97.5  MCH 31.0 30.4 30.9  MCHC 31.4 31.5 31.7  RDW 14.2 14.3 14.1  PLT 120* 111* 126*   Thyroid  Recent Labs  Lab 01/18/24 1450 01/18/24 1600  TSH 1.429  --   FREET4  --  0.86    BNP Recent Labs  Lab 01/18/24 1600 01/22/24 0955  BNP 213.3* 301.6*     Radiology    CT HEAD WO CONTRAST ( ) Result Date: 01/25/2024 CLINICAL DATA:  83 year old female with headache and neurologic deficit. Altered mental status. EXAM: CT HEAD WITHOUT CONTRAST TECHNIQUE: Contiguous axial images were obtained from the base of the skull through the vertex without intravenous contrast. RADIATION DOSE REDUCTION: This exam was performed according to the departmental dose-optimization program which includes automated exposure control, adjustment of the  mA and/or kV according to patient size and/or use of iterative reconstruction technique. COMPARISON:  Head CT 01/18/2024 and earlier. FINDINGS: Brain: Stable cerebral volume. No midline shift, mass effect, or evidence of intracranial mass lesion. No acute intracranial hemorrhage identified. Stable ventricle size and configuration. Patchy bilateral white matter hypodensity is stable. No cortically based acute infarct identified. Vascular: Calcified atherosclerosis at the skull base. No suspicious intracranial vascular hyperdensity. Skull: Advanced chronic C1-C2 degeneration. No acute osseous abnormality identified. Sinuses/Orbits: Acute on chronic paranasal sinus disease with increased sphenoid fluid levels and opacification superimposed on widespread sinus periosteal thickening. Tympanic cavities and mastoids remain well aerated. Other: No acute orbit or scalp soft tissue finding. IMPRESSION: 1.  No acute intracranial abnormality. 2. 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 Electronically Signed   By: Odessa Fleming M.D.   On: 01/25/2024 09:37   ECHOCARDIOGRAM COMPLETE Result Date: 01/23/2024    ECHOCARDIOGRAM REPORT   Patient Name:   Lisa Larson Date of Exam: 01/23/2024 Medical Rec #:  [id number]           Height:       63.0 in Accession #:    [id number]          Weight:       140.2 lb Date of Birth:  May 28, 1941           BSA:          1.663 m Patient Age:    82 years            BP:           107/85 mmHg Patient Gender: F                   HR:           126 bpm. Exam Location:  Inpatient Procedure: 2D Echo, Cardiac Doppler, Color Doppler and Intracardiac            Opacification Agent Indications:    Dyspnea  History:        Patient has prior history of Echocardiogram examinations, most  recent 03/04/2022. Risk Factors:Hypertension and Former Smoker.  Sonographer:    Karma Ganja Referring Phys: [id number] Perlie Gold  Sonographer Comments: Technically challenging study due to limited acoustic windows.  Image acquisition challenging due to uncooperative patient. IMPRESSIONS  1. Left ventricular ejection fraction, by estimation, is 25 to 30%. The left ventricle has severely decreased function. The left ventricle demonstrates regional wall motion abnormalities (see scoring diagram/findings for description). Left ventricular diastolic parameters are indeterminate.  2. Right ventricular systolic function is moderately reduced. The right ventricular size is normal. There is mildly elevated pulmonary artery systolic pressure. The estimated right ventricular systolic pressure is 42.5 mmHg.  3. Left atrial size was mildly dilated.  4. Right atrial size was moderately dilated.  5. Moderate pericardial effusion. The pericardial effusion is posterior to the left ventricle. There is no evidence of cardiac tamponade.  6. The mitral valve is degenerative. Mild to moderate mitral valve regurgitation. No evidence of mitral stenosis. Moderate mitral annular calcification.  7. Tricuspid valve regurgitation is mild to moderate.  8. The aortic valve is tricuspid. Aortic valve regurgitation is not visualized. Aortic valve sclerosis is present, with no evidence of aortic valve stenosis.  9. The inferior vena cava is dilated in size with <50% respiratory variability, suggesting right atrial pressure of 15 mmHg. FINDINGS  Left Ventricle: Left ventricular ejection fraction, by estimation, is 25 to 30%. The left ventricle has severely decreased function. The left ventricle demonstrates regional wall motion abnormalities. Definity contrast agent was given IV to delineate the left ventricular endocardial borders. The left ventricular internal cavity size was normal in size. There is no left ventricular hypertrophy. Left ventricular diastolic parameters are indeterminate.  LV Wall Scoring: The mid and distal anterior wall, mid and distal anterior septum, mid and distal inferior wall, mid inferoseptal segment, and apex are akinetic. The entire  lateral wall, basal anteroseptal segment, basal anterior segment, basal inferior segment, and basal inferoseptal segment are normal. Right Ventricle: The right ventricular size is normal. Right vetricular wall thickness was not well visualized. Right ventricular systolic function is moderately reduced. There is mildly elevated pulmonary artery systolic pressure. The tricuspid regurgitant velocity is 2.62 m/s, and with an assumed right atrial pressure of 15 mmHg, the estimated right ventricular systolic pressure is 42.5 mmHg. Left Atrium: Left atrial size was mildly dilated. Right Atrium: Right atrial size was moderately dilated. Pericardium: A moderately sized pericardial effusion is present. The pericardial effusion is posterior to the left ventricle. There is no evidence of cardiac tamponade. Mitral Valve: The mitral valve is degenerative in appearance. Moderate mitral annular calcification. Mild to moderate mitral valve regurgitation. No evidence of mitral valve stenosis. Tricuspid Valve: The tricuspid valve is normal in structure. Tricuspid valve regurgitation is mild to moderate. Aortic Valve: The aortic valve is tricuspid. Aortic valve regurgitation is not visualized. Aortic valve sclerosis is present, with no evidence of aortic valve stenosis. Aortic valve mean gradient measures 1.3 mmHg. Aortic valve peak gradient measures 2.4  mmHg. Aortic valve area, by VTI measures 1.52 cm. Pulmonic Valve: The pulmonic valve was not well visualized. Pulmonic valve regurgitation is not visualized. Aorta: The aortic root and ascending aorta are structurally normal, with no evidence of dilitation. Venous: The inferior vena cava is dilated in size with less than 50% respiratory variability, suggesting right atrial pressure of 15 mmHg. IAS/Shunts: The interatrial septum was not well visualized.  LEFT VENTRICLE PLAX 2D LVIDd:         3.90 cm  Diastology LVIDs:         3.20 cm     LV e' medial:    6.02 cm/s LV PW:          0.80 cm     LV E/e' medial:  16.1 LV IVS:        0.70 cm     LV e' lateral:   6.67 cm/s LVOT diam:     1.80 cm     LV E/e' lateral: 14.5 LV SV:         17 LV SV Index:   10 LVOT Area:     2.54 cm  LV Volumes (MOD) LV vol d, MOD A2C: 43.1 ml LV vol d, MOD A4C: 54.7 ml LV vol s, MOD A2C: 36.0 ml LV vol s, MOD A4C: 35.0 ml LV SV MOD A2C:     7.1 ml LV SV MOD A4C:     54.7 ml LV SV MOD BP:      14.0 ml RIGHT VENTRICLE            IVC RV S prime:     7.51 cm/s  IVC diam: 2.20 cm TAPSE (M-mode): 1.8 cm LEFT ATRIUM             Index        RIGHT ATRIUM           Index LA diam:        3.90 cm 2.35 cm/m   RA Area:     21.80 cm LA Vol (A2C):   65.8 ml 39.57 ml/m  RA Volume:   79.50 ml  47.81 ml/m LA Vol (A4C):   56.2 ml 33.80 ml/m LA Biplane Vol: 61.4 ml 36.93 ml/m  AORTIC VALVE AV Area (Vmax):    1.68 cm AV Area (Vmean):   1.59 cm AV Area (VTI):     1.52 cm AV Vmax:           77.63 cm/s AV Vmean:          55.967 cm/s AV VTI:            0.112 m AV Peak Grad:      2.4 mmHg AV Mean Grad:      1.3 mmHg LVOT Vmax:         51.23 cm/s LVOT Vmean:        34.967 cm/s LVOT VTI:          0.067 m LVOT/AV VTI ratio: 0.60  AORTA Ao Root diam: 2.60 cm Ao Asc diam:  2.60 cm MITRAL VALVE               TRICUSPID VALVE MV Area (PHT): 8.01 cm    TR Peak grad:   27.5 mmHg MV Decel Time: 95 msec     TR Vmax:        262.00 cm/s MV E velocity: 96.93 cm/s                            SHUNTS                            Systemic VTI:  0.07 m                            Systemic Diam: 1.80 cm Epifanio Lesches MD Electronically signed by Epifanio Lesches MD Signature Date/Time: 01/23/2024/2:51:19  PM    Final     Patient Profile     83 year old female with dementia, paroxysmal atrial fibrillation (not on anticoagulation due to fall risk, shared decision making between primary cardiologist and patient and family), COPD, 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 Cardiology consulted for elevated cardiac enzymes and A-fib with RVR.  Echocardiogram shows ejection fraction 25 to 30%, moderate RV dysfunction, mild left atrial enlargement, moderate right atrial enlargement, moderate pericardial effusion posterior to the left ventricle, mild to moderate mitral regurgitation, mild to moderate tricuspid regurgitation.  Assessment & Plan    1 atrial fibrillation with rapid ventricular response -patient admitted with severe influenza A infection.  Patient's initial blood pressure was borderline.  She was therefore initiated on amiodarone for rate control.  Liver functions remain mildly elevated but improving.  Will change IV amiodarone to p.o. 200 mg twice daily for 1 week then 200 mg daily thereafter.  Increase metoprolol to 25 mg twice daily.  Will advance as tolerated by blood pressure and potentially discontinue amiodarone if possible.  Patient is on IV heparin.  She is felt not to be a long-term anticoagulation candidate due to risk of fall.  2 elevated troponin-patient's LV function is significantly reduced.  However she is not a candidate for cardiac catheterization.  Will consider addition of guideline directed medical therapy later if blood pressure allows.  Will transition metoprolol to Toprol later once dose is stable.  3 hyperlipidemia-statin on hold due to elevated liver functions.  4 history of dementia  5 no CODE BLUE  For questions or updates, please contact [facility] Please consult [email or web address] for contact info under        Signed, Olga Millers, MD  01/25/2024, 9:43 AM

## 2024-01-25 NOTE — Progress Notes (Signed)
  Progress Note   Patient: Lisa Larson WUJ:[id number] DOB: 08-03-1941 DOA: 01/22/2024     3 DOS: the patient was seen and examined on 01/25/2024   Brief hospital course: 83yo with hx COPD who presented to ED with increased sob with cough x 3 days, initially noted to be hypoxemic needing bipap. Pt was found to be pos for flu. Pt found to be in afib with RVR and started on cardizem gtt. Cardiology following. Later transitioned to IV amiodarone  Assessment and Plan: Acute hypoxemic respiratory failure secondary to Flu A with COPD exacerbation -Pt noted to be pos for flu A -on cont on empiric rocephin and azithro -wean o2 as tolerated -Cont flutter valve as tolerated -cont neb tx. Currently on IV bid solumedrol  Paroxysmal afib with RVR -Cardiology following.  -amiodarone changed from IV to PO 200mg  BID x 1 week, then 200mg  daily thereafter.  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 -continued on IV steroids per above -Likely transition to PO steroids soon     Subjective: complains of headache this AM, improved with turning off lights  Physical Exam: Vitals:   01/25/24 1142 01/25/24 1200 01/25/24 1300 01/25/24 1530  BP:  112/78 104/81   Pulse:      Resp:  (!) 22 20   Temp: 99.8 F (37.7 C)   98.7 F (37.1 C)  TempSrc: Oral   Oral  SpO2:      Weight:      Height:       General exam: Conversant, in no acute distress Respiratory system: normal chest rise, clear, no audible wheezing Cardiovascular system: regular rhythm, s1-s2 Gastrointestinal system: Nondistended, nontender, pos BS Central nervous system: No seizures, no tremors Extremities: No cyanosis, no joint deformities Skin: No rashes, no pallor Psychiatry:  Affect normal // no auditory hallucinations   Data Reviewed:  Labs reviewed: Na 138, K 4.0, Cr 0.99, AST 215, ALT 284hgb 12.3  Family Communication: Pt in room, family at bedside  Disposition: Status is: Inpatient Remains inpatient appropriate because: severity of illness  Planned Discharge Destination:  SNF    Author: Rickey Barbara, MD 01/25/2024 4:10 PM  For on call review [email or web address].

## 2024-01-26 ENCOUNTER — Ambulatory Visit: Payer: Medicare Other | Admitting: Internal Medicine

## 2024-01-26 DIAGNOSIS — J111 Influenza due to unidentified influenza virus with other respiratory manifestations: Secondary | ICD-10-CM | POA: Diagnosis not present

## 2024-01-26 LAB — CBC
HCT: 37.9 % (ref 36.0–46.0)
Hemoglobin: 12.2 g/dL (ref 12.0–15.0)
MCH: 31.1 pg (ref 26.0–34.0)
MCHC: 32.2 g/dL (ref 30.0–36.0)
MCV: 96.7 fL (ref 80.0–100.0)
Platelets: 119 10*3/uL — ABNORMAL LOW (ref 150–400)
RBC: 3.92 MIL/uL (ref 3.87–5.11)
RDW: 14.1 % (ref 11.5–15.5)
WBC: 7.9 10*3/uL (ref 4.0–10.5)
nRBC: 0.3 % — ABNORMAL HIGH (ref 0.0–0.2)

## 2024-01-26 LAB — COMPREHENSIVE METABOLIC PANEL
ALT: 212 U/L — ABNORMAL HIGH (ref 0–44)
AST: 109 U/L — ABNORMAL HIGH (ref 15–41)
Albumin: 2.8 g/dL — ABNORMAL LOW (ref 3.5–5.0)
Alkaline Phosphatase: 31 U/L — ABNORMAL LOW (ref 38–126)
Anion gap: 10 (ref 5–15)
BUN: 27 mg/dL — ABNORMAL HIGH (ref 8–23)
CO2: 24 mmol/L (ref 22–32)
Calcium: 8.8 mg/dL — ABNORMAL LOW (ref 8.9–10.3)
Chloride: 108 mmol/L (ref 98–111)
Creatinine, Ser: 1.03 mg/dL — ABNORMAL HIGH (ref 0.44–1.00)
GFR, Estimated: 55 mL/min — ABNORMAL LOW (ref >60)
Glucose, Bld: 105 mg/dL — ABNORMAL HIGH (ref 70–99)
Potassium: 4.2 mmol/L (ref 3.5–5.1)
Sodium: 142 mmol/L (ref 135–145)
Total Bilirubin: 0.5 mg/dL (ref 0.0–1.2)
Total Protein: 5.1 g/dL — ABNORMAL LOW (ref 6.5–8.1)

## 2024-01-26 LAB — GLUCOSE, CAPILLARY
Glucose-Capillary: 93 mg/dL (ref 70–99)
Glucose-Capillary: 105 mg/dL — ABNORMAL HIGH (ref 70–99)
Glucose-Capillary: 168 mg/dL — ABNORMAL HIGH (ref 70–99)

## 2024-01-26 MED ORDER — PREDNISONE 20 MG PO TABS
40.0000 mg | ORAL_TABLET | Freq: Every day | ORAL | Status: DC
Start: 2024-01-27 — End: 2024-01-29
  Administered 2024-01-27 – 2024-01-28 (×2): 40 mg via ORAL
  Filled 2024-01-26 (×4): qty 2

## 2024-01-26 MED ORDER — ENOXAPARIN SODIUM 40 MG/0.4ML IJ SOSY
40.0000 mg | PREFILLED_SYRINGE | INTRAMUSCULAR | Status: DC
  Administered 2024-01-26 – 2024-01-29 (×4): 40 mg via SUBCUTANEOUS
  Filled 2024-01-26 (×4): qty 0.4

## 2024-01-26 NOTE — Progress Notes (Signed)
[facility] night cross cover note:   I was notified by RN that the patient has been more drowsy today, sleeping throughout much of the day. Patient has not been showing e/o agitation this evening thus far.  In light of the above, will hold next doses of currently scheduled Seroquel and gabapentin, which were ordered for 2200 this evening.   Newton Pigg, DO Hospitalist

## 2024-01-26 NOTE — [place] FL2 (Signed)
[place] MEDICAID FL2 LEVEL OF CARE FORM     IDENTIFICATION  Patient Name: Lisa Larson Birthdate: [birth year] Sex: female Admission Date (Current Location): 01/22/2024  [facility] [place] Number:  Producer, television/film/video and Address:  [facility], 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) 01/23/2024   Acute respiratory failure (HCC) 01/22/2024   Headache 11/11/2023   Alzheimer's dementia with behavioral disturbance (HCC) 10/06/2023   Altered mental status 09/08/2023   Lower back pain 07/16/2023   Fall 07/16/2023   Hallucination, visual 07/16/2023   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   Dental abscess 07/03/2022   Closed femur fracture (HCC) 03/03/2022   Atrial fibrillation with rapid ventricular response (HCC) 03/03/2022   Severe persistent asthma 02/24/2021   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   Urinary frequency 05/26/2020   Urinary incontinence 05/25/2020   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   Lightheadedness 07/09/2018   Osteoporosis 12/22/2016   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   Family history of diabetes mellitus 09/22/2016   Depression 03/24/2015   Spine pain, multilevel 02/06/2015   Closed fracture of odontoid process of axis (HCC) 02/02/2015   Allergic rhinitis 04/17/2011   Hyperglycemia 11/28/2010   Asthma, persistent controlled 11/28/2010   GOUT, UNSPECIFIED 05/04/2009    Anxiety 09/29/2008   Essential hypertension 08/09/2008   GERD 09/29/2007   Osteoarthritis, diffuse 06/09/2007    Orientation RESPIRATION BLADDER Height & Weight     Self, Place, Time, Situation  O2 (2L) Incontinent Weight: 151 lb 7.3 oz (68.7 kg) Height:  5\' 3"  (160 cm)  BEHAVIORAL SYMPTOMS/MOOD NEUROLOGICAL BOWEL NUTRITION STATUS      Incontinent Diet  AMBULATORY STATUS COMMUNICATION OF NEEDS Skin   Extensive Assist Verbally Other (Comment) (Wound / Incision (Open or Dehisced) 01/22/24 Irritant Dermatitis (Moisture Associated Skin Damage) Abdomen Lower)                       Personal Care Assistance Level of Assistance  Bathing, Feeding, Dressing Bathing Assistance: Maximum assistance Feeding assistance: Limited assistance Dressing Assistance: Maximum assistance     Functional Limitations Info  Sight, Hearing, Speech Sight Info: Adequate Hearing Info: Adequate Speech Info: Adequate    SPECIAL CARE FACTORS FREQUENCY  PT (By licensed PT), OT (By licensed OT)     PT Frequency: 5x/week OT Frequency: 5x/week            Contractures Contractures Info: Not present    Additional Factors Info  Code Status, Allergies Code Status Info: DNR Allergies Info: Diclofenac  Other  Oxycodone  Rofecoxib  Sulfa Antibiotics  Sulfonamide Derivatives  Oxycodone-aspirin  Prolia (Denosumab)  Ace Inhibitors  Benazepril Hcl  Tramadol           Current Medications (01/26/2024):  This is the current hospital active medication list Current Facility-Administered Medications  Medication Dose Route Frequency Provider Last Rate Last Admin   acetaminophen (TYLENOL) tablet 650 mg  650 mg Oral Q6H  PRN Cheri Fowler, MD   650 mg at 01/26/24 0840   amiodarone (PACERONE) tablet 200 mg  200 mg Oral BID Lewayne Bunting, MD   200 mg at 01/26/24 0841   arformoterol (BROVANA) nebulizer solution 15 mcg  15 mcg Nebulization BID Selmer Dominion B, NP   15 mcg at 01/26/24 1015   budesonide (PULMICORT)  nebulizer solution 0.5 mg  0.5 mg Nebulization BID Selmer Dominion B, NP   0.5 mg at 01/26/24 1015   Chlorhexidine Gluconate Cloth 2 % PADS 6 each  6 each Topical Daily Cheri Fowler, MD   6 each at 01/26/24 1000   docusate sodium (COLACE) capsule 100 mg  100 mg Oral BID PRN Cheri Fowler, MD       enoxaparin (LOVENOX) injection 40 mg  40 mg Subcutaneous Q24H Jerald Kief, MD   40 mg at 01/26/24 1326   feeding supplement (ENSURE ENLIVE / ENSURE PLUS) liquid 237 mL  237 mL Oral BID BM Jerald Kief, MD   237 mL at 01/25/24 1121   gabapentin (NEURONTIN) capsule 300 mg  300 mg Oral TID Selmer Dominion B, NP   300 mg at 01/26/24 0841   haloperidol lactate (HALDOL) injection 5 mg  5 mg Intravenous Q6H PRN Howerter, Justin B, DO   5 mg at 01/25/24 2121   insulin aspart (novoLOG) injection 0-9 Units  0-9 Units Subcutaneous TID WC Selmer Dominion B, NP   2 Units at 01/26/24 1126   levalbuterol (XOPENEX) nebulizer solution 0.63 mg  0.63 mg Nebulization Q6H PRN Selmer Dominion B, NP   0.63 mg at 01/24/24 2053   loperamide (IMODIUM) capsule 2 mg  2 mg Oral TID PRN Selmer Dominion B, NP       methylPREDNISolone sodium succinate (SOLU-MEDROL) 40 mg/mL injection 40 mg  40 mg Intravenous Q12H Selmer Dominion B, NP   40 mg at 01/26/24 1102   metoprolol tartrate (LOPRESSOR) tablet 25 mg  25 mg Oral BID Lewayne Bunting, MD   25 mg at 01/26/24 0840   ondansetron (ZOFRAN) injection 4 mg  4 mg Intravenous Q6H PRN Cheri Fowler, MD       Oral care mouth rinse  15 mL Mouth Rinse 4 times per day Cheri Fowler, MD   15 mL at 01/26/24 1227   Oral care mouth rinse  15 mL Mouth Rinse PRN Cheri Fowler, MD       oseltamivir (TAMIFLU) 6 MG/ML suspension 30 mg  30 mg Oral BID Jerald Kief, MD   30 mg at 01/26/24 0841   polyethylene glycol (MIRALAX / GLYCOLAX) packet 17 g  17 g Oral Daily PRN Cheri Fowler, MD       QUEtiapine (SEROQUEL) tablet 25 mg  25 mg Oral Q lunch Carilyn Goodpasture, MD   25 mg at 01/25/24 1110   QUEtiapine  (SEROQUEL) tablet 50 mg  50 mg Oral Leanord Hawking, MD   50 mg at 01/25/24 2130   revefenacin (YUPELRI) nebulizer solution 175 mcg  175 mcg Nebulization Daily Selmer Dominion B, NP   175 mcg at 01/26/24 1015     Discharge Medications: Please see discharge summary for a list of discharge medications.  Relevant Imaging Results:  Relevant Lab Results:   Additional Information SSN:  [id number]  Andon Villard A [place], [place]

## 2024-01-26 NOTE — TOC Progression Note (Addendum)
Transition of Care [facility]) - Progression Note    Patient Details  Name: Lisa Larson MRN: [id number] Date of Birth: 1941/10/26  Transition of Care [facility]) CM/SW Contact  Barrett Goldie A [place], [place] Phone Number: 01/26/2024, 1:29 PM  Clinical Narrative:     Update 01/26/24 1654 CSW met with pt, pt's son and pt's daughter at bedside to discuss dispo plan. They stated that they had been to [facility] in the past but requested [facility] for rehab. CSW to reach out to facility for potential bed offer. Palliative consult requested. Provider notified.   CSW met with pt at bedside and spoke with pt and pt's son Will. He stated that pt made attempt to get to rehab in the past but was declined but was agreeable if able to get into a facility this hospital admission. He requested CSW relay information on pt's disposition plan for SNF to pt's daughter as well. CSW to follow up.   SNF workup completed. Bed offers pending.    TOC will continue to follow.       Expected Discharge Plan and Services                                               Social Determinants of Health (SDOH) Interventions SDOH Screenings   Food Insecurity: Patient Unable To Answer (01/25/2024)  Housing: Patient Unable To Answer (01/25/2024)  Transportation Needs: Patient Unable To Answer (01/25/2024)  Utilities: Patient Unable To Answer (01/25/2024)  Alcohol Screen: Low Risk  (08/01/2022)  Depression (PHQ2-9): Medium Risk (08/03/2023)  Financial Resource Strain: Low Risk  (08/03/2023)  Physical Activity: Inactive (08/03/2023)  Social Connections: Unknown (01/25/2024)  Stress: No Stress Concern Present (08/03/2023)  Recent Concern: Stress - Stress Concern Present (05/08/2023)  Tobacco Use: Medium Risk (01/22/2024)  Health Literacy: Adequate Health Literacy (08/03/2023)    Readmission Risk Interventions     No data to display

## 2024-01-26 NOTE — Progress Notes (Signed)
  Progress Note   Patient: Lisa Larson UJW:[id number] DOB: 14-Apr-1941 DOA: 01/22/2024     4 DOS: the patient was seen and examined on 01/26/2024   Brief hospital course: 82yo with hx COPD who presented to ED with increased sob with cough x 3 days, initially noted to be hypoxemic needing bipap. Pt was found to be pos for flu. Pt found to be in afib with RVR and started on cardizem gtt. Cardiology following. Later transitioned to IV amiodarone  Assessment and Plan: Acute hypoxemic respiratory failure secondary to Flu A with COPD exacerbation -Pt noted to be pos for flu A -on cont on empiric rocephin and azithro -wean o2 as tolerated -Cont flutter valve as tolerated -cont neb tx. Currently on IV bid solumedrol, will wean to prednisone  Paroxysmal afib with RVR -Cardiology following.  -amiodarone changed from IV to PO 200mg  BID x 1 week, then 200mg  daily thereafter.  -Metoprolol recently increased to 25mg  bid -Pt is not felt to be candidate for anticoag due to risk of fall -Not candidate for heart cath given cardiology  ARF -Improved with hydration  Toxic metabolic encephalopathy with dementia  -noted to have improved  Adrenal insufficiency on chronic prednisone -continued on IV steroids per above -Transition to 40mg  prednisone. Ultimately, goal is to wean to pt's baseline daily 5mg  prednisone as tolerated     Subjective: Without complaints. Staff reports confusion overnight, received haldol   Physical Exam: Vitals:   01/26/24 1430 01/26/24 1500 01/26/24 1523 01/26/24 1530  BP: 104/75 106/75  105/75  Pulse: 69 69  74  Resp: 18 18  18   Temp:   (!) 97.3 F (36.3 C)   TempSrc:   Axillary   SpO2: 98% 95%  98%  Weight:      Height:       General exam: Awake, laying in bed, in nad Respiratory system: Normal respiratory effort, no wheezing Cardiovascular system: regular rate, s1, s2 Gastrointestinal system: Soft, nondistended, positive BS Central nervous system:  CN2-12 grossly intact, strength intact Extremities: Perfused, no clubbing Skin: Normal skin turgor, no notable skin lesions seen Psychiatry: Difficult to assess given mentation  Data Reviewed:  Labs reviewed: Na 142, K 4.2, Cr 1.03, WBC 7.9, hgb 12.2  Family Communication: Pt in room, family not at bedside  Disposition: Status is: Inpatient Remains inpatient appropriate because: severity of illness  Planned Discharge Destination:  SNF    Author: Rickey Barbara, MD 01/26/2024 3:33 PM  For on call review [email or web address].

## 2024-01-26 NOTE — Plan of Care (Signed)
  Problem: Coping: Goal: Ability to adjust to condition or change in health will improve Outcome: Progressing   Problem: Fluid Volume: Goal: Ability to maintain a balanced intake and output will improve Outcome: Progressing   Problem: Health Behavior/Discharge Planning: Goal: Ability to manage health-related needs will improve Outcome: Progressing   Problem: Metabolic: Goal: Ability to maintain appropriate glucose levels will improve Outcome: Progressing   Problem: Nutritional: Goal: Maintenance of adequate nutrition will improve Outcome: Progressing   Problem: Skin Integrity: Goal: Risk for impaired skin integrity will decrease Outcome: Progressing   Problem: Tissue Perfusion: Goal: Adequacy of tissue perfusion will improve Outcome: Progressing   Problem: Elimination: Goal: Will not experience complications related to urinary retention Outcome: Progressing

## 2024-01-27 DIAGNOSIS — Z7189 Other specified counseling: Secondary | ICD-10-CM

## 2024-01-27 DIAGNOSIS — Z515 Encounter for palliative care: Secondary | ICD-10-CM

## 2024-01-27 DIAGNOSIS — I4891 Unspecified atrial fibrillation: Secondary | ICD-10-CM | POA: Diagnosis not present

## 2024-01-27 DIAGNOSIS — E785 Hyperlipidemia, unspecified: Secondary | ICD-10-CM

## 2024-01-27 DIAGNOSIS — G3183 Dementia with Lewy bodies: Secondary | ICD-10-CM

## 2024-01-27 DIAGNOSIS — J111 Influenza due to unidentified influenza virus with other respiratory manifestations: Principal | ICD-10-CM

## 2024-01-27 DIAGNOSIS — J441 Chronic obstructive pulmonary disease with (acute) exacerbation: Secondary | ICD-10-CM

## 2024-01-27 DIAGNOSIS — F02B2 Dementia in other diseases classified elsewhere, moderate, with psychotic disturbance: Secondary | ICD-10-CM

## 2024-01-27 DIAGNOSIS — R931 Abnormal findings on diagnostic imaging of heart and coronary circulation: Secondary | ICD-10-CM | POA: Diagnosis not present

## 2024-01-27 DIAGNOSIS — J9601 Acute respiratory failure with hypoxia: Secondary | ICD-10-CM | POA: Diagnosis not present

## 2024-01-27 DIAGNOSIS — R7401 Elevation of levels of liver transaminase levels: Secondary | ICD-10-CM

## 2024-01-27 LAB — COMPREHENSIVE METABOLIC PANEL
ALT: 174 U/L — ABNORMAL HIGH (ref 0–44)
AST: 66 U/L — ABNORMAL HIGH (ref 15–41)
Albumin: 2.8 g/dL — ABNORMAL LOW (ref 3.5–5.0)
Alkaline Phosphatase: 37 U/L — ABNORMAL LOW (ref 38–126)
Anion gap: 10 (ref 5–15)
BUN: 32 mg/dL — ABNORMAL HIGH (ref 8–23)
CO2: 23 mmol/L (ref 22–32)
Calcium: 8.4 mg/dL — ABNORMAL LOW (ref 8.9–10.3)
Chloride: 104 mmol/L (ref 98–111)
Creatinine, Ser: 1.09 mg/dL — ABNORMAL HIGH (ref 0.44–1.00)
GFR, Estimated: 51 mL/min — ABNORMAL LOW (ref >60)
Glucose, Bld: 126 mg/dL — ABNORMAL HIGH (ref 70–99)
Potassium: 3.9 mmol/L (ref 3.5–5.1)
Sodium: 137 mmol/L (ref 135–145)
Total Bilirubin: 0.5 mg/dL (ref 0.0–1.2)
Total Protein: 5.4 g/dL — ABNORMAL LOW (ref 6.5–8.1)

## 2024-01-27 LAB — CULTURE, BLOOD (ROUTINE X 2)
Culture: NO GROWTH
Culture: NO GROWTH

## 2024-01-27 LAB — GLUCOSE, CAPILLARY
Glucose-Capillary: 179 mg/dL — ABNORMAL HIGH (ref 70–99)
Glucose-Capillary: 74 mg/dL (ref 70–99)
Glucose-Capillary: 110 mg/dL — ABNORMAL HIGH (ref 70–99)
Glucose-Capillary: 171 mg/dL — ABNORMAL HIGH (ref 70–99)
Glucose-Capillary: 175 mg/dL — ABNORMAL HIGH (ref 70–99)

## 2024-01-27 LAB — CBC
HCT: 36.6 % (ref 36.0–46.0)
Hemoglobin: 11.8 g/dL — ABNORMAL LOW (ref 12.0–15.0)
MCH: 31.1 pg (ref 26.0–34.0)
MCHC: 32.2 g/dL (ref 30.0–36.0)
MCV: 96.3 fL (ref 80.0–100.0)
Platelets: 139 10*3/uL — ABNORMAL LOW (ref 150–400)
RBC: 3.8 MIL/uL — ABNORMAL LOW (ref 3.87–5.11)
RDW: 13.9 % (ref 11.5–15.5)
WBC: 10.8 10*3/uL — ABNORMAL HIGH (ref 4.0–10.5)
nRBC: 0.2 % (ref 0.0–0.2)

## 2024-01-27 NOTE — Progress Notes (Addendum)
PROGRESS NOTE    Lisa Larson  ZOX:[id number] DOB: 1941-04-25 DOA: 01/22/2024 PCP: Pincus Sanes, MD    Chief Complaint  Patient presents with   Shortness of Breath    Brief Narrative:  82yo with hx COPD who presented to ED with increased sob with cough x 3 days, initially noted to be hypoxemic needing bipap. Pt was found to be pos for flu. Pt found to be in afib with RVR and started on cardizem gtt. Cardiology following. 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 -Chest x-ray with no acute abnormality. -Continue Tamiflu. -Continue Brovana, Pulmicort, prednisone, Yupelri. -Supportive care.  2.  Paroxysmal A-fib with RVR -Likely secondary to problem #1. -2D echo done with a EF of 25 to 30%, wall motion abnormalities noted, right ventricular systolic function moderately reduced, mildly dilated left atrial size, moderately dilated right atrial size, moderate pericardial effusion noted.  Mild to moderate MVR.  Mild to moderate TVR. -Patient seen in consultation by cardiology was initially on IV amiodarone and transition to oral amiodarone 200 mg twice daily x 1 week, then 200 mg daily. -Also noted to be on metoprolol increased to 25 mg twice daily. -Patient felt not to be anticoagulation candidate due to risk of fall. -Per cardiology patient not a candidate for heart catheterization. -Per cardiology.  3.  Acute renal failure -Improved with hydration.  4.  Toxic  metabolic encephalopathy with dementia -Clinical improvement. -Continue current regimen of Seroquel as recommended.  Psychiatry. -Will likely need outpatient follow-up with neurology.  5.  Adrenal insufficiency on chronic prednisone -On IV steroids transition to oral prednisone and will taper down to patient's baseline of 5 mg daily.  6.  Transaminitis -??  Etiology -Statin on hold. -Improving. -Outpatient follow-up.  7.  Hyperlipidemia -Statin on hold secondary to transaminitis. -Will defer resumption to PCP in the outpatient setting.  8.  Abnormal 2D echo/elevated troponins -2D echo with a EF of 25 to 30%, wall motion abnormalities noted, right ventricular systolic function moderately reduced, mildly dilated left atrial size, moderately dilated right atrial size, moderate pericardial effusion noted.  Mild to moderate MVR.  Mild to moderate TVR. -Patient seen in consultation by cardiology and felt not to be a candidate for cardiac catheterization. -Per cardiology once blood pressure improves may need to consider addition of GDMT which can also be done in the outpatient setting. -Continue current regimen of Toprol.    DVT prophylaxis: Lovenox Code Status: DNR Family Communication: Updated patient.  Updated daughter on the telephone.  Disposition: TBD  Status is: Inpatient Remains inpatient appropriate because: Severity of illness   Consultants:  Cardiology: Dr. Flora Lipps 01/22/2024 Palliative care Psychiatry: Dr. Jannifer Franklin 01/23/2024  Procedures:  CT head 01/25/2024 Chest x-ray 01/22/2024 2D echo 01/23/2024   Antimicrobials:  Anti-infectives (From admission, onward)    Start     Dose/Rate Route Frequency Ordered Stop   01/25/24 1000  oseltamivir (TAMIFLU) 6 MG/ML suspension 30 mg        30 mg Oral  2 times daily 01/25/24 0920 01/28/24 0959   01/22/24 1230  oseltamivir (TAMIFLU) 6 MG/ML suspension 30 mg  Status:  Discontinued        30 mg Oral Daily 01/22/24 1213 01/25/24  0920   01/22/24 1215  cefTRIAXone (ROCEPHIN) 2 g in sodium chloride 0.9 % 100 mL IVPB  Status:  Discontinued        2 g 200 mL/hr over 30 Minutes Intravenous Every 24 hours 01/22/24 1213 01/26/24 1310   01/22/24 1215  azithromycin (ZITHROMAX) 500 mg in sodium chloride 0.9 % 250 mL IVPB        500 mg 250 mL/hr over 60 Minutes Intravenous Every 24 hours 01/22/24 1213 01/26/24 1221         Subjective: Patient sitting in recliner, working with therapy this morning.  Patient denies any chest pain, feels shortness of breath and breathing is improving slowly.  No abdominal pain.  Overall slowly feeling better.  Objective: Vitals:   01/27/24 1500 01/27/24 1558 01/27/24 1600 01/27/24 1700  BP: 104/74  110/61 108/74  Pulse: 87  77 86  Resp: (!) 21  18 19   Temp:  97.9 F (36.6 C)    TempSrc:  Axillary    SpO2: 92%  92% 92%  Weight:      Height:        Intake/Output Summary (Last 24 hours) at 01/27/2024 1716 Last data filed at 01/27/2024 1300 Gross per 24 hour  Intake 680 ml  Output 300 ml  Net 380 ml   Filed Weights   01/22/24 1415 01/23/24 0500 01/25/24 0408  Weight: 63.4 kg 63.6 kg 68.7 kg    Examination:  General exam: Appears calm and comfortable  Respiratory system: Minimal expiratory wheezing.  No crackles.  No rhonchi.  Fair air movement.  On 2 L high flow nasal cannula with sats of 97 and 99%.  Cardiovascular system: Irregularly irregular.  No JVD, no murmurs rubs or gallops.  No lower extremity edema.  Gastrointestinal system: Abdomen is nondistended, soft and nontender. No organomegaly or masses felt. Normal bowel sounds heard. Central nervous system: Alert and oriented. No focal neurological deficits. Extremities: Symmetric 5 x 5 power. Skin: No rashes, lesions or ulcers Psychiatry: Judgement and insight appear normal. Mood & affect appropriate.     Data Reviewed: I have personally reviewed following labs and imaging studies  CBC: Recent Labs  Lab  01/22/24 0955 01/22/24 0956 01/23/24 0456 01/24/24 0213 01/25/24 0304 01/26/24 0534 01/27/24 0232  WBC 9.3   < > 10.3 12.3* 9.1 7.9 10.8*  NEUTROABS 7.3  --   --  11.3*  --   --   --   HGB 13.6   < > 12.2 11.5* 12.3 12.2 11.8*  HCT 43.5   < > 38.9 36.5 38.8 37.9 36.6  MCV 99.3   < > 99.0 96.6 97.5 96.7 96.3  PLT 143*   < > 120* 111* 126* 119* 139*   < > = values in this interval not displayed.    Basic Metabolic Panel: Recent Labs  Lab 01/22/24 0955 01/22/24 0956 01/23/24 0456 01/24/24 0213 01/25/24 0304 01/26/24 0534 01/27/24 0232  NA 143   < > 138 140 138 142 137  K 3.6   < > 4.0 4.2 4.0 4.2 3.9  CL 105  --  104 106 105 108 104  CO2 22  --  24 25 23 24 23   GLUCOSE 161*  --  116* 129* 129* 105* 126*  BUN 22  --  19 21 24* 27* 32*  CREATININE 1.86*   < > 0.97 0.92 0.99 1.03* 1.09*  CALCIUM 9.5  --  8.9 8.6* 8.8* 8.8* 8.4*  MG 1.9  --  2.0  --   --   --   --   PHOS  --   --  2.7  --   --   --   --    < > = values in this interval not displayed.    GFR: Estimated Creatinine Clearance: 38.3 mL/min (A) (by C-G formula based on SCr of 1.09 mg/dL (H)).  Liver Function Tests: Recent Labs  Lab 01/22/24 0955 01/23/24 0456 01/25/24 0304 01/26/24 0534 01/27/24 0232  AST 244* 383* 215* 109* 66*  ALT 165* 281* 284* 212* 174*  ALKPHOS 50 47 39 31* 37*  BILITOT 0.7 0.5 0.3 0.5 0.5  PROT 7.2 5.7* 5.7* 5.1* 5.4*  ALBUMIN 4.0 3.0* 3.0* 2.8* 2.8*    CBG: Recent Labs  Lab 01/26/24 1521 01/26/24 2140 01/27/24 0730 01/27/24 1154 01/27/24 1557  GLUCAP 105* 168* 74 110* 171*     Recent Results (from the past 240 hours)  Blood Culture (routine x 2)     Status: None   Collection Time: 01/22/24  9:44 AM   Specimen: BLOOD  Result Value Ref Range Status   Specimen Description BLOOD BLOOD RIGHT WRIST  Final   Special Requests   Final    BOTTLES DRAWN AEROBIC AND ANAEROBIC Blood Culture results may not be optimal due to an inadequate volume of blood received in culture  bottles   Culture   Final    NO GROWTH 5 DAYS Performed at [facility], [address] [place], [place] [address]    Report Status 01/27/2024 FINAL  Final  Resp panel by RT-PCR (RSV, Flu A&B, Covid) Anterior Nasal Swab     Status: Abnormal   Collection Time: 01/22/24  9:46 AM   Specimen: Anterior Nasal Swab  Result Value Ref Range Status   SARS Coronavirus 2 by RT PCR NEGATIVE NEGATIVE Final   Influenza A by PCR POSITIVE (A) NEGATIVE Final   Influenza B by PCR NEGATIVE NEGATIVE Final    Comment: (NOTE) The Xpert Xpress SARS-CoV-2/FLU/RSV plus assay is intended as an aid in the diagnosis of influenza from Nasopharyngeal swab specimens and should not be used as a sole basis for treatment. Nasal washings and aspirates are unacceptable for Xpert Xpress SARS-CoV-2/FLU/RSV testing.  Fact Sheet for Patients: BloggerCourse.com  Fact Sheet for Healthcare Providers: SeriousBroker.it  This test is not yet approved or cleared by the [place] FDA and has been authorized for detection and/or diagnosis of SARS-CoV-2 by FDA under an Emergency Use Authorization (EUA). This EUA will remain in effect (meaning this test can be used) for the duration of the COVID-19 declaration under Section 564(b)(1) of the Act, 21 U.S.C. section 360bbb-3(b)(1), unless the authorization is terminated or revoked.     Resp Syncytial Virus by PCR NEGATIVE NEGATIVE Final    Comment: (NOTE) Fact Sheet for Patients: BloggerCourse.com  Fact Sheet for Healthcare Providers: SeriousBroker.it  This test is not yet approved or cleared by the [place] FDA and has been authorized for detection and/or diagnosis of SARS-CoV-2 by FDA under an Emergency Use Authorization (EUA). This EUA will remain in effect (meaning this test can be used) for the duration of the COVID-19 declaration under Section 564(b)(1) of  the Act, 21 U.S.C. section 360bbb-3(b)(1), unless the authorization is terminated or revoked.  Performed  at [facility], [address] [place], [place] [address]   Blood Culture (routine x 2)     Status: None   Collection Time: 01/22/24  1:32 PM   Specimen: BLOOD  Result Value Ref Range Status   Specimen Description BLOOD SITE NOT SPECIFIED  Final   Special Requests   Final    BOTTLES DRAWN AEROBIC AND ANAEROBIC Blood Culture results may not be optimal due to an inadequate volume of blood received in culture bottles   Culture   Final    NO GROWTH 5 DAYS Performed at [facility], [address] [place], [place] [address]    Report Status 01/27/2024 FINAL  Final  MRSA Next Gen by PCR, Nasal     Status: None   Collection Time: 01/22/24  6:22 PM   Specimen: Nasal Mucosa; Nasal Swab  Result Value Ref Range Status   MRSA by PCR Next Gen NOT DETECTED NOT DETECTED Final    Comment: (NOTE) The GeneXpert MRSA Assay (FDA approved for NASAL specimens only), is one component of a comprehensive MRSA colonization surveillance program. It is not intended to diagnose MRSA infection nor to guide or monitor treatment for MRSA infections. Test performance is not FDA approved in patients less than 46 years old. Performed at [facility], [address] [place], [place] [address]   Respiratory (~20 pathogens) panel by PCR     Status: Abnormal   Collection Time: 01/23/24 12:17 PM   Specimen: Nasopharyngeal Swab; Respiratory  Result Value Ref Range Status   Adenovirus NOT DETECTED NOT DETECTED Final   Coronavirus 229E NOT DETECTED NOT DETECTED Final    Comment: (NOTE) The Coronavirus on the Respiratory Panel, DOES NOT test for the novel  Coronavirus (2019 nCoV)    Coronavirus HKU1 NOT DETECTED NOT DETECTED Final   Coronavirus NL63 NOT DETECTED NOT DETECTED Final   Coronavirus OC43 NOT DETECTED NOT DETECTED Final   Metapneumovirus NOT DETECTED NOT DETECTED Final    Rhinovirus / Enterovirus NOT DETECTED NOT DETECTED Final   Influenza A H1 2009 DETECTED (A) NOT DETECTED Final   Influenza B NOT DETECTED NOT DETECTED Final   Parainfluenza Virus 1 NOT DETECTED NOT DETECTED Final   Parainfluenza Virus 2 NOT DETECTED NOT DETECTED Final   Parainfluenza Virus 3 NOT DETECTED NOT DETECTED Final   Parainfluenza Virus 4 NOT DETECTED NOT DETECTED Final   Respiratory Syncytial Virus NOT DETECTED NOT DETECTED Final   Bordetella pertussis NOT DETECTED NOT DETECTED Final   Bordetella Parapertussis NOT DETECTED NOT DETECTED Final   Chlamydophila pneumoniae NOT DETECTED NOT DETECTED Final   Mycoplasma pneumoniae NOT DETECTED NOT DETECTED Final    Comment: Performed at [facility], [address], [place], [place] [address]         Radiology Studies: No results found.      Scheduled Meds:  amiodarone  200 mg Oral BID   arformoterol  15 mcg Nebulization BID   budesonide (PULMICORT) nebulizer solution  0.5 mg Nebulization BID   Chlorhexidine Gluconate Cloth  6 each Topical Daily   enoxaparin (LOVENOX) injection  40 mg Subcutaneous Q24H   feeding supplement  237 mL Oral BID BM   gabapentin  300 mg Oral TID   insulin aspart  0-9 Units Subcutaneous TID WC   metoprolol tartrate  25 mg Oral BID   oseltamivir  30 mg Oral BID   predniSONE  40 mg Oral Q breakfast   QUEtiapine  25 mg Oral Q lunch   QUEtiapine  50 mg Oral QHS   revefenacin  175 mcg Nebulization Daily   Continuous Infusions:   LOS: 5 days    Time spent: 40 minutes    Ramiro Harvest, MD [facility]   To contact the attending provider between 7A-7P or the covering provider during after hours 7P-7A, please log into the web site [email or web address] and access using universal [place] password for that web site. If you do not have the password, please call the hospital operator.  01/27/2024, 5:16 PM

## 2024-01-27 NOTE — Progress Notes (Signed)
Spoke with patient's daughter, Elmarie Shiley, and a family meeting is planned with Palliative care tomorrow 1/30 at 1pm. Patient's daughter would like if patient's Seroquel is held so patient is not too drowsy to participate in palliative care meeting.

## 2024-01-27 NOTE — Plan of Care (Signed)
  Problem: Coping: Goal: Ability to adjust to condition or change in health will improve Outcome: Progressing   Problem: Fluid Volume: Goal: Ability to maintain a balanced intake and output will improve Outcome: Progressing   Problem: Metabolic: Goal: Ability to maintain appropriate glucose levels will improve Outcome: Progressing   Problem: Nutritional: Goal: Maintenance of adequate nutrition will improve Outcome: Progressing   Problem: Skin Integrity: Goal: Risk for impaired skin integrity will decrease Outcome: Progressing   Problem: Tissue Perfusion: Goal: Adequacy of tissue perfusion will improve Outcome: Progressing   Problem: Clinical Measurements: Goal: Will remain free from infection Outcome: Progressing   Problem: Activity: Goal: Risk for activity intolerance will decrease Outcome: Progressing   Problem: Coping: Goal: Level of anxiety will decrease Outcome: Progressing   Problem: Pain Managment: Goal: General experience of comfort will improve and/or be controlled Outcome: Progressing

## 2024-01-27 NOTE — Progress Notes (Signed)
Physical Therapy Treatment Patient Details Name: GLORIANA PILTZ MRN: [id number] DOB: Sep 24, 1941 Today's Date: 01/27/2024   History of Present Illness 32 female admitted on 01/22/2024 with septic shock secondary to influenza pneumonia. PMHx: dementia, paroxysmal atrial fibrillation (not on anticoagulation due to fall risk, shared decision making between primary cardiologist and patient and family), COPD, frequent hallucinations related to dementia, prior tobacco abuse.    PT Comments  Patient resting in recliner and requesting return to bed, pt agreeable to participate in therapy session prior to return to bed. Min assist required to shift trunk anteriorly in preparation to stand, Mod assist provided for  power up and to stabilize balance with RW. Pt initiated gait with min assist to advance and steady walker with chair follow for safety. Cues needed throughout for posture and assist to maintain proximity to RW. Patient returned to EOB and Mod assist required to bring bil LE's onto bed. IN supine pt completed LE' strengthening and was able to complete 5x bridges to reposition hips/center self in bed and boost self superiorly. EOS bed placed in chair position, and call bell and need within reach. Will continue to progress pt as able during stay.    If plan is discharge home, recommend the following: A lot of help with bathing/dressing/bathroom;Assistance with cooking/housework;Direct supervision/assist for medications management;Assist for transportation;A lot of help with walking and/or transfers;Assistance with feeding;Direct supervision/assist for financial management;Supervision due to cognitive status   Can travel by private vehicle     Yes  Equipment Recommendations  None recommended by PT    Recommendations for Other Services       Precautions / Restrictions Precautions Precautions: Fall Precaution Comments: droplet Restrictions Weight Bearing Restrictions Per Provider Order: No      Mobility  Bed Mobility Overal bed mobility: Needs Assistance Bed Mobility: Sit to Supine       Sit to supine: Mod assist   General bed mobility comments: assist for LE back into bed    Transfers Overall transfer level: Needs assistance Equipment used: Rolling walker (2 wheels) Transfers: Bed to chair/wheelchair/BSC, Sit to/from Stand Sit to Stand: Mod assist   Step pivot transfers: Mod assist       General transfer comment: assist for rising and to steady as she took pivotal steps from recliner to bed, pt distracted by head/neck pain    Ambulation/Gait Ambulation/Gait assistance: Min assist Gait Distance (Feet): 10 Feet Assistive device: Rolling walker (2 wheels) Gait Pattern/deviations: Step-through pattern, Trunk flexed, Decreased stride length Gait velocity: decr         Stairs             Wheelchair Mobility     Tilt Bed    Modified Rankin (Stroke Patients Only)       Balance Overall balance assessment: Needs assistance Sitting-balance support: Bilateral upper extremity supported, Feet supported, Single extremity supported Sitting balance-Leahy Scale: Fair     Standing balance support: Bilateral upper extremity supported, During functional activity, Reliant on assistive device for balance Standing balance-Leahy Scale: Poor                              Cognition Arousal: Alert Behavior During Therapy: WFL for tasks assessed/performed Overall Cognitive Status: Impaired/Different from baseline Area of Impairment: Following commands, Problem solving, Memory, Orientation                 Orientation Level: Disoriented to, Time, Situation   Memory: Decreased short-term  memory Following Commands: Follows one step commands with increased time     Problem Solving: Slow processing, Decreased initiation, Difficulty sequencing, Requires verbal cues          Exercises General Exercises - Lower Extremity Ankle  Circles/Pumps: AROM, Both, 10 reps Straight Leg Raises: AROM, Both, 10 reps    General Comments        Pertinent Vitals/Pain Pain Assessment Pain Assessment: Faces Faces Pain Scale: Hurts little more Pain Location: neck and HA Pain Descriptors / Indicators: Aching, Grimacing Pain Intervention(s): Limited activity within patient's tolerance, Monitored during session, Repositioned    Home Living                          Prior Function            PT Goals (current goals can now be found in the care plan section) Acute Rehab PT Goals Patient Stated Goal: Feel better PT Goal Formulation: With family Time For Goal Achievement: 02/08/24 Potential to Achieve Goals: Fair Progress towards PT goals: Progressing toward goals    Frequency    Min 1X/week      PT Plan      Co-evaluation              AM-PAC PT "6 Clicks" Mobility   Outcome Measure  Help needed turning from your back to your side while in a flat bed without using bedrails?: A Lot Help needed moving from lying on your back to sitting on the side of a flat bed without using bedrails?: A Lot Help needed moving to and from a bed to a chair (including a wheelchair)?: A Lot Help needed standing up from a chair using your arms (e.g., wheelchair or bedside chair)?: A Lot Help needed to walk in hospital room?: A Lot Help needed climbing 3-5 steps with a railing? : Total 6 Click Score: 11    End of Session Equipment Utilized During Treatment: Gait belt Activity Tolerance: Patient tolerated treatment well Patient left: in bed;with call bell/phone within reach Nurse Communication: Mobility status PT Visit Diagnosis: Unsteadiness on feet (R26.81);Difficulty in walking, not elsewhere classified (R26.2);Pain;History of falling (Z91.81);Muscle weakness (generalized) (M62.81);Other symptoms and signs involving the nervous system (R29.898) Pain - Right/Left: Right Pain - part of body:  (neck)     Time:  1116-1140 PT Time Calculation (min) (ACUTE ONLY): 24 min  Charges:    $Gait Training: 8-22 mins $Therapeutic Activity: 8-22 mins PT General Charges $$ ACUTE PT VISIT: 1 Visit                     Wynn Maudlin, DPT Acute Rehabilitation Services Office [phone]  01/27/24 12:39 PM

## 2024-01-27 NOTE — TOC Progression Note (Signed)
Transition of Care [facility]) - Progression Note    Patient Details  Name: Lisa Larson MRN: [id number] Date of Birth: [birth year]  Transition of [facility] CM/SW Contact  Demetrica Zipp [place], [place] Phone Number: 01/27/2024, 3:33 PM  Clinical Narrative:     CSW spoke with Duwayne Heck at [facility] who is assisting with family in finding LTC at ALF level of care following completion of rehab at [facility]. Plan for now is to pursue short term rehab. CSW updated Danielle on current bed offer status.   CSW called pt's daughter and confirmed current plan to pursue STR at SNF. CSW updated her on bed offers. CSW also explained [facility] is "considering" and would require further follow up. Daughter is willing to consider [facility] despite being a little outside of [place]. CSW to follow up with [facility] regarding potential bed offer.   CSW discussed referral with [facility]. They have concerns about being able to manage pt with her hallucinations and documented agitation in the evenings. They are unable to offer.   Expected Discharge Plan: Skilled Nursing Facility Barriers to Discharge: Continued Medical Work up, SNF Pending bed offer  Expected Discharge Plan and Services       Living arrangements for the past 2 months: Skilled Nursing Facility                                       Social Determinants of Health (SDOH) Interventions SDOH Screenings   Food Insecurity: Patient Unable To Answer (01/25/2024)  Housing: Patient Unable To Answer (01/25/2024)  Transportation Needs: Patient Unable To Answer (01/25/2024)  Utilities: Patient Unable To Answer (01/25/2024)  Alcohol Screen: Low Risk  (08/01/2022)  Depression (PHQ2-9): Medium Risk (08/03/2023)  Financial Resource Strain: Low Risk  (08/03/2023)  Physical Activity: Inactive (08/03/2023)  Social Connections: Unknown (01/25/2024)  Stress: No Stress Concern Present (08/03/2023)  Recent Concern: Stress - Stress Concern Present (05/08/2023)  Tobacco Use:  Medium Risk (01/22/2024)  Health Literacy: Adequate Health Literacy (08/03/2023)    Readmission Risk Interventions     No data to display
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
## 2024-01-28 DIAGNOSIS — Z515 Encounter for palliative care: Secondary | ICD-10-CM | POA: Diagnosis not present

## 2024-01-28 DIAGNOSIS — J9601 Acute respiratory failure with hypoxia: Secondary | ICD-10-CM | POA: Diagnosis not present

## 2024-01-28 DIAGNOSIS — R931 Abnormal findings on diagnostic imaging of heart and coronary circulation: Secondary | ICD-10-CM | POA: Diagnosis not present

## 2024-01-28 DIAGNOSIS — G3183 Dementia with Lewy bodies: Secondary | ICD-10-CM | POA: Diagnosis not present

## 2024-01-28 DIAGNOSIS — J111 Influenza due to unidentified influenza virus with other respiratory manifestations: Secondary | ICD-10-CM | POA: Diagnosis not present

## 2024-01-28 DIAGNOSIS — Z7189 Other specified counseling: Secondary | ICD-10-CM | POA: Diagnosis not present

## 2024-01-28 DIAGNOSIS — I5020 Unspecified systolic (congestive) heart failure: Secondary | ICD-10-CM

## 2024-01-28 DIAGNOSIS — I4891 Unspecified atrial fibrillation: Secondary | ICD-10-CM | POA: Diagnosis not present

## 2024-01-28 LAB — BASIC METABOLIC PANEL
Anion gap: 8 (ref 5–15)
BUN: 28 mg/dL — ABNORMAL HIGH (ref 8–23)
CO2: 26 mmol/L (ref 22–32)
Calcium: 8.5 mg/dL — ABNORMAL LOW (ref 8.9–10.3)
Chloride: 107 mmol/L (ref 98–111)
Creatinine, Ser: 1.11 mg/dL — ABNORMAL HIGH (ref 0.44–1.00)
GFR, Estimated: 50 mL/min — ABNORMAL LOW (ref >60)
Glucose, Bld: 90 mg/dL (ref 70–99)
Potassium: 3.7 mmol/L (ref 3.5–5.1)
Sodium: 141 mmol/L (ref 135–145)

## 2024-01-28 LAB — CBC
HCT: 37.8 % (ref 36.0–46.0)
Hemoglobin: 12.3 g/dL (ref 12.0–15.0)
MCH: 30.8 pg (ref 26.0–34.0)
MCHC: 32.5 g/dL (ref 30.0–36.0)
MCV: 94.5 fL (ref 80.0–100.0)
Platelets: 137 10*3/uL — ABNORMAL LOW (ref 150–400)
RBC: 4 MIL/uL (ref 3.87–5.11)
RDW: 14 % (ref 11.5–15.5)
WBC: 10.2 10*3/uL (ref 4.0–10.5)
nRBC: 0 % (ref 0.0–0.2)

## 2024-01-28 LAB — GLUCOSE, CAPILLARY
Glucose-Capillary: 79 mg/dL (ref 70–99)
Glucose-Capillary: 115 mg/dL — ABNORMAL HIGH (ref 70–99)
Glucose-Capillary: 117 mg/dL — ABNORMAL HIGH (ref 70–99)
Glucose-Capillary: 104 mg/dL — ABNORMAL HIGH (ref 70–99)

## 2024-01-28 NOTE — Plan of Care (Signed)
  Problem: Fluid Volume: Goal: Ability to maintain a balanced intake and output will improve Outcome: Progressing   Problem: Metabolic: Goal: Ability to maintain appropriate glucose levels will improve Outcome: Progressing   Problem: Health Behavior/Discharge Planning: Goal: Ability to identify and utilize available resources and services will improve Outcome: Not Progressing   Problem: Health Behavior/Discharge Planning: Goal: Ability to manage health-related needs will improve Outcome: Not Progressing   Problem: Education: Goal: Knowledge of General Education information will improve Description: Including pain rating scale, medication(s)/side effects and non-pharmacologic comfort measures Outcome: Not Progressing   Problem: Health Behavior/Discharge Planning: Goal: Ability to manage health-related needs will improve Outcome: Not Progressing   Problem: Coping: Goal: Level of anxiety will decrease Outcome: Not Progressing

## 2024-01-28 NOTE — Progress Notes (Signed)
Palliative:  83 y.o. female  with past medical history of Lewy body? dementia, COPD, HTN, anemia, chronic headaches, degenerative joint disease, gout, previous smoker, anxiety, depression admitted on 01/22/2024 with shortness of breath +flu A, pneumonia, Afib RVR, AKI requiring BiPAP as well as cardizem and heparin infusions.   I met today at Lisa Larson's bedside along with her daughter Lisa Larson, son Lisa Larson, and granddaughter. We spent time reviewing notes, labs, and progress. I answered questions that family have. They have been given some resources to assist with pursuing long term placement. They have been through this process with their father previously. Family express concern for discharge timing which I share is likely soon but no specific date. They had a very bad experience with weekend discharge and Lisa Larson went without medications. I Lisa Larson pass concerns on to attending and CSW.   We further discussed palliative care and goals of care. We discussed the importance of these conversations. Lisa Larson does not recall our conversation yesterday. We discussed her health status now and concerns that these health conditions can worsen over time and cause more issues for her. I explained that it is good for family to know her wishes while she is able to express them. We discussed the importance of discussing the "what ifs" before we are in that position. Lisa Larson shares with her mother that there was concern that she may need intubation and they had to come a conclusion and that she was able to express to them she would not want intubation at that time. We spent much time talking through options and what resuscitation means. Lisa Larson struggles to think through what she wants and then struggles to express her wishes. Ultimately it was determined that she agrees with DNR/DNI. This decision was so difficult we did not discuss any further. I did leave MOST form with family. Family are hoping to discuss  further with their mother. They Lisa Larson try and discuss further in the morning. They know that I Lisa Larson not be here tomorrow. They have contact information for palliative team for follow up as needed.   I messaged Lisa Larson on behalf of family as they have concerns about cardiac medications and they have a good and trusting relationship with Lisa Larson - Lisa Larson Lisa Larson try and touch base with daughter. I sent Lisa Larson a message as family have concerns that they have never heard of a diagnosis of COPD - I am unclear about this with chart review and would be best determined by her pulmonologist. She did smoke for many years but quit long ago.   All questions/concerns addressed. Emotional support provided.   Exam: Awake, alert. Struggling to process information and express herself. No distress. Breathing regular, unlabored on room air. Abd soft. Moves all extremities.   Plan: - DNR/DNI confirmed with patient - Family to further review MOST form - Plans for SNF rehab - they are hopeful for outpatient palliative to follow   55 min  Lisa Channel, NP Palliative Medicine Team Pager [phone] (Please see [email or web address] for schedule) Team Phone [phone]

## 2024-01-28 NOTE — TOC Progression Note (Signed)
Transition of Care [facility]) - Progression Note    Patient Details  Name: Lisa Larson MRN: [id number] Date of Birth: June 24, 1941  Transition of Care [facility]) CM/SW Contact  Dellie Burns [place], [place] Phone Number: 01/28/2024, 3:25 PM  Clinical Narrative: Provided current offers to pt's dtr and made her aware [facility] is not able to offer. Dtr is considering [facility] vs [facility] and requests SW f/u in the AM re choice. Will provide updates as available.   Dellie Burns, MSW, LCSW [phone] (coverage)        Expected Discharge Plan: Skilled Nursing Facility Barriers to Discharge: Continued Medical Work up, SNF Pending bed offer  Expected Discharge Plan and Services       Living arrangements for the past 2 months: Skilled Nursing Facility                                       Social Determinants of Health (SDOH) Interventions SDOH Screenings   Food Insecurity: Patient Unable To Answer (01/25/2024)  Housing: Patient Unable To Answer (01/25/2024)  Transportation Needs: Patient Unable To Answer (01/25/2024)  Utilities: Patient Unable To Answer (01/25/2024)  Alcohol Screen: Low Risk  (08/01/2022)  Depression (PHQ2-9): Medium Risk (08/03/2023)  Financial Resource Strain: Low Risk  (08/03/2023)  Physical Activity: Inactive (08/03/2023)  Social Connections: Unknown (01/25/2024)  Stress: No Stress Concern Present (08/03/2023)  Recent Concern: Stress - Stress Concern Present (05/08/2023)  Tobacco Use: Medium Risk (01/22/2024)  Health Literacy: Adequate Health Literacy (08/03/2023)    Readmission Risk Interventions     No data to display

## 2024-01-28 NOTE — Progress Notes (Signed)
PROGRESS NOTE    Lisa Larson  ZOX:[id number] DOB: June 01, 1941 DOA: 01/22/2024 PCP: Pincus Sanes, MD    Chief Complaint  Patient presents with   Shortness of Breath    Brief Narrative:  82yo with hx COPD who presented to ED with increased sob with cough x 3 days, initially noted to be hypoxemic needing bipap. Pt was found to be pos for flu. Pt found to be in afib with RVR and started on cardizem gtt. Cardiology following. 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 -Chest x-ray with no acute abnormality. -Hypoxia improved with sats of 94% on room air. -Status post completion of 5 days of Tamiflu. -Continue Brovana, Pulmicort, prednisone taper, Mikael Spray. -Supportive care.  2.  Paroxysmal A-fib with RVR -Likely secondary to problem #1. -2D echo done with a EF of 25 to 30%, wall motion abnormalities noted, right ventricular systolic function moderately reduced, mildly dilated left atrial size, moderately dilated right atrial size, moderate pericardial effusion noted.  Mild to moderate MVR.  Mild to moderate TVR. -Patient seen in consultation by cardiology was initially on IV amiodarone and transition to oral amiodarone 200 mg twice daily x 1 week, then 200 mg daily. -Also noted to be on metoprolol increased to 25 mg twice daily. -Patient felt not to be anticoagulation candidate due to risk of fall. -Per cardiology patient not a candidate for heart catheterization. -Per  cardiology.  3.  Acute renal failure -Improved with hydration.   4.  Toxic metabolic encephalopathy with dementia -Clinical improvement. -Continue current regimen of Seroquel as recommended.  Psychiatry. -Will likely need outpatient follow-up with neurology.  5.  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   6.  Transaminitis -??  Etiology -Statin on hold. -Improving. -Outpatient follow-up.  7.  Hyperlipidemia -Continue to hold statin secondary to transaminitis.   -Resumption of statin will be due for to PCP in the outpatient setting.   8.  Abnormal 2D echo/elevated troponins -2D echo with a EF of 25 to 30%, wall motion abnormalities noted, right ventricular systolic function moderately reduced, mildly dilated left atrial size, moderately dilated right atrial size, moderate pericardial effusion noted.  Mild to moderate MVR.  Mild to moderate TVR. -Patient seen in consultation by cardiology and felt not to be a candidate for cardiac catheterization. -Per cardiology once blood pressure improves may need to consider addition of GDMT which can also be done in the outpatient setting. -Continue current regimen of Toprol.    DVT prophylaxis: Lovenox Code Status: DNR Family Communication: Updated patient.  No family at bedside.  Disposition: TBD  Status is: Inpatient Remains inpatient appropriate because: Severity of illness   Consultants:  Cardiology: Dr. Flora Lipps 01/22/2024 Palliative care Psychiatry: Dr. Jannifer Franklin 01/23/2024  Procedures:  CT head 01/25/2024 Chest x-ray 01/22/2024 2D echo 01/23/2024   Antimicrobials:  Anti-infectives (From admission, onward)    Start  Dose/Rate Route Frequency Ordered Stop   01/25/24 1000  oseltamivir (TAMIFLU) 6 MG/ML suspension 30 mg        30 mg Oral 2 times daily 01/25/24 0920 01/27/24 2304   01/22/24 1230   oseltamivir (TAMIFLU) 6 MG/ML suspension 30 mg  Status:  Discontinued        30 mg Oral Daily 01/22/24 1213 01/25/24 0920   01/22/24 1215  cefTRIAXone (ROCEPHIN) 2 g in sodium chloride 0.9 % 100 mL IVPB  Status:  Discontinued        2 g 200 mL/hr over 30 Minutes Intravenous Every 24 hours 01/22/24 1213 01/26/24 1310   01/22/24 1215  azithromycin (ZITHROMAX) 500 mg in sodium chloride 0.9 % 250 mL IVPB        500 mg 250 mL/hr over 60 Minutes Intravenous Every 24 hours 01/22/24 1213 01/26/24 1221         Subjective: Patient lying in bed sleeping, watching/listening to television.  Easily arousable.  Denies any chest pain or shortness of breath.  No abdominal pain.  Some complaints of neck pain.  Overall feeling better today than she did yesterday per patient.    Objective: Vitals:   01/28/24 0430 01/28/24 0612 01/28/24 0730 01/28/24 0754  BP:      Pulse:      Resp:      Temp: (!) 97.4 F (36.3 C)   97.9 F (36.6 C)  TempSrc: Axillary   Axillary  SpO2:   94%   Weight:  65.6 kg    Height:        Intake/Output Summary (Last 24 hours) at 01/28/2024 1034 Last data filed at 01/28/2024 0600 Gross per 24 hour  Intake 440 ml  Output 700 ml  Net -260 ml   Filed Weights   01/23/24 0500 01/25/24 0408 01/28/24 0612  Weight: 63.6 kg 68.7 kg 65.6 kg    Examination:  General exam: NAD.  Sleeping. Respiratory system: Lungs are CTAB anterior lung fields.  No wheezes, no crackles, no rhonchi.  Fair air movement.   Cardiovascular system: Irregularly irregular.  No JVD, no murmurs rubs or gallops.  No lower extremity edema.   Gastrointestinal system: Abdomen is soft, nontender, nondistended, positive bowel sounds.  No rebound.  No guarding. Central nervous system: Alert and oriented. No focal neurological deficits. Extremities: Symmetric 5 x 5 power. Skin: No rashes, lesions or ulcers Psychiatry: Judgement and insight appear normal. Mood & affect appropriate.     Data Reviewed: I have  personally reviewed following labs and imaging studies  CBC: Recent Labs  Lab 01/22/24 0955 01/22/24 0956 01/24/24 0213 01/25/24 0304 01/26/24 0534 01/27/24 0232 01/28/24 0310  WBC 9.3   < > 12.3* 9.1 7.9 10.8* 10.2  NEUTROABS 7.3  --  11.3*  --   --   --   --   HGB 13.6   < > 11.5* 12.3 12.2 11.8* 12.3  HCT 43.5   < > 36.5 38.8 37.9 36.6 37.8  MCV 99.3   < > 96.6 97.5 96.7 96.3 94.5  PLT 143*   < > 111* 126* 119* 139* 137*   < > = values in this interval not displayed.    Basic Metabolic Panel: Recent Labs  Lab 01/22/24 0955 01/22/24 0956 01/23/24 0456 01/24/24 0213 01/25/24 0304 01/26/24 0534 01/27/24 0232 01/28/24 0310  NA 143   < > 138 140 138 142 137 141  K 3.6   < > 4.0 4.2 4.0 4.2 3.9 3.7  CL  105  --  104 106 105 108 104 107  CO2 22  --  24 25 23 24 23 26   GLUCOSE 161*  --  116* 129* 129* 105* 126* 90  BUN 22  --  19 21 24* 27* 32* 28*  CREATININE 1.86*   < > 0.97 0.92 0.99 1.03* 1.09* 1.11*  CALCIUM 9.5  --  8.9 8.6* 8.8* 8.8* 8.4* 8.5*  MG 1.9  --  2.0  --   --   --   --   --   PHOS  --   --  2.7  --   --   --   --   --    < > = values in this interval not displayed.    GFR: Estimated Creatinine Clearance: 36.8 mL/min (A) (by C-G formula based on SCr of 1.11 mg/dL (H)).  Liver Function Tests: Recent Labs  Lab 01/22/24 0955 01/23/24 0456 01/25/24 0304 01/26/24 0534 01/27/24 0232  AST 244* 383* 215* 109* 66*  ALT 165* 281* 284* 212* 174*  ALKPHOS 50 47 39 31* 37*  BILITOT 0.7 0.5 0.3 0.5 0.5  PROT 7.2 5.7* 5.7* 5.1* 5.4*  ALBUMIN 4.0 3.0* 3.0* 2.8* 2.8*    CBG: Recent Labs  Lab 01/27/24 0730 01/27/24 1154 01/27/24 1557 01/27/24 2111 01/28/24 0753  GLUCAP 74 110* 171* 175* 79     Recent Results (from the past 240 hours)  Blood Culture (routine x 2)     Status: None   Collection Time: 01/22/24  9:44 AM   Specimen: BLOOD  Result Value Ref Range Status   Specimen Description BLOOD BLOOD RIGHT WRIST  Final   Special Requests   Final     BOTTLES DRAWN AEROBIC AND ANAEROBIC Blood Culture results may not be optimal due to an inadequate volume of blood received in culture bottles   Culture   Final    NO GROWTH 5 DAYS Performed at [facility], [address] [place], [place] [address]    Report Status 01/27/2024 FINAL  Final  Resp panel by RT-PCR (RSV, Flu A&B, Covid) Anterior Nasal Swab     Status: Abnormal   Collection Time: 01/22/24  9:46 AM   Specimen: Anterior Nasal Swab  Result Value Ref Range Status   SARS Coronavirus 2 by RT PCR NEGATIVE NEGATIVE Final   Influenza A by PCR POSITIVE (A) NEGATIVE Final   Influenza B by PCR NEGATIVE NEGATIVE Final    Comment: (NOTE) The Xpert Xpress SARS-CoV-2/FLU/RSV plus assay is intended as an aid in the diagnosis of influenza from Nasopharyngeal swab specimens and should not be used as a sole basis for treatment. Nasal washings and aspirates are unacceptable for Xpert Xpress SARS-CoV-2/FLU/RSV testing.  Fact Sheet for Patients: BloggerCourse.com  Fact Sheet for Healthcare Providers: SeriousBroker.it  This test is not yet approved or cleared by the [place] FDA and has been authorized for detection and/or diagnosis of SARS-CoV-2 by FDA under an Emergency Use Authorization (EUA). This EUA will remain in effect (meaning this test can be used) for the duration of the COVID-19 declaration under Section 564(b)(1) of the Act, 21 U.S.C. section 360bbb-3(b)(1), unless the authorization is terminated or revoked.     Resp Syncytial Virus by PCR NEGATIVE NEGATIVE Final    Comment: (NOTE) Fact Sheet for Patients: BloggerCourse.com  Fact Sheet for Healthcare Providers: SeriousBroker.it  This test is not yet approved or cleared by the [place] FDA and has been authorized for detection and/or diagnosis  of SARS-CoV-2 by FDA under an Emergency Use Authorization  (EUA). This EUA will remain in effect (meaning this test can be used) for the duration of the COVID-19 declaration under Section 564(b)(1) of the Act, 21 U.S.C. section 360bbb-3(b)(1), unless the authorization is terminated or revoked.  Performed at [facility], [address], [place], [place] [address]   Blood Culture (routine x 2)     Status: None   Collection Time: 01/22/24  1:32 PM   Specimen: BLOOD  Result Value Ref Range Status   Specimen Description BLOOD SITE NOT SPECIFIED  Final   Special Requests   Final    BOTTLES DRAWN AEROBIC AND ANAEROBIC Blood Culture results may not be optimal due to an inadequate volume of blood received in culture bottles   Culture   Final    NO GROWTH 5 DAYS Performed at [facility], [address] [place], [place] [address]    Report Status 01/27/2024 FINAL  Final  MRSA Next Gen by PCR, Nasal     Status: None   Collection Time: 01/22/24  6:22 PM   Specimen: Nasal Mucosa; Nasal Swab  Result Value Ref Range Status   MRSA by PCR Next Gen NOT DETECTED NOT DETECTED Final    Comment: (NOTE) The GeneXpert MRSA Assay (FDA approved for NASAL specimens only), is one component of a comprehensive MRSA colonization surveillance program. It is not intended to diagnose MRSA infection nor to guide or monitor treatment for MRSA infections. Test performance is not FDA approved in patients less than 70 years old. Performed at [facility], [address], [place], [place] [address]   Respiratory (~20 pathogens) panel by PCR     Status: Abnormal   Collection Time: 01/23/24 12:17 PM   Specimen: Nasopharyngeal Swab; Respiratory  Result Value Ref Range Status   Adenovirus NOT DETECTED NOT DETECTED Final   Coronavirus 229E NOT DETECTED NOT DETECTED Final    Comment: (NOTE) The Coronavirus on the Respiratory Panel, DOES NOT test for the novel  Coronavirus (2019 nCoV)    Coronavirus HKU1 NOT DETECTED NOT DETECTED Final   Coronavirus NL63  NOT DETECTED NOT DETECTED Final   Coronavirus OC43 NOT DETECTED NOT DETECTED Final   Metapneumovirus NOT DETECTED NOT DETECTED Final   Rhinovirus / Enterovirus NOT DETECTED NOT DETECTED Final   Influenza A H1 2009 DETECTED (A) NOT DETECTED Final   Influenza B NOT DETECTED NOT DETECTED Final   Parainfluenza Virus 1 NOT DETECTED NOT DETECTED Final   Parainfluenza Virus 2 NOT DETECTED NOT DETECTED Final   Parainfluenza Virus 3 NOT DETECTED NOT DETECTED Final   Parainfluenza Virus 4 NOT DETECTED NOT DETECTED Final   Respiratory Syncytial Virus NOT DETECTED NOT DETECTED Final   Bordetella pertussis NOT DETECTED NOT DETECTED Final   Bordetella Parapertussis NOT DETECTED NOT DETECTED Final   Chlamydophila pneumoniae NOT DETECTED NOT DETECTED Final   Mycoplasma pneumoniae NOT DETECTED NOT DETECTED Final    Comment: Performed at [facility], [address], [place], [place] [address]         Radiology Studies: No results found.      Scheduled Meds:  amiodarone  200 mg Oral BID   arformoterol  15 mcg Nebulization BID   budesonide (PULMICORT) nebulizer solution  0.5 mg Nebulization BID   Chlorhexidine Gluconate Cloth  6 each Topical Daily   enoxaparin (LOVENOX) injection  40 mg Subcutaneous Q24H   feeding supplement  237 mL Oral BID  BM   gabapentin  300 mg Oral TID   insulin aspart  0-9 Units Subcutaneous TID WC   metoprolol tartrate  25 mg Oral BID   predniSONE  40 mg Oral Q breakfast   QUEtiapine  25 mg Oral Q lunch   QUEtiapine  50 mg Oral QHS   revefenacin  175 mcg Nebulization Daily   Continuous Infusions:   LOS: 6 days    Time spent: 35 minutes    Ramiro Harvest, MD [facility]   To contact the attending provider between 7A-7P or the covering provider during after hours 7P-7A, please log into the web site [email or web address] and access using universal [place] password for that web site. If you do not have the password, please call the hospital  operator.  01/28/2024, 10:34 AM

## 2024-01-29 ENCOUNTER — Telehealth: Payer: Self-pay | Admitting: Internal Medicine

## 2024-01-29 DIAGNOSIS — J111 Influenza due to unidentified influenza virus with other respiratory manifestations: Secondary | ICD-10-CM | POA: Diagnosis not present

## 2024-01-29 DIAGNOSIS — F32A Depression, unspecified: Secondary | ICD-10-CM | POA: Diagnosis not present

## 2024-01-29 DIAGNOSIS — R4182 Altered mental status, unspecified: Secondary | ICD-10-CM | POA: Diagnosis not present

## 2024-01-29 DIAGNOSIS — F0280 Dementia in other diseases classified elsewhere without behavioral disturbance: Secondary | ICD-10-CM | POA: Diagnosis not present

## 2024-01-29 DIAGNOSIS — G3183 Dementia with Lewy bodies: Secondary | ICD-10-CM | POA: Diagnosis not present

## 2024-01-29 DIAGNOSIS — R41841 Cognitive communication deficit: Secondary | ICD-10-CM | POA: Diagnosis not present

## 2024-01-29 DIAGNOSIS — E2749 Other adrenocortical insufficiency: Secondary | ICD-10-CM | POA: Diagnosis not present

## 2024-01-29 DIAGNOSIS — N182 Chronic kidney disease, stage 2 (mild): Secondary | ICD-10-CM | POA: Diagnosis not present

## 2024-01-29 DIAGNOSIS — J8281 Chronic eosinophilic pneumonia: Secondary | ICD-10-CM | POA: Diagnosis not present

## 2024-01-29 DIAGNOSIS — I480 Paroxysmal atrial fibrillation: Secondary | ICD-10-CM | POA: Diagnosis not present

## 2024-01-29 DIAGNOSIS — J4550 Severe persistent asthma, uncomplicated: Secondary | ICD-10-CM | POA: Diagnosis not present

## 2024-01-29 DIAGNOSIS — Z7401 Bed confinement status: Secondary | ICD-10-CM | POA: Diagnosis not present

## 2024-01-29 DIAGNOSIS — M810 Age-related osteoporosis without current pathological fracture: Secondary | ICD-10-CM | POA: Diagnosis not present

## 2024-01-29 DIAGNOSIS — R062 Wheezing: Secondary | ICD-10-CM | POA: Diagnosis not present

## 2024-01-29 DIAGNOSIS — G3184 Mild cognitive impairment, so stated: Secondary | ICD-10-CM | POA: Diagnosis not present

## 2024-01-29 DIAGNOSIS — F02B2 Dementia in other diseases classified elsewhere, moderate, with psychotic disturbance: Secondary | ICD-10-CM | POA: Diagnosis not present

## 2024-01-29 DIAGNOSIS — J9601 Acute respiratory failure with hypoxia: Secondary | ICD-10-CM | POA: Diagnosis not present

## 2024-01-29 DIAGNOSIS — B372 Candidiasis of skin and nail: Secondary | ICD-10-CM | POA: Diagnosis not present

## 2024-01-29 DIAGNOSIS — D649 Anemia, unspecified: Secondary | ICD-10-CM | POA: Diagnosis not present

## 2024-01-29 DIAGNOSIS — J90 Pleural effusion, not elsewhere classified: Secondary | ICD-10-CM | POA: Diagnosis not present

## 2024-01-29 DIAGNOSIS — R262 Difficulty in walking, not elsewhere classified: Secondary | ICD-10-CM | POA: Diagnosis not present

## 2024-01-29 DIAGNOSIS — F331 Major depressive disorder, recurrent, moderate: Secondary | ICD-10-CM | POA: Diagnosis not present

## 2024-01-29 DIAGNOSIS — M6281 Muscle weakness (generalized): Secondary | ICD-10-CM | POA: Diagnosis not present

## 2024-01-29 DIAGNOSIS — G4700 Insomnia, unspecified: Secondary | ICD-10-CM | POA: Diagnosis not present

## 2024-01-29 DIAGNOSIS — G319 Degenerative disease of nervous system, unspecified: Secondary | ICD-10-CM | POA: Diagnosis not present

## 2024-01-29 DIAGNOSIS — R059 Cough, unspecified: Secondary | ICD-10-CM | POA: Diagnosis not present

## 2024-01-29 DIAGNOSIS — R52 Pain, unspecified: Secondary | ICD-10-CM | POA: Diagnosis not present

## 2024-01-29 DIAGNOSIS — I959 Hypotension, unspecified: Secondary | ICD-10-CM | POA: Diagnosis not present

## 2024-01-29 DIAGNOSIS — J09X1 Influenza due to identified novel influenza A virus with pneumonia: Secondary | ICD-10-CM | POA: Diagnosis not present

## 2024-01-29 DIAGNOSIS — M150 Primary generalized (osteo)arthritis: Secondary | ICD-10-CM | POA: Diagnosis not present

## 2024-01-29 DIAGNOSIS — E785 Hyperlipidemia, unspecified: Secondary | ICD-10-CM | POA: Diagnosis not present

## 2024-01-29 DIAGNOSIS — F411 Generalized anxiety disorder: Secondary | ICD-10-CM | POA: Diagnosis not present

## 2024-01-29 DIAGNOSIS — G9349 Other encephalopathy: Secondary | ICD-10-CM | POA: Diagnosis not present

## 2024-01-29 DIAGNOSIS — K219 Gastro-esophageal reflux disease without esophagitis: Secondary | ICD-10-CM | POA: Diagnosis not present

## 2024-01-29 DIAGNOSIS — R0902 Hypoxemia: Secondary | ICD-10-CM | POA: Diagnosis not present

## 2024-01-29 DIAGNOSIS — R443 Hallucinations, unspecified: Secondary | ICD-10-CM | POA: Diagnosis not present

## 2024-01-29 DIAGNOSIS — Z79899 Other long term (current) drug therapy: Secondary | ICD-10-CM | POA: Diagnosis not present

## 2024-01-29 DIAGNOSIS — I10 Essential (primary) hypertension: Secondary | ICD-10-CM | POA: Diagnosis not present

## 2024-01-29 DIAGNOSIS — D72829 Elevated white blood cell count, unspecified: Secondary | ICD-10-CM | POA: Diagnosis not present

## 2024-01-29 DIAGNOSIS — I5023 Acute on chronic systolic (congestive) heart failure: Secondary | ICD-10-CM | POA: Diagnosis not present

## 2024-01-29 DIAGNOSIS — I4819 Other persistent atrial fibrillation: Secondary | ICD-10-CM | POA: Diagnosis not present

## 2024-01-29 DIAGNOSIS — R531 Weakness: Secondary | ICD-10-CM | POA: Diagnosis not present

## 2024-01-29 DIAGNOSIS — R300 Dysuria: Secondary | ICD-10-CM | POA: Diagnosis not present

## 2024-01-29 DIAGNOSIS — N179 Acute kidney failure, unspecified: Secondary | ICD-10-CM | POA: Diagnosis not present

## 2024-01-29 DIAGNOSIS — J9691 Respiratory failure, unspecified with hypoxia: Secondary | ICD-10-CM | POA: Diagnosis not present

## 2024-01-29 DIAGNOSIS — M542 Cervicalgia: Secondary | ICD-10-CM | POA: Diagnosis not present

## 2024-01-29 DIAGNOSIS — J449 Chronic obstructive pulmonary disease, unspecified: Secondary | ICD-10-CM | POA: Diagnosis not present

## 2024-01-29 DIAGNOSIS — R058 Other specified cough: Secondary | ICD-10-CM | POA: Diagnosis not present

## 2024-01-29 DIAGNOSIS — F5105 Insomnia due to other mental disorder: Secondary | ICD-10-CM | POA: Diagnosis not present

## 2024-01-29 DIAGNOSIS — J45998 Other asthma: Secondary | ICD-10-CM | POA: Diagnosis not present

## 2024-01-29 DIAGNOSIS — E46 Unspecified protein-calorie malnutrition: Secondary | ICD-10-CM | POA: Diagnosis not present

## 2024-01-29 DIAGNOSIS — M109 Gout, unspecified: Secondary | ICD-10-CM | POA: Diagnosis not present

## 2024-01-29 LAB — CBC
HCT: 38.9 % (ref 36.0–46.0)
Hemoglobin: 12.6 g/dL (ref 12.0–15.0)
MCH: 30.7 pg (ref 26.0–34.0)
MCHC: 32.4 g/dL (ref 30.0–36.0)
MCV: 94.9 fL (ref 80.0–100.0)
Platelets: 147 10*3/uL — ABNORMAL LOW (ref 150–400)
RBC: 4.1 MIL/uL (ref 3.87–5.11)
RDW: 14.3 % (ref 11.5–15.5)
WBC: 9.8 10*3/uL (ref 4.0–10.5)
nRBC: 0 % (ref 0.0–0.2)

## 2024-01-29 LAB — COMPREHENSIVE METABOLIC PANEL
ALT: 103 U/L — ABNORMAL HIGH (ref 0–44)
AST: 29 U/L (ref 15–41)
Albumin: 2.5 g/dL — ABNORMAL LOW (ref 3.5–5.0)
Alkaline Phosphatase: 37 U/L — ABNORMAL LOW (ref 38–126)
Anion gap: 6 (ref 5–15)
BUN: 22 mg/dL (ref 8–23)
CO2: 28 mmol/L (ref 22–32)
Calcium: 8.4 mg/dL — ABNORMAL LOW (ref 8.9–10.3)
Chloride: 106 mmol/L (ref 98–111)
Creatinine, Ser: 1.02 mg/dL — ABNORMAL HIGH (ref 0.44–1.00)
GFR, Estimated: 56 mL/min — ABNORMAL LOW (ref >60)
Glucose, Bld: 82 mg/dL (ref 70–99)
Potassium: 3.5 mmol/L (ref 3.5–5.1)
Sodium: 140 mmol/L (ref 135–145)
Total Bilirubin: 0.5 mg/dL (ref 0.0–1.2)
Total Protein: 4.9 g/dL — ABNORMAL LOW (ref 6.5–8.1)

## 2024-01-29 LAB — GLUCOSE, CAPILLARY
Glucose-Capillary: 83 mg/dL (ref 70–99)
Glucose-Capillary: 107 mg/dL — ABNORMAL HIGH (ref 70–99)

## 2024-01-29 MED ORDER — PREDNISONE 20 MG PO TABS: 20.0000 mg | ORAL_TABLET | Freq: Every day | ORAL | Status: DC

## 2024-01-29 MED ORDER — UMECLIDINIUM BROMIDE 62.5 MCG/ACT IN AEPB
1.0000 | INHALATION_SPRAY | Freq: Every day | RESPIRATORY_TRACT | Status: DC
  Administered 2024-01-29: 1 via RESPIRATORY_TRACT
  Filled 2024-01-29: qty 7

## 2024-01-29 MED ORDER — OXYCODONE HCL 5 MG PO TABS: 2.5000 mg | ORAL_TABLET | ORAL | Status: DC | PRN

## 2024-01-29 MED ORDER — PREDNISONE 5 MG PO TABS: ORAL_TABLET | ORAL | 0 refills | Status: AC

## 2024-01-29 MED ORDER — PREDNISONE 10 MG PO TABS: 5.0000 mg | ORAL_TABLET | Freq: Every day | ORAL | Status: DC

## 2024-01-29 MED ORDER — FLUTICASONE FUROATE-VILANTEROL 200-25 MCG/ACT IN AEPB
1.0000 | INHALATION_SPRAY | Freq: Every day | RESPIRATORY_TRACT | Status: DC
  Administered 2024-01-29: 1 via RESPIRATORY_TRACT
  Filled 2024-01-29: qty 28

## 2024-01-29 MED ORDER — MAGNESIUM SULFATE 2 GM/50ML IV SOLN
2.0000 g | Freq: Once | INTRAVENOUS | Status: AC
  Administered 2024-01-29: 2 g via INTRAVENOUS
  Filled 2024-01-29: qty 50

## 2024-01-29 MED ORDER — METOPROLOL SUCCINATE ER 50 MG PO TB24: 50.0000 mg | ORAL_TABLET | Freq: Every day | ORAL | 3 refills | Status: AC

## 2024-01-29 MED ORDER — METOPROLOL SUCCINATE ER 50 MG PO TB24
50.0000 mg | ORAL_TABLET | Freq: Every day | ORAL | Status: DC
  Filled 2024-01-29: qty 1

## 2024-01-29 MED ORDER — POTASSIUM CHLORIDE CRYS ER 20 MEQ PO TBCR
40.0000 meq | EXTENDED_RELEASE_TABLET | Freq: Once | ORAL | Status: DC
Start: 2024-01-29 — End: 2024-01-29

## 2024-01-29 MED ORDER — PREDNISONE 5 MG PO TABS: 5.0000 mg | ORAL_TABLET | Freq: Every day | ORAL | 3 refills | Status: AC

## 2024-01-29 MED ORDER — POTASSIUM CHLORIDE 20 MEQ PO PACK
40.0000 meq | PACK | Freq: Once | ORAL | Status: AC
  Administered 2024-01-29: 40 meq via ORAL
  Filled 2024-01-29: qty 2

## 2024-01-29 MED ORDER — PREDNISONE 20 MG PO TABS
40.0000 mg | ORAL_TABLET | Freq: Every day | ORAL | Status: DC
  Administered 2024-01-29: 40 mg via ORAL

## 2024-01-29 MED ORDER — AMIODARONE HCL 200 MG PO TABS: 200.0000 mg | ORAL_TABLET | Freq: Every day | ORAL | Status: DC

## 2024-01-29 MED ORDER — AMIODARONE HCL 200 MG PO TABS: 200.0000 mg | ORAL_TABLET | Freq: Every day | ORAL | 0 refills | Status: AC

## 2024-01-29 NOTE — Discharge Summary (Signed)
Physician Discharge Summary  Patient ID: Lisa Larson MRN: [id number] DOB/AGE: Jul 02, 1941 83 y.o.  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  Given tamiflu, steroids with improvement.  Also started on amiodarone and metoprolol by cardiology.  Not LHC candidate nor a AC candidate due to fall risk and advanced dementia.    Now rate controlled.  Will taper her steroids on DC back to 5mg  daily.  Will arrange f/u with Dr. Celine Mans in pulmonary office and with cardiology as well for afib.   Discharge Exam: Pulse 60s afib Sats 95% 4LPM  No distress Advanced arthritic changes Irregular heart rhythm Scattered bruising Moves to command AO to self Abd soft Lungs no wheezing  Disposition: SNF   Allergies as of 01/29/2024       Reactions   Diclofenac Other (See Comments)   Unknown reaction  Other reaction(s): Trouble Breathing/Hives   Other Rash, Shortness Of Breath   Oxycodone Shortness Of Breath   Other reaction(s): Trouble Breathing   Rofecoxib Hives   Unknown reaction  Other reaction(s): Hives/Trouble Breathing   Sulfa Antibiotics Shortness Of Breath   Other reaction(s): Trouble Breathing   Sulfonamide Derivatives Shortness Of Breath, Rash   Oxycodone-aspirin Other (See Comments)   Couldn't hear    Prolia [denosumab]    Muscle pain, bone pain   Ace Inhibitors Cough   Other reaction(s): Trouble Breathing/Hives Other  reaction(s): Trouble Breathing/Hives   Benazepril Hcl Other (See Comments)   No PMH of angioedema; ACE-I caused cough   Tramadol Itching        Medication List     STOP taking these medications    diltiazem 120 MG 24 hr capsule Commonly known as: CARDIZEM CD   OLANZapine 5 MG tablet Commonly known as: ZyPREXA       TAKE these medications    acetaminophen 500 MG tablet Commonly known as: TYLENOL Take 1,000 mg by mouth in the morning, at noon, and at bedtime.   albuterol 108 (90 Base) MCG/ACT inhaler Commonly known as: ProAir HFA Inhale 2 puffs into the lungs every 6 (six) hours as needed for wheezing or shortness of breath.   amiodarone 200 MG tablet Commonly known as: PACERONE Take 1 tablet (200 mg total) by mouth daily. Start taking on: January 30, 2024   calcium carbonate 600 MG tablet Commonly known as: OS-CAL Take 600 mg by mouth 2 (two) times daily.   gabapentin 300 MG capsule Commonly known as: NEURONTIN TAKE ONE CAPSULE BY MOUTH THREE TIMES DAILY   metoprolol succinate 50 MG 24 hr tablet Commonly known as: TOPROL-XL Take 1 tablet (50 mg total) by mouth daily. Take with or immediately following a meal.   multivitamin tablet Take 1 tablet by mouth daily.   Nucala 100 MG/ML Soaj Generic drug: Mepolizumab Inject 1 mL (100 mg total) into the skin every 28 (twenty-eight) days. Deliver to patient's home for self-administration.   predniSONE 5 MG tablet Commonly known as: DELTASONE Take 1 tablet (5 mg total) by mouth daily with breakfast. What changed: You were already taking a  medication with the same name, and this prescription was added. Make sure you understand how and when to take each.   predniSONE 5 MG tablet Commonly known as: DELTASONE Take 8 tablets (40 mg total) by mouth daily with breakfast for 3 days, THEN 4 tablets (20 mg total) daily with breakfast for 3 days. Start taking on: January 30, 2024 What changed: See the new instructions.    QUEtiapine 25 MG tablet Commonly known as: SEROQUEL Take 25-50 mg by mouth See admin instructions. Take one tablet by mouth at noon and then take 50 mg at bedtime per son   sertraline 100 MG tablet Commonly known as: ZOLOFT TAKE TWO TABLETS BY MOUTH ONCE DAILY FOR ANXIETY What changed: See the new instructions.   Vitamin D3 50 MCG (2000 UT) capsule Take 2,000 Units by mouth daily.        Contact information for after-discharge care     Destination     [facility] Preferred SNF .   Service: Skilled Nursing Contact information: [address] [place] [address] [phone]                     Signed: Lorin Glass 01/29/2024, 12:46 PM

## 2024-01-29 NOTE — Progress Notes (Signed)
Rounding Note    Patient Name: Lisa Larson Date of Encounter: 01/29/2024  [facility] Cardiologist: Thomasene Ripple, DO   Subjective   Denies CP or dyspnea  Inpatient Medications    Scheduled Meds:  amiodarone  200 mg Oral BID   arformoterol  15 mcg Nebulization BID   budesonide (PULMICORT) nebulizer solution  0.5 mg Nebulization BID   Chlorhexidine Gluconate Cloth  6 each Topical Daily   enoxaparin (LOVENOX) injection  40 mg Subcutaneous Q24H   feeding supplement  237 mL Oral BID BM   gabapentin  300 mg Oral TID   insulin aspart  0-9 Units Subcutaneous TID WC   metoprolol tartrate  25 mg Oral BID   predniSONE  40 mg Oral Q breakfast   QUEtiapine  25 mg Oral Q lunch   QUEtiapine  50 mg Oral QHS   revefenacin  175 mcg Nebulization Daily   Continuous Infusions:   PRN Meds: acetaminophen, docusate sodium, haloperidol lactate, levalbuterol, loperamide, ondansetron (ZOFRAN) IV, mouth rinse, polyethylene glycol   Vital Signs    Vitals:   01/29/24 0400 01/29/24 0413 01/29/24 0600 01/29/24 0738  BP: (!) 117/92  (!) 135/98   Pulse: 74  73   Resp: (!) 21  17   Temp:  97.6 F (36.4 C)  97.8 F (36.6 C)  TempSrc:  Axillary  Axillary  SpO2: 94%  92%   Weight:      Height:        Intake/Output Summary (Last 24 hours) at 01/29/2024 0928 Last data filed at 01/29/2024 0600 Gross per 24 hour  Intake 480 ml  Output 350 ml  Net 130 ml      01/29/2024    3:35 AM 01/28/2024    6:12 AM 01/25/2024    4:08 AM  Last 3 Weights  Weight (lbs) 144 lb 10 oz 144 lb 10 oz 151 lb 7.3 oz  Weight (kg) 65.6 kg 65.6 kg 68.7 kg      Telemetry    Atrial fibrillation with PVCs or aberrantly conducted beats rate controlled- Personally Reviewed   Physical Exam   GEN: NAD Neck: supple Cardiac: irregular Respiratory: CTA GI: Soft, NT/ND MS: No edema Neuro:  No focal findings  Labs    High Sensitivity Troponin:   Recent Labs  Lab 01/18/24 1450 01/22/24 0955  01/22/24 1332  TROPONINIHS 11 1,573* 1,446*     Chemistry Recent Labs  Lab 01/22/24 0955 01/22/24 0956 01/23/24 0456 01/24/24 0213 01/26/24 0534 01/27/24 0232 01/28/24 0310 01/29/24 0252  NA 143   < > 138   < > 142 137 141 140  K 3.6   < > 4.0   < > 4.2 3.9 3.7 3.5  CL 105  --  104   < > 108 104 107 106  CO2 22  --  24   < > 24 23 26 28   GLUCOSE 161*  --  116*   < > 105* 126* 90 82  BUN 22  --  19   < > 27* 32* 28* 22  CREATININE 1.86*   < > 0.97   < > 1.03* 1.09* 1.11* 1.02*  CALCIUM 9.5  --  8.9   < > 8.8* 8.4* 8.5* 8.4*  MG 1.9  --  2.0  --   --   --   --   --   PROT 7.2  --  5.7*   < > 5.1* 5.4*  --  4.9*  ALBUMIN 4.0  --  3.0*   < > 2.8* 2.8*  --  2.5*  AST 244*  --  383*   < > 109* 66*  --  29  ALT 165*  --  281*   < > 212* 174*  --  103*  ALKPHOS 50  --  47   < > 31* 37*  --  37*  BILITOT 0.7  --  0.5   < > 0.5 0.5  --  0.5  GFRNONAA 27*   < > 59*   < > 55* 51* 50* 56*  ANIONGAP 16*  --  10   < > 10 10 8 6    < > = values in this interval not displayed.    Hematology Recent Labs  Lab 01/27/24 0232 01/28/24 0310 01/29/24 0252  WBC 10.8* 10.2 9.8  RBC 3.80* 4.00 4.10  HGB 11.8* 12.3 12.6  HCT 36.6 37.8 38.9  MCV 96.3 94.5 94.9  MCH 31.1 30.8 30.7  MCHC 32.2 32.5 32.4  RDW 13.9 14.0 14.3  PLT 139* 137* 147*     BNP Recent Labs  Lab 01/22/24 0955  BNP 301.6*      Patient Profile     83 year old female with dementia, paroxysmal atrial fibrillation (not on anticoagulation due to fall risk, shared decision making between primary cardiologist and patient and family), COPD, frequent hallucinations related to dementia, prior tobacco abuse who was admitted on 01/22/2024 with septic shock secondary to influenza pneumonia. Cardiology consulted for elevated cardiac enzymes and A-fib with RVR.  Echocardiogram shows ejection fraction 25 to 30%, moderate RV dysfunction, mild left atrial enlargement, moderate right atrial enlargement, moderate pericardial effusion  posterior to the left ventricle, mild to moderate mitral regurgitation, mild to moderate tricuspid regurgitation.  Assessment & Plan    1 atrial fibrillation with rapid ventricular response -patient admitted with severe influenza A infection.  Patient's initial blood pressure was borderline.  She was therefore initiated on amiodarone for rate control.  Liver functions continue to improve and blood pressure remains borderline with systolic documented at 84 yesterday (improved this morning).  I would favor continuing amiodarone but will change to 200 mg daily.  Change metoprolol to Toprol 50 mg daily.  Patient can be seen as an outpatient and follow-up and if blood pressure continues to improve amiodarone could potentially be discontinued with advancement of Toprol as needed.  She is felt not to be a long-term anticoagulation candidate due to risk of fall.  2 cardiomyopathy-patient's troponin was elevated but she is felt not to be candidate for cardiac catheterization.  I am hesitant to add guideline directed medical therapy at this point due to intermittent low blood pressure.  This can be reassessed as an outpatient.  Notes she is not in CHF on examination.  Would repeat echocardiogram in 3 months to see if LV function has improved.  3 history of dementia  4 no CODE BLUE  Long discussion with patient's daughter by phone concerning the above.  She would like to focus on comfort and only necessary medications.  We discussed the plan as outlined above.  For questions or updates, please contact  [facility] Please consult [email or web address] for contact info under        Signed, Olga Millers, MD  01/29/2024, 9:28 AM
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
## 2024-01-29 NOTE — Telephone Encounter (Signed)
Called PT and left VM

## 2024-01-29 NOTE — TOC Progression Note (Addendum)
Transition of Care [facility]) - Progression Note    Patient Details  Name: Lisa Larson MRN: [id number] Date of Birth: 05-17-1941  Transition of Care [facility]) CM/SW Contact  Dellie Burns [place], [place] Phone Number: 01/29/2024, 10:15 AM  Clinical Narrative:  Spoke to pt's dtr [name] re SNF choice and she has accepted [facility].  Notified [name] in [facility] admissions.   UPDATE 1045: Per MD, pt medically stable for dc. Confirmed with [name] at [facility] they are prepared to admit today. Voicemail left for pt's dtr re potential dc and updated MD.   Dellie Burns, MSW, LCSW [phone] (coverage)       Expected Discharge Plan: Skilled Nursing Facility Barriers to Discharge: Continued Medical Work up, SNF Pending bed offer  Expected Discharge Plan and Services       Living arrangements for the past 2 months: Skilled Nursing Facility                                       Social Determinants of Health (SDOH) Interventions SDOH Screenings   Food Insecurity: Patient Unable To Answer (01/25/2024)  Housing: Patient Unable To Answer (01/25/2024)  Transportation Needs: Patient Unable To Answer (01/25/2024)  Utilities: Patient Unable To Answer (01/25/2024)  Alcohol Screen: Low Risk  (08/01/2022)  Depression (PHQ2-9): Medium Risk (08/03/2023)  Financial Resource Strain: Low Risk  (08/03/2023)  Physical Activity: Inactive (08/03/2023)  Social Connections: Unknown (01/25/2024)  Stress: No Stress Concern Present (08/03/2023)  Recent Concern: Stress - Stress Concern Present (05/08/2023)  Tobacco Use: Medium Risk (01/22/2024)  Health Literacy: Adequate Health Literacy (08/03/2023)    Readmission Risk Interventions     No data to display

## 2024-01-29 NOTE — Progress Notes (Signed)
Patient transferred to [facility] via ambulance transport. Report called in to [facility], LPN at facility. Patient's daughter, Elmarie Shiley informed of transport. All personal belongings accompanied patient at time of transport.

## 2024-01-29 NOTE — TOC Transition Note (Signed)
Transition of Care [facility]) - Discharge Note   Patient Details  Name: Lisa Larson MRN: [id number] Date of Birth: 27-Apr-1941  Transition of Care [facility]) CM/SW Contact:  Deatra [place], [place] Phone Number: 01/29/2024, 1:03 PM   Clinical Narrative: Pt for dc to [facility] today. Spoke to [name] in admissions who confirmed they are prepared to admit pt to room 106P. Pt's dtr [name] aware of dc and reports agreeable. RN provided with number for report and [facility] arranged for transport. Dtr requesting notification when [facility] arrives, RN made aware. SW signing off at dc.   Dellie Burns, MSW, LCSW [phone] (coverage)        Final next level of care: Skilled Nursing Facility Barriers to Discharge: Barriers Resolved   Patient Goals and CMS Choice     Choice offered to / list presented to : Patient (daughter)      Discharge Placement              Patient chooses bed at: [facility] Patient to be transferred to facility by: [facility] Name of family member notified: [name]/dtr Patient and family notified of of transfer: 01/29/24  Discharge Plan and Services Additional resources added to the After Visit Summary for                                       Social Drivers of Health (SDOH) Interventions SDOH Screenings   Food Insecurity: Patient Unable To Answer (01/25/2024)  Housing: Patient Unable To Answer (01/25/2024)  Transportation Needs: Patient Unable To Answer (01/25/2024)  Utilities: Patient Unable To Answer (01/25/2024)  Alcohol Screen: Low Risk  (08/01/2022)  Depression (PHQ2-9): Medium Risk (08/03/2023)  Financial Resource Strain: Low Risk  (08/03/2023)  Physical Activity: Inactive (08/03/2023)  Social Connections: Unknown (01/25/2024)  Stress: No Stress Concern Present (08/03/2023)  Recent Concern: Stress - Stress Concern Present (05/08/2023)  Tobacco Use: Medium Risk (01/22/2024)  Health Literacy: Adequate Health Literacy (08/03/2023)      Readmission Risk Interventions     No data to display

## 2024-02-01 DIAGNOSIS — E46 Unspecified protein-calorie malnutrition: Secondary | ICD-10-CM | POA: Diagnosis not present

## 2024-02-01 DIAGNOSIS — R443 Hallucinations, unspecified: Secondary | ICD-10-CM | POA: Diagnosis not present

## 2024-02-01 DIAGNOSIS — B372 Candidiasis of skin and nail: Secondary | ICD-10-CM | POA: Diagnosis not present

## 2024-02-01 DIAGNOSIS — M6281 Muscle weakness (generalized): Secondary | ICD-10-CM | POA: Diagnosis not present

## 2024-02-01 DIAGNOSIS — D649 Anemia, unspecified: Secondary | ICD-10-CM | POA: Diagnosis not present

## 2024-02-01 DIAGNOSIS — G3184 Mild cognitive impairment, so stated: Secondary | ICD-10-CM | POA: Diagnosis not present

## 2024-02-01 DIAGNOSIS — G319 Degenerative disease of nervous system, unspecified: Secondary | ICD-10-CM | POA: Diagnosis not present

## 2024-02-02 NOTE — Progress Notes (Deleted)
 Cardiology Office Note:  .   Date:  02/02/2024  ID:  Lisa Larson, DOB 19-Jan-1941, MRN [id number] PCP: Lisa Glade PARAS, MD  [facility] Providers Cardiologist:  Dub Huntsman, DO  History of Present Illness: .   Lisa Larson is a 83 y.o. female with a past medical history of paroxysmal atrial fibrillation, HTN, COPD, hallucinations and dementia, arthritis, prior tobacco use. Patient is followed by Dr. Huntsman and presents today for a hospital follow up appointment.   Patient had a hip replacement in 02/2022, and was found to be in atrial fibrillation while hospitalized. She underwent echocardiogram on 03/04/22 that showed EF 60-65%, no regional wall motion abnormalities, low normal RV systolic function, normal pulmonary artery systolic pressure, moderate TR.  Started on cardizem . Was not started on anticoagulation at that time due to concerns about her frequent falls. Later, patient wore a cardiac monitor in 04/2023 that showed continuous atrial fibrillation (100% burden)   Patient was admitted from 1/24-1/31/25 with respiratory distress and influenza. She had presented to the ED complaining of dyspnea that had been going on for 3 days. Found to be positive for influenza A. Also found to be in atrial fibrillation with RVR. Her blood pressure was too low for diltiazem , so she was started on IV amiodarone  and IV heparin . hsTn 88>8426>8553. Suspected to have been demand ischemia with her severe flu infection. Underwent echocardiogram on 01/23/24 that showed EF 25-30% with regional wall motion abnormalities, moderately reduced RV systolic function, mildly elevated PA systolic pressure, moderate pericardial effusion, mild-moderate MR. She was felt to not be a candidate for cardiac catheterization. She remained on amiodarone  and started low dose metoprolol . Was not started on GDMT due to low BP. Recommended repeat echocardiogram in 3 weeks.   Persistent Atrial Fibrillation  Elevated LFTs  -  Patient was first found to be in atrial fibrillation following hip surgery in 02/2022. Was initially treated with diltiazem , was not on [facility] due to frequent falls.  - Recently admitted with sepsis/flu- developed afib with RVR. BP was low and EF found to be 25-30%. Treated with amio and metoprolol   - Now on amiodarone  200 mg daily, metoprolol  succinate 50 mg daily  -  - Note, during recent admission, patient had transient elevation in liver enzymes. Enzymes had normalized prior to DC  - CMP  - Patient not on AC--   Cardiomyopathy  Moderate pericardial effusion  Mild-moderate MR  - Echocardiogram from 12/2023 showed EF 25-30% with regional wall motion abnormalities, moderately reduced RV systolic function, moderate pericardial effusion, mild-moderate MR - Patient felt to not be a candidate for catheterization. When recently admitted, her BP was low so she was not started on GDMT  -  - Continue metoprolol  succinate 50 mg daily  - Effusion? - Repeat echocardiogram in 3 months   Dementia  Hallucinations   ROS: ***  Studies Reviewed: .        *** Risk Assessment/Calculations:   {Does this patient have ATRIAL FIBRILLATION?[phone]} No BP recorded.  {Refresh Note OR Click here to enter BP  :1}***       Physical Exam:   VS:  There were no vitals taken for this visit.   Wt Readings from Last 3 Encounters:  01/29/24 144 lb 10 oz (65.6 kg)  01/18/24 132 lb 0.9 oz (59.9 kg)  12/16/23 132 lb (59.9 kg)    GEN: Well nourished, well developed in no acute distress NECK: No JVD; No carotid bruits CARDIAC: ***RRR, no murmurs,  rubs, gallops RESPIRATORY:  Clear to auscultation without rales, wheezing or rhonchi  ABDOMEN: Soft, non-tender, non-distended EXTREMITIES:  No edema; No deformity   ASSESSMENT AND PLAN: .   ***    {Are you ordering a CV Procedure (e.g. stress test, cath, DCCV, TEE, etc)?   Press F2        :[id number]}  Dispo: ***  Signed, Rollo FABIENE Louder, PA-C

## 2024-02-03 DIAGNOSIS — F0280 Dementia in other diseases classified elsewhere without behavioral disturbance: Secondary | ICD-10-CM | POA: Diagnosis not present

## 2024-02-03 DIAGNOSIS — G3183 Dementia with Lewy bodies: Secondary | ICD-10-CM | POA: Diagnosis not present

## 2024-02-03 DIAGNOSIS — J111 Influenza due to unidentified influenza virus with other respiratory manifestations: Secondary | ICD-10-CM | POA: Diagnosis not present

## 2024-02-03 DIAGNOSIS — J9691 Respiratory failure, unspecified with hypoxia: Secondary | ICD-10-CM | POA: Diagnosis not present

## 2024-02-03 NOTE — Telephone Encounter (Signed)
 Son Lisa Larson [phone] Daughter Lisa Larson [phone]   I spoke with son. He states the PT is @ [facility] on [address]for a extended rehab. He asks if Dr. Dione Franks could see her there. I said I did not think so. Please let me know. TY.

## 2024-02-03 NOTE — Telephone Encounter (Signed)
 No I do not. I can do this as a virtual visit if desired.

## 2024-02-03 NOTE — Telephone Encounter (Signed)
 Lm x1 for patient's son, Sammie Crigler.  Please schedule virtual visit.

## 2024-02-04 DIAGNOSIS — R531 Weakness: Secondary | ICD-10-CM | POA: Diagnosis not present

## 2024-02-04 DIAGNOSIS — D72829 Elevated white blood cell count, unspecified: Secondary | ICD-10-CM | POA: Diagnosis not present

## 2024-02-04 DIAGNOSIS — F0280 Dementia in other diseases classified elsewhere without behavioral disturbance: Secondary | ICD-10-CM | POA: Diagnosis not present

## 2024-02-04 DIAGNOSIS — R300 Dysuria: Secondary | ICD-10-CM | POA: Diagnosis not present

## 2024-02-04 DIAGNOSIS — N182 Chronic kidney disease, stage 2 (mild): Secondary | ICD-10-CM | POA: Diagnosis not present

## 2024-02-04 NOTE — Telephone Encounter (Signed)
 I called and spoke with pts son Will (DPR) I informed Will of Dr Correne message and explained to Will we can have a virtual appointment made for the pt if that was okay with her. Will stated he would discuss that with the pt and his sister, and would call our office back regarding appointment. NFN. Routing to front desk for scheduling if the pt wants a virtual with Dr Meade.

## 2024-02-05 ENCOUNTER — Ambulatory Visit: Payer: Medicare Other | Admitting: Cardiology

## 2024-02-05 DIAGNOSIS — R52 Pain, unspecified: Secondary | ICD-10-CM | POA: Diagnosis not present

## 2024-02-05 DIAGNOSIS — F0280 Dementia in other diseases classified elsewhere without behavioral disturbance: Secondary | ICD-10-CM | POA: Diagnosis not present

## 2024-02-05 DIAGNOSIS — R531 Weakness: Secondary | ICD-10-CM | POA: Diagnosis not present

## 2024-02-06 ENCOUNTER — Encounter: Payer: Self-pay | Admitting: Cardiology

## 2024-02-08 DIAGNOSIS — G3184 Mild cognitive impairment, so stated: Secondary | ICD-10-CM | POA: Diagnosis not present

## 2024-02-08 DIAGNOSIS — E46 Unspecified protein-calorie malnutrition: Secondary | ICD-10-CM | POA: Diagnosis not present

## 2024-02-08 DIAGNOSIS — D649 Anemia, unspecified: Secondary | ICD-10-CM | POA: Diagnosis not present

## 2024-02-08 DIAGNOSIS — F0280 Dementia in other diseases classified elsewhere without behavioral disturbance: Secondary | ICD-10-CM | POA: Diagnosis not present

## 2024-02-08 DIAGNOSIS — R059 Cough, unspecified: Secondary | ICD-10-CM | POA: Diagnosis not present

## 2024-02-08 DIAGNOSIS — R531 Weakness: Secondary | ICD-10-CM | POA: Diagnosis not present

## 2024-02-08 DIAGNOSIS — G319 Degenerative disease of nervous system, unspecified: Secondary | ICD-10-CM | POA: Diagnosis not present

## 2024-02-08 DIAGNOSIS — R062 Wheezing: Secondary | ICD-10-CM | POA: Diagnosis not present

## 2024-02-09 DIAGNOSIS — R062 Wheezing: Secondary | ICD-10-CM | POA: Diagnosis not present

## 2024-02-09 DIAGNOSIS — R059 Cough, unspecified: Secondary | ICD-10-CM | POA: Diagnosis not present

## 2024-02-09 DIAGNOSIS — R531 Weakness: Secondary | ICD-10-CM | POA: Diagnosis not present

## 2024-02-09 DIAGNOSIS — J90 Pleural effusion, not elsewhere classified: Secondary | ICD-10-CM | POA: Diagnosis not present

## 2024-02-09 DIAGNOSIS — F0280 Dementia in other diseases classified elsewhere without behavioral disturbance: Secondary | ICD-10-CM | POA: Diagnosis not present

## 2024-02-11 DIAGNOSIS — J90 Pleural effusion, not elsewhere classified: Secondary | ICD-10-CM | POA: Diagnosis not present

## 2024-02-11 DIAGNOSIS — F0280 Dementia in other diseases classified elsewhere without behavioral disturbance: Secondary | ICD-10-CM | POA: Diagnosis not present

## 2024-02-11 DIAGNOSIS — J449 Chronic obstructive pulmonary disease, unspecified: Secondary | ICD-10-CM | POA: Diagnosis not present

## 2024-02-11 DIAGNOSIS — R531 Weakness: Secondary | ICD-10-CM | POA: Diagnosis not present

## 2024-02-11 DIAGNOSIS — R059 Cough, unspecified: Secondary | ICD-10-CM | POA: Diagnosis not present

## 2024-02-11 NOTE — Telephone Encounter (Signed)
Appointment Request From: [name] With Provider: Charlott Holler [facility] [place]] Preferred Date Range: 02/11/2024 - 02/12/2024 Preferred Times: Any Time Reason for visit: Office Visit Health Maintenance Topic:  Comments: o2 needed after ICU illness and still coughing up phlem, very thick. Call either Will at [phone] or [name] at [phone] (6 hrs ahead in [place]) to schedule appt.  Mom is currently in [facility].    D/C notes: Discharge Exam: Pulse 60s afib Sats 95% 4LPM  Please contact patient's family to schedule. Per Celine Mans ok for VV.

## 2024-02-12 DIAGNOSIS — J90 Pleural effusion, not elsewhere classified: Secondary | ICD-10-CM | POA: Diagnosis not present

## 2024-02-12 DIAGNOSIS — F0280 Dementia in other diseases classified elsewhere without behavioral disturbance: Secondary | ICD-10-CM | POA: Diagnosis not present

## 2024-02-12 DIAGNOSIS — R531 Weakness: Secondary | ICD-10-CM | POA: Diagnosis not present

## 2024-02-12 DIAGNOSIS — R059 Cough, unspecified: Secondary | ICD-10-CM | POA: Diagnosis not present

## 2024-02-15 DIAGNOSIS — F0280 Dementia in other diseases classified elsewhere without behavioral disturbance: Secondary | ICD-10-CM | POA: Diagnosis not present

## 2024-02-15 DIAGNOSIS — E46 Unspecified protein-calorie malnutrition: Secondary | ICD-10-CM | POA: Diagnosis not present

## 2024-02-15 DIAGNOSIS — R059 Cough, unspecified: Secondary | ICD-10-CM | POA: Diagnosis not present

## 2024-02-15 DIAGNOSIS — G319 Degenerative disease of nervous system, unspecified: Secondary | ICD-10-CM | POA: Diagnosis not present

## 2024-02-15 DIAGNOSIS — J449 Chronic obstructive pulmonary disease, unspecified: Secondary | ICD-10-CM | POA: Diagnosis not present

## 2024-02-15 DIAGNOSIS — G3184 Mild cognitive impairment, so stated: Secondary | ICD-10-CM | POA: Diagnosis not present

## 2024-02-15 DIAGNOSIS — R531 Weakness: Secondary | ICD-10-CM | POA: Diagnosis not present

## 2024-02-15 DIAGNOSIS — D649 Anemia, unspecified: Secondary | ICD-10-CM | POA: Diagnosis not present

## 2024-02-15 DIAGNOSIS — M542 Cervicalgia: Secondary | ICD-10-CM | POA: Diagnosis not present

## 2024-02-17 DIAGNOSIS — F0280 Dementia in other diseases classified elsewhere without behavioral disturbance: Secondary | ICD-10-CM | POA: Diagnosis not present

## 2024-02-17 DIAGNOSIS — R531 Weakness: Secondary | ICD-10-CM | POA: Diagnosis not present

## 2024-02-17 DIAGNOSIS — M542 Cervicalgia: Secondary | ICD-10-CM | POA: Diagnosis not present

## 2024-02-17 DIAGNOSIS — J449 Chronic obstructive pulmonary disease, unspecified: Secondary | ICD-10-CM | POA: Diagnosis not present
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
## 2024-02-18 DIAGNOSIS — G3183 Dementia with Lewy bodies: Secondary | ICD-10-CM | POA: Diagnosis not present

## 2024-02-18 DIAGNOSIS — F331 Major depressive disorder, recurrent, moderate: Secondary | ICD-10-CM | POA: Diagnosis not present

## 2024-02-18 DIAGNOSIS — F411 Generalized anxiety disorder: Secondary | ICD-10-CM | POA: Diagnosis not present

## 2024-02-18 DIAGNOSIS — F5105 Insomnia due to other mental disorder: Secondary | ICD-10-CM | POA: Diagnosis not present

## 2024-02-19 ENCOUNTER — Ambulatory Visit: Payer: Medicare Other | Attending: Physician Assistant | Admitting: Physician Assistant

## 2024-02-19 ENCOUNTER — Encounter: Payer: Self-pay | Admitting: Physician Assistant

## 2024-02-19 ENCOUNTER — Telehealth: Payer: Medicare Other | Admitting: Internal Medicine

## 2024-02-19 ENCOUNTER — Encounter: Payer: Self-pay | Admitting: Internal Medicine

## 2024-02-19 VITALS — BP 92/58 | HR 77 | Ht 60.0 in | Wt 133.0 lb

## 2024-02-19 DIAGNOSIS — J8281 Chronic eosinophilic pneumonia: Secondary | ICD-10-CM | POA: Diagnosis not present

## 2024-02-19 DIAGNOSIS — I480 Paroxysmal atrial fibrillation: Secondary | ICD-10-CM | POA: Insufficient documentation

## 2024-02-19 DIAGNOSIS — F02B2 Dementia in other diseases classified elsewhere, moderate, with psychotic disturbance: Secondary | ICD-10-CM | POA: Insufficient documentation

## 2024-02-19 DIAGNOSIS — I959 Hypotension, unspecified: Secondary | ICD-10-CM | POA: Diagnosis not present

## 2024-02-19 DIAGNOSIS — I10 Essential (primary) hypertension: Secondary | ICD-10-CM | POA: Diagnosis not present

## 2024-02-19 DIAGNOSIS — I5023 Acute on chronic systolic (congestive) heart failure: Secondary | ICD-10-CM | POA: Insufficient documentation

## 2024-02-19 DIAGNOSIS — J4550 Severe persistent asthma, uncomplicated: Secondary | ICD-10-CM

## 2024-02-19 DIAGNOSIS — G3183 Dementia with Lewy bodies: Secondary | ICD-10-CM | POA: Diagnosis not present

## 2024-02-19 DIAGNOSIS — R058 Other specified cough: Secondary | ICD-10-CM

## 2024-02-19 DIAGNOSIS — Z79899 Other long term (current) drug therapy: Secondary | ICD-10-CM | POA: Diagnosis not present

## 2024-02-19 DIAGNOSIS — J101 Influenza due to other identified influenza virus with other respiratory manifestations: Secondary | ICD-10-CM

## 2024-02-19 MED ORDER — FUROSEMIDE 20 MG PO TABS: 20.0000 mg | ORAL_TABLET | Freq: Every day | ORAL | 11 refills | Status: AC

## 2024-02-19 MED ORDER — POTASSIUM CHLORIDE ER 10 MEQ PO TBCR
10.0000 meq | EXTENDED_RELEASE_TABLET | Freq: Every day | ORAL | 11 refills | Status: AC
Start: 2024-02-19 — End: 2024-05-19

## 2024-02-19 MED ORDER — MIDODRINE HCL 5 MG PO TABS: 5.0000 mg | ORAL_TABLET | Freq: Three times a day (TID) | ORAL | 11 refills | Status: DC

## 2024-02-19 NOTE — Patient Instructions (Addendum)
It was a pleasure to see you today!  Please schedule follow up scheduled with myself in 2 months.  If my schedule is not open yet, we will contact you with a reminder closer to that time. Please call [phone] if you haven't heard from [place] a month before, and always call us sooner if issues or concerns arise. You can also send [place] a message through MyChart, but but aware that this is not to be used for urgent issues and it may take up to 5-7 days to receive a reply. Please be aware that you will likely be able to view your results before I have a chance to respond to them. Please give [place] 5 business days to respond to any non-urgent results.    Asthma and eosinophilic pneumonia well controlled on mepolizumab. Continue nucala injections  Cough could be post-viral from the flu. Could also be related to the extra fluid on your lungs. Hopefully taking the new diuretic medication will help. If it does not, you can also take your albuterol and fluticasone inhaler more frequently.  We will follow up in person and pursue next steps if the cough is not improved by then.

## 2024-02-19 NOTE — Patient Instructions (Addendum)
Medication Instructions:  Printed scripts for midodrine 5 mg tid with meals, furosemide 20 mg daily, potassium chloride 10 meq daily(pending BMP results)  faxed to nursing facility. [phone] *If you need a refill on your cardiac medications before your next appointment, please call your pharmacy*   Lab Work: CMETin one week at facility. If you have labs (blood work) drawn today and your tests are completely normal, you will receive your results only by: MyChart Message (if you have MyChart) OR A paper copy in the mail If you have any lab test that is abnormal or we need to change your treatment, we will call you to review the results.   Late add on test. Your physician has requested that you have an echocardiogram. Facility will call to schedule. Echocardiography is a painless test that uses sound waves to create images of your heart. It provides your doctor with information about the size and shape of your heart and how well your heart's chambers and valves are working. This procedure takes approximately one hour. There are no restrictions for this procedure. Please do NOT wear cologne, perfume, aftershave, or lotions (deodorant is allowed). Please arrive 15 minutes prior to your appointment time.  Please note: We ask at that you not bring children with you during ultrasound (echo/ vascular) testing. Due to room size and safety concerns, children are not allowed in the ultrasound rooms during exams. Our front office staff cannot provide observation of children in our lobby area while testing is being conducted. An adult accompanying a patient to their appointment will only be allowed in the ultrasound room at the discretion of the ultrasound technician under special circumstances. We apologize for any inconvenience.  Follow-Up: At [facility], you and your health needs are our priority.  As part of our continuing mission to provide you with exceptional heart care, we have created  designated Provider Care Teams.  These Care Teams include your primary Cardiologist (physician) and Advanced Practice Providers (APPs -  Physician Assistants and Nurse Practitioners) who all work together to provide you with the care you need, when you need it.  We recommend signing up for the patient portal called "MyChart".  Sign up information is provided on this After Visit Summary.  MyChart is used to connect with patients for Virtual Visits (Telemedicine).  Patients are able to view lab/test results, encounter notes, upcoming appointments, etc.  Non-urgent messages can be sent to your provider as well.   To learn more about what you can do with MyChart, go to [email or web address].    Your next appointment:   3 month(s)  Provider:   Micah Flesher, PA-C        Other Instructions
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
## 2024-02-23 DIAGNOSIS — F0280 Dementia in other diseases classified elsewhere without behavioral disturbance: Secondary | ICD-10-CM | POA: Diagnosis not present

## 2024-02-23 DIAGNOSIS — J449 Chronic obstructive pulmonary disease, unspecified: Secondary | ICD-10-CM | POA: Diagnosis not present

## 2024-02-23 DIAGNOSIS — R531 Weakness: Secondary | ICD-10-CM | POA: Diagnosis not present

## 2024-03-01 ENCOUNTER — Encounter: Payer: Self-pay | Admitting: Physician Assistant

## 2024-03-01 DIAGNOSIS — F0280 Dementia in other diseases classified elsewhere without behavioral disturbance: Secondary | ICD-10-CM | POA: Diagnosis not present

## 2024-03-01 DIAGNOSIS — R531 Weakness: Secondary | ICD-10-CM | POA: Diagnosis not present

## 2024-03-02 ENCOUNTER — Other Ambulatory Visit: Payer: Self-pay | Admitting: *Deleted

## 2024-03-02 NOTE — Patient Outreach (Signed)
 Lisa Larson resides in [facility].  Screening for potential complex care management services as benefit of health plan and primary care provider.  Telephonic collaborative meeting with [facility] discharge planner. Wilkie Aye reports Lisa Larson will transition to [facility], in [place], next week. States family has been working with [employer] in finding placement.   No identifiable care management needs.  Raiford Noble, MSN, RN, BSN [place]  [facility] RN Post- Acute Care Manager Direct Dial: [phone]

## 2024-03-08 DIAGNOSIS — F0280 Dementia in other diseases classified elsewhere without behavioral disturbance: Secondary | ICD-10-CM | POA: Diagnosis not present

## 2024-03-08 DIAGNOSIS — M542 Cervicalgia: Secondary | ICD-10-CM | POA: Diagnosis not present

## 2024-03-08 DIAGNOSIS — R531 Weakness: Secondary | ICD-10-CM | POA: Diagnosis not present

## 2024-03-09 ENCOUNTER — Encounter: Payer: Self-pay | Admitting: Internal Medicine

## 2024-03-10 DIAGNOSIS — M542 Cervicalgia: Secondary | ICD-10-CM | POA: Diagnosis not present

## 2024-03-10 DIAGNOSIS — R531 Weakness: Secondary | ICD-10-CM | POA: Diagnosis not present

## 2024-03-10 DIAGNOSIS — D649 Anemia, unspecified: Secondary | ICD-10-CM | POA: Diagnosis not present

## 2024-03-10 DIAGNOSIS — N182 Chronic kidney disease, stage 2 (mild): Secondary | ICD-10-CM | POA: Diagnosis not present

## 2024-03-15 ENCOUNTER — Other Ambulatory Visit: Payer: Self-pay | Admitting: *Deleted

## 2024-03-15 NOTE — Patient Outreach (Signed)
 Post- Acute Care Manager follow up. Verified in [facility], Mrs. Lisa Larson discharged from [facility] on 03/11/24.  [name] transitioned to [facility] in [place].   No identifiable complex care management needs.   Raiford Noble, MSN, RN, BSN [place]  [facility], [facility] RN Post- Acute Care Manager Direct Dial: [phone]

## 2024-03-28 DIAGNOSIS — M6281 Muscle weakness (generalized): Secondary | ICD-10-CM | POA: Diagnosis not present

## 2024-03-29 DIAGNOSIS — M6281 Muscle weakness (generalized): Secondary | ICD-10-CM | POA: Diagnosis not present

## 2024-03-29 DIAGNOSIS — R278 Other lack of coordination: Secondary | ICD-10-CM | POA: Diagnosis not present

## 2024-03-30 DIAGNOSIS — R278 Other lack of coordination: Secondary | ICD-10-CM | POA: Diagnosis not present

## 2024-03-30 DIAGNOSIS — M6281 Muscle weakness (generalized): Secondary | ICD-10-CM | POA: Diagnosis not present

## 2024-03-31 DIAGNOSIS — R1312 Dysphagia, oropharyngeal phase: Secondary | ICD-10-CM | POA: Diagnosis not present

## 2024-03-31 DIAGNOSIS — G3183 Dementia with Lewy bodies: Secondary | ICD-10-CM | POA: Diagnosis not present

## 2024-03-31 DIAGNOSIS — M6281 Muscle weakness (generalized): Secondary | ICD-10-CM | POA: Diagnosis not present

## 2024-03-31 DIAGNOSIS — R41841 Cognitive communication deficit: Secondary | ICD-10-CM | POA: Diagnosis not present

## 2024-04-05 DIAGNOSIS — M6281 Muscle weakness (generalized): Secondary | ICD-10-CM | POA: Diagnosis not present

## 2024-04-05 DIAGNOSIS — R1312 Dysphagia, oropharyngeal phase: Secondary | ICD-10-CM | POA: Diagnosis not present

## 2024-04-05 DIAGNOSIS — G3183 Dementia with Lewy bodies: Secondary | ICD-10-CM | POA: Diagnosis not present

## 2024-04-05 DIAGNOSIS — R41841 Cognitive communication deficit: Secondary | ICD-10-CM | POA: Diagnosis not present

## 2024-04-06 DIAGNOSIS — F411 Generalized anxiety disorder: Secondary | ICD-10-CM | POA: Diagnosis not present

## 2024-04-06 DIAGNOSIS — G3183 Dementia with Lewy bodies: Secondary | ICD-10-CM | POA: Diagnosis not present

## 2024-04-06 DIAGNOSIS — F331 Major depressive disorder, recurrent, moderate: Secondary | ICD-10-CM | POA: Diagnosis not present

## 2024-04-06 DIAGNOSIS — R278 Other lack of coordination: Secondary | ICD-10-CM | POA: Diagnosis not present

## 2024-04-06 DIAGNOSIS — M6281 Muscle weakness (generalized): Secondary | ICD-10-CM | POA: Diagnosis not present

## 2024-04-06 DIAGNOSIS — F5105 Insomnia due to other mental disorder: Secondary | ICD-10-CM | POA: Diagnosis not present

## 2024-04-07 DIAGNOSIS — M6281 Muscle weakness (generalized): Secondary | ICD-10-CM | POA: Diagnosis not present

## 2024-04-07 DIAGNOSIS — R41841 Cognitive communication deficit: Secondary | ICD-10-CM | POA: Diagnosis not present

## 2024-04-07 DIAGNOSIS — R1312 Dysphagia, oropharyngeal phase: Secondary | ICD-10-CM | POA: Diagnosis not present

## 2024-04-07 DIAGNOSIS — G3183 Dementia with Lewy bodies: Secondary | ICD-10-CM | POA: Diagnosis not present

## 2024-04-12 ENCOUNTER — Ambulatory Visit (HOSPITAL_COMMUNITY): Payer: Medicare Other | Attending: Cardiology

## 2024-04-12 DIAGNOSIS — I5023 Acute on chronic systolic (congestive) heart failure: Secondary | ICD-10-CM | POA: Diagnosis not present

## 2024-04-12 DIAGNOSIS — M6281 Muscle weakness (generalized): Secondary | ICD-10-CM | POA: Diagnosis not present

## 2024-04-12 LAB — ECHOCARDIOGRAM COMPLETE
Est EF: 55
S' Lateral: 3.3 cm

## 2024-04-13 DIAGNOSIS — R278 Other lack of coordination: Secondary | ICD-10-CM | POA: Diagnosis not present

## 2024-04-13 DIAGNOSIS — M6281 Muscle weakness (generalized): Secondary | ICD-10-CM | POA: Diagnosis not present

## 2024-04-14 DIAGNOSIS — M6281 Muscle weakness (generalized): Secondary | ICD-10-CM | POA: Diagnosis not present

## 2024-04-14 DIAGNOSIS — R41841 Cognitive communication deficit: Secondary | ICD-10-CM | POA: Diagnosis not present

## 2024-04-14 DIAGNOSIS — G3183 Dementia with Lewy bodies: Secondary | ICD-10-CM | POA: Diagnosis not present

## 2024-04-14 DIAGNOSIS — R1312 Dysphagia, oropharyngeal phase: Secondary | ICD-10-CM | POA: Diagnosis not present

## 2024-04-14 NOTE — Progress Notes (Unsigned)
error 

## 2024-04-19 DIAGNOSIS — R278 Other lack of coordination: Secondary | ICD-10-CM | POA: Diagnosis not present

## 2024-04-19 DIAGNOSIS — M6281 Muscle weakness (generalized): Secondary | ICD-10-CM | POA: Diagnosis not present
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
## 2024-04-21 DIAGNOSIS — M6281 Muscle weakness (generalized): Secondary | ICD-10-CM | POA: Diagnosis not present

## 2024-04-21 DIAGNOSIS — R278 Other lack of coordination: Secondary | ICD-10-CM | POA: Diagnosis not present

## 2024-04-22 ENCOUNTER — Encounter: Payer: Self-pay | Admitting: Cardiology

## 2024-04-26 DIAGNOSIS — R278 Other lack of coordination: Secondary | ICD-10-CM | POA: Diagnosis not present

## 2024-04-26 DIAGNOSIS — M6281 Muscle weakness (generalized): Secondary | ICD-10-CM | POA: Diagnosis not present

## 2024-04-28 DIAGNOSIS — M6281 Muscle weakness (generalized): Secondary | ICD-10-CM | POA: Diagnosis not present

## 2024-04-28 DIAGNOSIS — R278 Other lack of coordination: Secondary | ICD-10-CM | POA: Diagnosis not present

## 2024-04-29 ENCOUNTER — Ambulatory Visit: Attending: Physician Assistant | Admitting: Physician Assistant

## 2024-04-29 ENCOUNTER — Encounter: Payer: Self-pay | Admitting: Physician Assistant

## 2024-04-29 VITALS — BP 82/50 | HR 72 | Ht 62.0 in

## 2024-04-29 DIAGNOSIS — F02B2 Dementia in other diseases classified elsewhere, moderate, with psychotic disturbance: Secondary | ICD-10-CM | POA: Insufficient documentation

## 2024-04-29 DIAGNOSIS — I3139 Other pericardial effusion (noninflammatory): Secondary | ICD-10-CM | POA: Diagnosis not present

## 2024-04-29 DIAGNOSIS — I4819 Other persistent atrial fibrillation: Secondary | ICD-10-CM | POA: Diagnosis not present

## 2024-04-29 DIAGNOSIS — I959 Hypotension, unspecified: Secondary | ICD-10-CM | POA: Diagnosis not present

## 2024-04-29 DIAGNOSIS — I340 Nonrheumatic mitral (valve) insufficiency: Secondary | ICD-10-CM | POA: Diagnosis not present

## 2024-04-29 DIAGNOSIS — I429 Cardiomyopathy, unspecified: Secondary | ICD-10-CM | POA: Insufficient documentation

## 2024-04-29 DIAGNOSIS — G3183 Dementia with Lewy bodies: Secondary | ICD-10-CM | POA: Diagnosis not present

## 2024-04-29 DIAGNOSIS — R600 Localized edema: Secondary | ICD-10-CM | POA: Diagnosis not present

## 2024-04-29 MED ORDER — MIDODRINE HCL 10 MG PO TABS
10.0000 mg | ORAL_TABLET | Freq: Three times a day (TID) | ORAL | 11 refills | Status: AC
Start: 2024-04-29 — End: ?

## 2024-04-29 NOTE — Addendum Note (Signed)
 Addended by: Dominga Friedman on: 04/29/2024 01:31 PM   Modules accepted: Orders

## 2024-04-29 NOTE — Addendum Note (Signed)
 Addended by: Dominga Friedman on: 04/29/2024 12:08 PM   Modules accepted: Orders

## 2024-04-29 NOTE — Patient Instructions (Signed)
 Medication Instructions:  Increase Midodrine  to 10 mg three times a day with your meals Continue Lasix  20 mg once a day in the morning with feet elevated at all times when patient is not ambulating  *If you need a refill on your cardiac medications before your next appointment, please call your pharmacy*  Lab Work: No labs  Testing/Procedures: Your physician has requested that you have a limited echocardiogram. Echocardiography is a painless test that uses sound waves to create images of your heart. It provides your doctor with information about the size and shape of your heart and how well your heart's chambers and valves are working. This procedure takes approximately one hour. There are no restrictions for this procedure. Please do NOT wear cologne, perfume, aftershave, or lotions (deodorant is allowed). Please arrive 15 minutes prior to your appointment time.  Please note: We ask at that you not bring children with you during ultrasound (echo/ vascular) testing. Due to room size and safety concerns, children are not allowed in the ultrasound rooms during exams. Our front office staff cannot provide observation of children in our lobby area while testing is being conducted. An adult accompanying a patient to their appointment will only be allowed in the ultrasound room at the discretion of the ultrasound technician under special circumstances. We apologize for any inconvenience.  Follow-Up: At [facility], you and your health needs are our priority.  As part of our continuing mission to provide you with exceptional heart care, our providers are all part of one team.  This team includes your primary Cardiologist (physician) and Advanced Practice Providers or APPs (Physician Assistants and Nurse Practitioners) who all work together to provide you with the care you need, when you need it.  Your next appointment:   3-4 week(s)  Provider:   Needs to be with Kardie Tobb, DO   We  recommend signing up for the patient portal called "MyChart".  Sign up information is provided on this After Visit Summary.  MyChart is used to connect with patients for Virtual Visits (Telemedicine).  Patients are able to view lab/test results, encounter notes, upcoming appointments, etc.  Non-urgent messages can be sent to your provider as well.   To learn more about what you can do with MyChart, go to [email or web address].   Other Instructions: It is imperative that patient keeps feet elevated at all time when not ambulating Patient needs to go to the ER if the have worsening shortness of breath or chest pain concerning for worsening pericardial effusion.

## 2024-04-30 DIAGNOSIS — M79661 Pain in right lower leg: Secondary | ICD-10-CM | POA: Diagnosis not present

## 2024-04-30 DIAGNOSIS — M25551 Pain in right hip: Secondary | ICD-10-CM | POA: Diagnosis not present

## 2024-04-30 DIAGNOSIS — M25561 Pain in right knee: Secondary | ICD-10-CM | POA: Diagnosis not present

## 2024-05-03 DIAGNOSIS — G3183 Dementia with Lewy bodies: Secondary | ICD-10-CM | POA: Diagnosis not present

## 2024-05-03 DIAGNOSIS — R1312 Dysphagia, oropharyngeal phase: Secondary | ICD-10-CM | POA: Diagnosis not present

## 2024-05-03 DIAGNOSIS — R41841 Cognitive communication deficit: Secondary | ICD-10-CM | POA: Diagnosis not present

## 2024-05-03 DIAGNOSIS — M6281 Muscle weakness (generalized): Secondary | ICD-10-CM | POA: Diagnosis not present

## 2024-05-04 DIAGNOSIS — F331 Major depressive disorder, recurrent, moderate: Secondary | ICD-10-CM | POA: Diagnosis not present

## 2024-05-04 DIAGNOSIS — G3183 Dementia with Lewy bodies: Secondary | ICD-10-CM | POA: Diagnosis not present

## 2024-05-04 DIAGNOSIS — F411 Generalized anxiety disorder: Secondary | ICD-10-CM | POA: Diagnosis not present

## 2024-05-04 DIAGNOSIS — M6281 Muscle weakness (generalized): Secondary | ICD-10-CM | POA: Diagnosis not present

## 2024-05-04 DIAGNOSIS — F5105 Insomnia due to other mental disorder: Secondary | ICD-10-CM | POA: Diagnosis not present

## 2024-05-05 DIAGNOSIS — G3183 Dementia with Lewy bodies: Secondary | ICD-10-CM | POA: Diagnosis not present

## 2024-05-05 DIAGNOSIS — R1312 Dysphagia, oropharyngeal phase: Secondary | ICD-10-CM | POA: Diagnosis not present

## 2024-05-05 DIAGNOSIS — R41841 Cognitive communication deficit: Secondary | ICD-10-CM | POA: Diagnosis not present

## 2024-05-06 ENCOUNTER — Ambulatory Visit (HOSPITAL_COMMUNITY): Attending: Cardiology

## 2024-05-06 DIAGNOSIS — I3139 Other pericardial effusion (noninflammatory): Secondary | ICD-10-CM | POA: Diagnosis not present

## 2024-05-06 DIAGNOSIS — R278 Other lack of coordination: Secondary | ICD-10-CM | POA: Diagnosis not present

## 2024-05-06 DIAGNOSIS — M6281 Muscle weakness (generalized): Secondary | ICD-10-CM | POA: Diagnosis not present

## 2024-05-06 LAB — ECHOCARDIOGRAM LIMITED
Calc EF: 56.8 %
S' Lateral: 2.6 cm
Single Plane A2C EF: 57.2 %
Single Plane A4C EF: 58.3 %

## 2024-05-10 DIAGNOSIS — M6281 Muscle weakness (generalized): Secondary | ICD-10-CM | POA: Diagnosis not present

## 2024-05-11 DIAGNOSIS — M6281 Muscle weakness (generalized): Secondary | ICD-10-CM | POA: Diagnosis not present

## 2024-05-12 DIAGNOSIS — R41841 Cognitive communication deficit: Secondary | ICD-10-CM | POA: Diagnosis not present

## 2024-05-12 DIAGNOSIS — G3183 Dementia with Lewy bodies: Secondary | ICD-10-CM | POA: Diagnosis not present

## 2024-05-12 DIAGNOSIS — M6281 Muscle weakness (generalized): Secondary | ICD-10-CM | POA: Diagnosis not present

## 2024-05-12 DIAGNOSIS — R278 Other lack of coordination: Secondary | ICD-10-CM | POA: Diagnosis not present

## 2024-05-12 DIAGNOSIS — R1312 Dysphagia, oropharyngeal phase: Secondary | ICD-10-CM | POA: Diagnosis not present

## 2024-05-13 DIAGNOSIS — G3183 Dementia with Lewy bodies: Secondary | ICD-10-CM | POA: Diagnosis not present

## 2024-05-13 DIAGNOSIS — R1312 Dysphagia, oropharyngeal phase: Secondary | ICD-10-CM | POA: Diagnosis not present

## 2024-05-13 DIAGNOSIS — R41841 Cognitive communication deficit: Secondary | ICD-10-CM | POA: Diagnosis not present

## 2024-05-17 DIAGNOSIS — R41841 Cognitive communication deficit: Secondary | ICD-10-CM | POA: Diagnosis not present

## 2024-05-17 DIAGNOSIS — G3183 Dementia with Lewy bodies: Secondary | ICD-10-CM | POA: Diagnosis not present

## 2024-05-17 DIAGNOSIS — M6281 Muscle weakness (generalized): Secondary | ICD-10-CM | POA: Diagnosis not present

## 2024-05-17 DIAGNOSIS — R1312 Dysphagia, oropharyngeal phase: Secondary | ICD-10-CM | POA: Diagnosis not present

## 2024-05-18 DIAGNOSIS — M6281 Muscle weakness (generalized): Secondary | ICD-10-CM | POA: Diagnosis not present

## 2024-05-18 DIAGNOSIS — R278 Other lack of coordination: Secondary | ICD-10-CM | POA: Diagnosis not present

## 2024-05-19 DIAGNOSIS — M6281 Muscle weakness (generalized): Secondary | ICD-10-CM | POA: Diagnosis not present

## 2024-05-19 DIAGNOSIS — R41841 Cognitive communication deficit: Secondary | ICD-10-CM | POA: Diagnosis not present

## 2024-05-19 DIAGNOSIS — G3183 Dementia with Lewy bodies: Secondary | ICD-10-CM | POA: Diagnosis not present

## 2024-05-19 DIAGNOSIS — R278 Other lack of coordination: Secondary | ICD-10-CM | POA: Diagnosis not present

## 2024-05-19 DIAGNOSIS — R1312 Dysphagia, oropharyngeal phase: Secondary | ICD-10-CM | POA: Diagnosis not present

## 2024-05-20 DIAGNOSIS — F331 Major depressive disorder, recurrent, moderate: Secondary | ICD-10-CM | POA: Diagnosis not present

## 2024-05-20 DIAGNOSIS — R278 Other lack of coordination: Secondary | ICD-10-CM | POA: Diagnosis not present

## 2024-05-20 DIAGNOSIS — M6281 Muscle weakness (generalized): Secondary | ICD-10-CM | POA: Diagnosis not present

## 2024-05-20 DIAGNOSIS — F411 Generalized anxiety disorder: Secondary | ICD-10-CM | POA: Diagnosis not present

## 2024-05-24 DIAGNOSIS — M6281 Muscle weakness (generalized): Secondary | ICD-10-CM | POA: Diagnosis not present

## 2024-05-25 DIAGNOSIS — R41841 Cognitive communication deficit: Secondary | ICD-10-CM | POA: Diagnosis not present

## 2024-05-25 DIAGNOSIS — G3183 Dementia with Lewy bodies: Secondary | ICD-10-CM | POA: Diagnosis not present

## 2024-05-25 DIAGNOSIS — R1312 Dysphagia, oropharyngeal phase: Secondary | ICD-10-CM | POA: Diagnosis not present

## 2024-05-25 DIAGNOSIS — M6281 Muscle weakness (generalized): Secondary | ICD-10-CM | POA: Diagnosis not present

## 2024-05-26 DIAGNOSIS — R278 Other lack of coordination: Secondary | ICD-10-CM | POA: Diagnosis not present

## 2024-05-26 DIAGNOSIS — M25572 Pain in left ankle and joints of left foot: Secondary | ICD-10-CM | POA: Diagnosis not present

## 2024-05-26 DIAGNOSIS — G3183 Dementia with Lewy bodies: Secondary | ICD-10-CM | POA: Diagnosis not present

## 2024-05-26 DIAGNOSIS — R41841 Cognitive communication deficit: Secondary | ICD-10-CM | POA: Diagnosis not present

## 2024-05-26 DIAGNOSIS — M6281 Muscle weakness (generalized): Secondary | ICD-10-CM | POA: Diagnosis not present

## 2024-05-26 DIAGNOSIS — R1312 Dysphagia, oropharyngeal phase: Secondary | ICD-10-CM | POA: Diagnosis not present

## 2024-05-26 DIAGNOSIS — M25571 Pain in right ankle and joints of right foot: Secondary | ICD-10-CM | POA: Diagnosis not present
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
## 2024-05-30 ENCOUNTER — Encounter: Payer: Self-pay | Admitting: Physician Assistant

## 2024-05-30 ENCOUNTER — Ambulatory Visit: Attending: Physician Assistant | Admitting: Physician Assistant

## 2024-05-30 VITALS — BP 112/70 | HR 74 | Ht 60.0 in | Wt 120.0 lb

## 2024-05-30 DIAGNOSIS — I3139 Other pericardial effusion (noninflammatory): Secondary | ICD-10-CM | POA: Diagnosis not present

## 2024-05-30 DIAGNOSIS — I429 Cardiomyopathy, unspecified: Secondary | ICD-10-CM | POA: Diagnosis not present

## 2024-05-30 DIAGNOSIS — I340 Nonrheumatic mitral (valve) insufficiency: Secondary | ICD-10-CM | POA: Diagnosis not present

## 2024-05-30 DIAGNOSIS — I4819 Other persistent atrial fibrillation: Secondary | ICD-10-CM | POA: Diagnosis not present

## 2024-05-30 DIAGNOSIS — G3183 Dementia with Lewy bodies: Secondary | ICD-10-CM | POA: Insufficient documentation

## 2024-05-30 DIAGNOSIS — F02B2 Dementia in other diseases classified elsewhere, moderate, with psychotic disturbance: Secondary | ICD-10-CM | POA: Diagnosis not present

## 2024-05-30 DIAGNOSIS — R600 Localized edema: Secondary | ICD-10-CM | POA: Diagnosis not present

## 2024-05-30 NOTE — Patient Instructions (Signed)
 Medication Instructions:  Your physician recommends that you continue on your current medications as directed. Please refer to the Current Medication list given to you today.  *If you need a refill on your cardiac medications before your next appointment, please call your pharmacy*  Lab Work: NONE ordered at this time of appointment   Testing/Procedures: NONE ordered at this time of appointment    Follow-Up: At [facility], you and your health needs are our priority.  As part of our continuing mission to provide you with exceptional heart care, our providers are all part of one team.  This team includes your primary Cardiologist (physician) and Advanced Practice Providers or APPs (Physician Assistants and Nurse Practitioners) who all work together to provide you with the care you need, when you need it.  Your next appointment:    4-6 months (Dr. Emmette Harms) MD only  Provider:   Kardie Tobb, DO    We recommend signing up for the patient portal called "MyChart".  Sign up information is provided on this After Visit Summary.  MyChart is used to connect with patients for Virtual Visits (Telemedicine).  Patients are able to view lab/test results, encounter notes, upcoming appointments, etc.  Non-urgent messages can be sent to your provider as well.   To learn more about what you can do with MyChart, go to [email or web address].   Other Instructions Support Stockings with zipper 15-20 mmHG  recommended today.

## 2024-05-31 DIAGNOSIS — M6281 Muscle weakness (generalized): Secondary | ICD-10-CM | POA: Diagnosis not present

## 2024-05-31 DIAGNOSIS — G3183 Dementia with Lewy bodies: Secondary | ICD-10-CM | POA: Diagnosis not present

## 2024-05-31 DIAGNOSIS — R1312 Dysphagia, oropharyngeal phase: Secondary | ICD-10-CM | POA: Diagnosis not present

## 2024-05-31 DIAGNOSIS — R41841 Cognitive communication deficit: Secondary | ICD-10-CM | POA: Diagnosis not present

## 2024-05-31 DIAGNOSIS — R278 Other lack of coordination: Secondary | ICD-10-CM | POA: Diagnosis not present

## 2024-06-01 ENCOUNTER — Other Ambulatory Visit (HOSPITAL_COMMUNITY)

## 2024-06-01 DIAGNOSIS — G3183 Dementia with Lewy bodies: Secondary | ICD-10-CM | POA: Diagnosis not present

## 2024-06-01 DIAGNOSIS — F411 Generalized anxiety disorder: Secondary | ICD-10-CM | POA: Diagnosis not present

## 2024-06-01 DIAGNOSIS — F331 Major depressive disorder, recurrent, moderate: Secondary | ICD-10-CM | POA: Diagnosis not present

## 2024-06-06 DIAGNOSIS — J302 Other seasonal allergic rhinitis: Secondary | ICD-10-CM | POA: Diagnosis not present

## 2024-06-06 DIAGNOSIS — K5901 Slow transit constipation: Secondary | ICD-10-CM | POA: Diagnosis not present

## 2024-06-06 DIAGNOSIS — J449 Chronic obstructive pulmonary disease, unspecified: Secondary | ICD-10-CM | POA: Diagnosis not present

## 2024-06-06 DIAGNOSIS — I10 Essential (primary) hypertension: Secondary | ICD-10-CM | POA: Diagnosis not present

## 2024-06-10 DIAGNOSIS — J449 Chronic obstructive pulmonary disease, unspecified: Secondary | ICD-10-CM | POA: Diagnosis not present

## 2024-06-10 DIAGNOSIS — I10 Essential (primary) hypertension: Secondary | ICD-10-CM | POA: Diagnosis not present

## 2024-06-10 DIAGNOSIS — D519 Vitamin B12 deficiency anemia, unspecified: Secondary | ICD-10-CM | POA: Diagnosis not present

## 2024-06-10 DIAGNOSIS — E782 Mixed hyperlipidemia: Secondary | ICD-10-CM | POA: Diagnosis not present

## 2024-06-20 DIAGNOSIS — Z79899 Other long term (current) drug therapy: Secondary | ICD-10-CM | POA: Diagnosis not present

## 2024-06-20 DIAGNOSIS — E782 Mixed hyperlipidemia: Secondary | ICD-10-CM | POA: Diagnosis not present

## 2024-06-20 DIAGNOSIS — D519 Vitamin B12 deficiency anemia, unspecified: Secondary | ICD-10-CM | POA: Diagnosis not present

## 2024-06-20 DIAGNOSIS — R7309 Other abnormal glucose: Secondary | ICD-10-CM | POA: Diagnosis not present

## 2024-06-20 DIAGNOSIS — N390 Urinary tract infection, site not specified: Secondary | ICD-10-CM | POA: Diagnosis not present

## 2024-06-20 DIAGNOSIS — E038 Other specified hypothyroidism: Secondary | ICD-10-CM | POA: Diagnosis not present

## 2024-06-20 DIAGNOSIS — E559 Vitamin D deficiency, unspecified: Secondary | ICD-10-CM | POA: Diagnosis not present

## 2024-06-20 DIAGNOSIS — R197 Diarrhea, unspecified: Secondary | ICD-10-CM | POA: Diagnosis not present

## 2024-06-20 DIAGNOSIS — L89312 Pressure ulcer of right buttock, stage 2: Secondary | ICD-10-CM | POA: Diagnosis not present

## 2024-06-21 ENCOUNTER — Emergency Department

## 2024-06-21 ENCOUNTER — Encounter: Payer: Self-pay | Admitting: Emergency Medicine

## 2024-06-21 ENCOUNTER — Emergency Department
Admission: EM | Admit: 2024-06-21 | Discharge: 2024-06-21 | Disposition: A | Discharge status: Skilled Nursing Facility | Attending: Emergency Medicine | Admitting: Emergency Medicine

## 2024-06-21 ENCOUNTER — Other Ambulatory Visit: Payer: Self-pay

## 2024-06-21 DIAGNOSIS — F0390 Unspecified dementia without behavioral disturbance: Secondary | ICD-10-CM | POA: Insufficient documentation

## 2024-06-21 DIAGNOSIS — S3992XA Unspecified injury of lower back, initial encounter: Secondary | ICD-10-CM | POA: Diagnosis not present

## 2024-06-21 DIAGNOSIS — I10 Essential (primary) hypertension: Secondary | ICD-10-CM | POA: Diagnosis not present

## 2024-06-21 DIAGNOSIS — M6281 Muscle weakness (generalized): Secondary | ICD-10-CM | POA: Diagnosis not present

## 2024-06-21 DIAGNOSIS — M5030 Other cervical disc degeneration, unspecified cervical region: Secondary | ICD-10-CM | POA: Diagnosis not present

## 2024-06-21 DIAGNOSIS — W19XXXA Unspecified fall, initial encounter: Secondary | ICD-10-CM | POA: Insufficient documentation

## 2024-06-21 DIAGNOSIS — J449 Chronic obstructive pulmonary disease, unspecified: Secondary | ICD-10-CM | POA: Diagnosis not present

## 2024-06-21 DIAGNOSIS — S299XXA Unspecified injury of thorax, initial encounter: Secondary | ICD-10-CM | POA: Diagnosis not present

## 2024-06-21 DIAGNOSIS — M533 Sacrococcygeal disorders, not elsewhere classified: Secondary | ICD-10-CM | POA: Insufficient documentation

## 2024-06-21 DIAGNOSIS — G8929 Other chronic pain: Secondary | ICD-10-CM | POA: Insufficient documentation

## 2024-06-21 DIAGNOSIS — R296 Repeated falls: Secondary | ICD-10-CM | POA: Diagnosis not present

## 2024-06-21 DIAGNOSIS — R0902 Hypoxemia: Secondary | ICD-10-CM | POA: Diagnosis not present

## 2024-06-21 DIAGNOSIS — R9082 White matter disease, unspecified: Secondary | ICD-10-CM | POA: Diagnosis not present

## 2024-06-21 DIAGNOSIS — M4316 Spondylolisthesis, lumbar region: Secondary | ICD-10-CM | POA: Diagnosis not present

## 2024-06-21 DIAGNOSIS — S0990XA Unspecified injury of head, initial encounter: Secondary | ICD-10-CM | POA: Diagnosis not present

## 2024-06-21 DIAGNOSIS — M542 Cervicalgia: Secondary | ICD-10-CM | POA: Insufficient documentation

## 2024-06-21 NOTE — Discharge Instructions (Addendum)
 CT scans were obtained of the head as well as of the entire spine, all of which were negative for acute fractures or other trauma.  Lisa Larson should follow-up with her primary care provider.  Return to the ER for any new or worsening symptoms that are concerning.

## 2024-06-21 NOTE — ED Triage Notes (Signed)
 Pt BIB EMS for evaluation of unwitnessed fall.  Pt states she didn't fall and was sitting down to look at the gears on a wheelchair.  Pt complains of tailbone pain.

## 2024-06-21 NOTE — ED Provider Notes (Signed)
 [facility] Provider Note    None    (approximate)   History   Fall   HPI  Lisa Larson is a 83 y.o. female with a history of COPD, atrial fibrillation, hypertension, GERD, osteoporosis, and DJD who presents with son for possible fall.  Per EMS, the patient was found at her facility on the floor.  The patient insists she did not fall although admits that she has dementia.  She states that she was working and was on the floor checking on the gears on a wheelchair that she thought was malfunctioning.  She reports chronic neck pain and neuralgia from a C2 fracture.  She reports acute pain to her tailbone.  Reviewed the past medical records.  The patient was admitted to the hospitalist service in January with acute hypoxemic respiratory failure due to influenza A.   Physical Exam   Triage Vital Signs: ED Triage Vitals  Encounter Vitals Group     BP      Girls Systolic BP Percentile      Girls Diastolic BP Percentile      Boys Systolic BP Percentile      Boys Diastolic BP Percentile      Pulse      Resp      Temp      Temp src      SpO2      Weight      Height      Head Circumference      Peak Flow      Pain Score      Pain Loc      Pain Education      Exclude from Growth Chart     Most recent vital signs: Vitals:   06/21/24 0830 06/21/24 1031  BP: 129/84   Pulse: (!) 141   Resp:    Temp:  97.6 F (36.4 C)  SpO2: (!) 80%      General: Awake, no distress.  CV:  Good peripheral perfusion.  Resp:  Normal effort.  Abd:  No distention.  Other:  Mild cervical and lumbosacral midline tenderness with no step-off or crepitus.  No thoracic midline tenderness.  EOMI.  PERRLA.  Normal speech.  No facial droop.  Motor intact in all extremities.   ED Results / Procedures / Treatments   Labs (all labs ordered are listed, but only abnormal results are displayed) Labs Reviewed - No data to display   EKG  ED ECG REPORT I, Waylon Cassis, the attending physician, personally viewed and interpreted this ECG.  Date: 06/21/2024 EKG Time: 1200 Rate: 75 Rhythm: Atrial fibrillation QRS Axis: normal Intervals: normal ST/T Wave abnormalities: Nonspecific T wave abnormalities Narrative Interpretation: no evidence of acute ischemia    RADIOLOGY  CT head: I independently viewed and interpreted the images; there is no ICH.  CT cervical spine: No acute fracture.  Chronic dens fracture unchanged from prior imaging.  CT thoracic spine: No acute fracture  CT lumbar spine: No acute fracture    PROCEDURES:  Critical Care performed: No  Procedures   MEDICATIONS ORDERED IN ED: Medications - No data to display   IMPRESSION / MDM / ASSESSMENT AND PLAN / ED COURSE  I reviewed the triage vital signs and the nursing notes.  83 year old female with PMH as noted above presents after she was found on the floor after an apparent fall.  However, the patient denies a fall.  There is no significant trauma on exam  although he does report some neck pain and some acute lumbosacral pain.  Neurologic exam is nonfocal.  Differential diagnosis includes, but is not limited to, minor head injury, concussion, ICH contusion, fracture.  Patient's presentation is most consistent with acute complicated illness / injury requiring diagnostic workup.  The patient is on the cardiac monitor to evaluate for evidence of arrhythmia and/or significant heart rate changes.  ----------------------------------------- 12:17 PM on 06/21/2024 -----------------------------------------  Imaging is all negative for acute traumatic findings.  The patient has a chronic dens fracture which is unchanged from prior imaging.  There was a spurious recording of her vital signs at 830 with a heart rate of 141 and O2 saturation of 80.  When I reassessed the patient her O2 saturation is in the high 90s on room air and her heart rate is in the 70s.  EKG confirms a  normal heart rate.  The patient remains without acute symptoms.  She is stable for discharge back to her facility.  Return precautions given, she expresses understanding.   FINAL CLINICAL IMPRESSION(S) / ED DIAGNOSES   Final diagnoses:  Fall, initial encounter     Rx / DC Orders   ED Discharge Orders     None        Note:  This document was prepared using Dragon voice recognition software and may include unintentional dictation errors.    Jacolyn Pae, MD 06/21/24 1219

## 2024-06-23 DIAGNOSIS — B9620 Unspecified Escherichia coli [E. coli] as the cause of diseases classified elsewhere: Secondary | ICD-10-CM | POA: Diagnosis not present

## 2024-06-23 DIAGNOSIS — R296 Repeated falls: Secondary | ICD-10-CM | POA: Diagnosis not present

## 2024-06-23 DIAGNOSIS — N390 Urinary tract infection, site not specified: Secondary | ICD-10-CM | POA: Diagnosis not present

## 2024-06-27 DIAGNOSIS — R296 Repeated falls: Secondary | ICD-10-CM | POA: Diagnosis not present

## 2024-06-27 DIAGNOSIS — R7989 Other specified abnormal findings of blood chemistry: Secondary | ICD-10-CM | POA: Diagnosis not present

## 2024-06-27 DIAGNOSIS — N390 Urinary tract infection, site not specified: Secondary | ICD-10-CM | POA: Diagnosis not present

## 2024-06-27 DIAGNOSIS — B9620 Unspecified Escherichia coli [E. coli] as the cause of diseases classified elsewhere: Secondary | ICD-10-CM | POA: Diagnosis not present

## 2024-07-04 DIAGNOSIS — L89312 Pressure ulcer of right buttock, stage 2: Secondary | ICD-10-CM | POA: Diagnosis not present

## 2024-07-04 DIAGNOSIS — I10 Essential (primary) hypertension: Secondary | ICD-10-CM | POA: Diagnosis not present

## 2024-07-04 DIAGNOSIS — J449 Chronic obstructive pulmonary disease, unspecified: Secondary | ICD-10-CM | POA: Diagnosis not present

## 2024-07-04 DIAGNOSIS — N183 Chronic kidney disease, stage 3 unspecified: Secondary | ICD-10-CM | POA: Diagnosis not present

## 2024-07-19 DIAGNOSIS — R945 Abnormal results of liver function studies: Secondary | ICD-10-CM | POA: Diagnosis not present

## 2024-07-19 DIAGNOSIS — R944 Abnormal results of kidney function studies: Secondary | ICD-10-CM | POA: Diagnosis not present

## 2024-07-19 DIAGNOSIS — J449 Chronic obstructive pulmonary disease, unspecified: Secondary | ICD-10-CM | POA: Diagnosis not present

## 2024-07-19 DIAGNOSIS — E782 Mixed hyperlipidemia: Secondary | ICD-10-CM | POA: Diagnosis not present

## 2024-07-19 DIAGNOSIS — R55 Syncope and collapse: Secondary | ICD-10-CM | POA: Diagnosis not present

## 2024-07-19 DIAGNOSIS — I10 Essential (primary) hypertension: Secondary | ICD-10-CM | POA: Diagnosis not present

## 2024-07-19 DIAGNOSIS — D519 Vitamin B12 deficiency anemia, unspecified: Secondary | ICD-10-CM | POA: Diagnosis not present

## 2024-07-25 DIAGNOSIS — N183 Chronic kidney disease, stage 3 unspecified: Secondary | ICD-10-CM | POA: Diagnosis not present

## 2024-07-25 DIAGNOSIS — N261 Atrophy of kidney (terminal): Secondary | ICD-10-CM | POA: Diagnosis not present

## 2024-08-03 ENCOUNTER — Ambulatory Visit: Payer: Medicare Other

## 2024-08-03 ENCOUNTER — Ambulatory Visit

## 2024-08-04 ENCOUNTER — Ambulatory Visit

## 2024-08-04 DIAGNOSIS — G3183 Dementia with Lewy bodies: Secondary | ICD-10-CM | POA: Diagnosis not present

## 2024-08-04 DIAGNOSIS — D519 Vitamin B12 deficiency anemia, unspecified: Secondary | ICD-10-CM | POA: Diagnosis not present

## 2024-08-04 DIAGNOSIS — I10 Essential (primary) hypertension: Secondary | ICD-10-CM | POA: Diagnosis not present

## 2024-08-04 DIAGNOSIS — N189 Chronic kidney disease, unspecified: Secondary | ICD-10-CM | POA: Diagnosis not present

## 2024-08-04 DIAGNOSIS — J449 Chronic obstructive pulmonary disease, unspecified: Secondary | ICD-10-CM | POA: Diagnosis not present

## 2024-08-04 DIAGNOSIS — E782 Mixed hyperlipidemia: Secondary | ICD-10-CM | POA: Diagnosis not present

## 2024-08-30 DIAGNOSIS — G3183 Dementia with Lewy bodies: Secondary | ICD-10-CM | POA: Diagnosis not present

## 2024-08-30 DIAGNOSIS — I10 Essential (primary) hypertension: Secondary | ICD-10-CM | POA: Diagnosis not present

## 2024-08-30 DIAGNOSIS — G629 Polyneuropathy, unspecified: Secondary | ICD-10-CM | POA: Diagnosis not present

## 2024-08-30 DIAGNOSIS — J449 Chronic obstructive pulmonary disease, unspecified: Secondary | ICD-10-CM | POA: Diagnosis not present

## 2024-09-06 DIAGNOSIS — J449 Chronic obstructive pulmonary disease, unspecified: Secondary | ICD-10-CM | POA: Diagnosis not present

## 2024-09-06 DIAGNOSIS — N189 Chronic kidney disease, unspecified: Secondary | ICD-10-CM | POA: Diagnosis not present

## 2024-09-06 DIAGNOSIS — I10 Essential (primary) hypertension: Secondary | ICD-10-CM | POA: Diagnosis not present

## 2024-09-06 DIAGNOSIS — G3183 Dementia with Lewy bodies: Secondary | ICD-10-CM | POA: Diagnosis not present

## 2024-09-12 DIAGNOSIS — D519 Vitamin B12 deficiency anemia, unspecified: Secondary | ICD-10-CM | POA: Diagnosis not present

## 2024-09-12 DIAGNOSIS — J449 Chronic obstructive pulmonary disease, unspecified: Secondary | ICD-10-CM | POA: Diagnosis not present

## 2024-09-12 DIAGNOSIS — I10 Essential (primary) hypertension: Secondary | ICD-10-CM | POA: Diagnosis not present

## 2024-09-12 DIAGNOSIS — E782 Mixed hyperlipidemia: Secondary | ICD-10-CM | POA: Diagnosis not present

## 2024-09-16 DIAGNOSIS — R012 Other cardiac sounds: Secondary | ICD-10-CM | POA: Diagnosis not present

## 2024-09-16 DIAGNOSIS — R011 Cardiac murmur, unspecified: Secondary | ICD-10-CM | POA: Diagnosis not present

## 2024-09-30 ENCOUNTER — Ambulatory Visit (INDEPENDENT_AMBULATORY_CARE_PROVIDER_SITE_OTHER)

## 2024-09-30 VITALS — BP 100/70 | HR 93 | Ht 60.0 in | Wt 119.0 lb

## 2024-09-30 DIAGNOSIS — I10 Essential (primary) hypertension: Secondary | ICD-10-CM | POA: Diagnosis not present

## 2024-09-30 DIAGNOSIS — G3183 Dementia with Lewy bodies: Secondary | ICD-10-CM | POA: Diagnosis not present

## 2024-09-30 DIAGNOSIS — J449 Chronic obstructive pulmonary disease, unspecified: Secondary | ICD-10-CM | POA: Diagnosis not present

## 2024-09-30 DIAGNOSIS — Z0000 Encounter for general adult medical examination without abnormal findings: Secondary | ICD-10-CM | POA: Diagnosis not present

## 2024-09-30 DIAGNOSIS — I4891 Unspecified atrial fibrillation: Secondary | ICD-10-CM | POA: Diagnosis not present
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
## 2024-09-30 NOTE — Patient Instructions (Addendum)
 Lisa Larson,  Thank you for taking the time for your Medicare Wellness Visit. I appreciate your continued commitment to your health goals. Please review the care plan we discussed, and feel free to reach out if I can assist you further.  Medicare recommends these wellness visits once per year to help you and your care team stay ahead of potential health issues. These visits are designed to focus on prevention, allowing your provider to concentrate on managing your acute and chronic conditions during your regular appointments.  Please note that Annual Wellness Visits do not include a physical exam. Some assessments may be limited, especially if the visit was conducted virtually. If needed, we may recommend a separate in-person follow-up with your provider.  Ongoing Care Seeing your primary care provider every 3 to 6 months helps us  monitor your health and provide consistent, personalized care. Last office visit on 11/11/2023.  You are due for a flu vaccine and a tetanus vaccine.  Referrals If a referral was made during today's visit and you haven't received any updates within two weeks, please contact the referred provider directly to check on the status.  Recommended Screenings:  Health Maintenance  Topic Date Due   DEXA scan (bone density measurement)  12/11/2018   DTaP/Tdap/Td vaccine (2 - Td or Tdap) 04/05/2023   Flu Shot  07/29/2024   Medicare Annual Wellness Visit  08/02/2024   Colon Cancer Screening  08/02/2027   Pneumococcal Vaccine for age over 63  Completed   HPV Vaccine  Aged Out   Meningitis B Vaccine  Aged Out   COVID-19 Vaccine  Discontinued   Zoster (Shingles) Vaccine  Discontinued       09/30/2024    2:05 PM  Advanced Directives  Does Patient Have a Medical Advance Directive? Yes  Type of Estate agent of [place];Living will  Copy of Healthcare Power of Attorney in Chart? No - copy requested   Advance Care Planning is important because  it: Ensures you receive medical care that aligns with your values, goals, and preferences. Provides guidance to your family and loved ones, reducing the emotional burden of decision-making during critical moments.  Vision: Annual vision screenings are recommended for early detection of glaucoma, cataracts, and diabetic retinopathy. These exams can also reveal signs of chronic conditions such as diabetes and high blood pressure.  Dental: Annual dental screenings help detect early signs of oral cancer, gum disease, and other conditions linked to overall health, including heart disease and diabetes.  Please see the attached documents for additional preventive care recommendations.

## 2024-10-04 DIAGNOSIS — G3183 Dementia with Lewy bodies: Secondary | ICD-10-CM | POA: Diagnosis not present

## 2024-10-04 DIAGNOSIS — J449 Chronic obstructive pulmonary disease, unspecified: Secondary | ICD-10-CM | POA: Diagnosis not present

## 2024-10-04 DIAGNOSIS — J8281 Chronic eosinophilic pneumonia: Secondary | ICD-10-CM | POA: Diagnosis not present

## 2024-10-04 DIAGNOSIS — I4891 Unspecified atrial fibrillation: Secondary | ICD-10-CM | POA: Diagnosis not present

## 2024-10-11 DIAGNOSIS — J449 Chronic obstructive pulmonary disease, unspecified: Secondary | ICD-10-CM | POA: Diagnosis not present

## 2024-10-11 DIAGNOSIS — E782 Mixed hyperlipidemia: Secondary | ICD-10-CM | POA: Diagnosis not present

## 2024-10-11 DIAGNOSIS — D519 Vitamin B12 deficiency anemia, unspecified: Secondary | ICD-10-CM | POA: Diagnosis not present

## 2024-10-11 DIAGNOSIS — I10 Essential (primary) hypertension: Secondary | ICD-10-CM | POA: Diagnosis not present

## 2024-11-07 DIAGNOSIS — R7309 Other abnormal glucose: Secondary | ICD-10-CM | POA: Diagnosis not present

## 2024-11-07 DIAGNOSIS — E038 Other specified hypothyroidism: Secondary | ICD-10-CM | POA: Diagnosis not present

## 2024-11-07 DIAGNOSIS — E782 Mixed hyperlipidemia: Secondary | ICD-10-CM | POA: Diagnosis not present

## 2024-11-07 DIAGNOSIS — D519 Vitamin B12 deficiency anemia, unspecified: Secondary | ICD-10-CM | POA: Diagnosis not present

## 2024-11-07 DIAGNOSIS — Z79899 Other long term (current) drug therapy: Secondary | ICD-10-CM | POA: Diagnosis not present

## 2024-11-08 ENCOUNTER — Ambulatory Visit

## 2024-11-08 VITALS — BP 116/70 | HR 52 | Temp 98.5°F | Ht 60.0 in | Wt 121.0 lb

## 2024-11-08 DIAGNOSIS — J8281 Chronic eosinophilic pneumonia: Secondary | ICD-10-CM | POA: Diagnosis not present

## 2024-11-08 DIAGNOSIS — J4550 Severe persistent asthma, uncomplicated: Secondary | ICD-10-CM

## 2024-11-08 DIAGNOSIS — R001 Bradycardia, unspecified: Secondary | ICD-10-CM | POA: Diagnosis not present

## 2024-11-08 DIAGNOSIS — I4891 Unspecified atrial fibrillation: Secondary | ICD-10-CM | POA: Diagnosis not present

## 2024-11-08 DIAGNOSIS — I959 Hypotension, unspecified: Secondary | ICD-10-CM | POA: Diagnosis not present
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
## 2024-11-08 NOTE — Patient Instructions (Signed)
 Dear Ms. Zaley,   No changes on your therapy for now. If you start having shortness of breath, please send me a my chart message to send the inhaler to your pharmacy.   I will talk to the pharmacist to check your Nucala  refill.  I will see you in 6 months.

## 2024-11-09 NOTE — Addendum Note (Signed)
 Addended by: SANCHEZ FERNANDEZ, Jamai Dolce L on: 11/09/2024 02:16 PM   Modules accepted: Orders

## 2024-11-11 ENCOUNTER — Telehealth: Payer: Self-pay

## 2024-11-11 DIAGNOSIS — J4550 Severe persistent asthma, uncomplicated: Secondary | ICD-10-CM

## 2024-11-11 NOTE — Telephone Encounter (Signed)
 Received referral for Nucala  PAP renewal - patient currently on Nucala . Will need to complete Nucala  PAP re-enrollment.

## 2024-11-11 NOTE — Telephone Encounter (Signed)
 error

## 2024-11-14 DIAGNOSIS — I10 Essential (primary) hypertension: Secondary | ICD-10-CM | POA: Diagnosis not present

## 2024-11-14 DIAGNOSIS — D519 Vitamin B12 deficiency anemia, unspecified: Secondary | ICD-10-CM | POA: Diagnosis not present

## 2024-11-14 DIAGNOSIS — E782 Mixed hyperlipidemia: Secondary | ICD-10-CM | POA: Diagnosis not present

## 2024-11-14 DIAGNOSIS — J449 Chronic obstructive pulmonary disease, unspecified: Secondary | ICD-10-CM | POA: Diagnosis not present

## 2024-11-15 DIAGNOSIS — D72829 Elevated white blood cell count, unspecified: Secondary | ICD-10-CM | POA: Diagnosis not present

## 2024-11-15 DIAGNOSIS — R7989 Other specified abnormal findings of blood chemistry: Secondary | ICD-10-CM | POA: Diagnosis not present

## 2024-11-15 DIAGNOSIS — E038 Other specified hypothyroidism: Secondary | ICD-10-CM | POA: Diagnosis not present

## 2024-11-15 DIAGNOSIS — I4891 Unspecified atrial fibrillation: Secondary | ICD-10-CM | POA: Diagnosis not present

## 2024-11-18 NOTE — Telephone Encounter (Signed)
 Patient enrolled into asthma grant through PAF: ID: [id number] BIN: [id number] PCN: [id number] Group: [id number] For pharmacy inquiries, contact PDMI at [phone]. For patient inquiries, contact PAF at [phone].  Award amount: $2000  Rx can be sent to [facility] in 2026. MyChart message snet to patient.  Submitted a Prior Authorization request to [facility] for NUCALA  via CoverMyMeds. Will update once we receive a response.  Key: [id number]

## 2024-11-20 NOTE — Telephone Encounter (Signed)
 Received notification from [employer] regarding a prior authorization for NUCALA . Authorization has been APPROVED from 11/18/2024 until further notice. Approval letter sent to scan center.  Authorization # [id number]  Sherry Pennant, PharmD, MPH, BCPS, CPP Clinical Pharmacist [facility])

## 2024-11-21 DIAGNOSIS — Z79899 Other long term (current) drug therapy: Secondary | ICD-10-CM | POA: Diagnosis not present

## 2024-12-05 DIAGNOSIS — R7309 Other abnormal glucose: Secondary | ICD-10-CM | POA: Diagnosis not present

## 2024-12-05 DIAGNOSIS — E782 Mixed hyperlipidemia: Secondary | ICD-10-CM | POA: Diagnosis not present

## 2024-12-05 DIAGNOSIS — D519 Vitamin B12 deficiency anemia, unspecified: Secondary | ICD-10-CM | POA: Diagnosis not present

## 2024-12-05 DIAGNOSIS — E038 Other specified hypothyroidism: Secondary | ICD-10-CM | POA: Diagnosis not present

## 2024-12-05 DIAGNOSIS — Z79899 Other long term (current) drug therapy: Secondary | ICD-10-CM | POA: Diagnosis not present

## 2024-12-05 MED ORDER — NUCALA 100 MG/ML ~~LOC~~ SOAJ: 100.0000 mg | SUBCUTANEOUS | 0 refills | Status: DC

## 2024-12-05 MED ORDER — NUCALA 100 MG/ML ~~LOC~~ SOAJ: 100.0000 mg | SUBCUTANEOUS | 3 refills | Status: DC

## 2024-12-05 NOTE — Telephone Encounter (Signed)
 Renewal for Nucala  sent to [facility] mail order.   Starting 2026, can be filled with [facility].   MyChart message to patient for awareness.

## 2024-12-06 ENCOUNTER — Other Ambulatory Visit: Payer: Self-pay

## 2024-12-06 NOTE — Progress Notes (Signed)
 Faxed orders for refill of Nucala  to patient assistance program. Called patient assistance program to confirm fax number. Per [place] with Nucala  PAP, direct fax number: [phone].   Aleck Puls, PharmD, BCPS, CPP Clinical Pharmacist  [facility]  [facility]

## 2024-12-13 DIAGNOSIS — J8281 Chronic eosinophilic pneumonia: Secondary | ICD-10-CM | POA: Diagnosis not present

## 2024-12-13 DIAGNOSIS — R7989 Other specified abnormal findings of blood chemistry: Secondary | ICD-10-CM | POA: Diagnosis not present

## 2024-12-13 DIAGNOSIS — I10 Essential (primary) hypertension: Secondary | ICD-10-CM | POA: Diagnosis not present

## 2024-12-13 DIAGNOSIS — I4891 Unspecified atrial fibrillation: Secondary | ICD-10-CM | POA: Diagnosis not present

## 2024-12-13 NOTE — Progress Notes (Signed)
 Re-faxed orders to Nucala  at [phone] for refills.

## 2024-12-28 NOTE — Addendum Note (Signed)
 Addended by: DAYNE SHERRY RAMAN on: 12/28/2024 02:26 PM   Modules accepted: Orders

## 2024-12-28 NOTE — Telephone Encounter (Signed)
 Referral to pharmacotherapy clinic placed.

## 2024-12-30 ENCOUNTER — Ambulatory Visit

## 2024-12-30 ENCOUNTER — Other Ambulatory Visit (HOSPITAL_COMMUNITY): Payer: Self-pay

## 2024-12-30 DIAGNOSIS — J4550 Severe persistent asthma, uncomplicated: Secondary | ICD-10-CM

## 2024-12-30 MED ORDER — NUCALA 100 MG/ML ~~LOC~~ SOAJ: 100.0000 mg | SUBCUTANEOUS | 3 refills | Status: DC

## 2024-12-30 NOTE — Telephone Encounter (Signed)
 Test claim shows copay of $2072.31 foNucala 
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
## 2025-01-11 ENCOUNTER — Other Ambulatory Visit (HOSPITAL_COMMUNITY): Payer: Self-pay

## 2025-01-11 NOTE — Telephone Encounter (Signed)
 Received fax from [employer] stating pt was approved for PAP until 12/28/25. Pt can choose to continue filling through PAP for no charge or fill through [employer] using grant. If she uses grant she will get very close to reaching her oop max for the year and all of her prescriptions will be no charge. Sent message to pt to see which option is better for her. Will await pt's response.

## 2025-01-17 ENCOUNTER — Other Ambulatory Visit (HOSPITAL_COMMUNITY): Payer: Self-pay

## 2025-01-17 ENCOUNTER — Other Ambulatory Visit: Payer: Self-pay

## 2025-01-20 ENCOUNTER — Other Ambulatory Visit: Payer: Self-pay

## 2025-01-20 ENCOUNTER — Other Ambulatory Visit (HOSPITAL_COMMUNITY): Payer: Self-pay

## 2025-01-20 MED ORDER — NUCALA 100 MG/ML ~~LOC~~ SOAJ
100.0000 mg | SUBCUTANEOUS | 3 refills | Status: AC
  Filled 2025-01-20: qty 1, 28d supply, fill #0

## 2025-01-20 NOTE — Telephone Encounter (Signed)
 See pharmacotherapy visit note 12/30/24. Counseling completed. Son, Will, prefers shipment to his house.

## 2025-01-20 NOTE — Progress Notes (Signed)
 Specialty Pharmacy Initial Fill Coordination Note  Lisa Larson is a 84 y.o. female contacted today regarding initial fill of specialty medication(s) Mepolizumab  (Nucala )   Patient requested Delivery   Delivery date: 02/01/25   Verified address: [address] [place], [place] [address]   Medication will be filled on: 01/30/25   Patient is aware of $56.69 copayment.    **Please put Attention Will on package if possible**

## 2025-01-20 NOTE — Addendum Note (Signed)
 Addended by: Callan Norden L on: 01/20/2025 11:10 AM   Modules accepted: Orders

## 2025-01-31 ENCOUNTER — Other Ambulatory Visit: Payer: Self-pay
